# Patient Record
Sex: Female | Born: 1965 | Race: Black or African American | Hispanic: No | Marital: Single | Smoking: Current every day smoker
Health system: Southern US, Community
[De-identification: ages and names within clinical notes are randomized; demographics above are authoritative.]

## PROBLEM LIST (undated history)

## (undated) DIAGNOSIS — E785 Hyperlipidemia, unspecified: Secondary | ICD-10-CM

## (undated) DIAGNOSIS — I219 Acute myocardial infarction, unspecified: Secondary | ICD-10-CM

## (undated) DIAGNOSIS — F1011 Alcohol abuse, in remission: Secondary | ICD-10-CM

## (undated) DIAGNOSIS — Z87442 Personal history of urinary calculi: Secondary | ICD-10-CM

## (undated) DIAGNOSIS — Z720 Tobacco use: Secondary | ICD-10-CM

## (undated) DIAGNOSIS — Z978 Presence of other specified devices: Secondary | ICD-10-CM

## (undated) DIAGNOSIS — K219 Gastro-esophageal reflux disease without esophagitis: Secondary | ICD-10-CM

## (undated) DIAGNOSIS — F419 Anxiety disorder, unspecified: Secondary | ICD-10-CM

## (undated) DIAGNOSIS — D649 Anemia, unspecified: Secondary | ICD-10-CM

## (undated) DIAGNOSIS — R011 Cardiac murmur, unspecified: Secondary | ICD-10-CM

## (undated) DIAGNOSIS — T884XXA Failed or difficult intubation, initial encounter: Secondary | ICD-10-CM

## (undated) DIAGNOSIS — F329 Major depressive disorder, single episode, unspecified: Secondary | ICD-10-CM

## (undated) DIAGNOSIS — N200 Calculus of kidney: Secondary | ICD-10-CM

## (undated) DIAGNOSIS — K5900 Constipation, unspecified: Secondary | ICD-10-CM

## (undated) DIAGNOSIS — I2510 Atherosclerotic heart disease of native coronary artery without angina pectoris: Secondary | ICD-10-CM

## (undated) DIAGNOSIS — F32A Depression, unspecified: Secondary | ICD-10-CM

## (undated) DIAGNOSIS — F319 Bipolar disorder, unspecified: Secondary | ICD-10-CM

## (undated) DIAGNOSIS — I10 Essential (primary) hypertension: Secondary | ICD-10-CM

## (undated) HISTORY — PX: TUBAL LIGATION: SHX77

## (undated) HISTORY — DX: Hyperlipidemia, unspecified: E78.5

## (undated) HISTORY — PX: CARDIAC CATHETERIZATION: SHX172

## (undated) HISTORY — DX: Gastro-esophageal reflux disease without esophagitis: K21.9

## (undated) HISTORY — PX: CORONARY ANGIOPLASTY WITH STENT PLACEMENT: SHX49

## (undated) HISTORY — DX: Alcohol abuse, in remission: F10.11

## (undated) HISTORY — DX: Atherosclerotic heart disease of native coronary artery without angina pectoris: I25.10

## (undated) HISTORY — DX: Bipolar disorder, unspecified: F31.9

## (undated) HISTORY — DX: Essential (primary) hypertension: I10

## (undated) HISTORY — DX: Presence of other specified devices: Z97.8

## (undated) HISTORY — DX: Tobacco use: Z72.0

---

## 2005-10-03 ENCOUNTER — Emergency Department (HOSPITAL_COMMUNITY): Admission: EM | Admit: 2005-10-03 | Discharge: 2005-10-04 | Payer: Self-pay | Admitting: Emergency Medicine

## 2009-07-02 ENCOUNTER — Emergency Department (HOSPITAL_COMMUNITY): Admission: EM | Admit: 2009-07-02 | Discharge: 2009-07-02 | Payer: Self-pay | Admitting: Family Medicine

## 2009-09-23 ENCOUNTER — Emergency Department (HOSPITAL_COMMUNITY): Admission: EM | Admit: 2009-09-23 | Discharge: 2009-09-23 | Payer: Self-pay | Admitting: Emergency Medicine

## 2009-11-09 ENCOUNTER — Emergency Department (HOSPITAL_COMMUNITY): Admission: EM | Admit: 2009-11-09 | Discharge: 2009-11-09 | Payer: Self-pay | Admitting: Emergency Medicine

## 2010-02-08 DIAGNOSIS — I219 Acute myocardial infarction, unspecified: Secondary | ICD-10-CM

## 2010-02-08 HISTORY — DX: Acute myocardial infarction, unspecified: I21.9

## 2010-03-01 ENCOUNTER — Encounter: Payer: Self-pay | Admitting: Obstetrics

## 2010-03-01 ENCOUNTER — Emergency Department (HOSPITAL_COMMUNITY)
Admission: EM | Admit: 2010-03-01 | Discharge: 2010-03-01 | Payer: Self-pay | Source: Home / Self Care | Admitting: Emergency Medicine

## 2010-03-03 LAB — DIFFERENTIAL
Basophils Absolute: 0 10*3/uL (ref 0.0–0.1)
Basophils Relative: 1 % (ref 0–1)
Eosinophils Absolute: 0.2 10*3/uL (ref 0.0–0.7)
Eosinophils Relative: 3 % (ref 0–5)
Lymphocytes Relative: 26 % (ref 12–46)
Lymphs Abs: 1.7 10*3/uL (ref 0.7–4.0)
Monocytes Absolute: 0.5 10*3/uL (ref 0.1–1.0)
Monocytes Relative: 8 % (ref 3–12)
Neutro Abs: 4.1 10*3/uL (ref 1.7–7.7)
Neutrophils Relative %: 63 % (ref 43–77)

## 2010-03-03 LAB — CBC
HCT: 36.5 % (ref 36.0–46.0)
Hemoglobin: 11.6 g/dL — ABNORMAL LOW (ref 12.0–15.0)
MCH: 27.7 pg (ref 26.0–34.0)
MCHC: 31.8 g/dL (ref 30.0–36.0)
MCV: 87.1 fL (ref 78.0–100.0)
Platelets: 353 10*3/uL (ref 150–400)
RBC: 4.19 MIL/uL (ref 3.87–5.11)
RDW: 14.8 % (ref 11.5–15.5)
WBC: 6.6 10*3/uL (ref 4.0–10.5)

## 2010-08-27 ENCOUNTER — Emergency Department (HOSPITAL_COMMUNITY): Payer: Self-pay

## 2010-08-27 ENCOUNTER — Emergency Department (HOSPITAL_COMMUNITY)
Admission: EM | Admit: 2010-08-27 | Discharge: 2010-08-27 | Disposition: A | Discharge status: Home / Self Care | Payer: Self-pay | Attending: Emergency Medicine | Admitting: Emergency Medicine

## 2010-08-27 DIAGNOSIS — K2990 Gastroduodenitis, unspecified, without bleeding: Secondary | ICD-10-CM | POA: Insufficient documentation

## 2010-08-27 DIAGNOSIS — K2970 Gastritis, unspecified, without bleeding: Secondary | ICD-10-CM | POA: Insufficient documentation

## 2010-08-27 DIAGNOSIS — R079 Chest pain, unspecified: Secondary | ICD-10-CM | POA: Insufficient documentation

## 2010-08-27 DIAGNOSIS — R1013 Epigastric pain: Secondary | ICD-10-CM | POA: Insufficient documentation

## 2010-08-27 DIAGNOSIS — I10 Essential (primary) hypertension: Secondary | ICD-10-CM | POA: Insufficient documentation

## 2010-08-27 DIAGNOSIS — F988 Other specified behavioral and emotional disorders with onset usually occurring in childhood and adolescence: Secondary | ICD-10-CM | POA: Insufficient documentation

## 2010-08-27 DIAGNOSIS — Z79899 Other long term (current) drug therapy: Secondary | ICD-10-CM | POA: Insufficient documentation

## 2010-08-27 DIAGNOSIS — F319 Bipolar disorder, unspecified: Secondary | ICD-10-CM | POA: Insufficient documentation

## 2010-08-27 LAB — CBC
HCT: 35.3 % — ABNORMAL LOW (ref 36.0–46.0)
Hemoglobin: 11.7 g/dL — ABNORMAL LOW (ref 12.0–15.0)
MCH: 27.3 pg (ref 26.0–34.0)
MCHC: 33.1 g/dL (ref 30.0–36.0)
MCV: 82.5 fL (ref 78.0–100.0)
Platelets: 299 10*3/uL (ref 150–400)
RBC: 4.28 MIL/uL (ref 3.87–5.11)
RDW: 16 % — ABNORMAL HIGH (ref 11.5–15.5)
WBC: 6.7 10*3/uL (ref 4.0–10.5)

## 2010-08-27 LAB — DIFFERENTIAL
Basophils Absolute: 0 10*3/uL (ref 0.0–0.1)
Basophils Relative: 0 % (ref 0–1)
Eosinophils Absolute: 0.2 10*3/uL (ref 0.0–0.7)
Eosinophils Relative: 3 % (ref 0–5)
Lymphocytes Relative: 25 % (ref 12–46)
Lymphs Abs: 1.6 10*3/uL (ref 0.7–4.0)
Monocytes Absolute: 0.6 10*3/uL (ref 0.1–1.0)
Monocytes Relative: 9 % (ref 3–12)
Neutro Abs: 4.2 10*3/uL (ref 1.7–7.7)
Neutrophils Relative %: 64 % (ref 43–77)

## 2010-08-27 LAB — CK TOTAL AND CKMB (NOT AT ARMC)
CK, MB: 1.3 ng/mL (ref 0.3–4.0)
Relative Index: INVALID (ref 0.0–2.5)
Total CK: 95 U/L (ref 7–177)

## 2010-08-27 LAB — POCT I-STAT, CHEM 8
BUN: 8 mg/dL (ref 6–23)
Calcium, Ion: 1.24 mmol/L (ref 1.12–1.32)
Chloride: 106 mEq/L (ref 96–112)
Creatinine, Ser: 0.7 mg/dL (ref 0.50–1.10)
Glucose, Bld: 96 mg/dL (ref 70–99)
HCT: 38 % (ref 36.0–46.0)
Hemoglobin: 12.9 g/dL (ref 12.0–15.0)
Potassium: 3.9 mEq/L (ref 3.5–5.1)
Sodium: 138 mEq/L (ref 135–145)
TCO2: 21 mmol/L (ref 0–100)

## 2010-08-27 LAB — HEPATIC FUNCTION PANEL
ALT: 9 U/L (ref 0–35)
AST: 15 U/L (ref 0–37)
Albumin: 3.4 g/dL — ABNORMAL LOW (ref 3.5–5.2)
Alkaline Phosphatase: 60 U/L (ref 39–117)
Bilirubin, Direct: 0.1 mg/dL (ref 0.0–0.3)
Indirect Bilirubin: 0.1 mg/dL — ABNORMAL LOW (ref 0.3–0.9)
Total Bilirubin: 0.2 mg/dL — ABNORMAL LOW (ref 0.3–1.2)
Total Protein: 7.1 g/dL (ref 6.0–8.3)

## 2010-08-27 LAB — TROPONIN I: Troponin I: <0.3 ng/mL (ref <0.30)

## 2010-08-27 LAB — LIPASE, BLOOD: Lipase: 52 U/L (ref 11–59)

## 2010-08-27 LAB — POCT PREGNANCY, URINE: Preg Test, Ur: NEGATIVE

## 2010-09-01 ENCOUNTER — Emergency Department (HOSPITAL_COMMUNITY)
Admission: EM | Admit: 2010-09-01 | Discharge: 2010-09-02 | Disposition: A | Discharge status: Other Acute Inpatient Hospital | Payer: Self-pay | Attending: Emergency Medicine | Admitting: Emergency Medicine

## 2010-09-01 DIAGNOSIS — R45851 Suicidal ideations: Secondary | ICD-10-CM | POA: Insufficient documentation

## 2010-09-01 DIAGNOSIS — Z79899 Other long term (current) drug therapy: Secondary | ICD-10-CM | POA: Insufficient documentation

## 2010-09-01 DIAGNOSIS — I10 Essential (primary) hypertension: Secondary | ICD-10-CM | POA: Insufficient documentation

## 2010-09-01 DIAGNOSIS — F319 Bipolar disorder, unspecified: Secondary | ICD-10-CM | POA: Insufficient documentation

## 2010-09-01 LAB — DIFFERENTIAL
Basophils Absolute: 0 10*3/uL (ref 0.0–0.1)
Basophils Relative: 0 % (ref 0–1)
Eosinophils Absolute: 0.2 10*3/uL (ref 0.0–0.7)
Eosinophils Relative: 2 % (ref 0–5)
Lymphocytes Relative: 29 % (ref 12–46)
Lymphs Abs: 2.3 10*3/uL (ref 0.7–4.0)
Monocytes Absolute: 0.5 10*3/uL (ref 0.1–1.0)
Monocytes Relative: 7 % (ref 3–12)
Neutro Abs: 4.9 10*3/uL (ref 1.7–7.7)
Neutrophils Relative %: 61 % (ref 43–77)

## 2010-09-01 LAB — CBC
HCT: 38.8 % (ref 36.0–46.0)
Hemoglobin: 12.8 g/dL (ref 12.0–15.0)
MCH: 27.4 pg (ref 26.0–34.0)
MCHC: 33 g/dL (ref 30.0–36.0)
MCV: 83.1 fL (ref 78.0–100.0)
Platelets: 344 10*3/uL (ref 150–400)
RBC: 4.67 MIL/uL (ref 3.87–5.11)
RDW: 16.1 % — ABNORMAL HIGH (ref 11.5–15.5)
WBC: 7.9 10*3/uL (ref 4.0–10.5)

## 2010-09-01 LAB — URINALYSIS, ROUTINE W REFLEX MICROSCOPIC
Glucose, UA: NEGATIVE mg/dL
Nitrite: NEGATIVE
Protein, ur: 30 mg/dL — AB
Specific Gravity, Urine: 1.023 (ref 1.005–1.030)
Urobilinogen, UA: 0.2 mg/dL (ref 0.0–1.0)
pH: 5.5 (ref 5.0–8.0)

## 2010-09-01 LAB — URINE MICROSCOPIC-ADD ON

## 2010-09-01 LAB — RAPID URINE DRUG SCREEN, HOSP PERFORMED
Amphetamines: NOT DETECTED
Barbiturates: POSITIVE — AB
Benzodiazepines: POSITIVE — AB
Cocaine: NOT DETECTED
Opiates: NOT DETECTED
Tetrahydrocannabinol: NOT DETECTED

## 2010-09-01 LAB — BASIC METABOLIC PANEL
BUN: 7 mg/dL (ref 6–23)
CO2: 23 mEq/L (ref 19–32)
Calcium: 9.5 mg/dL (ref 8.4–10.5)
Chloride: 103 mEq/L (ref 96–112)
Creatinine, Ser: 0.72 mg/dL (ref 0.50–1.10)
GFR calc Af Amer: >60 mL/min (ref >60)
GFR calc non Af Amer: >60 mL/min (ref >60)
Glucose, Bld: 95 mg/dL (ref 70–99)
Potassium: 3.8 mEq/L (ref 3.5–5.1)
Sodium: 136 mEq/L (ref 135–145)

## 2010-09-01 LAB — ETHANOL: Alcohol, Ethyl (B): <11 mg/dL (ref 0–11)

## 2010-09-01 LAB — POCT PREGNANCY, URINE: Preg Test, Ur: NEGATIVE

## 2010-09-02 ENCOUNTER — Inpatient Hospital Stay (HOSPITAL_COMMUNITY)
Admission: AD | Admit: 2010-09-02 | Discharge: 2010-09-08 | DRG: 897 | Disposition: A | Discharge status: Home / Self Care | Payer: Self-pay | Source: Ambulatory Visit | Attending: Psychiatry | Admitting: Psychiatry

## 2010-09-02 DIAGNOSIS — I10 Essential (primary) hypertension: Secondary | ICD-10-CM

## 2010-09-02 DIAGNOSIS — F3289 Other specified depressive episodes: Secondary | ICD-10-CM

## 2010-09-02 DIAGNOSIS — R45851 Suicidal ideations: Secondary | ICD-10-CM

## 2010-09-02 DIAGNOSIS — F329 Major depressive disorder, single episode, unspecified: Secondary | ICD-10-CM

## 2010-09-02 DIAGNOSIS — F1320 Sedative, hypnotic or anxiolytic dependence, uncomplicated: Secondary | ICD-10-CM

## 2010-09-02 DIAGNOSIS — F1120 Opioid dependence, uncomplicated: Principal | ICD-10-CM

## 2010-09-02 DIAGNOSIS — N390 Urinary tract infection, site not specified: Secondary | ICD-10-CM

## 2010-09-02 DIAGNOSIS — F1994 Other psychoactive substance use, unspecified with psychoactive substance-induced mood disorder: Secondary | ICD-10-CM

## 2010-09-03 ENCOUNTER — Inpatient Hospital Stay (HOSPITAL_COMMUNITY): Payer: Self-pay

## 2010-09-03 ENCOUNTER — Ambulatory Visit (HOSPITAL_COMMUNITY)
Admission: EM | Admit: 2010-09-03 | Discharge: 2010-09-03 | Disposition: A | Discharge status: Home / Self Care | Payer: Self-pay | Source: Other Acute Inpatient Hospital | Attending: Emergency Medicine | Admitting: Emergency Medicine

## 2010-09-03 DIAGNOSIS — F1910 Other psychoactive substance abuse, uncomplicated: Secondary | ICD-10-CM

## 2010-09-03 DIAGNOSIS — M47817 Spondylosis without myelopathy or radiculopathy, lumbosacral region: Secondary | ICD-10-CM | POA: Insufficient documentation

## 2010-09-03 DIAGNOSIS — F319 Bipolar disorder, unspecified: Secondary | ICD-10-CM | POA: Insufficient documentation

## 2010-09-03 DIAGNOSIS — F1994 Other psychoactive substance use, unspecified with psychoactive substance-induced mood disorder: Secondary | ICD-10-CM

## 2010-09-03 DIAGNOSIS — I10 Essential (primary) hypertension: Secondary | ICD-10-CM | POA: Insufficient documentation

## 2010-09-03 DIAGNOSIS — M545 Low back pain, unspecified: Secondary | ICD-10-CM | POA: Insufficient documentation

## 2010-09-03 DIAGNOSIS — F1320 Sedative, hypnotic or anxiolytic dependence, uncomplicated: Secondary | ICD-10-CM

## 2010-09-03 DIAGNOSIS — F1120 Opioid dependence, uncomplicated: Secondary | ICD-10-CM

## 2010-09-03 DIAGNOSIS — Z79899 Other long term (current) drug therapy: Secondary | ICD-10-CM | POA: Insufficient documentation

## 2010-09-03 DIAGNOSIS — Z87442 Personal history of urinary calculi: Secondary | ICD-10-CM | POA: Insufficient documentation

## 2010-09-03 LAB — URINE CULTURE
Colony Count: NO GROWTH
Culture  Setup Time: 201207250405
Culture: NO GROWTH

## 2010-09-04 NOTE — Assessment & Plan Note (Signed)
[name], [name]             ACCOUNT NO.:  [id number]  MEDICAL RECORD NO.:  [id number]  LOCATION:  0301                          FACILITY:  [facility]  PHYSICIAN:  Franchot Gallo, MD     DATE OF BIRTH:  07-23-1965  DATE OF ADMISSION:  09/02/2010 DATE OF DISCHARGE:  09/03/2010                      PSYCHIATRIC ADMISSION ASSESSMENT   IDENTIFYING INFORMATION:  This is a 45 year old female that was involuntary petitioned on September 02, 2010.  Her petition papers stated the patient has been off medications for 3 weeks, feeling that she is useless and told her children that they would be better off without her, very depressed and having suicidal thoughts to cut her wrist.  The patient states that her trigger was that she had her power cut off.  She had called her brothers who told her that "they did not have a sister" which got her very upset.  She went to a bathroom and had a box cutter. Her daughter called the police.  She also reports using Vicodin and Percocet for approximately 1 year and has been using sleeping aids for several years.  She also has been starting to use Xanax.  PAST PSYCHIATRIC HISTORY:  First admission to [facility]. Reports a history of bipolar, has been on medications approximately 20 years, is currently a client of [facility].  SOCIAL HISTORY:  This is a 45 year old single female.  She resides in [place] with her two children ages 64 and 61.  She also has a 3-year- old granddaughter that lives her.  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  FAMILY HISTORY:  None.  ALCOHOL/DRUG HISTORY:  She reports being sober from alcohol for 15 years.  Denies any IV drug use, but has been using Vicodin, Percocet and Xanax.  PRIMARY CARE PROVIDER:  None.  MEDICAL PROBLEMS:  Denies any acute or chronic health issues.  She currently however, has a urinary tract infection and was  prescribed Macrodantin in the emergency room.  MEDICATION: 1. Has been on Seroquel 100 mg at bedtime for sleep. 2. Effexor which she stopped due to suicidal thinking. 3. Currently on Lexapro which she states is very beneficial. 4. Strattera that she uses while she attends classes.  DRUG ALLERGIES:  No known allergies.  PHYSICAL EXAMINATION:  This is a normally-developed female.  She is having some back aches and feeling irritable.  Her urine drug screen was positive for benzodiazepines, positive for barbiturates.  Her urine shows 21-30 WBCs.  Her alcohol level was less than 11.  Her BMET was within normal limits.  MENTAL STATUS EXAM:  The patient was resting in bed currently dressed in a hospital scrub, little eye contact and seemed very angry.  At the beginning of the interview she does report that she knows she feels angry and apologizes for behaving like she is.  She tells me that she feels now that it is probably related to her substance use and wanting help.  Her speech is clear.  She became more pleasant and cooperative as the interview progressed.  Thought processes are coherent, goal directed.  Denies any suicidal thoughts or psychotic symptoms.  Her  memory appears intact.  Judgment and insight are fair with improving insight.  AXIS I:  Opiate dependence, benzodiazepine dependence, substance induced mood disorder. AXIS II:  Deferred. AXIS III:  Urinary tract infection. AXIS IV:  Substance use, financial issues, recent problems with electricity being turned off. AXIS V:  40.  Our plan is to put patient on both the clonidine and the Librium protocol.  We will resume her Lexapro and Seroquel for sleep and mood stabilization.  Will continue to assess her motivation for rehab.  The patient is interested at this time going to AA and NA meetings.  Will contact her work.  Her tentative length of stay at this time is 3-4 days.     Landry Corporal,  N.P.   ______________________________ Franchot Gallo, MD    JO/MEDQ  D:  09/04/2010  T:  09/04/2010  Job:  [id number]  Electronically Signed by Limmie PatriciaP. on 09/04/2010 10:53:06 AM Electronically Signed by Franchot Gallo MD on 09/04/2010 04:37:47 PM

## 2010-09-07 LAB — URINALYSIS, MICROSCOPIC ONLY
Bilirubin Urine: NEGATIVE
Glucose, UA: NEGATIVE mg/dL
Hgb urine dipstick: NEGATIVE
Ketones, ur: NEGATIVE mg/dL
Nitrite: NEGATIVE
Protein, ur: NEGATIVE mg/dL
Specific Gravity, Urine: 1.018 (ref 1.005–1.030)
Urobilinogen, UA: 0.2 mg/dL (ref 0.0–1.0)
pH: 5.5 (ref 5.0–8.0)

## 2010-09-08 LAB — URINE CULTURE
Colony Count: NO GROWTH
Culture  Setup Time: 201207300940
Culture: NO GROWTH
Special Requests: POSITIVE

## 2010-09-08 LAB — GC/CHLAMYDIA PROBE AMP, URINE
Chlamydia, Swab/Urine, PCR: NEGATIVE
GC Probe Amp, Urine: NEGATIVE

## 2010-09-24 NOTE — Discharge Summary (Signed)
NAME:  [name], [name]                ACCOUNT NO.:  [id number]  MEDICAL RECORD NO.:  [id number]  LOCATION:  0301                          [facility]ACILITY[facility]  [facility]  PHYSICIAN:  Orson Aloe, MD       DATE OF BIRTH:  [birth year]  DATE OF ADMISSION:  09/02/2010 DATE OF DISCHARGE:  09/08/2010                              DISCHARGE SUMMARY   The patient is a 45 year old female that is involuntary petition on July 25.  Her petition papers stated the patient has been off medications for 3 weeks, feeling that she is useless and told her children that they would be better off without her, very depressed, having suicidal thoughts, cut her wrist.  The patient deemed her triggers were that she had her power cut off.  She called her brother who told her that they did not have a sister which got her very upset.  She went to the bathroom and used a box cutter.  The daughter called the police.  She also reports having Vicodin and Percocet for approximately 1 year, and has been using sleeping aids for several years.  She has been starting to use Xanax.  SIGNIFICANT LABORATORY:  Her urine screen was positive for benzodiazepines and barbiturates.  Her urine shows 21-30 WBCs.  Her alcohol level was less than 11.  Her BMET was within normal limits.  FINAL DIAGNOSES:  Axis I:  Opiate dependence, substance induced mood disorder. Axis II:  Deferred. Axis III:  Urinary tract infection, elevated blood pressure. Axis IV:  Financial issues, moderate. Axis V:  GAF is 60.  SIGNIFICANT FINDINGS ON ADMISSION:  She was noted to be struggling with opiate withdrawal, was placed on a clonidine taper that was noted to be effective.  In the morning groups, she had good participation.  She had been involved in AA and wants to start being involved in NA as well. Possible IOP referral was contemplated.  She was continued on the detox and noted to be doing fairly well.  At one point, she was curled up in bed and not participating  in group stating that she was getting her electricity turned back on, and if the 81-year-old grand-daughter has been taken by sitters to DSS.  She would like to get her electricity turned back on.  She reports that she was able to get to sleep easily. She was told by her roommate that she is a very loud snorer.  She was told this 5 years ago by a person who lived in her house.  She was informed of the consequences of her not getting properly diagnosed for her potential sleep apnea.  She was interested in trying to follow up on that.  She described acid reflux which has responded in the past to Prilosec, and that was restarted.  She was noted to have a white cottage cheese type vaginal discharge, and for that Diflucan was ordered, and having no bowel movements since admission, was placed on Colace with good results noted.  CONDITION ON DISCHARGE:  This is a 45 year old [place] American female from [place], [place].  She denied any suicidal or homicidal ideation, hallucinations, illusions or delusions, had clear coherent thoughts,  and good eye contact, had natural conversational speech with rate, volume and tone, had good recent and remote memory.  Mood on a scale from 1 being the least, 10 the most, is a 1, and anxiety on a scale from 1 the least to 10 the most, is a 2.  ACTIVITY:  Resume typical activities.  DIET:  Resume typical diet.  DISCHARGE INSTRUCTIONS:  Follow up at [facility] on August 2 at 8:00 a.m. and go to the agency on [address] to see if she can get in for her health care followup, health services __________ apartments on [address].  DISCHARGE MEDICATIONS: 1. Lexapro 20 mg. 2. Robaxin 500 mg at bedtime. 3. Naprosyn 500 mg twice a day. 4. Nicotine 21 mg patches for smoking cessation. 5. Nitrofurantoin 50 mg capsules. 6. Macrodantin 4 times a day with meals.          ______________________________ Orson Aloe, MD     EW/MEDQ  D:  09/22/2010  T:   09/23/2010  Job:  [id number]  Electronically Signed by Orson Aloe  on 09/24/2010 12:12:14 PM

## 2010-09-26 ENCOUNTER — Emergency Department (HOSPITAL_COMMUNITY): Payer: Medicaid Other

## 2010-09-26 ENCOUNTER — Inpatient Hospital Stay (HOSPITAL_COMMUNITY)
Admission: EM | Admit: 2010-09-26 | Discharge: 2010-09-30 | DRG: 247 | Disposition: A | Discharge status: Home / Self Care | Payer: Medicaid Other | Attending: Cardiology | Admitting: Cardiology

## 2010-09-26 DIAGNOSIS — E785 Hyperlipidemia, unspecified: Secondary | ICD-10-CM | POA: Diagnosis present

## 2010-09-26 DIAGNOSIS — F319 Bipolar disorder, unspecified: Secondary | ICD-10-CM | POA: Diagnosis present

## 2010-09-26 DIAGNOSIS — F17200 Nicotine dependence, unspecified, uncomplicated: Secondary | ICD-10-CM | POA: Diagnosis present

## 2010-09-26 DIAGNOSIS — K219 Gastro-esophageal reflux disease without esophagitis: Secondary | ICD-10-CM | POA: Diagnosis present

## 2010-09-26 DIAGNOSIS — Z7982 Long term (current) use of aspirin: Secondary | ICD-10-CM

## 2010-09-26 DIAGNOSIS — F1110 Opioid abuse, uncomplicated: Secondary | ICD-10-CM | POA: Diagnosis present

## 2010-09-26 DIAGNOSIS — I214 Non-ST elevation (NSTEMI) myocardial infarction: Principal | ICD-10-CM | POA: Diagnosis present

## 2010-09-26 DIAGNOSIS — F1010 Alcohol abuse, uncomplicated: Secondary | ICD-10-CM | POA: Diagnosis present

## 2010-09-26 DIAGNOSIS — I2510 Atherosclerotic heart disease of native coronary artery without angina pectoris: Secondary | ICD-10-CM | POA: Diagnosis present

## 2010-09-26 DIAGNOSIS — I10 Essential (primary) hypertension: Secondary | ICD-10-CM | POA: Diagnosis present

## 2010-09-26 DIAGNOSIS — R079 Chest pain, unspecified: Secondary | ICD-10-CM

## 2010-09-26 LAB — COMPREHENSIVE METABOLIC PANEL
ALT: 13 U/L (ref 0–35)
AST: 45 U/L — ABNORMAL HIGH (ref 0–37)
Albumin: 3.8 g/dL (ref 3.5–5.2)
Alkaline Phosphatase: 68 U/L (ref 39–117)
BUN: 7 mg/dL (ref 6–23)
CO2: 21 mEq/L (ref 19–32)
Calcium: 9.5 mg/dL (ref 8.4–10.5)
Chloride: 104 mEq/L (ref 96–112)
Creatinine, Ser: 0.67 mg/dL (ref 0.50–1.10)
GFR calc Af Amer: >60 mL/min (ref >60)
GFR calc non Af Amer: >60 mL/min (ref >60)
Glucose, Bld: 104 mg/dL — ABNORMAL HIGH (ref 70–99)
Potassium: 4 mEq/L (ref 3.5–5.1)
Sodium: 136 mEq/L (ref 135–145)
Total Bilirubin: 0.2 mg/dL — ABNORMAL LOW (ref 0.3–1.2)
Total Protein: 7.6 g/dL (ref 6.0–8.3)

## 2010-09-26 LAB — DIFFERENTIAL
Basophils Absolute: 0 10*3/uL (ref 0.0–0.1)
Basophils Relative: 0 % (ref 0–1)
Eosinophils Absolute: 0.1 10*3/uL (ref 0.0–0.7)
Eosinophils Relative: 2 % (ref 0–5)
Lymphocytes Relative: 15 % (ref 12–46)
Lymphs Abs: 1.3 10*3/uL (ref 0.7–4.0)
Monocytes Absolute: 0.7 10*3/uL (ref 0.1–1.0)
Monocytes Relative: 9 % (ref 3–12)
Neutro Abs: 6.1 10*3/uL (ref 1.7–7.7)
Neutrophils Relative %: 74 % (ref 43–77)

## 2010-09-26 LAB — POCT I-STAT TROPONIN I: Troponin i, poc: 2.03 ng/mL (ref 0.00–0.08)

## 2010-09-26 LAB — CBC
HCT: 40.4 % (ref 36.0–46.0)
Hemoglobin: 13.4 g/dL (ref 12.0–15.0)
MCH: 27.5 pg (ref 26.0–34.0)
MCHC: 33.2 g/dL (ref 30.0–36.0)
MCV: 83 fL (ref 78.0–100.0)
Platelets: 314 10*3/uL (ref 150–400)
RBC: 4.87 MIL/uL (ref 3.87–5.11)
RDW: 15.8 % — ABNORMAL HIGH (ref 11.5–15.5)
WBC: 8.2 10*3/uL (ref 4.0–10.5)

## 2010-09-26 LAB — PROTIME-INR
INR: 0.98 (ref 0.00–1.49)
Prothrombin Time: 13.2 seconds (ref 11.6–15.2)

## 2010-09-26 LAB — CARDIAC PANEL(CRET KIN+CKTOT+MB+TROPI)
CK, MB: 232.2 ng/mL (ref 0.3–4.0)
Relative Index: 14.1 — ABNORMAL HIGH (ref 0.0–2.5)
Total CK: 1646 U/L — ABNORMAL HIGH (ref 7–177)
Troponin I: 7.69 ng/mL (ref <0.30)

## 2010-09-26 LAB — CK TOTAL AND CKMB (NOT AT ARMC)
CK, MB: 61.8 ng/mL (ref 0.3–4.0)
Relative Index: 10.8 — ABNORMAL HIGH (ref 0.0–2.5)
Total CK: 572 U/L — ABNORMAL HIGH (ref 7–177)

## 2010-09-26 LAB — TROPONIN I: Troponin I: 3.38 ng/mL (ref <0.30)

## 2010-09-26 LAB — LIPASE, BLOOD: Lipase: 57 U/L (ref 11–59)

## 2010-09-26 LAB — HEPARIN LEVEL (UNFRACTIONATED): Heparin Unfractionated: 0.33 IU/mL (ref 0.30–0.70)

## 2010-09-26 LAB — MRSA PCR SCREENING: MRSA by PCR: NEGATIVE

## 2010-09-26 LAB — APTT: aPTT: 30 seconds (ref 24–37)

## 2010-09-27 LAB — CARDIAC PANEL(CRET KIN+CKTOT+MB+TROPI)
CK, MB: 206.8 ng/mL (ref 0.3–4.0)
CK, MB: 118.5 ng/mL (ref 0.3–4.0)
Relative Index: 11.7 — ABNORMAL HIGH (ref 0.0–2.5)
Relative Index: 8.9 — ABNORMAL HIGH (ref 0.0–2.5)
Total CK: 1761 U/L — ABNORMAL HIGH (ref 7–177)
Total CK: 1334 U/L — ABNORMAL HIGH (ref 7–177)
Troponin I: 20.32 ng/mL (ref <0.30)
Troponin I: 15.95 ng/mL (ref <0.30)

## 2010-09-27 LAB — LIPID PANEL
Cholesterol: 157 mg/dL (ref 0–200)
HDL: 44 mg/dL (ref >39)
LDL Cholesterol: 96 mg/dL (ref 0–99)
Total CHOL/HDL Ratio: 3.6 RATIO
Triglycerides: 85 mg/dL (ref <150)
VLDL: 17 mg/dL (ref 0–40)

## 2010-09-27 LAB — CBC
HCT: 36.2 % (ref 36.0–46.0)
Hemoglobin: 11.8 g/dL — ABNORMAL LOW (ref 12.0–15.0)
MCH: 27.2 pg (ref 26.0–34.0)
MCHC: 32.6 g/dL (ref 30.0–36.0)
MCV: 83.4 fL (ref 78.0–100.0)
Platelets: 303 10*3/uL (ref 150–400)
RBC: 4.34 MIL/uL (ref 3.87–5.11)
RDW: 15.5 % (ref 11.5–15.5)
WBC: 12.8 10*3/uL — ABNORMAL HIGH (ref 4.0–10.5)

## 2010-09-27 LAB — HEPARIN LEVEL (UNFRACTIONATED): Heparin Unfractionated: 0.27 IU/mL — ABNORMAL LOW (ref 0.30–0.70)

## 2010-09-27 LAB — TSH: TSH: 1.017 u[IU]/mL (ref 0.350–4.500)

## 2010-09-28 DIAGNOSIS — I2510 Atherosclerotic heart disease of native coronary artery without angina pectoris: Secondary | ICD-10-CM

## 2010-09-28 LAB — CBC
HCT: 35.7 % — ABNORMAL LOW (ref 36.0–46.0)
Hemoglobin: 11.6 g/dL — ABNORMAL LOW (ref 12.0–15.0)
MCH: 27.2 pg (ref 26.0–34.0)
MCHC: 32.5 g/dL (ref 30.0–36.0)
MCV: 83.6 fL (ref 78.0–100.0)
Platelets: 298 10*3/uL (ref 150–400)
RBC: 4.27 MIL/uL (ref 3.87–5.11)
RDW: 15.5 % (ref 11.5–15.5)
WBC: 16.1 10*3/uL — ABNORMAL HIGH (ref 4.0–10.5)

## 2010-09-28 LAB — POCT ACTIVATED CLOTTING TIME: Activated Clotting Time: 551 seconds

## 2010-09-28 LAB — HEPARIN LEVEL (UNFRACTIONATED): Heparin Unfractionated: 0.19 IU/mL — ABNORMAL LOW (ref 0.30–0.70)

## 2010-09-29 ENCOUNTER — Inpatient Hospital Stay (HOSPITAL_COMMUNITY): Payer: Medicaid Other

## 2010-09-29 DIAGNOSIS — I214 Non-ST elevation (NSTEMI) myocardial infarction: Secondary | ICD-10-CM

## 2010-09-29 DIAGNOSIS — R072 Precordial pain: Secondary | ICD-10-CM

## 2010-09-29 LAB — CBC
HCT: 33 % — ABNORMAL LOW (ref 36.0–46.0)
Hemoglobin: 10.8 g/dL — ABNORMAL LOW (ref 12.0–15.0)
MCH: 27.2 pg (ref 26.0–34.0)
MCHC: 32.7 g/dL (ref 30.0–36.0)
MCV: 83.1 fL (ref 78.0–100.0)
Platelets: 291 10*3/uL (ref 150–400)
RBC: 3.97 MIL/uL (ref 3.87–5.11)
RDW: 15.4 % (ref 11.5–15.5)
WBC: 14.2 10*3/uL — ABNORMAL HIGH (ref 4.0–10.5)

## 2010-09-29 LAB — BASIC METABOLIC PANEL
BUN: 5 mg/dL — ABNORMAL LOW (ref 6–23)
CO2: 24 mEq/L (ref 19–32)
Calcium: 8.8 mg/dL (ref 8.4–10.5)
Chloride: 106 mEq/L (ref 96–112)
Creatinine, Ser: 0.72 mg/dL (ref 0.50–1.10)
GFR calc Af Amer: >60 mL/min (ref >60)
GFR calc non Af Amer: >60 mL/min (ref >60)
Glucose, Bld: 123 mg/dL — ABNORMAL HIGH (ref 70–99)
Potassium: 3.3 mEq/L — ABNORMAL LOW (ref 3.5–5.1)
Sodium: 138 mEq/L (ref 135–145)

## 2010-09-29 NOTE — Cardiovascular Report (Signed)
NAMELashann, [name]                ACCOUNT NO.:  [id number]  MEDICAL RECORD NO.:  [id number]  LOCATION:  6533                         FACILITY:  [facility]  PHYSICIAN:  Verne Carrow, MDDATE OF BIRTH:  1965-10-07  DATE OF PROCEDURE:  09/28/2010 DATE OF DISCHARGE:                           CARDIAC CATHETERIZATION   PRIMARY CARDIOLOGIST:  Maisie Fus C. Wall, MD, [facility]  PROCEDURES PERFORMED: 1. PTCA with placement of a drug-eluting stent in the mid circumflex     artery. 2. PTCA with placement of a drug-eluting stent in the proximal right     coronary artery.  OPERATOR:  Verne Carrow, MD  INDICATIONS:  This is a 45 year old African American female with a history of hypertension, ongoing tobacco abuse, alcohol abuse, bipolar disorder, and substance abuse who was admitted to the hospital with chest pain and ruled in for myocardial infarction with serial cardiac enzymes.  The patient underwent diagnostic catheterization per Dr. Rollene Rotunda earlier today and was found to have a subtotally occluded midcircumflex artery as well as severe stenosis in the proximal right coronary artery.  Dr. Antoine Poche asked me to perform the intervention.  DETAILS OF PROCEDURE:  When I entered the case, the patient had a 5- [place] sheath present in the right radial artery.  Under sterile technique, I changed 5-French sheath for a 6-French sheath.  A 3 mg of verapamil was given through the sheath after the sheath change.  The patient was then given a bolus of Angiomax and the drip was started.  We then engaged the left main artery with an XB3.0 guiding catheter.  When the ACT was greater than 200, I passed a Cougar intracoronary wire down the length of the circumflex artery through the total occlusion and into the obtuse marginal branch.  A 2.0 x 15 mm balloon was used to predilate the lesion.  A 2.25 x 20 mm Promus element drug-eluting stent was carefully positioned and deployed in the mid  circumflex artery, extending down into the second obtuse marginal branch.  A 2.25 x 15 mm balloon was inflated twice within a stented segment.  The stenosis was taken from 100% subtotal occlusion down to 0%.  There was excellent flow into the distal vessel.  At this point, we turned our attention toward the stenosis in the right coronary artery.  A JR-4 guiding catheter was used to selectively engage the native right coronary artery.  A 2.5 x 15 mm balloon was inflated in the proximal segment down into the midportion of the vessel.  A 3.0 x 20 mm Promus Element drug-eluting stent was carefully positioned in the midvessel and was deployed without difficulty.  A 3.25 x 15 mm balloon was inflated twice within the stented segment.  The stenosis was taken from 80% down to 0%.  There was excellent result.  There was excellent flow into the distal vessel.  The patient had no immediate complications.  The sheath was removed here in the cath lab and Terumo hemostasis band was applied over the arteriotomy site.  IMPRESSION: 1. Successful percutaneous transluminal coronary angioplasty with     placement of a drug-eluting stent in the mid circumflex artery. 2. Successful percutaneous transluminal coronary angioplasty with  placement of a drug-eluting stent in the proximal right coronary     artery.  RECOMMENDATIONS:  The patient should be continued on aspirin and Effient for least 1 year.  We will also continue her beta-blocker and statin.     Verne Carrow, MD     CM/MEDQ  D:  09/28/2010  T:  09/29/2010  Job:  [id number]  Electronically Signed by Verne Carrow MD on 09/29/2010 05:09:27 PM

## 2010-09-29 NOTE — Cardiovascular Report (Signed)
  NAMEKaneisha, [name]                ACCOUNT NO.:  [id number]  MEDICAL RECORD NO.:  [id number]  LOCATION:  6533                         FACILITY:  [facility]  PHYSICIAN:  Rollene Rotunda, MD, FACCDATE OF BIRTH:  06/07/1965  DATE OF PROCEDURE:  09/28/2010 DATE OF DISCHARGE:                           CARDIAC CATHETERIZATION   PRIMARY CARE PROVIDER:  [facility].  CARDIOLOGIST:  Jesse Sans. Wall, MD, [facility]  PROCEDURE:  Left heart catheterization/coronary arteriography.  INDICATIONS:  Evaluate patient with non-Q-wave myocardial infarction.  PROCEDURE NOTE:  Left heart catheterization was performed via right radial artery.  The vessel was cannulated using anterior wall puncture. A #5-French arterial sheath was inserted via the modified Seldinger technique.  Preformed Judkins and pigtail catheter were utilized.  The patient tolerated the procedure well and left the lab in stable condition.  RESULTS:  Hemodynamics:  LV 128/10, AO 128/75. Coronaries:  Left main was normal.  The LAD had ostial 25% stenosis due to diffuse luminal irregularities.  There were diffuse 25-30% lesions in the mid and distal vessels.  There was a mid focal 40% lesion right after the takeoff of first diagonal.  First diagonal was small diffuse 30% stenosis.  The circumflex had a mid 99% stenosis with TIMI 2 flow after the first obtuse marginal.  There was some distal moderate disease before small posterolateral as well.  First obtuse marginal was large with proximal 25% stenosis.  Second obtuse marginal was moderate size without high-grade disease.  Posterolateral was small and free of high- grade disease.  The right coronary artery is dominant vessel.  There was proximal 80% stenosis.  There was a tandem 70% lesion.  There were diffuse 25% lesions.  PDA was large with ostial 25% stenosis. Left ventriculogram:  Left ventriculogram was obtained in the RAO projection.  EF was 60%.  CONCLUSION:  Severe two-vessel  coronary artery disease.  Diffuse plaque very advanced for her age.  She does have preserved ejection fraction.  PLAN:  The patient will have percutaneous revascularization, attempted of the circumflex and right coronary artery.  She needs aggressive risk reduction and lifestyle modification.  She needs adherence of medications.  She needs extensive education.     Rollene Rotunda, MD, [facility]     JH/MEDQ  D:  09/28/2010  T:  09/29/2010  Job:  [id number]  cc:   [facility]  Electronically Signed by Rollene Rotunda MD [facility] on 09/29/2010 02:23:44 PM

## 2010-09-30 LAB — URINE CULTURE
Colony Count: NO GROWTH
Culture  Setup Time: 201208220141
Culture: NO GROWTH
Special Requests: NEGATIVE

## 2010-09-30 LAB — URINE MICROSCOPIC-ADD ON

## 2010-09-30 LAB — URINALYSIS, ROUTINE W REFLEX MICROSCOPIC
Bilirubin Urine: NEGATIVE
Glucose, UA: NEGATIVE mg/dL
Ketones, ur: NEGATIVE mg/dL
Nitrite: NEGATIVE
Protein, ur: 30 mg/dL — AB
Specific Gravity, Urine: 1.011 (ref 1.005–1.030)
Urobilinogen, UA: 1 mg/dL (ref 0.0–1.0)
pH: 7 (ref 5.0–8.0)

## 2010-09-30 NOTE — H&P (Signed)
NAME:  [name], [name]                ACCOUNT NO.:  [id number]  MEDICAL RECORD NO.:  [id number]  LOCATION:  MCED                         FACILITY:  [facility]  PHYSICIAN:  Jesse Sans. Manus Weedman, MD, FACCDATE OF BIRTH:  [birth year]  DATE OF ADMISSION:  09/26/2010 DATE OF DISCHARGE:                             HISTORY & PHYSICAL   CHIEF COMPLAINT:  Chest pressure tightness starting yesterday by 3 o'clock in the afternoon.  HISTORY OF PRESENT ILLNESS:  [name] is a 45 year old single African American female with no previous cardiac history.  She does smoke heavily and has a long history of hypertension.  She also has a bipolar disorder and history of substance abuse with opiates and benzodiazepines.  There is remote alcohol abuse history.  She started hurting in her chest on Thursday, 2 days ago.  It was intermittent responding somewhat to some over-the-counter Prilosec and Zantac.  About 3 o'clock yesterday afternoon she began having discomfort again. She basically has consistently hurt since then.  This morning it got worse.  It was pressure heaviness sounding something sitting on her chest that she describes it.  It went up into her neck and down into her shoulder.  There was some queasiness, no vomiting. She became mildly diaphoretic.  She was mildly short of breath.  She is extremely anxious in the ED.  Here she received four baby aspirin and IV nitroglycerin and heparin. When I arrived, she was still having about 4/10 chest pain.  Heart rate was in the 80s, sinus rhythm and her blood pressure was still high.  I have titrated her nitroglycerin up, given her intravenous Lopressor 5 mg x2.  Her pressures came down and her discomfort now is 0-1/10.  Her EKG shows no ST-segment elevation.  Compared to previous EKG in July 2012, she has some ST-segment depression inferiorly with T-wave inversion and also in V6.  Her initial set of markers were positive.  CPK is 572, MB 61.8, index  of 10.8 and her troponin was 3.38.  PT/PTT are normal.  BMET normal. Lipase normal.  CBC normal.  PAST MEDICAL HISTORY:  Her previous medical problems include: 1. Significant hypertension for years. 2. Tobacco abuse ongoing a pack or more per day. 3. History of alcohol abuse. 4. Bipolar disorder. 5. Substance abuse with opiates and benzodiazepines. 6. History of suicidal attempt. 7. GERD. 8. History of a kidney stone.  ALLERGIES:  She is intolerant of VICODIN and CODEINE.  She has been addicted to benzodiazepine in the past but is extremely anxious and requests Ativan in the ED.  There is an ASPIRIN allergy which she disclaims today.  FAMILY HISTORY:  Her mother has hypertension and also mental illness. There is no previous history of coronary artery disease in her family.  MEDICATIONS AT HOME: 1. Lexapro 20 mg p.o. daily. 2. Seroquel unknown dose at this time.  She is not taking anything for     blood pressure.  SOCIAL HISTORY:  She lives alone in [place].  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  Her boyfriend is with her in the room.  She has 2 children.  REVIEW OF SYSTEMS:  Other than  HPI is negative.  She does have a history of reflux but no abdominal pain, no melena, no change in bowel habits.  PHYSICAL EXAMINATION:  GENERAL:  She is an extremely anxious lady in no acute distress.  She is alert and oriented x3. SKIN:  Warm and dry. VITAL SIGNS:  Her blood pressure is 163/97, her pulse was 90 and regular sinus rhythm, respirations 15, sats 100%. HEENT:  Sclerae are injected, slightly protuberant sockets.  Facial symmetry is normal.  Dentition satisfactory. NECK:  Supple.  Carotids upstrokes were equal and bilateral without bruits.  No JVD.  Thyroid is not enlarged.  Trachea is midline. LUNGS:  Clear to auscultation and percussion. HEART:  A nondisplaced PMI.  Regular rate and rhythm.  No murmur, rub or gallop. ABDOMEN:  Soft, good bowel sounds.  No  epigastric tenderness.  No obvious organomegaly. EXTREMITIES:  No cyanosis, clubbing or edema.  Pulses are present. NEURO:  She is very anxious but she is oriented and cooperative.  LABORATORY DATA:  As above.  EKG as above.  Chest x-ray:  Heart size is normal.  No active disease.  ASSESSMENT: 1. Out of hospital non-ST elevation myocardial infarction. 2. Tobacco abuse. 3. History of alcohol abuse remotely in the past. 4. History of severe hypertension and on no medications prior to     admission. 5. Bipolar disorder. 6. History of substance abuse with opiates and benzodiazepines.  PLAN: 1. Admit to step-down. 2. IV nitro and heparin. 3. IV beta blocker given as above and now p.o. metoprolol. 4. Continue aspirin. 5. Tobacco cessation. 6. Fasting lipid panel and TSH. 7. Cath on Monday.  I discussed this at length with her and her significant other and all questions answered.     Juwan Vences C. Daleen Squibb, MD, [facility]     TCW/MEDQ  D:  09/26/2010  T:  09/26/2010  Job:  [id number]  Electronically Signed by Valera Castle MD [facility] on 09/30/2010 06:30:45 PM

## 2010-10-06 LAB — CULTURE, BLOOD (ROUTINE X 2)
Culture  Setup Time: 201208220231
Culture  Setup Time: 201208220231
Culture: NO GROWTH
Culture: NO GROWTH

## 2010-10-14 ENCOUNTER — Encounter: Payer: Self-pay | Admitting: Physician Assistant

## 2010-10-15 ENCOUNTER — Ambulatory Visit (INDEPENDENT_AMBULATORY_CARE_PROVIDER_SITE_OTHER): Payer: Self-pay | Admitting: Physician Assistant

## 2010-10-15 ENCOUNTER — Encounter: Payer: Self-pay | Admitting: Physician Assistant

## 2010-10-15 VITALS — BP 122/78 | HR 62 | Ht 61.0 in | Wt 161.0 lb

## 2010-10-15 DIAGNOSIS — K219 Gastro-esophageal reflux disease without esophagitis: Secondary | ICD-10-CM | POA: Insufficient documentation

## 2010-10-15 DIAGNOSIS — I10 Essential (primary) hypertension: Secondary | ICD-10-CM

## 2010-10-15 DIAGNOSIS — F3130 Bipolar disorder, current episode depressed, mild or moderate severity, unspecified: Secondary | ICD-10-CM | POA: Insufficient documentation

## 2010-10-15 DIAGNOSIS — F17200 Nicotine dependence, unspecified, uncomplicated: Secondary | ICD-10-CM

## 2010-10-15 DIAGNOSIS — I2510 Atherosclerotic heart disease of native coronary artery without angina pectoris: Secondary | ICD-10-CM

## 2010-10-15 DIAGNOSIS — F319 Bipolar disorder, unspecified: Secondary | ICD-10-CM

## 2010-10-15 DIAGNOSIS — E785 Hyperlipidemia, unspecified: Secondary | ICD-10-CM

## 2010-10-15 DIAGNOSIS — Z720 Tobacco use: Secondary | ICD-10-CM | POA: Insufficient documentation

## 2010-10-15 LAB — BASIC METABOLIC PANEL
BUN: 8 mg/dL (ref 6–23)
CO2: 25 mEq/L (ref 19–32)
Calcium: 8.6 mg/dL (ref 8.4–10.5)
Chloride: 108 mEq/L (ref 96–112)
Creatinine, Ser: 0.7 mg/dL (ref 0.4–1.2)
GFR: 111.14 mL/min (ref >60.00)
Glucose, Bld: 96 mg/dL (ref 70–99)
Potassium: 4.1 mEq/L (ref 3.5–5.1)
Sodium: 139 mEq/L (ref 135–145)

## 2010-10-15 MED ORDER — PRASUGREL HCL 10 MG PO TABS: 10.0000 mg | ORAL_TABLET | Freq: Every day | ORAL | Status: DC

## 2010-10-15 MED ORDER — METOPROLOL TARTRATE 25 MG PO TABS: 75.0000 mg | ORAL_TABLET | Freq: Two times a day (BID) | ORAL | Status: DC

## 2010-10-15 MED ORDER — NITROGLYCERIN 0.4 MG SL SUBL: 0.4000 mg | SUBLINGUAL_TABLET | SUBLINGUAL | Status: DC | PRN

## 2010-10-15 MED ORDER — SIMVASTATIN 40 MG PO TABS: 40.0000 mg | ORAL_TABLET | Freq: Every day | ORAL | Status: DC

## 2010-10-15 NOTE — Assessment & Plan Note (Addendum)
Controlled.  Continue current therapy.   K+ low in the hospital.  Will check a BMET today.

## 2010-10-15 NOTE — Assessment & Plan Note (Signed)
She has not yet started the simvastatin.  She will get this today and she will have FLP and LFTs in 2 mos.

## 2010-10-15 NOTE — Assessment & Plan Note (Signed)
She knows she needs to quit.  

## 2010-10-15 NOTE — Assessment & Plan Note (Signed)
I have asked her to schedule a sooner follow up with her psychiatrist as I think her depression is worse since her MI.  She has agreed to do this.

## 2010-10-15 NOTE — Patient Instructions (Signed)
Your physician recommends that you schedule a follow-up appointment in: 2 MONTHS WITH DR. WALL PER SCOTT WEAVER, PA-C  Your physician recommends that you return for lab work in: 2 MONTHS FOR FASTING LIVER/LIPID PANEL 272.4  Your physician recommends that you return for lab work in: TODAY  BMET, CBC 401.9   YOU HAVE BEEN GIVEN A FORM TODAY TO FILL OUT FOR ASSISTANCE IN GETTING YOUR EFFIENT, PLEASE FILL OUT THIS FORM AND RETURN IT TO SCOTT WEAVER, PA ALONG WITH THE INFORMATION REQUIRED AS PER THE ASSISTANCE PROGRAM.

## 2010-10-15 NOTE — Assessment & Plan Note (Addendum)
She is stable.  We had a long discussion regarding her chest symptoms.  These do not sound cardiac.  Her prior angina is resolved.  I have reassured her today.  If she can get into cardiac rehab, I have strongly encouraged her to do this.  She knows to continue ASA and Effient.  We will help her with samples and filling out her assistance form.  She knows to quit smoking.  We discussed increasing her activity.  She can follow up with Dr. Daleen Squibb in 2 mos or sooner PRN.  Hgb dropped in the hospital and a CBC will be checked.

## 2010-10-15 NOTE — Progress Notes (Signed)
Addended by: Tarri Fuller on: 10/15/2010 12:57 PM   Modules accepted: Orders

## 2010-10-15 NOTE — Progress Notes (Signed)
History of Present Illness: Primary Cardiologist:  Dr. Valera Castle PCP:  Dr. Suzie Portela at [facility] [name] is a 45 y.o. female who presents for post hospital follow up.  She has a h/o HTN, GERD, tobacco and etoh abuse, bipolar disorder and prior suicide attempt.  She was admitted to [facility] 8/18-8/22 with a NSTEMI.  She presented with 2 days of chest pain.  Cardiac cath 09/29/10: oLAD 25%, m-dLAD 25-30%, mLAD 40%, D1 30%, mCFX 99%, pOM1 25%, pRCA 80% and 70%, oPDA 25%, EF 60%.  Echo 09/29/10: Ef 55-60%, mod LVH.  She underwent PCI with placement of a Promus DES to the [facility] and a Promus DES to the pRCA.  She was also placed on antibx's for a UTI.    Hospital Labs:  Hgb 10.8, K 3.3, Creat 0.72, ALT 13, Troponin peak 20.32, TC 157, TG 85, HDL 44, LDL 96, TSH 1.017;  CXR non acute  She has a lot of questions today.  I have tried to answer all of them.  We discussed the importance of ASA and Effient.  She still needs assistance for her Effient and we will give her samples today.  She denies chest pain reminiscent of her previous angina.  She denies chest heaviness or DOE.  No orthopnea, PND or edema.  No syncope.  She has occ sharp shooting chest pains that are brief.  She has lost her motivation.  She is worried all the time.  She feels down and depressed.  No suicidal ideations.    Past Medical History  Diagnosis Date  . CAD (coronary artery disease)     a. NSTEMI 8/12 tx with DES to [facility] and DES to pRCA;  Cardiac cath 09/29/10: oLAD 25%, m-dLAD 25-30%, mLAD 40%, D1 30%, mCFX 99%, pOM1 25%, pRCA 80% and 70%, oPDA 25%, EF 60%;  echo 8/12: EF 55-60%, mod LVH  . Hypertension   . Bipolar disorder   . GERD (gastroesophageal reflux disease)   . Dyslipidemia   . Tobacco abuse   . History of alcohol abuse     Current Outpatient Prescriptions  Medication Sig Dispense Refill  . aspirin EC 81 MG tablet Take 81 mg by mouth daily.        . metoprolol tartrate (LOPRESSOR) 25 MG tablet Take 75 mg by mouth 2  (two) times daily.        . naproxen (NAPROSYN) 500 MG tablet Take 500 mg by mouth 2 (two) times daily as needed.        . nicotine (NICODERM CQ - DOSED IN MG/24 HOURS) 21 mg/24hr patch Place 1 patch onto the skin daily.        . nitroGLYCERIN (NITROSTAT) 0.4 MG SL tablet Place 0.4 mg under the tongue every 5 (five) minutes as needed.        . prasugrel (EFFIENT) 10 MG TABS Take 10 mg by mouth daily.        . QUEtiapine (SEROQUEL) 100 MG tablet Take 100 mg by mouth at bedtime.        . simvastatin (ZOCOR) 40 MG tablet Take 40 mg by mouth at bedtime.          Allergies: Allergies  Allergen Reactions  . Aspirin   . Codeine Nausea And Vomiting  . Vicodin (Hydrocodone-Acetaminophen) Nausea And Vomiting   Social Hx:  She is smoking one cig a day  Vital Signs: BP 122/78  Pulse 62  Ht 5\' 1"  (1.549 m)  Wt 161 lb (73.029 kg)  BMI 30.42 kg/m2  PHYSICAL EXAM: Well nourished, well developed, in no acute distress HEENT: normal Neck: no JVD Cardiac:  normal S1, S2; RRR; no murmur Lungs:  clear to auscultation bilaterally, no wheezing, rhonchi or rales Abd: soft, nontender, no hepatomegaly Ext: no edema; right radial site without hematoma or bruit Skin: warm and dry Neuro:  CNs 2-12 intact, no focal abnormalities noted Psych: normal affect  EKG:  NSR, HR 62, leftward axis, minimal LVH, NSSTTW changes  ASSESSMENT AND PLAN:

## 2010-10-28 NOTE — Discharge Summary (Signed)
NAMETimberly, [name]                ACCOUNT NO.:  [id number]  MEDICAL RECORD NO.:  [id number]  LOCATION:  6533                         FACILITY:  [facility]  PHYSICIAN:  Bevelyn Buckles. Bensimhon, MDDATE OF BIRTH:  [birth year]  DATE OF ADMISSION:  09/26/2010 DATE OF DISCHARGE:  09/30/2010                              DISCHARGE SUMMARY   PROCEDURES: 1. Cardiac catheterization. 2. Coronary arteriogram. 3. Left ventriculogram. 4. PTCA and 2.25 x 20-mm Promus drug-eluting stent to the mid     circumflex. 5. 3.0 x 20-mm Promus drug-eluting stent to the RCA. 6. 2-D echocardiogram. 7. Portable chest x-ray.  PRIMARY FINAL DISCHARGE DIAGNOSIS:  Non-ST segment elevation myocardial infarction.  SECONDARY DIAGNOSES: 1. Ongoing tobacco use. 2. Remote history of EtOH. 3. Bipolar disorder. 4. Hypertension. 5. 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 6. Allergy or intolerance to VICODIN and CODEINE (ASPIRIN was listed     as an allergy, but the patient denies). 7. Dyslipidemia with total cholesterol 157, triglycerides 85, HDL 44,     LDL 96 this admission.  TIME OF DISCHARGE:  41 minutes.  HOSPITAL COURSE:  [name] is a 45 year old female with no previous history of coronary artery disease.  She had chest pain 2 days prior to admission.  It was intermittent at first, but was constant for greater than 12 hours prior to her presentation.  By the time she came to the hospital, her cardiac enzymes were elevated indicating a non-ST segment elevation MI.  She was taken to the cath lab.  The cardiac catheterization showed LAD 25% and 30% lesions as well as a 40% lesion, D1 30%, circumflex 99% with TIMI 2 flow which was treated with PTCA and a drug-eluting stent reducing the stenosis to zero.  OM1 25%, posterolateral, no significant disease, dominant RCA proximal 80% and 70%, which was also treated with PTCA and a drug-eluting stent reducing the stenosis to zero.  Her EF was 60%.  She  tolerated the procedure well.  She had borderline obesity with a body mass index of 30.  A Nutrition consult was called and a heart-healthy diet was reviewed with the patient.  She was started on a beta-blocker as well as Effient and statin.  She tolerated aspirin at 81 mg without difficulty.  She had some anemia with a hemoglobin of 10.8 post cath and hematocrit of 33.0. Her white count was elevated as high as 16,000 and urinalysis showed many bacteria.  A urine culture and blood cultures are pending at the time of dictation, but we will treat for UTI empirically with Cipro.  A chest x-ray just showed low lung volumes, but no acute process.  [name] was seen by Cardiac Rehab as well as smoking cessation.  A 2-D echocardiogram showed an EF of 55%-60% with hypokinesis at the base of the inferolateral wall.  There were no significant valvular abnormalities.  A case management consult was called for medication assistance.  She was given a card for 30 days of Effient and an appointment was made at [facility] for her as well.  On September 30, 2010, she was evaluated by Dr. Daleen Squibb.  She was ambulating without chest pain or shortness of  breath and considered stable for discharge, to follow up as an outpatient.  DISCHARGE INSTRUCTIONS:  Her activity level is to be increased gradually.  She is encouraged to stick to a low-sodium heart-healthy diet.  She is to call our office for problems with the cath site.  She is to follow up with Dr. Suzie Portela at [facility] and after an eligibility appointment on October 22, 2010 at 2 o'clock.  She is to follow up with Tereso Newcomer, [facility] for Dr. Daleen Squibb on September 6 at 9 a.m.  DISCHARGE MEDICATIONS: 1. Lexapro 20 mg daily. 2. Seroquel 100 mg at bedtime. 3. Nicotine patch daily as directed by smoking cessation. 4. Lopressor 25 mg 3 tablets b.i.d. 5. Methocarbamol is discontinued. 6. Zocor 40 mg daily. 7. Sublingual nitroglycerin p.r.n. 8. Naprosyn 500 mg  b.i.d. p.r.n. 9. Aspirin 81 mg a day. 10.Effient 10 mg a day. 11.Cipro 500 mg b.i.d. for 5 days.     Theodore Demark, PA-C   ______________________________ Bevelyn Buckles. Bensimhon, MD    RB/MEDQ  D:  09/30/2010  T:  09/30/2010  Job:  [id number]  cc:   Dr. Suzie Portela  Electronically Signed by Theodore Demark PA-C on 10/07/2010 06:37:11 AM Electronically Signed by Arvilla Meres MD on 10/28/2010 10:35:21 PM

## 2010-12-02 ENCOUNTER — Emergency Department (HOSPITAL_COMMUNITY)
Admission: EM | Admit: 2010-12-02 | Discharge: 2010-12-02 | Disposition: A | Discharge status: Home / Self Care | Payer: Medicaid Other | Attending: Emergency Medicine | Admitting: Emergency Medicine

## 2010-12-02 DIAGNOSIS — K219 Gastro-esophageal reflux disease without esophagitis: Secondary | ICD-10-CM | POA: Insufficient documentation

## 2010-12-02 DIAGNOSIS — I10 Essential (primary) hypertension: Secondary | ICD-10-CM | POA: Insufficient documentation

## 2010-12-02 DIAGNOSIS — Z79899 Other long term (current) drug therapy: Secondary | ICD-10-CM | POA: Insufficient documentation

## 2010-12-02 DIAGNOSIS — E7800 Pure hypercholesterolemia, unspecified: Secondary | ICD-10-CM | POA: Insufficient documentation

## 2010-12-02 DIAGNOSIS — Z7982 Long term (current) use of aspirin: Secondary | ICD-10-CM | POA: Insufficient documentation

## 2010-12-02 DIAGNOSIS — R300 Dysuria: Secondary | ICD-10-CM | POA: Insufficient documentation

## 2010-12-02 DIAGNOSIS — R112 Nausea with vomiting, unspecified: Secondary | ICD-10-CM | POA: Insufficient documentation

## 2010-12-02 DIAGNOSIS — R109 Unspecified abdominal pain: Secondary | ICD-10-CM | POA: Insufficient documentation

## 2010-12-02 DIAGNOSIS — N12 Tubulo-interstitial nephritis, not specified as acute or chronic: Secondary | ICD-10-CM | POA: Insufficient documentation

## 2010-12-02 DIAGNOSIS — F988 Other specified behavioral and emotional disorders with onset usually occurring in childhood and adolescence: Secondary | ICD-10-CM | POA: Insufficient documentation

## 2010-12-02 DIAGNOSIS — I252 Old myocardial infarction: Secondary | ICD-10-CM | POA: Insufficient documentation

## 2010-12-02 DIAGNOSIS — F319 Bipolar disorder, unspecified: Secondary | ICD-10-CM | POA: Insufficient documentation

## 2010-12-02 DIAGNOSIS — R350 Frequency of micturition: Secondary | ICD-10-CM | POA: Insufficient documentation

## 2010-12-02 DIAGNOSIS — R3915 Urgency of urination: Secondary | ICD-10-CM | POA: Insufficient documentation

## 2010-12-02 LAB — CBC
HCT: 37.9 % (ref 36.0–46.0)
Hemoglobin: 12.6 g/dL (ref 12.0–15.0)
MCH: 27.8 pg (ref 26.0–34.0)
MCHC: 33.2 g/dL (ref 30.0–36.0)
MCV: 83.7 fL (ref 78.0–100.0)
Platelets: 335 10*3/uL (ref 150–400)
RBC: 4.53 MIL/uL (ref 3.87–5.11)
RDW: 14.8 % (ref 11.5–15.5)
WBC: 9.5 10*3/uL (ref 4.0–10.5)

## 2010-12-02 LAB — POCT I-STAT TROPONIN I: Troponin i, poc: 0 ng/mL (ref 0.00–0.08)

## 2010-12-02 LAB — URINALYSIS, ROUTINE W REFLEX MICROSCOPIC
Bilirubin Urine: NEGATIVE
Glucose, UA: NEGATIVE mg/dL
Ketones, ur: NEGATIVE mg/dL
Nitrite: NEGATIVE
Protein, ur: 30 mg/dL — AB
Specific Gravity, Urine: 1.017 (ref 1.005–1.030)
Urobilinogen, UA: 0.2 mg/dL (ref 0.0–1.0)
pH: 6 (ref 5.0–8.0)

## 2010-12-02 LAB — POCT I-STAT, CHEM 8
BUN: 7 mg/dL (ref 6–23)
Calcium, Ion: 1.22 mmol/L (ref 1.12–1.32)
Chloride: 106 mEq/L (ref 96–112)
Creatinine, Ser: 0.7 mg/dL (ref 0.50–1.10)
Glucose, Bld: 122 mg/dL — ABNORMAL HIGH (ref 70–99)
HCT: 41 % (ref 36.0–46.0)
Hemoglobin: 13.9 g/dL (ref 12.0–15.0)
Potassium: 3.7 mEq/L (ref 3.5–5.1)
Sodium: 138 mEq/L (ref 135–145)
TCO2: 21 mmol/L (ref 0–100)

## 2010-12-02 LAB — POCT PREGNANCY, URINE: Preg Test, Ur: NEGATIVE

## 2010-12-02 LAB — URINE MICROSCOPIC-ADD ON

## 2010-12-02 LAB — DIFFERENTIAL
Basophils Absolute: 0 10*3/uL (ref 0.0–0.1)
Basophils Relative: 0 % (ref 0–1)
Eosinophils Absolute: 0.2 10*3/uL (ref 0.0–0.7)
Eosinophils Relative: 2 % (ref 0–5)
Lymphocytes Relative: 15 % (ref 12–46)
Lymphs Abs: 1.4 10*3/uL (ref 0.7–4.0)
Monocytes Absolute: 1 10*3/uL (ref 0.1–1.0)
Monocytes Relative: 10 % (ref 3–12)
Neutro Abs: 7 10*3/uL (ref 1.7–7.7)
Neutrophils Relative %: 73 % (ref 43–77)

## 2010-12-10 ENCOUNTER — Other Ambulatory Visit: Payer: Self-pay | Admitting: *Deleted

## 2010-12-21 ENCOUNTER — Telehealth: Payer: Self-pay | Admitting: *Deleted

## 2010-12-21 NOTE — Telephone Encounter (Signed)
Spoke with pt, she is aware effient from the company at the front desk for pick up [employer]

## 2010-12-25 ENCOUNTER — Encounter: Payer: Self-pay | Admitting: Cardiology

## 2010-12-25 ENCOUNTER — Ambulatory Visit (INDEPENDENT_AMBULATORY_CARE_PROVIDER_SITE_OTHER): Payer: Self-pay | Admitting: Cardiology

## 2010-12-25 VITALS — BP 150/82 | HR 72 | Ht 63.0 in | Wt 163.0 lb

## 2010-12-25 DIAGNOSIS — Z720 Tobacco use: Secondary | ICD-10-CM

## 2010-12-25 DIAGNOSIS — I10 Essential (primary) hypertension: Secondary | ICD-10-CM

## 2010-12-25 DIAGNOSIS — F17200 Nicotine dependence, unspecified, uncomplicated: Secondary | ICD-10-CM

## 2010-12-25 DIAGNOSIS — E785 Hyperlipidemia, unspecified: Secondary | ICD-10-CM

## 2010-12-25 DIAGNOSIS — I2510 Atherosclerotic heart disease of native coronary artery without angina pectoris: Secondary | ICD-10-CM

## 2010-12-25 MED ORDER — METOPROLOL TARTRATE 25 MG PO TABS: 75.0000 mg | ORAL_TABLET | Freq: Two times a day (BID) | ORAL | Status: DC

## 2010-12-25 MED ORDER — VARENICLINE TARTRATE 0.5 MG PO TABS: 0.5000 mg | ORAL_TABLET | Freq: Two times a day (BID) | ORAL | Status: DC

## 2010-12-25 MED ORDER — VARENICLINE TARTRATE 0.5 MG X 11 & 1 MG X 42 PO MISC: ORAL | Status: DC

## 2010-12-25 MED ORDER — SIMVASTATIN 40 MG PO TABS: 40.0000 mg | ORAL_TABLET | Freq: Every day | ORAL | Status: DC

## 2010-12-25 NOTE — Assessment & Plan Note (Signed)
Check fasting lipids and a comprehensive metabolic profile

## 2010-12-25 NOTE — Progress Notes (Signed)
HPI [name] comes in today for close followup after her recent non-STEMI and PCI of her circumflex and right coronary artery. She has diffuse nonobstructive disease otherwise.  Her risk factors were tobacco use which she continues, hypertension which she was not taking her meds for, obesity, hyperlipidemia.  She is having no symptoms of angina or ischemia at present. She continues to smoke. She never got her patches filled because she says she can't afford them. She does have Medicaid because of being unemployed. She does take her other medications. She has not had post hospital blood work on her statin. She came in with a bag from [facility] but said this is the first pass food she's had since August! She mostly each grilled chicken sandwiches from [place].   He would like me to give her something to help her stop smoking. I know that she's had addiction to opiates and benzodiazepines in the past.  Denies orthopnea, PND or edema.  He has normal left ventricular function by echo and moderate LVH.  Past Medical History  Diagnosis Date  . CAD (coronary artery disease)     a. NSTEMI 8/12 tx with DES to [facility] and DES to pRCA;  Cardiac cath 09/29/10: oLAD 25%, m-dLAD 25-30%, mLAD 40%, D1 30%, mCFX 99%, pOM1 25%, pRCA 80% and 70%, oPDA 25%, EF 60%;  echo 8/12: EF 55-60%, mod LVH  . Hypertension   . Bipolar disorder   . GERD (gastroesophageal reflux disease)   . Dyslipidemia   . Tobacco abuse   . History of alcohol abuse     Current Outpatient Prescriptions  Medication Sig Dispense Refill  . aspirin EC 81 MG tablet Take 81 mg by mouth daily.        . metoprolol tartrate (LOPRESSOR) 25 MG tablet Take 3 tablets (75 mg total) by mouth 2 (two) times daily.  180 tablet  3  . naproxen (NAPROSYN) 500 MG tablet Take 500 mg by mouth 2 (two) times daily as needed.        . nitroGLYCERIN (NITROSTAT) 0.4 MG SL tablet Place 1 tablet (0.4 mg total) under the tongue every 5 (five) minutes as  needed.  25 tablet  2  . prasugrel (EFFIENT) 10 MG TABS Take 1 tablet (10 mg total) by mouth daily.  30 tablet  11  . QUEtiapine (SEROQUEL) 200 MG tablet Take 200 mg by mouth at bedtime.        . simvastatin (ZOCOR) 40 MG tablet Take 1 tablet (40 mg total) by mouth at bedtime.  30 tablet  11  . DISCONTD: metoprolol tartrate (LOPRESSOR) 25 MG tablet Take 3 tablets (75 mg total) by mouth 2 (two) times daily.  180 tablet  3  . DISCONTD: simvastatin (ZOCOR) 40 MG tablet Take 1 tablet (40 mg total) by mouth at bedtime.  30 tablet  11  . varenicline (CHANTIX STARTING MONTH PAK) 0.5 MG X 11 & 1 MG X 42 tablet Take one 0.5mg  tablet by mouth once daily for 3 days, then increase to one 0.5mg  tablet twice daily for 3 days, then increase to one 1mg  tablet twice daily.  53 tablet  0  . varenicline (CHANTIX) 0.5 MG tablet Take 1 tablet (0.5 mg total) by mouth 2 (two) times daily.  128 tablet  2    Allergies  Allergen Reactions  . Aspirin   . Codeine Nausea And Vomiting  . Vicodin (Hydrocodone-Acetaminophen) Nausea And Vomiting    Family History  Problem Relation  Age of Onset  . Hypertension Mother   . Mental illness Mother     History   Social History  . Marital Status: Single    Spouse Name: N/A    Number of Children: 2  . Years of Education: N/A   Occupational History  . NURSE AIDE    Social History Main Topics  . Smoking status: Current Everyday Smoker    Types: Cigarettes  . Smokeless tobacco: Not on file  . Alcohol Use: No     REMOTE USE  . Drug Use: Yes    Special: Benzodiazepines, Opium  . Sexually Active: Not on file   Other Topics Concern  . Not on file   Social History Narrative  . No narrative on file    ROS ALL NEGATIVE EXCEPT THOSE NOTED IN HPI  PE  General Appearance: well developed, well nourished in no acute distress, obese HEENT: symmetrical face, PERRLA, good dentition  Neck: no JVD, thyromegaly, or adenopathy, trachea midline Chest: symmetric without  deformity Cardiac: PMI non-displaced, RRR, normal S1, S2, no gallop or murmur Lung: clear to ausculation and percussion Vascular: all pulses full without bruits  Abdominal: nondistended, nontender, good bowel sounds, no HSM, no bruits Extremities: no cyanosis, clubbing or edema, no sign of DVT, no varicosities  Skin: normal color, no rashes Neuro: alert and oriented x 3, non-focal Pysch: normal affect  EKG Not repeated today. BMET    Component Value Date/Time   NA 138 12/02/2010 0658   K 3.7 12/02/2010 0658   CL 106 12/02/2010 0658   CO2 25 10/15/2010 1008   GLUCOSE 122* 12/02/2010 0658   BUN 7 12/02/2010 0658   CREATININE 0.70 12/02/2010 0658   CALCIUM 8.6 10/15/2010 1008   GFRNONAA >60 09/29/2010 0640   GFRAA >60 09/29/2010 0640    Lipid Panel     Component Value Date/Time   CHOL 157 09/27/2010 0555   TRIG 85 09/27/2010 0555   HDL 44 09/27/2010 0555   CHOLHDL 3.6 09/27/2010 0555   VLDL 17 09/27/2010 0555   LDLCALC 96 09/27/2010 0555    CBC    Component Value Date/Time   WBC 9.5 12/02/2010 0640   RBC 4.53 12/02/2010 0640   HGB 13.9 12/02/2010 0658   HCT 41.0 12/02/2010 0658   PLT 335 12/02/2010 0640   MCV 83.7 12/02/2010 0640   MCH 27.8 12/02/2010 0640   MCHC 33.2 12/02/2010 0640   RDW 14.8 12/02/2010 0640   LYMPHSABS 1.4 12/02/2010 0640   MONOABS 1.0 12/02/2010 0640   EOSABS 0.2 12/02/2010 0640   BASOSABS 0.0 12/02/2010 0640

## 2010-12-25 NOTE — Patient Instructions (Signed)
Your physician discussed the hazards of tobacco use. Tobacco use cessation is recommended and techniques and options to help you quit were discussed. Chantix has been advised and prescribed for this.    Your physician encouraged you to lose weight for better health. Diet and exercise encouraged  Your physician recommends that you return for lab work in: 1 week, FASTING Lipid panel, BMP and Liver function  Your physician recommends that you schedule a follow-up appointment in: 3 months with Dr. Daleen Squibb

## 2010-12-25 NOTE — Assessment & Plan Note (Signed)
She is currently asymptomatic at high risk for stent thrombosis and acute coronary syndrome. I have made it very clear to her that she keeps smoking any way she is eating she will probably have another event. Her risk of stroke is also significant. I have made no changes in her medical program. I have prescribed Chantix which Medicaid will pay for. We researched this today. She never has taken the patches because she couldn't afford him though she continues to smoke and eat fast food. I will see her back in the office in 3 months. In the meantime we will arrange for fasting lipids and a comprehensive metabolic profile

## 2010-12-25 NOTE — Assessment & Plan Note (Signed)
Strongly encouraged to stop smoking. Chantix given.

## 2010-12-25 NOTE — Assessment & Plan Note (Signed)
Association of tobacco use, noncompliance with her diet, obesity related to her blood pressure issues. I made clear this increases her risk of all kinds of vascular events including stroke. Strongly encouraged her to take her meds and stop smoking.

## 2011-01-01 ENCOUNTER — Other Ambulatory Visit: Payer: Self-pay | Admitting: *Deleted

## 2011-01-05 ENCOUNTER — Telehealth: Payer: Self-pay | Admitting: Cardiology

## 2011-01-05 ENCOUNTER — Encounter: Payer: Self-pay | Admitting: *Deleted

## 2011-01-05 ENCOUNTER — Telehealth: Payer: Self-pay | Admitting: *Deleted

## 2011-01-05 NOTE — Telephone Encounter (Signed)
LMOVM letter is at front desk Mylo Red RN

## 2011-01-05 NOTE — Telephone Encounter (Signed)
Walk in pt Form " Pt Needs letterhead for School and form Filled Out" sent to Mylo Red  01/05/11/km

## 2011-01-24 ENCOUNTER — Emergency Department (HOSPITAL_COMMUNITY)
Admission: EM | Admit: 2011-01-24 | Discharge: 2011-01-24 | Disposition: A | Discharge status: Home / Self Care | Payer: Medicaid Other | Attending: Emergency Medicine | Admitting: Emergency Medicine

## 2011-01-24 ENCOUNTER — Encounter (HOSPITAL_COMMUNITY): Payer: Self-pay | Admitting: *Deleted

## 2011-01-24 ENCOUNTER — Emergency Department (HOSPITAL_COMMUNITY): Payer: Medicaid Other

## 2011-01-24 ENCOUNTER — Other Ambulatory Visit: Payer: Self-pay

## 2011-01-24 DIAGNOSIS — R9431 Abnormal electrocardiogram [ECG] [EKG]: Secondary | ICD-10-CM | POA: Insufficient documentation

## 2011-01-24 DIAGNOSIS — R42 Dizziness and giddiness: Secondary | ICD-10-CM | POA: Insufficient documentation

## 2011-01-24 DIAGNOSIS — E785 Hyperlipidemia, unspecified: Secondary | ICD-10-CM | POA: Insufficient documentation

## 2011-01-24 DIAGNOSIS — F319 Bipolar disorder, unspecified: Secondary | ICD-10-CM | POA: Insufficient documentation

## 2011-01-24 DIAGNOSIS — H9319 Tinnitus, unspecified ear: Secondary | ICD-10-CM | POA: Insufficient documentation

## 2011-01-24 DIAGNOSIS — I2510 Atherosclerotic heart disease of native coronary artery without angina pectoris: Secondary | ICD-10-CM | POA: Insufficient documentation

## 2011-01-24 DIAGNOSIS — F17200 Nicotine dependence, unspecified, uncomplicated: Secondary | ICD-10-CM | POA: Insufficient documentation

## 2011-01-24 DIAGNOSIS — I10 Essential (primary) hypertension: Secondary | ICD-10-CM | POA: Insufficient documentation

## 2011-01-24 DIAGNOSIS — K219 Gastro-esophageal reflux disease without esophagitis: Secondary | ICD-10-CM | POA: Insufficient documentation

## 2011-01-24 DIAGNOSIS — R55 Syncope and collapse: Secondary | ICD-10-CM | POA: Insufficient documentation

## 2011-01-24 DIAGNOSIS — I252 Old myocardial infarction: Secondary | ICD-10-CM | POA: Insufficient documentation

## 2011-01-24 LAB — BASIC METABOLIC PANEL
BUN: 7 mg/dL (ref 6–23)
CO2: 23 mEq/L (ref 19–32)
Calcium: 9.3 mg/dL (ref 8.4–10.5)
Chloride: 107 mEq/L (ref 96–112)
Creatinine, Ser: 0.87 mg/dL (ref 0.50–1.10)
GFR calc Af Amer: >90 mL/min (ref >90)
GFR calc non Af Amer: 80 mL/min — ABNORMAL LOW (ref >90)
Glucose, Bld: 84 mg/dL (ref 70–99)
Potassium: 4.2 mEq/L (ref 3.5–5.1)
Sodium: 138 mEq/L (ref 135–145)

## 2011-01-24 LAB — POCT I-STAT TROPONIN I: Troponin i, poc: 0.01 ng/mL (ref 0.00–0.08)

## 2011-01-24 LAB — CBC
HCT: 36.2 % (ref 36.0–46.0)
Hemoglobin: 11.6 g/dL — ABNORMAL LOW (ref 12.0–15.0)
MCH: 26.5 pg (ref 26.0–34.0)
MCHC: 32 g/dL (ref 30.0–36.0)
MCV: 82.8 fL (ref 78.0–100.0)
Platelets: 291 10*3/uL (ref 150–400)
RBC: 4.37 MIL/uL (ref 3.87–5.11)
RDW: 15.6 % — ABNORMAL HIGH (ref 11.5–15.5)
WBC: 6.6 10*3/uL (ref 4.0–10.5)

## 2011-01-24 MED ORDER — MECLIZINE HCL 25 MG PO TABS: 25.0000 mg | ORAL_TABLET | Freq: Four times a day (QID) | ORAL | Status: DC | PRN

## 2011-01-24 MED ORDER — LORAZEPAM 2 MG/ML IJ SOLN
2.0000 mg | Freq: Once | INTRAMUSCULAR | Status: AC
  Administered 2011-01-24: 2 mg via INTRAVENOUS
  Filled 2011-01-24: qty 1

## 2011-01-24 MED ORDER — MECLIZINE HCL 25 MG PO TABS
25.0000 mg | ORAL_TABLET | Freq: Once | ORAL | Status: AC
  Administered 2011-01-24: 25 mg via ORAL
  Filled 2011-01-24: qty 1
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
## 2011-01-24 NOTE — ED Notes (Signed)
Pt going to MRI of brain and will return to cdu#7 when finished with scan. Report received from Nadyne Coombes, RN.

## 2011-01-24 NOTE — ED Notes (Signed)
Patient transported to MRI with Charge Nurse.

## 2011-01-24 NOTE — ED Notes (Signed)
Pt continues to await Child psychotherapist.
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
## 2011-01-24 NOTE — ED Notes (Signed)
Environmental consultant at bedside

## 2011-01-24 NOTE — ED Notes (Signed)
Per MRI Tech pt uncooperative and having anxiety about test. Pt sts that no one explained the test to her and that is claustrophobic. Pt being returned to dept since staff not able to do MRI on pt secondary to noncompliance.
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
## 2011-01-24 NOTE — ED Provider Notes (Signed)
11:30 AM  Date: 01/24/2011  Rate: 69  Rhythm: normal sinus rhythm  QRS Axis: normal  Intervals: QT prolonged  ST/T Wave abnormalities: Inverted T waves in inferior leads.  Conduction Disutrbances:none  Narrative Interpretation: Abnormal EKG.  Old EKG Reviewed: changes noted--inverted T waves in inferior leads is new.    Carleene Cooper III, MD 01/24/11 [phone]

## 2011-01-24 NOTE — ED Notes (Signed)
MRI called and advised pt is not being cooperative.  Advised her to contact Dr Ignacia Palma.

## 2011-01-24 NOTE — ED Notes (Signed)
Patient reports she noted onset of dizziness last night and again today.  Patient reports she is exhausted,  She has been working doubles and has had increased stress as well.  Patient denies pain.  Patient does have hx of mi in August.  She complains of numbness in her fingers and toes intermittently
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
## 2011-01-27 ENCOUNTER — Encounter (HOSPITAL_COMMUNITY): Payer: Self-pay | Admitting: Emergency Medicine

## 2011-01-27 ENCOUNTER — Emergency Department (HOSPITAL_COMMUNITY)
Admission: EM | Admit: 2011-01-27 | Discharge: 2011-01-28 | Disposition: A | Discharge status: Home / Self Care | Payer: Medicaid Other | Attending: Emergency Medicine | Admitting: Emergency Medicine

## 2011-01-27 ENCOUNTER — Telehealth: Payer: Self-pay | Admitting: Cardiology

## 2011-01-27 ENCOUNTER — Other Ambulatory Visit: Payer: Self-pay

## 2011-01-27 DIAGNOSIS — E785 Hyperlipidemia, unspecified: Secondary | ICD-10-CM | POA: Insufficient documentation

## 2011-01-27 DIAGNOSIS — I252 Old myocardial infarction: Secondary | ICD-10-CM | POA: Insufficient documentation

## 2011-01-27 DIAGNOSIS — I2510 Atherosclerotic heart disease of native coronary artery without angina pectoris: Secondary | ICD-10-CM | POA: Insufficient documentation

## 2011-01-27 DIAGNOSIS — K219 Gastro-esophageal reflux disease without esophagitis: Secondary | ICD-10-CM | POA: Insufficient documentation

## 2011-01-27 DIAGNOSIS — F17200 Nicotine dependence, unspecified, uncomplicated: Secondary | ICD-10-CM | POA: Insufficient documentation

## 2011-01-27 DIAGNOSIS — R002 Palpitations: Secondary | ICD-10-CM | POA: Insufficient documentation

## 2011-01-27 DIAGNOSIS — F411 Generalized anxiety disorder: Secondary | ICD-10-CM | POA: Insufficient documentation

## 2011-01-27 DIAGNOSIS — F319 Bipolar disorder, unspecified: Secondary | ICD-10-CM | POA: Insufficient documentation

## 2011-01-27 DIAGNOSIS — I10 Essential (primary) hypertension: Secondary | ICD-10-CM | POA: Insufficient documentation

## 2011-01-27 DIAGNOSIS — R0602 Shortness of breath: Secondary | ICD-10-CM | POA: Insufficient documentation

## 2011-01-27 DIAGNOSIS — F410 Panic disorder [episodic paroxysmal anxiety] without agoraphobia: Secondary | ICD-10-CM

## 2011-01-27 NOTE — ED Notes (Signed)
Pt st's when she gets quiet she starts having a panic or anxiety attack.  St's she feels anxious

## 2011-01-27 NOTE — ED Notes (Signed)
Pt in waiting room calmly resting under blanket when RN brought her back.

## 2011-01-27 NOTE — Telephone Encounter (Signed)
New message:  Pt seen in ED on Sunday for Dizziness.  They gave her antivert but she has not gotten if filled yet now feeling dizzy again.  Please call her back as soon as possible.

## 2011-01-27 NOTE — Telephone Encounter (Signed)
Reviewed discharge ED instructions with pt. She is drinking too many sodas according to her except for today. She will have the prescription for antivert filled and take as directed.  She will come in on Monday for overdue fasting lipid and liver. She missed lab appt in Nov. It was not drawn in the ED. Mylo Red RN

## 2011-01-28 ENCOUNTER — Encounter (HOSPITAL_COMMUNITY): Payer: Self-pay | Admitting: *Deleted

## 2011-01-28 LAB — POCT I-STAT, CHEM 8
BUN: 6 mg/dL (ref 6–23)
Calcium, Ion: 1.24 mmol/L (ref 1.12–1.32)
Chloride: 108 mEq/L (ref 96–112)
Creatinine, Ser: 0.7 mg/dL (ref 0.50–1.10)
Glucose, Bld: 90 mg/dL (ref 70–99)
HCT: 40 % (ref 36.0–46.0)
Hemoglobin: 13.6 g/dL (ref 12.0–15.0)
Potassium: 3.8 mEq/L (ref 3.5–5.1)
Sodium: 141 mEq/L (ref 135–145)
TCO2: 23 mmol/L (ref 0–100)

## 2011-01-28 LAB — POCT I-STAT TROPONIN I: Troponin i, poc: 0 ng/mL (ref 0.00–0.08)

## 2011-01-28 MED ORDER — LORAZEPAM 1 MG PO TABS
1.0000 mg | ORAL_TABLET | Freq: Once | ORAL | Status: AC
  Administered 2011-01-28: 1 mg via ORAL
  Filled 2011-01-28: qty 1

## 2011-01-28 MED ORDER — LORAZEPAM 1 MG PO TABS: 1.0000 mg | ORAL_TABLET | Freq: Three times a day (TID) | ORAL | Status: AC | PRN

## 2011-01-28 NOTE — ED Provider Notes (Signed)
Medical screening examination/treatment/procedure(s) were performed by non-physician practitioner and as supervising physician I was immediately available for consultation/collaboration.    Ayiana Winslett R Yoskar Murrillo, MD 01/28/11 0728 

## 2011-01-28 NOTE — ED Notes (Signed)
Pt sleeping in bed when RN came to give medication.

## 2011-01-28 NOTE — ED Provider Notes (Signed)
History     CSN: [id number] Arrival date & time: 01/27/2011 11:04 PM   First MD Initiated Contact with Patient 01/28/11 0102      Chief Complaint  Patient presents with  . Anxiety    Patient is a 45 y.o. female presenting with anxiety. The history is provided by the patient.  Anxiety This is a new problem. The current episode started in the past 7 days. The problem occurs daily. The problem has been gradually worsening. Pertinent negatives include no chest pain. The symptoms are aggravated by stress. She has tried nothing for the symptoms. The treatment provided no relief.  Reports onset of episodes during which she becomes overwhelmingly fearful, feels her heart racing and has the feeling as if she can't catch her breath. Episodes began Sunday and have occurred at least daily since, the worst episode occurring last this past evening just prior to arrival. She was attempting to sleep, awoke w/ same sense of overwhelming fear, palpitation and SOB. Admits she is being treated for depression and is under a lot of stress as she just recently took custody of her 63 y/o grandchild, but has never had these episodes before.   Past Medical History  Diagnosis Date  . CAD (coronary artery disease)     a. NSTEMI 8/12 tx with DES to [facility] and DES to pRCA;  Cardiac cath 09/29/10: oLAD 25%, m-dLAD 25-30%, mLAD 40%, D1 30%, mCFX 99%, pOM1 25%, pRCA 80% and 70%, oPDA 25%, EF 60%;  echo 8/12: EF 55-60%, mod LVH  . Hypertension   . Bipolar disorder   . GERD (gastroesophageal reflux disease)   . Dyslipidemia   . Tobacco abuse   . History of alcohol abuse   . Myocardial infarction     Past Surgical History  Procedure Date  . Cardiac catheterization     Family History  Problem Relation Age of Onset  . Hypertension Mother   . Mental illness Mother     History  Substance Use Topics  . Smoking status: Current Everyday Smoker    Types: Cigarettes  . Smokeless tobacco: Not on file  . Alcohol Use: No      REMOTE USE    OB History    Grav Para Term Preterm Abortions TAB SAB Ect Mult Living                  Review of Systems  Constitutional: Negative.   HENT: Negative.   Eyes: Negative.   Respiratory: Negative.   Cardiovascular: Negative.  Negative for chest pain.  Gastrointestinal: Negative.   Genitourinary: Negative.   Musculoskeletal: Negative.   Skin: Negative.   Neurological: Negative.   Hematological: Negative.   Psychiatric/Behavioral: Negative.     Allergies  Aspirin; Codeine; and Vicodin  Home Medications   Current Outpatient Rx  Name Route Sig Dispense Refill  . ASPIRIN EC 81 MG PO TBEC Oral Take 81 mg by mouth daily.      Marland Kitchen MECLIZINE HCL 25 MG PO TABS Oral Take 25 mg by mouth 4 (four) times daily as needed. For dizziness.     Marland Kitchen METOPROLOL TARTRATE 25 MG PO TABS Oral Take 75 mg by mouth 2 (two) times daily.      Marland Kitchen NITROGLYCERIN 0.4 MG SL SUBL Sublingual Place 0.4 mg under the tongue every 5 (five) minutes as needed. For chest pain.     Marland Kitchen PRASUGREL HCL 10 MG PO TABS Oral Take 10 mg by mouth daily.      Marland Kitchen  QUETIAPINE FUMARATE 200 MG PO TABS Oral Take 200 mg by mouth at bedtime.      Marland Kitchen SIMVASTATIN 40 MG PO TABS Oral Take 40 mg by mouth at bedtime.        BP 164/89  Pulse 88  Temp 98.4 F (36.9 C)  Resp 12  SpO2 99%  LMP 01/27/2011  Physical Exam  Constitutional: She is oriented to person, place, and time. She appears well-developed and well-nourished.  HENT:  Head: Normocephalic and atraumatic.  Eyes: Conjunctivae are normal.  Neck: Neck supple.  Cardiovascular: Normal rate and regular rhythm.   Pulmonary/Chest: Effort normal and breath sounds normal.  Abdominal: Soft. Bowel sounds are normal.  Musculoskeletal: Normal range of motion.  Neurological: She is alert and oriented to person, place, and time.  Skin: Skin is warm and dry. No erythema.  Psychiatric: Her speech is normal and behavior is normal. Judgment and thought content normal. Her mood  appears anxious. Cognition and memory are normal.    ED Course  Procedures     Date: 01/28/2011  Rate: 59  Rhythm: sinus bradycardia  QRS Axis: normal  Intervals: normal  ST/T Wave abnormalities: normal  Conduction Disutrbances:first-degree A-V block   Narrative Interpretation:   Old EKG Reviewed: changes noted  Findings and impression discussed w/ pt. She reports symptoms much improved w/ Po Ativan. Will prescribe short course of Ativan to PRN and encourage close f/u w/ her mental health provider at [facility]. Pt has scheduled appointment there after Christmas. Pt agreeable w/ plan.     Labs Reviewed  POCT I-STAT, CHEM 8  POCT I-STAT TROPONIN I  I-STAT TROPONIN I  I-STAT, CHEM 8   No results found.   No diagnosis found.    MDM  HPI/PE and findings c/w new onset anxiety/panic attacks.        Leanne Chang, NP 01/28/11 [phone]

## 2011-02-01 ENCOUNTER — Other Ambulatory Visit: Payer: Self-pay | Admitting: *Deleted

## 2011-03-17 ENCOUNTER — Encounter (HOSPITAL_COMMUNITY): Payer: Self-pay | Admitting: *Deleted

## 2011-03-17 ENCOUNTER — Emergency Department (HOSPITAL_COMMUNITY)
Admission: EM | Admit: 2011-03-17 | Discharge: 2011-03-18 | Disposition: A | Discharge status: Other Acute Inpatient Hospital | Payer: Medicaid Other | Source: Home / Self Care | Attending: Emergency Medicine | Admitting: Emergency Medicine

## 2011-03-17 DIAGNOSIS — G4700 Insomnia, unspecified: Secondary | ICD-10-CM | POA: Insufficient documentation

## 2011-03-17 DIAGNOSIS — R45851 Suicidal ideations: Secondary | ICD-10-CM

## 2011-03-17 DIAGNOSIS — F3289 Other specified depressive episodes: Secondary | ICD-10-CM | POA: Insufficient documentation

## 2011-03-17 DIAGNOSIS — I10 Essential (primary) hypertension: Secondary | ICD-10-CM | POA: Insufficient documentation

## 2011-03-17 DIAGNOSIS — F411 Generalized anxiety disorder: Secondary | ICD-10-CM | POA: Insufficient documentation

## 2011-03-17 DIAGNOSIS — F32A Depression, unspecified: Secondary | ICD-10-CM

## 2011-03-17 DIAGNOSIS — F329 Major depressive disorder, single episode, unspecified: Secondary | ICD-10-CM | POA: Insufficient documentation

## 2011-03-17 DIAGNOSIS — I2510 Atherosclerotic heart disease of native coronary artery without angina pectoris: Secondary | ICD-10-CM | POA: Insufficient documentation

## 2011-03-17 DIAGNOSIS — I252 Old myocardial infarction: Secondary | ICD-10-CM | POA: Insufficient documentation

## 2011-03-17 LAB — COMPREHENSIVE METABOLIC PANEL
ALT: 9 U/L (ref 0–35)
AST: 17 U/L (ref 0–37)
Albumin: 4 g/dL (ref 3.5–5.2)
Alkaline Phosphatase: 62 U/L (ref 39–117)
BUN: 7 mg/dL (ref 6–23)
CO2: 22 mEq/L (ref 19–32)
Calcium: 9.7 mg/dL (ref 8.4–10.5)
Chloride: 104 mEq/L (ref 96–112)
Creatinine, Ser: 0.73 mg/dL (ref 0.50–1.10)
GFR calc Af Amer: >90 mL/min (ref >90)
GFR calc non Af Amer: >90 mL/min (ref >90)
Glucose, Bld: 97 mg/dL (ref 70–99)
Potassium: 3.3 mEq/L — ABNORMAL LOW (ref 3.5–5.1)
Sodium: 138 mEq/L (ref 135–145)
Total Bilirubin: 0.2 mg/dL — ABNORMAL LOW (ref 0.3–1.2)
Total Protein: 7.7 g/dL (ref 6.0–8.3)

## 2011-03-17 LAB — CBC
HCT: 38.5 % (ref 36.0–46.0)
Hemoglobin: 12.5 g/dL (ref 12.0–15.0)
MCH: 26.7 pg (ref 26.0–34.0)
MCHC: 32.5 g/dL (ref 30.0–36.0)
MCV: 82.1 fL (ref 78.0–100.0)
Platelets: 345 10*3/uL (ref 150–400)
RBC: 4.69 MIL/uL (ref 3.87–5.11)
RDW: 16.4 % — ABNORMAL HIGH (ref 11.5–15.5)
WBC: 6 10*3/uL (ref 4.0–10.5)

## 2011-03-17 LAB — RAPID URINE DRUG SCREEN, HOSP PERFORMED
Amphetamines: NOT DETECTED
Barbiturates: NOT DETECTED
Benzodiazepines: NOT DETECTED
Cocaine: NOT DETECTED
Opiates: NOT DETECTED
Tetrahydrocannabinol: NOT DETECTED

## 2011-03-17 LAB — ETHANOL: Alcohol, Ethyl (B): <11 mg/dL (ref 0–11)

## 2011-03-17 MED ORDER — IBUPROFEN 600 MG PO TABS: 600.0000 mg | ORAL_TABLET | Freq: Three times a day (TID) | ORAL | Status: DC | PRN

## 2011-03-17 MED ORDER — METOPROLOL TARTRATE 25 MG PO TABS
75.0000 mg | ORAL_TABLET | Freq: Two times a day (BID) | ORAL | Status: DC
  Administered 2011-03-17 – 2011-03-18 (×3): 75 mg via ORAL
  Filled 2011-03-17: qty 3
  Filled 2011-03-17: qty 2
  Filled 2011-03-17: qty 3
  Filled 2011-03-17: qty 1

## 2011-03-17 MED ORDER — ASPIRIN EC 81 MG PO TBEC
81.0000 mg | DELAYED_RELEASE_TABLET | Freq: Every day | ORAL | Status: DC
Start: 2011-03-18 — End: 2011-03-18
  Administered 2011-03-18: 81 mg via ORAL
  Filled 2011-03-17 (×2): qty 1

## 2011-03-17 MED ORDER — LORAZEPAM 1 MG PO TABS: 1.0000 mg | ORAL_TABLET | Freq: Three times a day (TID) | ORAL | Status: DC | PRN

## 2011-03-17 MED ORDER — ALUM & MAG HYDROXIDE-SIMETH 200-200-20 MG/5ML PO SUSP: 30.0000 mL | ORAL | Status: DC | PRN

## 2011-03-17 MED ORDER — PRASUGREL HCL 10 MG PO TABS
10.0000 mg | ORAL_TABLET | Freq: Every day | ORAL | Status: DC
Start: 2011-03-18 — End: 2011-03-18
  Administered 2011-03-18: 10 mg via ORAL
  Filled 2011-03-17 (×2): qty 1

## 2011-03-17 MED ORDER — QUETIAPINE FUMARATE 100 MG PO TABS
200.0000 mg | ORAL_TABLET | Freq: Every day | ORAL | Status: DC
  Administered 2011-03-17 – 2011-03-18 (×2): 200 mg via ORAL
  Filled 2011-03-17 (×2): qty 2

## 2011-03-17 MED ORDER — ONDANSETRON HCL 4 MG PO TABS: 4.0000 mg | ORAL_TABLET | Freq: Three times a day (TID) | ORAL | Status: DC | PRN

## 2011-03-17 MED ORDER — ZOLPIDEM TARTRATE 5 MG PO TABS
5.0000 mg | ORAL_TABLET | Freq: Every evening | ORAL | Status: DC | PRN
  Administered 2011-03-17: 5 mg via ORAL
  Filled 2011-03-17: qty 1

## 2011-03-17 MED ORDER — SIMVASTATIN 40 MG PO TABS
40.0000 mg | ORAL_TABLET | Freq: Every day | ORAL | Status: DC
  Administered 2011-03-17 – 2011-03-18 (×2): 40 mg via ORAL
  Filled 2011-03-17 (×3): qty 1

## 2011-03-17 MED ORDER — ACETAMINOPHEN 325 MG PO TABS: 650.0000 mg | ORAL_TABLET | ORAL | Status: DC | PRN

## 2011-03-17 NOTE — ED Notes (Signed)
Pt in c/o SI, states she has been off her medication for bipolar and depression x2 months, denies ETOH or drug use

## 2011-03-17 NOTE — ED Provider Notes (Signed)
History     CSN: [id number]  Arrival date & time 03/17/11  2054   First MD Initiated Contact with Patient 03/17/11 2125      Chief Complaint  Patient presents with  . Medical Clearance    (Consider location/radiation/quality/duration/timing/severity/associated sxs/prior treatment) Patient is a 46 y.o. female presenting with mental health disorder. The history is provided by the patient. No language interpreter was used.  Mental Health Problem The primary symptoms do not include hallucinations. The current episode started more than 2 weeks ago. This is a new problem.  The onset of the illness is precipitated by a stressful event (off medications). The degree of incapacity that she is experiencing as a consequence of her illness is moderate. Sequelae of the illness include an inability to care for self. Additional symptoms of the illness include anhedonia, insomnia, feelings of worthlessness and poor judgment. Additional symptoms of the illness do not include no appetite change, no fatigue, no flight of ideas, no inflated self-esteem, no headaches or no abdominal pain. She admits to suicidal ideas. She does have a plan to commit suicide. She does not contemplate harming herself. She has not already injured self. She does not contemplate injuring another person. She has not already  injured another person. Risk factors that are present for mental illness include a history of mental illness and substance abuse.    Past Medical History  Diagnosis Date  . CAD (coronary artery disease)     a. NSTEMI 8/12 tx with DES to [facility] and DES to pRCA;  Cardiac cath 09/29/10: oLAD 25%, m-dLAD 25-30%, mLAD 40%, D1 30%, mCFX 99%, pOM1 25%, pRCA 80% and 70%, oPDA 25%, EF 60%;  echo 8/12: EF 55-60%, mod LVH  . Hypertension   . Bipolar disorder   . GERD (gastroesophageal reflux disease)   . Dyslipidemia   . Tobacco abuse   . History of alcohol abuse   . Myocardial infarction     Past Surgical History    Procedure Date  . Cardiac catheterization     Family History  Problem Relation Age of Onset  . Hypertension Mother   . Mental illness Mother     History  Substance Use Topics  . Smoking status: Current Everyday Smoker    Types: Cigarettes  . Smokeless tobacco: Not on file  . Alcohol Use: No     REMOTE USE    OB History    Grav Para Term Preterm Abortions TAB SAB Ect Mult Living                  Review of Systems  Constitutional: Negative for fever, chills, activity change, appetite change and fatigue.  HENT: Negative for congestion, sore throat, rhinorrhea, neck pain and neck stiffness.   Respiratory: Negative for cough and shortness of breath.   Cardiovascular: Negative for chest pain and palpitations.  Gastrointestinal: Negative for nausea, vomiting and abdominal pain.  Genitourinary: Negative for dysuria, urgency, frequency and flank pain.  Neurological: Negative for dizziness, weakness, light-headedness, numbness and headaches.  Psychiatric/Behavioral: Positive for suicidal ideas. Negative for hallucinations. The patient is nervous/anxious and has insomnia.   All other systems reviewed and are negative.    Allergies  Aspirin; Codeine; and Vicodin  Home Medications   Current Outpatient Rx  Name Route Sig Dispense Refill  . ASPIRIN EC 81 MG PO TBEC Oral Take 81 mg by mouth daily.      Marland Kitchen METOPROLOL TARTRATE 25 MG PO TABS Oral Take 75 mg by mouth  2 (two) times daily.      Marland Kitchen NITROGLYCERIN 0.4 MG SL SUBL Sublingual Place 0.4 mg under the tongue every 5 (five) minutes as needed. For chest pain.     Marland Kitchen PRASUGREL HCL 10 MG PO TABS Oral Take 10 mg by mouth daily.      . QUETIAPINE FUMARATE 200 MG PO TABS Oral Take 200 mg by mouth at bedtime.      Marland Kitchen SIMVASTATIN 40 MG PO TABS Oral Take 40 mg by mouth at bedtime.        BP 159/98  Pulse 72  Temp(Src) 98.7 F (37.1 C) (Oral)  Resp 12  SpO2 100%  Physical Exam  Nursing note and vitals reviewed. Constitutional: She  is oriented to person, place, and time. She appears well-developed and well-nourished. No distress.  HENT:  Head: Normocephalic and atraumatic.  Mouth/Throat: Oropharynx is clear and moist.  Eyes: Conjunctivae and EOM are normal. Pupils are equal, round, and reactive to light.  Neck: Normal range of motion. Neck supple.  Cardiovascular: Normal rate, regular rhythm, normal heart sounds and intact distal pulses.  Exam reveals no gallop and no friction rub.   No murmur heard. Pulmonary/Chest: Effort normal and breath sounds normal.  Abdominal: Soft. Bowel sounds are normal. There is no tenderness.  Musculoskeletal: Normal range of motion. She exhibits no tenderness.  Neurological: She is alert and oriented to person, place, and time. No cranial nerve deficit.  Skin: Skin is warm and dry.  Psychiatric: Her speech is normal. Her affect is blunt. She is slowed and withdrawn. She exhibits a depressed mood. She expresses suicidal ideation. She expresses no homicidal ideation. She expresses suicidal plans. She expresses no homicidal plans.    ED Course  Procedures (including critical care time)   Labs Reviewed  URINE RAPID DRUG SCREEN (HOSP PERFORMED)  CBC  COMPREHENSIVE METABOLIC PANEL  ETHANOL   No results found.   1. Depression   2. Suicidal ideations       MDM  Depression with suicidal ideation and plan jumped from a car. Has a history of psychiatric problems. Has not been taking her medications. Medical clearance labs were obtained. She is medically clear for psychiatric evaluation. I discussed the case with the ACT team. tele psych consult was placed.  Psych orders and home meds placed.  dispo per psych recs        Dayton Bailiff, MD 03/17/11 2144

## 2011-03-18 ENCOUNTER — Inpatient Hospital Stay (HOSPITAL_COMMUNITY)
Admission: AD | Admit: 2011-03-18 | Discharge: 2011-03-24 | DRG: 885 | Disposition: A | Discharge status: Home / Self Care | Payer: Medicaid Other | Source: Ambulatory Visit | Attending: Psychiatry | Admitting: Psychiatry

## 2011-03-18 DIAGNOSIS — Z7982 Long term (current) use of aspirin: Secondary | ICD-10-CM

## 2011-03-18 DIAGNOSIS — Z818 Family history of other mental and behavioral disorders: Secondary | ICD-10-CM

## 2011-03-18 DIAGNOSIS — I2510 Atherosclerotic heart disease of native coronary artery without angina pectoris: Secondary | ICD-10-CM

## 2011-03-18 DIAGNOSIS — K219 Gastro-esophageal reflux disease without esophagitis: Secondary | ICD-10-CM

## 2011-03-18 DIAGNOSIS — R45851 Suicidal ideations: Secondary | ICD-10-CM

## 2011-03-18 DIAGNOSIS — F1110 Opioid abuse, uncomplicated: Secondary | ICD-10-CM

## 2011-03-18 DIAGNOSIS — E785 Hyperlipidemia, unspecified: Secondary | ICD-10-CM

## 2011-03-18 DIAGNOSIS — F17200 Nicotine dependence, unspecified, uncomplicated: Secondary | ICD-10-CM

## 2011-03-18 DIAGNOSIS — F3130 Bipolar disorder, current episode depressed, mild or moderate severity, unspecified: Principal | ICD-10-CM

## 2011-03-18 DIAGNOSIS — I252 Old myocardial infarction: Secondary | ICD-10-CM

## 2011-03-18 DIAGNOSIS — F1310 Sedative, hypnotic or anxiolytic abuse, uncomplicated: Secondary | ICD-10-CM

## 2011-03-18 DIAGNOSIS — I10 Essential (primary) hypertension: Secondary | ICD-10-CM

## 2011-03-18 DIAGNOSIS — Z888 Allergy status to other drugs, medicaments and biological substances status: Secondary | ICD-10-CM

## 2011-03-18 DIAGNOSIS — Z79899 Other long term (current) drug therapy: Secondary | ICD-10-CM

## 2011-03-18 DIAGNOSIS — F1011 Alcohol abuse, in remission: Secondary | ICD-10-CM

## 2011-03-18 DIAGNOSIS — Z9861 Coronary angioplasty status: Secondary | ICD-10-CM

## 2011-03-18 MED ORDER — NICOTINE 21 MG/24HR TD PT24
21.0000 mg | MEDICATED_PATCH | Freq: Once | TRANSDERMAL | Status: DC
  Administered 2011-03-18: 21 mg via TRANSDERMAL
  Filled 2011-03-18: qty 1

## 2011-03-18 MED ORDER — POTASSIUM CHLORIDE CRYS ER 20 MEQ PO TBCR
20.0000 meq | EXTENDED_RELEASE_TABLET | Freq: Once | ORAL | Status: AC
  Administered 2011-03-18: 20 meq via ORAL
  Filled 2011-03-18: qty 1

## 2011-03-18 NOTE — ED Notes (Signed)
Act in to speak with pt 

## 2011-03-18 NOTE — BH Assessment (Signed)
Assessment Note   [name] is a 46 y.o. female who presents to [facility] voluntarily endorsing SI. Pt reports having thoughts of wanting to hurt herself, feeling helpless, and feels like the world is out to get her. Pt refused to give details about Suicide plan, stating "why would I tell you my plan". Pt reports experiencing SI for past week. Pt reports history of bipolar disorder, stating she was diagnosed at age 58. Pt states she was prescribed Seroquel but has stopped taking it due to fear of becoming addicted to prescription medications. Pt states she is currently not taking any medications. Pt states she has not slept in 1 week, stating she paces the floor all night. Pt also states she has had no appetite for the past 2 weeks, stating she has lost 8 pounds in that time. Pt reports she has difficulty concentrating and has been feeling "real weird". Pt was vague in detail about recent stressors, stating she has "so much going on right now". Pt reported prior in-patient history, stating she received services at [facility] in 08/2010 after cutting her wrists. Pt reports experiencing crying spells daily. Pt also reports having panic attacks 4 times daily, with most recent attack being 2 weeks ago. In addition pt reports "my thoughts get scrambled".  Pt denies any current or past HI, SA, and AHVH. Pt is seeking inpatient treatment to assist her with "grasping reality".      Axis I: Bipolar Disorder NOS Axis II: Deferred Axis III:  Past Medical History  Diagnosis Date  . CAD (coronary artery disease)     a. NSTEMI 8/12 tx with DES to [facility] and DES to pRCA;  Cardiac cath 09/29/10: oLAD 25%, m-dLAD 25-30%, mLAD 40%, D1 30%, mCFX 99%, pOM1 25%, pRCA 80% and 70%, oPDA 25%, EF 60%;  echo 8/12: EF 55-60%, mod LVH  . Hypertension   . Bipolar disorder   . GERD (gastroesophageal reflux disease)   . Dyslipidemia   . Tobacco abuse   . History of alcohol abuse   . Myocardial infarction    Axis IV: other psychosocial or  environmental problems Axis V: 31-40 impairment in reality testing  Past Medical History:  Past Medical History  Diagnosis Date  . CAD (coronary artery disease)     a. NSTEMI 8/12 tx with DES to [facility] and DES to pRCA;  Cardiac cath 09/29/10: oLAD 25%, m-dLAD 25-30%, mLAD 40%, D1 30%, mCFX 99%, pOM1 25%, pRCA 80% and 70%, oPDA 25%, EF 60%;  echo 8/12: EF 55-60%, mod LVH  . Hypertension   . Bipolar disorder   . GERD (gastroesophageal reflux disease)   . Dyslipidemia   . Tobacco abuse   . History of alcohol abuse   . Myocardial infarction     Past Surgical History  Procedure Date  . Cardiac catheterization     Family History:  Family History  Problem Relation Age of Onset  . Hypertension Mother   . Mental illness Mother     Social History:  reports that she has been smoking Cigarettes.  She does not have any smokeless tobacco history on file. She reports that she uses illicit drugs (Benzodiazepines and Opium). She reports that she does not drink alcohol.  Additional Social History:  Alcohol / Drug Use History of alcohol / drug use?: No history of alcohol / drug abuse Allergies:  Allergies  Allergen Reactions  . Aspirin   . Codeine Nausea And Vomiting  . Vicodin (Hydrocodone-Acetaminophen) Nausea And Vomiting  Home Medications:  Medications Prior to Admission  Medication Dose Route Frequency Provider Last Rate Last Dose  . acetaminophen (TYLENOL) tablet 650 mg  650 mg Oral Q4H PRN Dayton Bailiff, MD      . alum & mag hydroxide-simeth (MAALOX/MYLANTA) 200-200-20 MG/5ML suspension 30 mL  30 mL Oral PRN Dayton Bailiff, MD      . aspirin EC tablet 81 mg  81 mg Oral Daily Dayton Bailiff, MD      . ibuprofen (ADVIL,MOTRIN) tablet 600 mg  600 mg Oral Q8H PRN Dayton Bailiff, MD      . LORazepam (ATIVAN) tablet 1 mg  1 mg Oral Q8H PRN Dayton Bailiff, MD      . metoprolol tartrate (LOPRESSOR) tablet 75 mg  75 mg Oral BID Dayton Bailiff, MD   75 mg at 03/17/11 2252  . ondansetron (ZOFRAN) tablet 4  mg  4 mg Oral Q8H PRN Dayton Bailiff, MD      . prasugrel (EFFIENT) tablet 10 mg  10 mg Oral Daily Dayton Bailiff, MD      . QUEtiapine (SEROQUEL) tablet 200 mg  200 mg Oral QHS Dayton Bailiff, MD   200 mg at 03/17/11 2251  . simvastatin (ZOCOR) tablet 40 mg  40 mg Oral QHS Dayton Bailiff, MD   40 mg at 03/17/11 2251  . zolpidem (AMBIEN) tablet 5 mg  5 mg Oral QHS PRN Dayton Bailiff, MD   5 mg at 03/17/11 2251   Medications Prior to Admission  Medication Sig Dispense Refill  . aspirin EC 81 MG tablet Take 81 mg by mouth daily.        . metoprolol tartrate (LOPRESSOR) 25 MG tablet Take 75 mg by mouth 2 (two) times daily.        . prasugrel (EFFIENT) 10 MG TABS Take 10 mg by mouth daily.        . simvastatin (ZOCOR) 40 MG tablet Take 40 mg by mouth at bedtime.          OB/GYN Status:  No LMP recorded.  General Assessment Data Living Arrangements: Alone Can pt return to current living arrangement?: Yes Admission Status: Voluntary Is patient capable of signing voluntary admission?: Yes Transfer from: Acute Hospital Referral Source: Self/Family/Friend  Education Status Is patient currently in school?: Yes Name of school: [employer]  Risk to self Suicidal Ideation: Yes-Currently Present Suicidal Intent: Yes-Currently Present Is patient at risk for suicide?: Yes Suicidal Plan?: No Access to Means: No What has been your use of drugs/alcohol within the last 12 months?: denies Previous Attempts/Gestures: Yes How many times?: 1  Other Self Harm Risks: none Triggers for Past Attempts: Unpredictable Intentional Self Injurious Behavior: None Family Suicide History: No Recent stressful life event(s): Other (Comment) (states she has a lot going on) Persecutory voices/beliefs?: No Depression: Yes Depression Symptoms: Tearfulness Substance abuse history and/or treatment for substance abuse?: No Suicide prevention information given to non-admitted patients: Not applicable  Risk to Others Homicidal Ideation:  No Thoughts of Harm to Others: No Current Homicidal Intent: No Current Homicidal Plan: No Access to Homicidal Means: No Identified Victim: none History of harm to others?: No Assessment of Violence: None Noted Violent Behavior Description: none Does patient have access to weapons?: No Criminal Charges Pending?: No Does patient have a court date: No  Psychosis Hallucinations: None noted Delusions: None noted  Mental Status Report Appear/Hygiene: Disheveled Eye Contact: Poor Motor Activity: Unremarkable Speech: Soft Level of Consciousness: Drowsy Mood: Irritable;Depressed Affect: Depressed Anxiety Level: Panic Attacks Panic  attack frequency: 4 times a day Most recent panic attack: 2 weeks ago Thought Processes: Coherent;Relevant Judgement: Impaired Orientation: Person;Place;Time;Situation Obsessive Compulsive Thoughts/Behaviors: None  Cognitive Functioning Concentration: Normal Memory: Recent Intact;Remote Intact IQ: Average Insight: Poor Impulse Control: Poor Appetite: Poor Weight Loss: 8  (last 2 weeks) Weight Gain: 0  Sleep: Decreased Total Hours of Sleep: 1  (for past 1 week) Vegetative Symptoms: None  Prior Inpatient Therapy Prior Inpatient Therapy: Yes Prior Therapy Dates: 08/2010 Prior Therapy Facilty/Provider(s): [facility] Reason for Treatment: SI  Prior Outpatient Therapy Prior Outpatient Therapy: No Prior Therapy Dates: n/a Prior Therapy Facilty/Provider(s): n/a Reason for Treatment: n/a  ADL Screening (condition at time of admission) Patient's cognitive ability adequate to safely complete daily activities?: Yes Patient able to express need for assistance with ADLs?: Yes Independently performs ADLs?: Yes Weakness of Legs: None Weakness of Arms/Hands: None  Home Assistive Devices/Equipment Home Assistive Devices/Equipment: None    Abuse/Neglect Assessment (Assessment to be complete while patient is alone) Physical Abuse: Denies Verbal Abuse:  Denies Sexual Abuse: Denies Exploitation of patient/patient's resources: Denies Self-Neglect: Denies Values / Beliefs Cultural Requests During Hospitalization: None Spiritual Requests During Hospitalization: None   Advance Directives (For Healthcare) Advance Directive: Patient does not have advance directive Nutrition Screen Diet: Regular Unintentional weight loss greater than 10lbs within the last month: No Dysphagia: No Home Tube Feeding or Total Parenteral Nutrition (TPN): No Patient appears severely malnourished: No Pregnant or Lactating: No  Additional Information 1:1 In Past 12 Months?: No CIRT Risk: No Elopement Risk: No Does patient have medical clearance?: Yes     Disposition:  Disposition Disposition of Patient: Referred to;Inpatient treatment program Type of inpatient treatment program: Adult Patient referred to:  [facility])  On Site Evaluation by:   Reviewed with Physician:     Marjean Donna 03/18/2011 2:37 AM

## 2011-03-18 NOTE — ED Provider Notes (Signed)
  Physical Exam  BP 124/75  Pulse 98  Temp(Src) 97.8 F (36.6 C) (Oral)  Resp 57  SpO2 98%  Physical Exam  ED Course  Procedures  MDM Vitals stable.  K+ is slightly low at 3.3 which is not considered to be medically significant. Will give 20 meq orally.  Pt is here voluntarily with SI< probably a plan, but will not share.  Under review at [facility] currently.  No issues overnight.       Gavin Pound. Oletta Lamas, MD 03/18/11 [id number]

## 2011-03-19 ENCOUNTER — Encounter (HOSPITAL_COMMUNITY): Payer: Self-pay

## 2011-03-19 DIAGNOSIS — F1110 Opioid abuse, uncomplicated: Secondary | ICD-10-CM | POA: Insufficient documentation

## 2011-03-19 DIAGNOSIS — F1310 Sedative, hypnotic or anxiolytic abuse, uncomplicated: Secondary | ICD-10-CM | POA: Insufficient documentation

## 2011-03-19 DIAGNOSIS — F1011 Alcohol abuse, in remission: Secondary | ICD-10-CM

## 2011-03-19 DIAGNOSIS — F17200 Nicotine dependence, unspecified, uncomplicated: Secondary | ICD-10-CM

## 2011-03-19 HISTORY — DX: Sedative, hypnotic or anxiolytic abuse, uncomplicated: F13.10

## 2011-03-19 HISTORY — DX: Opioid abuse, uncomplicated: F11.10

## 2011-03-19 MED ORDER — ASPIRIN EC 81 MG PO TBEC: 81.0000 mg | DELAYED_RELEASE_TABLET | Freq: Every day | ORAL | Status: DC

## 2011-03-19 MED ORDER — LORAZEPAM 1 MG PO TABS
1.0000 mg | ORAL_TABLET | Freq: Three times a day (TID) | ORAL | Status: DC | PRN
  Administered 2011-03-19: 1 mg via ORAL
  Filled 2011-03-19: qty 1

## 2011-03-19 MED ORDER — ESCITALOPRAM OXALATE 20 MG PO TABS
20.0000 mg | ORAL_TABLET | Freq: Every day | ORAL | Status: DC
  Administered 2011-03-19 – 2011-03-24 (×6): 20 mg via ORAL
  Filled 2011-03-19: qty 2
  Filled 2011-03-19 (×6): qty 1

## 2011-03-19 MED ORDER — PRAZOSIN HCL 1 MG PO CAPS
1.0000 mg | ORAL_CAPSULE | Freq: Every evening | ORAL | Status: DC | PRN
  Administered 2011-03-19 – 2011-03-22 (×6): 1 mg via ORAL
  Filled 2011-03-19 (×10): qty 1

## 2011-03-19 MED ORDER — SIMVASTATIN 40 MG PO TABS
40.0000 mg | ORAL_TABLET | Freq: Every day | ORAL | Status: DC
  Administered 2011-03-19 – 2011-03-23 (×5): 40 mg via ORAL
  Filled 2011-03-19 (×7): qty 1

## 2011-03-19 MED ORDER — ASPIRIN EC 81 MG PO TBEC
81.0000 mg | DELAYED_RELEASE_TABLET | Freq: Every day | ORAL | Status: DC
  Administered 2011-03-19 – 2011-03-24 (×6): 81 mg via ORAL
  Filled 2011-03-19 (×7): qty 1

## 2011-03-19 MED ORDER — PRASUGREL HCL 10 MG PO TABS
10.0000 mg | ORAL_TABLET | Freq: Every day | ORAL | Status: DC
  Administered 2011-03-19 – 2011-03-24 (×6): 10 mg via ORAL
  Filled 2011-03-19 (×8): qty 1

## 2011-03-19 MED ORDER — ACETAMINOPHEN 325 MG PO TABS: 650.0000 mg | ORAL_TABLET | Freq: Four times a day (QID) | ORAL | Status: DC | PRN

## 2011-03-19 MED ORDER — ALUM & MAG HYDROXIDE-SIMETH 200-200-20 MG/5ML PO SUSP: 30.0000 mL | ORAL | Status: DC | PRN

## 2011-03-19 MED ORDER — METOPROLOL TARTRATE 50 MG PO TABS: 75.0000 mg | ORAL_TABLET | Freq: Two times a day (BID) | ORAL | Status: DC

## 2011-03-19 MED ORDER — IBUPROFEN 600 MG PO TABS
600.0000 mg | ORAL_TABLET | Freq: Three times a day (TID) | ORAL | Status: DC | PRN
  Administered 2011-03-22 – 2011-03-23 (×2): 600 mg via ORAL
  Filled 2011-03-19 (×2): qty 1

## 2011-03-19 MED ORDER — NICOTINE 21 MG/24HR TD PT24
21.0000 mg | MEDICATED_PATCH | Freq: Once | TRANSDERMAL | Status: AC
  Administered 2011-03-19: 21 mg via TRANSDERMAL
  Filled 2011-03-19: qty 1

## 2011-03-19 MED ORDER — HYDROXYZINE PAMOATE 25 MG PO CAPS
25.0000 mg | ORAL_CAPSULE | Freq: Every evening | ORAL | Status: DC | PRN
  Administered 2011-03-19 – 2011-03-22 (×2): 25 mg via ORAL

## 2011-03-19 MED ORDER — MAGNESIUM HYDROXIDE 400 MG/5ML PO SUSP: 30.0000 mL | Freq: Every day | ORAL | Status: DC | PRN

## 2011-03-19 MED ORDER — METOPROLOL TARTRATE 50 MG PO TABS
75.0000 mg | ORAL_TABLET | Freq: Two times a day (BID) | ORAL | Status: DC
  Administered 2011-03-19 – 2011-03-24 (×10): 75 mg via ORAL
  Filled 2011-03-19 (×14): qty 1

## 2011-03-19 MED ORDER — QUETIAPINE FUMARATE 200 MG PO TABS
200.0000 mg | ORAL_TABLET | Freq: Every day | ORAL | Status: DC
  Filled 2011-03-19: qty 1

## 2011-03-19 MED ORDER — SIMVASTATIN 40 MG PO TABS: 40.0000 mg | ORAL_TABLET | Freq: Every day | ORAL | Status: DC

## 2011-03-19 MED ORDER — CHLORPROMAZINE HCL 25 MG PO TABS
25.0000 mg | ORAL_TABLET | Freq: Three times a day (TID) | ORAL | Status: DC
  Administered 2011-03-19 – 2011-03-22 (×8): 25 mg via ORAL
  Filled 2011-03-19 (×10): qty 1

## 2011-03-19 MED ORDER — DIVALPROEX SODIUM ER 500 MG PO TB24
500.0000 mg | ORAL_TABLET | Freq: Every day | ORAL | Status: DC
  Administered 2011-03-19 – 2011-03-21 (×3): 500 mg via ORAL
  Filled 2011-03-19 (×4): qty 1

## 2011-03-19 NOTE — Tx Team (Signed)
Initial Interdisciplinary Treatment Plan  PATIENT STRENGTHS: (choose at least two) Ability for insight Active sense of humor General fund of knowledge Motivation for treatment/growth Supportive family/friends Work skills  PATIENT STRESSORS: Financial difficulties   PROBLEM LIST: Problem List/Patient Goals Date to be addressed Date deferred Reason deferred Estimated date of resolution  Increased risk for SI  2/8     Depression 2/8                                                DISCHARGE CRITERIA:  Improved stabilization in mood, thinking, and/or behavior Need for constant or close observation no longer present Reduction of life-threatening or endangering symptoms to within safe limits Verbal commitment to aftercare and medication compliance  PRELIMINARY DISCHARGE PLAN: Outpatient therapy Return to previous living arrangement  PATIENT/FAMIILY INVOLVEMENT: This treatment plan has been presented to and reviewed with the patient, [name].  The patient and family have been given the opportunity to ask questions and make suggestions.  Talitha Givens, RN 03/19/2011, 7:51 AM

## 2011-03-19 NOTE — Progress Notes (Signed)
Pt presents this evening with recent SI that has decreased upon admission. Pt reports wanting to get back on track with meds. Pt also reports the acceptance of her Bipolar diagnosis with the requirement of med management for mood stabilization. 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 Pt reports being off meds for 2 months. Pt has a hx of etoh abuse. Pt contracts for safety at this time.  

## 2011-03-19 NOTE — Progress Notes (Signed)
[facility] Group Notes:  (Counselor/Nursing/MHT/Case Management/Adjunct)  03/19/2011 3:17 PM  Type of Therapy:  1:15PM Mental Health Association Group  Participation Level:  Minimal  Participation Quality:  Appropriate and Attentive  Affect:  Appropriate  Cognitive:  Alert and Appropriate  Insight:  Limited  Engagement in Group:  Limited  Engagement in Therapy:  Limited  Modes of Intervention:  Education  Summary of Progress/Problems: Patient appeared engaged in group discussion about the Mental Health Association.    Wilmon Arms 03/19/2011, 3:17 PM Cosigned by: Angus Palms, LCSW

## 2011-03-19 NOTE — H&P (Signed)
Medical/psychiatric screening examination/treatment/procedure(s) were performed by non-physician practitioner and as supervising physician I was immediately available for consultation/collaboration.   

## 2011-03-19 NOTE — Progress Notes (Signed)
Adult Psychosocial Assessment Update Interdisciplinary Team  Previous [facility] admissions/discharges:  Admissions Discharges  Date:   09/02/10 Date:   09/08/10  Date: Date:  Date: Date:  Date: Date:  Date: Date:   Changes since the last Psychosocial Assessment (including adherence to outpatient mental health and/or substance abuse treatment, situational issues contributing to decompensation and/or relapse). Patient reports she has been feeling depressed. Patient vague about current stressors,   Pt states "I just have a lot of things going on right now". Patient reports she has stopped  Taking her medications because she was trying to prove to herself that "she is not sick".  Patient also states she is feeling really low and helpless. Patient states  a lot of things  She has done over the years have escalated into her being hospitalized. Patient then  States "I have realized that my life is unmanageable".   Discharge Plan 1. Will you be returning to the same living situation after discharge?   Yes: No:  X    If no, what is your plan?    Patient reports she should have a place to go next Friday, but still vague and unaware of where she will reside after discharge. Patient reports she was  Evicted because she is behind 3 months on her rent. Patient states landlord told her she   Can return to her apartment if she pays $1,900.00 by Feb. 18th.   2. Would you like a referral for services when you are discharged? Yes:  X   If yes, for what services?  No:       Patient would like referrals in [facility]. However, patient reports she has an   Appointment with her therapist at [facility] on March 1st.     Summary and Recommendations (to be completed by the evaluator) Patient is a 46 year old African American female. Patient admitted with diagnosis of  Bipolar Disorder NOS. Patient reports she has stopped taking her medications because  She was trying to prove to herself  that "she was not sick". Patient would benefit from  Crisis stabilization, medication evaluation, psychoed and group therapy, and case  Management for discharge planning.            Cosigned by: Angus Palms, LCSW    Signature:  Wilmon Arms, 03/19/2011 9:41 AM
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
## 2011-03-19 NOTE — Progress Notes (Signed)
Recreation Therapy Notes  03/19/2011         Time: 1415      Group Topic/Focus: The focus of the group is on enhancing the patients' ability to cope with stressors by understanding what coping is, why it is important, the negative effects of stress and developing healthier coping skills.  Participation Level: Active  Participation Quality: Attentive  Affect: Appropriate  Cognitive: Oriented   Additional Comments: None.   [name] 03/19/2011 3:01 PM 

## 2011-03-19 NOTE — Progress Notes (Signed)
Patient seen during d/c planning group.  She reports admitting to the hospital due to being off medications for more than a month.  She advised that she wanted to prove to herself that she was not sick and has ended up in the hospital.  She denies SI/HI, depression rated at nine/ten; anxiety at six, and helplessness/hopelessness at nine.  She reports being followed outpatient by [facility].  She reports being uncertain where she will live as she has been evicted from her apartment.

## 2011-03-19 NOTE — Progress Notes (Signed)
Pt presents this evening with recent SI that has decreased upon admission. Pt reports wanting to get back on track with meds. Pt also reports the acceptance of her Bipolar diagnosis with the requirement of med management for mood stabilization. 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 Pt reports being off meds for 2 months. Pt has a hx of etoh abuse. Pt contracts for safety at this time.

## 2011-03-19 NOTE — Progress Notes (Signed)
[facility] Group Notes:  (Counselor/Nursing/MHT/Case Management/Adjunct)  03/19/2011 12:21 PM  Type of Therapy:  Group Therapy  Participation Level:  None  Participation Quality:  Drowsy  Affect:  Appropriate  Cognitive:  Appropriate  Insight:  None  Engagement in Group:  None  Engagement in Therapy:  Limited  Modes of Intervention:  Support  Summary of Progress/Problems:Did not participate in group, but at the end pulled therapist aside to indicate that, in thinking about her values, she realized she would like to contribute to the community more and is interested in engaging in community service. She also reported an intent to take her medications regularly and focus on remaining well.  Mendleson, Jenna 03/19/2011, 12:21 PM

## 2011-03-20 MED ORDER — NICOTINE 14 MG/24HR TD PT24
MEDICATED_PATCH | TRANSDERMAL | Status: AC
  Administered 2011-03-20: 14 mg via TRANSDERMAL
  Filled 2011-03-20: qty 1

## 2011-03-20 MED ORDER — NICOTINE 14 MG/24HR TD PT24
14.0000 mg | MEDICATED_PATCH | Freq: Every day | TRANSDERMAL | Status: DC
  Administered 2011-03-20 – 2011-03-22 (×3): 14 mg via TRANSDERMAL
  Filled 2011-03-20 (×5): qty 1
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
## 2011-03-20 NOTE — Progress Notes (Signed)
Patient ID: [name], female   DOB: [birth year], 46 y.o.   MRN: [id number]   Pt was seen today. Wants her sleeping meds to be increased. Was on bed today during day time. Cooperative, eating well,  Reports non-compliance with meds before admission.  Mental Status Examination/Evaluation:  Objective: Appearance: Disheveled   Eye Contact:: Good   Speech: Normal Rate   Volume: Normal   Mood: Anxious   Affect: Appropriate   Thought Process: Logical   Orientation: Full   Thought Content: appropriate   Suicidal Thoughts: Yes. without intent/plan   Homicidal Thoughts: No   Memory: Immediate; Good  Recent; Good  Remote; Good   Judgement: Fair   Insight: Fair   Psychomotor Activity: Normal   Concentration: Fair   Recall: Fair   Akathisia: No   Handed: Right   AIMS (if indicated): 0    Laboratory/X-Ray  Psychological Evaluation(s)      Assessment:  AXIS I: Bipolar, Depressed  AXIS II: no diagnosis  AXIS III:  Past Medical History   Diagnosis  Date   .  CAD (coronary artery disease)      a. NSTEMI 8/12 tx with DES to [facility] and DES to pRCA; Cardiac cath 09/29/10: oLAD 25%, m-dLAD 25-30%, mLAD 40%, D1 30%, mCFX 99%, pOM1 25%, pRCA 80% and 70%, oPDA 25%, EF 60%; echo 8/12: EF 55-60%, mod LVH   .  Hypertension    .  Bipolar disorder    .  GERD (gastroesophageal reflux disease)    .  Dyslipidemia    .  Tobacco abuse    .  History of alcohol abuse    .  Myocardial infarction      Sep 28, 2010    AXIS IV: Parenting stressors   Treatment Plan/Recommendations:  Treatment Plan Summary:     1. Continue current meds.   2. encouraged to stay active and no day time naps to sleep better at night

## 2011-03-20 NOTE — Progress Notes (Signed)
[facility] Adult Inpatient Family/Significant Other Suicide Prevention Education  Suicide Prevention Education:  Education Completed; Janet Limbo Pfluger-[phone](Pt.'s daughter) has been identified by the patient as the family member/significant other with whom the patient will be residing, and identified as the person(s) who will aid the patient in the event of a mental health crisis (suicidal ideations/suicide attempt).  With written consent from the patient, the family member/significant other has been provided the following suicide prevention education, prior to the and/or following the discharge of the patient.  The suicide prevention education provided includes the following:  Suicide risk factors  Suicide prevention and interventions  National Suicide Hotline telephone number  [facility] assessment telephone number  [facility] Emergency Assistance 911  [facility] and/or Residential Mobile Crisis Unit telephone number  Request made of family/significant other to:  Remove weapons (e.g., guns, rifles, knives), all items previously/currently identified as safety concern.  Pt. daughter reports there are no guns in home. Pt lives with her 52  Son and pt.'s daughter report the pt. Will return there after d/c.  Remove drugs/medications (over-the-counter, prescriptions, illicit drugs), all items previously/currently identified as a safety concern.  The family member/significant other verbalizes understanding of the suicide prevention education information provided.  The family member/significant other agrees to remove the items of safety concern listed above.  [name] 03/20/2011, 11:29 AM

## 2011-03-20 NOTE — Progress Notes (Signed)
Pt reports decreased in depression and a more stabilized mood with the start of her Depakote. Pt was relieved to be started on her Lexapro again. Pt has been observed interacting appropriately on the unit. Continued support and availability as needed has been extended to this pt. Pt safety remains with q6min checks.

## 2011-03-20 NOTE — Progress Notes (Signed)
Pt attended and participated in aftercare planning group. Pt was given suicide prevention information as well as crisis hotlines and numbers to utilize. Pt was drowsy but attentive. Pt stated that the new medication that she is taking makes her drowsy. Pt states that she has been depressed and she is at [facility] for medication management. Pt states "I know what I need to do while I'm here".

## 2011-03-21 NOTE — Progress Notes (Signed)
Patient ID: [name], female   DOB: 1965/02/26, 46 y.o.   MRN: [id number]   Pt was seen today. Slept better last night. Attending groups. Cooperative, eating well,  Reports non-compliance with meds before admission. Thinks meds are helping now.  Mental Status Examination/Evaluation:  Objective: Appearance: Disheveled   Eye Contact:: Good   Speech: Normal Rate   Volume: Normal   Mood: Anxious   Affect: Appropriate   Thought Process: Logical   Orientation: Full   Thought Content: appropriate   Suicidal Thoughts: Yes. without intent/plan   Homicidal Thoughts: No   Memory: Immediate; Good  Recent; Good  Remote; Good   Judgement: Fair   Insight: Fair   Psychomotor Activity: Normal   Concentration: Fair   Recall: Fair   Akathisia: No   Handed: Right   AIMS (if indicated): 0    Laboratory/X-Ray  Psychological Evaluation(s)      Assessment:  AXIS I: Bipolar, Depressed  AXIS II: no diagnosis  AXIS III:  Past Medical History   Diagnosis  Date   .  CAD (coronary artery disease)      a. NSTEMI 8/12 tx with DES to [facility] and DES to pRCA; Cardiac cath 09/29/10: oLAD 25%, m-dLAD 25-30%, mLAD 40%, D1 30%, mCFX 99%, pOM1 25%, pRCA 80% and 70%, oPDA 25%, EF 60%; echo 8/12: EF 55-60%, mod LVH   .  Hypertension    .  Bipolar disorder    .  GERD (gastroesophageal reflux disease)    .  Dyslipidemia    .  Tobacco abuse    .  History of alcohol abuse    .  Myocardial infarction      Sep 28, 2010    AXIS IV: Parenting stressors   Treatment Plan/Recommendations:  Treatment Plan Summary:     1. Continue current meds.   2. encouraged to stay active and no day time naps to sleep better at night

## 2011-03-21 NOTE — Progress Notes (Signed)
Pt attended and participated in aftercare planning group. Pt received suicide prevention information and crisis numbers to use. Pt stated that she is feeling good today and did not have any questions about D/C or aftercare plans.

## 2011-03-21 NOTE — Progress Notes (Signed)
[facility] Group Notes:  (Counselor/Nursing/MHT/Case Management/Adjunct)  03/21/2011 1315  Type of Therapy:  Group Therapy  Participation Level:  Active  Participation Quality:  Appropriate, Attentive and Sharing  Affect:  Appropriate  Cognitive:  Appropriate  Insight:  Good  Engagement in Group:  Good  Engagement in Therapy:  Good  Modes of Intervention:  Activity, Clarification, Problem-solving and Support  Summary of Progress/Problems: Pt attended and participated in group session on supports. Pt was asked to identify a support in their life. Pt stated that she has a difficult time identifying supports in her life. Pt states that she finds some support from her family and from her church. Pt states that often times she isolates instead of reaching out for support.   [name] 03/21/2011, 2:10 PM
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
## 2011-03-21 NOTE — Progress Notes (Signed)
Patient ID: [name], female   DOB: 1965/11/16, 46 y.o.   MRN: [id number] Pt. denies lethality and A/V/H's; Pt. does admit to feeling anxious, but is capable of normal interaction with staff and peers and is cooperative and attentive in group meetings.  Pt. Took her HS meds and went to bed. 22:42-- Pt. awake again and asked for second dose of generic Minipress to help her get to sleep, which was given to her. 23:45--Pt asked for and was given ear plugs to help her sleep. 06:00--Pt. Awake and alert without any problems this am.

## 2011-03-22 MED ORDER — NICOTINE 21 MG/24HR TD PT24: 21.0000 mg | MEDICATED_PATCH | Freq: Every day | TRANSDERMAL | Status: DC

## 2011-03-22 MED ORDER — HYDROXYZINE HCL 25 MG PO TABS: 25.0000 mg | ORAL_TABLET | Freq: Three times a day (TID) | ORAL | Status: DC | PRN

## 2011-03-22 MED ORDER — DIVALPROEX SODIUM ER 500 MG PO TB24
1000.0000 mg | ORAL_TABLET | Freq: Every day | ORAL | Status: DC
  Administered 2011-03-22 – 2011-03-23 (×2): 1000 mg via ORAL
  Filled 2011-03-22: qty 4
  Filled 2011-03-22 (×2): qty 2

## 2011-03-22 MED ORDER — CHLORHEXIDINE GLUCONATE 0.12 % MT SOLN
15.0000 mL | OROMUCOSAL | Status: DC
  Administered 2011-03-22 – 2011-03-24 (×4): 15 mL via OROMUCOSAL
  Filled 2011-03-22 (×8): qty 15

## 2011-03-22 MED ORDER — HYDROXYZINE HCL 50 MG PO TABS
50.0000 mg | ORAL_TABLET | Freq: Every evening | ORAL | Status: DC | PRN
  Administered 2011-03-22: 50 mg via ORAL

## 2011-03-22 MED ORDER — CHLORPROMAZINE HCL 50 MG PO TABS
50.0000 mg | ORAL_TABLET | Freq: Three times a day (TID) | ORAL | Status: DC
  Administered 2011-03-22 – 2011-03-24 (×7): 50 mg via ORAL
  Filled 2011-03-22: qty 1
  Filled 2011-03-22: qty 6
  Filled 2011-03-22 (×6): qty 1
  Filled 2011-03-22 (×2): qty 6
  Filled 2011-03-22 (×3): qty 1

## 2011-03-22 MED ORDER — TRAZODONE HCL 100 MG PO TABS
100.0000 mg | ORAL_TABLET | Freq: Every day | ORAL | Status: DC
  Administered 2011-03-22: 100 mg via ORAL
  Filled 2011-03-22 (×2): qty 1

## 2011-03-22 MED ORDER — HYDROXYZINE PAMOATE 25 MG PO CAPS: 50.0000 mg | ORAL_CAPSULE | Freq: Every evening | ORAL | Status: DC | PRN

## 2011-03-22 NOTE — Progress Notes (Signed)
[facility] Group Notes: (Counselor/Nursing/MHT/Case Management/Adjunct) 03/22/2011   @1 :15pm  Type of Therapy:  Group Therapy  Participation Level:  Active  Participation Quality:  Attentive, Appropriate, Sharing  Affect: Appropriate    Cognitive:  Appropriate  Insight:  Limited  Engagement in Group: Good   Engagement in Therapy:  Good  Modes of Intervention:  Support and Exploration  Summary of Progress/Problems: [name] processed her relationship with her daughter, and how it contributes negatively to her emotional wellbeing. She explored how her daughter expects her always to bail her out, and how she feels responsible to do so now that her granddaughter is involved. [name] stated that she always taught her children how to deal with emotions, but never did so herself. She indicated that she put so much into raising them that she did not take time for herself and now that they are grown she is learning how she did not cultivate her own wellness. [name] described how she has sought support from her landlord in standing up and setting a boundary with her daughter, and explored how she thinks that boundary will impact various dimensions of her wellness.   Billie Lade 03/22/2011 2:39 PM

## 2011-03-22 NOTE — Progress Notes (Addendum)
On patient's self inventory sheet, has poor sleep, poor appetite, low energy level, poor attention span.   Rated depression and hopelessness #10.  Denied withdrawals.  Denied SI.  Feels tired, sluggish this morning.  Zero pain goal.  Worst back pain #6.  Not sure how to take better care of herself after discharge.  No questions for staff.   Does have discharge plans.  No problems taking meds after discharge.  Stated she may go to [place] after discharge. Strongly encouraged patient to get out of bed this morning and take her meds at window.  Pt received nicotine patch this morning, does not need another nicotine patch.

## 2011-03-22 NOTE — Progress Notes (Signed)
[facility] Group Notes:  (Counselor/Nursing/MHT/Case Management/Adjunct)  03/22/2011 12:09 PM  Type of Therapy:  Group Therapy  Participation Level:  Active  Participation Quality:  Drowsy  Affect:  Appropriate  Cognitive:  Appropriate  Insight:  Limited  Engagement in Group:  Limited  Engagement in Therapy:  Limited  Modes of Intervention:  Support  Summary of Progress/Problems:[name] participated in group once, to apologize for a comment she feared may have offended another group member. She also elaborated where she was coming from when she made the comment, and listened attentively while others participated.  Mendleson, Kerriann Kamphuis 03/22/2011, 12:09 PM

## 2011-03-22 NOTE — Tx Team (Addendum)
Interdisciplinary Treatment Plan Update (Adult)  Date:  03/22/2011  Time Reviewed:  9:31 AM   Progress in Treatment: Attending groups:   Yes   Participating in groups:  Yes, open in groups Taking medication as prescribed:  Yes Tolerating medication:  Yes Family/Significant othe contact made: Yes, contact made with daughter, Janine Limbo  Patient understands diagnosis:  Yes Discussing patient identified problems/goals with staff: Yes Medical problems stabilized or resolved: Yes Denies suicidal/homicidal ideation:Yes Issues/concerns per patient self-inventory: None  Other: Suicide prevention education done  New problem(s) identified:  Reason for Continuation of Hospitalization: Anxiety Depression Medication stabilization  Interventions implemented related to continuation of hospitalization:  Medication Management; safety checks q 15 mins  Additional comments:  Estimated length of stay: 2-3 days  Discharge Plan: Discharge home, folllow up with [facility]  New goal(s):  Review of initial/current patient goals per problem list:   1.  Goal(s): Decrease depressive symptoms  Met:  No  Target date: by discharge  As evidenced by: Maxine Glenn will rate depression at 4 or less; rates at 6 today  2.  Goal (s): Reduce potential for self-harm   Met:  Yes  Target date: by discharge  As evidenced by: Maxine Glenn will report no suicidal thoughts, reports no SI today  3.  Goal(s): Decrease anxiety symptoms  Met:  Yes  Target date: by discharge  As evidenced by: Maxine Glenn will rate anxiety at lower than 5; rates at 4 today    Attendees: Patient:  [name] 03/22/2011  10:56 AM  Family:     Physician:  Orson Aloe, MD 03/22/2011 9:31 AM   Nursing:   Quintella Reichert, RN 03/22/2011 9:31 AM   CaseManager:  Juline Patch, LCSW 03/22/2011 9:31 AM   Counselor:  Angus Palms, LCSW 03/22/2011 9:31 AM   Other:  Consuello Bossier, NP 03/22/2011 9:31 AM   Other:  Reyes Ivan, LCSWA 03/22/2011  9:31 AM    Other:  Nanine Means, RN 03/22/2011  10:31 AM  Other:  Charlyne Mom, doctoral psych intern 03/22/2011  10:32 AM   Scribe for Treatment Team:   Wynn Banker, LCSW,  03/22/2011 9:31 AM

## 2011-03-22 NOTE — Progress Notes (Addendum)
Patient seen during discharge planning group.  She continues to endorse high anxiety rating it at eight and depression at six.  She denies SI/HI.  She advised that MD started a new medication on Friday that work well for a day but does not seem to be effective at this time.  Suicide prevention reviewed during group.  MD to make adjustments to medications for further stabilization

## 2011-03-22 NOTE — Progress Notes (Signed)
[facility] MD Progress Note  03/22/2011 10:24 AM  "I could not sleep last night. The vistaril did not help me sleep sleep. I feel restless this morning because I could not sleep. I was on Depakote 1500 mg about a year ago. But I am currently taking only 500 mg.  Thorazine may help if the dose is adjusted some"  Diagnosis:   Axis I: Bipolar, Depressed Axis II: Deferred Axis III:  Past Medical History  Diagnosis Date  . CAD (coronary artery disease)     a. NSTEMI 8/12 tx with DES to [facility] and DES to pRCA;  Cardiac cath 09/29/10: oLAD 25%, m-dLAD 25-30%, mLAD 40%, D1 30%, mCFX 99%, pOM1 25%, pRCA 80% and 70%, oPDA 25%, EF 60%;  echo 8/12: EF 55-60%, mod LVH  . Hypertension   . Bipolar disorder   . GERD (gastroesophageal reflux disease)   . Dyslipidemia   . Tobacco abuse   . History of alcohol abuse   . Myocardial infarction     Sep 28, 2010   Axis IV: No changes Axis V: 51-60 moderate symptoms  ADL's:  Intact  Sleep: Poor  Appetite:  Fair  Suicidal Ideation:  Plan:  No Intent:  No Means:  No Homicidal Ideation:  Plan:  No Intent:  No Means:  No  AEB (as evidenced by):  Mental Status Examination/Evaluation: Objective:  Appearance: Casual  Eye Contact::  Good  Speech:  Clear and Coherent  Volume:  Normal  Mood:  Anxious, restless  Affect:  Flat  Thought Process:  Coherent  Orientation:  Full  Thought Content:  Rumination  Suicidal Thoughts:  No  Homicidal Thoughts:  No  Memory:  Immediate;   Good Recent;   Good Remote;   Good  Judgement:  Fair  Insight:  Good  Psychomotor Activity:  Restlessness due to poor sleep  Concentration:  Fair  Recall:  Good  Akathisia:  No  Handed:  Right  AIMS (if indicated):     Assets:  Desire for Improvement  Sleep:  Number of Hours: 5.5    Vital Signs:Blood pressure 114/74, pulse 77, temperature 97 F (36.1 C), temperature source Oral, resp. rate 16, height 5' 0.5" (1.537 m), weight 69.4 kg (153 lb), last menstrual period  02/20/2011. Current Medications: Current Facility-Administered Medications  Medication Dose Route Frequency Provider Last Rate Last Dose  . acetaminophen (TYLENOL) tablet 650 mg  650 mg Oral Q6H PRN Viviann Spare, NP      . alum & mag hydroxide-simeth (MAALOX/MYLANTA) 200-200-20 MG/5ML suspension 30 mL  30 mL Oral Q4H PRN Viviann Spare, NP      . aspirin EC tablet 81 mg  81 mg Oral Daily Viviann Spare, NP   81 mg at 03/22/11 0811  . chlorproMAZINE (THORAZINE) tablet 25 mg  25 mg Oral TID Orson Aloe, MD   25 mg at 03/22/11 0454  . divalproex (DEPAKOTE ER) 24 hr tablet 1,000 mg  1,000 mg Oral Daily Sanjuana Kava, NP      . escitalopram (LEXAPRO) tablet 20 mg  20 mg Oral Daily Orson Aloe, MD   20 mg at 03/22/11 0981  . hydrOXYzine (VISTARIL) capsule 50 mg  50 mg Oral QHS PRN Sanjuana Kava, NP      . ibuprofen (ADVIL,MOTRIN) tablet 600 mg  600 mg Oral Q8H PRN Viviann Spare, NP   600 mg at 03/22/11 0814  . magnesium hydroxide (MILK OF MAGNESIA) suspension 30 mL  30 mL Oral Daily  PRN Viviann Spare, NP      . metoprolol tartrate (LOPRESSOR) tablet 75 mg  75 mg Oral BID Viviann Spare, NP   75 mg at 03/22/11 0810  . nicotine (NICODERM CQ - dosed in mg/24 hours) patch 14 mg  14 mg Transdermal Daily Orson Aloe, MD   14 mg at 03/22/11 0809  . prasugrel (EFFIENT) tablet 10 mg  10 mg Oral Daily Viviann Spare, NP   10 mg at 03/22/11 0810  . prazosin (MINIPRESS) capsule 1 mg  1 mg Oral QHS,MR X 1 Orson Aloe, MD   1 mg at 03/21/11 2238  . simvastatin (ZOCOR) tablet 40 mg  40 mg Oral QHS Viviann Spare, NP   40 mg at 03/21/11 2135  . traZODone (DESYREL) tablet 100 mg  100 mg Oral QHS Sanjuana Kava, NP      . DISCONTD: divalproex (DEPAKOTE ER) 24 hr tablet 500 mg  500 mg Oral Daily Orson Aloe, MD   500 mg at 03/21/11 2136  . DISCONTD: hydrOXYzine (VISTARIL) capsule 25 mg  25 mg Oral QHS PRN Viviann Spare, NP   25 mg at 03/22/11 0016    Lab Results: No results found for this or  any previous visit (from the past 48 hour(s)).  Physical Findings: AIMS:  , ,  ,  ,    CIWA:    COWS:     Treatment Plan Summary: Daily contact with patient to assess and evaluate symptoms and progress in treatment Medication management  Plan: Increase Vistaril to 50 mg for anxiety and restlessness,           Increase Depakote to 1000 mg  Bedtime.           Add Trazodone 100 mg at bedtime for insomnia.          Armandina Stammer I 03/22/2011, 10:24 AM

## 2011-03-23 DIAGNOSIS — F3130 Bipolar disorder, current episode depressed, mild or moderate severity, unspecified: Principal | ICD-10-CM

## 2011-03-23 MED ORDER — NICOTINE 14 MG/24HR TD PT24
14.0000 mg | MEDICATED_PATCH | Freq: Every day | TRANSDERMAL | Status: DC
  Administered 2011-03-23: 14 mg via TRANSDERMAL
  Filled 2011-03-23 (×3): qty 1

## 2011-03-23 MED ORDER — SALINE SPRAY 0.65 % NA SOLN
1.0000 | NASAL | Status: DC | PRN
  Administered 2011-03-23 (×2): 1 via NASAL
  Filled 2011-03-23: qty 44

## 2011-03-23 MED ORDER — HYDROXYZINE HCL 50 MG PO TABS
100.0000 mg | ORAL_TABLET | Freq: Every evening | ORAL | Status: DC | PRN
  Administered 2011-03-23: 100 mg via ORAL
  Filled 2011-03-23 (×3): qty 2
  Filled 2011-03-23: qty 8
  Filled 2011-03-23 (×2): qty 2
  Filled 2011-03-23: qty 8

## 2011-03-23 NOTE — Progress Notes (Signed)
[facility] Group Notes: (Counselor/Nursing/MHT/Case Management/Adjunct) 03/23/2011   @  11:00am   Type of Therapy:  Group Therapy  Participation Level:  Active  Participation Quality:  Attentive, Appropriate, Sharing, Supportive  Affect:  Blunted  Cognitive:  Appropriate  Insight:  Good  Engagement in Group: Good  Engagement in Therapy:  Good  Modes of Intervention:  Support and Exploration  Summary of Progress/Problems: Mariama was engaged in group. She reported that she felt relieved when she received the diagnosis of bipolar disorder, because it explained the feelings and behaviors she had been experiencing. Maiko explored how the symptoms had impacted her life before she was diagnosed and pointed out that although she was on medication for bipolar symptoms, she had never realized the role that anxiety played until this hospitalization. She processed how she wants to be a good role model for her children and grandchild, make more responsible decisions, and finish school as a part of her recovery.  Billie Lade 03/23/2011 12:21 PM

## 2011-03-23 NOTE — Progress Notes (Signed)
Grief and Loss Group 03/23/11  Facilitated a grief/loss group 10-11am. Provided psychoeducation on types of loss and normal grief reactions, invited group members to discuss grief/loss and to process ways to engage grieving process and overcome it. Topics pf grief/loss in current group focused on loss of interpersonal relationships and death/dying. The group was active and engaged w/ strong sense of universality.  Pt did not speak in the group, slept in the beginning and was silent throughout.  Matricia Begnaud B MS, LPCA, NCC

## 2011-03-23 NOTE — Progress Notes (Signed)
[facility] MD Progress Note  03/23/2011 8:45 AM  ADL's:  Intact  Sleep: Poor  Appetite:  Poor  Suicidal Ideation:  Denies adamantly any suicidal thoughts. Homicidal Ideation:  Denies adamantly any homicidal thoughts.  Mental Status Examination/Evaluation: Objective:  Appearance: Casual  Eye Contact::  Fair  Speech:  Clear and Coherent  Volume:  Normal  Mood:  2/10 on a scale of 1 is the best and 10 is the worst Anxious: 1/10 on the same scale  Affect:  Congruent  Thought Process:  Coherent  Orientation:  Full  Thought Content:  WDL  Suicidal Thoughts:  No  Homicidal Thoughts:  No  Memory:  Immediate;   Good  Judgement:  Fair  Insight:  Fair  Psychomotor Activity:  Normal  Concentration:  Good  Recall:  Good  Akathisia:  No  Handed:  Right  AIMS (if indicated):     Assets:  Communication Skills Desire for Improvement Housing  Sleep:  Number of Hours: 6    Vital Signs:Blood pressure 86/61, pulse 69, temperature 97.9 F (36.6 C), temperature source Oral, resp. rate 20, height 5' 0.5" (1.537 m), weight 69.4 kg (153 lb), last menstrual period 02/20/2011. Current Medications: Current Facility-Administered Medications  Medication Dose Route Frequency Provider Last Rate Last Dose  . acetaminophen (TYLENOL) tablet 650 mg  650 mg Oral Q6H PRN Viviann Spare, NP      . alum & mag hydroxide-simeth (MAALOX/MYLANTA) 200-200-20 MG/5ML suspension 30 mL  30 mL Oral Q4H PRN Viviann Spare, NP      . aspirin EC tablet 81 mg  81 mg Oral Daily Viviann Spare, NP   81 mg at 03/23/11 0825  . chlorhexidine (PERIDEX) 0.12 % solution 15 mL  15 mL Mouth/Throat BH-qamhs Orson Aloe, MD   15 mL at 03/23/11 0825  . chlorproMAZINE (THORAZINE) tablet 50 mg  50 mg Oral TID Orson Aloe, MD   50 mg at 03/23/11 0824  . divalproex (DEPAKOTE ER) 24 hr tablet 1,000 mg  1,000 mg Oral Daily Sanjuana Kava, NP   1,000 mg at 03/22/11 2145  . escitalopram (LEXAPRO) tablet 20 mg  20 mg Oral Daily Orson Aloe, MD   20 mg at 03/23/11 4098  . hydrOXYzine (ATARAX/VISTARIL) tablet 100 mg  100 mg Oral QHS,MR X 1 Orson Aloe, MD      . ibuprofen (ADVIL,MOTRIN) tablet 600 mg  600 mg Oral Q8H PRN Viviann Spare, NP   600 mg at 03/22/11 0814  . magnesium hydroxide (MILK OF MAGNESIA) suspension 30 mL  30 mL Oral Daily PRN Viviann Spare, NP      . metoprolol tartrate (LOPRESSOR) tablet 75 mg  75 mg Oral BID Viviann Spare, NP   75 mg at 03/22/11 1729  . nicotine (NICODERM CQ - dosed in mg/24 hours) patch 14 mg  14 mg Transdermal Daily Orson Aloe, MD   14 mg at 03/23/11 1191  . prasugrel (EFFIENT) tablet 10 mg  10 mg Oral Daily Viviann Spare, NP   10 mg at 03/23/11 0824  . simvastatin (ZOCOR) tablet 40 mg  40 mg Oral QHS Viviann Spare, NP   40 mg at 03/22/11 2145  . DISCONTD: chlorproMAZINE (THORAZINE) tablet 25 mg  25 mg Oral TID Orson Aloe, MD   25 mg at 03/22/11 4782  . DISCONTD: divalproex (DEPAKOTE ER) 24 hr tablet 500 mg  500 mg Oral Daily Orson Aloe, MD   500 mg at 03/21/11 2136  .  DISCONTD: hydrOXYzine (ATARAX/VISTARIL) tablet 25 mg  25 mg Oral TID PRN Sanjuana Kava, NP      . DISCONTD: hydrOXYzine (ATARAX/VISTARIL) tablet 50 mg  50 mg Oral QHS PRN Sanjuana Kava, NP   50 mg at 03/22/11 2334  . DISCONTD: hydrOXYzine (VISTARIL) capsule 25 mg  25 mg Oral QHS PRN Viviann Spare, NP   25 mg at 03/22/11 0016  . DISCONTD: hydrOXYzine (VISTARIL) capsule 50 mg  50 mg Oral QHS PRN Sanjuana Kava, NP      . DISCONTD: nicotine (NICODERM CQ - dosed in mg/24 hours) patch 14 mg  14 mg Transdermal Daily Orson Aloe, MD   14 mg at 03/22/11 0809  . DISCONTD: nicotine (NICODERM CQ - dosed in mg/24 hours) patch 21 mg  21 mg Transdermal Q0600 Orson Aloe, MD      . DISCONTD: prazosin (MINIPRESS) capsule 1 mg  1 mg Oral QHS,MR X 1 Orson Aloe, MD   1 mg at 03/22/11 2147  . DISCONTD: traZODone (DESYREL) tablet 100 mg  100 mg Oral QHS Sanjuana Kava, NP   100 mg at 03/22/11 2145    Lab Results: No  results found for this or any previous visit (from the past 48 hour(s)).  Physical Findings: AIMS:  , ,  ,  ,    CIWA:    COWS:     Treatment Plan Summary: Daily contact with patient to assess and evaluate symptoms and progress in treatment Medication management  Plan: Stop Minipress and Trazodone which are not effective for sedation.  Vistaril is effective, so will push that so she can get to sleep easily tonight and then plan for D/C tomorrow.  [name] 03/23/2011, 8:45 AM

## 2011-03-23 NOTE — Progress Notes (Signed)
Patient in bed during thisassessment. She reported shoulder pain of 5 on a scale of 1-10 with 10 the worst. Writer offered Tylenol or Ibuprofen and heat pad. Pt received the Ibuprofen and heat pad. She denied SI/HI and denied hallucinations. Q 15 minutes check continues to maintain safety.

## 2011-03-23 NOTE — Progress Notes (Signed)
Patient ID: [name], female   DOB: [birth year], 47 y.o.   MRN: [id number]  Pt was evasive and only told the writer that she had a "fine" day. Pt had no questions or concerns. Pt openly discussed her classes with the writer, but didn't engage when discussing her care. Support and encouragement was offered.

## 2011-03-23 NOTE — Progress Notes (Signed)
Patient up and attending groups today.  Metoprolol held this morning for low blood pressure.  Has been pleasant and cooperative.  Stated she had a small amount of blood on her tissue this morning and wondered if it was from her blood pressure being low.  Advised that was not likely, but that she was having some dryness because of the dry air in here.  Order obtained for nasal saline spray.

## 2011-03-24 MED ORDER — ASPIRIN EC 81 MG PO TBEC: 81.0000 mg | DELAYED_RELEASE_TABLET | Freq: Every day | ORAL | Status: DC

## 2011-03-24 MED ORDER — CHLORPROMAZINE HCL 50 MG PO TABS: 50.0000 mg | ORAL_TABLET | Freq: Three times a day (TID) | ORAL | Status: DC

## 2011-03-24 MED ORDER — POTASSIUM CHLORIDE CRYS ER 20 MEQ PO TBCR
20.0000 meq | EXTENDED_RELEASE_TABLET | Freq: Two times a day (BID) | ORAL | Status: DC
  Administered 2011-03-24: 20 meq via ORAL
  Filled 2011-03-24 (×4): qty 1
  Filled 2011-03-24: qty 3

## 2011-03-24 MED ORDER — NICOTINE 7 MG/24HR TD PT24
7.0000 mg | MEDICATED_PATCH | Freq: Every day | TRANSDERMAL | Status: DC
  Administered 2011-03-24: 7 mg via TRANSDERMAL
  Filled 2011-03-24: qty 1

## 2011-03-24 MED ORDER — HYDROXYZINE HCL 50 MG PO TABS: 100.0000 mg | ORAL_TABLET | Freq: Every evening | ORAL | Status: AC | PRN

## 2011-03-24 MED ORDER — PRASUGREL HCL 10 MG PO TABS: 10.0000 mg | ORAL_TABLET | Freq: Every day | ORAL | Status: DC

## 2011-03-24 MED ORDER — POTASSIUM CHLORIDE CRYS ER 20 MEQ PO TBCR: 20.0000 meq | EXTENDED_RELEASE_TABLET | Freq: Two times a day (BID) | ORAL | Status: DC

## 2011-03-24 MED ORDER — METOPROLOL TARTRATE 25 MG PO TABS: 75.0000 mg | ORAL_TABLET | Freq: Two times a day (BID) | ORAL | Status: DC

## 2011-03-24 MED ORDER — SIMVASTATIN 40 MG PO TABS: 40.0000 mg | ORAL_TABLET | Freq: Every day | ORAL | Status: DC

## 2011-03-24 MED ORDER — DIVALPROEX SODIUM ER 500 MG PO TB24: 1000.0000 mg | ORAL_TABLET | Freq: Every day | ORAL | Status: DC

## 2011-03-24 MED ORDER — ESCITALOPRAM OXALATE 20 MG PO TABS: 20.0000 mg | ORAL_TABLET | Freq: Every day | ORAL | Status: DC

## 2011-03-24 NOTE — Progress Notes (Signed)
DISCHARGE LATE NOTE: Clinical research associate reviewed discharge instructions with patient. Belongings returned, she denied SI?HI and denied hallucinations. She requested for a bus pass because her daughter didn't pick up her calls. Pt given bus pass. Pt discharged home as ordered by physician. Her daughter later came to the [facility] to pick her up.

## 2011-03-24 NOTE — Tx Team (Signed)
Interdisciplinary Treatment Plan Update (Adult)  Date:  03/24/2011  Time Reviewed:  10:35 AM   Progress in Treatment: Attending groups:   Yes   Participating in groups:  Yes Taking medication as prescribed:  Yes Tolerating medication:  Yes Family/Significant othe contact made: Contact made with daughter Patient understands diagnosis:  Yes Discussing patient identified problems/goals with staff: Yes Medical problems stabilized or resolved: Yes Denies suicidal/homicidal ideation:Yes Issues/concerns per patient self-inventory:  None identified Other:  New problem(s) identified:  Reason for Continuation of Hospitalization:  Interventions implemented related to continuation of hospitalization:  Additional comments:  Estimated length of stay:  Discharge home today  Discharge Plan:  Home with family  New goal(s):  Review of initial/current patient goals per problem list:   1.  Goal(s):  Decrease depression  Met:  Yes  Target date: d/c  As evidenced by:  Patient rated depression at ten on admission rated at zero today  2.  Goal (s):  Decrease Anxiety  Met:  Yes  Target date: d/c  As evidenced by:  Maxine Glenn rated depression at ten on admission; rated at zero today  3.  Goal(s):  Stabilize on medication  Met:  Yes  Target date:  d/c  As evidenced by:  Maxine Glenn reports medications are working - symptoms decreased  4.  Goal(s):  Schedule outpatient follow up  Met:  Yes  Target date:  d/c  As evidenced by:  Follow up scheduled with Vesta Mixer  Attendees: Patient:     Family:     Physician:  Orson Aloe, MD 03/24/2011 10:35 AM   Nursing:    03/24/2011 10:35 AM   CaseManager:  Juline Patch, LCSW 03/24/2011 10:35 AM   Counselor:  Angus Palms, LCSW 03/24/2011 10:35 AM   Other:  Consuello Bossier, NP 03/24/2011 10:35 AM   Other:  Reyes Ivan, LCSWA 03/24/2011  10:35 AM   Other:     Other:      Scribe for Treatment Team:   Wynn Banker, LCSW,  03/24/2011  10:35 AM

## 2011-03-24 NOTE — [facility] Suicide Risk Assessment (Signed)
Suicide Risk Assessment  Discharge Assessment     Demographic factors:  Assessment Details Time of Assessment: Admission Information Obtained From: Patient Current Mental Status:  Current Mental Status: Self-harm thoughts Risk Reduction Factors:  Risk Reduction Factors: Responsible for children under 45 years of age;Sense of responsibility to family;Living with another person, especially a relative;Positive social support  CLINICAL FACTORS:   Severe Anxiety and/or Agitation Bipolar Disorder:   Bipolar II Previous Psychiatric Diagnoses and Treatments Medical Diagnoses and Treatments/Surgeries  COGNITIVE FEATURES THAT CONTRIBUTE TO RISK:  Thought constriction (tunnel vision)    SUICIDE RISK:   Minimal: No identifiable suicidal ideation.  Patients presenting with no risk factors but with morbid ruminations; may be classified as minimal risk based on the severity of the depressive symptoms  ADL's:  Intact  Sleep: Good  Appetite:  Good  Suicidal Ideation:  Denies adamantly any suicidal thoughts. Homicidal Ideation:  Denies adamantly any homicidal thoughts.  Mental Status Examination/Evaluation: Objective:  Appearance: Casual  Eye Contact::  Good  Speech:  Clear and Coherent  Volume:  Normal  Mood:  Euthymic  Affect:  Congruent  Thought Process:  Coherent  Orientation:  Full  Thought Content:  WDL  Suicidal Thoughts:  No  Homicidal Thoughts:  No  Memory:  Immediate;   Good  Judgement:  Good  Insight:  Good  Psychomotor Activity:  Normal  Concentration:  Good  Recall:  Good  Akathisia:  No  AIMS (if indicated):     Assets:  Communication Skills Desire for Improvement Financial Resources/Insurance Housing Leisure Time Resilience Transportation  Sleep:       Vital Signs: Blood pressure 115/75, pulse 80, temperature 97.9 F (36.6 C), temperature source Oral, resp. rate 18, height 5' 0.5" (1.537 m), weight 69.4 kg (153 lb), last menstrual period  02/20/2011.  Labs No results found for this or any previous visit (from the past 48 hour(s)).  What pt has learned from hospital stay is that she counts and that she is important.  She had put everyone before herself and now she needs to take charge of her life.   Risk of self harm is elevated by her diagnosis of bipolar disorder, but she has decided to live for herself, her grand daughter and her future.  Risk of harm to others is minimal in that she has not been involved in fights or had any legal charges filed on her.  PLAN: Discharge home Continue Medication List  As of 03/24/2011  1:37 PM   TAKE these medications         aspirin EC 81 MG tablet   Take 1 tablet (81 mg total) by mouth daily. For prevention of heart attacks and strokes.      chlorproMAZINE 50 MG tablet   Commonly known as: THORAZINE   Take 1 tablet (50 mg total) by mouth 3 (three) times daily. For anxiety      divalproex 500 MG 24 hr tablet   Commonly known as: DEPAKOTE ER   Take 2 tablets (1,000 mg total) by mouth daily. For mood control      escitalopram 20 MG tablet   Commonly known as: LEXAPRO   Take 1 tablet (20 mg total) by mouth daily. For depresion.      hydrOXYzine 50 MG tablet   Commonly known as: ATARAX/VISTARIL   Take 2 tablets (100 mg total) by mouth at bedtime and may repeat dose one time if needed. For insomnia.      metoprolol tartrate 25 MG  tablet   Commonly known as: LOPRESSOR   Take 3 tablets (75 mg total) by mouth 2 (two) times daily. For control of high blood pressure      potassium chloride SA 20 MEQ tablet   Commonly known as: K-DUR,KLOR-CON   Take 1 tablet (20 mEq total) by mouth 2 (two) times daily. For potassium replacement.      prasugrel 10 MG Tabs   Commonly known as: EFFIENT   Take 1 tablet (10 mg total) by mouth daily. For acute coronary syndrome      simvastatin 40 MG tablet   Commonly known as: ZOCOR   Take 1 tablet (40 mg total) by mouth at bedtime. To help lower  cholesterol.           Kaleb Sek 03/24/2011, 12:13 PM

## 2011-03-24 NOTE — Discharge Summary (Signed)
Physician Discharge Summary Note  Patient:  [name] is an 46 y.o., female MRN:  [id number] DOB:  [birth year] Patient phone:  [phone] (home)  Patient address:   [address] [place]  [address],   Date of Admission:  03/18/2011 Date of Discharge: 03/24/11  Reason for Admission: Suicidal ideations.  Discharge Diagnoses: Principal Problem:  *Bipolar disorder, now depressed   Axis Diagnosis:   AXIS I:  Bipolar, Depressed AXIS II:  Deferred AXIS III:   Past Medical History  Diagnosis Date  . CAD (coronary artery disease)     a. NSTEMI 8/12 tx with DES to [facility] and DES to pRCA;  Cardiac cath 09/29/10: oLAD 25%, m-dLAD 25-30%, mLAD 40%, D1 30%, mCFX 99%, pOM1 25%, pRCA 80% and 70%, oPDA 25%, EF 60%;  echo 8/12: EF 55-60%, mod LVH  . Hypertension   . Bipolar disorder   . GERD (gastroesophageal reflux disease)   . Dyslipidemia   . Tobacco abuse   . History of alcohol abuse   . 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 Feels she's been in a significantly depressed mood for the past 2-3 months. For the past 2-3 days she's had thoughts of walking in front of a car, possibly cutting her wrists, and reviewing other options for suicide. She complains of decreased sleep for 2 hours per night, increased impulsivity, increased irritability of mood, and decreased concentration. While a patient in this hospital, Ms. Meeker received medication management as well as group counseling. She also received medication management and monitoring for other medical conditions. Patient on daily basis reports improved mood and decreased suicidal ideations. She agreed with the treatment team members this morning that she is stable for home discharge. She  states as to what she learned from being in this hospital: Put her self first before others for a change. She now believes that she does matter.  Take her medications as prescribed to continue to feel better. And finally has learn to like her given name '[name]". She will continue psychiatric care on an outpatient basis. She will follow-up care at [facility] with Dr. Duncan Dull on 03/30/11. The address, date and time of her appointment provided for patient. Patient left [facility] facility with all personal belongings vis family transport. She is in no apparent distress.  Consults:  None  Significant Diagnostic Studies:  None  Discharge Vitals:   Blood pressure 115/75, pulse 80, temperature 97.9 F (36.6 C), temperature source Oral, resp. rate 18, height 5' 0.5" (1.537 m), weight 69.4 kg (153 lb), last menstrual period 02/20/2011.  Mental Status Exam: See Mental Status Examination and Suicide Risk Assessment completed by Attending Physician prior to discharge.  Discharge destination:  Home  Is patient on multiple antipsychotic therapies at discharge:  No   Has Patient had three or more failed trials of antipsychotic monotherapy by history:  No  Recommended Plan for Multiple Antipsychotic Therapies: NA  Discharge Orders    Future Appointments: Provider: Department: Dept Phone: Center:   04/07/2011 11:45 AM Valera Castle, MD [facility] [phone] [id number]     Medication List  As of 03/24/2011  1:55 PM   TAKE these medications      Indication    aspirin EC 81 MG tablet  Take 1 tablet (81 mg total) by mouth daily. For prevention of heart attacks and strokes.       chlorproMAZINE 50 MG tablet   Commonly known as: THORAZINE   Take 1 tablet (50 mg total) by mouth 3 (three) times daily. For anxiety       divalproex 500 MG 24 hr tablet   Commonly known as: DEPAKOTE ER   Take 2 tablets (1,000 mg total) by mouth daily. For mood control       escitalopram 20 MG tablet   Commonly known as:  LEXAPRO   Take 1 tablet (20 mg total) by mouth daily. For depresion.       hydrOXYzine 50 MG tablet   Commonly known as: ATARAX/VISTARIL   Take 2 tablets (100 mg total) by mouth at bedtime and may repeat dose one time if needed. For insomnia.       metoprolol tartrate 25 MG tablet   Commonly known as: LOPRESSOR   Take 3 tablets (75 mg total) by mouth 2 (two) times daily. For control of high blood pressure       potassium chloride SA 20 MEQ tablet   Commonly known as: K-DUR,KLOR-CON   Take 1 tablet (20 mEq total) by mouth 2 (two) times daily. For potassium replacement.       prasugrel 10 MG Tabs   Commonly known as: EFFIENT   Take 1 tablet (10 mg total) by mouth daily. For acute coronary syndrome       simvastatin 40 MG tablet   Commonly known as: ZOCOR   Take 1 tablet (40 mg total) by mouth at bedtime. To help lower cholesterol.            Follow-up Information    Follow up with Dr. Garald Balding on 03/30/2011. (You are scheduled to see Dr. Aundra Millet on Tuesday, March 30, 2011)    Contact information:   [address]  [place], [place]  [address]  [phone]         Follow-up recommendations:  Other:  Keep all scheduled follow-up appointments as recommended.  Comments:  Take all medications as prescribed.  SignedArmandina Stammer I 03/24/2011, 1:55 PM

## 2011-03-24 NOTE — Progress Notes (Addendum)
[facility] Case Management Discharge Plan:  Will you be returning to the same living situation after discharge: Yes,  [name] is return to the home she shares with family At discharge, do you have transportation home?:Yes,  Has family who will transport her home Do you have the ability to pay for your medications:Yes,  [name] has Medicaid  Interagency Information:     Release of information consent forms completed and in the chart;  Patient's signature needed at discharge.  Patient to Follow up at:  Follow-up Information    Follow up with Dr. Garald Balding on 03/30/2011. (You are scheduled to see Dr. Aundra Millet on Tuesday, March 30, 2011)    Contact information:   [address]  [place], [place]  [address]  [phone]         Patient denies SI/HI:   Yes,  [name] has consistently denied SI    Safety Planning and Suicide Prevention discussed:  Yes,  Reviewed during discharge planning gorup  Barrier to discharge identified:  No barriers identified.  Summary and Recommendations:  Continue follow up with [facility] 03/24/2011, 10:43 AM

## 2011-03-24 NOTE — Discharge Summary (Signed)
I agree with this D/C Summary.  

## 2011-03-24 NOTE — Progress Notes (Signed)
[facility] Group Notes:  (Counselor/Nursing/MHT/Case Management/Adjunct)  03/24/2011 3:25 PM  Type of Therapy:  1:15PM Group Therapy  Participation Level:  None  Participation Quality:  Attentive  Affect:  Appropriate  Cognitive:  Alert and Appropriate  Insight:  None  Engagement in Group:  None  Engagement in Therapy:  None  Modes of Intervention:  Activity, Education and Exploration  Summary of Progress/Problems: Patient appeared attentive and fully coherent, but did not verbally participate in group discussion.    Wilmon Arms 03/24/2011, 3:25 PM  Cosigned by: Angus Palms, LCSW

## 2011-03-24 NOTE — Progress Notes (Signed)
Patient ID: [name], female   DOB: August 21, 1965, 46 y.o.   MRN: [id number]  Patient was pleasant and cooperative during the assessment. 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 Stated that until now she didn't connect abuse with depression. This time pt states she's taking treatment more serious. Support and encouragement was offered.

## 2011-03-24 NOTE — Progress Notes (Signed)
[facility] Group Notes:  (Counselor/Nursing/MHT/Case Management/Adjunct)  03/24/2011 11:59 AM  Type of Therapy:  Group Therapy  Participation Level:  None  Participation Quality:  Drowsy  Affect:  Appropriate  Cognitive:  Appropriate  Insight:  None  Engagement in Group:  None  Engagement in Therapy:  None  Modes of Intervention:  Support  Summary of Progress/Problems: Client slept for much of groups, but at times opened her eyes and appeared attentive.   [place]on, Audrionna Lampton 03/24/2011, 11:59 AM

## 2011-03-26 NOTE — Progress Notes (Signed)
Patient Discharge Instructions:  Admission Note Faxed,  03/26/2011 After Visit Summary Faxed,  03/26/2011 Faxed to the [facility] Care provider:  03/26/2011 D/C Summary Note faxed 03/26/2011 Facesheet faxed 03/26/2011  Faxed to [facility] - Dr. Duncan Dull @ [phone]  Wandra Scot, 03/26/2011, 7:01 PM

## 2011-04-06 ENCOUNTER — Emergency Department (HOSPITAL_COMMUNITY): Payer: Medicaid Other

## 2011-04-06 ENCOUNTER — Emergency Department (HOSPITAL_COMMUNITY)
Admission: EM | Admit: 2011-04-06 | Discharge: 2011-04-07 | Discharge status: Against Medical Advice | Payer: Medicaid Other | Attending: Emergency Medicine | Admitting: Emergency Medicine

## 2011-04-06 ENCOUNTER — Emergency Department (INDEPENDENT_AMBULATORY_CARE_PROVIDER_SITE_OTHER)
Admission: EM | Admit: 2011-04-06 | Discharge: 2011-04-06 | Disposition: A | Discharge status: Nursing Home (Includes ICF) | Payer: Medicaid Other | Source: Home / Self Care | Attending: Family Medicine | Admitting: Family Medicine

## 2011-04-06 ENCOUNTER — Encounter (HOSPITAL_COMMUNITY): Payer: Self-pay | Admitting: Emergency Medicine

## 2011-04-06 ENCOUNTER — Other Ambulatory Visit: Payer: Self-pay

## 2011-04-06 ENCOUNTER — Encounter (HOSPITAL_COMMUNITY): Payer: Self-pay | Admitting: *Deleted

## 2011-04-06 DIAGNOSIS — R4182 Altered mental status, unspecified: Secondary | ICD-10-CM | POA: Insufficient documentation

## 2011-04-06 DIAGNOSIS — R404 Transient alteration of awareness: Secondary | ICD-10-CM

## 2011-04-06 DIAGNOSIS — F17200 Nicotine dependence, unspecified, uncomplicated: Secondary | ICD-10-CM | POA: Insufficient documentation

## 2011-04-06 DIAGNOSIS — R42 Dizziness and giddiness: Secondary | ICD-10-CM | POA: Insufficient documentation

## 2011-04-06 DIAGNOSIS — F319 Bipolar disorder, unspecified: Secondary | ICD-10-CM | POA: Insufficient documentation

## 2011-04-06 DIAGNOSIS — E7220 Disorder of urea cycle metabolism, unspecified: Secondary | ICD-10-CM | POA: Insufficient documentation

## 2011-04-06 DIAGNOSIS — I2510 Atherosclerotic heart disease of native coronary artery without angina pectoris: Secondary | ICD-10-CM | POA: Insufficient documentation

## 2011-04-06 DIAGNOSIS — R410 Disorientation, unspecified: Secondary | ICD-10-CM

## 2011-04-06 DIAGNOSIS — E785 Hyperlipidemia, unspecified: Secondary | ICD-10-CM | POA: Insufficient documentation

## 2011-04-06 DIAGNOSIS — Z7982 Long term (current) use of aspirin: Secondary | ICD-10-CM | POA: Insufficient documentation

## 2011-04-06 DIAGNOSIS — I252 Old myocardial infarction: Secondary | ICD-10-CM | POA: Insufficient documentation

## 2011-04-06 DIAGNOSIS — F1310 Sedative, hypnotic or anxiolytic abuse, uncomplicated: Secondary | ICD-10-CM

## 2011-04-06 DIAGNOSIS — F1021 Alcohol dependence, in remission: Secondary | ICD-10-CM | POA: Insufficient documentation

## 2011-04-06 DIAGNOSIS — I10 Essential (primary) hypertension: Secondary | ICD-10-CM | POA: Insufficient documentation

## 2011-04-06 LAB — RAPID URINE DRUG SCREEN, HOSP PERFORMED
Amphetamines: NOT DETECTED
Barbiturates: NOT DETECTED
Benzodiazepines: NOT DETECTED
Cocaine: NOT DETECTED
Opiates: NOT DETECTED
Tetrahydrocannabinol: NOT DETECTED

## 2011-04-06 LAB — TYPE AND SCREEN
ABO/RH(D): A POS
Antibody Screen: NEGATIVE

## 2011-04-06 LAB — COMPREHENSIVE METABOLIC PANEL
ALT: 37 U/L — ABNORMAL HIGH (ref 0–35)
AST: 28 U/L (ref 0–37)
Albumin: 3.5 g/dL (ref 3.5–5.2)
Alkaline Phosphatase: 126 U/L — ABNORMAL HIGH (ref 39–117)
BUN: 4 mg/dL — ABNORMAL LOW (ref 6–23)
CO2: 22 mEq/L (ref 19–32)
Calcium: 9.4 mg/dL (ref 8.4–10.5)
Chloride: 104 mEq/L (ref 96–112)
Creatinine, Ser: 0.75 mg/dL (ref 0.50–1.10)
GFR calc Af Amer: >90 mL/min (ref >90)
GFR calc non Af Amer: >90 mL/min (ref >90)
Glucose, Bld: 78 mg/dL (ref 70–99)
Potassium: 4.1 mEq/L (ref 3.5–5.1)
Sodium: 137 mEq/L (ref 135–145)
Total Bilirubin: 0.2 mg/dL — ABNORMAL LOW (ref 0.3–1.2)
Total Protein: 7.1 g/dL (ref 6.0–8.3)

## 2011-04-06 LAB — DIFFERENTIAL
Basophils Absolute: 0 10*3/uL (ref 0.0–0.1)
Basophils Relative: 0 % (ref 0–1)
Eosinophils Absolute: 0.2 10*3/uL (ref 0.0–0.7)
Eosinophils Relative: 5 % (ref 0–5)
Lymphocytes Relative: 36 % (ref 12–46)
Lymphs Abs: 1.7 10*3/uL (ref 0.7–4.0)
Monocytes Absolute: 0.4 10*3/uL (ref 0.1–1.0)
Monocytes Relative: 8 % (ref 3–12)
Neutro Abs: 2.4 10*3/uL (ref 1.7–7.7)
Neutrophils Relative %: 51 % (ref 43–77)

## 2011-04-06 LAB — URINE MICROSCOPIC-ADD ON

## 2011-04-06 LAB — URINALYSIS, ROUTINE W REFLEX MICROSCOPIC
Bilirubin Urine: NEGATIVE
Glucose, UA: NEGATIVE mg/dL
Ketones, ur: NEGATIVE mg/dL
Leukocytes, UA: NEGATIVE
Nitrite: NEGATIVE
Protein, ur: NEGATIVE mg/dL
Specific Gravity, Urine: 1.011 (ref 1.005–1.030)
Urobilinogen, UA: 0.2 mg/dL (ref 0.0–1.0)
pH: 6.5 (ref 5.0–8.0)

## 2011-04-06 LAB — CBC
HCT: 43.1 % (ref 36.0–46.0)
Hemoglobin: 13.8 g/dL (ref 12.0–15.0)
MCH: 26.9 pg (ref 26.0–34.0)
MCHC: 32 g/dL (ref 30.0–36.0)
MCV: 84 fL (ref 78.0–100.0)
Platelets: 252 10*3/uL (ref 150–400)
RBC: 5.13 MIL/uL — ABNORMAL HIGH (ref 3.87–5.11)
RDW: 16.9 % — ABNORMAL HIGH (ref 11.5–15.5)
WBC: 4.7 10*3/uL (ref 4.0–10.5)

## 2011-04-06 LAB — GLUCOSE, CAPILLARY: Glucose-Capillary: 104 mg/dL — ABNORMAL HIGH (ref 70–99)

## 2011-04-06 LAB — AMMONIA: Ammonia: 94 umol/L — ABNORMAL HIGH (ref 11–60)

## 2011-04-06 LAB — ETHANOL: Alcohol, Ethyl (B): <11 mg/dL (ref 0–11)

## 2011-04-06 LAB — ABO/RH: ABO/RH(D): A POS

## 2011-04-06 LAB — PROTIME-INR
INR: 1.08 (ref 0.00–1.49)
Prothrombin Time: 14.2 seconds (ref 11.6–15.2)

## 2011-04-06 LAB — APTT: aPTT: 33 seconds (ref 24–37)

## 2011-04-06 MED ORDER — SODIUM CHLORIDE 0.9 % IV SOLN
1000.0000 mL | INTRAVENOUS | Status: DC
  Administered 2011-04-06: 1000 mL via INTRAVENOUS

## 2011-04-06 NOTE — ED Provider Notes (Signed)
History     CSN: [id number]  Arrival date & time 04/06/11  1717   First MD Initiated Contact with Patient 04/06/11 1821      Chief Complaint  Patient presents with  . Weakness    (Consider location/radiation/quality/duration/timing/severity/associated sxs/prior treatment) HPI Comments: Raziah is brought in by her family members namely her son for evaluation of acute delirium. Her son reports a change in her mental status over the last 2 days. He reports that she is more somnolent. She is slurring her words. She seems confused. Upon further interview, she asked her. Her son is removed in the room to further discuss with this provider. She reports that she is taking an unknown amount of Ativan of the last 2 days. She reports anywhere from 4-8 pills per day. She is unsure. She also reports that she is very sleepy and has trouble staying awake during the interview. She denies any chest pain or shortness of breath. She does report her vision. She reports double vision. She also reports that she "took too many Xanax." Last month. She denies any suicidal ideations. When asked why she takes her many pills. She replies, "because they feel good".  Patient is a 46 y.o. female presenting with Overdose. The history is provided by the patient and a relative.  Drug Overdose This is a new problem. The current episode started 2 days ago. The problem has not changed since onset.The symptoms are aggravated by nothing. The symptoms are relieved by nothing.    Past Medical History  Diagnosis Date  . CAD (coronary artery disease)     a. NSTEMI 8/12 tx with DES to [facility] and DES to pRCA;  Cardiac cath 09/29/10: oLAD 25%, m-dLAD 25-30%, mLAD 40%, D1 30%, mCFX 99%, pOM1 25%, pRCA 80% and 70%, oPDA 25%, EF 60%;  echo 8/12: EF 55-60%, mod LVH  . Hypertension   . Bipolar disorder   . GERD (gastroesophageal reflux disease)   . Dyslipidemia   . Tobacco abuse   . History of alcohol abuse   . Myocardial infarction    Sep 28, 2010    Past Surgical History  Procedure Date  . Cardiac catheterization     Family History  Problem Relation Age of Onset  . Hypertension Mother   . Mental illness Mother     History  Substance Use Topics  . Smoking status: Current Everyday Smoker -- 1.0 packs/day    Types: Cigarettes  . Smokeless tobacco: Not on file  . Alcohol Use: No     REMOTE USE    OB History    Grav Para Term Preterm Abortions TAB SAB Ect Mult Living                  Review of Systems  Constitutional: Negative.   HENT: Negative.   Eyes: Negative.   Respiratory: Negative.   Cardiovascular: Negative.   Gastrointestinal: Negative.   Genitourinary: Negative.   Musculoskeletal: Negative.   Skin: Negative.   Neurological: Negative.   Psychiatric/Behavioral: Positive for behavioral problems, confusion and decreased concentration. Negative for suicidal ideas.       Slurred speech, altered mental status    Allergies  Aspirin; Codeine; and Vicodin  Home Medications   Current Outpatient Rx  Name Route Sig Dispense Refill  . ASPIRIN EC 81 MG PO TBEC Oral Take 1 tablet (81 mg total) by mouth daily. For prevention of heart attacks and strokes. 30 tablet 0  . CHLORPROMAZINE HCL 50 MG PO TABS Oral  Take 1 tablet (50 mg total) by mouth 3 (three) times daily. For anxiety 90 tablet 0  . DIVALPROEX SODIUM ER 500 MG PO TB24 Oral Take 2 tablets (1,000 mg total) by mouth daily. For mood control 60 tablet 0  . ESCITALOPRAM OXALATE 20 MG PO TABS Oral Take 1 tablet (20 mg total) by mouth daily. For depresion. 30 tablet 0  . METOPROLOL TARTRATE 25 MG PO TABS Oral Take 3 tablets (75 mg total) by mouth 2 (two) times daily. For control of high blood pressure 180 tablet 0  . POTASSIUM CHLORIDE CRYS ER 20 MEQ PO TBCR Oral Take 1 tablet (20 mEq total) by mouth 2 (two) times daily. For potassium replacement. 4 tablet 0  . PRASUGREL HCL 10 MG PO TABS Oral Take 1 tablet (10 mg total) by mouth daily. For acute  coronary syndrome 30 tablet 0  . SIMVASTATIN 40 MG PO TABS Oral Take 1 tablet (40 mg total) by mouth at bedtime. To help lower cholesterol. 30 tablet 0    BP 168/99  Pulse 73  Temp 98.6 F (37 C)  Resp 16  SpO2 100%  LMP 02/20/2011  Physical Exam  HENT:  Head: Normocephalic and atraumatic.  Eyes: Conjunctivae, EOM and lids are normal. Pupils are equal, round, and reactive to light.  Cardiovascular: Normal rate, regular rhythm and normal heart sounds.   No murmur heard.      ECG: NSR, rate 71, no prolonged QT, inverted T wave in III  Pulmonary/Chest: Effort normal and breath sounds normal. She has no decreased breath sounds. She has no wheezes. She has no rhonchi.    ED Course  Procedures (including critical care time)  Labs Reviewed  GLUCOSE, CAPILLARY - Abnormal; Notable for the following:    Glucose-Capillary 104 (*)    All other components within normal limits   No results found.   1. Acute delirium   2. Benzodiazepine abuse       MDM  Transferred to Emergency Department.         Richardo Priest, MD 04/06/11 2012

## 2011-04-06 NOTE — ED Notes (Signed)
12 lead shows NSR. Pt has 20g in rt hand with NS at kvo.

## 2011-04-06 NOTE — ED Notes (Signed)
Pt  Placed  On nasal o2  At  2 l /  Min  cardiavc  Monitor  And  Ns  tko  Via  20  Angio   Into r  Hand  1  Att

## 2011-04-06 NOTE — ED Notes (Signed)
Pton  furthur     Review  Pt  Has  Been taking  Undetermined  amts  Of  Possibly  Xanax  Or  Ativan  She  denys  Any  Suicidal ideations

## 2011-04-06 NOTE — ED Provider Notes (Signed)
History     CSN: [id number]  Arrival date & time 04/06/11  2056   First MD Initiated Contact with Patient 04/06/11 2110      Chief Complaint  Patient presents with  . Altered Mental Status    (Consider location/radiation/quality/duration/timing/severity/associated sxs/prior treatment) HPI The patient presents from the urgent care for evaluation of slurred speech and confusion. Patient was brought in by her son to the urgent care for a change in her mental status over the last 2 days. Patient had been more somnolent and slurring her words.  The family voiced concerns to the urgent care doctor that she's been taking excess quantities of Ativan possibly. She may been taking anywhere from 4-8 pills per day.  The patient had been very sleepy. Patient states she's been having double vision, trouble with her balance in slurred speech. When questioned by the urgent care she said that she took her anxiety pills because they made her feel better. Patient denies any suicidal or homicidal ideation. She denies any head injury or fevers. Past Medical History  Diagnosis Date  . CAD (coronary artery disease)     a. NSTEMI 8/12 tx with DES to [facility] and DES to pRCA;  Cardiac cath 09/29/10: oLAD 25%, m-dLAD 25-30%, mLAD 40%, D1 30%, mCFX 99%, pOM1 25%, pRCA 80% and 70%, oPDA 25%, EF 60%;  echo 8/12: EF 55-60%, mod LVH  . Hypertension   . Bipolar disorder   . GERD (gastroesophageal reflux disease)   . Dyslipidemia   . Tobacco abuse   . History of alcohol abuse   . Myocardial infarction     Sep 28, 2010    Past Surgical History  Procedure Date  . Cardiac catheterization     Family History  Problem Relation Age of Onset  . Hypertension Mother   . Mental illness Mother     History  Substance Use Topics  . Smoking status: Current Everyday Smoker -- 1.0 packs/day    Types: Cigarettes  . Smokeless tobacco: Not on file  . Alcohol Use: No     REMOTE USE    OB History    Grav Para Term Preterm  Abortions TAB SAB Ect Mult Living                  Review of Systems  All other systems reviewed and are negative.    Allergies  Codeine and Vicodin  Home Medications   Current Outpatient Rx  Name Route Sig Dispense Refill  . ASPIRIN EC 81 MG PO TBEC Oral Take 1 tablet (81 mg total) by mouth daily. For prevention of heart attacks and strokes. 30 tablet 0  . CHLORPROMAZINE HCL 50 MG PO TABS Oral Take 1 tablet (50 mg total) by mouth 3 (three) times daily. For anxiety 90 tablet 0  . DIVALPROEX SODIUM ER 500 MG PO TB24 Oral Take 2 tablets (1,000 mg total) by mouth daily. For mood control 60 tablet 0  . ESCITALOPRAM OXALATE 20 MG PO TABS Oral Take 20 mg by mouth daily.    Marland Kitchen LORAZEPAM 1 MG PO TABS Oral Take 1 mg by mouth 3 (three) times daily.    Marland Kitchen METOPROLOL TARTRATE 25 MG PO TABS Oral Take 3 tablets (75 mg total) by mouth 2 (two) times daily. For control of high blood pressure 180 tablet 0  . POTASSIUM CHLORIDE CRYS ER 20 MEQ PO TBCR Oral Take 1 tablet (20 mEq total) by mouth 2 (two) times daily. For potassium replacement. 4 tablet  0  . PRASUGREL HCL 10 MG PO TABS Oral Take 1 tablet (10 mg total) by mouth daily. For acute coronary syndrome 30 tablet 0  . SIMVASTATIN 40 MG PO TABS Oral Take 1 tablet (40 mg total) by mouth at bedtime. To help lower cholesterol. 30 tablet 0    BP 183/93  Pulse 66  Temp(Src) 97.6 F (36.4 C) (Oral)  Resp 14  SpO2 100%  LMP 02/20/2011  Physical Exam  Nursing note and vitals reviewed. Constitutional: She is oriented to person, place, and time. She appears well-developed and well-nourished. No distress.  HENT:  Head: Normocephalic and atraumatic.  Right Ear: External ear normal.  Left Ear: External ear normal.  Mouth/Throat: Oropharynx is clear and moist.  Eyes: Conjunctivae are normal. Right eye exhibits no discharge. Left eye exhibits no discharge. No scleral icterus.  Neck: Neck supple. No tracheal deviation present.  Cardiovascular: Normal  rate, regular rhythm and intact distal pulses.   Pulmonary/Chest: Effort normal and breath sounds normal. No stridor. No respiratory distress. She has no wheezes. She has no rales.  Abdominal: Soft. Bowel sounds are normal. She exhibits no distension. There is no tenderness. There is no rebound and no guarding.  Musculoskeletal: She exhibits no edema and no tenderness.  Neurological: She is alert and oriented to person, place, and time. She has normal strength. No cranial nerve deficit ( no gross defecits noted) or sensory deficit. She exhibits normal muscle tone. She displays no seizure activity. Coordination abnormal.       No pronator drift bilateral upper extrem, able to hold both legs off bed for 5 seconds, sensation intact in all extremities, no visual field cuts, no left or right sided neglect, speech is slurred  Skin: Skin is warm and dry. No rash noted.  Psychiatric: She has a normal mood and affect.    ED Course  Procedures (including critical care time)  Labs Reviewed  CBC - Abnormal; Notable for the following:    RBC 5.13 (*)    RDW 16.9 (*)    All other components within normal limits  COMPREHENSIVE METABOLIC PANEL - Abnormal; Notable for the following:    BUN 4 (*)    ALT 37 (*)    Alkaline Phosphatase 126 (*)    Total Bilirubin 0.2 (*)    All other components within normal limits  URINALYSIS, ROUTINE W REFLEX MICROSCOPIC - Abnormal; Notable for the following:    Hgb urine dipstick LARGE (*)    All other components within normal limits  AMMONIA - Abnormal; Notable for the following:    Ammonia 94 (*)    All other components within normal limits  DIFFERENTIAL  PROTIME-INR  APTT  TYPE AND SCREEN  ETHANOL  URINE RAPID DRUG SCREEN (HOSP PERFORMED)  URINE MICROSCOPIC-ADD ON  ABO/RH   Ct Head Wo Contrast  04/07/2011  *RADIOLOGY REPORT*  Clinical Data: Altered mental status; lethargy and dizziness.  Flat affect and sedated demeanor.  CT HEAD WITHOUT CONTRAST  Technique:   Contiguous axial images were obtained from the base of the skull through the vertex without contrast.  Comparison: MRI of the brain performed 01/24/2011  Findings: There is no evidence of acute infarction, mass lesion, or intra- or extra-axial hemorrhage on CT.  The posterior fossa, including the cerebellum, brainstem and fourth ventricle, is within normal limits.  The third and lateral ventricles, and basal ganglia are unremarkable in appearance.  The cerebral hemispheres are symmetric in appearance, with normal gray- white differentiation.  No  mass effect or midline shift is seen.  There is no evidence of fracture; visualized osseous structures are unremarkable in appearance.  The visualized portions of the orbits are within normal limits.  The paranasal sinuses and mastoid air cells are well-aerated.  No significant soft tissue abnormalities are seen.  IMPRESSION: Unremarkable noncontrast CT of the head.  Original Report Authenticated By: Tonia Ghent, M.D.     1. Altered mental status   2. Hyperammonemia       MDM  I discussed the findings with the patient and recommended that she be admitted to the hospital for her lethargy and elevated ammonia levels. I suspect that there may be a significant component of benzodiazepine abuse. Patient however denies this at this time. I have reviewed the urgent care notes indicating the family's concerns. At this time patient however is alert and oriented. She does not want to stay in the hospital because she has requested benzodiazepine medications right now. I explained to patient with her confusion that is not appropriate to continue those medications  so that we can determine what exactly is going on. Patient understands this and has decided that she will followup with her doctor as an outpatient.  Pt left AMA.  Her elevated ammonia level is somewhat surprising in that her LFTs are only mildly elevated.  She does understand to see her doctor to have this  rechecked.     Celene Kras, MD 04/07/11 [phone]

## 2011-04-06 NOTE — ED Notes (Signed)
Pt  According  To  Family  Members  Has  Been lethargic    Dizzy  With  Flat  Affect   And    Sedated   Demeanor  - pt is  Responsive  Verbally  With  Slow     Responsive  Yet [place]  Aware  Of  Her surroudings      denys  Any pain      Affect is  Flat       Family  Members  At    Bedside     Skin is  Warm  Dry  Cap refill is  Brisk

## 2011-04-06 NOTE — ED Notes (Signed)
Pt via carelink from urgent care. Family is concerned that patient is "delirious". Family states patient is taking numerous ativan a day and are concerned for overdose. Pt alert and oriented, but lethargic. Pt ambulated to stretcher for transport.

## 2011-04-06 NOTE — ED Notes (Signed)
Pt via carelink from urgent care. Pt here d/t family concern of patient taking unknown amount of ativan and/or xanax over the past two days. Pt with flat affect. Pt is alert and oriented, but lethargic. Pt ambulated to stretcher for transport. Pt denies suicidal ideations.

## 2011-04-06 NOTE — Discharge Instructions (Signed)
Transferred to the Emergency Department for further evaluation of suspected benzodiazepine overdose.

## 2011-04-07 ENCOUNTER — Ambulatory Visit: Payer: Self-pay | Admitting: Cardiology

## 2011-04-07 NOTE — Discharge Instructions (Signed)
Altered Mental Status Altered mental status most often refers to an abnormal change in your responsiveness and awareness. It can affect your speech, thought, mobility, memory, attention span, or alertness. It can range from slight confusion to complete unresponsiveness (coma). Altered mental status can be a sign of a serious underlying medical condition. Rapid evaluation and medical treatment is necessary for patients having an altered mental status. CAUSES   Low blood sugar (hypoglycemia) or diabetes.   Severe loss of body fluids (dehydration) or a body salt (electrolyte) imbalance.   A stroke or other neurologic problem, such as dementia or delirium.   A head injury or tumor.   A drug or alcohol overdose.   Exposure to toxins or poisons.   Depression, anxiety, and stress.   A low oxygen level (hypoxia).   An infection.   Blood loss.   Twitching or shaking (seizure).   Heart problems, such as heart attack or heart rhythm problems (arrhythmias).   A body temperature that is too low or too high (hypothermia or hyperthermia).  DIAGNOSIS  A diagnosis is based on your history, symptoms, physical and neurologic examinations, and diagnostic tests. Diagnostic tests may include:  Measurement of your blood pressure, pulse, breathing, and oxygen levels (vital signs).   Blood tests.   Urine tests.   X-ray exams.   A computerized magnetic scan (magnetic resonance imaging, MRI).   A computerized X-ray scan (computed tomography, CT scan).  TREATMENT  Treatment will depend on the cause. Treatment may include:  Management of an underlying medical or mental health condition.   Critical care or support in the hospital.  HOME CARE INSTRUCTIONS   Only take over-the-counter or prescription medicines for pain, discomfort, or fever as directed by your caregiver.   Manage underlying conditions as directed by your caregiver.   Eat a healthy, well-balanced diet to maintain strength.    Join a support group or prevention program to cope with the condition or trauma that caused the altered mental status. Ask your caregiver to help choose a program that works for you.   Follow up with your caregiver for further examination, therapy, or testing as directed.  SEEK MEDICAL CARE IF:   You feel unwell or have chills.   You or your family notice a change in your behavior or your alertness.   You have trouble following your caregiver's treatment plan.   You have questions or concerns.  SEEK IMMEDIATE MEDICAL CARE IF:   You have a rapid heartbeat or have chest pain.   You have difficulty breathing.   You have a fever.   You have a headache with a stiff neck.   You cough up blood.   You have blood in your urine or stool.   You have severe agitation or confusion.  MAKE SURE YOU:   Understand these instructions.   Will watch your condition.   Will get help right away if you are not doing well or get worse.  Document Released: 07/15/2009 Document Revised: 10/07/2010 Document Reviewed: 07/15/2009 [facility] Patient Information 2012 [place], [place].Confusion Confusion is the inability to think with your usual speed or clarity. Confusion may come on quickly or slowly over time. How quickly the confusion comes on depends on the cause. Confusion can be due to any number of causes. CAUSES   Concussion, head injury, or head trauma.   Seizures.   Stroke.   Fever.   Senility.   Heightened emotional states like rage or terror.   Mental illness in which  the person loses the ability to determine what is real and what is not (hallucinations).   Infections.   Toxic effects from alcohol, drugs, or prescription medicines.   Dehydration and an imbalance of salts in the body (electrolytes).   Lack of sleep.   Low blood sugar (diabetes).   Low levels of oxygen (for example from chronic lung disorders).   Drug interactions or other medication side effects.    Nutritional deficiencies, especially niacin, thiamine, vitamin C, or vitamin B.   Sudden drop in body temperature (hypothermia).   Illness in the elderly. Constipation can result in confusion. An elderly person who is hospitalized may become confused due to change in daily routine.  SYMPTOMS  People often describe their thinking as cloudy or unclear when they are confused. Confusion can also include feeling disoriented. That means you are unaware of where or who you are. You may also not know what the date or time is. If confused, you may also have difficulty paying attention, remembering and making decisions. Some people also act aggressively when they are confused.  DIAGNOSIS  The medical evaluation of confusion may include:  Blood and urine tests.   X-rays.   Brain and nervous system tests.   Analyzing your brain waves (electroencphalogram or EEG).   A special X-ray (MRI) of your head or other special studies.  Your physician will ask questions such as:  Do you get days and nights mixed up?   Are you awake during regular sleep times?   Do you have trouble recognizing people?   Do you know where you are?   Do you know the date and time?   Does the confusion come and go?   Is the confusion quickly getting worse?   Has there been a recent illness?   Has there been a recent head injury?   Are you diabetic?   Do you have a lung disorder?   What medication are you taking?   Have you taken drugs or alcohol?  TREATMENT  An admission to the hospital may not be needed, but a confused person should not be left alone. Stay with a family member or friend until the confusion clears. Avoid alcohol, pain relievers or sedative drugs until you have fully recovered. Do not drive until your caregiver says it is okay. HOME CARE INSTRUCTIONS What family and friends can do:  To find out if someone is confused ask him or her their name, age, and the date. If the person is unsure or  answers incorrectly, he or she is confused.   Always introduce yourself, no matter how well the person knows you.   Often remind the person of his or her location.   Place a calendar and clock near the confused person.   Talk about current events and plans for the day.   Try to keep the environment calm, quiet and peaceful.   Make sure the patient keeps follow up appointments with their physician.  PREVENTION  Ways to prevent confusion:  Avoid alcohol.   Eat a balanced diet.   Get enough sleep.   Do not become isolated. Spend time with other people and make plans for your days.   Keep careful watch on your blood sugar levels if you are diabetic.  SEEK IMMEDIATE MEDICAL CARE IF:   You develop severe headaches, repeated vomiting, seizures, blackouts or slurred speech.   There is increasing confusion, weakness, numbness, restlessness or personality changes.   You develop a loss of balance,  have marked dizziness, feel uncoordinated or fall.   You have delusions, hallucinations or develop severe anxiety.   Your family members think you need to be rechecked.  Document Released: 03/04/2004 Document Revised: 10/07/2010 Document Reviewed: 10/31/2007 [facility] Patient Information 2012 [place], [place].

## 2011-04-10 ENCOUNTER — Encounter (HOSPITAL_COMMUNITY): Payer: Self-pay | Admitting: *Deleted

## 2011-04-10 ENCOUNTER — Emergency Department (HOSPITAL_COMMUNITY)
Admission: EM | Admit: 2011-04-10 | Discharge: 2011-04-10 | Disposition: A | Discharge status: Home Health | Payer: Medicaid Other | Attending: Emergency Medicine | Admitting: Emergency Medicine

## 2011-04-10 ENCOUNTER — Other Ambulatory Visit: Payer: Self-pay

## 2011-04-10 DIAGNOSIS — Z79899 Other long term (current) drug therapy: Secondary | ICD-10-CM | POA: Insufficient documentation

## 2011-04-10 DIAGNOSIS — R42 Dizziness and giddiness: Secondary | ICD-10-CM | POA: Insufficient documentation

## 2011-04-10 DIAGNOSIS — H532 Diplopia: Secondary | ICD-10-CM | POA: Insufficient documentation

## 2011-04-10 DIAGNOSIS — T43505A Adverse effect of unspecified antipsychotics and neuroleptics, initial encounter: Secondary | ICD-10-CM | POA: Insufficient documentation

## 2011-04-10 DIAGNOSIS — I10 Essential (primary) hypertension: Secondary | ICD-10-CM | POA: Insufficient documentation

## 2011-04-10 DIAGNOSIS — I252 Old myocardial infarction: Secondary | ICD-10-CM | POA: Insufficient documentation

## 2011-04-10 DIAGNOSIS — K219 Gastro-esophageal reflux disease without esophagitis: Secondary | ICD-10-CM | POA: Insufficient documentation

## 2011-04-10 DIAGNOSIS — T50905A Adverse effect of unspecified drugs, medicaments and biological substances, initial encounter: Secondary | ICD-10-CM

## 2011-04-10 DIAGNOSIS — R296 Repeated falls: Secondary | ICD-10-CM | POA: Insufficient documentation

## 2011-04-10 DIAGNOSIS — H538 Other visual disturbances: Secondary | ICD-10-CM | POA: Insufficient documentation

## 2011-04-10 DIAGNOSIS — I2510 Atherosclerotic heart disease of native coronary artery without angina pectoris: Secondary | ICD-10-CM | POA: Insufficient documentation

## 2011-04-10 DIAGNOSIS — F319 Bipolar disorder, unspecified: Secondary | ICD-10-CM | POA: Insufficient documentation

## 2011-04-10 DIAGNOSIS — Z7982 Long term (current) use of aspirin: Secondary | ICD-10-CM | POA: Insufficient documentation

## 2011-04-10 DIAGNOSIS — E785 Hyperlipidemia, unspecified: Secondary | ICD-10-CM | POA: Insufficient documentation

## 2011-04-10 LAB — BASIC METABOLIC PANEL
BUN: 9 mg/dL (ref 6–23)
CO2: 24 mEq/L (ref 19–32)
Calcium: 9.1 mg/dL (ref 8.4–10.5)
Chloride: 106 mEq/L (ref 96–112)
Creatinine, Ser: 0.84 mg/dL (ref 0.50–1.10)
GFR calc Af Amer: >90 mL/min (ref >90)
GFR calc non Af Amer: 83 mL/min — ABNORMAL LOW (ref >90)
Glucose, Bld: 113 mg/dL — ABNORMAL HIGH (ref 70–99)
Potassium: 3.4 mEq/L — ABNORMAL LOW (ref 3.5–5.1)
Sodium: 140 mEq/L (ref 135–145)

## 2011-04-10 LAB — HEPATIC FUNCTION PANEL
ALT: 17 U/L (ref 0–35)
AST: 16 U/L (ref 0–37)
Albumin: 3.2 g/dL — ABNORMAL LOW (ref 3.5–5.2)
Alkaline Phosphatase: 81 U/L (ref 39–117)
Bilirubin, Direct: <0.1 mg/dL (ref 0.0–0.3)
Total Bilirubin: 0.2 mg/dL — ABNORMAL LOW (ref 0.3–1.2)
Total Protein: 6.4 g/dL (ref 6.0–8.3)

## 2011-04-10 LAB — CBC
HCT: 35.2 % — ABNORMAL LOW (ref 36.0–46.0)
Hemoglobin: 11.6 g/dL — ABNORMAL LOW (ref 12.0–15.0)
MCH: 27 pg (ref 26.0–34.0)
MCHC: 33 g/dL (ref 30.0–36.0)
MCV: 82.1 fL (ref 78.0–100.0)
Platelets: 211 10*3/uL (ref 150–400)
RBC: 4.29 MIL/uL (ref 3.87–5.11)
RDW: 16.6 % — ABNORMAL HIGH (ref 11.5–15.5)
WBC: 4.3 10*3/uL (ref 4.0–10.5)

## 2011-04-10 LAB — GLUCOSE, CAPILLARY: Glucose-Capillary: 135 mg/dL — ABNORMAL HIGH (ref 70–99)

## 2011-04-10 LAB — URINALYSIS, ROUTINE W REFLEX MICROSCOPIC
Glucose, UA: NEGATIVE mg/dL
Hgb urine dipstick: NEGATIVE
Ketones, ur: 15 mg/dL — AB
Leukocytes, UA: NEGATIVE
Nitrite: NEGATIVE
Protein, ur: NEGATIVE mg/dL
Specific Gravity, Urine: 1.034 — ABNORMAL HIGH (ref 1.005–1.030)
Urobilinogen, UA: 1 mg/dL (ref 0.0–1.0)
pH: 6.5 (ref 5.0–8.0)

## 2011-04-10 LAB — DIFFERENTIAL
Basophils Absolute: 0 10*3/uL (ref 0.0–0.1)
Basophils Relative: 1 % (ref 0–1)
Eosinophils Absolute: 0.1 10*3/uL (ref 0.0–0.7)
Eosinophils Relative: 2 % (ref 0–5)
Lymphocytes Relative: 29 % (ref 12–46)
Lymphs Abs: 1.2 10*3/uL (ref 0.7–4.0)
Monocytes Absolute: 0.4 10*3/uL (ref 0.1–1.0)
Monocytes Relative: 10 % (ref 3–12)
Neutro Abs: 2.5 10*3/uL (ref 1.7–7.7)
Neutrophils Relative %: 59 % (ref 43–77)

## 2011-04-10 LAB — RAPID URINE DRUG SCREEN, HOSP PERFORMED
Amphetamines: NOT DETECTED
Barbiturates: NOT DETECTED
Benzodiazepines: NOT DETECTED
Cocaine: NOT DETECTED
Opiates: NOT DETECTED
Tetrahydrocannabinol: NOT DETECTED

## 2011-04-10 LAB — VALPROIC ACID LEVEL: Valproic Acid Lvl: 87 ug/mL (ref 50.0–100.0)

## 2011-04-10 LAB — AMMONIA: Ammonia: 88 umol/L — ABNORMAL HIGH (ref 11–60)

## 2011-04-10 LAB — ETHANOL: Alcohol, Ethyl (B): <11 mg/dL (ref 0–11)

## 2011-04-10 MED ORDER — POTASSIUM CHLORIDE 20 MEQ/15ML (10%) PO LIQD
40.0000 meq | Freq: Once | ORAL | Status: DC
  Filled 2011-04-10: qty 30

## 2011-04-10 MED ORDER — POTASSIUM CHLORIDE CRYS ER 20 MEQ PO TBCR
EXTENDED_RELEASE_TABLET | ORAL | Status: AC
  Administered 2011-04-10: 40 meq
  Filled 2011-04-10: qty 2

## 2011-04-10 MED ORDER — SODIUM CHLORIDE 0.9 % IV BOLUS (SEPSIS)
500.0000 mL | Freq: Once | INTRAVENOUS | Status: AC
  Administered 2011-04-10: 500 mL via INTRAVENOUS

## 2011-04-10 NOTE — ED Notes (Signed)
Reports intermittent blurred vision/double vision x 1 month, having frequent falls x 1 week. No acute distress noted at triage.

## 2011-04-10 NOTE — ED Notes (Signed)
Attempted iv access, unable, iv team paged

## 2011-04-10 NOTE — ED Provider Notes (Signed)
5:12 PM The patient is awake, alert, and oriented in no apparent distress and is ambulating steadily through the halls. She has no complaint of symptoms at this time. Her laboratory studies have returned without provocative results. Her pneumonia level is elevated but it is decreased from its prior value in the patient has no history of liver disease or signs of asterixis, ataxia, incoordination, hallucinations or other symptoms to suggest hepatic encephalopathy. The patient will be discharged home to followup with primary care and neurology as needed. The patient states her understanding of and agreement with the plan of care.  Felisa Bonier, MD 04/10/11 [id number]

## 2011-04-10 NOTE — ED Provider Notes (Signed)
Medical screening examination/treatment/procedure(s) were conducted as a shared visit with non-physician practitioner(s) and myself.  I personally evaluated the patient during the encounter  Pt with dizziness and blurred vision x 1-2 months--has neg mri 2 months ago, suspect medication related ( takes vistiril) or medication reaction ( depakote)--neuro exam non focal, no concern for cva or tia--pt to be d/c after labs results  Toy Baker, MD 04/10/11 1545

## 2011-04-10 NOTE — ED Notes (Signed)
Dr.Allen to eval ecg.

## 2011-04-10 NOTE — ED Provider Notes (Signed)
History     CSN: [id number]  Arrival date & time 04/10/11  1329   First MD Initiated Contact with Patient 04/10/11 1353      Chief Complaint  Patient presents with  . Blurred Vision  . Fall    (Consider location/radiation/quality/duration/timing/severity/associated sxs/prior treatment) HPI  Patient presents to emergency department complaining of a multiple week to multiple month history of intermittent dizziness, double vision, frequent falls due to to loss of coordination, slow thinking and memory problems. Patient states she was seen recently in the ER and per ER record was seen on February the 26th for similar complaints and left AMA after Iantha Fallen M.D. advised admission to hospital for further evaluation and management of an elevated ammonia that he thought was concerning in light of her multiple symptoms and complaints. States she did not followup with her primary care physician as she had planned to at that time of leaving AMA.  Patient was also seen in December with complaints of dizziness, confusion, and difficulty in ambulating per history taken by herself and review of EPIC. At that time she had a negative MRI. Patient states that for the last few months she has intermittent symptoms but that over the last week symptoms have progressively worsened. She denies aggravating or alleviating factors. She's taken nothing for symptoms prior to arrival. Patient states she has a history of benzodiazepine abuse but denies any benzodiazepine use for greater than one week. Patient also states she has a history of coronary artery disease but denies any chest pain or shortness of breath. She denies fevers, chills, headache, neck stiffness, chest pain, shortness of breath, abdominal pain, nausea, vomiting, diarrhea, dysuria, hematuria or blood in her stool. She smokes tobacco but denies illicit drug use or alcohol use.  Patient also mentions recently starting vistaril over the last couple of weeks and  is wondering if that could be causing the increasing severity of symptoms.   Past Medical History  Diagnosis Date  . CAD (coronary artery disease)     a. NSTEMI 8/12 tx with DES to [facility] and DES to pRCA;  Cardiac cath 09/29/10: oLAD 25%, m-dLAD 25-30%, mLAD 40%, D1 30%, mCFX 99%, pOM1 25%, pRCA 80% and 70%, oPDA 25%, EF 60%;  echo 8/12: EF 55-60%, mod LVH  . Hypertension   . Bipolar disorder   . GERD (gastroesophageal reflux disease)   . Dyslipidemia   . Tobacco abuse   . History of alcohol abuse   . Myocardial infarction     Sep 28, 2010    Past Surgical History  Procedure Date  . Cardiac catheterization     Family History  Problem Relation Age of Onset  . Hypertension Mother   . Mental illness Mother     History  Substance Use Topics  . Smoking status: Current Everyday Smoker -- 1.0 packs/day    Types: Cigarettes  . Smokeless tobacco: Not on file  . Alcohol Use: No     REMOTE USE    OB History    Grav Para Term Preterm Abortions TAB SAB Ect Mult Living                  Review of Systems  All other systems reviewed and are negative.    Allergies  Codeine and Vicodin  Home Medications   Current Outpatient Rx  Name Route Sig Dispense Refill  . ASPIRIN EC 81 MG PO TBEC Oral Take 81 mg by mouth daily. For prevention of heart attack and  stroke    . DIVALPROEX SODIUM ER 500 MG PO TB24 Oral Take 500 mg by mouth daily. For mood control    . ESCITALOPRAM OXALATE 20 MG PO TABS Oral Take 20 mg by mouth daily.    Marland Kitchen METOPROLOL TARTRATE 25 MG PO TABS Oral Take 75 mg by mouth 2 (two) times daily.    Marland Kitchen PRASUGREL HCL 10 MG PO TABS Oral Take 10 mg by mouth. For acute coronary syndrome    . SIMVASTATIN 40 MG PO TABS Oral Take 40 mg by mouth every evening. To lower cholesterol      BP 136/76  Pulse 86  Resp 16  SpO2 97%  LMP 02/20/2011  Physical Exam  Nursing note and vitals reviewed. Constitutional: She is oriented to person, place, and time. She appears  well-developed and well-nourished. No distress.  HENT:  Head: Normocephalic and atraumatic.  Eyes: Conjunctivae and EOM are normal. Pupils are equal, round, and reactive to light. Right eye exhibits no nystagmus. Left eye exhibits no nystagmus.  Neck: Normal range of motion. Neck supple.  Cardiovascular: Normal rate, regular rhythm, normal heart sounds and intact distal pulses.  Exam reveals no gallop and no friction rub.   No murmur heard. Pulmonary/Chest: Effort normal and breath sounds normal. No respiratory distress. She has no wheezes. She has no rales. She exhibits no tenderness.  Abdominal: Soft. Bowel sounds are normal. She exhibits no distension and no mass. There is no tenderness. There is no rebound and no guarding.  Musculoskeletal: Normal range of motion. She exhibits no edema and no tenderness.  Neurological: She is alert and oriented to person, place, and time. She has normal reflexes. No cranial nerve deficit. Coordination normal.       Normal gait without ataxia. Normal finger to nose testing.  Skin: Skin is warm and dry. No rash noted. She is not diaphoretic. No erythema.  Psychiatric: She has a normal mood and affect.    ED Course  Procedures (including critical care time)  IV fluids  Patient is ambulating without difficulty.    Date: 04/10/2011  Rate: 69  Rhythm: normal sinus rhythm  QRS Axis: normal  Intervals: normal  ST/T Wave abnormalities: normal  Conduction Disutrbances: none, borderline intraventricular conduction delay  Narrative Interpretation:   Old EKG Reviewed: non provocative EKG compared to Apr 06, 2011. No significant changes noted     Labs Reviewed  CBC - Abnormal; Notable for the following:    Hemoglobin 11.6 (*)    HCT 35.2 (*)    RDW 16.6 (*)    All other components within normal limits  BASIC METABOLIC PANEL - Abnormal; Notable for the following:    Potassium 3.4 (*)    Glucose, Bld 113 (*)    GFR calc non Af Amer 83 (*)    All  other components within normal limits  URINALYSIS, ROUTINE W REFLEX MICROSCOPIC - Abnormal; Notable for the following:    Color, Urine AMBER (*) BIOCHEMICALS MAY BE AFFECTED BY COLOR   Specific Gravity, Urine 1.034 (*)    Bilirubin Urine SMALL (*)    Ketones, ur 15 (*)    All other components within normal limits  HEPATIC FUNCTION PANEL - Abnormal; Notable for the following:    Albumin 3.2 (*)    Total Bilirubin 0.2 (*)    All other components within normal limits  GLUCOSE, CAPILLARY - Abnormal; Notable for the following:    Glucose-Capillary 135 (*)    All other components within normal  limits  DIFFERENTIAL  ETHANOL  URINE RAPID DRUG SCREEN (HOSP PERFORMED)  AMMONIA  VALPROIC ACID LEVEL   No results found.   No diagnosis found.  Patient seen by myself and Dr. Freida Busman  MDM  Patient signed out to Dr. Ardeen Fillers. Patient has been having numerous symptoms x multiple months without any objective findings. No neurofocal findings, ataxia, nystagmus. Pending labs. If labs are normal then patient is appropriate for OP follow up with PCP and neurology. At this time, I do not see a reason to obtain MRI in ER seeing that she had negative MR in December with similar complaints without objective neurological findings. If labs show increasing ammonia or other abnormalities then re-eval and consideration for admission by Dr. Doylene Canard.         [place], [place] 04/10/11 1547

## 2011-04-12 ENCOUNTER — Emergency Department (HOSPITAL_COMMUNITY)
Admission: EM | Admit: 2011-04-12 | Discharge: 2011-04-12 | Disposition: A | Discharge status: Home / Self Care | Payer: Medicaid Other | Attending: Emergency Medicine | Admitting: Emergency Medicine

## 2011-04-12 ENCOUNTER — Telehealth: Payer: Self-pay | Admitting: *Deleted

## 2011-04-12 ENCOUNTER — Encounter (HOSPITAL_COMMUNITY): Payer: Self-pay | Admitting: Emergency Medicine

## 2011-04-12 DIAGNOSIS — F17200 Nicotine dependence, unspecified, uncomplicated: Secondary | ICD-10-CM | POA: Insufficient documentation

## 2011-04-12 DIAGNOSIS — I998 Other disorder of circulatory system: Secondary | ICD-10-CM | POA: Insufficient documentation

## 2011-04-12 DIAGNOSIS — K130 Diseases of lips: Secondary | ICD-10-CM | POA: Insufficient documentation

## 2011-04-12 DIAGNOSIS — Z79899 Other long term (current) drug therapy: Secondary | ICD-10-CM | POA: Insufficient documentation

## 2011-04-12 DIAGNOSIS — R220 Localized swelling, mass and lump, head: Secondary | ICD-10-CM | POA: Insufficient documentation

## 2011-04-12 DIAGNOSIS — I10 Essential (primary) hypertension: Secondary | ICD-10-CM | POA: Insufficient documentation

## 2011-04-12 DIAGNOSIS — Z7982 Long term (current) use of aspirin: Secondary | ICD-10-CM | POA: Insufficient documentation

## 2011-04-12 DIAGNOSIS — K219 Gastro-esophageal reflux disease without esophagitis: Secondary | ICD-10-CM | POA: Insufficient documentation

## 2011-04-12 DIAGNOSIS — F319 Bipolar disorder, unspecified: Secondary | ICD-10-CM | POA: Insufficient documentation

## 2011-04-12 DIAGNOSIS — E785 Hyperlipidemia, unspecified: Secondary | ICD-10-CM | POA: Insufficient documentation

## 2011-04-12 DIAGNOSIS — M25519 Pain in unspecified shoulder: Secondary | ICD-10-CM | POA: Insufficient documentation

## 2011-04-12 MED ORDER — LIDOCAINE VISCOUS 2 % MT SOLN
20.0000 mL | Freq: Once | OROMUCOSAL | Status: AC
  Administered 2011-04-12: 20 mL via OROMUCOSAL
  Filled 2011-04-12: qty 20

## 2011-04-12 NOTE — Telephone Encounter (Signed)
PT WALKED IN WITH C/O OF BRUISING WAS SEEN IN ER ON 04-10-11 AND  TODAY WITH SAME COMPLAINT. CBC  FROM 04-10-11  SHOWS  HGB 11.6 PT   BRUISIING NOTED TO FOREARM. PT NO SHOWED ON 04-07-11  . ALSO PT WANTS TO KNOW IF  MAY JOIN CARDIAC REHAB .PT TAKEN TO CHECK OUT TO HAVE APPT RESCHEDULED AND WILL FORWARD TO DR WALL FOR REVIEW RE  BRUISING AND CARDIAC REHAB ISSUES./CY

## 2011-04-12 NOTE — Discharge Instructions (Signed)
Please use the numbing medication on the inside of your lips as needed for burning.  Avoid sodas and drinks that make the burning worse.  If you notice any change in your skin or you develop sore throat or fevers, please return to the ER or see your primary care provider or the urgent care for a recheck.  Please see your cardiologist today as planned for your regular blood work.  You may return to the ER at any time for worsening condition or any new symptoms that concern you.  If you have no primary doctor, here are some resources that may be helpful:  Medicaid-accepting [facility] Providers:   - [facility]- [address]      [phone]      Mon-Fri 9am-7pm, Sat 9am-1pm   - [facility]- [address] [place]      [phone]   - [facility]- [address] [place]      [phone]   [facility]- [address]      [phone]   - Renaye Rakers- [address] [place], [address]      [phone]      Only accepts [place] patients       after they have her name applied to their card   Self Pay (no insurance) in [place]:   - Sickle Cell Patients: Dr Willey Blade, [facility]      [address] [place]      [phone]   - [phone]   - Physician Referral Service- [phone]   - [facility]- [address] [place]      [phone]   [facility] [place]- [address]   - [facility]- see information above      (Speak to [employer] if you do not have insurance)   - [facility]- [address] [place]      [phone]   - [facility] [place]- [address]      [phone]   - [facility]- [address]      [phone]   - Dr Julio Sicks-  [address], [place]      [phone]   - [facility]- [address]      [phone]   - [facility]- [address]      [phone]      Also [address]      [phone]   - [facility]- [address]      [phone]      1st and 3rd Saturday every month, 10am-1pm Other agencies that provide inexpensive medical care:    [facility]  [phone]    [facility]  [phone]    [facility]  [phone]    [facility]  [phone]    [facility]  [phone]  General Information: Finding a doctor when you do not have health insurance can be tricky. Although you are not limited by an insurance plan, you are of course limited by her finances and how much but he can pay out of pocket.  What are your options if you don't have health insurance?   1) Find a Librarian, academic and Pay Out of Pocket Although  you won't have to find out who is covered by your insurance plan, it is a good idea to ask around and get recommendations. You will then need to call the office and see if the doctor you have chosen will accept you as a new patient and what types of options they offer for patients who are self-pay. Some doctors offer discounts or will set up payment plans for their patients who do not have insurance, but you will need to ask so you aren't surprised when you get to your appointment.  2) Contact Your Local Health Department Not all health departments have doctors that can see patients for sick visits, but many do, so it is worth a call to see if yours does. If you don't know where your local health department is, you can check in your phone book. The CDC also has a tool to help you locate your state's health department, and many state websites also have listings of all of their local health departments.  3) Find a Walk-in Clinic If your illness is not likely to be very severe or complicated, you may want to try a walk in clinic. These are popping up all over the country in pharmacies, drugstores, and shopping centers. They're usually staffed by  nurse practitioners or physician assistants that have been trained to treat common illnesses and complaints. They're usually fairly quick and inexpensive. However, if you have serious medical issues or chronic medical problems, these are probably not your best option

## 2011-04-12 NOTE — ED Provider Notes (Signed)
History     CSN: [id number]  Arrival date & time 04/12/11  1230   First MD Initiated Contact with Patient 04/12/11 1255      Chief Complaint  Patient presents with  . Oral Swelling    Pt reports swelling on burning on upper lip x3 days. No swelling noted  . Bleeding/Bruising    Multiple bruises on arms. Pt missed last appt. for INR. Schedule for labs today    (Consider location/radiation/quality/duration/timing/severity/associated sxs/prior treatment) HPI Comments: Patient reports three days of burning on the inside of her lips.  States that she first noticed it when she was drinking a potassium drink given to her by the Hospital.  States that she has burning whenever she drinks sodas, but does not have any burning when she eats or when she drinks water.  States that her boyfriend cheated on her and she was concerned that she might have gotten of "venereal disease" or mono on her lips from him.  Denies fevers, sore throat, difficulty swallowing or breathing, chest pain, shortness of breath, abnormal vaginal discharge or bleeding, or vaginal itching or pain.  Patient also notes she woke up this morning with left shoulder pain.  Patient states she feels like she has a "crick" in her shoulder that she needs to work out.  Reports that the pain is exacerbated by moving her arm around.  Denies chest pain, shortness of breath.  The pain is not exacerbated by exertion.  It is not pleuritic. Patient does have a history of MI, but states that this is nothing like it.  Patient also notes that she is having bruising and is on a blood thinner.  Patient states that the majority of the bruising is from when she was recently in the hospital and had several blood draws.  But states that she also is having a few bruises on her extremities and her buttocks that are not related to blood draws.  Patient states she has an appointment with the cardiology office this afternoon to have her INR checked.  The history is  provided by the patient.    Past Medical History  Diagnosis Date  . CAD (coronary artery disease)     a. NSTEMI 8/12 tx with DES to [facility] and DES to pRCA;  Cardiac cath 09/29/10: oLAD 25%, m-dLAD 25-30%, mLAD 40%, D1 30%, mCFX 99%, pOM1 25%, pRCA 80% and 70%, oPDA 25%, EF 60%;  echo 8/12: EF 55-60%, mod LVH  . Hypertension   . Bipolar disorder   . GERD (gastroesophageal reflux disease)   . Dyslipidemia   . Tobacco abuse   . History of alcohol abuse   . Myocardial infarction     Sep 28, 2010    Past Surgical History  Procedure Date  . Cardiac catheterization     Family History  Problem Relation Age of Onset  . Hypertension Mother   . Mental illness Mother     History  Substance Use Topics  . Smoking status: Current Everyday Smoker -- 1.0 packs/day    Types: Cigarettes  . Smokeless tobacco: Not on file  . Alcohol Use: No     REMOTE USE    OB History    Grav Para Term Preterm Abortions TAB SAB Ect Mult Living                  Review of Systems  HENT: Negative for sore throat and trouble swallowing.   Respiratory: Negative for cough and shortness of breath.  Cardiovascular: Negative for chest pain.  Genitourinary: Negative for dysuria, urgency, frequency, vaginal bleeding, vaginal discharge and vaginal pain.  Neurological: Negative for dizziness, syncope, weakness, light-headedness and numbness.  All other systems reviewed and are negative.    Allergies  Codeine and Vicodin  Home Medications   Current Outpatient Rx  Name Route Sig Dispense Refill  . ASPIRIN EC 81 MG PO TBEC Oral Take 81 mg by mouth daily. For prevention of heart attack and stroke    . DIVALPROEX SODIUM ER 500 MG PO TB24 Oral Take 500 mg by mouth daily. For mood control    . ESCITALOPRAM OXALATE 20 MG PO TABS Oral Take 20 mg by mouth daily.    Marland Kitchen METOPROLOL TARTRATE 25 MG PO TABS Oral Take 75 mg by mouth 2 (two) times daily.    Marland Kitchen PRASUGREL HCL 10 MG PO TABS Oral Take 10 mg by mouth. For acute  coronary syndrome    . SIMVASTATIN 40 MG PO TABS Oral Take 40 mg by mouth every evening. To lower cholesterol      BP 145/96  Pulse 85  Temp(Src) 98.2 F (36.8 C) (Oral)  Resp 18  SpO2 100%  LMP 04/12/2011  Physical Exam  Nursing note and vitals reviewed. Constitutional: She is oriented to person, place, and time. She appears well-developed and well-nourished. No distress.  HENT:  Head: Normocephalic and atraumatic.  Mouth/Throat: Uvula is midline, oropharynx is clear and moist and mucous membranes are normal. Mucous membranes are not dry. No oral lesions. No oropharyngeal exudate, posterior oropharyngeal edema, posterior oropharyngeal erythema or tonsillar abscesses.       Lips are dry, nontender, no pain or burning externally.  Mucous membrane of lips normal in appearance, no skin changes, no exudate, no lesions.  No edema. Burning is exacerbated by palpation.    Neck: Neck supple.  Cardiovascular: Normal rate, regular rhythm and normal heart sounds.   Pulmonary/Chest: Breath sounds normal. No respiratory distress. She has no wheezes. She has no rales. She exhibits no tenderness.  Abdominal: Soft. Bowel sounds are normal. She exhibits no distension and no mass. There is no tenderness. There is no rebound and no guarding.  Neurological: She is alert and oriented to person, place, and time.  Skin: She is not diaphoretic.    ED Course  Procedures (including critical care time)  Labs Reviewed - No data to display No results found.  12:59 PM I went to see the patient and she asked me to come back later as she is speaking on the cell phone.  1:43 PM Patient not in room.   1. Lip pain       MDM  Afebrile patient with "burning" pain on the mucosal surface of lips - upper and lower, on both sides.  There are no changes to the mucous membrane, no concern for infection or injury.  Burning only occurs with sodas and potassium drink.  I have advised her to avoid drinks that exacerbate  the symptoms, drink plenty of water, and return to the ED or see her doctor if she notices any skin changes, has worsening symptoms, or develops symptoms in her throat, fever.  Patient verbalizes understanding and agrees with plan.    Pt with other complaints including bruising and missed INR check, but patient had an appointment with cardiology this afternoon to address these issues, declined our addressing this here.    Pt also with left shoulder pain that appears musculoskeletal in nature and mild - doubt ACS.  Pt without CP, SOB. Pain is not exertional.          Rise Patience, [place] 04/12/11 1607

## 2011-04-12 NOTE — ED Provider Notes (Signed)
Medical screening examination/treatment/procedure(s) were performed by non-physician practitioner and as supervising physician I was immediately available for consultation/collaboration.   Brasen Bundren A Annelisa Ryback, MD 04/12/11 1636 

## 2011-04-13 NOTE — ED Provider Notes (Signed)
Medical screening examination/treatment/procedure(s) were conducted as a shared visit with non-physician practitioner(s) and myself.  I personally evaluated the patient during the encounter  Toy Baker, MD 04/13/11 [phone]

## 2011-04-15 NOTE — Telephone Encounter (Signed)
Number listed does not have a voicemail set up. Mylo Red RN

## 2011-05-31 ENCOUNTER — Ambulatory Visit (INDEPENDENT_AMBULATORY_CARE_PROVIDER_SITE_OTHER): Payer: Medicaid Other | Admitting: Cardiology

## 2011-05-31 ENCOUNTER — Encounter: Payer: Self-pay | Admitting: Cardiology

## 2011-05-31 VITALS — BP 150/90 | HR 88 | Ht 60.5 in | Wt 163.0 lb

## 2011-05-31 DIAGNOSIS — E785 Hyperlipidemia, unspecified: Secondary | ICD-10-CM

## 2011-05-31 DIAGNOSIS — Z720 Tobacco use: Secondary | ICD-10-CM

## 2011-05-31 DIAGNOSIS — F17200 Nicotine dependence, unspecified, uncomplicated: Secondary | ICD-10-CM

## 2011-05-31 DIAGNOSIS — I2510 Atherosclerotic heart disease of native coronary artery without angina pectoris: Secondary | ICD-10-CM

## 2011-05-31 DIAGNOSIS — I10 Essential (primary) hypertension: Secondary | ICD-10-CM

## 2011-05-31 DIAGNOSIS — E782 Mixed hyperlipidemia: Secondary | ICD-10-CM | POA: Insufficient documentation

## 2011-05-31 MED ORDER — METOPROLOL TARTRATE 25 MG PO TABS: 75.0000 mg | ORAL_TABLET | Freq: Two times a day (BID) | ORAL | Status: DC

## 2011-05-31 MED ORDER — PRASUGREL HCL 10 MG PO TABS: 10.0000 mg | ORAL_TABLET | Freq: Every day | ORAL | Status: DC

## 2011-05-31 MED ORDER — SIMVASTATIN 40 MG PO TABS: 40.0000 mg | ORAL_TABLET | Freq: Every evening | ORAL | Status: DC

## 2011-05-31 MED ORDER — HYDROCHLOROTHIAZIDE 12.5 MG PO CAPS: 12.5000 mg | ORAL_CAPSULE | Freq: Every day | ORAL | Status: DC

## 2011-05-31 MED ORDER — POTASSIUM CHLORIDE ER 10 MEQ PO TBCR: 20.0000 meq | EXTENDED_RELEASE_TABLET | Freq: Two times a day (BID) | ORAL | Status: DC

## 2011-05-31 NOTE — Progress Notes (Signed)
HPI  This progress comes in today for evaluation and management of her history of myocardial infarction in 2012 with drug-eluting stents to the circumflex and right coronary artery.  She has a history of serious noncompliance and polysubstance abuse. She has missed her last 2 appointments.   She has normal left ventricular function. She has not followed up with blood work.  She denies any chest pain or angina. She has shoulder pain which sounds chronic and does not sound cardiac.  She needs her meds refilled today. She says she is taking all of.had disability from having stents in her heart.  Her blood pressures been running high at home. Is about what is today in the office.  Past Medical History  Diagnosis Date  . CAD (coronary artery disease)     a. NSTEMI 8/12 tx with DES to [facility] and DES to pRCA;  Cardiac cath 09/29/10: oLAD 25%, m-dLAD 25-30%, mLAD 40%, D1 30%, mCFX 99%, pOM1 25%, pRCA 80% and 70%, oPDA 25%, EF 60%;  echo 8/12: EF 55-60%, mod LVH  . Hypertension   . Bipolar disorder   . GERD (gastroesophageal reflux disease)   . Dyslipidemia   . Tobacco abuse   . History of alcohol abuse   . Myocardial infarction     Sep 28, 2010    Current Outpatient Prescriptions  Medication Sig Dispense Refill  . aspirin EC 81 MG tablet Take 81 mg by mouth daily. For prevention of heart attack and stroke      . divalproex (DEPAKOTE ER) 500 MG 24 hr tablet Take 1,000 mg by mouth daily. For mood control      . escitalopram (LEXAPRO) 20 MG tablet Take 20 mg by mouth daily.      . Ibuprofen-Diphenhydramine Cit (ADVIL PM PO) Take 1 tablet by mouth at bedtime.      . metoprolol tartrate (LOPRESSOR) 25 MG tablet Take 75 mg by mouth 2 (two) times daily.      . prasugrel (EFFIENT) 10 MG TABS Take 10 mg by mouth daily. For acute coronary syndrome      . simvastatin (ZOCOR) 40 MG tablet Take 40 mg by mouth every evening. To lower cholesterol        Allergies  Allergen Reactions  . Codeine Nausea  And Vomiting  . Vicodin (Hydrocodone-Acetaminophen) Nausea And Vomiting    Family History  Problem Relation Age of Onset  . Hypertension Mother   . Mental illness Mother     History   Social History  . Marital Status: Single    Spouse Name: N/A    Number of Children: 2  . Years of Education: N/A   Occupational History  . NURSE AIDE    Social History Main Topics  . Smoking status: Current Some Day Smoker -- 1.0 packs/day    Types: Cigarettes  . Smokeless tobacco: Not on file   Comment: 2 cigarettes daily  . Alcohol Use: No     REMOTE USE  . Drug Use: Yes    Special: Benzodiazepines, Opium  . Sexually Active: Yes    Birth Control/ Protection: Surgical   Other Topics Concern  . Not on file   Social History Narrative  . No narrative on file    ROS ALL NEGATIVE EXCEPT THOSE NOTED IN HPI  PE  General Appearance: well developed, well nourished in no acute distress, obese  HEENT: symmetrical face, PERRLA, good dentition  Neck: no JVD, thyromegaly, or adenopathy, trachea midline Chest: symmetric without deformity Cardiac:  PMI non-displaced, RRR, normal S1, S2, no gallop or murmur Lung: clear to ausculation and percussion Vascular: all pulses full without bruits  Abdominal: nondistended, nontender, good bowel sounds, no HSM, no bruits Extremities: no cyanosis, clubbing or edema, no sign of DVT, no varicosities  Skin: normal color, no rashes Neuro: alert and oriented x 3, non-focal Pysch: normal affect  EKG  BMET    Component Value Date/Time   NA 140 04/10/2011 1438   K 3.4* 04/10/2011 1438   CL 106 04/10/2011 1438   CO2 24 04/10/2011 1438   GLUCOSE 113* 04/10/2011 1438   BUN 9 04/10/2011 1438   CREATININE 0.84 04/10/2011 1438   CALCIUM 9.1 04/10/2011 1438   GFRNONAA 83* 04/10/2011 1438   GFRAA >90 04/10/2011 1438    Lipid Panel     Component Value Date/Time   CHOL 157 09/27/2010 0555   TRIG 85 09/27/2010 0555   HDL 44 09/27/2010 0555   CHOLHDL 3.6 09/27/2010 0555   VLDL  17 09/27/2010 0555   LDLCALC 96 09/27/2010 0555    CBC    Component Value Date/Time   WBC 4.3 04/10/2011 1438   RBC 4.29 04/10/2011 1438   HGB 11.6* 04/10/2011 1438   HCT 35.2* 04/10/2011 1438   PLT 211 04/10/2011 1438   MCV 82.1 04/10/2011 1438   MCH 27.0 04/10/2011 1438   MCHC 33.0 04/10/2011 1438   RDW 16.6* 04/10/2011 1438   LYMPHSABS 1.2 04/10/2011 1438   MONOABS 0.4 04/10/2011 1438   EOSABS 0.1 04/10/2011 1438   BASOSABS 0.0 04/10/2011 1438

## 2011-05-31 NOTE — Patient Instructions (Addendum)
Your physician recommends that you have lab work today: CMP, lipid  Your physician has recommended you make the following change in your medication:  Start HCTZ 12.5 mg daily Start Potassium daily  Your physician has requested that you regularly monitor and record your blood pressure readings at home. Please use the same machine at the same time of day to check your readings and record them to bring to your follow-up visit.Goal blood pressure is less than 140/90   Your physician wants you to follow-up in: 6 months with Dr. Daleen Squibb. You will receive a reminder letter in the mail two months in advance. If you don't receive a letter, please call our office to schedule the follow-up appointment.

## 2011-05-31 NOTE — Assessment & Plan Note (Signed)
She has  not eaten since midnight. We'll draw labs today. He promises me she is taking her cholesterol medicine.

## 2011-05-31 NOTE — Assessment & Plan Note (Signed)
Stable. We refilled her medications. Compliance emphasized. Smoke as little as possible or not at all.

## 2011-05-31 NOTE — Assessment & Plan Note (Signed)
Suboptimal control. We'll add HCTZ 12 mg a day and potassium supplementation. Her last potassium was 3.4.

## 2011-06-01 LAB — HEPATIC FUNCTION PANEL
ALT: 12 U/L (ref 0–35)
AST: 18 U/L (ref 0–37)
Albumin: 3.8 g/dL (ref 3.5–5.2)
Alkaline Phosphatase: 46 U/L (ref 39–117)
Bilirubin, Direct: 0 mg/dL (ref 0.0–0.3)
Total Bilirubin: 0.1 mg/dL — ABNORMAL LOW (ref 0.3–1.2)
Total Protein: 7.1 g/dL (ref 6.0–8.3)

## 2011-06-01 LAB — BASIC METABOLIC PANEL
BUN: 7 mg/dL (ref 6–23)
CO2: 26 mEq/L (ref 19–32)
Calcium: 8.8 mg/dL (ref 8.4–10.5)
Chloride: 106 mEq/L (ref 96–112)
Creatinine, Ser: 0.8 mg/dL (ref 0.4–1.2)
GFR: 102.67 mL/min (ref >60.00)
Glucose, Bld: 93 mg/dL (ref 70–99)
Potassium: 4.8 mEq/L (ref 3.5–5.1)
Sodium: 136 mEq/L (ref 135–145)

## 2011-06-01 LAB — LIPID PANEL
Cholesterol: 134 mg/dL (ref 0–200)
HDL: 45.1 mg/dL (ref >39.00)
LDL Cholesterol: 71 mg/dL (ref 0–99)
Total CHOL/HDL Ratio: 3
Triglycerides: 92 mg/dL (ref 0.0–149.0)
VLDL: 18.4 mg/dL (ref 0.0–40.0)

## 2011-07-15 DIAGNOSIS — R079 Chest pain, unspecified: Secondary | ICD-10-CM

## 2011-11-24 ENCOUNTER — Encounter: Payer: Self-pay | Admitting: Physician Assistant

## 2011-11-24 ENCOUNTER — Ambulatory Visit (INDEPENDENT_AMBULATORY_CARE_PROVIDER_SITE_OTHER): Payer: Self-pay | Admitting: Physician Assistant

## 2011-11-24 VITALS — BP 160/90 | HR 80 | Ht 61.0 in | Wt 175.0 lb

## 2011-11-24 DIAGNOSIS — R600 Localized edema: Secondary | ICD-10-CM

## 2011-11-24 DIAGNOSIS — E785 Hyperlipidemia, unspecified: Secondary | ICD-10-CM

## 2011-11-24 DIAGNOSIS — F17200 Nicotine dependence, unspecified, uncomplicated: Secondary | ICD-10-CM

## 2011-11-24 DIAGNOSIS — I2510 Atherosclerotic heart disease of native coronary artery without angina pectoris: Secondary | ICD-10-CM

## 2011-11-24 DIAGNOSIS — I10 Essential (primary) hypertension: Secondary | ICD-10-CM

## 2011-11-24 DIAGNOSIS — R609 Edema, unspecified: Secondary | ICD-10-CM

## 2011-11-24 DIAGNOSIS — Z720 Tobacco use: Secondary | ICD-10-CM

## 2011-11-24 HISTORY — DX: Localized edema: R60.0

## 2011-11-24 MED ORDER — HYDROCHLOROTHIAZIDE 12.5 MG PO CAPS: 12.5000 mg | ORAL_CAPSULE | Freq: Every day | ORAL | Status: DC

## 2011-11-24 NOTE — Assessment & Plan Note (Signed)
Patient continues to smoke cigarettes. I discussed the importance of smoking cessation.

## 2011-11-24 NOTE — Assessment & Plan Note (Signed)
Patient's blood pressure is elevated due to noncompliance with her medications and excessive sodium intake. I had a long discussion with her concerning diet, exercise and importance of taking her medications as prescribed.

## 2011-11-24 NOTE — Progress Notes (Signed)
HPI:  This is a 46 year old female patient who suffered a non-ST elevation MI on 09/2010 treated with drug-eluting stent to the circumflex and RCA. Ejection fraction 60% 2-D echo ejection fraction 55-60% with moderate LVH.She has a history of noncompliance and polysubstance abuse. She was last seen by Dr. Juanito Doom 05/2011.  She comes in today complaining of swelling of her ankles and numbness and tingling in her hands and feet. She ran out of her hydrochlorothiazide several months ago and never called for refill. 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 She continues to smoke cigarettes or refuses to exercise. She also lost several sample  bottles of her Effient so was off of them for 2 weeks before she found the medication restarted on. We did refill her hydrochlorothiazide back in April so her not sure why she stopped it 2 months ago.  Patient denies chest pain, palpitations, dyspnea, dyspnea on exertion, dizziness, or presyncope.   Allergies: -- Codeine -- Nausea And Vomiting  -- Vicodin (Hydrocodone-Acetaminophen) -- Nausea And Vomiting  Current Outpatient Prescriptions on File Prior to Visit: aspirin EC 81 MG tablet, Take 81 mg by mouth daily. For prevention of heart attack and stroke, Disp: , Rfl:  divalproex (DEPAKOTE ER) 500 MG 24 hr tablet, Take 1,000 mg by mouth daily. For mood control, Disp: , Rfl:  escitalopram (LEXAPRO) 20 MG tablet, Take 20 mg by mouth daily., Disp: , Rfl:  hydrochlorothiazide (MICROZIDE) 12.5 MG capsule, Take 1 capsule (12.5 mg total) by mouth daily., Disp: 30 capsule, Rfl: 11 Ibuprofen-Diphenhydramine Cit (ADVIL PM PO), Take 1 tablet by mouth at bedtime., Disp: , Rfl:  metoprolol tartrate (LOPRESSOR) 25 MG tablet, Take 3 tablets (75 mg total) by mouth 2 (two) times daily., Disp: 180 tablet, Rfl: 11 potassium chloride (K-DUR) 10 MEQ tablet, Take 2 tablets (20 mEq total) by mouth 2 (two) times daily., Disp: 60 tablet, Rfl: 6 prasugrel  (EFFIENT) 10 MG TABS, Take 1 tablet (10 mg total) by mouth daily. For acute coronary syndrome, Disp: 30 tablet, Rfl: 11 simvastatin (ZOCOR) 40 MG tablet, Take 1 tablet (40 mg total) by mouth every evening. To lower cholesterol, Disp: 30 tablet, Rfl: 11    Past Medical History:   CAD (coronary artery disease)                                  Comment:a. NSTEMI 8/12 tx with DES to [facility] and DES to               pRCA;  Cardiac cath 09/29/10: oLAD 25%, m-dLAD               25-30%, mLAD 40%, D1 30%, mCFX 99%, pOM1 25%,               pRCA 80% and 70%, oPDA 25%, EF 60%;  echo 8/12:              EF 55-60%, mod LVH   Hypertension                                                 Bipolar disorder  GERD (gastroesophageal reflux disease)                       Dyslipidemia                                                 Tobacco abuse                                                History of alcohol abuse                                     Myocardial infarction                                          Comment:Sep 28, 2010  Past Surgical History:   CARDIAC CATHETERIZATION                                     Review of patient's family history indicates:   Hypertension                   Mother                   Mental illness                 Mother                   Social History   Marital Status: Single              Spouse Name:                      Years of Education:                 Number of children: 2           Occupational History Occupation          Pension scheme manager AIDE                                Social History Main Topics   Smoking Status: Current Some Day Smoker         Packs/Day: 1     Years:           Types: Cigarettes   Smokeless Status: Not on file                      Comment: 2 cigarettes daily   Alcohol Use: No                Comment: REMOTE USE   Drug Use: Yes               Special:  Benzodiazepines,  Opium   Sexual Activity: Yes                    Birth Control/Protection: Surgical  Other Topics            Concern   None on file  Social History Narrative   None on file    ROS:see history of present illness   PHYSICAL EXAM: Well-nournished, in no acute distress. Neck: No JVD, HJR, Bruit, or thyroid enlargement  Lungs: No tachypnea, clear without wheezing, rales, or rhonchi  Cardiovascular: RRR, PMI not displaced,positive S4 and 2/6 systolic murmur at the left sternal border, no bruit, thrill, or heave.  Abdomen: BS normal. Soft without organomegaly, masses, lesions or tenderness.  Extremities: trace of edema bilaterally,without cyanosis, clubbing. Good distal pulses bilateral  SKin: Warm, no lesions or rashes   Musculoskeletal: No deformities  Neuro: no focal signs  BP 160/90  Pulse 80  Ht 5\' 1"  (1.549 m)  Wt 175 lb (79.379 kg)  BMI 33.07 kg/m2 .    09/29/10: Coronaries:  Left main was normal.  The LAD had ostial 25% stenosis due to diffuse luminal irregularities.  There were diffuse 25-30% lesions in the mid and distal vessels.  There was a mid focal 40% lesion right after the takeoff of first diagonal.  First diagonal was small diffuse 30% stenosis.  The circumflex had a mid 99% stenosis with TIMI 2 flow after the first obtuse marginal.  There was some distal moderate disease before small posterolateral as well.  First obtuse marginal was large with proximal 25% stenosis.  Second obtuse marginal was moderate size without high-grade disease.  Posterolateral was small and free of high- grade disease.  The right coronary artery is dominant vessel.  There was proximal 80% stenosis.  There was a tandem 70% lesion.  There were diffuse 25% lesions.  PDA was large with ostial 25% stenosis. Left ventriculogram:  Left ventriculogram was obtained in the RAO projection.  EF was 60%.   CONCLUSION:  Severe two-vessel coronary artery disease.  Diffuse  plaque very advanced for her age.  She does have preserved ejection fraction.   IMPRESSION: 1. Successful percutaneous transluminal coronary angioplasty with     placement of a drug-eluting stent in the mid circumflex artery. 2. Successful percutaneous transluminal coronary angioplasty with     placement of a drug-eluting stent in the proximal right coronary     artery.  2Decho 09/29/10: Left ventricle: There is some hypokinsesis at the base of the inferolateral wall. Other walls move well. The EF is 55-60%. Wall thickness was increased in a pattern of moderate LVH.

## 2011-11-24 NOTE — Assessment & Plan Note (Signed)
Follow up in April 2014   

## 2011-11-24 NOTE — Assessment & Plan Note (Signed)
Currently stable but needs major risk reduction including hypertensive control, diet, and exercise. She is also smoking and has been advised to quit.

## 2011-11-24 NOTE — Assessment & Plan Note (Signed)
Patient has swelling in both legs probably due to her hypertension, excessive sodium intake and stopping her diuretic 2 months ago. We will resume her hydrochlorothiazide. I will check a BM ET. We discussed discussed the importance of a 2 g sodium diet.

## 2011-11-24 NOTE — Patient Instructions (Signed)
Your physician recommends that you schedule a follow-up appointment in: 2 months with Dr Daleen Squibb    2 Gram Low Sodium Diet A 2 gram sodium diet restricts the amount of sodium in the diet to no more than 2 g or 2000 mg daily. Limiting the amount of sodium is often used to help lower blood pressure. It is important if you have heart, liver, or kidney problems. Many foods contain sodium for flavor and sometimes as a preservative. When the amount of sodium in a diet needs to be low, it is important to know what to look for when choosing foods and drinks. The following includes some information and guidelines to help make it easier for you to adapt to a low sodium diet. QUICK TIPS  Do not add salt to food.  Avoid convenience items and fast food.  Choose unsalted snack foods.  Buy lower sodium products, often labeled as "lower sodium" or "no salt added."  Check food labels to learn how much sodium is in 1 serving.  When eating at a restaurant, ask that your food be prepared with less salt or none, if possible. READING FOOD LABELS FOR SODIUM INFORMATION The nutrition facts label is a good place to find how much sodium is in foods. Look for products with no more than 500 to 600 mg of sodium per meal and no more than 150 mg per serving. Remember that 2 g = 2000 mg. The food label may also list foods as:  Sodium-free: Less than 5 mg in a serving.  Very low sodium: 35 mg or less in a serving.  Low-sodium: 140 mg or less in a serving.  Light in sodium: 50% less sodium in a serving. For example, if a food that usually has 300 mg of sodium is changed to become light in sodium, it will have 150 mg of sodium.  Reduced sodium: 25% less sodium in a serving. For example, if a food that usually has 400 mg of sodium is changed to reduced sodium, it will have 300 mg of sodium. CHOOSING FOODS Grains  Avoid: Salted crackers and snack items. Some cereals, including instant hot cereals. Bread stuffing and  biscuit mixes. Seasoned rice or pasta mixes.  Choose: Unsalted snack items. Low-sodium cereals, oats, puffed wheat and rice, shredded wheat. English muffins and bread. Pasta. Meats  Avoid: Salted, canned, smoked, spiced, pickled meats, including fish and poultry. Bacon, ham, sausage, cold cuts, hot dogs, anchovies.  Choose: Low-sodium canned tuna and salmon. Fresh or frozen meat, poultry, and fish. Dairy  Avoid: Processed cheese and spreads. Cottage cheese. Buttermilk and condensed milk. Regular cheese.  Choose: Milk. Low-sodium cottage cheese. Yogurt. Sour cream. Low-sodium cheese. Fruits and Vegetables  Avoid: Regular canned vegetables. Regular canned tomato sauce and paste. Frozen vegetables in sauces. Olives. Rosita Fire. Relishes. Sauerkraut.  Choose: Low-sodium canned vegetables. Low-sodium tomato sauce and paste. Frozen or fresh vegetables. Fresh and frozen fruit. Condiments  Avoid: Canned and packaged gravies. Worcestershire sauce. Tartar sauce. Barbecue sauce. Soy sauce. Steak sauce. Ketchup. Onion, garlic, and table salt. Meat flavorings and tenderizers.  Choose: Fresh and dried herbs and spices. Low-sodium varieties of mustard and ketchup. Lemon juice. Tabasco sauce. Horseradish. SAMPLE 2 GRAM SODIUM MEAL PLAN Breakfast / Sodium (mg)  1 cup low-fat milk / 143 mg  2 slices whole-wheat toast / 270 mg  1 tbs heart-healthy margarine / 153 mg  1 hard-boiled egg / 139 mg  1 small orange / 0 mg Lunch / Sodium (mg)  1  cup raw carrots / 76 mg   cup hummus / 298 mg  1 cup low-fat milk / 143 mg   cup red grapes / 2 mg  1 whole-wheat pita bread / 356 mg Dinner / Sodium (mg)  1 cup whole-wheat pasta / 2 mg  1 cup low-sodium tomato sauce / 73 mg  3 oz lean ground beef / 57 mg  1 small side salad (1 cup raw spinach leaves,  cup cucumber,  cup yellow bell pepper) with 1 tsp olive oil and 1 tsp red wine vinegar / 25 mg Snack / Sodium (mg)  1 container low-fat  vanilla yogurt / 107 mg  3 graham cracker squares / 127 mg Nutrient Analysis  Calories: 2033  Protein: 77 g  Carbohydrate: 282 g  Fat: 72 g  Sodium: 1971 mg Document Released: 01/25/2005 Document Revised: 04/19/2011 Document Reviewed: 04/28/2009 [facility] Patient Information 2013 [place], [place].

## 2012-02-16 ENCOUNTER — Ambulatory Visit: Payer: Self-pay | Admitting: Cardiology

## 2012-02-23 ENCOUNTER — Encounter: Payer: Self-pay | Admitting: Cardiology

## 2012-03-17 ENCOUNTER — Emergency Department (HOSPITAL_COMMUNITY): Payer: Self-pay

## 2012-03-17 ENCOUNTER — Encounter (HOSPITAL_COMMUNITY): Payer: Self-pay | Admitting: *Deleted

## 2012-03-17 ENCOUNTER — Emergency Department (HOSPITAL_COMMUNITY)
Admission: EM | Admit: 2012-03-17 | Discharge: 2012-03-17 | Disposition: A | Discharge status: Home / Self Care | Payer: Self-pay | Attending: Emergency Medicine | Admitting: Emergency Medicine

## 2012-03-17 DIAGNOSIS — I2510 Atherosclerotic heart disease of native coronary artery without angina pectoris: Secondary | ICD-10-CM | POA: Insufficient documentation

## 2012-03-17 DIAGNOSIS — R079 Chest pain, unspecified: Secondary | ICD-10-CM | POA: Insufficient documentation

## 2012-03-17 DIAGNOSIS — R609 Edema, unspecified: Secondary | ICD-10-CM | POA: Insufficient documentation

## 2012-03-17 DIAGNOSIS — Z91199 Patient's noncompliance with other medical treatment and regimen due to unspecified reason: Secondary | ICD-10-CM | POA: Insufficient documentation

## 2012-03-17 DIAGNOSIS — R0602 Shortness of breath: Secondary | ICD-10-CM | POA: Insufficient documentation

## 2012-03-17 DIAGNOSIS — F17200 Nicotine dependence, unspecified, uncomplicated: Secondary | ICD-10-CM | POA: Insufficient documentation

## 2012-03-17 DIAGNOSIS — Z79899 Other long term (current) drug therapy: Secondary | ICD-10-CM | POA: Insufficient documentation

## 2012-03-17 DIAGNOSIS — F319 Bipolar disorder, unspecified: Secondary | ICD-10-CM | POA: Insufficient documentation

## 2012-03-17 DIAGNOSIS — I252 Old myocardial infarction: Secondary | ICD-10-CM | POA: Insufficient documentation

## 2012-03-17 DIAGNOSIS — I10 Essential (primary) hypertension: Secondary | ICD-10-CM | POA: Insufficient documentation

## 2012-03-17 DIAGNOSIS — F1010 Alcohol abuse, uncomplicated: Secondary | ICD-10-CM | POA: Insufficient documentation

## 2012-03-17 DIAGNOSIS — Z7982 Long term (current) use of aspirin: Secondary | ICD-10-CM | POA: Insufficient documentation

## 2012-03-17 DIAGNOSIS — E785 Hyperlipidemia, unspecified: Secondary | ICD-10-CM | POA: Insufficient documentation

## 2012-03-17 DIAGNOSIS — Z8719 Personal history of other diseases of the digestive system: Secondary | ICD-10-CM | POA: Insufficient documentation

## 2012-03-17 DIAGNOSIS — Z9119 Patient's noncompliance with other medical treatment and regimen: Secondary | ICD-10-CM | POA: Insufficient documentation

## 2012-03-17 LAB — COMPREHENSIVE METABOLIC PANEL
ALT: 8 U/L (ref 0–35)
AST: 15 U/L (ref 0–37)
Albumin: 3.2 g/dL — ABNORMAL LOW (ref 3.5–5.2)
Alkaline Phosphatase: 42 U/L (ref 39–117)
BUN: 8 mg/dL (ref 6–23)
CO2: 21 mEq/L (ref 19–32)
Calcium: 9.1 mg/dL (ref 8.4–10.5)
Chloride: 104 mEq/L (ref 96–112)
Creatinine, Ser: 0.7 mg/dL (ref 0.50–1.10)
GFR calc Af Amer: >90 mL/min (ref >90)
GFR calc non Af Amer: >90 mL/min (ref >90)
Glucose, Bld: 100 mg/dL — ABNORMAL HIGH (ref 70–99)
Potassium: 4 mEq/L (ref 3.5–5.1)
Sodium: 136 mEq/L (ref 135–145)
Total Bilirubin: 0.2 mg/dL — ABNORMAL LOW (ref 0.3–1.2)
Total Protein: 6.8 g/dL (ref 6.0–8.3)

## 2012-03-17 LAB — CBC
HCT: 37.2 % (ref 36.0–46.0)
Hemoglobin: 12.1 g/dL (ref 12.0–15.0)
MCH: 27.3 pg (ref 26.0–34.0)
MCHC: 32.5 g/dL (ref 30.0–36.0)
MCV: 83.8 fL (ref 78.0–100.0)
Platelets: 295 10*3/uL (ref 150–400)
RBC: 4.44 MIL/uL (ref 3.87–5.11)
RDW: 17.2 % — ABNORMAL HIGH (ref 11.5–15.5)
WBC: 4.8 10*3/uL (ref 4.0–10.5)

## 2012-03-17 LAB — D-DIMER, QUANTITATIVE: D-Dimer, Quant: 0.39 ug/mL-FEU (ref 0.00–0.48)

## 2012-03-17 LAB — POCT I-STAT TROPONIN I: Troponin i, poc: 0 ng/mL (ref 0.00–0.08)

## 2012-03-17 LAB — PRO B NATRIURETIC PEPTIDE: Pro B Natriuretic peptide (BNP): 195.9 pg/mL — ABNORMAL HIGH (ref 0–125)

## 2012-03-17 MED ORDER — PRASUGREL HCL 10 MG PO TABS: 10.0000 mg | ORAL_TABLET | Freq: Every day | ORAL | Status: DC

## 2012-03-17 MED ORDER — KETOROLAC TROMETHAMINE 30 MG/ML IJ SOLN
30.0000 mg | Freq: Once | INTRAMUSCULAR | Status: AC
  Administered 2012-03-17: 30 mg via INTRAVENOUS
  Filled 2012-03-17: qty 1

## 2012-03-17 NOTE — ED Notes (Signed)
Pt. C/o dyspnea with exertion, fatigue, swelling in bilateral lower extremities, numbness and tingling in bilateral hands that occurs upon getting off of bed. Pt also c/o intermittent headache x1 week

## 2012-03-17 NOTE — ED Notes (Signed)
Dr.Lockwood at bedside  

## 2012-03-17 NOTE — ED Provider Notes (Signed)
History     CSN: [id number]  Arrival date & time 03/17/12  1347   First MD Initiated Contact with Patient 03/17/12 1616      Chief Complaint  Patient presents with  . Chest Pain    (Consider location/radiation/quality/duration/timing/severity/associated sxs/prior treatment) HPI  The patient presents with concern of fatigue, dyspnea on exertion, new edema.  She states that approximately one week ago she stopped taking her prasugrel.  In the days following, she gradually developed symmetric bilateral, nontender lower extremity edema, increasing fatigue, increasing his by mouth on exertion, increasing fatigability. She denies chest pain, syncope, near-syncope, visual changes, confusion, disorientation, dysuria. She denies any history of lower extremity edema, states that she has no history of heart failure. She states that the symptoms are not the same as those she experienced prior to a heart attack 2 years ago  Past Medical History  Diagnosis Date  . CAD (coronary artery disease)     a. NSTEMI 8/12 tx with DES to [facility] and DES to pRCA;  Cardiac cath 09/29/10: oLAD 25%, m-dLAD 25-30%, mLAD 40%, D1 30%, mCFX 99%, pOM1 25%, pRCA 80% and 70%, oPDA 25%, EF 60%;  echo 8/12: EF 55-60%, mod LVH  . Hypertension   . Bipolar disorder   . GERD (gastroesophageal reflux disease)   . Dyslipidemia   . Tobacco abuse   . History of alcohol abuse   . Myocardial infarction     Sep 28, 2010    Past Surgical History  Procedure Date  . Cardiac catheterization   . Coronary angioplasty with stent placement     Family History  Problem Relation Age of Onset  . Hypertension Mother   . Mental illness Mother     History  Substance Use Topics  . Smoking status: Current Some Day Smoker -- 1.0 packs/day    Types: Cigarettes  . Smokeless tobacco: Not on file     Comment: 2 cigarettes daily  . Alcohol Use: No     Comment: REMOTE USE    OB History    Grav Para Term Preterm Abortions TAB SAB Ect  Mult Living                  Review of Systems  Constitutional:       Per HPI, otherwise negative  HENT:       Per HPI, otherwise negative  Eyes: Negative.   Respiratory:       Per HPI, otherwise negative  Cardiovascular:       Per HPI, otherwise negative  Gastrointestinal: Negative for vomiting.  Genitourinary: Negative.   Musculoskeletal:       Per HPI, otherwise negative  Skin: Negative.   Neurological: Negative for syncope.    Allergies  Codeine and Vicodin  Home Medications   Current Outpatient Rx  Name  Route  Sig  Dispense  Refill  . ASPIRIN EC 81 MG PO TBEC   Oral   Take 81 mg by mouth daily. For prevention of heart attack and stroke         . DIVALPROEX SODIUM ER 500 MG PO TB24   Oral   Take 1,000 mg by mouth daily. For mood control         . ESCITALOPRAM OXALATE 20 MG PO TABS   Oral   Take 20 mg by mouth daily.         Marland Kitchen HYDROCHLOROTHIAZIDE 12.5 MG PO CAPS   Oral   Take 1 capsule (12.5 mg total) by  mouth daily.   30 capsule   11   . ADVIL PM PO   Oral   Take 1 tablet by mouth at bedtime.         Marland Kitchen METOPROLOL TARTRATE 25 MG PO TABS   Oral   Take 3 tablets (75 mg total) by mouth 2 (two) times daily.   180 tablet   11   . POTASSIUM CHLORIDE ER 10 MEQ PO TBCR   Oral   Take 2 tablets (20 mEq total) by mouth 2 (two) times daily.   60 tablet   6   . PRASUGREL HCL 10 MG PO TABS   Oral   Take 1 tablet (10 mg total) by mouth daily. For acute coronary syndrome   30 tablet   11   . SIMVASTATIN 40 MG PO TABS   Oral   Take 1 tablet (40 mg total) by mouth every evening. To lower cholesterol   30 tablet   11     BP 159/78  Pulse 56  Temp 97.3 F (36.3 C) (Oral)  Resp 15  SpO2 100%  LMP 03/17/2012  Physical Exam  Nursing note and vitals reviewed. Constitutional: She is oriented to person, place, and time. She appears well-developed and well-nourished. No distress.  HENT:  Head: Normocephalic and atraumatic.  Eyes:  Conjunctivae normal and EOM are normal.  Cardiovascular: Normal rate and regular rhythm.   Pulmonary/Chest: Effort normal and breath sounds normal. No stridor. No respiratory distress.  Abdominal: She exhibits no distension.  Musculoskeletal: She exhibits no edema.       Symmetric bilateral lower extremity edema, nonpitting. Range of motion is appropriate, strength is appropriate 5/5 throughout the knees and ankles  Neurological: She is alert and oriented to person, place, and time. No cranial nerve deficit.  Skin: Skin is warm and dry.  Psychiatric: She has a normal mood and affect.    ED Course  Procedures (including critical care time)  Labs Reviewed  PRO B NATRIURETIC PEPTIDE - Abnormal; Notable for the following:    Pro B Natriuretic peptide (BNP) 195.9 (*)     All other components within normal limits  CBC - Abnormal; Notable for the following:    RDW 17.2 (*)     All other components within normal limits  COMPREHENSIVE METABOLIC PANEL - Abnormal; Notable for the following:    Glucose, Bld 100 (*)     Albumin 3.2 (*)     Total Bilirubin 0.2 (*)     All other components within normal limits  POCT I-STAT TROPONIN I  D-DIMER, QUANTITATIVE   Dg Chest 2 View  03/17/2012  *RADIOLOGY REPORT*  Clinical Data: Chest pain.  Myocardial infarction.  CHEST - 2 VIEW  Comparison: 07/15/2011 and 01/24/2011  Findings: The heart size and pulmonary vascularity are normal and the lungs are clear.  Coronary artery stent in place.  No effusion. No osseous abnormality.  IMPRESSION: No acute disease.   Original Report Authenticated By: Francene Boyers, M.D.      No diagnosis found.   Date: 03/17/2012  Rate: 59  Rhythm: sinus bradycardia  QRS Axis: normal  Intervals: normal  ST/T Wave abnormalities: nonspecific T wave changes  Conduction Disutrbances:none  Narrative Interpretation:   Old EKG Reviewed: none available  ABNORMAL   cardiac: 65 sr, normal  O2- 99%ra, normal  Update: I spoke  with the patient's cardiologist, reviewed all labs.  The patient's cardiologist sig the patient should continue taking all medication as directed, and can  follow up in the office.  On repeat exam the patient appears calm states that she is ready to go home. MDM  This female with history of CAD, medication noncompliance presents with ongoing chest pain, headache.  The patient's evaluation is largely unremarkable, with nonischemic EKG, negative troponin, which given the passage of time since onset her symptoms would likely reflect the absence of ongoing coronary ischemia.  The patient also in no distress, with stable vital signs.  After discussion with the patient's cardiologist, she was discharged with several days of her medication, to bridge her until she can obtain samples from her physician       Gerhard Munch, MD 03/17/12 1843

## 2012-03-17 NOTE — ED Notes (Addendum)
Pt to ED c/o swelling to hands and feet on Thursday.  Pt began experiencing dizziness and tingling to hands, L shoulder pain and sternal chest last night.  Denies sob, but c/o nausea. States does not feel like the last time she had an MI.  Ran out of asa and has not taken for a few days.

## 2012-03-29 ENCOUNTER — Telehealth: Payer: Self-pay | Admitting: *Deleted

## 2012-03-29 NOTE — Telephone Encounter (Signed)
A walk in-patient requesting Effient 10 mg samples. Patient states has been without this medication for 2 weeks, because she can't afford this medication . A 10 mg/35 sample pills given, and a one time free sample card. Patient states will apply for medicaid so she is able to  get her  Medications.

## 2012-06-30 ENCOUNTER — Ambulatory Visit (INDEPENDENT_AMBULATORY_CARE_PROVIDER_SITE_OTHER): Payer: Self-pay | Admitting: Cardiology

## 2012-06-30 ENCOUNTER — Encounter: Payer: Self-pay | Admitting: Cardiology

## 2012-06-30 VITALS — BP 152/82 | HR 84 | Ht 61.0 in | Wt 178.8 lb

## 2012-06-30 DIAGNOSIS — Z9119 Patient's noncompliance with other medical treatment and regimen: Secondary | ICD-10-CM

## 2012-06-30 DIAGNOSIS — F3130 Bipolar disorder, current episode depressed, mild or moderate severity, unspecified: Secondary | ICD-10-CM

## 2012-06-30 DIAGNOSIS — Z9114 Patient's other noncompliance with medication regimen: Secondary | ICD-10-CM | POA: Insufficient documentation

## 2012-06-30 DIAGNOSIS — I2510 Atherosclerotic heart disease of native coronary artery without angina pectoris: Secondary | ICD-10-CM

## 2012-06-30 DIAGNOSIS — Z720 Tobacco use: Secondary | ICD-10-CM

## 2012-06-30 DIAGNOSIS — F17200 Nicotine dependence, unspecified, uncomplicated: Secondary | ICD-10-CM

## 2012-06-30 DIAGNOSIS — I10 Essential (primary) hypertension: Secondary | ICD-10-CM

## 2012-06-30 DIAGNOSIS — Z91148 Patient's other noncompliance with medication regimen for other reason: Secondary | ICD-10-CM

## 2012-06-30 MED ORDER — METOPROLOL TARTRATE 25 MG PO TABS: 50.0000 mg | ORAL_TABLET | Freq: Two times a day (BID) | ORAL | Status: DC

## 2012-06-30 MED ORDER — HYDROCHLOROTHIAZIDE 12.5 MG PO CAPS: 12.5000 mg | ORAL_CAPSULE | Freq: Every day | ORAL | Status: DC

## 2012-06-30 MED ORDER — SIMVASTATIN 40 MG PO TABS: 40.0000 mg | ORAL_TABLET | Freq: Every evening | ORAL | Status: DC

## 2012-06-30 NOTE — Patient Instructions (Addendum)
Your physician has recommended you make the following change in your medication: Decrease Metoprolol to 50mg  twice a day  Stop taking potassium  Instead follow a potassium rich diet   Your physician wants you to follow-up in: 1 year. You will receive a reminder letter in the mail two months in advance. If you don't receive a letter, please call our office to schedule the follow-up appointment.

## 2012-06-30 NOTE — Assessment & Plan Note (Signed)
Advised to stop completely.

## 2012-06-30 NOTE — Progress Notes (Signed)
HPI  [name] returns today for evaluation and management coronary artery disease and multiple risk factors. She's had major problems with noncompliance and out of her medications 2 weeks ago. She did not refill them  because she lost her Medicaid.  She denies any angina or chest discomfort. She has intermittent tingling in the hands of both of her hands and fingers. No other neurological symptoms. She  denies any drug abuse. Is not clear if she still smoking or not. Past Medical History  Diagnosis Date  . CAD (coronary artery disease)     a. NSTEMI 8/12 tx with DES to [facility] and DES to pRCA;  Cardiac cath 09/29/10: oLAD 25%, m-dLAD 25-30%, mLAD 40%, D1 30%, mCFX 99%, pOM1 25%, pRCA 80% and 70%, oPDA 25%, EF 60%;  echo 8/12: EF 55-60%, mod LVH  . Hypertension   . Bipolar disorder   . GERD (gastroesophageal reflux disease)   . Dyslipidemia   . Tobacco abuse   . History of alcohol abuse   . Myocardial infarction     Sep 28, 2010    Current Outpatient Prescriptions  Medication Sig Dispense Refill  . aspirin EC 81 MG tablet Take 81 mg by mouth daily. For prevention of heart attack and stroke      . divalproex (DEPAKOTE ER) 500 MG 24 hr tablet Take 1,000 mg by mouth daily. For mood control      . escitalopram (LEXAPRO) 20 MG tablet Take 20 mg by mouth daily.      . hydrochlorothiazide (MICROZIDE) 12.5 MG capsule Take 1 capsule (12.5 mg total) by mouth daily.  30 capsule  11  . metoprolol tartrate (LOPRESSOR) 25 MG tablet Take 3 tablets (75 mg total) by mouth 2 (two) times daily.  180 tablet  11  . potassium chloride (K-DUR) 10 MEQ tablet Take 2 tablets (20 mEq total) by mouth 2 (two) times daily.  60 tablet  6  . prasugrel (EFFIENT) 10 MG TABS Take 1 tablet (10 mg total) by mouth daily. For acute coronary syndrome  3 tablet  0  . simvastatin (ZOCOR) 40 MG tablet Take 1 tablet (40 mg total) by mouth every evening. To lower cholesterol  30 tablet  11   No current facility-administered  medications for this visit.    Allergies  Allergen Reactions  . Codeine Nausea And Vomiting  . Vicodin (Hydrocodone-Acetaminophen) Nausea And Vomiting    Family History  Problem Relation Age of Onset  . Hypertension Mother   . Mental illness Mother     History   Social History  . Marital Status: Single    Spouse Name: N/A    Number of Children: 2  . Years of Education: N/A   Occupational History  . NURSE AIDE    Social History Main Topics  . Smoking status: Current Some Day Smoker -- 1.00 packs/day    Types: Cigarettes  . Smokeless tobacco: Not on file     Comment: 2 cigarettes daily  . Alcohol Use: No     Comment: REMOTE USE  . Drug Use: Yes    Special: Benzodiazepines, Opium  . Sexually Active: Yes    Birth Control/ Protection: Surgical   Other Topics Concern  . Not on file   Social History Narrative  . No narrative on file    ROS ALL NEGATIVE EXCEPT THOSE NOTED IN HPI  PE  General Appearance: well developed, well nourished in no acute distress, obese HEENT: symmetrical face, PERRLA, good dentition  Neck:  no JVD, thyromegaly, or adenopathy, trachea midline Chest: symmetric without deformity Cardiac: PMI non-displaced, RRR, normal S1, S2, no gallop or murmur Lung: clear to ausculation and percussion Vascular: all pulses full without bruits  Abdominal: nondistended, nontender, good bowel sounds, no HSM, no bruits Extremities: no cyanosis, clubbing or edema, no sign of DVT, no varicosities  Skin: normal color, no rashes Neuro: alert and oriented x 3, non-focal Pysch: normal affect  EKG Not repeated BMET    Component Value Date/Time   NA 136 03/17/2012 1408   K 4.0 03/17/2012 1408   CL 104 03/17/2012 1408   CO2 21 03/17/2012 1408   GLUCOSE 100* 03/17/2012 1408   BUN 8 03/17/2012 1408   CREATININE 0.70 03/17/2012 1408   CALCIUM 9.1 03/17/2012 1408   GFRNONAA >90 03/17/2012 1408   GFRAA >90 03/17/2012 1408    Lipid Panel     Component Value Date/Time   CHOL  134 05/31/2011 1140   TRIG 92.0 05/31/2011 1140   HDL 45.10 05/31/2011 1140   CHOLHDL 3 05/31/2011 1140   VLDL 18.4 05/31/2011 1140   LDLCALC 71 05/31/2011 1140    CBC    Component Value Date/Time   WBC 4.8 03/17/2012 1408   RBC 4.44 03/17/2012 1408   HGB 12.1 03/17/2012 1408   HCT 37.2 03/17/2012 1408   PLT 295 03/17/2012 1408   MCV 83.8 03/17/2012 1408   MCH 27.3 03/17/2012 1408   MCHC 32.5 03/17/2012 1408   RDW 17.2* 03/17/2012 1408   LYMPHSABS 1.2 04/10/2011 1438   MONOABS 0.4 04/10/2011 1438   EOSABS 0.1 04/10/2011 1438   BASOSABS 0.0 04/10/2011 1438

## 2012-06-30 NOTE — Assessment & Plan Note (Signed)
Stable. We'll renew her aspirin, metoprolol, and simvastatin. I have stopped her Effient.

## 2012-06-30 NOTE — Assessment & Plan Note (Signed)
Poor control secondary to noncompliance with meds. Medications renewed.

## 2012-11-29 ENCOUNTER — Encounter (HOSPITAL_COMMUNITY): Payer: Self-pay | Admitting: Emergency Medicine

## 2012-11-29 ENCOUNTER — Emergency Department (HOSPITAL_COMMUNITY): Payer: Self-pay

## 2012-11-29 ENCOUNTER — Emergency Department (HOSPITAL_COMMUNITY)
Admission: EM | Admit: 2012-11-29 | Discharge: 2012-11-29 | Disposition: A | Discharge status: Home / Self Care | Payer: Self-pay | Attending: Emergency Medicine | Admitting: Emergency Medicine

## 2012-11-29 DIAGNOSIS — R079 Chest pain, unspecified: Secondary | ICD-10-CM | POA: Insufficient documentation

## 2012-11-29 DIAGNOSIS — Z8639 Personal history of other endocrine, nutritional and metabolic disease: Secondary | ICD-10-CM | POA: Insufficient documentation

## 2012-11-29 DIAGNOSIS — M25552 Pain in left hip: Secondary | ICD-10-CM

## 2012-11-29 DIAGNOSIS — I252 Old myocardial infarction: Secondary | ICD-10-CM | POA: Insufficient documentation

## 2012-11-29 DIAGNOSIS — I2510 Atherosclerotic heart disease of native coronary artery without angina pectoris: Secondary | ICD-10-CM | POA: Insufficient documentation

## 2012-11-29 DIAGNOSIS — Z8719 Personal history of other diseases of the digestive system: Secondary | ICD-10-CM | POA: Insufficient documentation

## 2012-11-29 DIAGNOSIS — Z79899 Other long term (current) drug therapy: Secondary | ICD-10-CM | POA: Insufficient documentation

## 2012-11-29 DIAGNOSIS — M25559 Pain in unspecified hip: Secondary | ICD-10-CM | POA: Insufficient documentation

## 2012-11-29 DIAGNOSIS — F17200 Nicotine dependence, unspecified, uncomplicated: Secondary | ICD-10-CM | POA: Insufficient documentation

## 2012-11-29 DIAGNOSIS — Z7982 Long term (current) use of aspirin: Secondary | ICD-10-CM | POA: Insufficient documentation

## 2012-11-29 DIAGNOSIS — Z9861 Coronary angioplasty status: Secondary | ICD-10-CM | POA: Insufficient documentation

## 2012-11-29 DIAGNOSIS — F319 Bipolar disorder, unspecified: Secondary | ICD-10-CM | POA: Insufficient documentation

## 2012-11-29 DIAGNOSIS — I10 Essential (primary) hypertension: Secondary | ICD-10-CM | POA: Insufficient documentation

## 2012-11-29 DIAGNOSIS — Z862 Personal history of diseases of the blood and blood-forming organs and certain disorders involving the immune mechanism: Secondary | ICD-10-CM | POA: Insufficient documentation

## 2012-11-29 LAB — CBC
HCT: 38.7 % (ref 36.0–46.0)
Hemoglobin: 12.4 g/dL (ref 12.0–15.0)
MCH: 26.7 pg (ref 26.0–34.0)
MCHC: 32 g/dL (ref 30.0–36.0)
MCV: 83.2 fL (ref 78.0–100.0)
Platelets: 341 10*3/uL (ref 150–400)
RBC: 4.65 MIL/uL (ref 3.87–5.11)
RDW: 15.5 % (ref 11.5–15.5)
WBC: 7.4 10*3/uL (ref 4.0–10.5)

## 2012-11-29 LAB — POCT I-STAT TROPONIN I: Troponin i, poc: 0 ng/mL (ref 0.00–0.08)

## 2012-11-29 LAB — BASIC METABOLIC PANEL
BUN: 6 mg/dL (ref 6–23)
CO2: 22 mEq/L (ref 19–32)
Calcium: 9.3 mg/dL (ref 8.4–10.5)
Chloride: 100 mEq/L (ref 96–112)
Creatinine, Ser: 0.66 mg/dL (ref 0.50–1.10)
GFR calc Af Amer: >90 mL/min (ref >90)
GFR calc non Af Amer: >90 mL/min (ref >90)
Glucose, Bld: 128 mg/dL — ABNORMAL HIGH (ref 70–99)
Potassium: 3.7 mEq/L (ref 3.5–5.1)
Sodium: 132 mEq/L — ABNORMAL LOW (ref 135–145)

## 2012-11-29 LAB — D-DIMER, QUANTITATIVE: D-Dimer, Quant: 0.38 ug/mL-FEU (ref 0.00–0.48)

## 2012-11-29 LAB — PRO B NATRIURETIC PEPTIDE: Pro B Natriuretic peptide (BNP): 95.9 pg/mL (ref 0–125)

## 2012-11-29 LAB — TROPONIN I: Troponin I: <0.3 ng/mL (ref <0.30)

## 2012-11-29 MED ORDER — TRAMADOL HCL 50 MG PO TABS
50.0000 mg | ORAL_TABLET | Freq: Once | ORAL | Status: AC
  Administered 2012-11-29: 50 mg via ORAL
  Filled 2012-11-29: qty 1

## 2012-11-29 MED ORDER — FENTANYL CITRATE 0.05 MG/ML IJ SOLN: 50.0000 ug | Freq: Once | INTRAMUSCULAR | Status: DC

## 2012-11-29 MED ORDER — KETOROLAC TROMETHAMINE 30 MG/ML IJ SOLN
30.0000 mg | Freq: Once | INTRAMUSCULAR | Status: AC
  Administered 2012-11-29: 30 mg via INTRAVENOUS
  Filled 2012-11-29: qty 1

## 2012-11-29 MED ORDER — CYCLOBENZAPRINE HCL 10 MG PO TABS: 10.0000 mg | ORAL_TABLET | Freq: Two times a day (BID) | ORAL | Status: DC | PRN

## 2012-11-29 MED ORDER — FENTANYL CITRATE 0.05 MG/ML IJ SOLN
75.0000 ug | Freq: Once | INTRAMUSCULAR | Status: AC
  Administered 2012-11-29: 75 ug via INTRAVENOUS
  Filled 2012-11-29: qty 2

## 2012-11-29 MED ORDER — TRAMADOL HCL 50 MG PO TABS: 50.0000 mg | ORAL_TABLET | Freq: Four times a day (QID) | ORAL | Status: DC | PRN

## 2012-11-29 MED ORDER — ASPIRIN 81 MG PO CHEW
324.0000 mg | CHEWABLE_TABLET | Freq: Once | ORAL | Status: AC
  Administered 2012-11-29: 324 mg via ORAL
  Filled 2012-11-29: qty 4

## 2012-11-29 MED ORDER — CYCLOBENZAPRINE HCL 10 MG PO TABS
10.0000 mg | ORAL_TABLET | Freq: Once | ORAL | Status: AC
  Administered 2012-11-29: 10 mg via ORAL
  Filled 2012-11-29: qty 1

## 2012-11-29 MED ORDER — NAPROXEN 500 MG PO TABS: 500.0000 mg | ORAL_TABLET | Freq: Two times a day (BID) | ORAL | Status: DC

## 2012-11-29 NOTE — ED Notes (Signed)
Patient states she started to have chest this morning in class. Patient states she notice more swelling in legs. This morning has some tingling in right hand. Patient also c/o pain in left hip which started yesterday and  has been getting worse. Some SOB, denies nausea or vomiting.

## 2012-11-29 NOTE — ED Notes (Signed)
Pt was trying to leave bc wait was so long and fell.  Made Charge Rn Candise Bowens aware.

## 2012-11-29 NOTE — ED Notes (Signed)
Went in room patient very upset asked for a pillow.  Ask Charge Nurse to speak with the patient.  Charge Nurse wnt in to speak with patient and pillow was giving as well.

## 2012-11-29 NOTE — ED Provider Notes (Signed)
CSN: [id number]     Arrival date & time 11/29/12  1151 History   First MD Initiated Contact with Patient 11/29/12 1427     Chief Complaint  Patient presents with  . Chest Pain   (Consider location/radiation/quality/duration/timing/severity/associated sxs/prior Treatment) HPI Comments: 47 yo female with CAD, HTN, smoking, lipids, GERD presents with left lateral thigh pain and chest pain.  I did review nursing notes and clarified with patient.  Left lateral hip pain since this am, no injury, no blood clot hx.  Tender to palpation and movement. Her chest episodes were yesterday, brief lasting a few seconds, non radiation, non exertional, no diaphoresis.  Pt has these randomly.  No recent stress.  Stents placed 2012, on asa. No recent surgery or injury.  Patient is a 47 y.o. female presenting with chest pain. The history is provided by the patient.  Chest Pain Pain location:  L chest Associated symptoms: no abdominal pain, no back pain, no cough, no fever, no headache, no shortness of breath and not vomiting     Past Medical History  Diagnosis Date  . CAD (coronary artery disease)     a. NSTEMI 8/12 tx with DES to [facility] and DES to pRCA;  Cardiac cath 09/29/10: oLAD 25%, m-dLAD 25-30%, mLAD 40%, D1 30%, mCFX 99%, pOM1 25%, pRCA 80% and 70%, oPDA 25%, EF 60%;  echo 8/12: EF 55-60%, mod LVH  . Hypertension   . Bipolar disorder   . GERD (gastroesophageal reflux disease)   . Dyslipidemia   . Tobacco abuse   . History of alcohol abuse   . Myocardial infarction     Sep 28, 2010   Past Surgical History  Procedure Laterality Date  . Cardiac catheterization    . Coronary angioplasty with stent placement     Family History  Problem Relation Age of Onset  . Hypertension Mother   . Mental illness Mother    History  Substance Use Topics  . Smoking status: Current Some Day Smoker -- 1.00 packs/day    Types: Cigarettes  . Smokeless tobacco: Not on file     Comment: 2 cigarettes daily  .  Alcohol Use: No     Comment: REMOTE USE   OB History   Grav Para Term Preterm Abortions TAB SAB Ect Mult Living                 Review of Systems  Constitutional: Negative for fever and chills.  Eyes: Negative for visual disturbance.  Respiratory: Negative for cough and shortness of breath.   Cardiovascular: Positive for chest pain.  Gastrointestinal: Negative for vomiting and abdominal pain.  Genitourinary: Negative for dysuria and flank pain.  Musculoskeletal: Positive for arthralgias. Negative for back pain, neck pain and neck stiffness.  Skin: Negative for rash.  Neurological: Negative for light-headedness and headaches.    Allergies  Codeine and Vicodin  Home Medications   Current Outpatient Rx  Name  Route  Sig  Dispense  Refill  . aspirin EC 81 MG tablet   Oral   Take 81 mg by mouth daily. For prevention of heart attack and stroke         . divalproex (DEPAKOTE ER) 500 MG 24 hr tablet   Oral   Take 1,000 mg by mouth daily. For mood control         . escitalopram (LEXAPRO) 20 MG tablet   Oral   Take 20 mg by mouth daily.         Marland Kitchen  hydrochlorothiazide (MICROZIDE) 12.5 MG capsule   Oral   Take 12.5 mg by mouth daily.         Marland Kitchen ibuprofen (ADVIL,MOTRIN) 200 MG tablet   Oral   Take 600 mg by mouth every 6 (six) hours as needed for pain.         . metoprolol tartrate (LOPRESSOR) 25 MG tablet   Oral   Take 50 mg by mouth 2 (two) times daily.          LMP 10/22/2012 Physical Exam  Nursing note and vitals reviewed. Constitutional: She is oriented to person, place, and time. She appears well-developed and well-nourished.  HENT:  Head: Normocephalic and atraumatic.  Eyes: Conjunctivae are normal. Right eye exhibits no discharge. Left eye exhibits no discharge.  Neck: Normal range of motion. Neck supple. No tracheal deviation present.  Cardiovascular: Normal rate and regular rhythm.   No murmur heard. Pulmonary/Chest: Effort normal and breath sounds  normal.  Abdominal: Soft. She exhibits no distension. There is no tenderness. There is no guarding.  Musculoskeletal: She exhibits tenderness (left lateral hip, nv intact LE, 2+ distal pulses LE, no medial or posterior tenderness, no signs of infection). She exhibits no edema.  Neurological: She is alert and oriented to person, place, and time.  Skin: Skin is warm. No rash noted.  Psychiatric: She has a normal mood and affect.    ED Course  Procedures (including critical care time) Labs Review Labs Reviewed  BASIC METABOLIC PANEL - Abnormal; Notable for the following:    Sodium 132 (*)    Glucose, Bld 128 (*)    All other components within normal limits  CBC  PRO B NATRIURETIC PEPTIDE  D-DIMER, QUANTITATIVE  TROPONIN I  POCT I-STAT TROPONIN I   Imaging Review No results found.  EKG Interpretation     Ventricular Rate:  81 PR Interval:  122 QRS Duration: 94 QT Interval:  363 QTC Calculation: 422 R Axis:   0 Text Interpretation:  Sinus rhythm Borderline repolarization abnormality Baseline wander in lead(s) V2 V5 V6 Similar to previous ST depression inferior, poor baseline, mild elevation aVL similar previous            MDM  No diagnosis found. Pt does have known CAD however CP atypical. Pt low risk blood clot, D dimer neg.  Hip pain, MSK, pain reproducable.  Pt does sit cross legged, xray to rule out fx. Pain meds given . Pt improved in ED.  EKG no acute findings.  Delta troponin done.  Pt understands that she likely needs a repeat stress test in the next week, to call her pcp/ cardiologist to arrange. No CP today.    Results and differential diagnosis were discussed with the patient. Close follow up outpatient was discussed, patient comfortable with the plan.  Paged cardiology to discuss close outpt fup/ dispo. Discussed with Dr Dorien Chihuahua, agreed with close outpt fup and possible stress test for episodes of cp yesterday.  Signed out to fup xray and dispo  home with pain meds and cardiology fup.    Diagnosis: Hip pain left, Chest pain      Enid Skeens, MD 11/29/12 1630

## 2012-11-29 NOTE — Progress Notes (Signed)
[facility] provided pt with a [facility] [employer] Card application.  °

## 2012-12-06 ENCOUNTER — Encounter (HOSPITAL_COMMUNITY): Payer: Self-pay | Admitting: Emergency Medicine

## 2012-12-06 ENCOUNTER — Emergency Department (HOSPITAL_COMMUNITY): Payer: Self-pay

## 2012-12-06 ENCOUNTER — Emergency Department (HOSPITAL_COMMUNITY)
Admission: EM | Admit: 2012-12-06 | Discharge: 2012-12-06 | Disposition: A | Discharge status: Home / Self Care | Payer: Self-pay | Attending: Emergency Medicine | Admitting: Emergency Medicine

## 2012-12-06 DIAGNOSIS — Z79899 Other long term (current) drug therapy: Secondary | ICD-10-CM | POA: Insufficient documentation

## 2012-12-06 DIAGNOSIS — Z8639 Personal history of other endocrine, nutritional and metabolic disease: Secondary | ICD-10-CM | POA: Insufficient documentation

## 2012-12-06 DIAGNOSIS — J039 Acute tonsillitis, unspecified: Secondary | ICD-10-CM | POA: Insufficient documentation

## 2012-12-06 DIAGNOSIS — I252 Old myocardial infarction: Secondary | ICD-10-CM | POA: Insufficient documentation

## 2012-12-06 DIAGNOSIS — R011 Cardiac murmur, unspecified: Secondary | ICD-10-CM | POA: Insufficient documentation

## 2012-12-06 DIAGNOSIS — R070 Pain in throat: Secondary | ICD-10-CM | POA: Insufficient documentation

## 2012-12-06 DIAGNOSIS — Z8739 Personal history of other diseases of the musculoskeletal system and connective tissue: Secondary | ICD-10-CM | POA: Insufficient documentation

## 2012-12-06 DIAGNOSIS — Z9861 Coronary angioplasty status: Secondary | ICD-10-CM | POA: Insufficient documentation

## 2012-12-06 DIAGNOSIS — F319 Bipolar disorder, unspecified: Secondary | ICD-10-CM | POA: Insufficient documentation

## 2012-12-06 DIAGNOSIS — R509 Fever, unspecified: Secondary | ICD-10-CM | POA: Insufficient documentation

## 2012-12-06 DIAGNOSIS — I2510 Atherosclerotic heart disease of native coronary artery without angina pectoris: Secondary | ICD-10-CM | POA: Insufficient documentation

## 2012-12-06 DIAGNOSIS — Z8719 Personal history of other diseases of the digestive system: Secondary | ICD-10-CM | POA: Insufficient documentation

## 2012-12-06 DIAGNOSIS — Z792 Long term (current) use of antibiotics: Secondary | ICD-10-CM | POA: Insufficient documentation

## 2012-12-06 DIAGNOSIS — Z791 Long term (current) use of non-steroidal anti-inflammatories (NSAID): Secondary | ICD-10-CM | POA: Insufficient documentation

## 2012-12-06 DIAGNOSIS — Z862 Personal history of diseases of the blood and blood-forming organs and certain disorders involving the immune mechanism: Secondary | ICD-10-CM | POA: Insufficient documentation

## 2012-12-06 DIAGNOSIS — R000 Tachycardia, unspecified: Secondary | ICD-10-CM | POA: Insufficient documentation

## 2012-12-06 DIAGNOSIS — F17200 Nicotine dependence, unspecified, uncomplicated: Secondary | ICD-10-CM | POA: Insufficient documentation

## 2012-12-06 DIAGNOSIS — I10 Essential (primary) hypertension: Secondary | ICD-10-CM | POA: Insufficient documentation

## 2012-12-06 LAB — POCT I-STAT, CHEM 8
BUN: 4 mg/dL — ABNORMAL LOW (ref 6–23)
Calcium, Ion: 1.1 mmol/L — ABNORMAL LOW (ref 1.12–1.23)
Chloride: 107 mEq/L (ref 96–112)
Creatinine, Ser: 0.9 mg/dL (ref 0.50–1.10)
Glucose, Bld: 101 mg/dL — ABNORMAL HIGH (ref 70–99)
HCT: 43 % (ref 36.0–46.0)
Hemoglobin: 14.6 g/dL (ref 12.0–15.0)
Potassium: 3.4 mEq/L — ABNORMAL LOW (ref 3.5–5.1)
Sodium: 138 mEq/L (ref 135–145)
TCO2: 21 mmol/L (ref 0–100)

## 2012-12-06 LAB — CBC
HCT: 37.6 % (ref 36.0–46.0)
Hemoglobin: 11.8 g/dL — ABNORMAL LOW (ref 12.0–15.0)
MCH: 26.2 pg (ref 26.0–34.0)
MCHC: 31.4 g/dL (ref 30.0–36.0)
MCV: 83.4 fL (ref 78.0–100.0)
Platelets: 321 10*3/uL (ref 150–400)
RBC: 4.51 MIL/uL (ref 3.87–5.11)
RDW: 16.1 % — ABNORMAL HIGH (ref 11.5–15.5)
WBC: 15.9 10*3/uL — ABNORMAL HIGH (ref 4.0–10.5)

## 2012-12-06 MED ORDER — MORPHINE SULFATE 2 MG/ML IJ SOLN
2.0000 mg | Freq: Once | INTRAMUSCULAR | Status: DC
  Filled 2012-12-06: qty 1

## 2012-12-06 MED ORDER — CLINDAMYCIN HCL 150 MG PO CAPS: 300.0000 mg | ORAL_CAPSULE | Freq: Three times a day (TID) | ORAL | Status: DC

## 2012-12-06 MED ORDER — SODIUM CHLORIDE 0.9 % IV SOLN
Freq: Once | INTRAVENOUS | Status: AC
  Administered 2012-12-06: 500 mL via INTRAVENOUS

## 2012-12-06 MED ORDER — ONDANSETRON HCL 4 MG/2ML IJ SOLN
4.0000 mg | Freq: Once | INTRAMUSCULAR | Status: AC
  Administered 2012-12-06: 4 mg via INTRAVENOUS
  Filled 2012-12-06: qty 2

## 2012-12-06 MED ORDER — MORPHINE SULFATE 2 MG/ML IJ SOLN
2.0000 mg | Freq: Once | INTRAMUSCULAR | Status: AC
  Administered 2012-12-06: 2 mg via INTRAVENOUS
  Filled 2012-12-06: qty 1

## 2012-12-06 MED ORDER — FENTANYL CITRATE 0.05 MG/ML IJ SOLN
50.0000 ug | Freq: Once | INTRAMUSCULAR | Status: AC
  Administered 2012-12-06: 50 ug via INTRAVENOUS
  Filled 2012-12-06: qty 2

## 2012-12-06 MED ORDER — HYDROCODONE-ACETAMINOPHEN 7.5-325 MG/15ML PO SOLN
15.0000 mL | Freq: Four times a day (QID) | ORAL | Status: DC | PRN
Start: 2012-12-06 — End: 2013-11-06

## 2012-12-06 MED ORDER — CLINDAMYCIN PHOSPHATE 600 MG/50ML IV SOLN
600.0000 mg | Freq: Once | INTRAVENOUS | Status: AC
  Administered 2012-12-06: 600 mg via INTRAVENOUS
  Filled 2012-12-06: qty 50

## 2012-12-06 MED ORDER — IOHEXOL 300 MG/ML  SOLN
80.0000 mL | Freq: Once | INTRAMUSCULAR | Status: AC | PRN
  Administered 2012-12-06: 80 mL via INTRAVENOUS

## 2012-12-06 MED ORDER — HYDROCODONE-ACETAMINOPHEN 7.5-325 MG/15ML PO SOLN
10.0000 mL | Freq: Once | ORAL | Status: AC
  Administered 2012-12-06: 10 mL via ORAL
  Filled 2012-12-06: qty 15

## 2012-12-06 NOTE — ED Notes (Signed)
Pt out of the department at this time to CT

## 2012-12-06 NOTE — ED Notes (Signed)
Pt has returned from being out of the department 

## 2012-12-06 NOTE — ED Provider Notes (Signed)
47 y/o old female with approximately 2 days of gradual onset, persistent, gradually worsening and now severe pain in her right neck, posterior pharynx associated with fevers. On exam the patient has trismus, she has difficulty opening her mouth, she is able to move her tongue in and out but the posterior pharynx cannot be visualized. On tongue depressor exam the patient gags and pushes the hand away. She has lymphadenopathy of her neck but is able to move her neck from left to right and flex and extend but with some discomfort. There is no torticollis. She has clear heart and lung sounds though she is tachycardic. She has a foul odor to breath.  I suspect that the patient has a posterior abscess or infection, this is poorly visualized on physical examination on the CT scan imaging to further elucidate the source of this infection, consider peritonsillar abscess, retropharyngeal abscess, less likely be Ludwig angina.  Medical screening examination/treatment/procedure(s) were conducted as a shared visit with non-physician practitioner(s) and myself.  I personally evaluated the patient during the encounter.  Clinical Impression: pharyngitis      Vida Roller, MD 12/07/12 1525

## 2012-12-06 NOTE — ED Notes (Signed)
Pt here for swelling in throat and flu like symptoms,

## 2012-12-06 NOTE — ED Provider Notes (Signed)
CSN: [id number]     Arrival date & time 12/06/12  0815 History   First MD Initiated Contact with Patient 12/06/12 [phone]     Chief Complaint  Patient presents with  . Sore Throat   (Consider location/radiation/quality/duration/timing/severity/associated sxs/prior Treatment) HPI Comments: The patient is a 47 year old with a past medical history of CAD, GERD, and HTN presenting to the ED with a 3 day history of worsening throat pain.  The patient reports a fever of 103 last night.  She reports she is unable to turn her head.  She reports sharp pain from ear to ear increases with swallowing.  She reports taking OTC pain medications without relief. Reports one episode of vomiting yesterday.   The history is provided by the patient.    Past Medical History  Diagnosis Date  . CAD (coronary artery disease)     a. NSTEMI 8/12 tx with DES to [facility] and DES to pRCA;  Cardiac cath 09/29/10: oLAD 25%, m-dLAD 25-30%, mLAD 40%, D1 30%, mCFX 99%, pOM1 25%, pRCA 80% and 70%, oPDA 25%, EF 60%;  echo 8/12: EF 55-60%, mod LVH  . Hypertension   . Bipolar disorder   . GERD (gastroesophageal reflux disease)   . Dyslipidemia   . Tobacco abuse   . History of alcohol abuse   . Myocardial infarction     Sep 28, 2010   Past Surgical History  Procedure Laterality Date  . Cardiac catheterization    . Coronary angioplasty with stent placement     Family History  Problem Relation Age of Onset  . Hypertension Mother   . Mental illness Mother    History  Substance Use Topics  . Smoking status: Current Some Day Smoker -- 1.00 packs/day    Types: Cigarettes  . Smokeless tobacco: Not on file     Comment: 2 cigarettes daily  . Alcohol Use: No     Comment: REMOTE USE   OB History   Grav Para Term Preterm Abortions TAB SAB Ect Mult Living                 Review of Systems  All other systems reviewed and are negative.    Allergies  Codeine and Vicodin  Home Medications   Current Outpatient Rx  Name   Route  Sig  Dispense  Refill  . cyclobenzaprine (FLEXERIL) 10 MG tablet   Oral   Take 1 tablet (10 mg total) by mouth 2 (two) times daily as needed for muscle spasms.   10 tablet   0   . divalproex (DEPAKOTE ER) 500 MG 24 hr tablet   Oral   Take 1,000 mg by mouth daily. For mood control         . escitalopram (LEXAPRO) 20 MG tablet   Oral   Take 20 mg by mouth daily.         . hydrochlorothiazide (MICROZIDE) 12.5 MG capsule   Oral   Take 12.5 mg by mouth daily.         . metoprolol tartrate (LOPRESSOR) 25 MG tablet   Oral   Take 50 mg by mouth 2 (two) times daily.         . naproxen (NAPROSYN) 500 MG tablet   Oral   Take 1 tablet (500 mg total) by mouth 2 (two) times daily.   10 tablet   0   . traMADol (ULTRAM) 50 MG tablet   Oral   Take 1 tablet (50 mg total) by  mouth every 6 (six) hours as needed for pain.   6 tablet   0   . clindamycin (CLEOCIN) 150 MG capsule   Oral   Take 2 capsules (300 mg total) by mouth 3 (three) times daily.   21 capsule   0   . HYDROcodone-acetaminophen (HYCET) 7.5-325 mg/15 ml solution   Oral   Take 15 mLs by mouth 4 (four) times daily as needed for pain.   120 mL   0    BP 180/99  Pulse 129  Temp(Src) 100.7 F (38.2 C) (Oral)  Resp 18  Ht 5\' 1"  (1.549 m)  Wt 180 lb (81.647 kg)  BMI 34.03 kg/m2  SpO2 99%  LMP 10/22/2012 Physical Exam  Nursing note and vitals reviewed. Constitutional: She is oriented to person, place, and time. She appears well-developed. She appears distressed.  HENT:  Head: Normocephalic and atraumatic.  Right Ear: Tympanic membrane normal. Tympanic membrane is not bulging.  Left Ear: Tympanic membrane normal. Tympanic membrane is not bulging.  Mouth/Throat: There is trismus in the jaw.  Unable to visualize posterior oropharynx due to the patient being unable to open her mouth.   Eyes: EOM are normal.  Neck: Rigidity present. Decreased range of motion present.  Mild decrease ROM with flexion and  extension. Moderate decrease ROM with lateral flexion, and rotation of the neck.    Cardiovascular: Regular rhythm.  Tachycardia present.   Murmur heard.  Systolic murmur is present  Pulmonary/Chest: Effort normal and breath sounds normal. She has no wheezes. She has no rales.  Abdominal: Soft. Bowel sounds are normal. There is no tenderness. There is no rebound and no guarding.  Musculoskeletal: She exhibits no edema and no tenderness.  Lymphadenopathy:       Head (right side): Submandibular and tonsillar adenopathy present. No submental and no occipital adenopathy present.       Head (left side): Submandibular and tonsillar adenopathy present. No submental and no occipital adenopathy present.  Neurological: She is alert and oriented to person, place, and time.  Skin: Skin is warm. No rash noted. She is not diaphoretic.  Psychiatric: She has a normal mood and affect.    ED Course  Procedures (including critical care time) Labs Review Labs Reviewed  CBC - Abnormal; Notable for the following:    WBC 15.9 (*)    Hemoglobin 11.8 (*)    RDW 16.1 (*)    All other components within normal limits  POCT I-STAT, CHEM 8 - Abnormal; Notable for the following:    Potassium 3.4 (*)    BUN 4 (*)    Glucose, Bld 101 (*)    Calcium, Ion 1.10 (*)    All other components within normal limits   Imaging Review Ct Soft Tissue Neck W Contrast  12/06/2012   CLINICAL DATA:  Productive cough. Sore throat. Difficulty swallowing.  EXAM: CT NECK WITH CONTRAST  TECHNIQUE: Multidetector CT imaging of the neck was performed using the standard protocol following the bolus administration of intravenous contrast.  CONTRAST:  80mL OMNIPAQUE IOHEXOL 300 MG/ML  SOLN  COMPARISON:  None.  FINDINGS: Marked inflammation of the palatine tonsils with enlargement of lymphoid tissue of Waldeyer's ring in a diffuse fashion narrowing air column at this level. Minimal haziness of fat planes along the periphery of the pharynx and  loss of fat plane between the posterior pharynx and the prevertebral soft tissue may represent reactive changes without deep drainable abscess identified.  Adenopathy largest in the level 2 region  most consistent with reactive changes.  Tumor felt to be a much less likely given the clinical presentation.  Prominence of the thyroid gland with mild substernal extension on the left. No dominant mass identified.  No evidence of septic thrombophlebitis of the internal jugular vein.  Advanced atherosclerotic type changes aortic arch and great vessels with calcifications noted.  Visualized lungs clear.  No bony destructive lesion.  Caries.  Minimal mucosal thickening maxillary sinus more notable on the left.  Partially empty sella.  IMPRESSION: Marked inflammation of the palatine tonsils with enlargement of lymphoid tissue of Waldeyer's ring in a diffuse fashion narrowing air column at this level. Minimal haziness of fat planes along the periphery of the pharynx and loss of fat plane between the posterior pharynx and the prevertebral soft tissue may represent reactive changes without deep drainable abscess identified.  Adenopathy largest in the level 2 region most consistent with reactive changes.  Please see above.  These results were called by telephone at the time of interpretation on 12/06/2012 at 10:42 AM to Dr. Mellody Drown , who verbally acknowledged these results.   Electronically Signed   By: Bridgett Larsson M.D.   On: 12/06/2012 10:45    EKG Interpretation   None       MDM   1. Acute infective tonsillitis    Patient presents with worsening throat pain, fever an.  On exam she appears uncomfortable and anxious, she exhibits trismus and posterior oropharynx is not well visualized, tachycardic, and lymphadenopathy present bilateral tonsillitis.  Given the patient history and exam will rule out peritonsillar abscess.  No cellulitis to lower jaw, less likely Ludwig angina.   1040 Discussed CT results with  the Radiologist, reports non-drainable abscess at this time. Narrowing of airway but trachea patent at this time.    Discussed tests results with patient and will discharge her home with antibiotics and pain medication.   Explained need to return to ED if symptoms worsen, or if there is a SOB.   Patient temperature prior to discharge 100.7  Meds given in ED:  Medications  clindamycin (CLEOCIN) IVPB 600 mg (0 mg Intravenous Stopped 12/06/12 1038)  morphine 2 MG/ML injection 2 mg (2 mg Intravenous Given 12/06/12 0850)  0.9 %  sodium chloride infusion ( Intravenous Stopped 12/06/12 1130)  ondansetron (ZOFRAN) injection 4 mg (4 mg Intravenous Given 12/06/12 0932)  fentaNYL (SUBLIMAZE) injection 50 mcg (50 mcg Intravenous Given 12/06/12 0932)  iohexol (OMNIPAQUE) 300 MG/ML solution 80 mL (80 mLs Intravenous Contrast Given 12/06/12 0957)  HYDROcodone-acetaminophen (HYCET) 7.5-325 mg/15 ml solution 10 mL (10 mLs Oral Given 12/06/12 1128)    Discharge Medication List as of 12/06/2012 11:05 AM    START taking these medications   Details  clindamycin (CLEOCIN) 150 MG capsule Take 2 capsules (300 mg total) by mouth 3 (three) times daily., Starting 12/06/2012, Until Discontinued, Print    HYDROcodone-acetaminophen (HYCET) 7.5-325 mg/15 ml solution Take 15 mLs by mouth 4 (four) times daily as needed for pain., Starting 12/06/2012, Until Thu 12/06/13, Print          Clabe Seal, PA-C 12/06/12 1535

## 2012-12-07 NOTE — ED Provider Notes (Signed)
Medical screening examination/treatment/procedure(s) were conducted as a shared visit with non-physician practitioner(s) and myself.  I personally evaluated the patient during the encounter  Please see my separate respective documentation pertaining to this patient encounter   Vida Roller, MD 12/07/12 1525

## 2013-10-30 ENCOUNTER — Encounter (HOSPITAL_COMMUNITY): Payer: Self-pay | Admitting: Emergency Medicine

## 2013-10-30 ENCOUNTER — Emergency Department (HOSPITAL_COMMUNITY)
Admission: EM | Admit: 2013-10-30 | Discharge: 2013-10-30 | Disposition: A | Discharge status: Home / Self Care | Payer: Self-pay | Attending: Emergency Medicine | Admitting: Emergency Medicine

## 2013-10-30 DIAGNOSIS — K0889 Other specified disorders of teeth and supporting structures: Secondary | ICD-10-CM

## 2013-10-30 DIAGNOSIS — Z8719 Personal history of other diseases of the digestive system: Secondary | ICD-10-CM | POA: Insufficient documentation

## 2013-10-30 DIAGNOSIS — F17200 Nicotine dependence, unspecified, uncomplicated: Secondary | ICD-10-CM | POA: Insufficient documentation

## 2013-10-30 DIAGNOSIS — Z791 Long term (current) use of non-steroidal anti-inflammatories (NSAID): Secondary | ICD-10-CM | POA: Insufficient documentation

## 2013-10-30 DIAGNOSIS — F319 Bipolar disorder, unspecified: Secondary | ICD-10-CM | POA: Insufficient documentation

## 2013-10-30 DIAGNOSIS — Z951 Presence of aortocoronary bypass graft: Secondary | ICD-10-CM | POA: Insufficient documentation

## 2013-10-30 DIAGNOSIS — I252 Old myocardial infarction: Secondary | ICD-10-CM | POA: Insufficient documentation

## 2013-10-30 DIAGNOSIS — Z862 Personal history of diseases of the blood and blood-forming organs and certain disorders involving the immune mechanism: Secondary | ICD-10-CM | POA: Insufficient documentation

## 2013-10-30 DIAGNOSIS — Z792 Long term (current) use of antibiotics: Secondary | ICD-10-CM | POA: Insufficient documentation

## 2013-10-30 DIAGNOSIS — I2510 Atherosclerotic heart disease of native coronary artery without angina pectoris: Secondary | ICD-10-CM | POA: Insufficient documentation

## 2013-10-30 DIAGNOSIS — K089 Disorder of teeth and supporting structures, unspecified: Secondary | ICD-10-CM | POA: Insufficient documentation

## 2013-10-30 DIAGNOSIS — Z9861 Coronary angioplasty status: Secondary | ICD-10-CM | POA: Insufficient documentation

## 2013-10-30 DIAGNOSIS — I10 Essential (primary) hypertension: Secondary | ICD-10-CM | POA: Insufficient documentation

## 2013-10-30 DIAGNOSIS — Z79899 Other long term (current) drug therapy: Secondary | ICD-10-CM | POA: Insufficient documentation

## 2013-10-30 DIAGNOSIS — K029 Dental caries, unspecified: Secondary | ICD-10-CM | POA: Insufficient documentation

## 2013-10-30 DIAGNOSIS — Z8639 Personal history of other endocrine, nutritional and metabolic disease: Secondary | ICD-10-CM | POA: Insufficient documentation

## 2013-10-30 MED ORDER — PENICILLIN V POTASSIUM 500 MG PO TABS: 500.0000 mg | ORAL_TABLET | Freq: Four times a day (QID) | ORAL | Status: DC

## 2013-10-30 MED ORDER — OXYCODONE-ACETAMINOPHEN 5-325 MG PO TABS
2.0000 | ORAL_TABLET | Freq: Once | ORAL | Status: AC
  Administered 2013-10-30: 2 via ORAL
  Filled 2013-10-30: qty 2

## 2013-10-30 MED ORDER — OXYCODONE-ACETAMINOPHEN 5-325 MG PO TABS: 1.0000 | ORAL_TABLET | ORAL | Status: DC | PRN

## 2013-10-30 MED ORDER — FLUCONAZOLE 200 MG PO TABS: 200.0000 mg | ORAL_TABLET | Freq: Every day | ORAL | Status: DC

## 2013-10-30 NOTE — ED Provider Notes (Signed)
CSN: [id number]     Arrival date & time 10/30/13  1518 History   First MD Initiated Contact with Patient 10/30/13 1535     Chief Complaint  Patient presents with  . Dental Pain     (Consider location/radiation/quality/duration/timing/severity/associated sxs/prior Treatment) HPI Comments: The patient is a 48 year old otherwise healthy female who presents with dental pain that started gradually 3 days ago. The dental pain is severe, constant and progressively worsening. The pain is aching and located in right lower jaw. The pain does not radiate. Eating makes the pain worse. Nothing makes the pain better. The patient has not tried anything for pain. No associated symptoms. Patient denies headache, neck pain/stiffness, fever, NVD, edema, sore throat, throat swelling, wheezing, SOB, chest pain, abdominal pain.      Past Medical History  Diagnosis Date  . CAD (coronary artery disease)     a. NSTEMI 8/12 tx with DES to [facility] and DES to pRCA;  Cardiac cath 09/29/10: oLAD 25%, m-dLAD 25-30%, mLAD 40%, D1 30%, mCFX 99%, pOM1 25%, pRCA 80% and 70%, oPDA 25%, EF 60%;  echo 8/12: EF 55-60%, mod LVH  . Hypertension   . Bipolar disorder   . GERD (gastroesophageal reflux disease)   . Dyslipidemia   . Tobacco abuse   . History of alcohol abuse   . Myocardial infarction     Sep 28, 2010   Past Surgical History  Procedure Laterality Date  . Cardiac catheterization    . Coronary angioplasty with stent placement     Family History  Problem Relation Age of Onset  . Hypertension Mother   . Mental illness Mother    History  Substance Use Topics  . Smoking status: Current Some Day Smoker -- 1.00 packs/day    Types: Cigarettes  . Smokeless tobacco: Not on file     Comment: 2 cigarettes daily  . Alcohol Use: No     Comment: REMOTE USE   OB History   Grav Para Term Preterm Abortions TAB SAB Ect Mult Living                 Review of Systems  Constitutional: Negative for fever, chills and  fatigue.  HENT: Positive for dental problem. Negative for trouble swallowing.   Eyes: Negative for visual disturbance.  Respiratory: Negative for shortness of breath.   Cardiovascular: Negative for chest pain and palpitations.  Gastrointestinal: Negative for nausea, vomiting, abdominal pain and diarrhea.  Genitourinary: Negative for dysuria and difficulty urinating.  Musculoskeletal: Negative for arthralgias and neck pain.  Skin: Negative for color change.  Neurological: Negative for dizziness and weakness.  Psychiatric/Behavioral: Negative for dysphoric mood.      Allergies  Codeine and Vicodin  Home Medications   Prior to Admission medications   Medication Sig Start Date End Date Taking? Authorizing Provider  clindamycin (CLEOCIN) 150 MG capsule Take 2 capsules (300 mg total) by mouth 3 (three) times daily. 12/06/12   Mellody Drown, PA-C  cyclobenzaprine (FLEXERIL) 10 MG tablet Take 1 tablet (10 mg total) by mouth 2 (two) times daily as needed for muscle spasms. 11/29/12   Enid Skeens, MD  divalproex (DEPAKOTE ER) 500 MG 24 hr tablet Take 1,000 mg by mouth daily. For mood control    Historical Provider, MD  escitalopram (LEXAPRO) 20 MG tablet Take 20 mg by mouth daily.    Historical Provider, MD  hydrochlorothiazide (MICROZIDE) 12.5 MG capsule Take 12.5 mg by mouth daily.    Historical Provider, MD  HYDROcodone-acetaminophen (HYCET) 7.5-325 mg/15 ml solution Take 15 mLs by mouth 4 (four) times daily as needed for pain. 12/06/12 12/06/13  Mellody Drown, PA-C  metoprolol tartrate (LOPRESSOR) 25 MG tablet Take 50 mg by mouth 2 (two) times daily.    Historical Provider, MD  naproxen (NAPROSYN) 500 MG tablet Take 1 tablet (500 mg total) by mouth 2 (two) times daily. 11/29/12   Enid Skeens, MD  traMADol (ULTRAM) 50 MG tablet Take 1 tablet (50 mg total) by mouth every 6 (six) hours as needed for pain. 11/29/12   Enid Skeens, MD   BP 223/112  Pulse 84  Temp(Src) 98.1 F (36.7  C) (Oral)  Resp 18  Ht  (1.549 m)  Wt 178 lb (80.74 kg)  BMI 33.65 kg/m2  SpO2 100%  LMP 10/18/2013 Physical Exam  Nursing note and vitals reviewed. Constitutional: She is oriented to person, place, and time. She appears well-developed and well-nourished. No distress.  HENT:  Head: Normocephalic and atraumatic.  Poor dentition. Right lower molars decayed and tender to percussion. No abscess noted.   Eyes: Conjunctivae and EOM are normal.  Neck: Normal range of motion.  Cardiovascular: Normal rate and regular rhythm.  Exam reveals no gallop and no friction rub.   No murmur heard. Pulmonary/Chest: Effort normal and breath sounds normal. She has no wheezes. She has no rales. She exhibits no tenderness.  Musculoskeletal: Normal range of motion.  Lymphadenopathy:    She has no cervical adenopathy.  Neurological: She is alert and oriented to person, place, and time. Coordination normal.  Speech is goal-oriented. Moves limbs without ataxia.   Skin: Skin is warm and dry.  Psychiatric: She has a normal mood and affect. Her behavior is normal.    ED Course  Procedures (including critical care time) Labs Review Labs Reviewed - No data to display  Imaging Review No results found.   EKG Interpretation None      MDM   Final diagnoses:  Pain, dental   4:08 PM Patient will have veetid and percocet for dental pain. No signs of ludwigs angina. Vitals stable and patient afebrile.     Emilia Beck, PA-C 11/01/13 1355

## 2013-10-30 NOTE — ED Notes (Signed)
Pt reports dental pain to right side of her mouth for several days; has appointment to see dentist. Pain is worse. Is a x 4. IN NAD

## 2013-10-30 NOTE — ED Notes (Signed)
Hasn't been taking BP meds, should get refill this week after insurance is active. PA in [place] made aware.

## 2013-10-30 NOTE — Discharge Instructions (Signed)
Take percocet as needed for pain. Take veetid as directed until gone. Refer to attached documents for more information. Follow up with your dentist.

## 2013-11-01 NOTE — ED Provider Notes (Signed)
Medical screening examination/treatment/procedure(s) were performed by non-physician practitioner and as supervising physician I was immediately available for consultation/collaboration.     Chalese Peach, MD 11/01/13 1548 

## 2013-11-06 ENCOUNTER — Encounter (HOSPITAL_COMMUNITY): Payer: Self-pay | Admitting: Emergency Medicine

## 2013-11-06 ENCOUNTER — Observation Stay (HOSPITAL_COMMUNITY)
Admission: EM | Admit: 2013-11-06 | Discharge: 2013-11-08 | Disposition: A | Discharge status: Home / Self Care | Payer: Self-pay | Attending: Interventional Cardiology | Admitting: Interventional Cardiology

## 2013-11-06 ENCOUNTER — Emergency Department (HOSPITAL_COMMUNITY): Payer: Self-pay

## 2013-11-06 DIAGNOSIS — E785 Hyperlipidemia, unspecified: Secondary | ICD-10-CM | POA: Diagnosis present

## 2013-11-06 DIAGNOSIS — F319 Bipolar disorder, unspecified: Secondary | ICD-10-CM | POA: Insufficient documentation

## 2013-11-06 DIAGNOSIS — Z955 Presence of coronary angioplasty implant and graft: Secondary | ICD-10-CM | POA: Insufficient documentation

## 2013-11-06 DIAGNOSIS — F17210 Nicotine dependence, cigarettes, uncomplicated: Secondary | ICD-10-CM | POA: Insufficient documentation

## 2013-11-06 DIAGNOSIS — Z6834 Body mass index (BMI) 34.0-34.9, adult: Secondary | ICD-10-CM | POA: Insufficient documentation

## 2013-11-06 DIAGNOSIS — Z720 Tobacco use: Secondary | ICD-10-CM | POA: Diagnosis present

## 2013-11-06 DIAGNOSIS — I10 Essential (primary) hypertension: Secondary | ICD-10-CM | POA: Diagnosis present

## 2013-11-06 DIAGNOSIS — E782 Mixed hyperlipidemia: Secondary | ICD-10-CM

## 2013-11-06 DIAGNOSIS — R0789 Other chest pain: Principal | ICD-10-CM

## 2013-11-06 DIAGNOSIS — I252 Old myocardial infarction: Secondary | ICD-10-CM | POA: Insufficient documentation

## 2013-11-06 DIAGNOSIS — R079 Chest pain, unspecified: Secondary | ICD-10-CM | POA: Diagnosis present

## 2013-11-06 DIAGNOSIS — G4730 Sleep apnea, unspecified: Secondary | ICD-10-CM

## 2013-11-06 DIAGNOSIS — I2583 Coronary atherosclerosis due to lipid rich plaque: Secondary | ICD-10-CM

## 2013-11-06 DIAGNOSIS — K219 Gastro-esophageal reflux disease without esophagitis: Secondary | ICD-10-CM | POA: Insufficient documentation

## 2013-11-06 DIAGNOSIS — I2510 Atherosclerotic heart disease of native coronary artery without angina pectoris: Secondary | ICD-10-CM | POA: Diagnosis present

## 2013-11-06 DIAGNOSIS — E669 Obesity, unspecified: Secondary | ICD-10-CM | POA: Insufficient documentation

## 2013-11-06 DIAGNOSIS — H538 Other visual disturbances: Secondary | ICD-10-CM

## 2013-11-06 DIAGNOSIS — Z79899 Other long term (current) drug therapy: Secondary | ICD-10-CM | POA: Insufficient documentation

## 2013-11-06 LAB — BASIC METABOLIC PANEL
Anion gap: 12 (ref 5–15)
BUN: 6 mg/dL (ref 6–23)
CO2: 23 mEq/L (ref 19–32)
Calcium: 9.6 mg/dL (ref 8.4–10.5)
Chloride: 102 mEq/L (ref 96–112)
Creatinine, Ser: 0.63 mg/dL (ref 0.50–1.10)
GFR calc Af Amer: >90 mL/min (ref >90)
GFR calc non Af Amer: >90 mL/min (ref >90)
Glucose, Bld: 94 mg/dL (ref 70–99)
Potassium: 4.4 mEq/L (ref 3.7–5.3)
Sodium: 137 mEq/L (ref 137–147)

## 2013-11-06 LAB — TROPONIN I
Troponin I: <0.3 ng/mL (ref <0.30)
Troponin I: <0.3 ng/mL (ref <0.30)
Troponin I: <0.3 ng/mL (ref <0.30)

## 2013-11-06 LAB — CBC
HCT: 37.4 % (ref 36.0–46.0)
Hemoglobin: 12 g/dL (ref 12.0–15.0)
MCH: 27.3 pg (ref 26.0–34.0)
MCHC: 32.1 g/dL (ref 30.0–36.0)
MCV: 85.2 fL (ref 78.0–100.0)
Platelets: 301 10*3/uL (ref 150–400)
RBC: 4.39 MIL/uL (ref 3.87–5.11)
RDW: 16.2 % — ABNORMAL HIGH (ref 11.5–15.5)
WBC: 5.9 10*3/uL (ref 4.0–10.5)

## 2013-11-06 LAB — TSH: TSH: 1.22 u[IU]/mL (ref 0.350–4.500)

## 2013-11-06 MED ORDER — ASPIRIN 81 MG PO CHEW
324.0000 mg | CHEWABLE_TABLET | Freq: Once | ORAL | Status: AC
  Administered 2013-11-06: 324 mg via ORAL
  Filled 2013-11-06: qty 4

## 2013-11-06 MED ORDER — ONDANSETRON HCL 4 MG/2ML IJ SOLN: 4.0000 mg | Freq: Four times a day (QID) | INTRAMUSCULAR | Status: DC | PRN

## 2013-11-06 MED ORDER — LORAZEPAM 0.5 MG PO TABS
0.2500 mg | ORAL_TABLET | Freq: Once | ORAL | Status: AC
  Administered 2013-11-06: 0.25 mg via ORAL
  Filled 2013-11-06: qty 1

## 2013-11-06 MED ORDER — DIVALPROEX SODIUM ER 500 MG PO TB24
1000.0000 mg | ORAL_TABLET | Freq: Every day | ORAL | Status: DC
  Administered 2013-11-06 – 2013-11-08 (×3): 1000 mg via ORAL
  Filled 2013-11-06 (×3): qty 2

## 2013-11-06 MED ORDER — METOPROLOL TARTRATE 50 MG PO TABS
50.0000 mg | ORAL_TABLET | Freq: Two times a day (BID) | ORAL | Status: DC
  Administered 2013-11-06 – 2013-11-08 (×4): 50 mg via ORAL
  Filled 2013-11-06 (×5): qty 1

## 2013-11-06 MED ORDER — SIMVASTATIN 20 MG PO TABS
20.0000 mg | ORAL_TABLET | Freq: Every day | ORAL | Status: DC
  Administered 2013-11-07: 20 mg via ORAL
  Filled 2013-11-06 (×3): qty 1

## 2013-11-06 MED ORDER — ASPIRIN EC 81 MG PO TBEC
81.0000 mg | DELAYED_RELEASE_TABLET | Freq: Every day | ORAL | Status: DC
  Administered 2013-11-07: 81 mg via ORAL
  Filled 2013-11-06: qty 1

## 2013-11-06 MED ORDER — ACETAMINOPHEN 325 MG PO TABS
650.0000 mg | ORAL_TABLET | ORAL | Status: DC | PRN
Start: 2013-11-06 — End: 2013-11-08

## 2013-11-06 MED ORDER — ESCITALOPRAM OXALATE 20 MG PO TABS
20.0000 mg | ORAL_TABLET | Freq: Every day | ORAL | Status: DC
  Administered 2013-11-06 – 2013-11-08 (×3): 20 mg via ORAL
  Filled 2013-11-06 (×3): qty 1

## 2013-11-06 MED ORDER — NITROGLYCERIN 0.4 MG SL SUBL: 0.4000 mg | SUBLINGUAL_TABLET | SUBLINGUAL | Status: DC | PRN

## 2013-11-06 MED ORDER — HYDROCHLOROTHIAZIDE 12.5 MG PO CAPS
12.5000 mg | ORAL_CAPSULE | Freq: Every day | ORAL | Status: DC
  Administered 2013-11-06 – 2013-11-08 (×3): 12.5 mg via ORAL
  Filled 2013-11-06 (×3): qty 1

## 2013-11-06 NOTE — ED Notes (Signed)
Pt taken to CT.

## 2013-11-06 NOTE — ED Notes (Signed)
Pt c/o right eye blurred vision and left sided CP and numbness in left arm starting yesterday; pt sts fatigue x 1 week

## 2013-11-06 NOTE — H&P (Signed)
The history is one of fatigue, chest tightness, and dyspnea. This has been gradually worsening for some time (>6 months). No also complains of excessive daytime sleepiness, snoring, complaints from family about snoring , and observed apnea. Also states she does not use BP meds all the time.  Suspect her major problem may be untreated sleep apnea(obstructive). We need to encourage med compliance and exclude mod to high risk coronary disease before discharge. If markers, ECG, or nuclear suggest ischemia, will need repeat cath.  Perhaps the most important outcome from this admission will be lining up Sleep Study and treating OSA.

## 2013-11-06 NOTE — Discharge Planning (Signed)
[facility] Community Liaison  Spoke to the patient regarding primary care resources and establishing care with a provider. Resource guide and my contact information provided. Pt on the phone,no other community liaison needs identified at this time.

## 2013-11-06 NOTE — ED Notes (Signed)
Pt states that the blurriness in her rt eye "comes and goes". Pt denies headache. Pt states that it is "wavey". Pt states that her left arm feels numb and has decreased sensation. Pt states that this has been since yesterday. Pt states that she also has SOB with exertion. Pt reports chest pain that radiates to left arm.

## 2013-11-06 NOTE — ED Provider Notes (Signed)
CSN: [id number]     Arrival date & time 11/06/13  [id number] History   First MD Initiated Contact with Patient 11/06/13 [phone]     Chief Complaint  Patient presents with  . Chest Pain  . Blurred Vision     (Consider location/radiation/quality/duration/timing/severity/associated sxs/prior Treatment) HPI Pt presenting with c/o chest pain with radiation down to left arm and into left jaw.  She states chest pain started yesterday and has been intermittent.  She also c/o generalized weakness and fatigue.  Also c/o sensation of blurry vision in her right eye which she has noted the past couple of days.  No changes in speech.  No focal weakness of extremities, no vertigo.  There are no other associated systemic symptoms, there are no other alleviating or modifying factors. She states that when she had prior MI her pain was more pressure like and today the pain is more sharp.  There are no other associated systemic symptoms, there are no other alleviating or modifying factors.   Past Medical History  Diagnosis Date  . CAD (coronary artery disease)     a. [facility];  Cardiac cath 09/29/10: oLAD 25%, m-dLAD 25-30%, mLAD 40%, D1 30%, mCFX 99%, pOM1 25%, pRCA 80% and 70%, oPDA 25%, EF 60%;  echo 8/12: EF 55-60%, mod LVH  . Hypertension   . Bipolar disorder   . GERD (gastroesophageal reflux disease)   . Dyslipidemia   . Tobacco abuse   . History of alcohol abuse   . Myocardial infarction     Sep 28, 2010   Past Surgical History  Procedure Laterality Date  . Cardiac catheterization    . Coronary angioplasty with stent placement     Family History  Problem Relation Age of Onset  . Hypertension Mother   . Mental illness Mother    History  Substance Use Topics  . Smoking status: Current Some Day Smoker -- 1.00 packs/day    Types: Cigarettes  . Smokeless tobacco: Not on file     Comment: 2 cigarettes daily  . Alcohol Use: No     Comment: REMOTE USE   OB  History   Grav Para Term Preterm Abortions TAB SAB Ect Mult Living                 Review of Systems ROS reviewed and all otherwise negative except for mentioned in HPI    Allergies  Codeine and Vicodin  Home Medications   Prior to Admission medications   Medication Sig Start Date End Date Taking? Authorizing Provider  divalproex (DEPAKOTE ER) 500 MG 24 hr tablet Take 1,000 mg by mouth daily. For mood control   Yes Historical Provider, MD  escitalopram (LEXAPRO) 20 MG tablet Take 20 mg by mouth daily.   Yes Historical Provider, MD  hydrochlorothiazide (MICROZIDE) 12.5 MG capsule Take 12.5 mg by mouth daily.   Yes Historical Provider, MD  metoprolol tartrate (LOPRESSOR) 25 MG tablet Take 50 mg by mouth 2 (two) times daily.   Yes Historical Provider, MD  penicillin v potassium (VEETID) 500 MG tablet Take 1 tablet (500 mg total) by mouth 4 (four) times daily. 10/30/13  Yes Kaitlyn Szekalski, PA-C   BP 168/97  Pulse 83  Temp(Src) 98.1 F (36.7 C) (Oral)  Resp 16  SpO2 100%  LMP 10/18/2013 Vitals reviewed Physical Exam Physical Examination: General appearance - alert, well appearing, and in no distress Mental status - alert, oriented to person,  place, and time Eyes - no conjunctival injection, no scleral icterus Mouth - mucous membranes moist, pharynx normal without lesions Chest - clear to auscultation, no wheezes, rales or rhonchi, symmetric air entry Heart - normal rate, regular rhythm, normal S1, S2, no murmurs, rubs, clicks or gallops Abdomen - soft, nontender, nondistended, no masses or organomegaly Neurological - alert, oriented x 3, cranial nerves 2-12 tested and intact, strength 5/5 and symmetric, sensation intact Extremities - peripheral pulses normal, no pedal edema, no clubbing or cyanosis Skin - normal coloration and turgor, no rashes  ED Course  Procedures (including critical care time)  2:05 PM pt awaiting cardiology consult at this time.  She is asking to leave  the ED, I have advised her to wait for cardiology consult.   3:18 PM long discussion with patient about risks of leaving AMA and reasons for wanting cardiology to see her.  As we were talking cardiology consult arrived.     Date: 11/06/2013  Rate: 79  Rhythm: normal sinus rhythm  QRS Axis: normal  Intervals: normal  ST/T Wave abnormalities: normal  Conduction Disutrbances: none  Narrative Interpretation: unremarkable EKG not available in epic for interpretation in muse   Labs Review Labs Reviewed  CBC - Abnormal; Notable for the following:    RDW 16.2 (*)    All other components within normal limits  TROPONIN I  BASIC METABOLIC PANEL    Imaging Review Dg Chest 2 View  11/06/2013   CLINICAL DATA:  Chest pain with history of coronary artery disease and stent placement  EXAM: CHEST  2 VIEW  COMPARISON:  PA and lateral chest x-ray of March 17, 2012  FINDINGS: The lungs are adequately inflated. There is no focal infiltrate. The heart is top-normal in size but stable. The pulmonary vascularity is not engorged. The mediastinum is normal in width. There is no pleural effusion or pneumothorax. The bony thorax is unremarkable.  IMPRESSION: There is no acute cardiopulmonary abnormality.   Electronically Signed   By: David  [place]   On: 11/06/2013 12:21   Ct Head Wo Contrast  11/06/2013   CLINICAL DATA:  Blurred vision, left arm paresthesias  EXAM: CT HEAD WITHOUT CONTRAST  TECHNIQUE: Contiguous axial images were obtained from the base of the skull through the vertex without contrast.  COMPARISON:  04/06/2011  FINDINGS: Normal appearance of the intracranial structures. No evidence for acute hemorrhage, mass lesion, midline shift, hydrocephalus or large infarct. No acute bony abnormality. The visualized sinuses are clear.  IMPRESSION: No acute intracranial abnormality.   Electronically Signed   By: Ruel Favors M.D.   On: 11/06/2013 12:14     EKG Interpretation None      MDM   Final  diagnoses:  None    Pt presenting with c/o chest pain with radiation down left arm and left jaw, also feels that her vision is blurry.  Visual acuity 20/40 in OD, 20/40, bilateral eyes.  Pt will need to have vision checked, she has an otherwise normal neuro exam, low suspicion for ACS.  Pt to be seen by cardiology as she has hx of MI in the past, initial workup normal. Pt is wanting to leave AMA due to childcare issues, she may not stay even if cards recommends admission, but awaiting their input at this time.      Ethelda Chick, MD 11/06/13 [phone]

## 2013-11-06 NOTE — ED Notes (Signed)
MD at bedside. 

## 2013-11-06 NOTE — H&P (Addendum)
Cardiologist:  Dr. Darcey Nora [name] is an 48 y.o. female.   Chief Complaint: CP  HPI:  The patient is a 48 yo female, who works two jobs, is going to school and raising her grandchild, with a history of CAD, HTN, bipolar DO, GERD, dyslipidemia, tobacco abuse, ETOH abuse, obesity.  She had NSTEMI 8/12 tx with DES to [facility] and DES to pRCA;  oLAD 25%, m-dLAD 25-30%, mLAD 40%, D1 30%, mCFX 99%, pOM1 25%, pRCA 80% and 70%, oPDA 25%, EF 60%;  echo 8/12: EF 55-60%, mod LVH.  Up until May 2014 she was taking Effient, lopressor 75 bid, zocor,  HCTZ  according to Dr. Winnifred Friar last note.  Compliance has been an issue.   She reports having CP which comes and goes for 1-2 months. Today the pain was 8/10 early this morning(~0700hrs), with blurry vision in her right eye and radiation to neck and jaw which prompted her ER visit.  Some SOB and Nausea.  No diaphoresis.  It appears to be worse after eating.  + mild abd pain.  She has noticed worsening SOB with exertion, like going up stairs but does not necessarily have CP going up the stairs.   She is pain free now.  She says she takes ASA, lexapro and depakote. Nothing else.   Her granddaughter tells her she stops breathing when sleeping at night, snores terribly and is sleepy during the day a lot.   Medications: Prior to Admission medications   Medication Sig Start Date End Date Taking? Authorizing Provider  divalproex (DEPAKOTE ER) 500 MG 24 hr tablet Take 1,000 mg by mouth daily. For mood control   Yes Historical Provider, MD  escitalopram (LEXAPRO) 20 MG tablet Take 20 mg by mouth daily.   Yes Historical Provider, MD  hydrochlorothiazide (MICROZIDE) 12.5 MG capsule Take 12.5 mg by mouth daily.   Yes Historical Provider, MD  metoprolol tartrate (LOPRESSOR) 25 MG tablet Take 50 mg by mouth 2 (two) times daily.   Yes Historical Provider, MD  penicillin v potassium (VEETID) 500 MG tablet Take 1 tablet (500 mg total) by mouth 4 (four) times daily. 10/30/13  Yes  Alvina Chou, PA-C      Past Medical History  Diagnosis Date  . CAD (coronary artery disease)     a. NSTEMI 8/12 tx with DES to [facility] and DES to [place];  Cardiac cath 09/29/10: oLAD 25%, m-dLAD 25-30%, mLAD 40%, D1 30%, mCFX 99%, pOM1 25%, pRCA 80% and 70%, oPDA 25%, EF 60%;  echo 8/12: EF 55-60%, mod LVH  . Hypertension   . Bipolar disorder   . GERD (gastroesophageal reflux disease)   . Dyslipidemia   . Tobacco abuse   . History of alcohol abuse   . Myocardial infarction     Sep 28, 2010    Past Surgical History  Procedure Laterality Date  . Cardiac catheterization    . Coronary angioplasty with stent placement      Family History  Problem Relation Age of Onset  . Hypertension Mother   . Mental illness Mother    Social History:  reports that she has been smoking Cigarettes.  She has been smoking about 1.00 pack per day. She does not have any smokeless tobacco history on file. She reports that she uses illicit drugs (Benzodiazepines and Opium). She reports that she does not drink alcohol.  Allergies:  Allergies  Allergen Reactions  . Codeine Nausea And Vomiting  . Vicodin [Hydrocodone-Acetaminophen] Nausea And Vomiting     (  Not in a hospital admission)  Results for orders placed during the hospital encounter of 11/06/13 (from the past 48 hour(s))  CBC     Status: Abnormal   Collection Time    11/06/13 10:45 AM      Result Value Ref Range   WBC 5.9  4.0 - 10.5 K/uL   RBC 4.39  3.87 - 5.11 MIL/uL   Hemoglobin 12.0  12.0 - 15.0 g/dL   HCT 37.4  36.0 - 46.0 %   MCV 85.2  78.0 - 100.0 fL   MCH 27.3  26.0 - 34.0 pg   MCHC 32.1  30.0 - 36.0 g/dL   RDW 16.2 (*) 11.5 - 15.5 %   Platelets 301  150 - 400 K/uL  TROPONIN I     Status: None   Collection Time    11/06/13 10:45 AM      Result Value Ref Range   Troponin I <0.30  <0.30 ng/mL   Comment:            Due to the release kinetics of cTnI,     a negative result within the first hours     of the onset of  symptoms does not rule out     myocardial infarction with certainty.     If myocardial infarction is still suspected,     repeat the test at appropriate intervals.  BASIC METABOLIC PANEL     Status: None   Collection Time    11/06/13 10:45 AM      Result Value Ref Range   Sodium 137  137 - 147 mEq/L   Potassium 4.4  3.7 - 5.3 mEq/L   Chloride 102  96 - 112 mEq/L   CO2 23  19 - 32 mEq/L   Glucose, Bld 94  70 - 99 mg/dL   BUN 6  6 - 23 mg/dL   Creatinine, Ser 0.63  0.50 - 1.10 mg/dL   Calcium 9.6  8.4 - 10.5 mg/dL   GFR calc non Af Amer >90  >90 mL/min   GFR calc Af Amer >90  >90 mL/min   Comment: (NOTE)     The eGFR has been calculated using the CKD EPI equation.     This calculation has not been validated in all clinical situations.     eGFR's persistently <90 mL/min signify possible Chronic Kidney     Disease.   Anion gap 12  5 - 15   Dg Chest 2 View  11/06/2013   CLINICAL DATA:  Chest pain with history of coronary artery disease and stent placement  EXAM: CHEST  2 VIEW  COMPARISON:  PA and lateral chest x-ray of March 17, 2012  FINDINGS: The lungs are adequately inflated. There is no focal infiltrate. The heart is top-normal in size but stable. The pulmonary vascularity is not engorged. The mediastinum is normal in width. There is no pleural effusion or pneumothorax. The bony thorax is unremarkable.  IMPRESSION: There is no acute cardiopulmonary abnormality.   Electronically Signed   By: David  [place]   On: 11/06/2013 12:21   Ct Head Wo Contrast  11/06/2013   CLINICAL DATA:  Blurred vision, left arm paresthesias  EXAM: CT HEAD WITHOUT CONTRAST  TECHNIQUE: Contiguous axial images were obtained from the base of the skull through the vertex without contrast.  COMPARISON:  04/06/2011  FINDINGS: Normal appearance of the intracranial structures. No evidence for acute hemorrhage, mass lesion, midline shift, hydrocephalus or large infarct. No acute bony  abnormality. The visualized sinuses  are clear.  IMPRESSION: No acute intracranial abnormality.   Electronically Signed   By: Daryll Brod M.D.   On: 11/06/2013 12:14    Review of Systems  Constitutional: Negative for fever.  HENT: Negative for congestion and sore throat.   Eyes: Positive for blurred vision (Right eye-Resolved).  Respiratory: Positive for shortness of breath. Negative for cough and wheezing.   Cardiovascular: Positive for chest pain. Negative for orthopnea, leg swelling and PND.  Gastrointestinal: Positive for nausea and abdominal pain (Mild). Negative for vomiting, diarrhea, blood in stool and melena.  Genitourinary: Negative for hematuria.  Musculoskeletal: Positive for neck pain.  Neurological: Negative for dizziness.  All other systems reviewed and are negative.   Blood pressure 168/97, pulse 81, temperature 98.1 F (36.7 C), temperature source Oral, resp. rate 22, last menstrual period 10/18/2013, SpO2 100.00%. Physical Exam  Nursing note and vitals reviewed. Constitutional: She is oriented to person, place, and time. She appears well-developed.  Obese  HENT:  Head: Normocephalic and atraumatic.  Mouth/Throat: No oropharyngeal exudate.  Eyes: EOM are normal. Pupils are equal, round, and reactive to light. No scleral icterus.  Neck: Normal range of motion. Neck supple. No JVD present.  Cardiovascular: Normal rate, regular rhythm, S1 normal and S2 normal.   No murmur heard. Pulses:      Radial pulses are 2+ on the right side, and 2+ on the left side.       Dorsalis pedis pulses are 2+ on the right side, and 2+ on the left side.  Respiratory: Effort normal and breath sounds normal. She has no wheezes. She has no rales.  GI: Soft. Bowel sounds are normal. She exhibits no distension. There is tenderness (Right upper quadrant).  Musculoskeletal: She exhibits no edema.  Lymphadenopathy:    She has no cervical adenopathy.  Neurological: She is alert and oriented to person, place, and time. She  exhibits normal muscle tone.  Skin: Skin is warm and dry.  Psychiatric: She has a normal mood and affect.     Assessment/Plan Principal Problem:   Chest pain Active Problems:   CAD (coronary artery disease)   Hypertension   Dyslipidemia   Tobacco abuse   Blurry vision  Plan:  Her CP certainly could be angina given her known CAD and continued risk factors.  Initial troponin was negative at 1045hrs today.  Will recheck one now.  No acute changes on the EKG.  She is CP free.  She does have some right upper quad pain and said the CP sometimes coincided with eating.  Gall bladder US may be appropriate at some point.  If she must leave, I would only do so if second troponin is negative.  She will need a nuclear stress test.   Need to make sure she is back on BP meds and statin.  ASA, lopressor, zocor.  Compliance is a problem.      Negative CT Head.   Her grandchild just arrived to the ER so she is willing to stay.  Admit, ruleout, treadmill [employer]. Will need OP sleep study  HAGER, [place], [employer] 11/06/2013, 3:02 PM

## 2013-11-06 NOTE — ED Notes (Signed)
Cardiology MD at bedside.

## 2013-11-07 ENCOUNTER — Observation Stay (HOSPITAL_COMMUNITY): Payer: MEDICAID

## 2013-11-07 ENCOUNTER — Encounter (HOSPITAL_COMMUNITY): Payer: Self-pay | Admitting: *Deleted

## 2013-11-07 ENCOUNTER — Observation Stay (HOSPITAL_COMMUNITY): Payer: Self-pay

## 2013-11-07 DIAGNOSIS — R079 Chest pain, unspecified: Secondary | ICD-10-CM

## 2013-11-07 LAB — TROPONIN I
Troponin I: <0.3 ng/mL (ref <0.30)
Troponin I: <0.3 ng/mL (ref <0.30)

## 2013-11-07 LAB — HEMOGLOBIN A1C
Hgb A1c MFr Bld: 6 % — ABNORMAL HIGH (ref <5.7)
Mean Plasma Glucose: 126 mg/dL — ABNORMAL HIGH (ref <117)

## 2013-11-07 LAB — BASIC METABOLIC PANEL
Anion gap: 10 (ref 5–15)
BUN: 9 mg/dL (ref 6–23)
CO2: 24 mEq/L (ref 19–32)
Calcium: 9.1 mg/dL (ref 8.4–10.5)
Chloride: 104 mEq/L (ref 96–112)
Creatinine, Ser: 0.7 mg/dL (ref 0.50–1.10)
GFR calc Af Amer: >90 mL/min (ref >90)
GFR calc non Af Amer: >90 mL/min (ref >90)
Glucose, Bld: 93 mg/dL (ref 70–99)
Potassium: 4.2 mEq/L (ref 3.7–5.3)
Sodium: 138 mEq/L (ref 137–147)

## 2013-11-07 MED ORDER — SODIUM CHLORIDE 0.9 % IV SOLN
1.0000 mL/kg/h | INTRAVENOUS | Status: DC
  Administered 2013-11-07: 1 mL/kg/h via INTRAVENOUS

## 2013-11-07 MED ORDER — ASPIRIN 81 MG PO CHEW
81.0000 mg | CHEWABLE_TABLET | ORAL | Status: AC
  Administered 2013-11-08: 81 mg via ORAL
  Filled 2013-11-07: qty 1

## 2013-11-07 MED ORDER — SODIUM CHLORIDE 0.9 % IJ SOLN: 3.0000 mL | INTRAMUSCULAR | Status: DC | PRN

## 2013-11-07 MED ORDER — SODIUM CHLORIDE 0.9 % IJ SOLN: 3.0000 mL | Freq: Two times a day (BID) | INTRAMUSCULAR | Status: DC

## 2013-11-07 MED ORDER — SODIUM CHLORIDE 0.9 % IV SOLN: 250.0000 mL | INTRAVENOUS | Status: DC | PRN

## 2013-11-07 MED ORDER — REGADENOSON 0.4 MG/5ML IV SOLN
INTRAVENOUS | Status: AC
  Filled 2013-11-07: qty 5

## 2013-11-07 MED ORDER — LORAZEPAM 0.5 MG PO TABS
0.5000 mg | ORAL_TABLET | Freq: Four times a day (QID) | ORAL | Status: DC | PRN
  Administered 2013-11-07 – 2013-11-08 (×4): 0.5 mg via ORAL
  Filled 2013-11-07 (×4): qty 1

## 2013-11-07 MED ORDER — TECHNETIUM TC 99M SESTAMIBI GENERIC - CARDIOLITE
10.0000 | Freq: Once | INTRAVENOUS | Status: AC | PRN
  Administered 2013-11-07: 10 via INTRAVENOUS

## 2013-11-07 MED ORDER — TECHNETIUM TC 99M SESTAMIBI GENERIC - CARDIOLITE
30.0000 | Freq: Once | INTRAVENOUS | Status: AC | PRN
  Administered 2013-11-07: 30 via INTRAVENOUS

## 2013-11-07 MED ORDER — REGADENOSON 0.4 MG/5ML IV SOLN
0.4000 mg | Freq: Once | INTRAVENOUS | Status: AC
  Administered 2013-11-07: 0.4 mg via INTRAVENOUS

## 2013-11-07 NOTE — Progress Notes (Signed)
Subjective: No further chest pain.  Objective: Vital signs in last 24 hours: Temp:  [98.1 F (36.7 C)-98.8 F (37.1 C)] 98.4 F (36.9 C) (09/30 0600) Pulse Rate:  [66-99] 66 (09/30 0600) Resp:  [13-22] 18 (09/30 0600) BP: (156-192)/(74-130) 157/102 mmHg (09/30 0600) SpO2:  [98 %-100 %] 100 % (09/30 0600) Weight:  [177 lb (80.287 kg)] 177 lb (80.287 kg) (09/30 0600) Weight change:  Last BM Date: 11/04/13 Intake/Output from previous day: 09/29 0701 - 09/30 0700 In: 240 [P.O.:240] Out: -  Intake/Output this shift:    PE: General:Pleasant affect, NAD Skin:Warm and dry, brisk capillary refill HEENT:normocephalic, sclera clear, mucus membranes moist Heart:S1S2 RRR without murmur, gallup, rub or click Lungs:clear without rales, rhonchi, or wheezes ZOX:WRUE, non tender, + BS, do not palpate liver spleen or masses Ext:no lower ext edema, 2+ pedal pulses, 2+ radial pulses Neuro:alert and oriented, MAE, follows commands, + facial symmetry   Lab Results:  Recent Labs  11/06/13 1045  WBC 5.9  HGB 12.0  HCT 37.4  PLT 301   BMET  Recent Labs  11/06/13 1045 11/07/13 0553  NA 137 138  K 4.4 4.2  CL 102 104  CO2 23 24  GLUCOSE 94 93  BUN 6 9  CREATININE 0.63 0.70  CALCIUM 9.6 9.1    Recent Labs  11/07/13 0034 11/07/13 0553  TROPONINI <0.30 <0.30    Lab Results  Component Value Date   CHOL 134 05/31/2011   HDL 45.10 05/31/2011   LDLCALC 71 05/31/2011   TRIG 92.0 05/31/2011   CHOLHDL 3 05/31/2011   Lab Results  Component Value Date   HGBA1C 6.0* 11/06/2013     Lab Results  Component Value Date   TSH 1.220 11/06/2013         Studies/Results: Dg Chest 2 View  11/06/2013   CLINICAL DATA:  Chest pain with history of coronary artery disease and stent placement  EXAM: CHEST  2 VIEW  COMPARISON:  PA and lateral chest x-ray of March 17, 2012  FINDINGS: The lungs are adequately inflated. There is no focal infiltrate. The heart is top-normal in  size but stable. The pulmonary vascularity is not engorged. The mediastinum is normal in width. There is no pleural effusion or pneumothorax. The bony thorax is unremarkable.  IMPRESSION: There is no acute cardiopulmonary abnormality.   Electronically Signed   By: David  [place]   On: 11/06/2013 12:21   Ct Head Wo Contrast  11/06/2013   CLINICAL DATA:  Blurred vision, left arm paresthesias  EXAM: CT HEAD WITHOUT CONTRAST  TECHNIQUE: Contiguous axial images were obtained from the base of the skull through the vertex without contrast.  COMPARISON:  04/06/2011  FINDINGS: Normal appearance of the intracranial structures. No evidence for acute hemorrhage, mass lesion, midline shift, hydrocephalus or large infarct. No acute bony abnormality. The visualized sinuses are clear.  IMPRESSION: No acute intracranial abnormality.   Electronically Signed   By: Ruel Favors M.D.   On: 11/06/2013 12:14    Medications: I have reviewed the patient's current medications. Scheduled Meds: . aspirin EC  81 mg Oral Daily  . divalproex  1,000 mg Oral Daily  . escitalopram  20 mg Oral Daily  . hydrochlorothiazide  12.5 mg Oral Daily  . metoprolol tartrate  50 mg Oral BID  . simvastatin  20 mg Oral q1800   Continuous Infusions:  PRN Meds:.acetaminophen, nitroGLYCERIN, ondansetron (ZOFRAN) IV  Assessment/Plan: 48 yo female, who works  two jobs, is going to school and raising her grandchild, with a history of CAD, HTN, bipolar DO, GERD, dyslipidemia, tobacco abuse, ETOH abuse, obesity. She [facility] to [facility]CA; oLAD 25%, m-dLAD 25-30%, mLAD 40%, D1 30%, mCFX 99%, pOM1 25%, pRCA 80% and 70%, oPDA 25%, EF 60%; echo 8/12: EF 55-60%, mod LVH. Up until May 2014 she was taking Effient, lopressor 75 bid, zocor, HCTZ according to Dr. Vern ClaudeWall's last note. Compliance has been an issue. Admitted with chest pain. Also OSA.  Principal Problem:   Chest pain- negative MI for stress test today Active  Problems:   CAD (coronary artery disease) with hx stents   Hypertension- elevated   Dyslipidemia   Tobacco abuse    LOS: 1 day   Time spent with pt. :15 minutes. [facility] D[facility] Pres[facility] Medical CenterNGOLD,LAURA R  Nurse Practitioner Certified Pager [phone] or after 5pm and on weekends call [phone] 11/07/2013, 9:16 AM

## 2013-11-07 NOTE — Progress Notes (Addendum)
I reviewed the nuclear results and there appears to be a small to moderate sized lateral wall region of ischemia. This is in territory where previous stents were placed. I believe she should stay for angiography tomorrow to define anatomy and treat restenosis or new disease of present. Scheduled for 3PM with C. [employer]Alh[employer]

## 2013-11-07 NOTE — Progress Notes (Signed)
UR completed 

## 2013-11-07 NOTE — Progress Notes (Signed)
Quiet night. Having nuclear study today and if low risk we will discharge with plans for OP sleep study(a very high priority). If mod to high risk study, plan will be cath tomorrow.

## 2013-11-07 NOTE — Progress Notes (Signed)
Attempted exercise myoview but 30sec or so into stage 3 with HR only 127 and goal of 147 pt could not continue.  Test stopped and pt rested then we proceeded with lexiscan myoview.  This was completed without complications.  + chest pressure and SOB- resolved in recovery .  nuc med results to follow.  She did state that this is how she feels at home with activity, just walking up the steps and she has to stop.

## 2013-11-07 NOTE — Progress Notes (Signed)
Noted  

## 2013-11-08 ENCOUNTER — Encounter (HOSPITAL_COMMUNITY)
Admission: EM | Disposition: A | Discharge status: Home / Self Care | Payer: Self-pay | Source: Home / Self Care | Attending: Emergency Medicine

## 2013-11-08 ENCOUNTER — Telehealth: Payer: Self-pay

## 2013-11-08 DIAGNOSIS — I2510 Atherosclerotic heart disease of native coronary artery without angina pectoris: Secondary | ICD-10-CM

## 2013-11-08 DIAGNOSIS — R0789 Other chest pain: Principal | ICD-10-CM

## 2013-11-08 HISTORY — PX: LEFT HEART CATHETERIZATION WITH CORONARY ANGIOGRAM: SHX5451

## 2013-11-08 SURGERY — LEFT HEART CATHETERIZATION WITH CORONARY ANGIOGRAM
Anesthesia: LOCAL

## 2013-11-08 MED ORDER — NITROGLYCERIN 1 MG/10 ML FOR IR/CATH LAB
INTRA_ARTERIAL | Status: AC
  Filled 2013-11-08: qty 10

## 2013-11-08 MED ORDER — FAMOTIDINE IN NACL 20-0.9 MG/50ML-% IV SOLN
INTRAVENOUS | Status: AC
  Filled 2013-11-08: qty 50

## 2013-11-08 MED ORDER — SIMVASTATIN 20 MG PO TABS
20.0000 mg | ORAL_TABLET | Freq: Every day | ORAL | Status: DC
Start: 2013-11-08 — End: 2013-11-22

## 2013-11-08 MED ORDER — MIDAZOLAM HCL 2 MG/2ML IJ SOLN
INTRAMUSCULAR | Status: AC
  Filled 2013-11-08: qty 2

## 2013-11-08 MED ORDER — HEPARIN (PORCINE) IN NACL 2-0.9 UNIT/ML-% IJ SOLN
INTRAMUSCULAR | Status: AC
  Filled 2013-11-08: qty 1000

## 2013-11-08 MED ORDER — METHYLPREDNISOLONE SODIUM SUCC 125 MG IJ SOLR
INTRAMUSCULAR | Status: AC
  Filled 2013-11-08: qty 2

## 2013-11-08 MED ORDER — DIPHENHYDRAMINE HCL 50 MG/ML IJ SOLN
INTRAMUSCULAR | Status: AC
  Filled 2013-11-08: qty 1

## 2013-11-08 MED ORDER — FENTANYL CITRATE 0.05 MG/ML IJ SOLN
INTRAMUSCULAR | Status: AC
  Filled 2013-11-08: qty 2

## 2013-11-08 MED ORDER — LIDOCAINE HCL (PF) 1 % IJ SOLN
INTRAMUSCULAR | Status: AC
  Filled 2013-11-08: qty 30

## 2013-11-08 NOTE — Discharge Summary (Signed)
The patient has widely patent stents. She has a false positive myocardial perfusion study. The immediate significant medical issue is determining whether or not she has sleep apnea. This test will be performed as soon as possible. The node is accurate as dictated.

## 2013-11-08 NOTE — Progress Notes (Signed)
I spoke to the patient this morning concerning the findings of her nuclear perfusion study. The study is moderate to high risk with a region of severe ischemia in the lateral wall. Given her age and prior PCI/stent in this region, coronary angiography is indicated to define the presence or absence of restenosis versus de novo disease. If she receives further intervention she will spend the night and be discharged tomorrow. If no intervention is required, we'll be able to discharge her today and remember that an outpatient sleep study is imperative.

## 2013-11-08 NOTE — Discharge Summary (Signed)
CARDIOLOGY DISCHARGE SUMMARY   Patient ID: [name] MRN: [id number] DOB/AGE: [birth year] 48 y.o.  Admit date: 11/06/2013 Discharge date: 11/08/2013  PCP: Janet Headings, MD Primary Cardiologist: Dr. Katrinka Blazing  Primary Discharge Diagnosis:   Chest pain  Secondary Discharge Diagnosis:    CAD (coronary artery disease)   Hypertension   Dyslipidemia   Tobacco abuse  Procedures: Cardiac catheterization, coronary arteriogram, left ventriculogram, Lexi scan Myoview, CT of the head without contrast  Hospital Course: [name] is a 48 y.o. female with a history of CAD. On the day of admission, she had prolonged chest pain that was worse than usual and radiated to her neck and jaw. It was associated with blurred vision. She came to the emergency room and was admitted for further evaluation and treatment.  Her cardiac enzymes were negative for MI. Because of the blurred vision, she had a head CT but it showed no acute abnormality. Her volume status was good and her labs did not show any acute abnormality or infection. She has reported problems with heavy snoring, reports of her stopping breathing and daytime somnolence. There is concern for sleep apnea and she has an appointment for evaluation of this in 2 weeks.  Her cardiac enzymes were negative for MI. A stress test was felt appropriate for further evaluation. She had a Lexi scan nuclear stress test on 11/07/2013. Results are below. There was a reversible area consistent with ischemia and the study was felt to be high risk. She was held over for heart catheterization.  On 11/08/2013, she had a cardiac catheterization. Results are below. Her previously placed stents were patent and there was no other critical disease. Because of her history of dyslipidemia and CAD, she was started on a statin. Part of her attenuation, she had a hemoglobin A1c which was borderline abnormal at 6.0. This is to be followed as an outpatient.  Post-cath, she  had hemostasis at her right radial cath site and was ambulating without chest pain or shortness of breath. No further inpatient work up was indicated and she is considered stable for discharge, to follow up as an outpatient.  Labs:  Lab Results  Component Value Date   WBC 5.9 11/06/2013   HGB 12.0 11/06/2013   HCT 37.4 11/06/2013   MCV 85.2 11/06/2013   PLT 301 11/06/2013     Recent Labs Lab 11/07/13 0553  NA 138  K 4.2  CL 104  CO2 24  BUN 9  CREATININE 0.70  CALCIUM 9.1  GLUCOSE 93    Recent Labs  11/06/13 1925 11/07/13 0034 11/07/13 0553  TROPONINI <0.30 <0.30 <0.30    Radiology: Dg Chest 2 View 11/06/2013   CLINICAL DATA:  Chest pain with history of coronary artery disease and stent placement  EXAM: CHEST  2 VIEW  COMPARISON:  PA and lateral chest x-ray of March 17, 2012  FINDINGS: The lungs are adequately inflated. There is no focal infiltrate. The heart is top-normal in size but stable. The pulmonary vascularity is not engorged. The mediastinum is normal in width. There is no pleural effusion or pneumothorax. The bony thorax is unremarkable.  IMPRESSION: There is no acute cardiopulmonary abnormality.   Electronically Signed   By: David  [place]   On: 11/06/2013 12:21   Ct Head Wo Contrast 11/06/2013   CLINICAL DATA:  Blurred vision, left arm paresthesias  EXAM: CT HEAD WITHOUT CONTRAST  TECHNIQUE: Contiguous axial images were obtained from the base of the skull through the vertex  without contrast.  COMPARISON:  04/06/2011  FINDINGS: Normal appearance of the intracranial structures. No evidence for acute hemorrhage, mass lesion, midline shift, hydrocephalus or large infarct. No acute bony abnormality. The visualized sinuses are clear.  IMPRESSION: No acute intracranial abnormality.   Electronically Signed   By: Ruel Favorsrevor  Shick M.D.   On: 11/06/2013 12:14   Nm Myocar Multi W/spect W/wall Motion / Ef 11/07/2013   CLINICAL DATA:  Ms.  [name] is a 48 yo with chest pain.  EXAM:  MYOCARDIAL IMAGING WITH SPECT (REST AND PHARMACOLOGIC-STRESS)  GATED LEFT VENTRICULAR WALL MOTION STUDY  LEFT VENTRICULAR EJECTION FRACTION  TECHNIQUE: Standard myocardial SPECT imaging was performed after resting intravenous injection of 10 mCi Tc-5092m sestamibi. Subsequently, intravenous infusion of Lexiscan was performed under the supervision of the Cardiology staff. At peak effect of the drug, 30 mCi Tc-8592m sestamibi was injected intravenously and standard myocardial SPECT imaging was performed. Quantitative gated imaging was also performed to evaluate left ventricular wall motion, and estimate left ventricular ejection fraction.  COMPARISON:  None.  FINDINGS: There is lateral T wave inversion at baseline. There are no significant changes to suggest ischemia.  Raw date:  No significant motion artifact.  Stress images: There is a medium sized defect in the mid -basal lateral wall with normal uptake in other areas of the myocardium. SSS = 10  Rest images: Smooth and normal uptake in all areas of the LV. SRS = 3  This is consistent with a medium sized reversible defect in the mid-basal lateral wall. SDS = 7.  QGS reveals no segmental wall motion abnormalities  Left Ventricular Ejection Fraction: 50 %  End diastolic volume 87 ml  End systolic volume 43 ml  IMPRESSION: 1. There is a medium size area of reversible ischemia in the mid-basal lateral wall.  2.  The LV function is at the lower limits of normal  3. Left ventricular ejection fraction 50%  4.  High risk Lexiscan Myoview.  *2012 Appropriate Use Criteria for Coronary Revascularization Focused Update: J Am Coll Cardiol. 2012;59(9):857-881. http://content.dementiazones.comonlinejacc.org/article.aspx?articleid=1201161   Electronically Signed   By: Kristeen MissPhilip  Nahser   On: 11/07/2013 15:47   Cardiac Cath: 11/08/2013 HEMODYNAMICS:  AO SYSTOLIC/AO DIASTOLIC: 153/81  LV SYSTOLIC/LV DIASTOLIC: 174/14  ANGIOGRAPHIC RESULTS:  1. Left main; normal  2. LAD; the first diagonal branch  was moderate in length and small in caliber. It had a 60% segmental mid stenosis. There was a 30-40% mid LAD stenosis after the diabetic takeoff, 50-60% mid to distal LAD stenosis not significantly different from her prior cath  3. Left circumflex; the mid AV groove circumflex that was widely patent. The continuation of the circumflex posterior lateral branch had minor irregularities.  4. Right coronary artery; dominant with a widely patent proximal stent  5. Left ventriculography; RAO left ventriculogram was performed using  25 mL of Visipaque dye at 12 mL/second. The overall LVEF estimated  60 % Without wall motion abnormalities  IMPRESSION:Ms. Pratt has widely patent stents in her mid AV groove circumflex and proximal dominant RCA with otherwise noncritical CAD and normal LV function. I think her Myoview stress test was false positive. Medical therapy will be recommended.  The sheath was removed and a CRP and was placed on the right wrist to achieve patent hemostasis. The patient left the lab in stable condition. She'll be gently hydrated and discharged home later today.   EKG: 11/08/2013 Sinus rhythm, no acute ischemic changes   FOLLOW UP PLANS AND APPOINTMENTS Allergies  Allergen  Reactions  . Codeine Nausea And Vomiting  . Vicodin [Hydrocodone-Acetaminophen] Nausea And Vomiting     Medication List         divalproex 500 MG 24 hr tablet  Commonly known as:  DEPAKOTE ER  Take 1,000 mg by mouth daily. For mood control     escitalopram 20 MG tablet  Commonly known as:  LEXAPRO  Take 20 mg by mouth daily.     hydrochlorothiazide 12.5 MG capsule  Commonly known as:  MICROZIDE  Take 12.5 mg by mouth daily.     metoprolol tartrate 25 MG tablet  Commonly known as:  LOPRESSOR  Take 50 mg by mouth 2 (two) times daily.     penicillin v potassium 500 MG tablet  Commonly known as:  VEETID  Take 1 tablet (500 mg total) by mouth 4 (four) times daily.     simvastatin 20 MG tablet    Commonly known as:  ZOCOR  Take 1 tablet (20 mg total) by mouth daily at 6 PM.        Discharge Instructions   Diet - low sodium heart healthy    Complete by:  As directed      Increase activity slowly    Complete by:  As directed           Follow-up Information   Follow up with MCCUEN,CHRISTINE L, MD. Schedule an appointment as soon as possible for a visit in 2 days.   Specialty:  Ophthalmology   Contact information:   [address] [place] [address] [phone]       Follow up with [facility]. Schedule an appointment as soon as possible for a visit in 2 days.   Contact information:   [facility] [address] [place] [address] [phone]       Follow up with Janet Headings, MD.   Specialty:  Internal Medicine   Contact information:   [address], [address] [place] [address] [phone]       Follow up with Barbaraann Share, MD On 11/22/2013. (Evaluation for sleep study at 9:45 am, please arrive about 15 minutes early for paperwork)    Specialty:  Pulmonary Disease   Contact information:   [address] [place] [address] [phone]       Follow up with [facility], NP On 12/07/2013. (See for Dr. Katrinka Blazing at 8:00 am)    Specialty:  Cardiology   Contact information:   [address] [place] [address] [phone]       BRING ALL MEDICATIONS WITH YOU TO FOLLOW UP APPOINTMENTS  Time spent with patient to include physician time: 42 min Signed: Theodore Demark, PA-C 11/08/2013, 4:41 PM Co-Sign MD

## 2013-11-08 NOTE — CV Procedure (Signed)
[name] is a 48 y.o. female    [id number] LOCATION:  FACILITY: [facility]  PHYSICIAN: Janet BattyJonathan Jaryiah [name], M.D. 07-Aug-1965   DATE OF PROCEDURE:  11/08/2013  DATE OF DISCHARGE:     CARDIAC CATHETERIZATION     History obtained from chart review.[name] is a 48 year-old [place] female with a history of CAD status post stenting of her proximal RCA and mid AV groove circumflex in 2012. She has positive cardiac risk factors and was admitted with unstable angina. She ruled out for myocardial infarction. A Myoview stress test suggested lateral ischemia. She presents now for diagnostic coronary angiography to define her anatomy.   PROCEDURE DESCRIPTION:   The patient was brought to the second floor [facility] cath lab in the postabsorptive state. She was premedicated with Valium 5 mg by mouth, IV Versed, fentanyl, Pepcid, Benadryl and Solu-Medrol.. Her right wristwas prepped and shaved in usual sterile fashion. Xylocaine 1% was used for local anesthesia. A 6 French sheath was inserted into the right radial  artery using standard Seldinger technique. The patient received 5000 units  of heparin  intravenously.  If 5 [place] TIG catheter and pigtail catheter were used for selective coronary angiography and left ventriculography respectively. Omnipaque dye was used for the entirety of the case. Retrograde aortic, left ventricular and pullback pressures were recorded. Radial cocktail was administered the SideArm sheath    HEMODYNAMICS:    AO SYSTOLIC/AO DIASTOLIC: 153/81   LV SYSTOLIC/LV DIASTOLIC: 174/14  ANGIOGRAPHIC RESULTS:   1. Left main; normal  2. LAD; the first diagonal branch was moderate in length and small in caliber. It had a 60% segmental mid stenosis. There was a 30-40% mid LAD stenosis after the diabetic takeoff, 50-60% mid to distal LAD stenosis not significantly different from her prior cath 3. Left circumflex; the mid AV groove circumflex that was widely patent.  The continuation of the circumflex posterior lateral branch had minor irregularities.  4. Right coronary artery; dominant with a widely patent proximal stent 5. Left ventriculography; RAO left ventriculogram was performed using  25 mL of Visipaque dye at 12 mL/second. The overall LVEF estimated  60 % Without wall motion abnormalities  IMPRESSION:[name] has widely patent stents in her mid AV groove circumflex and proximal dominant RCA with otherwise noncritical CAD and normal LV function. I think her Myoview stress test was false positive. Medical therapy will be recommended. The sheath was removed and a CRP and was placed on the right wrist to achieve patent hemostasis. The patient left the lab in stable condition. She'll be gently hydrated and discharged home later today.   [name],Janet Sampley J. MD, [facility] 11/08/2013 12:06 PM

## 2013-11-08 NOTE — Telephone Encounter (Signed)
Spoke with rhonda with cardiology an given appt for sleep consult with Dr Shelle Ironlance

## 2013-11-13 ENCOUNTER — Other Ambulatory Visit: Payer: Self-pay

## 2013-11-13 MED ORDER — METOPROLOL TARTRATE 25 MG PO TABS
50.0000 mg | ORAL_TABLET | Freq: Two times a day (BID) | ORAL | Status: DC
Start: 2013-11-13 — End: 2013-12-07

## 2013-11-22 ENCOUNTER — Encounter: Payer: Self-pay | Admitting: Pulmonary Disease

## 2013-11-22 ENCOUNTER — Ambulatory Visit (INDEPENDENT_AMBULATORY_CARE_PROVIDER_SITE_OTHER): Payer: Self-pay | Admitting: Pulmonary Disease

## 2013-11-22 VITALS — BP 132/86 | HR 73 | Temp 97.0°F | Ht 61.0 in | Wt 188.2 lb

## 2013-11-22 DIAGNOSIS — G4733 Obstructive sleep apnea (adult) (pediatric): Secondary | ICD-10-CM | POA: Insufficient documentation

## 2013-11-22 HISTORY — DX: Obstructive sleep apnea (adult) (pediatric): G47.33

## 2013-11-22 NOTE — Progress Notes (Signed)
Subjective:    Patient ID: Janet [name], female    DOB: 1965/02/20, 48 y.o.   MRN: [id number]  HPI The patient is a 48 year old female who I've been asked to see for possible obstructive sleep apnea.  She has been noted to have loud snoring, as well as an abnormal breathing pattern during sleep. The patient is unsure if she has frequent awakenings during the night, but does not feel rested upon arising. She does work third shift, and has class a few days a week during the mornings as well. The patient is also on multiple medications that can cause sedation. She admits to having some inappropriate daytime sleepiness with inactivity, but admits to getting very sleepy while trying to watch television or read. She also can get sleepy driving at times. However, her Epworth score is 0? The patient states that her weight is up 20 pounds over the last 2 years.   Sleep Questionnaire What time do you typically go to bed?( Between what hours) varies varies at 1002 on 11/22/13 by Tommie SamsMindy S Silva, CMA How long does it take you to fall asleep? 30 min 30 min at 1002 on 11/22/13 by Tommie SamsMindy S Silva, CMA How many times during the night do you wake up? What time do you get out of bed to start your day? 0705 0705 at 1002 on 11/22/13 by Tommie SamsMindy S Silva, CMA Do you drive or operate heavy machinery in your occupation? No No at 1002 on 11/22/13 by Tommie SamsMindy S Silva, CMA How much has your weight changed (up or down) over the past two years? (In pounds) 20 lb (9.072 kg) 20 lb (9.072 kg) at 1002 on 11/22/13 by Tommie SamsMindy S Silva, CMA Have you ever had a sleep study before? No No at 1002 on 11/22/13 by Tommie SamsMindy S Silva, CMA Do you currently use CPAP? No No at 1002 on 11/22/13 by Tommie SamsMindy S Silva, CMA Do you wear oxygen at any time? No    Review of Systems  Constitutional: Negative for fever and unexpected weight change.  HENT: Negative for congestion, dental problem, ear pain, nosebleeds, postnasal drip, rhinorrhea, sinus  pressure, sneezing, sore throat and trouble swallowing.   Eyes: Negative for redness and itching.  Respiratory: Positive for shortness of breath. Negative for cough, chest tightness and wheezing.   Cardiovascular: Positive for chest pain. Negative for palpitations and leg swelling.  Gastrointestinal: Negative for nausea and vomiting.  Genitourinary: Negative for dysuria.  Musculoskeletal: Negative for joint swelling.  Skin: Negative for rash.  Neurological: Negative for headaches.  Hematological: Does not bruise/bleed easily.  Psychiatric/Behavioral: Negative for dysphoric mood. The patient is not nervous/anxious.        Objective:   Physical Exam Constitutional:  Overweight female, no acute distress  HENT:  Nares patent without discharge, but large turbinates bilat  Oropharynx without exudate, palate and uvula are elongated.  Eyes:  Perrla, eomi, no scleral icterus  Neck:  No JVD, no TMG  Cardiovascular:  Normal rate, ? Slight iregular rhythm, no rubs or gallops.  No murmurs        Intact distal pulses  Pulmonary :  Normal breath sounds, no stridor or respiratory distress   No rales, rhonchi, or wheezing  Abdominal:  Soft, nondistended, bowel sounds present.  No tenderness noted.   Musculoskeletal:  No lower extremity edema noted.  Lymph Nodes:  No cervical lymphadenopathy noted  Skin:  No cyanosis noted  Neurologic:  Alert, appropriate, moves all 4 extremities without obvious deficit.  Assessment & Plan:

## 2013-11-22 NOTE — Assessment & Plan Note (Signed)
The patient has a history of loud snoring with an abnormal breathing pattern during sleep, as well as nonrestorative sleep and sleepiness during her waking hours. Her history is very suspicious for obstructive sleep apnea, and I think she does need a sleep study for evaluation. However, a lot of her sleepiness issues could be related to her third shift work combined with going to school during the day, and as well as her doses of sedating medications. I have had a long discussion with her about sleep apnea, including its impact to her quality of life and cardiovascular health. I will arrange for followup once her sleep study is complete.

## 2013-11-22 NOTE — Patient Instructions (Signed)
Will schedule you for a sleep study during the day.  Will arrange for followup once the results are available. Work on weight loss

## 2013-12-07 ENCOUNTER — Ambulatory Visit (INDEPENDENT_AMBULATORY_CARE_PROVIDER_SITE_OTHER): Payer: Self-pay | Admitting: Cardiology

## 2013-12-07 ENCOUNTER — Encounter: Payer: Self-pay | Admitting: Cardiology

## 2013-12-07 VITALS — BP 162/84 | HR 66 | Ht 61.0 in | Wt 183.8 lb

## 2013-12-07 DIAGNOSIS — E782 Mixed hyperlipidemia: Secondary | ICD-10-CM

## 2013-12-07 DIAGNOSIS — G4733 Obstructive sleep apnea (adult) (pediatric): Secondary | ICD-10-CM

## 2013-12-07 DIAGNOSIS — I2510 Atherosclerotic heart disease of native coronary artery without angina pectoris: Secondary | ICD-10-CM

## 2013-12-07 DIAGNOSIS — F3131 Bipolar disorder, current episode depressed, mild: Secondary | ICD-10-CM

## 2013-12-07 DIAGNOSIS — Z79899 Other long term (current) drug therapy: Secondary | ICD-10-CM

## 2013-12-07 DIAGNOSIS — I10 Essential (primary) hypertension: Secondary | ICD-10-CM

## 2013-12-07 DIAGNOSIS — E8881 Metabolic syndrome: Secondary | ICD-10-CM | POA: Insufficient documentation

## 2013-12-07 MED ORDER — HYDROCHLOROTHIAZIDE 12.5 MG PO CAPS: 12.5000 mg | ORAL_CAPSULE | Freq: Every day | ORAL | Status: DC

## 2013-12-07 MED ORDER — METOPROLOL TARTRATE 50 MG PO TABS: 50.0000 mg | ORAL_TABLET | Freq: Two times a day (BID) | ORAL | Status: DC

## 2013-12-07 NOTE — Assessment & Plan Note (Signed)
Stable no further chest pain.  Follow up with Dr. Katrinka BlazingSmith in 6 months, she was previous pt of Dr. Daleen SquibbWall.

## 2013-12-07 NOTE — Patient Instructions (Signed)
Your physician recommends that you schedule a follow-up appointment in: 6 Months with Dr Katrinka Blazingsmith  Your physician has recommended you make the following change in your medication: Start HCTZ 12.5 mg daily  Your physician recommends that you return for lab work in: 2 Weeks BMP  Follow-up with PCP and Ophthalmologist soon

## 2013-12-07 NOTE — Assessment & Plan Note (Signed)
To follow with PCP, discussed borderline diabetes.

## 2013-12-07 NOTE — Assessment & Plan Note (Signed)
Has seen Dr. Shelle Ironlance and to have sleep study in Nov.

## 2013-12-07 NOTE — Progress Notes (Addendum)
12/07/2013   PCP: [facility] HeadingsMACKENZIE,BRIAN, MD   Chief Complaint  Patient presents with  . Follow-up    post hospital and cath    Primary Cardiologist: Dr. Katrinka BlazingSmith- new was pt of Dr. Daleen SquibbWall.  HPI:  48 year old here today for follow up post hospital with chest pain, abnormal nuc but stable cath with patent stents in October this year.  She was found to be borderline diabetic.  She had blurred vision with neg. CT of head and is to see opthamoligist in near future.   Today no chest pain.  She complains of stabbing pain in rt finger tips and lt toes.  We discussed diabetes and neuropathy.  Instructed to follow up with PCP.   BP elevated, she has not had meds this AM, and she is not taking HCTZ.  She also has intermittent tooth ache and has just completed a round of PEN VK, she is trying to find a dentist to pull the tooth.   She has hx of MI in 2012 [facility] to p[facility]CA; oLAD 25%, m-dLAD 25-30%, mLAD 40%, D1 30%, mCFX 99%, pOM1 25%, pRCA 80% and 70%, oPDA 25%, EF 60%; echo 8/12: EF 55-60%, mod LVH- no longer on effient.  She does take ASA.    Cath `11/08/2013:  1. Left main; normal  2. LAD; the first diagonal branch was moderate in length and small in caliber. It had a 60% segmental mid stenosis. There was a 30-40% mid LAD stenosis after the diabetic takeoff, 50-60% mid to distal LAD stenosis not significantly different from her prior cath  3. Left circumflex; the mid AV groove circumflex that was widely patent. The continuation of the circumflex posterior lateral branch had minor irregularities.  4. Right coronary artery; dominant with a widely patent proximal stent  5. Left ventriculography; RAO left ventriculogram was performed using  25 mL of Visipaque dye at 12 mL/second. The overall LVEF estimated  60 % Without wall motion abnormalities   Reassured that her stents were stable.    Allergies  Allergen Reactions  . Codeine Nausea And Vomiting  . Vicodin  [Hydrocodone-Acetaminophen] Nausea And Vomiting    Current Outpatient Prescriptions  Medication Sig Dispense Refill  . aspirin 81 MG tablet Take 81 mg by mouth daily.      . divalproex (DEPAKOTE ER) 500 MG 24 hr tablet Take 1,000 mg by mouth daily. For mood control      . escitalopram (LEXAPRO) 20 MG tablet Take 20 mg by mouth daily.      . hydrochlorothiazide (MICROZIDE) 12.5 MG capsule Take 1 capsule (12.5 mg total) by mouth daily.  30 capsule  6  . metoprolol (LOPRESSOR) 50 MG tablet Take 1 tablet (50 mg total) by mouth 2 (two) times daily.  60 tablet  6   No current facility-administered medications for this visit.    Past Medical History  Diagnosis Date  . CAD (coronary artery disease)     a. [facility]I [facility];  Cardiac cath 09/29/10: oLAD 25%, m-dLAD 25-30%, mLAD 40%, D1 30%, mCFX 99%, pOM1 25%, pRCA 80% and 70%, oPDA 25%, EF 60%;  echo 8/12: EF 55-60%, mod LVH  . Hypertension   . Bipolar disorder   . GERD (gastroesophageal reflux disease)   . Dyslipidemia   . Tobacco abuse   . History of alcohol abuse   . Myocardial infarction  Sep 28, 2010    Past Surgical History  Procedure Laterality Date  . Cardiac catheterization    . Coronary angioplasty with stent placement      NFA:OZHYQMV:HQROS:General:no colds or fevers, no weight changes Skin:no rashes or ulcers HEENT:no blurred vision, no congestion CV:see HPI PUL:see HPI GI:no diarrhea constipation or melena, no indigestion GU:no hematuria, no dysuria MS:no joint pain, no claudication Neuro:no syncope, no lightheadedness Endo:borderline diabetes, no thyroid disease PSYCH: bipolar with depression, though bright affect today.  Wt Readings from Last 3 Encounters:  12/07/13 183 lb 12.8 oz (83.371 kg)  11/22/13 188 lb 3.2 oz (85.367 kg)  11/08/13 180 lb (81.647 kg)    PHYSICAL EXAM BP 162/84  Pulse 66  Ht 5\' 1"  (1.549 m)  Wt 183 lb 12.8 oz (83.371 kg)  BMI 34.75 kg/m2 General:Pleasant affect,  NAD Skin:Warm and dry, brisk capillary refill HEENT:normocephalic, sclera clear, mucus membranes moist Neck:supple, no JVD, no bruits  Heart:S1S2 RRR without murmur, gallup, rub or click Lungs:clear without rales, rhonchi, or wheezes ION:GEXBAbd:soft, non tender, + BS, do not palpate liver spleen or masses Ext:no lower ext edema, 2+ pedal pulses, 2+ radial pulses- rt radial cath site stable. Neuro:alert and oriented, MAE, follows commands, + facial symmetry  MWU:XLKGEKG:none  ASSESSMENT AND PLAN CAD (coronary artery disease) Stable no further chest pain.  Follow up with Dr. Katrinka BlazingSmith in 6 months, she was previous pt of Dr. Daleen SquibbWall.  Hypertension She has not taken meds today, will resume her HCTZ and change her lopressor to 50 mg tabs BID.    OSA (obstructive sleep apnea) Has seen Dr. Shelle Ironlance and to have sleep study in Nov.  Mixed hyperlipidemia Not taking statin  Bipolar disorder, now depressed Today no depression.  But this may play role in her taking meds.  Metabolic syndrome To follow with PCP, discussed borderline diabetes.

## 2013-12-07 NOTE — Assessment & Plan Note (Signed)
She has not taken meds today, will resume her HCTZ and change her lopressor to 50 mg tabs BID.

## 2013-12-07 NOTE — Assessment & Plan Note (Signed)
Not taking statin.  

## 2013-12-07 NOTE — Assessment & Plan Note (Signed)
Today no depression.  But this may play role in her taking meds.

## 2013-12-12 ENCOUNTER — Ambulatory Visit (HOSPITAL_BASED_OUTPATIENT_CLINIC_OR_DEPARTMENT_OTHER): Payer: Self-pay | Attending: Pulmonary Disease | Admitting: Radiology

## 2013-12-12 VITALS — Ht 61.0 in | Wt 188.0 lb

## 2013-12-12 DIAGNOSIS — G4733 Obstructive sleep apnea (adult) (pediatric): Secondary | ICD-10-CM

## 2013-12-12 DIAGNOSIS — G4730 Sleep apnea, unspecified: Secondary | ICD-10-CM | POA: Insufficient documentation

## 2013-12-12 DIAGNOSIS — G4700 Insomnia, unspecified: Secondary | ICD-10-CM

## 2013-12-12 DIAGNOSIS — I493 Ventricular premature depolarization: Secondary | ICD-10-CM | POA: Insufficient documentation

## 2013-12-21 ENCOUNTER — Other Ambulatory Visit: Payer: Self-pay

## 2013-12-21 DIAGNOSIS — G4733 Obstructive sleep apnea (adult) (pediatric): Secondary | ICD-10-CM

## 2013-12-21 NOTE — Progress Notes (Signed)
[facility] Nothing further needed

## 2013-12-21 NOTE — Sleep Study (Signed)
   NAME: [name] DATE OF BIRTH:  1965-07-08 MEDICAL RECORD NUMBER [id number]  LOCATION: [facility]  PHYSICIAN: Barbaraann ShareCLANCE,KEITH M  DATE OF STUDY: 12/12/2013  SLEEP STUDY TYPE: Nocturnal Polysomnogram               REFERRING PHYSICIAN: Clance, Maree KrabbeKeith M, MD  INDICATION FOR STUDY: Hypersomnia with sleep apnea  EPWORTH SLEEPINESS SCORE:  15 HEIGHT: 5\' 1"  (154.9 cm)  WEIGHT: 188 lb (85.276 kg)    Body mass index is 35.54 kg/(m^2).  NECK SIZE: 14 in.  MEDICATIONS: reviewed in the sleep record  SLEEP ARCHITECTURE: the patient had a total sleep time of 360 minutes with no slow-wave sleep and adequate REM. Sleep onset latency was normal, and REM onset was slightly prolonged. Sleep efficiency was excellent at 98%.  RESPIRATORY DATA: the patient was found to have 14 apneas and no obstructive hypopneas, giving her an AHI of only 2.3 events per hour. The events occurred primarily during REM, and there was loud snoring noted throughout.  OXYGEN DATA: there was transient oxygen desaturation only as low as 93%.  CARDIAC DATA: occasional PVC noted  MOVEMENT/PARASOMNIA: no significant periodic limb movements or abnormal behaviors were seen.  IMPRESSION/ RECOMMENDATION:    1) small numbers of obstructive events which do not meet the AHI criteria for the obstructive sleep apnea syndrome.  2) occasional PVC noted, but no clinically significant arrhythmias were seen.    Barbaraann ShareLANCE,KEITH M Diplomate, American Board of Sleep Medicine  ELECTRONICALLY SIGNED ON:  12/21/2013, 8:51 AM [facility] PH: [phone]   FX: [phone] ACCREDITED BY THE AMERICAN ACADEMY OF SLEEP MEDICINE

## 2013-12-21 NOTE — Progress Notes (Signed)
The pt's NPSG shows AHI less than 5/hr, but does have elevated REM AHI.  Unfortunately, insurance will not cover cpap or dental appliance based on REM AHI.  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  Let pt know that her sleep study shows that she does not have sleep apnea by the current diagnostic criteria, but does have some apnea during the night (just not enough to be called sleep apnea).  I think a lot of her sleepiness is due to her apnea, her working 3rd shift, and trying to go to class during the day.  I want her to try to work on weight loss, avoid back sleeping, and try to get as much sleep as possible during the day.   If she continues to have a lot of sleepiness, let us know.

## 2013-12-26 ENCOUNTER — Ambulatory Visit: Payer: Self-pay | Admitting: Pulmonary Disease

## 2014-01-17 ENCOUNTER — Encounter (HOSPITAL_COMMUNITY): Payer: Self-pay | Admitting: Cardiovascular Disease

## 2014-04-13 ENCOUNTER — Emergency Department (HOSPITAL_COMMUNITY)
Admission: EM | Admit: 2014-04-13 | Discharge: 2014-04-13 | Disposition: A | Discharge status: Home / Self Care | Payer: Self-pay | Attending: Emergency Medicine | Admitting: Emergency Medicine

## 2014-04-13 ENCOUNTER — Encounter (HOSPITAL_COMMUNITY): Payer: Self-pay | Admitting: *Deleted

## 2014-04-13 DIAGNOSIS — Z79899 Other long term (current) drug therapy: Secondary | ICD-10-CM | POA: Insufficient documentation

## 2014-04-13 DIAGNOSIS — Z7982 Long term (current) use of aspirin: Secondary | ICD-10-CM | POA: Insufficient documentation

## 2014-04-13 DIAGNOSIS — R51 Headache: Secondary | ICD-10-CM | POA: Insufficient documentation

## 2014-04-13 DIAGNOSIS — I2510 Atherosclerotic heart disease of native coronary artery without angina pectoris: Secondary | ICD-10-CM | POA: Insufficient documentation

## 2014-04-13 DIAGNOSIS — Z87891 Personal history of nicotine dependence: Secondary | ICD-10-CM | POA: Insufficient documentation

## 2014-04-13 DIAGNOSIS — Z9861 Coronary angioplasty status: Secondary | ICD-10-CM | POA: Insufficient documentation

## 2014-04-13 DIAGNOSIS — I159 Secondary hypertension, unspecified: Secondary | ICD-10-CM | POA: Insufficient documentation

## 2014-04-13 DIAGNOSIS — F319 Bipolar disorder, unspecified: Secondary | ICD-10-CM | POA: Insufficient documentation

## 2014-04-13 DIAGNOSIS — Z8639 Personal history of other endocrine, nutritional and metabolic disease: Secondary | ICD-10-CM | POA: Insufficient documentation

## 2014-04-13 DIAGNOSIS — K029 Dental caries, unspecified: Secondary | ICD-10-CM | POA: Insufficient documentation

## 2014-04-13 DIAGNOSIS — I252 Old myocardial infarction: Secondary | ICD-10-CM | POA: Insufficient documentation

## 2014-04-13 DIAGNOSIS — Z9889 Other specified postprocedural states: Secondary | ICD-10-CM | POA: Insufficient documentation

## 2014-04-13 MED ORDER — OXYCODONE-ACETAMINOPHEN 5-325 MG PO TABS
2.0000 | ORAL_TABLET | Freq: Once | ORAL | Status: AC
  Administered 2014-04-13: 2 via ORAL
  Filled 2014-04-13: qty 2

## 2014-04-13 MED ORDER — PENICILLIN V POTASSIUM 500 MG PO TABS: 500.0000 mg | ORAL_TABLET | Freq: Four times a day (QID) | ORAL | Status: AC

## 2014-04-13 MED ORDER — OXYCODONE-ACETAMINOPHEN 5-325 MG PO TABS: 1.0000 | ORAL_TABLET | Freq: Four times a day (QID) | ORAL | Status: DC | PRN

## 2014-04-13 MED ORDER — BUPIVACAINE HCL (PF) 0.5 % IJ SOLN
10.0000 mL | Freq: Once | INTRAMUSCULAR | Status: AC
  Administered 2014-04-13: 10 mL
  Filled 2014-04-13: qty 10

## 2014-04-13 MED ORDER — BUPIVACAINE-EPINEPHRINE (PF) 0.5% -1:200000 IJ SOLN
1.8000 mL | Freq: Once | INTRAMUSCULAR | Status: DC
  Filled 2014-04-13: qty 1.8

## 2014-04-13 MED ORDER — METOPROLOL TARTRATE 25 MG PO TABS
50.0000 mg | ORAL_TABLET | Freq: Once | ORAL | Status: AC
  Administered 2014-04-13: 50 mg via ORAL
  Filled 2014-04-13: qty 2

## 2014-04-13 NOTE — ED Notes (Signed)
Pt given a heat pack 

## 2014-04-13 NOTE — ED Notes (Signed)
Pt given pillow, warm blankets.

## 2014-04-13 NOTE — ED Notes (Signed)
Pt given warm compress per her request

## 2014-04-13 NOTE — ED Notes (Signed)
Declined W/C at D/C and was escorted to lobby by RN. 

## 2014-04-13 NOTE — ED Provider Notes (Signed)
CSN: [id number]     Arrival date & time 04/13/14  1532 History   This chart is scribed for non-physician practitioner, Junius Finner, PA-C, working with Gilda Crease, MD by Abel Presto, ED Scribe.  This patient was seen in room TR06C/TR06C and the patient's care was started 4:24 PM.     Chief Complaint  Patient presents with  . Dental Pain      Patient is a 49 y.o. female presenting with tooth pain. The history is provided by the patient. No language interpreter was used.  Dental Pain Associated symptoms: headaches   Associated symptoms: no fever    HPI Comments: [name] is a 49 y.o. female with PMHx of HTN who presents to the Emergency Department complaining of left sided dental pain with onset 2 days ago.  Pt notes pain in jaw and under tongue. Pt notes associated right side throbbing headache. Dental pain is throbbing, sharp, shooting, 10/10.  Pt denies recent Abx use. Pt has taken ibuprofen for relief. Pt takes meoprolol for BP but states she has not taken it today.  Pt has a dentist appointment for 04/26/14. Pt denies fever, nausea and vomiting.  Pt found to have elevated BP in triage, denies chest pain or SOB.  Past Medical History  Diagnosis Date  . CAD (coronary artery disease)     a. NSTEMI 8/12 tx with DES to [facility] and DES to pRCA;  Cardiac cath 09/29/10: oLAD 25%, m-dLAD 25-30%, mLAD 40%, D1 30%, mCFX 99%, pOM1 25%, pRCA 80% and 70%, oPDA 25%, EF 60%;  echo 8/12: EF 55-60%, mod LVH  . Hypertension   . Bipolar disorder   . GERD (gastroesophageal reflux disease)   . Dyslipidemia   . Tobacco abuse   . History of alcohol abuse   . Myocardial infarction     Sep 28, 2010   Past Surgical History  Procedure Laterality Date  . Cardiac catheterization    . Coronary angioplasty with stent placement    . Left heart catheterization with coronary angiogram N/A 11/08/2013    Procedure: LEFT HEART CATHETERIZATION WITH CORONARY ANGIOGRAM;  Surgeon: Runell Gess, MD;   Location: [facility] CATH LAB;  Service: Cardiovascular;  Laterality: N/A;   Family History  Problem Relation Age of Onset  . Hypertension Mother   . Mental illness Mother   . Lung cancer Father   . Breast cancer Maternal Grandmother   . Breast cancer Paternal Grandmother    History  Substance Use Topics  . Smoking status: Former Smoker -- 1.00 packs/day for 26 years    Types: Cigarettes    Quit date: 11/08/2013  . Smokeless tobacco: Not on file  . Alcohol Use: No     Comment: REMOTE USE   OB History    No data available     Review of Systems  Constitutional: Negative for fever.  HENT: Positive for dental problem.   Gastrointestinal: Negative for nausea and vomiting.  Neurological: Positive for headaches.  All other systems reviewed and are negative.     Allergies  Codeine and Vicodin  Home Medications   Prior to Admission medications   Medication Sig Start Date End Date Taking? Authorizing Provider  aspirin 81 MG tablet Take 81 mg by mouth daily.    Historical Provider, MD  divalproex (DEPAKOTE ER) 500 MG 24 hr tablet Take 1,000 mg by mouth daily. For mood control    Historical Provider, MD  escitalopram (LEXAPRO) 20 MG tablet Take 20 mg by mouth  daily.    Historical Provider, MD  hydrochlorothiazide (MICROZIDE) 12.5 MG capsule Take 1 capsule (12.5 mg total) by mouth daily. 12/07/13   Leone BrandLaura R Ingold, NP  metoprolol (LOPRESSOR) 50 MG tablet Take 1 tablet (50 mg total) by mouth 2 (two) times daily. 12/07/13   Leone BrandLaura R Ingold, NP  oxyCODONE-acetaminophen (PERCOCET/ROXICET) 5-325 MG per tablet Take 1-2 tablets by mouth every 6 (six) hours as needed for severe pain. 04/13/14   Junius FinnerErin O'Malley, PA-C  penicillin v potassium (VEETID) 500 MG tablet Take 1 tablet (500 mg total) by mouth 4 (four) times daily. 04/13/14 04/20/14  Junius FinnerErin O'Malley, PA-C   BP 194/95 mmHg  Pulse 77  Temp(Src) 97.6 F (36.4 C) (Oral)  Resp 18  SpO2 98%  LMP 04/11/2014 Physical Exam  Constitutional: She is  oriented to person, place, and time. She appears well-developed and well-nourished.  HENT:  Head: Normocephalic and atraumatic.  Dental decay on the gumline and tooth #31 and #30 without gingival abscess; tender to touch; no discharge or bleeding; no airway involvement.   Eyes: EOM are normal.  Neck: Normal range of motion.  Cardiovascular: Normal rate.   Pulmonary/Chest: Effort normal.  Musculoskeletal: Normal range of motion.  Neurological: She is alert and oriented to person, place, and time.  Skin: Skin is warm and dry.  Psychiatric: She has a normal mood and affect. Her behavior is normal.  Nursing note and vitals reviewed.   ED Course  Procedures   NERVE BLOCK Performed by: Junius Finner'MALLEY, Krystalynn Ridgeway A. Consent: Verbal consent obtained. Required items: required blood products, implants, devices, and special equipment available Time out: Immediately prior to procedure a "time out" was called to verify the correct patient, procedure, equipment, support staff and site/side marked as required.  Indication: dental pain Nerve block body site: right lower jawline  Preparation: Patient was prepped and draped in the usual sterile fashion. Needle gauge: 27 G Location technique: anatomical landmarks  Local anesthetic: bupivacaine 0.5%  Anesthetic total: 1 ml  Outcome: pain improved Patient tolerance: Patient tolerated the procedure well with no immediate complications.   DIAGNOSTIC STUDIES: Oxygen Saturation is 98% on room air, normal by my interpretation.    COORDINATION OF CARE: 4:29 PM Discussed treatment plan with patient at beside, the patient agrees with the plan and has no further questions at this time.   Labs Review Labs Reviewed - No data to display  Imaging Review No results found.   EKG Interpretation None      MDM   Final diagnoses:  Pain due to dental caries  Dental decay  Secondary hypertension, unspecified   Pt with hx of HTN presenting to ED with c/o  dental pain. Upon arrival BP was 211/110, pt c/o right sided headache but denied CP or SOB. Pt given her home dose of BP medication as well as percocet for pain. Pain improved to 6/10, BP improved to 194/95.  Pt requested dental block.   Dental block performed w/o immediate complication. Pain improved significantly. Pt stated she was comfortable being discharged home.  Pt plans to f/u with dentist next week for tooth removal. Return precautions provided. Pt verbalized understanding and agreement with tx plan.   I personally performed the services described in this documentation, which was scribed in my presence. The recorded information has been reviewed and is accurate.     Junius Finnerrin O'Malley, PA-C 04/13/14 1759  Gilda Creasehristopher J. Pollina, MD 04/13/14 [phone]

## 2014-04-13 NOTE — ED Notes (Signed)
Pt reports dental pain started 2 days ago. Pt hAS A DENTAL APPT ON 3-18

## 2014-06-03 ENCOUNTER — Emergency Department (HOSPITAL_COMMUNITY)
Admission: EM | Admit: 2014-06-03 | Discharge: 2014-06-03 | Disposition: A | Discharge status: Home / Self Care | Payer: Self-pay | Attending: Emergency Medicine | Admitting: Emergency Medicine

## 2014-06-03 ENCOUNTER — Encounter (HOSPITAL_COMMUNITY): Payer: Self-pay | Admitting: Family Medicine

## 2014-06-03 DIAGNOSIS — F319 Bipolar disorder, unspecified: Secondary | ICD-10-CM | POA: Insufficient documentation

## 2014-06-03 DIAGNOSIS — Z79899 Other long term (current) drug therapy: Secondary | ICD-10-CM | POA: Insufficient documentation

## 2014-06-03 DIAGNOSIS — I2510 Atherosclerotic heart disease of native coronary artery without angina pectoris: Secondary | ICD-10-CM | POA: Insufficient documentation

## 2014-06-03 DIAGNOSIS — IMO0001 Reserved for inherently not codable concepts without codable children: Secondary | ICD-10-CM

## 2014-06-03 DIAGNOSIS — Z8719 Personal history of other diseases of the digestive system: Secondary | ICD-10-CM | POA: Insufficient documentation

## 2014-06-03 DIAGNOSIS — Z9889 Other specified postprocedural states: Secondary | ICD-10-CM | POA: Insufficient documentation

## 2014-06-03 DIAGNOSIS — Z8639 Personal history of other endocrine, nutritional and metabolic disease: Secondary | ICD-10-CM | POA: Insufficient documentation

## 2014-06-03 DIAGNOSIS — Z9861 Coronary angioplasty status: Secondary | ICD-10-CM | POA: Insufficient documentation

## 2014-06-03 DIAGNOSIS — I10 Essential (primary) hypertension: Secondary | ICD-10-CM | POA: Insufficient documentation

## 2014-06-03 DIAGNOSIS — Z87891 Personal history of nicotine dependence: Secondary | ICD-10-CM | POA: Insufficient documentation

## 2014-06-03 DIAGNOSIS — R030 Elevated blood-pressure reading, without diagnosis of hypertension: Secondary | ICD-10-CM

## 2014-06-03 DIAGNOSIS — I252 Old myocardial infarction: Secondary | ICD-10-CM | POA: Insufficient documentation

## 2014-06-03 DIAGNOSIS — M722 Plantar fascial fibromatosis: Secondary | ICD-10-CM

## 2014-06-03 DIAGNOSIS — Z7982 Long term (current) use of aspirin: Secondary | ICD-10-CM | POA: Insufficient documentation

## 2014-06-03 LAB — CBC WITH DIFFERENTIAL/PLATELET
Basophils Absolute: 0 10*3/uL (ref 0.0–0.1)
Basophils Relative: 1 % (ref 0–1)
Eosinophils Absolute: 0.1 10*3/uL (ref 0.0–0.7)
Eosinophils Relative: 4 % (ref 0–5)
HCT: 39.4 % (ref 36.0–46.0)
Hemoglobin: 12.6 g/dL (ref 12.0–15.0)
Lymphocytes Relative: 37 % (ref 12–46)
Lymphs Abs: 1.5 10*3/uL (ref 0.7–4.0)
MCH: 26.8 pg (ref 26.0–34.0)
MCHC: 32 g/dL (ref 30.0–36.0)
MCV: 83.8 fL (ref 78.0–100.0)
Monocytes Absolute: 0.4 10*3/uL (ref 0.1–1.0)
Monocytes Relative: 10 % (ref 3–12)
Neutro Abs: 2 10*3/uL (ref 1.7–7.7)
Neutrophils Relative %: 48 % (ref 43–77)
Platelets: 240 10*3/uL (ref 150–400)
RBC: 4.7 MIL/uL (ref 3.87–5.11)
RDW: 15 % (ref 11.5–15.5)
WBC: 4 10*3/uL (ref 4.0–10.5)

## 2014-06-03 LAB — BASIC METABOLIC PANEL
Anion gap: 8 (ref 5–15)
BUN: 9 mg/dL (ref 6–23)
CO2: 23 mmol/L (ref 19–32)
Calcium: 9.1 mg/dL (ref 8.4–10.5)
Chloride: 106 mmol/L (ref 96–112)
Creatinine, Ser: 0.71 mg/dL (ref 0.50–1.10)
GFR calc Af Amer: >90 mL/min (ref >90)
GFR calc non Af Amer: >90 mL/min (ref >90)
Glucose, Bld: 93 mg/dL (ref 70–99)
Potassium: 3.9 mmol/L (ref 3.5–5.1)
Sodium: 137 mmol/L (ref 135–145)

## 2014-06-03 LAB — CBG MONITORING, ED: Glucose-Capillary: 96 mg/dL (ref 70–99)

## 2014-06-03 MED ORDER — NAPROXEN 500 MG PO TABS: 500.0000 mg | ORAL_TABLET | Freq: Two times a day (BID) | ORAL | Status: DC

## 2014-06-03 MED ORDER — METOPROLOL TARTRATE 25 MG PO TABS
50.0000 mg | ORAL_TABLET | Freq: Once | ORAL | Status: AC
  Administered 2014-06-03: 50 mg via ORAL
  Filled 2014-06-03: qty 2

## 2014-06-03 NOTE — Discharge Instructions (Signed)
It is very important for you to take your blood pressure medications as prescribed.

## 2014-06-03 NOTE — ED Provider Notes (Signed)
CSN: [id number]     Arrival date & time 06/03/14  [id number] History   First MD Initiated Contact with Patient 06/03/14 [phone]     Chief Complaint  Patient presents with  . Numbness  . Hypertension     (Consider location/radiation/quality/duration/timing/severity/associated sxs/prior Treatment) HPI Comments: Patient with a history of HTN presents today with bilateral foot pain.  She reports that she has a feeling of pins and needles on the bottom of her feet for the past month.  She reports that the pain becomes worse with ambulating and is often worse in the morning.  She denies any acute injury or trauma.  She has not been taking anything for her pain.   No erythema or swelling of the feet.  She also has a history of HTN.  She states that she has not taken her antihypertensive medications in a couple of days.  She has the medication at home she states that she just hasn't taken it.  She denies any headache, vomiting, chest pain, SOB, vision changes, weakness, abdominal pain, or back pain.  No history of DM.    The history is provided by the patient.    Past Medical History  Diagnosis Date  . CAD (coronary artery disease)     [facility];  Cardiac cath 09/29/10: oLAD 25%, m-dLAD 25-30%, mLAD 40%, D1 30%, mCFX 99%, pOM1 25%, pRCA 80% and 70%, oPDA 25%, EF 60%;  echo 8/12: EF 55-60%, mod LVH  . Hypertension   . Bipolar disorder   . GERD (gastroesophageal reflux disease)   . Dyslipidemia   . Tobacco abuse   . History of alcohol abuse   . Myocardial infarction     Sep 28, 2010   Past Surgical History  Procedure Laterality Date  . Cardiac catheterization    . Coronary angioplasty with stent placement    . Left heart catheterization with coronary angiogram N/A 11/08/2013    Procedure: LEFT HEART CATHETERIZATION WITH CORONARY ANGIOGRAM;  Surgeon: Runell GessJonathan J Berry, MD;  Location: [facility] CATH LAB;  Service: Cardiovascular;  Laterality: N/A;   Family History   Problem Relation Age of Onset  . Hypertension Mother   . Mental illness Mother   . Lung cancer Father   . Breast cancer Maternal Grandmother   . Breast cancer Paternal Grandmother    History  Substance Use Topics  . Smoking status: Former Smoker -- 1.00 packs/day for 26 years    Types: Cigarettes    Quit date: 11/08/2013  . Smokeless tobacco: Not on file  . Alcohol Use: No     Comment: REMOTE USE   OB History    No data available     Review of Systems  All other systems reviewed and are negative.     Allergies  Codeine and Vicodin  Home Medications   Prior to Admission medications   Medication Sig Start Date End Date Taking? Authorizing Provider  aspirin 81 MG tablet Take 81 mg by mouth daily.   Yes Historical Provider, MD  divalproex (DEPAKOTE ER) 500 MG 24 hr tablet Take 1,000 mg by mouth daily. For mood control   Yes Historical Provider, MD  escitalopram (LEXAPRO) 20 MG tablet Take 20 mg by mouth daily.   Yes Historical Provider, MD  metoprolol (LOPRESSOR) 50 MG tablet Take 1 tablet (50 mg total) by mouth 2 (two) times daily. 12/07/13  Yes Leone BrandLaura R Ingold, NP  hydrochlorothiazide (MICROZIDE) 12.5 MG capsule  Take 1 capsule (12.5 mg total) by mouth daily. Patient not taking: Reported on 06/03/2014 12/07/13   Leone Brand, NP  oxyCODONE-acetaminophen (PERCOCET/ROXICET) 5-325 MG per tablet Take 1-2 tablets by mouth every 6 (six) hours as needed for severe pain. Patient not taking: Reported on 06/03/2014 04/13/14   Junius Finner, PA-C   BP 203/96 mmHg  Pulse 78  Temp(Src) 97.8 F (36.6 C) (Oral)  Resp 11  SpO2 100% Physical Exam  Constitutional: She appears well-developed and well-nourished.  HENT:  Head: Normocephalic and atraumatic.  Mouth/Throat: Oropharynx is clear and moist.  Eyes: EOM are normal. Pupils are equal, round, and reactive to light.  Neck: Normal range of motion. Neck supple.  Cardiovascular: Normal rate, regular rhythm and normal heart sounds.    Pulses:      Dorsalis pedis pulses are 2+ on the right side, and 2+ on the left side.  Pulmonary/Chest: Effort normal and breath sounds normal.  Musculoskeletal: Normal range of motion.  Tenderness to palpation of the plantar fascia bilaterally.  No erythema, warmth, or swelling of the feet bilaterally  Neurological: She is alert. She has normal strength. No cranial nerve deficit or sensory deficit. Coordination and gait normal.  Distal sensation of both feet intact  Skin: Skin is warm and dry.  Psychiatric: She has a normal mood and affect.  Nursing note and vitals reviewed.   ED Course  Procedures (including critical care time) Labs Review Labs Reviewed  CBC WITH DIFFERENTIAL/PLATELET  BASIC METABOLIC PANEL  CBG MONITORING, ED    Imaging Review No results found.   EKG Interpretation None     Today's Vitals   06/03/14 1015 06/03/14 1130 06/03/14 1144 06/03/14 1229  BP: 192/98 176/76 176/76 176/78  Pulse: 62 66 70 66  Temp:      TempSrc:      Resp: SpO2: 99% 100%  100%  PainSc:    6    MDM   Final diagnoses:  None   Patient presents today with a complaint of bilateral foot pain that has been present for the past month.  Pain located over the plantar fascia.  No acute injury or trauma.  No signs of infection.  Neurovascularly intact.  Suspect Plantar Fasciitis. Patient also found to be hypertensive in Triage.  She has a history of HTN and has not taken her antihypertensive medication in the past couple of days.  Labs unremarkable.  Normal neurological exam.  She denies CP, HA, or vision changes.  No evidence of Hypertensive Emergency.  Blood pressure improved in the ED.      Santiago Glad, PA-C 06/04/14 2308  Glynn Octave, MD 06/05/14 [phone]

## 2014-06-03 NOTE — ED Notes (Signed)
Pt having bilateral feet numbness and heel pain that started 3 weeks ago. Pt hypertensive at triage and didn't take medication this am.

## 2014-06-07 ENCOUNTER — Telehealth: Payer: Self-pay | Admitting: *Deleted

## 2014-06-07 NOTE — Telephone Encounter (Signed)
Patient called in stating that she has never been told what her sleep study results were from November, 2015. Informed her that I would reach out to both the Sleep Study Lab RRT and the Pulmonary MD and ask them to follow up.  Message and request routed to Dr. Marcelyn BruinsKeith Clance and [place] SinkVernon Barksdale, RRT.

## 2014-06-07 NOTE — Telephone Encounter (Signed)
Pt WAS called with her results on 12/21/13, and an actual apptm made to review with her.  She never showed for apptm.   Mindy, please see note 12/21/13, and read to her again.  Thanks.

## 2014-06-07 NOTE — Telephone Encounter (Signed)
[facility] and also pt NS [facility] Barbaraann ShareKeith M Clance, MD at 12/21/2013 8:58 AM     Status: Signed       Expand All Collapse All   The pt's NPSG shows AHI less than 5/hr, but does have elevated REM AHI. Unfortunately, insurance will not cover cpap or dental appliance based on REM AHI. 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  Let pt know that her sleep study shows that she does not have sleep apnea by the current diagnostic criteria, but does have some apnea during the night (just not enough to be called sleep apnea). I think a lot of her sleepiness is due to her apnea, her working 3rd shift, and trying to go to class during the day. I want her to try to work on weight loss, avoid back sleeping, and try to get as much sleep as possible during the day. If she continues to have a lot of sleepiness, let us know.             Tommie SamsMindy S Silva, CMA at 12/21/2013 11:15 AM     Status: Signed       Expand All Collapse All   Called pt. [facility] at 4:30. Nothing further needed      ---  Called spoke with pt. Made her aware of results. She did not want to schedule appt at this time.

## 2014-07-26 ENCOUNTER — Ambulatory Visit: Payer: Self-pay | Admitting: Interventional Cardiology

## 2014-07-29 ENCOUNTER — Telehealth: Payer: Self-pay | Admitting: *Deleted

## 2014-07-29 NOTE — Telephone Encounter (Signed)
need fm status, unable to reach pt... 

## 2014-07-31 ENCOUNTER — Ambulatory Visit (INDEPENDENT_AMBULATORY_CARE_PROVIDER_SITE_OTHER): Payer: Self-pay | Admitting: Interventional Cardiology

## 2014-07-31 DIAGNOSIS — I2510 Atherosclerotic heart disease of native coronary artery without angina pectoris: Secondary | ICD-10-CM

## 2014-07-31 DIAGNOSIS — E785 Hyperlipidemia, unspecified: Secondary | ICD-10-CM

## 2014-07-31 DIAGNOSIS — Z720 Tobacco use: Secondary | ICD-10-CM

## 2014-07-31 DIAGNOSIS — I10 Essential (primary) hypertension: Secondary | ICD-10-CM

## 2014-07-31 DIAGNOSIS — G4733 Obstructive sleep apnea (adult) (pediatric): Secondary | ICD-10-CM

## 2014-07-31 NOTE — Progress Notes (Signed)
Failed to show for appointment.

## 2014-08-01 ENCOUNTER — Encounter: Payer: Self-pay | Admitting: Interventional Cardiology

## 2014-08-06 ENCOUNTER — Encounter: Payer: Self-pay | Admitting: Interventional Cardiology

## 2014-08-24 ENCOUNTER — Inpatient Hospital Stay (HOSPITAL_COMMUNITY): Payer: Medicaid Other

## 2014-08-24 ENCOUNTER — Inpatient Hospital Stay (HOSPITAL_COMMUNITY)
Admission: EM | Admit: 2014-08-24 | Discharge: 2014-09-16 | DRG: 956 | Disposition: A | Discharge status: Skilled Nursing Facility | Payer: Medicaid Other | Attending: General Surgery | Admitting: General Surgery

## 2014-08-24 ENCOUNTER — Emergency Department (HOSPITAL_COMMUNITY): Payer: Medicaid Other

## 2014-08-24 ENCOUNTER — Inpatient Hospital Stay (HOSPITAL_COMMUNITY): Payer: Medicaid Other | Admitting: Anesthesiology

## 2014-08-24 ENCOUNTER — Encounter (HOSPITAL_COMMUNITY): Payer: Self-pay | Admitting: Anesthesiology

## 2014-08-24 ENCOUNTER — Emergency Department (HOSPITAL_COMMUNITY): Payer: Medicaid Other | Admitting: Certified Registered Nurse Anesthetist

## 2014-08-24 ENCOUNTER — Other Ambulatory Visit: Payer: Self-pay

## 2014-08-24 ENCOUNTER — Encounter (HOSPITAL_COMMUNITY): Admission: EM | Disposition: A | Discharge status: Skilled Nursing Facility | Payer: Self-pay | Source: Home / Self Care

## 2014-08-24 DIAGNOSIS — F319 Bipolar disorder, unspecified: Secondary | ICD-10-CM | POA: Diagnosis present

## 2014-08-24 DIAGNOSIS — Y92410 Unspecified street and highway as the place of occurrence of the external cause: Secondary | ICD-10-CM | POA: Diagnosis not present

## 2014-08-24 DIAGNOSIS — T07XXXA Unspecified multiple injuries, initial encounter: Secondary | ICD-10-CM

## 2014-08-24 DIAGNOSIS — S82891C Other fracture of right lower leg, initial encounter for open fracture type IIIA, IIIB, or IIIC: Secondary | ICD-10-CM

## 2014-08-24 DIAGNOSIS — J9690 Respiratory failure, unspecified, unspecified whether with hypoxia or hypercapnia: Secondary | ICD-10-CM | POA: Diagnosis present

## 2014-08-24 DIAGNOSIS — Z978 Presence of other specified devices: Secondary | ICD-10-CM | POA: Diagnosis present

## 2014-08-24 DIAGNOSIS — S82201B Unspecified fracture of shaft of right tibia, initial encounter for open fracture type I or II: Secondary | ICD-10-CM | POA: Diagnosis not present

## 2014-08-24 DIAGNOSIS — S82831A Other fracture of upper and lower end of right fibula, initial encounter for closed fracture: Secondary | ICD-10-CM | POA: Diagnosis present

## 2014-08-24 DIAGNOSIS — J9601 Acute respiratory failure with hypoxia: Secondary | ICD-10-CM | POA: Diagnosis not present

## 2014-08-24 DIAGNOSIS — S72352A Displaced comminuted fracture of shaft of left femur, initial encounter for closed fracture: Secondary | ICD-10-CM | POA: Diagnosis present

## 2014-08-24 DIAGNOSIS — L0889 Other specified local infections of the skin and subcutaneous tissue: Secondary | ICD-10-CM | POA: Diagnosis present

## 2014-08-24 DIAGNOSIS — S72491C Other fracture of lower end of right femur, initial encounter for open fracture type IIIA, IIIB, or IIIC: Principal | ICD-10-CM | POA: Diagnosis present

## 2014-08-24 DIAGNOSIS — M542 Cervicalgia: Secondary | ICD-10-CM

## 2014-08-24 DIAGNOSIS — Z955 Presence of coronary angioplasty implant and graft: Secondary | ICD-10-CM | POA: Diagnosis not present

## 2014-08-24 DIAGNOSIS — S06899A Other specified intracranial injury with loss of consciousness of unspecified duration, initial encounter: Secondary | ICD-10-CM | POA: Diagnosis not present

## 2014-08-24 DIAGNOSIS — I2510 Atherosclerotic heart disease of native coronary artery without angina pectoris: Secondary | ICD-10-CM | POA: Diagnosis present

## 2014-08-24 DIAGNOSIS — IMO0002 Reserved for concepts with insufficient information to code with codable children: Secondary | ICD-10-CM | POA: Diagnosis present

## 2014-08-24 DIAGNOSIS — S92011B Displaced fracture of body of right calcaneus, initial encounter for open fracture: Secondary | ICD-10-CM | POA: Diagnosis present

## 2014-08-24 DIAGNOSIS — S9304XA Dislocation of right ankle joint, initial encounter: Secondary | ICD-10-CM | POA: Diagnosis present

## 2014-08-24 DIAGNOSIS — R609 Edema, unspecified: Secondary | ICD-10-CM

## 2014-08-24 DIAGNOSIS — S72401B Unspecified fracture of lower end of right femur, initial encounter for open fracture type I or II: Secondary | ICD-10-CM | POA: Diagnosis present

## 2014-08-24 DIAGNOSIS — Z8674 Personal history of sudden cardiac arrest: Secondary | ICD-10-CM

## 2014-08-24 DIAGNOSIS — Z4659 Encounter for fitting and adjustment of other gastrointestinal appliance and device: Secondary | ICD-10-CM

## 2014-08-24 DIAGNOSIS — J939 Pneumothorax, unspecified: Secondary | ICD-10-CM

## 2014-08-24 DIAGNOSIS — E876 Hypokalemia: Secondary | ICD-10-CM | POA: Diagnosis not present

## 2014-08-24 DIAGNOSIS — J9811 Atelectasis: Secondary | ICD-10-CM | POA: Diagnosis not present

## 2014-08-24 DIAGNOSIS — J189 Pneumonia, unspecified organism: Secondary | ICD-10-CM

## 2014-08-24 DIAGNOSIS — R739 Hyperglycemia, unspecified: Secondary | ICD-10-CM | POA: Diagnosis not present

## 2014-08-24 DIAGNOSIS — L03119 Cellulitis of unspecified part of limb: Secondary | ICD-10-CM | POA: Diagnosis present

## 2014-08-24 DIAGNOSIS — S92251B Displaced fracture of navicular [scaphoid] of right foot, initial encounter for open fracture: Secondary | ICD-10-CM | POA: Diagnosis present

## 2014-08-24 DIAGNOSIS — S92009B Unspecified fracture of unspecified calcaneus, initial encounter for open fracture: Secondary | ICD-10-CM

## 2014-08-24 DIAGNOSIS — J982 Interstitial emphysema: Secondary | ICD-10-CM | POA: Diagnosis present

## 2014-08-24 DIAGNOSIS — F17210 Nicotine dependence, cigarettes, uncomplicated: Secondary | ICD-10-CM | POA: Diagnosis present

## 2014-08-24 DIAGNOSIS — E8770 Fluid overload, unspecified: Secondary | ICD-10-CM | POA: Diagnosis not present

## 2014-08-24 DIAGNOSIS — D62 Acute posthemorrhagic anemia: Secondary | ICD-10-CM | POA: Diagnosis not present

## 2014-08-24 DIAGNOSIS — G931 Anoxic brain damage, not elsewhere classified: Secondary | ICD-10-CM | POA: Diagnosis present

## 2014-08-24 DIAGNOSIS — S82145A Nondisplaced bicondylar fracture of left tibia, initial encounter for closed fracture: Secondary | ICD-10-CM | POA: Diagnosis present

## 2014-08-24 DIAGNOSIS — S0990XA Unspecified injury of head, initial encounter: Secondary | ICD-10-CM

## 2014-08-24 DIAGNOSIS — Z9889 Other specified postprocedural states: Secondary | ICD-10-CM

## 2014-08-24 DIAGNOSIS — I469 Cardiac arrest, cause unspecified: Secondary | ICD-10-CM | POA: Diagnosis present

## 2014-08-24 DIAGNOSIS — Z9289 Personal history of other medical treatment: Secondary | ICD-10-CM

## 2014-08-24 DIAGNOSIS — Z0189 Encounter for other specified special examinations: Secondary | ICD-10-CM

## 2014-08-24 DIAGNOSIS — I10 Essential (primary) hypertension: Secondary | ICD-10-CM | POA: Diagnosis present

## 2014-08-24 DIAGNOSIS — S299XXA Unspecified injury of thorax, initial encounter: Secondary | ICD-10-CM

## 2014-08-24 DIAGNOSIS — B9689 Other specified bacterial agents as the cause of diseases classified elsewhere: Secondary | ICD-10-CM | POA: Diagnosis present

## 2014-08-24 DIAGNOSIS — K5900 Constipation, unspecified: Secondary | ICD-10-CM | POA: Diagnosis present

## 2014-08-24 DIAGNOSIS — S7291XA Unspecified fracture of right femur, initial encounter for closed fracture: Secondary | ICD-10-CM | POA: Diagnosis present

## 2014-08-24 DIAGNOSIS — I252 Old myocardial infarction: Secondary | ICD-10-CM | POA: Diagnosis not present

## 2014-08-24 DIAGNOSIS — S82201A Unspecified fracture of shaft of right tibia, initial encounter for closed fracture: Secondary | ICD-10-CM | POA: Diagnosis present

## 2014-08-24 DIAGNOSIS — Z419 Encounter for procedure for purposes other than remedying health state, unspecified: Secondary | ICD-10-CM

## 2014-08-24 DIAGNOSIS — S92901B Unspecified fracture of right foot, initial encounter for open fracture: Secondary | ICD-10-CM | POA: Diagnosis present

## 2014-08-24 DIAGNOSIS — R40243 Glasgow coma scale score 3-8: Secondary | ICD-10-CM | POA: Diagnosis present

## 2014-08-24 DIAGNOSIS — D692 Other nonthrombocytopenic purpura: Secondary | ICD-10-CM

## 2014-08-24 DIAGNOSIS — T1490XA Injury, unspecified, initial encounter: Secondary | ICD-10-CM

## 2014-08-24 DIAGNOSIS — S7292XA Unspecified fracture of left femur, initial encounter for closed fracture: Secondary | ICD-10-CM

## 2014-08-24 DIAGNOSIS — S92191B Other fracture of right talus, initial encounter for open fracture: Secondary | ICD-10-CM | POA: Diagnosis present

## 2014-08-24 DIAGNOSIS — G9340 Encephalopathy, unspecified: Secondary | ICD-10-CM

## 2014-08-24 DIAGNOSIS — R52 Pain, unspecified: Secondary | ICD-10-CM

## 2014-08-24 DIAGNOSIS — S82143A Displaced bicondylar fracture of unspecified tibia, initial encounter for closed fracture: Secondary | ICD-10-CM

## 2014-08-24 DIAGNOSIS — R000 Tachycardia, unspecified: Secondary | ICD-10-CM | POA: Diagnosis present

## 2014-08-24 DIAGNOSIS — R0602 Shortness of breath: Secondary | ICD-10-CM

## 2014-08-24 DIAGNOSIS — S82142A Displaced bicondylar fracture of left tibia, initial encounter for closed fracture: Secondary | ICD-10-CM | POA: Diagnosis present

## 2014-08-24 DIAGNOSIS — S82401A Unspecified fracture of shaft of right fibula, initial encounter for closed fracture: Secondary | ICD-10-CM | POA: Diagnosis present

## 2014-08-24 DIAGNOSIS — S76121A Laceration of right quadriceps muscle, fascia and tendon, initial encounter: Secondary | ICD-10-CM | POA: Diagnosis present

## 2014-08-24 HISTORY — DX: Reserved for concepts with insufficient information to code with codable children: IMO0002

## 2014-08-24 HISTORY — DX: Acute myocardial infarction, unspecified: I21.9

## 2014-08-24 HISTORY — PX: EXTERNAL FIXATION LEG: SHX1549

## 2014-08-24 HISTORY — DX: Cardiac murmur, unspecified: R01.1

## 2014-08-24 LAB — BLOOD GAS, ARTERIAL
Acid-base deficit: 4.9 mmol/L — ABNORMAL HIGH (ref 0.0–2.0)
Bicarbonate: 20.7 mEq/L (ref 20.0–24.0)
Drawn by: 28340
FIO2: 1 %
MECHVT: 500 mL
O2 Saturation: 99.1 %
PEEP: 5 cmH2O
Patient temperature: 97.5
RATE: 12 resp/min
TCO2: 22.2 mmol/L (ref 0–100)
pCO2 arterial: 44.6 mmHg (ref 35.0–45.0)
pH, Arterial: 7.286 — ABNORMAL LOW (ref 7.350–7.450)
pO2, Arterial: 244 mmHg — ABNORMAL HIGH (ref 80.0–100.0)

## 2014-08-24 LAB — CDS SEROLOGY

## 2014-08-24 LAB — I-STAT CHEM 8, ED
BUN: 15 mg/dL (ref 6–20)
Calcium, Ion: 1.12 mmol/L (ref 1.12–1.23)
Chloride: 109 mmol/L (ref 101–111)
Creatinine, Ser: 1.2 mg/dL — ABNORMAL HIGH (ref 0.44–1.00)
Glucose, Bld: 197 mg/dL — ABNORMAL HIGH (ref 65–99)
HCT: 42 % (ref 36.0–46.0)
Hemoglobin: 14.3 g/dL (ref 12.0–15.0)
Potassium: 4.5 mmol/L (ref 3.5–5.1)
Sodium: 140 mmol/L (ref 135–145)
TCO2: 17 mmol/L (ref 0–100)

## 2014-08-24 LAB — COMPREHENSIVE METABOLIC PANEL
ALT: 223 U/L — ABNORMAL HIGH (ref 14–54)
AST: 329 U/L — ABNORMAL HIGH (ref 15–41)
Albumin: 3.2 g/dL — ABNORMAL LOW (ref 3.5–5.0)
Alkaline Phosphatase: 69 U/L (ref 38–126)
Anion gap: 14 (ref 5–15)
BUN: 11 mg/dL (ref 6–20)
CO2: 15 mmol/L — ABNORMAL LOW (ref 22–32)
Calcium: 8.6 mg/dL — ABNORMAL LOW (ref 8.9–10.3)
Chloride: 110 mmol/L (ref 101–111)
Creatinine, Ser: 1.19 mg/dL — ABNORMAL HIGH (ref 0.44–1.00)
GFR calc Af Amer: >60 mL/min (ref >60)
GFR calc non Af Amer: 53 mL/min — ABNORMAL LOW (ref >60)
Glucose, Bld: 203 mg/dL — ABNORMAL HIGH (ref 65–99)
Potassium: 4.6 mmol/L (ref 3.5–5.1)
Sodium: 139 mmol/L (ref 135–145)
Total Bilirubin: 0.9 mg/dL (ref 0.3–1.2)
Total Protein: 5.9 g/dL — ABNORMAL LOW (ref 6.5–8.1)

## 2014-08-24 LAB — CBC
HCT: 39.2 % (ref 36.0–46.0)
HCT: 40.4 % (ref 36.0–46.0)
Hemoglobin: 12.4 g/dL (ref 12.0–15.0)
Hemoglobin: 13.8 g/dL (ref 12.0–15.0)
MCH: 27.6 pg (ref 26.0–34.0)
MCH: 29.7 pg (ref 26.0–34.0)
MCHC: 31.6 g/dL (ref 30.0–36.0)
MCHC: 34.2 g/dL (ref 30.0–36.0)
MCV: 87.3 fL (ref 78.0–100.0)
MCV: 87.1 fL (ref 78.0–100.0)
Platelets: 231 10*3/uL (ref 150–400)
Platelets: 97 10*3/uL — ABNORMAL LOW (ref 150–400)
RBC: 4.49 MIL/uL (ref 3.87–5.11)
RBC: 4.64 MIL/uL (ref 3.87–5.11)
RDW: 14.6 % (ref 11.5–15.5)
RDW: 14.3 % (ref 11.5–15.5)
WBC: 15.7 10*3/uL — ABNORMAL HIGH (ref 4.0–10.5)
WBC: 9.5 10*3/uL (ref 4.0–10.5)

## 2014-08-24 LAB — I-STAT ARTERIAL BLOOD GAS, ED
Acid-base deficit: 6 mmol/L — ABNORMAL HIGH (ref 0.0–2.0)
Bicarbonate: 22.7 mEq/L (ref 20.0–24.0)
O2 Saturation: 100 %
Patient temperature: 98.7
TCO2: 24 mmol/L (ref 0–100)
pCO2 arterial: 59.1 mmHg (ref 35.0–45.0)
pH, Arterial: 7.192 — CL (ref 7.350–7.450)
pO2, Arterial: 227 mmHg — ABNORMAL HIGH (ref 80.0–100.0)

## 2014-08-24 LAB — POCT I-STAT 7, (LYTES, BLD GAS, ICA,H+H)
Acid-base deficit: 9 mmol/L — ABNORMAL HIGH (ref 0.0–2.0)
Bicarbonate: 19 mEq/L — ABNORMAL LOW (ref 20.0–24.0)
Calcium, Ion: 1.07 mmol/L — ABNORMAL LOW (ref 1.12–1.23)
HCT: 22 % — ABNORMAL LOW (ref 36.0–46.0)
Hemoglobin: 7.5 g/dL — ABNORMAL LOW (ref 12.0–15.0)
O2 Saturation: 100 %
Patient temperature: 35.5
Potassium: 3.1 mmol/L — ABNORMAL LOW (ref 3.5–5.1)
Sodium: 143 mmol/L (ref 135–145)
TCO2: 21 mmol/L (ref 0–100)
pCO2 arterial: 50 mmHg — ABNORMAL HIGH (ref 35.0–45.0)
pH, Arterial: 7.179 — CL (ref 7.350–7.450)
pO2, Arterial: 433 mmHg — ABNORMAL HIGH (ref 80.0–100.0)

## 2014-08-24 LAB — PROTIME-INR
INR: 1.28 (ref 0.00–1.49)
Prothrombin Time: 16.1 seconds — ABNORMAL HIGH (ref 11.6–15.2)

## 2014-08-24 LAB — TRIGLYCERIDES: Triglycerides: 109 mg/dL (ref <150)

## 2014-08-24 LAB — I-STAT CG4 LACTIC ACID, ED: Lactic Acid, Venous: 6.48 mmol/L (ref 0.5–2.0)

## 2014-08-24 LAB — ETHANOL: Alcohol, Ethyl (B): <5 mg/dL (ref <5)

## 2014-08-24 LAB — ABO/RH: ABO/RH(D): A POS

## 2014-08-24 SURGERY — EXTERNAL FIXATION, LOWER EXTREMITY
Anesthesia: General | Laterality: Bilateral

## 2014-08-24 MED ORDER — POTASSIUM CHLORIDE IN NACL 20-0.9 MEQ/L-% IV SOLN
INTRAVENOUS | Status: DC
  Administered 2014-08-24 – 2014-08-29 (×10): via INTRAVENOUS
  Filled 2014-08-24 (×16): qty 1000

## 2014-08-24 MED ORDER — ROCURONIUM BROMIDE 50 MG/5ML IV SOLN
INTRAVENOUS | Status: AC
  Filled 2014-08-24: qty 2

## 2014-08-24 MED ORDER — ETOMIDATE 2 MG/ML IV SOLN
INTRAVENOUS | Status: AC
  Filled 2014-08-24: qty 20

## 2014-08-24 MED ORDER — LIDOCAINE HCL (CARDIAC) 20 MG/ML IV SOLN
INTRAVENOUS | Status: AC
  Filled 2014-08-24: qty 10

## 2014-08-24 MED ORDER — ONDANSETRON HCL 4 MG PO TABS
4.0000 mg | ORAL_TABLET | Freq: Four times a day (QID) | ORAL | Status: DC | PRN
Start: 2014-08-24 — End: 2014-09-16

## 2014-08-24 MED ORDER — LACTATED RINGERS IV SOLN
INTRAVENOUS | Status: DC | PRN
  Administered 2014-08-24 (×2): via INTRAVENOUS

## 2014-08-24 MED ORDER — SODIUM CHLORIDE 0.9 % IV BOLUS (SEPSIS)
1000.0000 mL | Freq: Once | INTRAVENOUS | Status: AC
  Administered 2014-08-24: 1000 mL via INTRAVENOUS

## 2014-08-24 MED ORDER — ETOMIDATE 2 MG/ML IV SOLN
INTRAVENOUS | Status: AC
Start: 2014-08-24 — End: 2014-08-24
  Filled 2014-08-24: qty 10

## 2014-08-24 MED ORDER — LACTATED RINGERS IV SOLN
INTRAVENOUS | Status: DC | PRN
  Administered 2014-08-24: 17:00:00 via INTRAVENOUS

## 2014-08-24 MED ORDER — SODIUM CHLORIDE 0.9 % IJ SOLN
INTRAMUSCULAR | Status: AC
  Filled 2014-08-24: qty 10

## 2014-08-24 MED ORDER — PANTOPRAZOLE SODIUM 40 MG IV SOLR
40.0000 mg | Freq: Every day | INTRAVENOUS | Status: DC
  Administered 2014-08-24 – 2014-09-02 (×9): 40 mg via INTRAVENOUS
  Filled 2014-08-24 (×10): qty 40

## 2014-08-24 MED ORDER — PROPOFOL 10 MG/ML IV BOLUS
INTRAVENOUS | Status: AC
  Filled 2014-08-24: qty 20

## 2014-08-24 MED ORDER — ONDANSETRON HCL 4 MG/2ML IJ SOLN: 4.0000 mg | Freq: Four times a day (QID) | INTRAMUSCULAR | Status: DC | PRN

## 2014-08-24 MED ORDER — ONDANSETRON HCL 4 MG/2ML IJ SOLN
INTRAMUSCULAR | Status: AC
Start: 2014-08-24 — End: 2014-08-24
  Filled 2014-08-24: qty 2

## 2014-08-24 MED ORDER — FENTANYL CITRATE (PF) 100 MCG/2ML IJ SOLN
25.0000 ug | Freq: Once | INTRAMUSCULAR | Status: AC
  Administered 2014-08-24: 25 ug via INTRAVENOUS

## 2014-08-24 MED ORDER — SODIUM CHLORIDE 0.9 % IV SOLN
10.0000 mL/h | Freq: Once | INTRAVENOUS | Status: AC
Start: 2014-08-24 — End: 2014-08-27
  Administered 2014-08-27: 14:00:00 via INTRAVENOUS

## 2014-08-24 MED ORDER — FENTANYL CITRATE (PF) 250 MCG/5ML IJ SOLN
INTRAMUSCULAR | Status: AC
  Filled 2014-08-24: qty 5

## 2014-08-24 MED ORDER — ROCURONIUM BROMIDE 100 MG/10ML IV SOLN
INTRAVENOUS | Status: DC | PRN
  Administered 2014-08-24: 20 mg via INTRAVENOUS
  Administered 2014-08-24: 30 mg via INTRAVENOUS

## 2014-08-24 MED ORDER — ETOMIDATE 2 MG/ML IV SOLN
INTRAVENOUS | Status: AC | PRN
  Administered 2014-08-24: 20 mg via INTRAVENOUS

## 2014-08-24 MED ORDER — STERILE WATER FOR INJECTION IJ SOLN
INTRAMUSCULAR | Status: AC
  Filled 2014-08-24: qty 10

## 2014-08-24 MED ORDER — SUCCINYLCHOLINE CHLORIDE 20 MG/ML IJ SOLN
INTRAMUSCULAR | Status: AC
Start: 2014-08-24 — End: 2014-08-25
  Filled 2014-08-24: qty 1

## 2014-08-24 MED ORDER — CETYLPYRIDINIUM CHLORIDE 0.05 % MT LIQD
7.0000 mL | Freq: Four times a day (QID) | OROMUCOSAL | Status: DC
  Administered 2014-08-25 – 2014-09-16 (×86): 7 mL via OROMUCOSAL

## 2014-08-24 MED ORDER — MIDAZOLAM HCL 5 MG/5ML IJ SOLN
INTRAMUSCULAR | Status: DC | PRN
  Administered 2014-08-24: 2 mg via INTRAVENOUS

## 2014-08-24 MED ORDER — ALBUMIN HUMAN 5 % IV SOLN
25.0000 g | Freq: Once | INTRAVENOUS | Status: AC
  Administered 2014-08-24: 25 g via INTRAVENOUS
  Filled 2014-08-24: qty 500

## 2014-08-24 MED ORDER — FENTANYL CITRATE (PF) 100 MCG/2ML IJ SOLN
50.0000 ug | Freq: Once | INTRAMUSCULAR | Status: AC
  Administered 2014-08-24: 50 ug via INTRAVENOUS

## 2014-08-24 MED ORDER — ARTIFICIAL TEARS OP OINT
TOPICAL_OINTMENT | OPHTHALMIC | Status: AC
  Filled 2014-08-24: qty 3.5

## 2014-08-24 MED ORDER — ALBUMIN HUMAN 25 % IV SOLN
25.0000 g | Freq: Once | INTRAVENOUS | Status: DC
  Filled 2014-08-24: qty 100

## 2014-08-24 MED ORDER — SUCCINYLCHOLINE CHLORIDE 20 MG/ML IJ SOLN
INTRAMUSCULAR | Status: AC
  Filled 2014-08-24: qty 1

## 2014-08-24 MED ORDER — FENTANYL CITRATE (PF) 100 MCG/2ML IJ SOLN
100.0000 ug | INTRAMUSCULAR | Status: AC | PRN
  Administered 2014-08-24 – 2014-09-03 (×3): 100 ug via INTRAVENOUS
  Filled 2014-08-24 (×3): qty 2

## 2014-08-24 MED ORDER — 0.9 % SODIUM CHLORIDE (POUR BTL) OPTIME
TOPICAL | Status: DC | PRN
  Administered 2014-08-24: 1000 mL

## 2014-08-24 MED ORDER — CEFAZOLIN SODIUM-DEXTROSE 2-3 GM-% IV SOLR: 2.0000 g | Freq: Once | INTRAVENOUS | Status: DC

## 2014-08-24 MED ORDER — FENTANYL CITRATE (PF) 250 MCG/5ML IJ SOLN
INTRAMUSCULAR | Status: DC | PRN
  Administered 2014-08-24 (×2): 50 ug via INTRAVENOUS
  Administered 2014-08-24 (×2): 100 ug via INTRAVENOUS
  Administered 2014-08-24 (×4): 50 ug via INTRAVENOUS

## 2014-08-24 MED ORDER — PROPOFOL 1000 MG/100ML IV EMUL
INTRAVENOUS | Status: AC
  Filled 2014-08-24: qty 100

## 2014-08-24 MED ORDER — PROPOFOL 1000 MG/100ML IV EMUL
0.0000 ug/kg/min | INTRAVENOUS | Status: DC
  Administered 2014-08-24: 20 ug/kg/min via INTRAVENOUS
  Administered 2014-08-25: 40 ug/kg/min via INTRAVENOUS
  Administered 2014-08-25: 20 ug/kg/min via INTRAVENOUS
  Administered 2014-08-25 – 2014-08-26 (×7): 40 ug/kg/min via INTRAVENOUS
  Administered 2014-08-27: 50 ug/kg/min via INTRAVENOUS
  Administered 2014-08-27: 40 ug/kg/min via INTRAVENOUS
  Administered 2014-08-28: 20 ug/kg/min via INTRAVENOUS
  Administered 2014-08-28 (×2): 50 ug/kg/min via INTRAVENOUS
  Filled 2014-08-24 (×16): qty 100

## 2014-08-24 MED ORDER — LIDOCAINE HCL (CARDIAC) 20 MG/ML IV SOLN
INTRAVENOUS | Status: AC
Start: 2014-08-24 — End: 2014-08-25
  Filled 2014-08-24: qty 5

## 2014-08-24 MED ORDER — FENTANYL CITRATE (PF) 100 MCG/2ML IJ SOLN
INTRAMUSCULAR | Status: AC
  Filled 2014-08-24: qty 2

## 2014-08-24 MED ORDER — FENTANYL CITRATE (PF) 100 MCG/2ML IJ SOLN
100.0000 ug | INTRAMUSCULAR | Status: DC | PRN
  Administered 2014-08-25 – 2014-09-07 (×29): 100 ug via INTRAVENOUS
  Filled 2014-08-24 (×29): qty 2

## 2014-08-24 MED ORDER — ALBUMIN HUMAN 5 % IV SOLN
INTRAVENOUS | Status: AC
  Administered 2014-08-24: 12.5 g
  Filled 2014-08-24: qty 500

## 2014-08-24 MED ORDER — EPHEDRINE SULFATE 50 MG/ML IJ SOLN
INTRAMUSCULAR | Status: AC
  Filled 2014-08-24: qty 3

## 2014-08-24 MED ORDER — PANTOPRAZOLE SODIUM 40 MG PO TBEC: 40.0000 mg | DELAYED_RELEASE_TABLET | Freq: Every day | ORAL | Status: DC

## 2014-08-24 MED ORDER — PROPOFOL 10 MG/ML IV BOLUS
INTRAVENOUS | Status: DC | PRN
  Administered 2014-08-24 (×3): 20 mg via INTRAVENOUS

## 2014-08-24 MED ORDER — CEFAZOLIN SODIUM-DEXTROSE 2-3 GM-% IV SOLR
INTRAVENOUS | Status: DC | PRN
  Administered 2014-08-24: 2 g via INTRAVENOUS

## 2014-08-24 MED ORDER — MIDAZOLAM HCL 2 MG/2ML IJ SOLN
INTRAMUSCULAR | Status: AC
  Filled 2014-08-24: qty 2

## 2014-08-24 MED ORDER — ROCURONIUM BROMIDE 50 MG/5ML IV SOLN
INTRAVENOUS | Status: AC | PRN
  Administered 2014-08-24: 100 mg via INTRAVENOUS

## 2014-08-24 MED ORDER — ROCURONIUM BROMIDE 50 MG/5ML IV SOLN
INTRAVENOUS | Status: AC
Start: 2014-08-24 — End: 2014-08-24
  Filled 2014-08-24: qty 2

## 2014-08-24 MED ORDER — SODIUM CHLORIDE 0.9 % IR SOLN
Status: DC | PRN
  Administered 2014-08-24 (×2): 3000 mL

## 2014-08-24 MED ORDER — BISACODYL 10 MG RE SUPP
10.0000 mg | Freq: Every day | RECTAL | Status: DC | PRN
  Administered 2014-09-02 – 2014-09-05 (×2): 10 mg via RECTAL
  Filled 2014-08-24 (×2): qty 1

## 2014-08-24 MED ORDER — CHLORHEXIDINE GLUCONATE 0.12 % MT SOLN
15.0000 mL | Freq: Two times a day (BID) | OROMUCOSAL | Status: DC
  Administered 2014-08-24 – 2014-09-16 (×45): 15 mL via OROMUCOSAL
  Filled 2014-08-24 (×45): qty 15

## 2014-08-24 MED ORDER — SODIUM BICARBONATE 8.4 % IV SOLN
INTRAVENOUS | Status: DC | PRN
  Administered 2014-08-24 (×2): 50 meq via INTRAVENOUS

## 2014-08-24 SURGICAL SUPPLY — 55 items
BANDAGE ELASTIC 6 VELCRO ST LF (GAUZE/BANDAGES/DRESSINGS) ×8 IMPLANT
CLAMP LARGE COMBINATION ×14 IMPLANT
CLAMP LARGE MULTI PIN ×8 IMPLANT
CLAMP LG COMBINATION (Clamp) ×7 IMPLANT
CLAMP LG MULTI PIN (Clamp) ×4 IMPLANT
CLAMP ROD ATTACHMENT ×4 IMPLANT
CLAMP ROD ATTACHMENT (Clamp) ×8 IMPLANT
COVER SURGICAL LIGHT HANDLE (MISCELLANEOUS) ×3 IMPLANT
DRAPE C-ARM 42X72 X-RAY (DRAPES) ×3 IMPLANT
DRAPE ORTHO SPLIT 77X108 STRL (DRAPES) ×12
DRAPE SURG ORHT 6 SPLT 77X108 (DRAPES) ×2 IMPLANT
DRAPE U-SHAPE 47X51 STRL (DRAPES) ×5 IMPLANT
DRSG PAD ABDOMINAL 8X10 ST (GAUZE/BANDAGES/DRESSINGS) ×2 IMPLANT
DURAPREP 26ML APPLICATOR (WOUND CARE) ×1 IMPLANT
ELECT REM PT RETURN 9FT ADLT (ELECTROSURGICAL)
ELECTRODE REM PT RTRN 9FT ADLT (ELECTROSURGICAL) IMPLANT
GAUZE SPONGE 4X4 12PLY STRL (GAUZE/BANDAGES/DRESSINGS) ×3 IMPLANT
GAUZE XEROFORM 5X9 LF (GAUZE/BANDAGES/DRESSINGS) ×3 IMPLANT
GLOVE BIO SURGEON STRL SZ8 (GLOVE) ×1 IMPLANT
GLOVE ORTHO TXT STRL SZ7.5 (GLOVE) ×1 IMPLANT
GOWN STRL REUS W/ TWL LRG LVL3 (GOWN DISPOSABLE) ×1 IMPLANT
GOWN STRL REUS W/ TWL XL LVL3 (GOWN DISPOSABLE) ×4 IMPLANT
GOWN STRL REUS W/TWL LRG LVL3 (GOWN DISPOSABLE)
GOWN STRL REUS W/TWL XL LVL3 (GOWN DISPOSABLE) ×12
KIT BASIN OR (CUSTOM PROCEDURE TRAY) ×3 IMPLANT
KIT ROOM TURNOVER OR (KITS) ×3 IMPLANT
MANIFOLD NEPTUNE II (INSTRUMENTS) ×3 IMPLANT
NS IRRIG 1000ML POUR BTL (IV SOLUTION) ×3 IMPLANT
PACK ORTHO EXTREMITY (CUSTOM PROCEDURE TRAY) ×3 IMPLANT
PAD ARMBOARD 7.5X6 YLW CONV (MISCELLANEOUS) ×6 IMPLANT
PADDING CAST ABS 6INX4YD NS (CAST SUPPLIES) ×2
PADDING CAST ABS COTTON 6X4 NS (CAST SUPPLIES) IMPLANT
PADDING CAST COTTON 6X4 STRL (CAST SUPPLIES) ×1 IMPLANT
ROD CARBON FIBER ×7 IMPLANT
ROD CARBON FIBER 500MM ×2 IMPLANT
ROD CARBON FIBER 500MM (Rod) ×4 IMPLANT
ROD CRBN FBR LRG EX-FX 11X250 (Rod) ×2 IMPLANT
ROD CRBN FBR LRG EX-FX 11X300 (Rod) ×2 IMPLANT
ROD CRBN FBR LRG EX-FX 11X350 (Rod) ×2 IMPLANT
ROD CRBN FBR LRG EX-FX 11X400 (Rod) ×2 IMPLANT
SCREW SHANZ 5.0 X175MM ×7 IMPLANT
SCREW SHANZ 5.0X175MM (Screw) ×14 IMPLANT
SPONGE LAP 18X18 X RAY DECT (DISPOSABLE) ×5 IMPLANT
SUT ETHILON 2 0 FS 18 (SUTURE) ×12 IMPLANT
SUT VIC AB 0 CT1 27 (SUTURE) ×6
SUT VIC AB 0 CT1 27XBRD ANBCTR (SUTURE) IMPLANT
SUT VIC AB 1 CT1 27 (SUTURE) ×6
SUT VIC AB 1 CT1 27XBRD ANBCTR (SUTURE) IMPLANT
SUT VIC AB 2-0 CT1 27 (SUTURE) ×6
SUT VIC AB 2-0 CT1 TAPERPNT 27 (SUTURE) IMPLANT
TOWEL OR 17X24 6PK STRL BLUE (TOWEL DISPOSABLE) ×3 IMPLANT
TOWEL OR 17X26 10 PK STRL BLUE (TOWEL DISPOSABLE) ×3 IMPLANT
TRAY FOLEY W/METER SILVER 16FR (SET/KITS/TRAYS/PACK) ×2 IMPLANT
UNDERPAD 30X30 INCONTINENT (UNDERPADS AND DIAPERS) ×5 IMPLANT
WATER STERILE IRR 1000ML POUR (IV SOLUTION) ×3 IMPLANT

## 2014-08-24 NOTE — Anesthesia Preprocedure Evaluation (Addendum)
Anesthesia Evaluation   Patient unresponsive    Reviewed: Unable to perform ROS - Chart review onlyPreop documentation limited or incomplete due to emergent nature of procedure.  Airway Mallampati: Intubated       Dental   Pulmonary    Pulmonary exam normal   + intubated    Cardiovascular Normal cardiovascular examRate:Tachycardia     Neuro/Psych    GI/Hepatic   Endo/Other    Renal/GU      Musculoskeletal   Abdominal   Peds  Hematology   Anesthesia Other Findings   Reproductive/Obstetrics                            Anesthesia Physical Anesthesia Plan  ASA: IV and emergent  Anesthesia Plan: General   Post-op Pain Management:    Induction: Intravenous  Airway Management Planned:   Additional Equipment:   Intra-op Plan:   Post-operative Plan: Post-operative intubation/ventilation  Informed Consent:   Only emergency history available and History available from chart only  Plan Discussed with: CRNA, Anesthesiologist and Surgeon  Anesthesia Plan Comments:        Anesthesia Quick Evaluation
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
## 2014-08-24 NOTE — Brief Op Note (Signed)
08/24/2014  6:56 PM  PATIENT:  Dineen KidMonica Shorb  49 y.o. female  PRE-OPERATIVE DIAGNOSIS:  multiple fractures bilateral legs  POST-OPERATIVE DIAGNOSIS:  multiple fractures bilateral legs  PROCEDURE:  Procedure(s): EXTERNAL FIXATION LEG (Bilateral) Irrigation/dedridement multiple wounds right knee/leg/ankle/foot   SURGEON:  Surgeon(s) and Role:    * Kathryne Hitchhristopher Y Gianny Sabino, MD - Primary    * Scott Rise PaganiniGregory Dean, MD  ANESTHESIA:   spinal  EBL:  Total I/O In: 5655 [I.V.:3500; Blood:2155] Out: 700 [Urine:700]  COUNTS:  YES  TOURNIQUET:  * No tourniquets in log *  DICTATION: .Other Dictation: Dictation Number [id number]  PLAN OF CARE: Admit to inpatient   PATIENT DISPOSITION:  ICU - intubated and critically ill.   Delay start of Pharmacological VTE agent (>24hrs) due to surgical blood loss or risk of bleeding: no
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
## 2014-08-24 NOTE — ED Notes (Signed)
Pt received RBC O neg unit # [id number]
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
## 2014-08-24 NOTE — ED Notes (Signed)
Magnus IvanBlackman, MD at bedside
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
## 2014-08-24 NOTE — Transfer of Care (Signed)
Immediate Anesthesia Transfer of Care Note  Patient: [name]  Procedure(s) Performed: Procedure(s): EXTERNAL FIXATION LEG (Bilateral)  Patient Location: NICU  Anesthesia Type:General  Level of Consciousness: Patient remains intubated per anesthesia plan  Airway & Oxygen Therapy: Patient remains intubated per anesthesia plan and Patient placed on Ventilator (see vital sign flow sheet for setting)  Post-op Assessment: Report given to RN and Post -op Vital signs reviewed and stable  Post vital signs: Reviewed and stable  Last Vitals:  Filed Vitals:   08/24/14 1640  BP: 211/129  Pulse:   Temp:   Resp: 26    Complications: No apparent anesthesia complications

## 2014-08-24 NOTE — ED Notes (Signed)
Difficult intubation, anesthesia at bedside & completed intubation

## 2014-08-24 NOTE — ED Notes (Signed)
O neg blood infusing at this time via Level 1 infuser
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
## 2014-08-24 NOTE — OR Nursing (Addendum)
Emergency case. Unable to obtain consent from patient. Consent obtained from son after patient was brought to OR.
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
## 2014-08-24 NOTE — Progress Notes (Signed)
Spoke with Dr Janee Mornhompson about pt HR in 130's, soft BP, and right upper arm bruising and swelling. 2 of albumin ordered, and xray of right arm. Will continue to monitor.

## 2014-08-24 NOTE — Progress Notes (Signed)
Orthopedic Tech Progress Note Patient Details:  [name] 19-Sep-1965 [id number] Applied fiberglass short leg splint and fiberglass stirrup splint and Velcro knee immobilizer to RLE.  Pulses, sensation, motion intact before and after splinting.  Capillary refill less than 2 seconds before and after splinting.  Ortho Devices Type of Ortho Device: Knee splint, Post (short leg) splint Ortho Device/Splint Location: RLE Ortho Device/Splint Interventions: Application   Lesle ChrisGilliland, Imara Standiford L 08/24/2014, 3:05 PM

## 2014-08-24 NOTE — ED Notes (Signed)
Janee Mornhompson, MD  Notified re: decreased pressure, pt to receive 2 units Emergency release blood & 2 units of  FFP

## 2014-08-24 NOTE — ED Notes (Addendum)
Pt rcvd 2 units FFP prior to transport to OR, 1st unit of FFP A-pos administered @ 16:41-16:47 unit # [id number], pt rcvd 2nd FFP bag  directly before transport to OR, [id number] 16 [id number]

## 2014-08-24 NOTE — ED Notes (Signed)
Pt rcvd O neg RBC [id number], Level 1 infuser

## 2014-08-24 NOTE — ED Notes (Signed)
Per Chaplain the pts daughter has been  Notified via phone [place] the Lunette StandsChaplain is currently attempting to reach her son via phone

## 2014-08-24 NOTE — ED Notes (Signed)
Janee Mornhompson, MD & this RN accompanied pt to CT

## 2014-08-24 NOTE — Anesthesia Postprocedure Evaluation (Signed)
  Anesthesia Post-op Note  Patient: [name]  Procedure(s) Performed: Procedure(s): EXTERNAL FIXATION LEG (Bilateral)  Patient Location: SICU  Anesthesia Type:General  Level of Consciousness: sedated and Patient remains intubated per anesthesia plan  Airway and Oxygen Therapy: Patient remains intubated per anesthesia plan and Patient placed on Ventilator (see vital sign flow sheet for setting)  Post-op Pain: none  Post-op Assessment: Post-op Vital signs reviewed, Patient's Cardiovascular Status Stable, Respiratory Function Stable, Patent Airway, No signs of Nausea or vomiting and Pain level controlled              Post-op Vital Signs: stable  Last Vitals:  Filed Vitals:   08/24/14 1640  BP: 211/129  Pulse:   Temp:   Resp: 26    Complications: No apparent anesthesia complications

## 2014-08-24 NOTE — ED Notes (Signed)
Janee Mornhompson, MD notified Re: pts increase BP post emergency release blood admin, pt to receive 25 mcg Fentanyl IV & continue with FFP

## 2014-08-24 NOTE — Anesthesia Procedure Notes (Signed)
Procedure Name: Intubation Date/Time: 08/24/2014 2:31 PM Performed by: Bishop LimboARTER, Aleeya Veitch S Pre-anesthesia Checklist: Patient identified, Emergency Drugs available, Suction available, Patient being monitored and Timeout performed Patient Re-evaluated:Patient Re-evaluated prior to inductionOxygen Delivery Method: Ambu bag Preoxygenation: Pre-oxygenation with 100% oxygen Intubation Type: IV induction Ventilation: Mask ventilation without difficulty Laryngoscope Size: Glidescope and 4 Grade View: Grade I Tube type: Oral Tube size: 7.0 mm Number of attempts: 1 Airway Equipment and Method: Video-laryngoscopy and Rigid stylet Placement Confirmation: breath sounds checked- equal and bilateral,  CO2 detector and ETT inserted through vocal cords under direct vision Secured at: 21 cm Tube secured with: Tape Dental Injury: Teeth and Oropharynx as per pre-operative assessment

## 2014-08-25 LAB — BLOOD GAS, ARTERIAL
Acid-base deficit: 2 mmol/L (ref 0.0–2.0)
Bicarbonate: 22.2 mEq/L (ref 20.0–24.0)
Drawn by: 283401
FIO2: 0.6 %
MECHVT: 550 mL
O2 Saturation: 99.3 %
PEEP: 5 cmH2O
Patient temperature: 98.6
RATE: 12 resp/min
TCO2: 23.4 mmol/L (ref 0–100)
pCO2 arterial: 37.6 mmHg (ref 35.0–45.0)
pH, Arterial: 7.39 (ref 7.350–7.450)
pO2, Arterial: 236 mmHg — ABNORMAL HIGH (ref 80.0–100.0)

## 2014-08-25 LAB — BASIC METABOLIC PANEL
Anion gap: 7 (ref 5–15)
BUN: 10 mg/dL (ref 6–20)
CO2: 23 mmol/L (ref 22–32)
Calcium: 6.8 mg/dL — ABNORMAL LOW (ref 8.9–10.3)
Chloride: 113 mmol/L — ABNORMAL HIGH (ref 101–111)
Creatinine, Ser: 0.95 mg/dL (ref 0.44–1.00)
GFR calc Af Amer: >60 mL/min (ref >60)
GFR calc non Af Amer: >60 mL/min (ref >60)
Glucose, Bld: 108 mg/dL — ABNORMAL HIGH (ref 65–99)
Potassium: 3.6 mmol/L (ref 3.5–5.1)
Sodium: 143 mmol/L (ref 135–145)

## 2014-08-25 LAB — CBC
HCT: 31.3 % — ABNORMAL LOW (ref 36.0–46.0)
Hemoglobin: 11 g/dL — ABNORMAL LOW (ref 12.0–15.0)
MCH: 29.8 pg (ref 26.0–34.0)
MCHC: 35.1 g/dL (ref 30.0–36.0)
MCV: 84.8 fL (ref 78.0–100.0)
Platelets: 111 10*3/uL — ABNORMAL LOW (ref 150–400)
RBC: 3.69 MIL/uL — ABNORMAL LOW (ref 3.87–5.11)
RDW: 14.7 % (ref 11.5–15.5)
WBC: 8.2 10*3/uL (ref 4.0–10.5)

## 2014-08-25 LAB — MRSA PCR SCREENING: MRSA by PCR: NEGATIVE

## 2014-08-25 MED ORDER — SODIUM CHLORIDE 0.9 % IV SOLN
25.0000 ug/h | INTRAVENOUS | Status: DC
  Administered 2014-08-25: 50 ug/h via INTRAVENOUS
  Administered 2014-08-26 – 2014-08-27 (×2): 100 ug/h via INTRAVENOUS
  Administered 2014-08-28: 250 ug/h via INTRAVENOUS
  Administered 2014-08-28: 400 ug/h via INTRAVENOUS
  Administered 2014-08-29: 350 ug/h via INTRAVENOUS
  Administered 2014-08-29: 150 ug/h via INTRAVENOUS
  Administered 2014-08-30 – 2014-08-31 (×5): 200 ug/h via INTRAVENOUS
  Administered 2014-09-01: 225 ug/h via INTRAVENOUS
  Administered 2014-09-01: 250 ug/h via INTRAVENOUS
  Administered 2014-09-01: 225 ug/h via INTRAVENOUS
  Administered 2014-09-02: 250 ug/h via INTRAVENOUS
  Administered 2014-09-02: 175 ug/h via INTRAVENOUS
  Administered 2014-09-03: 150 ug/h via INTRAVENOUS
  Filled 2014-08-25 (×19): qty 50

## 2014-08-25 MED ORDER — FENTANYL CITRATE (PF) 100 MCG/2ML IJ SOLN
50.0000 ug | Freq: Once | INTRAMUSCULAR | Status: AC
  Administered 2014-08-25: 50 ug via INTRAVENOUS

## 2014-08-25 MED ORDER — FENTANYL BOLUS VIA INFUSION
50.0000 ug | INTRAVENOUS | Status: DC | PRN
  Administered 2014-08-27 (×2): 100 ug via INTRAVENOUS
  Administered 2014-08-27: 50 ug via INTRAVENOUS
  Administered 2014-08-27: 150 ug via INTRAVENOUS
  Administered 2014-08-27: 100 ug via INTRAVENOUS
  Filled 2014-08-25 (×6): qty 50

## 2014-08-25 NOTE — Progress Notes (Signed)
Utilization Review Completed.[name] T7/17/2016  

## 2014-08-25 NOTE — Progress Notes (Signed)
Patient ID: Dineen [name], female   DOB: 1965/04/04, 49 y.o.   MRN: [id number] In the ICU intubated and actually requiring some sedation.  Has been trying to move all extremities.  Bilateral lower extremities both with external fixation.  From an Ortho standpoint, as she continues to hopefully recover, further surgery will be needed.   1)  Right distal femur will need plate/screw construct 2)  Right tibia will need an IM rod/nail 3)  Left femur will need an IM rod/nail 4)  Left tibial plateau will need plate/screws  She does need CT scans of the right distal femur, left knee, and right ankle/foot for surgical planning purpose once she is able to leave the until for theses studies.  Will likely consult Dr. Myrene GalasMichael Handy, Ortho Traumatologist, for his input.

## 2014-08-25 NOTE — Progress Notes (Signed)
Follow up - Trauma and Critical Care  Patient Details:    [name] is an 49 y.o. female.  Lines/tubes : Airway (Active)  Secured at (cm) 21 cm 08/25/2014  8:17 AM  Measured From Lips 08/25/2014  8:17 AM  Secured Location Left 08/25/2014  8:17 AM  Secured By [employer] 08/25/2014  8:17 AM  Tube Holder Repositioned Yes 08/25/2014  8:17 AM  Cuff Pressure (cm H2O) 22 cm H2O 08/25/2014  8:17 AM  Site Condition Dry 08/25/2014  8:17 AM     Arterial Line 08/24/14 Left Brachial (Active)  Site Assessment Clean;Dry;Intact 08/24/2014  8:00 PM  Line Status Pulsatile blood flow 08/24/2014  8:00 PM  Art Line Waveform Appropriate 08/24/2014  8:00 PM  Art Line Interventions Zeroed and calibrated;Flushed per protocol;Connections checked and tightened;Leveled 08/24/2014  8:00 PM  Color/Movement/Sensation Capillary refill less than 3 sec 08/24/2014  8:00 PM  Dressing Type Transparent 08/24/2014  8:00 PM  Dressing Status Clean;Dry;Intact 08/24/2014  8:00 PM     NG/OG Tube Orogastric 16 Fr. Right mouth (Active)  Placement Verification Auscultation;Xray 08/24/2014  8:00 PM  Site Assessment Clean;Dry;Intact 08/24/2014  8:00 PM  Status Suction-low intermittent 08/24/2014  8:00 PM  Drainage Appearance Manson Passey 08/24/2014  8:00 PM     Urethral Catheter A. Collins RN Latex;Straight-tip 16 Fr. (Active)  Indication for Insertion or Continuance of Catheter Unstable critical patients (first 24-48 hours) 08/24/2014  8:00 PM  Site Assessment Clean;Intact 08/24/2014  8:00 PM  Catheter Maintenance Bag below level of bladder 08/24/2014  8:00 PM  Collection Container Standard drainage bag 08/24/2014  8:00 PM  Securement Method Leg strap 08/24/2014  8:00 PM  Urinary Catheter Interventions Unclamped 08/24/2014  8:00 PM  Output (mL) 130 mL 08/25/2014  8:34 AM    Microbiology/Sepsis markers: Results for orders placed or performed during the hospital encounter of 08/24/14  MRSA PCR Screening     Status: None   Collection  Time: 08/24/14  7:15 PM  Result Value Ref Range Status   MRSA by PCR NEGATIVE NEGATIVE Final    Comment:        The GeneXpert MRSA Assay (FDA approved for NASAL specimens only), is one component of a comprehensive MRSA colonization surveillance program. It is not intended to diagnose MRSA infection nor to guide or monitor treatment for MRSA infections.     Anti-infectives:  Anti-infectives    Start     Dose/Rate Route Frequency Ordered Stop   08/24/14 1615  ceFAZolin (ANCEF) IVPB 2 g/50 mL premix     2 g 100 mL/hr over 30 Minutes Intravenous  Once 08/24/14 1602        Best Practice/Protocols:  VTE Prophylaxis: Lovenox (prophylaxtic dose) and Mechanical Continous Sedation  Consults: Treatment Team:  Kathryne Hitch, MD    Events:  Subjective:    Overnight Issues: Moving arms and legs opening eyes to voice on vent  Objective:  Vital signs for last 24 hours: Temp:  [93.1 F (33.9 C)-99.8 F (37.7 C)] 99.8 F (37.7 C) (07/17 0757) Pulse Rate:  [106-150] 132 (07/17 0800) Resp:  [12-26] 18 (07/17 0800) BP: (70-229)/(53-129) 158/71 mmHg (07/17 0816) SpO2:  [94 %-100 %] 97 % (07/17 0800) Arterial Line BP: (105-189)/(57-122) 163/76 mmHg (07/17 0800) FiO2 (%):  [40 %-100 %] 40 % (07/17 0900) Weight:  [77.111 kg (170 lb)-89.4 kg (197 lb 1.5 oz)] 89.4 kg (197 lb 1.5 oz) (07/16 2000)  Hemodynamic parameters for last 24 hours:    Intake/Output from previous  day: 07/16 0701 - 07/17 0700 In: 7201.5 [I.V.:5046.5; Blood:2155] Out: 3050 [Urine:2950; Blood:100]  Intake/Output this shift: Total I/O In: 269.4 [I.V.:269.4] Out: 130 [Urine:130]  Vent settings for last 24 hours: Vent Mode:  [-] PRVC FiO2 (%):  [40 %-100 %] 40 % Set Rate:  [12 bmp-20 bmp] 12 bmp Vt Set:  [500 mL-550 mL] 550 mL PEEP:  [5 cmH20] 5 cmH20 Plateau Pressure:  [19 cmH20-22 cmH20] 20 cmH20  Physical Exam:  On vent opens eyes and follows commands  Pulm  Lungs CTA  CV RRR Ext:  Ex  fixes in place   Results for orders placed or performed during the hospital encounter of 08/24/14 (from the past 24 hour(s))  Prepare fresh frozen plasma     Status: None (Preliminary result)   Collection Time: 08/24/14  2:05 PM  Result Value Ref Range   Unit Number [id number]    Blood Component Type THAWED PLASMA    Unit division 00    Status of Unit ISSUED,FINAL    Unit tag comment VERBAL ORDERS PER DR STEINL    Transfusion Status OK TO TRANSFUSE    Unit Number [id number]    Blood Component Type THW PLS APHR    Unit division 00    Status of Unit ISSUED,FINAL    Unit tag comment VERBAL ORDERS PER DR STEINL    Transfusion Status OK TO TRANSFUSE    Unit Number [id number]    Blood Component Type LIQ PLASMA    Unit division 00    Status of Unit REL FROM [facility]    Unit tag comment VERBAL ORDERS PER DR STEINL    Transfusion Status OK TO TRANSFUSE    Unit Number [id number]    Blood Component Type LIQ PLASMA    Unit division 00    Status of Unit REL FROM [facility]    Unit tag comment VERBAL ORDERS PER DR STEINL    Transfusion Status OK TO TRANSFUSE    Unit Number [id number]    Blood Component Type THAWED PLASMA    Unit division 00    Status of Unit REL FROM [facility]    Unit tag comment VERBAL ORDERS PER DR THOMPSON    Transfusion Status OK TO TRANSFUSE    Unit Number [id number]    Blood Component Type THWPLS APHR2    Unit division 00    Status of Unit ALLOCATED    Transfusion Status OK TO TRANSFUSE    Unit Number [id number]    Blood Component Type THWPLS APHR2    Unit division 00    Status of Unit ALLOCATED    Transfusion Status OK TO TRANSFUSE    Unit Number [id number]    Blood Component Type THAWED PLASMA    Unit division 00    Status of Unit ALLOCATED    Transfusion Status OK TO TRANSFUSE    Unit Number [id number]    Blood Component Type THAWED PLASMA    Unit division 00    Status of Unit ALLOCATED    Transfusion Status OK TO TRANSFUSE   Type and  screen     Status: None (Preliminary result)   Collection Time: 08/24/14  2:10 PM  Result Value Ref Range   ABO/RH(D) A POS    Antibody Screen NEG    Sample Expiration 08/27/2014    Unit Number [id number]    Blood Component Type RED CELLS,LR    Unit division 00    Status of Unit ISSUED,FINAL    Unit tag comment  VERBAL ORDERS PER DR STEINL    Transfusion Status OK TO TRANSFUSE    Crossmatch Result COMPATIBLE    Unit Number [id number]    Blood Component Type RED CELLS,LR    Unit division 00    Status of Unit ISSUED,FINAL    Unit tag comment VERBAL ORDERS PER DR STEINL    Transfusion Status OK TO TRANSFUSE    Crossmatch Result COMPATIBLE    Unit Number [id number]    Blood Component Type RED CELLS,LR    Unit division 00    Status of Unit ISSUED,FINAL    Unit tag comment VERBAL ORDERS PER DR STEINL    Transfusion Status OK TO TRANSFUSE    Crossmatch Result Compatible    Unit Number [id number]    Blood Component Type RED CELLS,LR    Unit division 00    Status of Unit ISSUED,FINAL    Unit tag comment VERBAL ORDERS PER DR STEINL    Transfusion Status OK TO TRANSFUSE    Crossmatch Result Compatible    Unit Number [id number]    Blood Component Type RED CELLS,LR    Unit division 00    Status of Unit ALLOCATED    Transfusion Status OK TO TRANSFUSE    Crossmatch Result Compatible    Unit Number [id number]    Blood Component Type RED CELLS,LR    Unit division 00    Status of Unit ALLOCATED    Transfusion Status OK TO TRANSFUSE    Crossmatch Result Compatible    Unit Number [id number]    Blood Component Type RED CELLS,LR    Unit division 00    Status of Unit ISSUED,FINAL    Transfusion Status OK TO TRANSFUSE    Crossmatch Result Compatible    Unit Number [id number]    Blood Component Type RED CELLS,LR    Unit division 00    Status of Unit ALLOCATED    Transfusion Status OK TO TRANSFUSE    Crossmatch Result Compatible    Unit Number [id number]     Blood Component Type RED CELLS,LR    Unit division 00    Status of Unit ALLOCATED    Transfusion Status OK TO TRANSFUSE    Crossmatch Result Compatible    Unit Number [id number]    Blood Component Type RED CELLS,LR    Unit division 00    Status of Unit ALLOCATED    Transfusion Status OK TO TRANSFUSE    Crossmatch Result Compatible   Comprehensive metabolic panel     Status: Abnormal   Collection Time: 08/24/14  2:10 PM  Result Value Ref Range   Sodium 139 135 - 145 mmol/L   Potassium 4.6 3.5 - 5.1 mmol/L   Chloride 110 101 - 111 mmol/L   CO2 15 (L) 22 - 32 mmol/L   Glucose, Bld 203 (H) 65 - 99 mg/dL   BUN 11 6 - 20 mg/dL   Creatinine, Ser 7.62 (H) 0.44 - 1.00 mg/dL   Calcium 8.6 (L) 8.9 - 10.3 mg/dL   Total Protein 5.9 (L) 6.5 - 8.1 g/dL   Albumin 3.2 (L) 3.5 - 5.0 g/dL   AST 831 (H) 15 - 41 U/L   ALT 223 (H) 14 - 54 U/L   Alkaline Phosphatase 69 38 - 126 U/L   Total Bilirubin 0.9 0.3 - 1.2 mg/dL   GFR calc non Af Amer 53 (L) >60 mL/min   GFR calc Af Amer >60 >60 mL/min   Anion gap 14 5 - 15  CBC     Status: Abnormal   Collection Time: 08/24/14  2:10 PM  Result Value Ref Range   WBC 15.7 (H) 4.0 - 10.5 K/uL   RBC 4.49 3.87 - 5.11 MIL/uL   Hemoglobin 12.4 12.0 - 15.0 g/dL   HCT 16.139.2 09.636.0 - 04.546.0 %   MCV 87.3 78.0 - 100.0 fL   MCH 27.6 26.0 - 34.0 pg   MCHC 31.6 30.0 - 36.0 g/dL   RDW 40.914.6 81.111.5 - 91.415.5 %   Platelets 231 150 - 400 K/uL  Protime-INR     Status: Abnormal   Collection Time: 08/24/14  2:10 PM  Result Value Ref Range   Prothrombin Time 16.1 (H) 11.6 - 15.2 seconds   INR 1.28 0.00 - 1.49  ABO/Rh     Status: None   Collection Time: 08/24/14  2:10 PM  Result Value Ref Range   ABO/RH(D) A POS   I-stat chem 8, ed     Status: Abnormal   Collection Time: 08/24/14  2:27 PM  Result Value Ref Range   Sodium 140 135 - 145 mmol/L   Potassium 4.5 3.5 - 5.1 mmol/L   Chloride 109 101 - 111 mmol/L   BUN 15 6 - 20 mg/dL   Creatinine, Ser 7.821.20 (H) 0.44 - 1.00 mg/dL    Glucose, Bld 956197 (H) 65 - 99 mg/dL   Calcium, Ion 2.131.12 0.861.12 - 1.23 mmol/L   TCO2 17 0 - 100 mmol/L   Hemoglobin 14.3 12.0 - 15.0 g/dL   HCT 57.842.0 46.936.0 - 62.946.0 %  I-Stat CG4 Lactic Acid, ED     Status: Abnormal   Collection Time: 08/24/14  2:27 PM  Result Value Ref Range   Lactic Acid, Venous 6.48 (HH) 0.5 - 2.0 mmol/L   Comment NOTIFIED PHYSICIAN   I-Stat arterial blood gas, ED     Status: Abnormal   Collection Time: 08/24/14  3:28 PM  Result Value Ref Range   pH, Arterial 7.192 (LL) 7.350 - 7.450   pCO2 arterial 59.1 (HH) 35.0 - 45.0 mmHg   pO2, Arterial 227.0 (H) 80.0 - 100.0 mmHg   Bicarbonate 22.7 20.0 - 24.0 mEq/L   TCO2 24 0 - 100 mmol/L   O2 Saturation 100.0 %   Acid-base deficit 6.0 (H) 0.0 - 2.0 mmol/L   Patient temperature 98.7 F    Collection site RADIAL, ALLEN'S TEST ACCEPTABLE    Sample type ARTERIAL    Comment NOTIFIED PHYSICIAN   I-STAT 7, (LYTES, BLD GAS, ICA, H+H)     Status: Abnormal   Collection Time: 08/24/14  5:28 PM  Result Value Ref Range   pH, Arterial 7.179 (LL) 7.350 - 7.450   pCO2 arterial 50.0 (H) 35.0 - 45.0 mmHg   pO2, Arterial 433.0 (H) 80.0 - 100.0 mmHg   Bicarbonate 19.0 (L) 20.0 - 24.0 mEq/L   TCO2 21 0 - 100 mmol/L   O2 Saturation 100.0 %   Acid-base deficit 9.0 (H) 0.0 - 2.0 mmol/L   Sodium 143 135 - 145 mmol/L   Potassium 3.1 (L) 3.5 - 5.1 mmol/L   Calcium, Ion 1.07 (L) 1.12 - 1.23 mmol/L   HCT 22.0 (L) 36.0 - 46.0 %   Hemoglobin 7.5 (L) 12.0 - 15.0 g/dL   Patient temperature 52.835.5 C    Sample type ARTERIAL    Comment VALUES EXPECTED, NO REPEAT   MRSA PCR Screening     Status: None   Collection Time: 08/24/14  7:15 PM  Result Value  Ref Range   MRSA by PCR NEGATIVE NEGATIVE  CDS serology     Status: None   Collection Time: 08/24/14  8:05 PM  Result Value Ref Range   CDS serology specimen      SPECIMEN WILL BE HELD FOR 14 DAYS IF TESTING IS REQUIRED  Ethanol     Status: None   Collection Time: 08/24/14  8:05 PM  Result Value Ref  Range   Alcohol, Ethyl (B) <5 <5 mg/dL  CBC     Status: Abnormal   Collection Time: 08/24/14  8:05 PM  Result Value Ref Range   WBC 9.5 4.0 - 10.5 K/uL   RBC 4.64 3.87 - 5.11 MIL/uL   Hemoglobin 13.8 12.0 - 15.0 g/dL   HCT 16.1 09.6 - 04.5 %   MCV 87.1 78.0 - 100.0 fL   MCH 29.7 26.0 - 34.0 pg   MCHC 34.2 30.0 - 36.0 g/dL   RDW 40.9 81.1 - 91.4 %   Platelets 97 (L) 150 - 400 K/uL  Triglycerides     Status: None   Collection Time: 08/24/14  8:05 PM  Result Value Ref Range   Triglycerides 109 <150 mg/dL  Blood gas, arterial     Status: Abnormal   Collection Time: 08/24/14 10:50 PM  Result Value Ref Range   FIO2 1.00 %   Delivery systems VENTILATOR    Mode PRESSURE REGULATED VOLUME CONTROL    VT 500 mL   Rate 12 resp/min   Peep/cpap 5.0 cm H20   pH, Arterial 7.286 (L) 7.350 - 7.450   pCO2 arterial 44.6 35.0 - 45.0 mmHg   pO2, Arterial 244 (H) 80.0 - 100.0 mmHg   Bicarbonate 20.7 20.0 - 24.0 mEq/L   TCO2 22.2 0 - 100 mmol/L   Acid-base deficit 4.9 (H) 0.0 - 2.0 mmol/L   O2 Saturation 99.1 %   Patient temperature 97.5    Collection site A-LINE    Drawn by [phone]    Sample type ARTERIAL DRAW   Blood gas, arterial     Status: Abnormal   Collection Time: 08/25/14  3:22 AM  Result Value Ref Range   FIO2 0.60 %   Delivery systems VENTILATOR    Mode PRESSURE REGULATED VOLUME CONTROL    VT 550 mL   Rate 12 resp/min   Peep/cpap 5.0 cm H20   pH, Arterial 7.390 7.350 - 7.450   pCO2 arterial 37.6 35.0 - 45.0 mmHg   pO2, Arterial 236 (H) 80.0 - 100.0 mmHg   Bicarbonate 22.2 20.0 - 24.0 mEq/L   TCO2 23.4 0 - 100 mmol/L   Acid-base deficit 2.0 0.0 - 2.0 mmol/L   O2 Saturation 99.3 %   Patient temperature 98.6    Collection site A-LINE    Drawn by [id number]    Sample type ARTERIAL DRAW   CBC     Status: Abnormal   Collection Time: 08/25/14  5:45 AM  Result Value Ref Range   WBC 8.2 4.0 - 10.5 K/uL   RBC 3.69 (L) 3.87 - 5.11 MIL/uL   Hemoglobin 11.0 (L) 12.0 - 15.0 g/dL   HCT  65.7 (L) 84.6 - 46.0 %   MCV 84.8 78.0 - 100.0 fL   MCH 29.8 26.0 - 34.0 pg   MCHC 35.1 30.0 - 36.0 g/dL   RDW 96.2 95.2 - 84.1 %   Platelets 111 (L) 150 - 400 K/uL  Basic metabolic panel     Status: Abnormal   Collection Time: 08/25/14  5:45 AM  Result Value Ref Range   Sodium 143 135 - 145 mmol/L   Potassium 3.6 3.5 - 5.1 mmol/L   Chloride 113 (H) 101 - 111 mmol/L   CO2 23 22 - 32 mmol/L   Glucose, Bld 108 (H) 65 - 99 mg/dL   BUN 10 6 - 20 mg/dL   Creatinine, Ser 1.61 0.44 - 1.00 mg/dL   Calcium 6.8 (L) 8.9 - 10.3 mg/dL   GFR calc non Af Amer >60 >60 mL/min   GFR calc Af Amer >60 >60 mL/min   Anion gap 7 5 - 15     Assessment/Plan:    LOS: 1 day   Patient Active Problem List   Diagnosis Date Noted  . Open fracture of bone of knee joint 08/24/2014  Anoxic brain injury moving purposefully and opening eyes now  VRDF  continue ventilatory support DVT prophylaxsis    Critical Care Total Time*: 30 Minutes  Shiree Altemus A. 08/25/2014  *Care during the described time interval was provided by me and/or other providers on the critical care team.  I have reviewed this patient's available data, including medical history, events of note, physical examination and test results as part of my evaluation.

## 2014-08-26 ENCOUNTER — Inpatient Hospital Stay (HOSPITAL_COMMUNITY): Payer: Medicaid Other

## 2014-08-26 ENCOUNTER — Encounter (HOSPITAL_COMMUNITY): Payer: Self-pay | Admitting: Orthopedic Surgery

## 2014-08-26 LAB — CBC
HCT: 23.2 % — ABNORMAL LOW (ref 36.0–46.0)
HCT: 28.9 % — ABNORMAL LOW (ref 36.0–46.0)
Hemoglobin: 8.1 g/dL — ABNORMAL LOW (ref 12.0–15.0)
Hemoglobin: 9.9 g/dL — ABNORMAL LOW (ref 12.0–15.0)
MCH: 29.7 pg (ref 26.0–34.0)
MCH: 29.7 pg (ref 26.0–34.0)
MCHC: 34.9 g/dL (ref 30.0–36.0)
MCHC: 34.3 g/dL (ref 30.0–36.0)
MCV: 85 fL (ref 78.0–100.0)
MCV: 86.8 fL (ref 78.0–100.0)
Platelets: 95 10*3/uL — ABNORMAL LOW (ref 150–400)
Platelets: 97 10*3/uL — ABNORMAL LOW (ref 150–400)
RBC: 2.73 MIL/uL — ABNORMAL LOW (ref 3.87–5.11)
RBC: 3.33 MIL/uL — ABNORMAL LOW (ref 3.87–5.11)
RDW: 15.7 % — ABNORMAL HIGH (ref 11.5–15.5)
RDW: 15.7 % — ABNORMAL HIGH (ref 11.5–15.5)
WBC: 11.4 10*3/uL — ABNORMAL HIGH (ref 4.0–10.5)
WBC: 13.6 10*3/uL — ABNORMAL HIGH (ref 4.0–10.5)

## 2014-08-26 LAB — PREPARE FRESH FROZEN PLASMA
Unit division: 0
Unit division: 0
Unit division: 0
Unit division: 0
Unit division: 0
Unit division: 0
Unit division: 0
Unit division: 0
Unit division: 0

## 2014-08-26 LAB — COMPREHENSIVE METABOLIC PANEL
ALT: 71 U/L — ABNORMAL HIGH (ref 14–54)
AST: 123 U/L — ABNORMAL HIGH (ref 15–41)
Albumin: 2.4 g/dL — ABNORMAL LOW (ref 3.5–5.0)
Alkaline Phosphatase: 31 U/L — ABNORMAL LOW (ref 38–126)
Anion gap: 5 (ref 5–15)
BUN: 8 mg/dL (ref 6–20)
CO2: 23 mmol/L (ref 22–32)
Calcium: 6.9 mg/dL — ABNORMAL LOW (ref 8.9–10.3)
Chloride: 115 mmol/L — ABNORMAL HIGH (ref 101–111)
Creatinine, Ser: 0.83 mg/dL (ref 0.44–1.00)
GFR calc Af Amer: >60 mL/min (ref >60)
GFR calc non Af Amer: >60 mL/min (ref >60)
Glucose, Bld: 94 mg/dL (ref 65–99)
Potassium: 3.5 mmol/L (ref 3.5–5.1)
Sodium: 143 mmol/L (ref 135–145)
Total Bilirubin: 0.6 mg/dL (ref 0.3–1.2)
Total Protein: 4.3 g/dL — ABNORMAL LOW (ref 6.5–8.1)

## 2014-08-26 LAB — LACTIC ACID, PLASMA: Lactic Acid, Venous: 0.7 mmol/L (ref 0.5–2.0)

## 2014-08-26 MED ORDER — SODIUM CHLORIDE 0.9 % IV SOLN: Freq: Once | INTRAVENOUS | Status: DC

## 2014-08-26 MED ORDER — IOHEXOL 300 MG/ML  SOLN: 100.0000 mL | Freq: Once | INTRAMUSCULAR | Status: AC | PRN

## 2014-08-26 MED ORDER — CEFAZOLIN SODIUM-DEXTROSE 2-3 GM-% IV SOLR
2.0000 g | Freq: Three times a day (TID) | INTRAVENOUS | Status: DC
  Administered 2014-08-26 – 2014-08-29 (×11): 2 g via INTRAVENOUS
  Filled 2014-08-26 (×12): qty 50

## 2014-08-26 MED ORDER — ACETAMINOPHEN 160 MG/5ML PO SOLN
650.0000 mg | ORAL | Status: DC | PRN
  Administered 2014-08-26 – 2014-09-03 (×8): 650 mg
  Filled 2014-08-26 (×8): qty 20.3
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
## 2014-08-26 NOTE — Progress Notes (Signed)
Follow up - Trauma and Critical Care  Patient Details:    [name] is an 49 y.o. female.  Lines/tubes : Airway (Active)  Secured at (cm) 23 cm 08/26/2014  7:56 AM  Measured From Lips 08/26/2014  7:56 AM  Secured Location Center 08/26/2014  7:56 AM  Secured By [employer] 08/26/2014  7:56 AM  Tube Holder Repositioned Yes 08/26/2014  7:56 AM  Cuff Pressure (cm H2O) 22 cm H2O 08/25/2014  8:17 AM  Site Condition Dry 08/26/2014  7:56 AM     Arterial Line 08/24/14 Left Brachial (Active)  Site Assessment Clean;Dry;Intact 08/26/2014  7:50 AM  Line Status Pulsatile blood flow 08/26/2014  7:50 AM  Art Line Waveform Appropriate 08/26/2014  7:50 AM  Art Line Interventions Zeroed and calibrated;Leveled;Connections checked and tightened 08/26/2014  7:50 AM  Color/Movement/Sensation Capillary refill less than 3 sec 08/26/2014  7:50 AM  Dressing Type Transparent 08/26/2014  7:50 AM  Dressing Status Clean;Dry;Intact 08/26/2014  7:50 AM     NG/OG Tube Orogastric 16 Fr. Right mouth (Active)  Placement Verification Auscultation 08/26/2014  8:00 AM  Site Assessment Clean;Dry;Intact 08/26/2014  8:00 AM  Status Suction-low intermittent 08/26/2014  8:00 AM  Drainage Appearance Brown 08/26/2014  8:00 AM  Output (mL) 100 mL 08/25/2014  6:00 PM     Urethral Catheter A. Collins RN Latex;Straight-tip 16 Fr. (Active)  Indication for Insertion or Continuance of Catheter Unstable critical patients (first 24-48 hours) 08/26/2014  8:00 AM  Site Assessment Clean;Intact 08/26/2014  8:00 AM  Catheter Maintenance Bag below level of bladder 08/26/2014  8:00 AM  Collection Container Standard drainage bag 08/26/2014  8:00 AM  Securement Method Securing device (Describe) 08/26/2014  8:00 AM  Urinary Catheter Interventions Unclamped 08/26/2014  8:00 AM  Output (mL) 110 mL 08/26/2014  8:00 AM    Microbiology/Sepsis markers: Results for orders placed or performed during the hospital encounter of 08/24/14  MRSA PCR Screening      Status: None   Collection Time: 08/24/14  7:15 PM  Result Value Ref Range Status   MRSA by PCR NEGATIVE NEGATIVE Final    Comment:        The GeneXpert MRSA Assay (FDA approved for NASAL specimens only), is one component of a comprehensive MRSA colonization surveillance program. It is not intended to diagnose MRSA infection nor to guide or monitor treatment for MRSA infections.     Anti-infectives:  Anti-infectives    Start     Dose/Rate Route Frequency Ordered Stop   08/24/14 1615  ceFAZolin (ANCEF) IVPB 2 g/50 mL premix     2 g 100 mL/hr over 30 Minutes Intravenous  Once 08/24/14 1602        Best Practice/Protocols:  VTE Prophylaxis: Mechanical GI Prophylaxis: Proton Pump Inhibitor Continous Sedation  Consults: Treatment Team:  Kathryne Hitch, MD    Events:  Subjective:    Overnight Issues: Patient will awaken, but will not follow commands for the nurses.  I did not attempt to get the patient o awaken.  Objective:  Vital signs for last 24 hours: Temp:  [98.6 F (37 C)-101 F (38.3 C)] 101 F (38.3 C) (07/18 0738) Pulse Rate:  [115-132] 124 (07/18 0800) Resp:  [12-20] 14 (07/18 0800) BP: (88-168)/(45-89) 139/64 mmHg (07/18 0756) SpO2:  [98 %-100 %] 100 % (07/18 0800) Arterial Line BP: (92-181)/(56-122) 150/67 mmHg (07/18 0800) FiO2 (%):  [30 %-40 %] 30 % (07/18 0800)  Hemodynamic parameters for last 24 hours:    Intake/Output  from previous day: 07/17 0701 - 07/18 0700 In: 2880.4 [I.V.:2880.4] Out: 1080 [Urine:980; Emesis/NG output:100]  Intake/Output this shift: Total I/O In: 253 [I.V.:253] Out: 110 [Urine:110]  Vent settings for last 24 hours: Vent Mode:  [-] PRVC FiO2 (%):  [30 %-40 %] 30 % Set Rate:  [12 bmp] 12 bmp Vt Set:  [550 mL] 550 mL PEEP:  [5 cmH20] 5 cmH20 Plateau Pressure:  [17 cmH20-22 cmH20] 17 cmH20  Physical Exam:  General: no respiratory distress and intermittently agitated, but not fighting the  ventilator. Neuro: RASS 0 Resp: clear to auscultation bilaterally CVS: regular rate and rhythm, S1, S2 normal, no murmur, click, rub or gallop and tachycardic in the 120's GI: soft, nontender, BS WNL, no r/g and could probably start tube feedings. Extremities: Has a spanning external fixator on the right.  External fixator on the left thigh.  No results found for this or any previous visit (from the past 24 hour(s)).   Assessment/Plan:   NEURO  Altered Mental Status:  agitation, change in mental status and sedation   Plan: On sedation, but will get agitated easily when awakened.  PULM  No apparent problems   Plan: CPM  CARDIO  Sinus Tachycardia   Plan: No specific management.  RENAL  Urine output and renal function are okay.   Plan: CPM  GI  No specific abdominal trauma issues.   Plan: CPM  ID  No known infectious problems   Plan: CPM  HEME  Anemia acute blood loss anemia)   Plan: Not critical and does not require transfusion  ENDO No specific issues   Plan: CPM  Global Issues  Patient will need followup surgery on the extremities within the next 24-48 hours.  Scans of these extremities will be done today.  Will not attempt to wean to extubate today, and not before surgery later this week.   LOS: 2 days   Additional comments:I reviewed the patient's other test results. from yesterday and including CXR.  Critical Care Total Time*: 30 Minutes  Dyshon Philbin 08/26/2014  *Care during the described time interval was provided by me and/or other providers on the critical care team.  I have reviewed this patient's available data, including medical history, events of note, physical examination and test results as part of my evaluation.

## 2014-08-27 ENCOUNTER — Inpatient Hospital Stay (HOSPITAL_COMMUNITY): Payer: Medicaid Other

## 2014-08-27 ENCOUNTER — Inpatient Hospital Stay (HOSPITAL_COMMUNITY): Payer: Medicaid Other | Admitting: Anesthesiology

## 2014-08-27 ENCOUNTER — Encounter (HOSPITAL_COMMUNITY): Payer: Self-pay | Admitting: Anesthesiology

## 2014-08-27 ENCOUNTER — Encounter (HOSPITAL_COMMUNITY): Admission: EM | Disposition: A | Discharge status: Skilled Nursing Facility | Payer: Self-pay | Source: Home / Self Care

## 2014-08-27 HISTORY — PX: FEMUR IM NAIL: SHX1597

## 2014-08-27 HISTORY — PX: ORIF TIBIA PLATEAU: SHX2132

## 2014-08-27 HISTORY — PX: EXTERNAL FIXATION LEG: SHX1549

## 2014-08-27 LAB — COMPREHENSIVE METABOLIC PANEL
ALT: 59 U/L — ABNORMAL HIGH (ref 14–54)
AST: 112 U/L — ABNORMAL HIGH (ref 15–41)
Albumin: 2.3 g/dL — ABNORMAL LOW (ref 3.5–5.0)
Alkaline Phosphatase: 37 U/L — ABNORMAL LOW (ref 38–126)
Anion gap: 6 (ref 5–15)
BUN: 6 mg/dL (ref 6–20)
CO2: 22 mmol/L (ref 22–32)
Calcium: 7.4 mg/dL — ABNORMAL LOW (ref 8.9–10.3)
Chloride: 113 mmol/L — ABNORMAL HIGH (ref 101–111)
Creatinine, Ser: 0.73 mg/dL (ref 0.44–1.00)
GFR calc Af Amer: >60 mL/min (ref >60)
GFR calc non Af Amer: >60 mL/min (ref >60)
Glucose, Bld: 85 mg/dL (ref 65–99)
Potassium: 3.4 mmol/L — ABNORMAL LOW (ref 3.5–5.1)
Sodium: 141 mmol/L (ref 135–145)
Total Bilirubin: 1.1 mg/dL (ref 0.3–1.2)
Total Protein: 4.6 g/dL — ABNORMAL LOW (ref 6.5–8.1)

## 2014-08-27 LAB — BLOOD PRODUCT ORDER (VERBAL) VERIFICATION

## 2014-08-27 LAB — PROTIME-INR
INR: 1.36 (ref 0.00–1.49)
Prothrombin Time: 16.9 seconds — ABNORMAL HIGH (ref 11.6–15.2)

## 2014-08-27 LAB — CBC
HCT: 28.8 % — ABNORMAL LOW (ref 36.0–46.0)
Hemoglobin: 9.7 g/dL — ABNORMAL LOW (ref 12.0–15.0)
MCH: 29.3 pg (ref 26.0–34.0)
MCHC: 33.7 g/dL (ref 30.0–36.0)
MCV: 87 fL (ref 78.0–100.0)
Platelets: 100 10*3/uL — ABNORMAL LOW (ref 150–400)
RBC: 3.31 MIL/uL — ABNORMAL LOW (ref 3.87–5.11)
RDW: 16.2 % — ABNORMAL HIGH (ref 11.5–15.5)
WBC: 13.7 10*3/uL — ABNORMAL HIGH (ref 4.0–10.5)

## 2014-08-27 LAB — LACTIC ACID, PLASMA: Lactic Acid, Venous: 0.7 mmol/L (ref 0.5–2.0)

## 2014-08-27 LAB — APTT: aPTT: 29 seconds (ref 24–37)

## 2014-08-27 SURGERY — INSERTION, INTRAMEDULLARY ROD, FEMUR
Anesthesia: General | Site: Leg Upper | Laterality: Right

## 2014-08-27 MED ORDER — MIDAZOLAM HCL 2 MG/2ML IJ SOLN
INTRAMUSCULAR | Status: AC
  Filled 2014-08-27: qty 2

## 2014-08-27 MED ORDER — LABETALOL HCL 5 MG/ML IV SOLN
INTRAVENOUS | Status: AC
  Filled 2014-08-27: qty 4

## 2014-08-27 MED ORDER — PROPOFOL 10 MG/ML IV BOLUS
INTRAVENOUS | Status: DC | PRN
Start: 2014-08-27 — End: 2014-08-27
  Administered 2014-08-27: 50 mg via INTRAVENOUS

## 2014-08-27 MED ORDER — PROPOFOL INFUSION 10 MG/ML OPTIME
INTRAVENOUS | Status: DC | PRN
  Administered 2014-08-27: 50 ug/kg/min via INTRAVENOUS

## 2014-08-27 MED ORDER — FENTANYL CITRATE (PF) 250 MCG/5ML IJ SOLN
INTRAMUSCULAR | Status: AC
  Filled 2014-08-27: qty 5

## 2014-08-27 MED ORDER — METOPROLOL TARTRATE 1 MG/ML IV SOLN
INTRAVENOUS | Status: DC | PRN
  Administered 2014-08-27 (×2): 2.5 mg via INTRAVENOUS

## 2014-08-27 MED ORDER — ROCURONIUM BROMIDE 50 MG/5ML IV SOLN
INTRAVENOUS | Status: AC
  Filled 2014-08-27: qty 4

## 2014-08-27 MED ORDER — FENTANYL CITRATE (PF) 100 MCG/2ML IJ SOLN: 25.0000 ug | INTRAMUSCULAR | Status: DC | PRN

## 2014-08-27 MED ORDER — CEFAZOLIN SODIUM-DEXTROSE 2-3 GM-% IV SOLR
INTRAVENOUS | Status: AC
  Filled 2014-08-27: qty 50

## 2014-08-27 MED ORDER — FENTANYL CITRATE (PF) 100 MCG/2ML IJ SOLN
INTRAMUSCULAR | Status: DC | PRN
  Administered 2014-08-27 (×3): 100 ug via INTRAVENOUS
  Administered 2014-08-27: 50 ug via INTRAVENOUS
  Administered 2014-08-27: 100 ug via INTRAVENOUS
  Administered 2014-08-27: 50 ug via INTRAVENOUS

## 2014-08-27 MED ORDER — PROPOFOL 10 MG/ML IV BOLUS
INTRAVENOUS | Status: AC
  Filled 2014-08-27: qty 20

## 2014-08-27 MED ORDER — SODIUM CHLORIDE 0.9 % IR SOLN
Status: DC | PRN
Start: 2014-08-27 — End: 2014-08-27
  Administered 2014-08-27: 1000 mL

## 2014-08-27 MED ORDER — SODIUM CHLORIDE 0.9 % IR SOLN
Status: DC | PRN
  Administered 2014-08-27: 1000 mL
  Administered 2014-08-27 (×3): 3000 mL

## 2014-08-27 MED ORDER — ROCURONIUM BROMIDE 50 MG/5ML IV SOLN
INTRAVENOUS | Status: AC
  Filled 2014-08-27: qty 1

## 2014-08-27 MED ORDER — ROCURONIUM BROMIDE 100 MG/10ML IV SOLN
INTRAVENOUS | Status: DC | PRN
  Administered 2014-08-27: 30 mg via INTRAVENOUS
  Administered 2014-08-27: 10 mg via INTRAVENOUS
  Administered 2014-08-27 (×2): 20 mg via INTRAVENOUS
  Administered 2014-08-27: 50 mg via INTRAVENOUS
  Administered 2014-08-27: 20 mg via INTRAVENOUS

## 2014-08-27 MED ORDER — LACTATED RINGERS IV SOLN
INTRAVENOUS | Status: DC | PRN
  Administered 2014-08-27 (×2): via INTRAVENOUS

## 2014-08-27 MED ORDER — ONDANSETRON HCL 4 MG/2ML IJ SOLN: 4.0000 mg | Freq: Once | INTRAMUSCULAR | Status: AC | PRN

## 2014-08-27 SURGICAL SUPPLY — 117 items
BANDAGE ELASTIC 4 VELCRO ST LF (GAUZE/BANDAGES/DRESSINGS) ×11 IMPLANT
BANDAGE ELASTIC 6 VELCRO ST LF (GAUZE/BANDAGES/DRESSINGS) ×3 IMPLANT
BANDAGE ESMARK 6X9 LF (GAUZE/BANDAGES/DRESSINGS) ×3 IMPLANT
BIT DRILL 4.8X200 CANN (BIT) ×2 IMPLANT
BLADE SURG 10 STRL SS (BLADE) ×5 IMPLANT
BLADE SURG 15 STRL LF DISP TIS (BLADE) ×3 IMPLANT
BLADE SURG 15 STRL SS (BLADE)
BLADE SURG ROTATE 9660 (MISCELLANEOUS) IMPLANT
BNDG CMPR 9X6 STRL LF SNTH (GAUZE/BANDAGES/DRESSINGS)
BNDG CMPR MED 15X6 ELC VLCR LF (GAUZE/BANDAGES/DRESSINGS) ×3
BNDG COHESIVE 4X5 TAN STRL (GAUZE/BANDAGES/DRESSINGS) ×5 IMPLANT
BNDG COHESIVE 6X5 TAN STRL LF (GAUZE/BANDAGES/DRESSINGS) ×5 IMPLANT
BNDG ELASTIC 6X15 VLCR STRL LF (GAUZE/BANDAGES/DRESSINGS) ×2 IMPLANT
BNDG ESMARK 6X9 LF (GAUZE/BANDAGES/DRESSINGS)
BNDG GAUZE ELAST 4 BULKY (GAUZE/BANDAGES/DRESSINGS) ×14 IMPLANT
BRUSH SCRUB DISP (MISCELLANEOUS) ×18 IMPLANT
CANISTER SUCT 3000ML PPV (MISCELLANEOUS) ×5 IMPLANT
CANISTER WOUND CARE 500ML ATS (WOUND CARE) ×2 IMPLANT
CLEANER TIP ELECTROSURG 2X2 (MISCELLANEOUS) ×3 IMPLANT
CLOSURE WOUND 1/2 X4 (GAUZE/BANDAGES/DRESSINGS)
COVER BACK TABLE 80X110 HD (DRAPES) ×2 IMPLANT
COVER MAYO STAND STRL (DRAPES) ×5 IMPLANT
COVER PERINEAL POST (MISCELLANEOUS) ×3 IMPLANT
COVER SURGICAL LIGHT HANDLE (MISCELLANEOUS) ×8 IMPLANT
CUFF TOURNIQUET SINGLE 18IN (TOURNIQUET CUFF) IMPLANT
CUFF TOURNIQUET SINGLE 24IN (TOURNIQUET CUFF) IMPLANT
CUFF TOURNIQUET SINGLE 34IN LL (TOURNIQUET CUFF) IMPLANT
DRAPE C-ARM 42X72 X-RAY (DRAPES) ×7 IMPLANT
DRAPE C-ARMOR (DRAPES) ×7 IMPLANT
DRAPE INCISE IOBAN 66X45 STRL (DRAPES) ×3 IMPLANT
DRAPE ORTHO SPLIT 77X108 STRL (DRAPES) ×20
DRAPE PROXIMA HALF (DRAPES) ×6 IMPLANT
DRAPE STERI IOBAN 125X83 (DRAPES) ×5 IMPLANT
DRAPE SURG ORHT 6 SPLT 77X108 (DRAPES) IMPLANT
DRAPE U-SHAPE 47X51 STRL (DRAPES) ×3 IMPLANT
DRSG ADAPTIC 3X8 NADH LF (GAUZE/BANDAGES/DRESSINGS) ×3 IMPLANT
DRSG EMULSION OIL 3X3 NADH (GAUZE/BANDAGES/DRESSINGS) ×3 IMPLANT
DRSG MEPILEX BORDER 4X4 (GAUZE/BANDAGES/DRESSINGS) ×3 IMPLANT
DRSG MEPILEX BORDER 4X8 (GAUZE/BANDAGES/DRESSINGS) ×3 IMPLANT
DRSG MEPITEL 4X7.2 (GAUZE/BANDAGES/DRESSINGS) ×2 IMPLANT
DRSG PAD ABDOMINAL 8X10 ST (GAUZE/BANDAGES/DRESSINGS) ×14 IMPLANT
DRSG VAC ATS SM SENSATRAC (GAUZE/BANDAGES/DRESSINGS) ×2 IMPLANT
ELECT REM PT RETURN 9FT ADLT (ELECTROSURGICAL) ×5
ELECTRODE REM PT RTRN 9FT ADLT (ELECTROSURGICAL) ×3 IMPLANT
EVACUATOR 1/8 PVC DRAIN (DRAIN) ×2 IMPLANT
EVACUATOR 3/16  PVC DRAIN (DRAIN)
EVACUATOR 3/16 PVC DRAIN (DRAIN) IMPLANT
GAUZE SPONGE 4X4 12PLY STRL (GAUZE/BANDAGES/DRESSINGS) ×11 IMPLANT
GLOVE BIO SURGEON STRL SZ7.5 (GLOVE) ×9 IMPLANT
GLOVE BIO SURGEON STRL SZ8 (GLOVE) ×9 IMPLANT
GLOVE BIOGEL PI IND STRL 6.5 (GLOVE) IMPLANT
GLOVE BIOGEL PI IND STRL 7.5 (GLOVE) ×3 IMPLANT
GLOVE BIOGEL PI IND STRL 8 (GLOVE) ×3 IMPLANT
GLOVE BIOGEL PI INDICATOR 6.5 (GLOVE) ×2
GLOVE BIOGEL PI INDICATOR 7.5 (GLOVE) ×4
GLOVE BIOGEL PI INDICATOR 8 (GLOVE) ×4
GOWN STRL REUS W/ TWL LRG LVL3 (GOWN DISPOSABLE) ×6 IMPLANT
GOWN STRL REUS W/ TWL XL LVL3 (GOWN DISPOSABLE) ×3 IMPLANT
GOWN STRL REUS W/TWL LRG LVL3 (GOWN DISPOSABLE) ×15
GOWN STRL REUS W/TWL XL LVL3 (GOWN DISPOSABLE) ×10
GUIDEWIRE BEAD TIP (WIRE) ×2 IMPLANT
HANDPIECE INTERPULSE COAX TIP (DISPOSABLE) ×5
IMMOBILIZER KNEE 22 UNIV (SOFTGOODS) ×5 IMPLANT
KIT BASIN OR (CUSTOM PROCEDURE TRAY) ×5 IMPLANT
KIT ROOM TURNOVER OR (KITS) ×5 IMPLANT
LINER BOOT UNIVERSAL DISP (MISCELLANEOUS) ×3 IMPLANT
MANIFOLD NEPTUNE II (INSTRUMENTS) ×5 IMPLANT
MANIFOLD NEPTUNE WASTE (CANNULA) ×2 IMPLANT
NAIL FEM RETRO 9X340 (Nail) ×2 IMPLANT
NDL SUT 6 .5 CRC .975X.05 MAYO (NEEDLE) IMPLANT
NEEDLE 22X1 1/2 (OR ONLY) (NEEDLE) IMPLANT
NEEDLE MAYO TAPER (NEEDLE)
NS IRRIG 1000ML POUR BTL (IV SOLUTION) ×5 IMPLANT
PACK GENERAL/GYN (CUSTOM PROCEDURE TRAY) ×3 IMPLANT
PACK ORTHO EXTREMITY (CUSTOM PROCEDURE TRAY) ×5 IMPLANT
PAD ARMBOARD 7.5X6 YLW CONV (MISCELLANEOUS) ×10 IMPLANT
PAD CAST 4YDX4 CTTN HI CHSV (CAST SUPPLIES) ×3 IMPLANT
PADDING CAST COTTON 4X4 STRL (CAST SUPPLIES) ×15
PADDING CAST COTTON 6X4 STRL (CAST SUPPLIES) ×9 IMPLANT
PENCIL BUTTON HOLSTER BLD 10FT (ELECTRODE) ×2 IMPLANT
PIN GUIDE 3.2 903003004 (MISCELLANEOUS) ×2 IMPLANT
PIN GUIDE DRILL TIP 2.8X300 (DRILL) ×6 IMPLANT
SCREW CANNULATED 6.5X60 KNEE (Screw) ×6 IMPLANT
SCREW CORT TI DBL LEAD 5X26 (Screw) ×2 IMPLANT
SCREW CORT TI DBL LEAD 5X30 (Screw) ×2 IMPLANT
SCREW CORT TI DBL LEAD 5X44 (Screw) ×2 IMPLANT
SCREW CORT TI DBL LEAD 5X65 (Screw) ×2 IMPLANT
SCREW CORT TI DBLE LEAD 5X52 (Screw) ×2 IMPLANT
SET HNDPC FAN SPRY TIP SCT (DISPOSABLE) IMPLANT
SPONGE LAP 18X18 X RAY DECT (DISPOSABLE) ×11 IMPLANT
SPONGE SCRUB IODOPHOR (GAUZE/BANDAGES/DRESSINGS) ×5 IMPLANT
STAPLER VISISTAT 35W (STAPLE) ×5 IMPLANT
STOCKINETTE IMPERVIOUS LG (DRAPES) ×5 IMPLANT
STRIP CLOSURE SKIN 1/2X4 (GAUZE/BANDAGES/DRESSINGS) IMPLANT
SUCTION FRAZIER TIP 10 FR DISP (SUCTIONS) ×5 IMPLANT
SUT ETHILON 2 0 FS 18 (SUTURE) ×4 IMPLANT
SUT ETHILON 3 0 PS 1 (SUTURE) ×9 IMPLANT
SUT ETHILON O TP 1 (SUTURE) IMPLANT
SUT PDS AB 0 CT 36 (SUTURE) ×2 IMPLANT
SUT PDS AB 2-0 CT1 27 (SUTURE) ×2 IMPLANT
SUT PROLENE 0 CT 2 (SUTURE) ×6 IMPLANT
SUT PROLENE 2 0 CT2 30 (SUTURE) ×2 IMPLANT
SUT VIC AB 0 CT1 27 (SUTURE) ×5
SUT VIC AB 0 CT1 27XBRD ANBCTR (SUTURE) ×6 IMPLANT
SUT VIC AB 1 CT1 27 (SUTURE) ×10
SUT VIC AB 1 CT1 27XBRD ANBCTR (SUTURE) ×3 IMPLANT
SUT VIC AB 2-0 CT1 27 (SUTURE) ×10
SUT VIC AB 2-0 CT1 TAPERPNT 27 (SUTURE) ×6 IMPLANT
SYR 20ML ECCENTRIC (SYRINGE) IMPLANT
SYR CONTROL 10ML LL (SYRINGE) ×2 IMPLANT
TOWEL OR 17X24 6PK STRL BLUE (TOWEL DISPOSABLE) ×10 IMPLANT
TOWEL OR 17X26 10 PK STRL BLUE (TOWEL DISPOSABLE) ×22 IMPLANT
TRAY FOLEY CATH 16FRSI W/METER (SET/KITS/TRAYS/PACK) IMPLANT
TUBE CONNECTING 12'X1/4 (SUCTIONS) ×2
TUBE CONNECTING 12X1/4 (SUCTIONS) ×5 IMPLANT
UNDERPAD 30X30 INCONTINENT (UNDERPADS AND DIAPERS) ×7 IMPLANT
YANKAUER SUCT BULB TIP NO VENT (SUCTIONS) ×7 IMPLANT
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
## 2014-08-27 NOTE — Transfer of Care (Signed)
Immediate Anesthesia Transfer of Care Note  Patient: Dineen KidMonica Savin  Procedure(s) Performed: Procedure(s) with comments: INTRAMEDULLARY (IM) NAIL FEMORAL (Left) OPEN REDUCTION INTERNAL FIXATION (ORIF) TIBIAL PLATEAU (Left) EXTERNAL FIXATION LEG/ WITH I&D (Right) - and upper leg  Patient Location: NICU  Anesthesia Type:General  Level of Consciousness: sedated, unresponsive and Patient remains intubated per anesthesia plan  Airway & Oxygen Therapy: Patient remains intubated per anesthesia plan and Patient placed on Ventilator (see vital sign flow sheet for setting)  Post-op Assessment: Report given to RN and Post -op Vital signs reviewed and stable  Post vital signs: Reviewed and stable  Last Vitals:  Filed Vitals:   08/27/14 1300  BP: 131/64  Pulse: 105  Temp:   Resp: 13    Complications: No apparent anesthesia complications

## 2014-08-27 NOTE — Anesthesia Preprocedure Evaluation (Addendum)
Anesthesia Evaluation  Patient identified by MRN, date of birth, ID band Patient awake    Reviewed: Allergy & Precautions, NPO status , Patient's Chart, lab work & pertinent test results  Airway Mallampati: Intubated       Dental  (+) Teeth Intact   Pulmonary Current Smoker,  breath sounds clear to auscultation        Cardiovascular hypertension, Pt. on medications Rhythm:Regular Rate:Tachycardia     Neuro/Psych    GI/Hepatic   Endo/Other    Renal/GU      Musculoskeletal   Abdominal   Peds  Hematology   Anesthesia Other Findings   Reproductive/Obstetrics                           Anesthesia Physical Anesthesia Plan  ASA: III  Anesthesia Plan: General   Post-op Pain Management:    Induction: Intravenous  Airway Management Planned: Oral ETT  Additional Equipment:   Intra-op Plan:   Post-operative Plan: Post-operative intubation/ventilation  Informed Consent: I have reviewed the patients History and Physical, chart, labs and discussed the procedure including the risks, benefits and alternatives for the proposed anesthesia with the patient or authorized representative who has indicated his/her understanding and acceptance.     Plan Discussed with: CRNA and Anesthesiologist  Anesthesia Plan Comments:         Anesthesia Quick Evaluation

## 2014-08-27 NOTE — Anesthesia Postprocedure Evaluation (Signed)
  Anesthesia Post-op Note  Patient: [name]  Procedure(s) Performed: Procedure(s) with comments: INTRAMEDULLARY (IM) NAIL FEMORAL (Left) OPEN REDUCTION INTERNAL FIXATION (ORIF) TIBIAL PLATEAU (Left) EXTERNAL FIXATION LEG/ WITH I&D (Right) - and upper leg  Patient Location: PACU and SICU  Anesthesia Type:General  Level of Consciousness: obtunded, comatose and Patient remains intubated per anesthesia plan  Airway and Oxygen Therapy: Patient remains intubated per anesthesia plan and Patient placed on Ventilator (see vital sign flow sheet for setting)  Post-op Pain: none  Post-op Assessment: Post-op Vital signs reviewed, Patient's Cardiovascular Status Stable, Respiratory Function Stable, Patent Airway, No signs of Nausea or vomiting and Pain level controlled LLE Motor Response: Other (Comment), Responds to commands (localize to pain)   RLE Motor Response: Other (Comment), Responds to commands (localize to pain)        Post-op Vital Signs: stable  Last Vitals:  Filed Vitals:   08/27/14 1300  BP: 131/64  Pulse: 105  Temp:   Resp: 13    Complications: No apparent anesthesia complications

## 2014-08-27 NOTE — Progress Notes (Signed)
RT called to bedside to assess PT's ETT with Everlean AlstromMaurice, RT. Pt went to OR today with #7.0 ETT in place. When she returned the airway had been ended by a CRNA in OR. There were no notes to support tube exchange. Pt still has a #7.0 ETT in place. I advised Everlean AlstromMaurice to leave the original airway as removed and enter a new airway with a note regarding this.

## 2014-08-27 NOTE — Brief Op Note (Signed)
08/24/2014 - 08/27/2014  6:39 PM  PATIENT:  [name]  49 y.o. female  PRE-OPERATIVE DIAGNOSIS:   1. Left femoral shaft fracture 2. Left medial tibial plateau fracture 3. Left leg spanning external fixator 4. Right grade 3A open left femur 5. Right grade 2 open left tibia 6. Right open grade 3A left calcaneus 7. Right open grade 3A subtalar and talonavicular dislocation 8. Right leg spanning external fixator  POST-OPERATIVE DIAGNOSIS:   1. Left femoral shaft fracture 2. Left medial tibial plateau fracture 3. Left leg spanning external fixator 4. Right grade 3A open left femur 5. Right grade 2 open left tibia 6. Right open grade 3A left calcaneus 7. Right open grade 3A subtalar and talonavicular dislocation 8. Right leg spanning external fixator 9. Right quadriceps tendon tear  PROCEDUREs:  1. RETROGRADE INTRAMEDULLARY (IM) NAIL FEMORAL (Left), 9x38440mm statically locked [place] 2. OPEN REDUCTION INTERNAL FIXATION (ORIF) TIBIAL PLATEAU (Left) medial plateau, 6.365mm Biomet cannulated screws 3. EXTERNAL FIXATION removal left leg 4. RIGHT OPEN FEMUR I&D INCLUDING BONE 5. RIGHT OPEN TIBIA I&D INCLUDING BONE 6. RIGHT OPEN CALCANEUS I&D INCLUDING BONE 7. APPLICATION OF WOUND VAC RIGHT HEEL, SMALL   SURGEON:  Surgeon(s) and Role:    * Myrene GalasMichael Ivory Bail, MD - Primary  PHYSICIAN ASSISTANT: Montez MoritaKeith Paul, PA-C  ANESTHESIA:   general  I/O:  Total I/O In: 2013 [I.V.:2013] Out: 900 [Urine:790; Blood:110]  SPECIMEN:  No Specimen  TOURNIQUET:  * No tourniquets in log *  DICTATION: .Other Dictation: Dictation Number [id number]

## 2014-08-27 NOTE — Progress Notes (Signed)
Follow up - Trauma and Critical Care  Patient Details:    [name] is an 49 y.o. female.  Lines/tubes : Airway (Active)  Secured at (cm) 23 cm 08/27/2014  7:36 AM  Measured From Lips 08/27/2014  7:36 AM  Secured Location Left 08/27/2014  7:36 AM  Secured By [employer] Tube Holder 08/27/2014  7:36 AM  Tube Holder Repositioned Yes 08/27/2014  7:36 AM  Cuff Pressure (cm H2O) 28 cm H2O 08/27/2014  3:18 AM  Site Condition Dry 08/27/2014  7:36 AM     Arterial Line 08/24/14 Left Brachial (Active)  Site Assessment Clean;Dry;Intact 08/26/2014  8:00 PM  Line Status Pulsatile blood flow 08/26/2014  8:00 PM  Art Line Waveform Appropriate 08/26/2014  8:00 PM  Art Line Interventions Zeroed and calibrated;Flushed per protocol;Connections checked and tightened 08/26/2014  8:00 PM  Color/Movement/Sensation Capillary refill less than 3 sec 08/26/2014  8:00 PM  Dressing Type Transparent 08/26/2014  8:00 PM  Dressing Status Clean;Dry;Intact 08/26/2014  8:00 PM     NG/OG Tube Orogastric 16 Fr. Right mouth (Active)  Placement Verification Auscultation 08/26/2014  8:00 PM  Site Assessment Clean;Dry;Intact 08/26/2014  8:00 PM  Status Clamped 08/26/2014  8:00 PM  Drainage Appearance Brown 08/26/2014  8:00 AM  Output (mL) 100 mL 08/25/2014  6:00 PM     Urethral Catheter A. Collins RN Latex;Straight-tip 16 Fr. (Active)  Indication for Insertion or Continuance of Catheter Unstable critical patients (first 24-48 hours) 08/26/2014  8:00 PM  Site Assessment Clean;Intact 08/26/2014  8:00 PM  Catheter Maintenance Bag below level of bladder 08/26/2014  8:00 PM  Collection Container Standard drainage bag 08/26/2014  8:00 PM  Securement Method Securing device (Describe) 08/26/2014  8:00 PM  Urinary Catheter Interventions Unclamped 08/26/2014  8:00 PM  Output (mL) 50 mL 08/27/2014  6:00 AM    Microbiology/Sepsis markers: Results for orders placed or performed during the hospital encounter of 08/24/14  MRSA PCR Screening      Status: None   Collection Time: 08/24/14  7:15 PM  Result Value Ref Range Status   MRSA by PCR NEGATIVE NEGATIVE Final    Comment:        The GeneXpert MRSA Assay (FDA approved for NASAL specimens only), is one component of a comprehensive MRSA colonization surveillance program. It is not intended to diagnose MRSA infection nor to guide or monitor treatment for MRSA infections.     Anti-infectives:  Anti-infectives    Start     Dose/Rate Route Frequency Ordered Stop   08/26/14 1400  ceFAZolin (ANCEF) IVPB 2 g/50 mL premix    Comments:  Will adjust duration after wounds assessed in OR tomorrow   2 g 100 mL/hr over 30 Minutes Intravenous 3 times per day 08/26/14 1147     08/24/14 1615  ceFAZolin (ANCEF) IVPB 2 g/50 mL premix  Status:  Discontinued     2 g 100 mL/hr over 30 Minutes Intravenous  Once 08/24/14 1602 08/26/14 1206      Best Practice/Protocols:  VTE Prophylaxis: Mechanical GI Prophylaxis: Proton Pump Inhibitor Continous Sedation  Consults: Treatment Team:  Kathryne Hitchhristopher Y Blackman, MD Myrene GalasMichael Handy, MD    Events:  Subjective:    Overnight Issues: Patient awakens and opens her eyes.  Will not follow commands.  Should go for surgery today per orthopedic surgery.  Objective:  Vital signs for last 24 hours: Temp:  [99.7 F (37.6 C)-102.2 F (39 C)] 99.7 F (37.6 C) (07/19 0743) Pulse Rate:  [109-130] 109 (07/19 0736)  Resp:  [9-21] 12 (07/19 0736) BP: (132-150)/(66-73) 141/69 mmHg (07/19 0736) SpO2:  [99 %-100 %] 100 % (07/19 0736) Arterial Line BP: (115-166)/(58-78) 151/68 mmHg (07/19 0700) FiO2 (%):  [30 %] 30 % (07/19 0736)  Hemodynamic parameters for last 24 hours:    Intake/Output from previous day: 07/18 0701 - 07/19 0700 In: 2903 [I.V.:2388; Blood:365; IV Piggyback:150] Out: 1310 [Urine:1310]  Intake/Output this shift:    Vent settings for last 24 hours: Vent Mode:  [-] PRVC FiO2 (%):  [30 %] 30 % Set Rate:  [12 bmp] 12 bmp Vt Set:   [550 mL] 550 mL PEEP:  [5 cmH20] 5 cmH20 Plateau Pressure:  [16 cmH20-22 cmH20] 22 cmH20  Physical Exam:  General: alert, no respiratory distress and not following commands for me, but did follow commands for the nurse. Neuro: alert, nonfocal exam and RASS 0 Resp: clear to auscultation bilaterally CVS: mildly tachycardic Extremities: edema 2+ and having extremities fixed by ORIF today.  Results for orders placed or performed during the hospital encounter of 08/24/14 (from the past 24 hour(s))  CBC     Status: Abnormal   Collection Time: 08/26/14  9:05 AM  Result Value Ref Range   WBC 11.4 (H) 4.0 - 10.5 K/uL   RBC 2.73 (L) 3.87 - 5.11 MIL/uL   Hemoglobin 8.1 (L) 12.0 - 15.0 g/dL   HCT 16.1 (L) 09.6 - 04.5 %   MCV 85.0 78.0 - 100.0 fL   MCH 29.7 26.0 - 34.0 pg   MCHC 34.9 30.0 - 36.0 g/dL   RDW 40.9 (H) 81.1 - 91.4 %   Platelets 95 (L) 150 - 400 K/uL  Comprehensive metabolic panel     Status: Abnormal   Collection Time: 08/26/14  9:05 AM  Result Value Ref Range   Sodium 143 135 - 145 mmol/L   Potassium 3.5 3.5 - 5.1 mmol/L   Chloride 115 (H) 101 - 111 mmol/L   CO2 23 22 - 32 mmol/L   Glucose, Bld 94 65 - 99 mg/dL   BUN 8 6 - 20 mg/dL   Creatinine, Ser 7.82 0.44 - 1.00 mg/dL   Calcium 6.9 (L) 8.9 - 10.3 mg/dL   Total Protein 4.3 (L) 6.5 - 8.1 g/dL   Albumin 2.4 (L) 3.5 - 5.0 g/dL   AST 956 (H) 15 - 41 U/L   ALT 71 (H) 14 - 54 U/L   Alkaline Phosphatase 31 (L) 38 - 126 U/L   Total Bilirubin 0.6 0.3 - 1.2 mg/dL   GFR calc non Af Amer >60 >60 mL/min   GFR calc Af Amer >60 >60 mL/min   Anion gap 5 5 - 15  Lactic acid, plasma     Status: None   Collection Time: 08/26/14 12:40 PM  Result Value Ref Range   Lactic Acid, Venous 0.7 0.5 - 2.0 mmol/L  CBC     Status: Abnormal   Collection Time: 08/26/14  9:50 PM  Result Value Ref Range   WBC 13.6 (H) 4.0 - 10.5 K/uL   RBC 3.33 (L) 3.87 - 5.11 MIL/uL   Hemoglobin 9.9 (L) 12.0 - 15.0 g/dL   HCT 21.3 (L) 08.6 - 57.8 %   MCV  86.8 78.0 - 100.0 fL   MCH 29.7 26.0 - 34.0 pg   MCHC 34.3 30.0 - 36.0 g/dL   RDW 46.9 (H) 62.9 - 52.8 %   Platelets 97 (L) 150 - 400 K/uL  APTT     Status: None  Collection Time: 08/27/14  5:15 AM  Result Value Ref Range   aPTT 29 24 - 37 seconds  Protime-INR     Status: Abnormal   Collection Time: 08/27/14  5:15 AM  Result Value Ref Range   Prothrombin Time 16.9 (H) 11.6 - 15.2 seconds   INR 1.36 0.00 - 1.49  Comprehensive metabolic panel     Status: Abnormal   Collection Time: 08/27/14  5:15 AM  Result Value Ref Range   Sodium 141 135 - 145 mmol/L   Potassium 3.4 (L) 3.5 - 5.1 mmol/L   Chloride 113 (H) 101 - 111 mmol/L   CO2 22 22 - 32 mmol/L   Glucose, Bld 85 65 - 99 mg/dL   BUN 6 6 - 20 mg/dL   Creatinine, Ser 1.19 0.44 - 1.00 mg/dL   Calcium 7.4 (L) 8.9 - 10.3 mg/dL   Total Protein 4.6 (L) 6.5 - 8.1 g/dL   Albumin 2.3 (L) 3.5 - 5.0 g/dL   AST 147 (H) 15 - 41 U/L   ALT 59 (H) 14 - 54 U/L   Alkaline Phosphatase 37 (L) 38 - 126 U/L   Total Bilirubin 1.1 0.3 - 1.2 mg/dL   GFR calc non Af Amer >60 >60 mL/min   GFR calc Af Amer >60 >60 mL/min   Anion gap 6 5 - 15  Lactic acid, plasma     Status: None   Collection Time: 08/27/14  5:15 AM  Result Value Ref Range   Lactic Acid, Venous 0.7 0.5 - 2.0 mmol/L     Assessment/Plan:   NEURO  Altered Mental Status:  agitation and sedation   Plan: Keep sedated for surgery   PULM  Atelectasis/collapse (focal and left side)   Plan: Repeat CXR tomorrow.  CARDIO  Sinus Tachycardia   Plan: Physiologic, no specific treatment  RENAL  Good urine output and renal function.   Plan: CPM  GI  Will consider tube feedings postoperatively if not able to wean    Plan: consider tube feedings.  ID  No known infectious problems.CPM   Plan: No changes  HEME  Anemia acute blood loss anemia)   Plan: No blood for now.  ENDO No specific issues   Plan: CPM  Global Issues  Will work on weaning after the patient comes back from surgery  over the next couple of days.    LOS: 3 days   Additional comments:I reviewed the patient's new clinical lab test results. cbc/bmet and I reviewed the patients new imaging test results. cxr from yesterday.  Critical Care Total Time*: 30 Minutes  Anajulia Leyendecker 08/27/2014  *Care during the described time interval was provided by me and/or other providers on the critical care team.  I have reviewed this patient's available data, including medical history, events of note, physical examination and test results as part of my evaluation.

## 2014-08-27 NOTE — Progress Notes (Signed)
RT transported pt with RN and CRNA to the OR. RT manually ventilated pt to the front desk area of the OR until the CRNA took over. Pt sat and vitals were stable throughout the trip. No apparent complications. RT will continue to monitor once the pt is back in Neuro 3M07.

## 2014-08-27 NOTE — Progress Notes (Deleted)
Wound Vac leaking. [facility]Dr [facility]). MD says leak cannot be fixed tonight.Will continue to monitor.

## 2014-08-28 ENCOUNTER — Inpatient Hospital Stay (HOSPITAL_COMMUNITY): Payer: Medicaid Other

## 2014-08-28 LAB — CBC
HCT: 25.3 % — ABNORMAL LOW (ref 36.0–46.0)
Hemoglobin: 8.5 g/dL — ABNORMAL LOW (ref 12.0–15.0)
MCH: 29.3 pg (ref 26.0–34.0)
MCHC: 33.6 g/dL (ref 30.0–36.0)
MCV: 87.2 fL (ref 78.0–100.0)
Platelets: 124 10*3/uL — ABNORMAL LOW (ref 150–400)
RBC: 2.9 MIL/uL — ABNORMAL LOW (ref 3.87–5.11)
RDW: 16.1 % — ABNORMAL HIGH (ref 11.5–15.5)
WBC: 12.2 10*3/uL — ABNORMAL HIGH (ref 4.0–10.5)

## 2014-08-28 LAB — GLUCOSE, CAPILLARY
Glucose-Capillary: 80 mg/dL (ref 65–99)
Glucose-Capillary: 103 mg/dL — ABNORMAL HIGH (ref 65–99)
Glucose-Capillary: 87 mg/dL (ref 65–99)

## 2014-08-28 LAB — TYPE AND SCREEN
ABO/RH(D): A POS
Antibody Screen: NEGATIVE
Unit division: 0
Unit division: 0
Unit division: 0
Unit division: 0
Unit division: 0
Unit division: 0
Unit division: 0
Unit division: 0
Unit division: 0
Unit division: 0

## 2014-08-28 LAB — POCT I-STAT 7, (LYTES, BLD GAS, ICA,H+H)
Acid-base deficit: 4 mmol/L — ABNORMAL HIGH (ref 0.0–2.0)
Bicarbonate: 20.4 mEq/L (ref 20.0–24.0)
Calcium, Ion: 1.14 mmol/L (ref 1.12–1.23)
HCT: 25 % — ABNORMAL LOW (ref 36.0–46.0)
Hemoglobin: 8.5 g/dL — ABNORMAL LOW (ref 12.0–15.0)
O2 Saturation: 100 %
Patient temperature: 36.3
Potassium: 3.2 mmol/L — ABNORMAL LOW (ref 3.5–5.1)
Sodium: 141 mmol/L (ref 135–145)
TCO2: 21 mmol/L (ref 0–100)
pCO2 arterial: 33.5 mmHg — ABNORMAL LOW (ref 35.0–45.0)
pH, Arterial: 7.39 (ref 7.350–7.450)
pO2, Arterial: 191 mmHg — ABNORMAL HIGH (ref 80.0–100.0)

## 2014-08-28 LAB — PREPARE RBC (CROSSMATCH)

## 2014-08-28 LAB — TRIGLYCERIDES: Triglycerides: 747 mg/dL — ABNORMAL HIGH (ref <150)

## 2014-08-28 LAB — POCT I-STAT 4, (NA,K, GLUC, HGB,HCT)
Glucose, Bld: 73 mg/dL (ref 65–99)
HCT: 25 % — ABNORMAL LOW (ref 36.0–46.0)
Hemoglobin: 8.5 g/dL — ABNORMAL LOW (ref 12.0–15.0)
Potassium: 3.3 mmol/L — ABNORMAL LOW (ref 3.5–5.1)
Sodium: 140 mmol/L (ref 135–145)

## 2014-08-28 MED ORDER — PIVOT 1.5 CAL PO LIQD
1000.0000 mL | ORAL | Status: DC
  Administered 2014-08-28 – 2014-09-02 (×6): 1000 mL
  Filled 2014-08-28 (×11): qty 1000

## 2014-08-28 MED ORDER — MIDAZOLAM HCL 5 MG/ML IJ SOLN
0.0000 mg/h | INTRAMUSCULAR | Status: DC
  Administered 2014-08-28: 4 mg/h via INTRAVENOUS
  Administered 2014-08-28: 2 mg/h via INTRAVENOUS
  Administered 2014-08-30: 4 mg/h via INTRAVENOUS
  Administered 2014-08-30 – 2014-08-31 (×3): 2 mg/h via INTRAVENOUS
  Administered 2014-09-01 (×2): 3 mg/h via INTRAVENOUS
  Administered 2014-09-02: 3.5 mg/h via INTRAVENOUS
  Administered 2014-09-02: 2 mg/h via INTRAVENOUS
  Filled 2014-08-28 (×11): qty 10

## 2014-08-28 MED ORDER — ADULT MULTIVITAMIN LIQUID CH
5.0000 mL | Freq: Every day | ORAL | Status: DC
  Administered 2014-08-28 – 2014-09-13 (×15): 5 mL
  Filled 2014-08-28 (×18): qty 5

## 2014-08-28 MED ORDER — MIDAZOLAM BOLUS VIA INFUSION
1.0000 mg | INTRAVENOUS | Status: DC | PRN
  Filled 2014-08-28: qty 2

## 2014-08-28 MED ORDER — PIVOT 1.5 CAL PO LIQD
1000.0000 mL | ORAL | Status: DC
  Filled 2014-08-28 (×2): qty 1000

## 2014-08-28 MED ORDER — PRO-STAT SUGAR FREE PO LIQD
30.0000 mL | Freq: Three times a day (TID) | ORAL | Status: DC
  Administered 2014-08-28 – 2014-09-03 (×16): 30 mL
  Filled 2014-08-28 (×19): qty 30

## 2014-08-28 MED ORDER — SODIUM CHLORIDE 0.9 % IV SOLN
Freq: Once | INTRAVENOUS | Status: AC
  Administered 2014-09-06: 10:00:00 via INTRAVENOUS

## 2014-08-28 NOTE — Care Management Note (Signed)
Case Management Note  Patient Details  Name: [name] MRN: [id number] Date of Birth: May 11, 1965  Subjective/Objective:    Pt remains sedated and on ventilator;  S/p bilateral leg surgeries yesterday with plans to return to OR on 08/29/14 for further surgery.                 Action/Plan: Plan vent wean post-op.  Will follow progress.    Expected Discharge Date: unknown           Expected Discharge Plan:  IP Rehab Facility  In-House Referral:  Clinical Social Work  Discharge planning Services  CM Consult  Post Acute Care Choice:    Choice offered to:     DME Arranged:    DME Agency:     HH Arranged:    HH Agency:     Status of Service:  In process, will continue to follow  Medicare Important Message Given:    Date Medicare IM Given:    Medicare IM give by:    Date Additional Medicare IM Given:    Additional Medicare Important Message give by:     If discussed at Long Length of Stay Meetings, dates discussed:    Additional Comments:  Quintella BatonJulie W. Bo Teicher, RN, BSN  Trauma/Neuro ICU Case Manager [phone]
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
## 2014-08-28 NOTE — Progress Notes (Signed)
Spoke with Montez MoritaKeith Paul, PA about pt's temperature being 100.7 axillary.  Asked if blood still should be administered.  Mellody DanceKeith stated yes he would prefer for it to be administered and just to monitor her closely.  Will continue to monitor pt.
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
## 2014-08-29 ENCOUNTER — Encounter (HOSPITAL_COMMUNITY): Admission: EM | Disposition: A | Discharge status: Skilled Nursing Facility | Payer: Self-pay | Source: Home / Self Care

## 2014-08-29 ENCOUNTER — Inpatient Hospital Stay (HOSPITAL_COMMUNITY): Payer: Medicaid Other | Admitting: Anesthesiology

## 2014-08-29 ENCOUNTER — Encounter (HOSPITAL_COMMUNITY): Payer: Self-pay | Admitting: Anesthesiology

## 2014-08-29 ENCOUNTER — Inpatient Hospital Stay (HOSPITAL_COMMUNITY): Payer: Medicaid Other

## 2014-08-29 HISTORY — PX: I & D EXTREMITY: SHX5045

## 2014-08-29 HISTORY — PX: EXTERNAL FIXATION REMOVAL: SHX5040

## 2014-08-29 HISTORY — PX: QUADRICEPS TENDON REPAIR: SHX756

## 2014-08-29 HISTORY — PX: TIBIA IM NAIL INSERTION: SHX2516

## 2014-08-29 HISTORY — PX: ORIF FEMUR FRACTURE: SHX2119

## 2014-08-29 LAB — BASIC METABOLIC PANEL
Anion gap: 5 (ref 5–15)
Anion gap: 6 (ref 5–15)
BUN: 6 mg/dL (ref 6–20)
BUN: <5 mg/dL — ABNORMAL LOW (ref 6–20)
CO2: 26 mmol/L (ref 22–32)
CO2: 27 mmol/L (ref 22–32)
Calcium: 7.5 mg/dL — ABNORMAL LOW (ref 8.9–10.3)
Calcium: 7.7 mg/dL — ABNORMAL LOW (ref 8.9–10.3)
Chloride: 113 mmol/L — ABNORMAL HIGH (ref 101–111)
Chloride: 108 mmol/L (ref 101–111)
Creatinine, Ser: 0.61 mg/dL (ref 0.44–1.00)
Creatinine, Ser: 0.55 mg/dL (ref 0.44–1.00)
GFR calc Af Amer: >60 mL/min (ref >60)
GFR calc Af Amer: >60 mL/min (ref >60)
GFR calc non Af Amer: >60 mL/min (ref >60)
GFR calc non Af Amer: >60 mL/min (ref >60)
Glucose, Bld: 162 mg/dL — ABNORMAL HIGH (ref 65–99)
Glucose, Bld: 112 mg/dL — ABNORMAL HIGH (ref 65–99)
Potassium: 3.2 mmol/L — ABNORMAL LOW (ref 3.5–5.1)
Potassium: 3.7 mmol/L (ref 3.5–5.1)
Sodium: 144 mmol/L (ref 135–145)
Sodium: 141 mmol/L (ref 135–145)

## 2014-08-29 LAB — TYPE AND SCREEN
ABO/RH(D): A POS
Antibody Screen: NEGATIVE
Unit division: 0
Unit division: 0

## 2014-08-29 LAB — GLUCOSE, CAPILLARY
Glucose-Capillary: 114 mg/dL — ABNORMAL HIGH (ref 65–99)
Glucose-Capillary: 115 mg/dL — ABNORMAL HIGH (ref 65–99)
Glucose-Capillary: 118 mg/dL — ABNORMAL HIGH (ref 65–99)
Glucose-Capillary: 146 mg/dL — ABNORMAL HIGH (ref 65–99)

## 2014-08-29 LAB — GRAM STAIN

## 2014-08-29 LAB — CBC
HCT: 31.8 % — ABNORMAL LOW (ref 36.0–46.0)
Hemoglobin: 10.8 g/dL — ABNORMAL LOW (ref 12.0–15.0)
MCH: 29.8 pg (ref 26.0–34.0)
MCHC: 34 g/dL (ref 30.0–36.0)
MCV: 87.6 fL (ref 78.0–100.0)
Platelets: 132 10*3/uL — ABNORMAL LOW (ref 150–400)
RBC: 3.63 MIL/uL — ABNORMAL LOW (ref 3.87–5.11)
RDW: 15.6 % — ABNORMAL HIGH (ref 11.5–15.5)
WBC: 15.3 10*3/uL — ABNORMAL HIGH (ref 4.0–10.5)

## 2014-08-29 SURGERY — REMOVAL, EXTERNAL FIXATION DEVICE, LOWER EXTREMITY
Anesthesia: General | Site: Leg Upper | Laterality: Right

## 2014-08-29 MED ORDER — VANCOMYCIN HCL 1000 MG IV SOLR
INTRAVENOUS | Status: DC | PRN
Start: 2014-08-29 — End: 2014-08-29
  Administered 2014-08-29: 2000 mg

## 2014-08-29 MED ORDER — FENTANYL CITRATE (PF) 100 MCG/2ML IJ SOLN
INTRAMUSCULAR | Status: DC | PRN
  Administered 2014-08-29: 50 ug via INTRAVENOUS
  Administered 2014-08-29: 100 ug via INTRAVENOUS
  Administered 2014-08-29: 250 ug via INTRAVENOUS
  Administered 2014-08-29: 100 ug via INTRAVENOUS

## 2014-08-29 MED ORDER — PROPOFOL 10 MG/ML IV BOLUS
INTRAVENOUS | Status: AC
  Filled 2014-08-29: qty 20

## 2014-08-29 MED ORDER — LACTATED RINGERS IV SOLN
INTRAVENOUS | Status: DC | PRN
  Administered 2014-08-29 (×3): via INTRAVENOUS

## 2014-08-29 MED ORDER — CEFAZOLIN SODIUM-DEXTROSE 2-3 GM-% IV SOLR
2.0000 g | Freq: Three times a day (TID) | INTRAVENOUS | Status: DC
  Administered 2014-08-29 – 2014-09-01 (×8): 2 g via INTRAVENOUS
  Filled 2014-08-29 (×10): qty 50

## 2014-08-29 MED ORDER — ROCURONIUM BROMIDE 50 MG/5ML IV SOLN
INTRAVENOUS | Status: AC
  Filled 2014-08-29: qty 3

## 2014-08-29 MED ORDER — ARTIFICIAL TEARS OP OINT
TOPICAL_OINTMENT | OPHTHALMIC | Status: AC
  Filled 2014-08-29: qty 3.5

## 2014-08-29 MED ORDER — SODIUM CHLORIDE 0.9 % IR SOLN
Status: DC | PRN
  Administered 2014-08-29: 9000 mL

## 2014-08-29 MED ORDER — STERILE WATER FOR INJECTION IJ SOLN
INTRAMUSCULAR | Status: AC
  Filled 2014-08-29: qty 10

## 2014-08-29 MED ORDER — PROPOFOL 10 MG/ML IV BOLUS
INTRAVENOUS | Status: DC | PRN
  Administered 2014-08-29 (×2): 50 mg via INTRAVENOUS

## 2014-08-29 MED ORDER — TOBRAMYCIN SULFATE 1.2 G IJ SOLR
INTRAMUSCULAR | Status: DC | PRN
  Administered 2014-08-29: 2.4 g

## 2014-08-29 MED ORDER — MIDAZOLAM HCL 5 MG/5ML IJ SOLN
INTRAMUSCULAR | Status: DC | PRN
  Administered 2014-08-29: 2 mg via INTRAVENOUS

## 2014-08-29 MED ORDER — POTASSIUM CHLORIDE 10 MEQ/50ML IV SOLN
10.0000 meq | INTRAVENOUS | Status: DC
  Filled 2014-08-29: qty 50

## 2014-08-29 MED ORDER — MIDAZOLAM HCL 2 MG/2ML IJ SOLN
INTRAMUSCULAR | Status: AC
  Filled 2014-08-29: qty 2

## 2014-08-29 MED ORDER — ROCURONIUM BROMIDE 100 MG/10ML IV SOLN
INTRAVENOUS | Status: DC | PRN
  Administered 2014-08-29: 30 mg via INTRAVENOUS
  Administered 2014-08-29 (×5): 10 mg via INTRAVENOUS
  Administered 2014-08-29: 50 mg via INTRAVENOUS
  Administered 2014-08-29 (×2): 10 mg via INTRAVENOUS

## 2014-08-29 MED ORDER — KETAMINE HCL 100 MG/ML IJ SOLN
INTRAMUSCULAR | Status: DC | PRN
  Administered 2014-08-29 (×2): 25 mg via INTRAVENOUS
  Administered 2014-08-29 (×5): 50 mg via INTRAVENOUS
  Administered 2014-08-29: 25 mg via INTRAVENOUS
  Administered 2014-08-29 (×2): 50 mg via INTRAVENOUS
  Administered 2014-08-29: 25 mg via INTRAVENOUS
  Administered 2014-08-29: 50 mg via INTRAVENOUS

## 2014-08-29 MED ORDER — FENTANYL CITRATE (PF) 250 MCG/5ML IJ SOLN
INTRAMUSCULAR | Status: AC
  Filled 2014-08-29: qty 5

## 2014-08-29 MED ORDER — GENTAMICIN SULFATE 40 MG/ML IJ SOLN
500.0000 mg | INTRAMUSCULAR | Status: AC
  Administered 2014-08-29 – 2014-08-31 (×3): 500 mg via INTRAVENOUS
  Filled 2014-08-29 (×3): qty 12.5

## 2014-08-29 MED ORDER — PHENYLEPHRINE HCL 10 MG/ML IJ SOLN
INTRAMUSCULAR | Status: DC | PRN
  Administered 2014-08-29: 40 ug via INTRAVENOUS

## 2014-08-29 MED ORDER — ONDANSETRON HCL 4 MG/2ML IJ SOLN
INTRAMUSCULAR | Status: AC
  Filled 2014-08-29: qty 2

## 2014-08-29 MED ORDER — POTASSIUM CHLORIDE 10 MEQ/50ML IV SOLN
INTRAVENOUS | Status: AC
  Administered 2014-08-29: 10 meq
  Filled 2014-08-29: qty 200

## 2014-08-29 MED ORDER — SODIUM CHLORIDE 0.9 % IJ SOLN
INTRAMUSCULAR | Status: AC
  Filled 2014-08-29: qty 10

## 2014-08-29 MED ORDER — PROPOFOL INFUSION 10 MG/ML OPTIME
INTRAVENOUS | Status: DC | PRN
  Administered 2014-08-29: 50 ug/kg/min via INTRAVENOUS

## 2014-08-29 MED ORDER — VECURONIUM BROMIDE 10 MG IV SOLR
INTRAVENOUS | Status: AC
  Filled 2014-08-29: qty 10

## 2014-08-29 MED ORDER — ALBUMIN HUMAN 5 % IV SOLN
INTRAVENOUS | Status: DC | PRN
  Administered 2014-08-29: 13:00:00 via INTRAVENOUS

## 2014-08-29 MED ORDER — FENTANYL CITRATE (PF) 250 MCG/5ML IJ SOLN
INTRAMUSCULAR | Status: AC
Start: 2014-08-29 — End: 2014-08-29
  Filled 2014-08-29: qty 5

## 2014-08-29 MED ORDER — VECURONIUM BROMIDE 10 MG IV SOLR
INTRAVENOUS | Status: DC | PRN
  Administered 2014-08-29: 2 mg via INTRAVENOUS

## 2014-08-29 MED ORDER — GENTAMICIN IN SALINE 1.6-0.9 MG/ML-% IV SOLN
80.0000 mg | INTRAVENOUS | Status: AC
  Administered 2014-08-29: 80 mg via INTRAVENOUS
  Filled 2014-08-29: qty 50

## 2014-08-29 MED ORDER — VECURONIUM BROMIDE 10 MG IV SOLR
INTRAVENOUS | Status: AC
Start: 2014-08-29 — End: 2014-08-29
  Filled 2014-08-29: qty 10

## 2014-08-29 MED ORDER — CEFAZOLIN SODIUM-DEXTROSE 2-3 GM-% IV SOLR
INTRAVENOUS | Status: AC
  Filled 2014-08-29: qty 100

## 2014-08-29 MED ORDER — PHENYLEPHRINE 40 MCG/ML (10ML) SYRINGE FOR IV PUSH (FOR BLOOD PRESSURE SUPPORT)
PREFILLED_SYRINGE | INTRAVENOUS | Status: AC
  Filled 2014-08-29: qty 10

## 2014-08-29 MED ORDER — POTASSIUM CHLORIDE 10 MEQ/100ML IV SOLN
INTRAVENOUS | Status: DC | PRN
  Administered 2014-08-29 (×4): 10 meq via INTRAVENOUS

## 2014-08-29 SURGICAL SUPPLY — 134 items
BANDAGE ELASTIC 4 VELCRO ST LF (GAUZE/BANDAGES/DRESSINGS) ×5 IMPLANT
BANDAGE ELASTIC 6 VELCRO ST LF (GAUZE/BANDAGES/DRESSINGS) ×5 IMPLANT
BANDAGE ESMARK 6X9 LF (GAUZE/BANDAGES/DRESSINGS) ×3 IMPLANT
BIT DRILL 3.8X6 NS (BIT) ×2 IMPLANT
BIT DRILL 4.4 NS (BIT) ×2 IMPLANT
BIT DRILL CALIBRATED 3.2MM (DRILL) IMPLANT
BIT DRILL GUIDEWIRE 2.5X200 (WIRE) ×10 IMPLANT
BIT DRILL PERCUTANEOUS 4.3 (BIT) ×3
BIT DRILL PERCUTANEOUS 4.3MM (BIT) IMPLANT
BLADE SURG 10 STRL SS (BLADE) ×10 IMPLANT
BLADE SURG ROTATE 9660 (MISCELLANEOUS) IMPLANT
BNDG CMPR 9X6 STRL LF SNTH (GAUZE/BANDAGES/DRESSINGS) ×3
BNDG COHESIVE 4X5 TAN STRL (GAUZE/BANDAGES/DRESSINGS) ×5 IMPLANT
BNDG COHESIVE 6X5 TAN STRL LF (GAUZE/BANDAGES/DRESSINGS) ×5 IMPLANT
BNDG ESMARK 6X9 LF (GAUZE/BANDAGES/DRESSINGS) ×5
BNDG GAUZE ELAST 4 BULKY (GAUZE/BANDAGES/DRESSINGS) ×8 IMPLANT
BNDG GAUZE STRTCH 6 (GAUZE/BANDAGES/DRESSINGS) ×15 IMPLANT
BONE CEMENT PALACOSE (Orthopedic Implant) ×5 IMPLANT
BRUSH SCRUB DISP (MISCELLANEOUS) ×10 IMPLANT
BUR EGG ELITE 4.0 (BURR) ×1 IMPLANT
BUR EGG ELITE 4.0MM (BURR) ×1
CANISTER SUCT 3000ML PPV (MISCELLANEOUS) ×5 IMPLANT
CANISTER WOUND CARE 500ML ATS (WOUND CARE) ×2 IMPLANT
CEMENT BONE PALACOSE (Orthopedic Implant) IMPLANT
CLEANER TIP ELECTROSURG 2X2 (MISCELLANEOUS) ×5 IMPLANT
CLOSURE WOUND 1/2 X4 (GAUZE/BANDAGES/DRESSINGS) ×1
COVER SURGICAL LIGHT HANDLE (MISCELLANEOUS) ×10 IMPLANT
CUFF TOURNIQUET SINGLE 18IN (TOURNIQUET CUFF) IMPLANT
CUFF TOURNIQUET SINGLE 24IN (TOURNIQUET CUFF) IMPLANT
CUFF TOURNIQUET SINGLE 34IN LL (TOURNIQUET CUFF) IMPLANT
DRAPE C-ARM 42X72 X-RAY (DRAPES) ×5 IMPLANT
DRAPE C-ARMOR (DRAPES) ×5 IMPLANT
DRAPE EXTREMITY T 121X128X90 (DRAPE) ×10 IMPLANT
DRAPE IMP U-DRAPE 54X76 (DRAPES) ×5 IMPLANT
DRAPE INCISE IOBAN 66X45 STRL (DRAPES) IMPLANT
DRAPE OEC MINIVIEW 54X84 (DRAPES) ×5 IMPLANT
DRAPE ORTHO SPLIT 77X108 STRL (DRAPES) ×20
DRAPE SURG ORHT 6 SPLT 77X108 (DRAPES) ×9 IMPLANT
DRAPE U-SHAPE 47X51 STRL (DRAPES) ×5 IMPLANT
DRILL BIT PERCUTANEOUS 4.3MM (BIT) ×5
DRILL CALIBRATED 3.2MM (DRILL) ×5
DRSG ADAPTIC 3X8 NADH LF (GAUZE/BANDAGES/DRESSINGS) ×5 IMPLANT
DRSG MEPITEL 4X7.2 (GAUZE/BANDAGES/DRESSINGS) ×2 IMPLANT
DRSG PAD ABDOMINAL 8X10 ST (GAUZE/BANDAGES/DRESSINGS) ×14 IMPLANT
DRSG VAC ATS SM SENSATRAC (GAUZE/BANDAGES/DRESSINGS) ×2 IMPLANT
ELECT CAUTERY BLADE 6.4 (BLADE) IMPLANT
ELECT REM PT RETURN 9FT ADLT (ELECTROSURGICAL) ×5
ELECTRODE REM PT RTRN 9FT ADLT (ELECTROSURGICAL) ×3 IMPLANT
EVACUATOR 1/8 PVC DRAIN (DRAIN) IMPLANT
EVACUATOR 3/16  PVC DRAIN (DRAIN)
EVACUATOR 3/16 PVC DRAIN (DRAIN) IMPLANT
GAUZE SPONGE 4X4 12PLY STRL (GAUZE/BANDAGES/DRESSINGS) ×5 IMPLANT
GLOVE BIO SURGEON STRL SZ 6.5 (GLOVE) ×2 IMPLANT
GLOVE BIO SURGEON STRL SZ7.5 (GLOVE) ×5 IMPLANT
GLOVE BIO SURGEON STRL SZ8 (GLOVE) ×5 IMPLANT
GLOVE BIO SURGEON STRL SZ8.5 (GLOVE) ×5 IMPLANT
GLOVE BIO SURGEONS STRL SZ 6.5 (GLOVE) ×2
GLOVE BIOGEL PI IND STRL 7.0 (GLOVE) IMPLANT
GLOVE BIOGEL PI IND STRL 7.5 (GLOVE) ×3 IMPLANT
GLOVE BIOGEL PI IND STRL 8 (GLOVE) ×3 IMPLANT
GLOVE BIOGEL PI INDICATOR 7.0 (GLOVE) ×6
GLOVE BIOGEL PI INDICATOR 7.5 (GLOVE) ×2
GLOVE BIOGEL PI INDICATOR 8 (GLOVE) ×2
GLOVE BIOGEL PI ORTHO PRO SZ8 (GLOVE) ×6
GLOVE PI ORTHO PRO STRL SZ8 (GLOVE) IMPLANT
GOWN STRL REUS W/ TWL LRG LVL3 (GOWN DISPOSABLE) ×6 IMPLANT
GOWN STRL REUS W/ TWL XL LVL3 (GOWN DISPOSABLE) ×3 IMPLANT
GOWN STRL REUS W/TWL LRG LVL3 (GOWN DISPOSABLE) ×10
GOWN STRL REUS W/TWL XL LVL3 (GOWN DISPOSABLE) ×5
GUIDEWIRE BALL NOSE 80CM (WIRE) ×2 IMPLANT
HANDPIECE INTERPULSE COAX TIP (DISPOSABLE)
K-WIRE DBL TROCAR .045X4 ×5 IMPLANT
K-WIRE DBL TROCAR .062X4 ×25 IMPLANT
KIT BASIN OR (CUSTOM PROCEDURE TRAY) ×5 IMPLANT
KIT INFUSE MEDIUM (Orthopedic Implant) ×2 IMPLANT
KIT ROOM TURNOVER OR (KITS) ×5 IMPLANT
KIT STIMULAN RAPID CURE  10CC (Orthopedic Implant) ×2 IMPLANT
KIT STIMULAN RAPID CURE 10CC (Orthopedic Implant) IMPLANT
KWIRE DBL TROCAR .045X4 IMPLANT
KWIRE DBL TROCAR .062X4 IMPLANT
MANIFOLD NEPTUNE II (INSTRUMENTS) ×5 IMPLANT
NAIL TIBIAL 8MMX31.5CM (Nail) ×2 IMPLANT
NEEDLE 22X1 1/2 (OR ONLY) (NEEDLE) IMPLANT
NS IRRIG 1000ML POUR BTL (IV SOLUTION) ×5 IMPLANT
PACK GENERAL/GYN (CUSTOM PROCEDURE TRAY) ×5 IMPLANT
PACK ORTHO EXTREMITY (CUSTOM PROCEDURE TRAY) ×5 IMPLANT
PACK TOTAL JOINT (CUSTOM PROCEDURE TRAY) ×5 IMPLANT
PACK UNIVERSAL I (CUSTOM PROCEDURE TRAY) ×5 IMPLANT
PAD ARMBOARD 7.5X6 YLW CONV (MISCELLANEOUS) ×10 IMPLANT
PAD CAST 4YDX4 CTTN HI CHSV (CAST SUPPLIES) ×3 IMPLANT
PADDING CAST COTTON 4X4 STRL (CAST SUPPLIES) ×5
PADDING CAST COTTON 6X4 STRL (CAST SUPPLIES) ×15 IMPLANT
PLATE CVD RT 16H  COND (Plate) ×2 IMPLANT
PLATE CVD RT 16H COND (Plate) IMPLANT
SCREW ACECAP 28MM (Screw) ×2 IMPLANT
SCREW ACECAP 34MM (Screw) ×2 IMPLANT
SCREW CANN LOCKING VA 5.0X75MM (Screw) ×6 IMPLANT
SCREW CANN LOCKING VA 5.0X80MM (Screw) ×2 IMPLANT
SCREW CORTEX ST 4.5X30 (Screw) ×2 IMPLANT
SCREW CORTEX ST 4.5X36 (Screw) ×2 IMPLANT
SCREW LOCK VA 5.0X30 (Screw) ×4 IMPLANT
SCREW PROXIMAL DEPUY (Screw) ×10 IMPLANT
SCREW PRXML FT 45X5.5XLCK NS (Screw) IMPLANT
SCREW PRXML FT 60X5.5XNS LF (Screw) IMPLANT
SCREW VA CANN LOCKING 5.0X70MM (Screw) ×4 IMPLANT
SET HNDPC FAN SPRY TIP SCT (DISPOSABLE) IMPLANT
SPONGE LAP 18X18 X RAY DECT (DISPOSABLE) ×5 IMPLANT
SPONGE SCRUB IODOPHOR (GAUZE/BANDAGES/DRESSINGS) ×5 IMPLANT
STAPLER VISISTAT 35W (STAPLE) ×5 IMPLANT
STOCKINETTE IMPERVIOUS 9X36 MD (GAUZE/BANDAGES/DRESSINGS) ×5 IMPLANT
STOCKINETTE IMPERVIOUS LG (DRAPES) ×5 IMPLANT
STRIP CLOSURE SKIN 1/2X4 (GAUZE/BANDAGES/DRESSINGS) ×4 IMPLANT
SUCTION FRAZIER TIP 10 FR DISP (SUCTIONS) ×5 IMPLANT
SUT ETHILON 3 0 PS 1 (SUTURE) IMPLANT
SUT PDS AB 2-0 CT1 27 (SUTURE) IMPLANT
SUT PROLENE 0 CT 2 (SUTURE) IMPLANT
SUT VIC AB 0 CT1 27 (SUTURE) ×10
SUT VIC AB 0 CT1 27XBRD ANBCTR (SUTURE) ×6 IMPLANT
SUT VIC AB 1 CT1 27 (SUTURE) ×10
SUT VIC AB 1 CT1 27XBRD ANBCTR (SUTURE) ×6 IMPLANT
SUT VIC AB 2-0 CT1 27 (SUTURE) ×10
SUT VIC AB 2-0 CT1 TAPERPNT 27 (SUTURE) ×6 IMPLANT
SUT VIC AB 2-0 CT3 27 (SUTURE) IMPLANT
SYR 20ML ECCENTRIC (SYRINGE) IMPLANT
SYR CONTROL 10ML LL (SYRINGE) IMPLANT
TOWEL OR 17X24 6PK STRL BLUE (TOWEL DISPOSABLE) ×10 IMPLANT
TOWEL OR 17X26 10 PK STRL BLUE (TOWEL DISPOSABLE) ×10 IMPLANT
TRAY FOLEY CATH 16FRSI W/METER (SET/KITS/TRAYS/PACK) ×5 IMPLANT
TUBE ANAEROBIC SPECIMEN COL (MISCELLANEOUS) IMPLANT
TUBE CONNECTING 12'X1/4 (SUCTIONS) ×1
TUBE CONNECTING 12X1/4 (SUCTIONS) ×4 IMPLANT
UNDERPAD 30X30 INCONTINENT (UNDERPADS AND DIAPERS) ×5 IMPLANT
WATER STERILE IRR 1000ML POUR (IV SOLUTION) ×10 IMPLANT
YANKAUER SUCT BULB TIP NO VENT (SUCTIONS) ×5 IMPLANT

## 2014-08-29 NOTE — Progress Notes (Signed)
Et advanced per Dr Lindie Spruce to 21cm. BBS equal

## 2014-08-29 NOTE — Transfer of Care (Signed)
Immediate Anesthesia Transfer of Care Note  Patient: [name]  Procedure(s) Performed: Procedure(s): REMOVAL EXTERNAL FIXATION LEG (Right) OPEN REDUCTION INTERNAL FIXATION (ORIF) DISTAL FEMUR FRACTURE (Right) INTRAMEDULLARY (IM) NAIL TIBIAL (Right) IRRIGATION AND DEBRIDEMENT RIGHT FOOT (Right) REPAIR QUADRICEP TENDON (Right)  Patient Location: ICU  Anesthesia Type:General  Level of Consciousness: sedated, unresponsive and Patient remains intubated per anesthesia plan  Airway & Oxygen Therapy: Patient remains intubated per anesthesia plan and Patient placed on Ventilator (see vital sign flow sheet for setting)  Post-op Assessment: Report given to RN and Post -op Vital signs reviewed and stable  Post vital signs: Reviewed and stable  Last Vitals:  Filed Vitals:   08/29/14 0810  BP: 130/66  Pulse: 115  Temp:   Resp: 14    Complications: No apparent anesthesia complications

## 2014-08-29 NOTE — Brief Op Note (Signed)
08/24/2014 - 08/29/2014  4:02 PM  PATIENT:  [name]  49 y.o. female  PRE-OPERATIVE DIAGNOSIS:  1. Right grade 3A open left femur 2. Right grade 2 open left tibia 3. Right open grade 3A left calcaneus 4. Right open grade 3A subtalar and talonavicular dislocation 5. Right leg spanning external fixator 6. Right quadriceps tendon tear  POST-OPERATIVE DIAGNOSIS:  1. Right grade 3A open left femur 2. Right grade 2 open left tibia 3. Right open grade 3A left calcaneus 4. Right open grade 3A subtalar and talonavicular dislocation 5. Right leg spanning external fixator 6. Right quadriceps tendon tear  PROCEDUREs:  1. EXTERNAL FIXATION removal right leg 2. RIGHT OPEN FEMUR I&D INCLUDING BONE 3. RIGHT OPEN TIBIA I&D INCLUDING BONE 4. RIGHT OPEN CALCANEUS I&D INCLUDING BONE 5. APPLICATION OF WOUND VAC RIGHT HEEL, SMALL 6. ORIF RIGHT INTERCONDYLAR DISTAL FEMUR FRACTURE 7. IMN OF RIGHT TIBIA, BIOMET X 315 MM 8. ORIF OF RIGHT CALCANEUS 9. PLACEMENT OF ANTIBIOTIC SPACER RIGHT CALCANEUS 10. REPAIR OF QUADRICEP TENDON RIGHT KNEE 11. OPEN TREATMENT OF SUBTALAR DISLOCATION WITH FIXATION 12. STRESS FLOURO RIGHT KNEE AND ANKLE 13. CLOSED TREATMENT OF RIGHT FIBULAR HEAD AND SHAFT FRACTURES  SURGEON:  Surgeon(s) and Role:    * Myrene Galas, MD - Primary  PHYSICIAN ASSISTANT: Montez Morita, PA-C  ANESTHESIA:   general  I/O:  Total I/O In: 2250 [I.V.:2000; IV Piggyback:250] Out: 2130 [QMVHQ:4696; Blood:200]  SPECIMEN:  Source of Specimen:  Deep tissue right open distal femur fracture site  DISPOSITION OF SPECIMEN:  To micro  TOURNIQUET:  * No tourniquets in log *  DICTATION: .Other Dictation: Dictation Number [id number]

## 2014-08-29 NOTE — Progress Notes (Signed)
Pressure reg going off. Sx with quite a bit of thick return, adjusted alarm

## 2014-08-29 NOTE — Anesthesia Preprocedure Evaluation (Addendum)
Anesthesia Evaluation  Patient identified by MRN, date of birth, ID band Patient unresponsive  General Assessment Comment:Pt sedated and orally intubated on mech vent.  BP very reactive to stimulation.  ID verified with RN in ICU.  Reviewed: Allergy & Precautions, NPO status , Patient's Chart, lab work & pertinent test results  Airway Mallampati: Intubated       Dental  (+) Teeth Intact   Pulmonary Current Smoker,  breath sounds clear to auscultation        Cardiovascular hypertension, Pt. on medications + Past MI Rhythm:Regular Rate:Tachycardia     Neuro/Psych cervical collar in place.    GI/Hepatic   Endo/Other    Renal/GU      Musculoskeletal   Abdominal   Peds  Hematology   Anesthesia Other Findings Dr. Lindie Spruce found pt to have significant ETT cuff leak just before 8 a.m. And ordered CXR which showed cuff above cords.  ETT advanced 3 cm/  Reproductive/Obstetrics                            Anesthesia Physical  Anesthesia Plan  ASA: IV  Anesthesia Plan: General   Post-op Pain Management:    Induction: Intravenous  Airway Management Planned: Oral ETT  Additional Equipment:   Intra-op Plan:   Post-operative Plan: Post-operative intubation/ventilation  Informed Consent: I have reviewed the patients History and Physical, chart, labs and discussed the procedure including the risks, benefits and alternatives for the proposed anesthesia with the patient or authorized representative who has indicated his/her understanding and acceptance.     Plan Discussed with: CRNA and Anesthesiologist  Anesthesia Plan Comments:         Anesthesia Quick Evaluation

## 2014-08-29 NOTE — Anesthesia Postprocedure Evaluation (Signed)
  Anesthesia Post-op Note  Patient: [name]  Procedure(s) Performed: Procedure(s): REMOVAL EXTERNAL FIXATION LEG (Right) OPEN REDUCTION INTERNAL FIXATION (ORIF) DISTAL FEMUR FRACTURE (Right) INTRAMEDULLARY (IM) NAIL TIBIAL (Right) IRRIGATION AND DEBRIDEMENT RIGHT FOOT (Right) REPAIR QUADRICEP TENDON (Right)  Patient Location: ICU  Anesthesia Type:General  Level of Consciousness: sedated and unresponsive  Airway and Oxygen Therapy: Patient remains intubated per anesthesia plan and Patient placed on Ventilator (see vital sign flow sheet for setting)  Post-op Pain: BP does go up with stimulation, but hopefully no pain.  Unable to fully assess  Post-op Assessment: Post-op Vital signs reviewed, Patient's Cardiovascular Status Stable, Respiratory Function Stable, Patent Airway and No signs of Nausea or vomiting LLE Motor Response: Other (Comment) (withdraws to pain)   RLE Motor Response: Other (Comment) (withdraws to pain)        Post-op Vital Signs: Reviewed and stable  Last Vitals:  Filed Vitals:   08/29/14 1612  BP: 154/105  Pulse: 115  Temp:   Resp: 19    Complications: No apparent anesthesia complications

## 2014-08-29 NOTE — Progress Notes (Signed)
Intubated pt transported to OR but not on vent. PT tolerated well.
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
## 2014-08-29 NOTE — Anesthesia Postprocedure Evaluation (Signed)
  Anesthesia Post-op Note  Patient: [name]  Procedure(s) Performed: Procedure(s): REMOVAL EXTERNAL FIXATION LEG (Right) OPEN REDUCTION INTERNAL FIXATION (ORIF) DISTAL FEMUR FRACTURE (Right) INTRAMEDULLARY (IM) NAIL TIBIAL (Right) IRRIGATION AND DEBRIDEMENT RIGHT FOOT (Right) REPAIR QUADRICEP TENDON (Right)  Patient Location: SICU  Anesthesia Type:General  Level of Consciousness: sedated and Patient remains intubated per anesthesia plan  Airway and Oxygen Therapy: Patient remains intubated per anesthesia plan and Patient placed on Ventilator (see vital sign flow sheet for setting)  Post-op Pain: none  Post-op Assessment: Post-op Vital signs reviewed, Patient's Cardiovascular Status Stable, Respiratory Function Stable, Patent Airway, No signs of Nausea or vomiting and Pain level controlled LLE Motor Response: Other (Comment) (withdraws to pain)   RLE Motor Response: Other (Comment) (withdraws to pain)        Post-op Vital Signs: stable  Last Vitals:  Filed Vitals:   08/29/14 0810  BP: 130/66  Pulse: 115  Temp:   Resp: 14    Complications: No apparent anesthesia complications

## 2014-08-29 NOTE — Progress Notes (Signed)
Follow up - Trauma and Critical Care  Patient Details:    [name] is an 49 y.o. female.  Lines/tubes : Airway 7 mm (Active)  Secured at (cm) 21 cm 08/29/2014  8:10 AM  Measured From Lips 08/29/2014  8:10 AM  Secured Location Right 08/29/2014  8:10 AM  Secured By [employer] 08/29/2014  8:10 AM  Tube Holder Repositioned Yes 08/29/2014  8:10 AM  Cuff Pressure (cm H2O) 24 cm H2O 08/29/2014  8:10 AM  Site Condition Dry 08/28/2014  3:30 PM     CVC Double Lumen 08/27/14 Left Subclavian 17 cm (Active)  Indication for Insertion or Continuance of Line Prolonged intravenous therapies 08/29/2014  7:40 AM  Site Assessment Clean;Dry;Intact 08/29/2014  7:40 AM  Proximal Lumen Status Infusing 08/29/2014  7:40 AM  Distal Lumen Status Infusing 08/29/2014  7:40 AM  Dressing Type Transparent 08/29/2014  7:40 AM  Dressing Status Dry;Clean;Intact 08/29/2014  7:40 AM  Line Care Connections checked and tightened 08/29/2014  7:40 AM  Dressing Change Due 09/03/14 08/29/2014  7:40 AM     Arterial Line 08/24/14 Left Brachial (Active)  Site Assessment Clean;Dry;Intact 08/29/2014  7:40 AM  Line Status Pulsatile blood flow 08/29/2014  7:40 AM  Art Line Waveform Appropriate 08/29/2014  7:40 AM  Art Line Interventions Zeroed and calibrated;Connections checked and tightened 08/29/2014  7:40 AM  Color/Movement/Sensation Capillary refill less than 3 sec 08/29/2014  7:40 AM  Dressing Type Transparent 08/29/2014  7:40 AM  Dressing Status Clean;Dry;Intact 08/29/2014  7:40 AM     Closed System Drain 1 Right Knee 10 Fr. (Active)  Site Description Unable to view 08/29/2014  7:40 AM  Dressing Status Clean;Dry;Intact 08/29/2014  7:40 AM  Drainage Appearance Bloody 08/29/2014  7:40 AM  Status To suction (Charged) 08/29/2014  7:40 AM  Output (mL) 30 mL 08/29/2014  6:00 AM     Negative Pressure Wound Therapy Foot Right (Active)  Site / Wound Assessment Dressing in place / Unable to assess 08/29/2014  7:40 AM  Peri-wound  Assessment Intact 08/29/2014  7:40 AM  Cycle Continuous 08/29/2014  7:40 AM  Target Pressure (mmHg) 125 08/29/2014  7:40 AM  Canister Changed No 08/29/2014  7:40 AM  Dressing Status Intact 08/29/2014  7:40 AM  Drainage Amount Minimal 08/29/2014  7:40 AM  Drainage Description Sanguineous 08/29/2014  7:40 AM  Output (mL) 50 mL 08/29/2014  6:00 AM     NG/OG Tube Orogastric 16 Fr. Right mouth (Active)  Placement Verification Auscultation 08/29/2014  7:40 AM  Site Assessment Clean;Dry;Intact 08/29/2014  7:40 AM  Status Clamped 08/29/2014  7:40 AM  Drainage Appearance None 08/29/2014  7:40 AM  Gastric Residual 0 mL 08/29/2014  7:40 AM  Output (mL) 150 mL 08/28/2014  6:00 AM     Urethral Catheter A. Collins RN Latex;Straight-tip 16 Fr. (Active)  Indication for Insertion or Continuance of Catheter Unstable critical patients (first 24-48 hours);Unstable spinal/crush injuries;Peri-operative use for selective surgical procedure 08/29/2014  7:40 AM  Site Assessment Clean;Intact 08/29/2014  7:40 AM  Catheter Maintenance Bag below level of bladder;Catheter secured;Drainage bag/tubing not touching floor;No dependent loops;Seal intact 08/29/2014  7:40 AM  Collection Container Standard drainage bag 08/29/2014  7:40 AM  Securement Method Securing device (Describe) 08/29/2014  7:40 AM  Urinary Catheter Interventions Unclamped 08/29/2014  7:40 AM  Output (mL) 125 mL 08/29/2014  7:42 AM    Microbiology/Sepsis markers: Results for orders placed or performed during the hospital encounter of 08/24/14  MRSA PCR Screening  Status: None   Collection Time: 08/24/14  7:15 PM  Result Value Ref Range Status   MRSA by PCR NEGATIVE NEGATIVE Final    Comment:        The GeneXpert MRSA Assay (FDA approved for NASAL specimens only), is one component of a comprehensive MRSA colonization surveillance program. It is not intended to diagnose MRSA infection nor to guide or monitor treatment for MRSA infections.      Anti-infectives:  Anti-infectives    Start     Dose/Rate Route Frequency Ordered Stop   08/26/14 1400  [MAR Hold]  ceFAZolin (ANCEF) IVPB 2 g/50 mL premix     (MAR Hold since 08/29/14 0818)  Comments:  Will adjust duration after wounds assessed in OR tomorrow   2 g 100 mL/hr over 30 Minutes Intravenous 3 times per day 08/26/14 1147     08/24/14 1615  ceFAZolin (ANCEF) IVPB 2 g/50 mL premix  Status:  Discontinued     2 g 100 mL/hr over 30 Minutes Intravenous  Once 08/24/14 1602 08/26/14 1206      Best Practice/Protocols:  VTE Prophylaxis: Mechanical GI Prophylaxis: Proton Pump Inhibitor Continous Sedation  Consults: Treatment Team:  Myrene Galas, MD    Events:  Subjective:    Overnight Issues: Patient taken back to the OR today for ORIF of leg.  I was able to see the patient just prior to being taken away.  She would awaken on the ventilator, but would not follow commands.  Tube feedings held.    Objective:  Vital signs for last 24 hours: Temp:  [99 F (37.2 C)-101.1 F (38.4 C)] 99 F (37.2 C) (07/21 0400) Pulse Rate:  [103-138] 115 (07/21 0810) Resp:  [10-27] 14 (07/21 0810) BP: (109-178)/(44-112) 130/66 mmHg (07/21 0810) SpO2:  [98 %-100 %] 99 % (07/21 0810) Arterial Line BP: (103-258)/(56-126) 131/66 mmHg (07/21 0700) FiO2 (%):  [30 %] 30 % (07/21 0810) Weight:  [95.7 kg (210 lb 15.7 oz)] 95.7 kg (210 lb 15.7 oz) (07/21 0430)  Hemodynamic parameters for last 24 hours:    Intake/Output from previous day: 07/20 0701 - 07/21 0700 In: 4105.5 [I.V.:2890.5; Blood:665; NG/GT:400; IV Piggyback:150] Out: 1511 [Urine:1391; Drains:120]  Intake/Output this shift: Total I/O In: -  Out: 125 [Urine:125]  Vent settings for last 24 hours: Vent Mode:  [-] PRVC FiO2 (%):  [30 %] 30 % Set Rate:  [12 bmp] 12 bmp Vt Set:  [550 mL] 550 mL PEEP:  [5 cmH20] 5 cmH20 Pressure Support:  [5 cmH20] 5 cmH20 Plateau Pressure:  [20 cmH20-23 cmH20] 22 cmH20  Physical Exam:   General: no respiratory distress Neuro: nonfocal exam and RASS 0 Resp: clear to auscultation bilaterally CVS: Sinus tachycardia GI: soft, nontender, BS WNL, no r/g and Had been tolerating tube feedings, but not up to goal Extremities: Orthopedic injuries as noted.  Results for orders placed or performed during the hospital encounter of 08/24/14 (from the past 24 hour(s))  Prepare RBC     Status: None   Collection Time: 08/28/14 11:26 AM  Result Value Ref Range   Order Confirmation ORDER PROCESSED BY BLOOD BANK   Type and screen     Status: None (Preliminary result)   Collection Time: 08/28/14 12:30 PM  Result Value Ref Range   ABO/RH(D) A POS    Antibody Screen NEG    Sample Expiration 08/31/2014    Unit Number [id number]    Blood Component Type RED CELLS,LR    Unit division 00  Status of Unit ISSUED    Transfusion Status OK TO TRANSFUSE    Crossmatch Result Compatible    Unit Number [id number]    Blood Component Type RED CELLS,LR    Unit division 00    Status of Unit ISSUED    Transfusion Status OK TO TRANSFUSE    Crossmatch Result Compatible   Glucose, capillary     Status: None   Collection Time: 08/28/14  1:18 PM  Result Value Ref Range   Glucose-Capillary 80 65 - 99 mg/dL   Comment 1 Notify RN    Comment 2 Document in Chart   Glucose, capillary     Status: Abnormal   Collection Time: 08/28/14  4:11 PM  Result Value Ref Range   Glucose-Capillary 103 (H) 65 - 99 mg/dL   Comment 1 Notify RN    Comment 2 Document in Chart   Glucose, capillary     Status: None   Collection Time: 08/28/14  8:05 PM  Result Value Ref Range   Glucose-Capillary 87 65 - 99 mg/dL  Glucose, capillary     Status: Abnormal   Collection Time: 08/28/14 11:48 PM  Result Value Ref Range   Glucose-Capillary 115 (H) 65 - 99 mg/dL  CBC     Status: Abnormal   Collection Time: 08/29/14 12:05 AM  Result Value Ref Range   WBC 15.3 (H) 4.0 - 10.5 K/uL   RBC 3.63 (L) 3.87 - 5.11 MIL/uL    Hemoglobin 10.8 (L) 12.0 - 15.0 g/dL   HCT 72.5 (L) 36.6 - 44.0 %   MCV 87.6 78.0 - 100.0 fL   MCH 29.8 26.0 - 34.0 pg   MCHC 34.0 30.0 - 36.0 g/dL   RDW 34.7 (H) 42.5 - 95.6 %   Platelets 132 (L) 150 - 400 K/uL  Glucose, capillary     Status: Abnormal   Collection Time: 08/29/14  3:51 AM  Result Value Ref Range   Glucose-Capillary 146 (H) 65 - 99 mg/dL  Basic metabolic panel     Status: Abnormal   Collection Time: 08/29/14  3:59 AM  Result Value Ref Range   Sodium 144 135 - 145 mmol/L   Potassium 3.2 (L) 3.5 - 5.1 mmol/L   Chloride 113 (H) 101 - 111 mmol/L   CO2 26 22 - 32 mmol/L   Glucose, Bld 162 (H) 65 - 99 mg/dL   BUN 6 6 - 20 mg/dL   Creatinine, Ser 3.87 0.44 - 1.00 mg/dL   Calcium 7.5 (L) 8.9 - 10.3 mg/dL   GFR calc non Af Amer >60 >60 mL/min   GFR calc Af Amer >60 >60 mL/min   Anion gap 5 5 - 15     Assessment/Plan:   NEURO  Altered Mental Status:  agitation, delirium and sedation   Plan: Will work on establishing what will be the patient's baseline after surgery when we can consider weaning towards extubation.  PULM  Atelectasis/collapse (focal)   Plan: Still nothing that should make extubation difficult at the appropriate time.  CARDIO  Sinus Tachycardia   Plan: No other problems.  RENAL  Urine output is good.   Plan: CPM  GI  No significant problems.     Plan: CPM  ID  No known infectious problems   Plan: CPM  HEME  Anemia acute blood loss anemia)   Plan: Mild and does not currently require transfusion  ENDO No known issues   Plan: CPM  Global Issues  Patient gone to the OR  for ORIF.  Will work on extubation after patient no longer requires surgery.    LOS: 5 days   Additional comments:I reviewed the patient's new clinical lab test results. cbc/bmet and I reviewed the patients new imaging test results. cxr  Critical Care Total Time*: 30 Minutes  Jamicia Haaland 08/29/2014  *Care during the described time interval was provided by me and/or other  providers on the critical care team.  I have reviewed this patient's available data, including medical history, events of note, physical examination and test results as part of my evaluation.

## 2014-08-29 NOTE — Progress Notes (Signed)
Pt not weaned due to heading to OR

## 2014-08-30 ENCOUNTER — Inpatient Hospital Stay (HOSPITAL_COMMUNITY): Payer: Medicaid Other

## 2014-08-30 ENCOUNTER — Encounter (HOSPITAL_COMMUNITY): Payer: Self-pay | Admitting: Orthopedic Surgery

## 2014-08-30 DIAGNOSIS — I10 Essential (primary) hypertension: Secondary | ICD-10-CM

## 2014-08-30 DIAGNOSIS — R509 Fever, unspecified: Secondary | ICD-10-CM

## 2014-08-30 DIAGNOSIS — B9689 Other specified bacterial agents as the cause of diseases classified elsewhere: Secondary | ICD-10-CM

## 2014-08-30 DIAGNOSIS — R918 Other nonspecific abnormal finding of lung field: Secondary | ICD-10-CM

## 2014-08-30 DIAGNOSIS — S81801D Unspecified open wound, right lower leg, subsequent encounter: Secondary | ICD-10-CM

## 2014-08-30 LAB — POCT I-STAT 4, (NA,K, GLUC, HGB,HCT)
Glucose, Bld: 110 mg/dL — ABNORMAL HIGH (ref 65–99)
Glucose, Bld: 113 mg/dL — ABNORMAL HIGH (ref 65–99)
HCT: 26 % — ABNORMAL LOW (ref 36.0–46.0)
HCT: 26 % — ABNORMAL LOW (ref 36.0–46.0)
Hemoglobin: 8.8 g/dL — ABNORMAL LOW (ref 12.0–15.0)
Hemoglobin: 8.8 g/dL — ABNORMAL LOW (ref 12.0–15.0)
Potassium: 3.6 mmol/L (ref 3.5–5.1)
Potassium: 3.4 mmol/L — ABNORMAL LOW (ref 3.5–5.1)
Sodium: 143 mmol/L (ref 135–145)
Sodium: 143 mmol/L (ref 135–145)

## 2014-08-30 LAB — CBC WITH DIFFERENTIAL/PLATELET
Basophils Absolute: 0 10*3/uL (ref 0.0–0.1)
Basophils Relative: 0 % (ref 0–1)
Eosinophils Absolute: 0.1 10*3/uL (ref 0.0–0.7)
Eosinophils Relative: 1 % (ref 0–5)
HCT: 28.3 % — ABNORMAL LOW (ref 36.0–46.0)
Hemoglobin: 9.5 g/dL — ABNORMAL LOW (ref 12.0–15.0)
Lymphocytes Relative: 6 % — ABNORMAL LOW (ref 12–46)
Lymphs Abs: 0.7 10*3/uL (ref 0.7–4.0)
MCH: 29.2 pg (ref 26.0–34.0)
MCHC: 33.6 g/dL (ref 30.0–36.0)
MCV: 87.1 fL (ref 78.0–100.0)
Monocytes Absolute: 1.9 10*3/uL — ABNORMAL HIGH (ref 0.1–1.0)
Monocytes Relative: 15 % — ABNORMAL HIGH (ref 3–12)
Neutro Abs: 10 10*3/uL — ABNORMAL HIGH (ref 1.7–7.7)
Neutrophils Relative %: 78 % — ABNORMAL HIGH (ref 43–77)
Platelets: 145 10*3/uL — ABNORMAL LOW (ref 150–400)
RBC: 3.25 MIL/uL — ABNORMAL LOW (ref 3.87–5.11)
RDW: 16.2 % — ABNORMAL HIGH (ref 11.5–15.5)
WBC: 12.8 10*3/uL — ABNORMAL HIGH (ref 4.0–10.5)

## 2014-08-30 LAB — GENTAMICIN LEVEL, TROUGH: Gentamicin Trough: 3.5 ug/mL (ref 0.5–2.0)

## 2014-08-30 LAB — BASIC METABOLIC PANEL
Anion gap: 6 (ref 5–15)
BUN: 6 mg/dL (ref 6–20)
CO2: 28 mmol/L (ref 22–32)
Calcium: 7.7 mg/dL — ABNORMAL LOW (ref 8.9–10.3)
Chloride: 109 mmol/L (ref 101–111)
Creatinine, Ser: 0.69 mg/dL (ref 0.44–1.00)
GFR calc Af Amer: >60 mL/min (ref >60)
GFR calc non Af Amer: >60 mL/min (ref >60)
Glucose, Bld: 144 mg/dL — ABNORMAL HIGH (ref 65–99)
Potassium: 3.6 mmol/L (ref 3.5–5.1)
Sodium: 143 mmol/L (ref 135–145)

## 2014-08-30 LAB — CBC
HCT: 28.9 % — ABNORMAL LOW (ref 36.0–46.0)
Hemoglobin: 9.8 g/dL — ABNORMAL LOW (ref 12.0–15.0)
MCH: 29.3 pg (ref 26.0–34.0)
MCHC: 33.9 g/dL (ref 30.0–36.0)
MCV: 86.5 fL (ref 78.0–100.0)
Platelets: 149 10*3/uL — ABNORMAL LOW (ref 150–400)
RBC: 3.34 MIL/uL — ABNORMAL LOW (ref 3.87–5.11)
RDW: 16.2 % — ABNORMAL HIGH (ref 11.5–15.5)
WBC: 12.1 10*3/uL — ABNORMAL HIGH (ref 4.0–10.5)

## 2014-08-30 LAB — GLUCOSE, CAPILLARY
Glucose-Capillary: 137 mg/dL — ABNORMAL HIGH (ref 65–99)
Glucose-Capillary: 137 mg/dL — ABNORMAL HIGH (ref 65–99)
Glucose-Capillary: 153 mg/dL — ABNORMAL HIGH (ref 65–99)
Glucose-Capillary: 139 mg/dL — ABNORMAL HIGH (ref 65–99)
Glucose-Capillary: 163 mg/dL — ABNORMAL HIGH (ref 65–99)
Glucose-Capillary: 146 mg/dL — ABNORMAL HIGH (ref 65–99)

## 2014-08-30 LAB — TRIGLYCERIDES: Triglycerides: 292 mg/dL — ABNORMAL HIGH (ref <150)

## 2014-08-30 MED ORDER — KCL IN DEXTROSE-NACL 20-5-0.45 MEQ/L-%-% IV SOLN
INTRAVENOUS | Status: DC
  Administered 2014-08-30 – 2014-08-31 (×3): via INTRAVENOUS
  Administered 2014-09-01: 10 mL/h via INTRAVENOUS
  Administered 2014-09-05 – 2014-09-09 (×8): via INTRAVENOUS
  Administered 2014-09-11: 75 mL/h via INTRAVENOUS
  Administered 2014-09-12 – 2014-09-16 (×3): via INTRAVENOUS
  Filled 2014-08-30 (×36): qty 1000
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
## 2014-08-30 NOTE — Progress Notes (Signed)
Follow up - Trauma and Critical Care  Patient Details:    [name] is an 49 y.o. female.  Lines/tubes : Airway 7 mm (Active)  Secured at (cm) 21 cm 08/30/2014  3:05 AM  Measured From Lips 08/30/2014  3:05 AM  Secured Location Left 08/30/2014  3:05 AM  Secured By [employer] 08/30/2014  3:05 AM  Tube Holder Repositioned Yes 08/30/2014  3:05 AM  Cuff Pressure (cm H2O) 25 cm H2O 08/29/2014  7:18 PM  Site Condition Dry 08/28/2014  3:30 PM     CVC Double Lumen 08/27/14 Left Subclavian 17 cm (Active)  Indication for Insertion or Continuance of Line Prolonged intravenous therapies 08/29/2014  8:00 PM  Site Assessment Clean;Dry;Intact 08/29/2014  8:00 PM  Proximal Lumen Status Infusing 08/29/2014  8:00 PM  Distal Lumen Status Infusing 08/29/2014  8:00 PM  Dressing Type Transparent 08/29/2014  8:00 PM  Dressing Status Dry;Clean;Intact 08/29/2014  8:00 PM  Line Care Connections checked and tightened 08/29/2014  8:00 PM  Dressing Change Due 09/03/14 08/29/2014  8:00 PM     Arterial Line 08/24/14 Left Brachial (Active)  Site Assessment Clean;Dry;Intact 08/29/2014  8:00 PM  Line Status Pulsatile blood flow 08/29/2014  8:00 PM  Art Line Waveform Appropriate 08/29/2014  8:00 PM  Art Line Interventions Zeroed and calibrated;Flushed per protocol;Connections checked and tightened 08/29/2014  8:00 PM  Color/Movement/Sensation Capillary refill less than 3 sec 08/29/2014  8:00 PM  Dressing Type Transparent 08/29/2014  8:00 PM  Dressing Status Clean;Dry;Intact 08/29/2014  8:00 PM     Closed System Drain 1 Right Knee 10 Fr. (Active)  Site Description Unable to view 08/29/2014  7:40 AM  Dressing Status Clean;Dry;Intact 08/29/2014  7:40 AM  Drainage Appearance Bloody 08/29/2014  7:40 AM  Status To suction (Charged) 08/29/2014  7:40 AM  Output (mL) 30 mL 08/29/2014  6:00 AM     Negative Pressure Wound Therapy Foot Right;Medial (Active)  Dressing Status Intact 08/29/2014  8:00 PM  Drainage Amount None 08/29/2014   8:00 PM     NG/OG Tube Orogastric 16 Fr. Right mouth (Active)  Placement Verification Auscultation 08/29/2014  8:00 PM  Site Assessment Clean;Dry;Intact 08/29/2014  8:00 PM  Status Infusing tube feed 08/29/2014  8:00 PM  Drainage Appearance None 08/29/2014  7:40 AM  Gastric Residual 0 mL 08/29/2014  8:00 PM  Output (mL) 150 mL 08/28/2014  6:00 AM     Urethral Catheter A. Collins RN Latex;Straight-tip 16 Fr. (Active)  Indication for Insertion or Continuance of Catheter Unstable critical patients (first 24-48 hours);Unstable spinal/crush injuries;Peri-operative use for selective surgical procedure 08/29/2014  8:00 PM  Site Assessment Clean;Intact 08/29/2014  8:00 PM  Catheter Maintenance Bag below level of bladder;Catheter secured;Drainage bag/tubing not touching floor;No dependent loops;Seal intact 08/29/2014  8:00 PM  Collection Container Standard drainage bag 08/29/2014  8:00 PM  Securement Method Securing device (Describe) 08/29/2014  8:00 PM  Urinary Catheter Interventions Unclamped 08/29/2014  8:00 PM  Output (mL) 100 mL 08/30/2014  5:00 AM    Microbiology/Sepsis markers: Results for orders placed or performed during the hospital encounter of 08/24/14  MRSA PCR Screening     Status: None   Collection Time: 08/24/14  7:15 PM  Result Value Ref Range Status   MRSA by PCR NEGATIVE NEGATIVE Final    Comment:        The GeneXpert MRSA Assay (FDA approved for NASAL specimens only), is one component of a comprehensive MRSA colonization surveillance program. It is not intended to  diagnose MRSA infection nor to guide or monitor treatment for MRSA infections.   Gram stain     Status: None   Collection Time: 08/29/14  9:22 AM  Result Value Ref Range Status   Specimen Description ABSCESS LEG RIGHT  Final   Special Requests NONE  Final   Gram Stain   Final    MODERATE WBC PRESENT,BOTH PMN AND MONONUCLEAR RARE GRAM VARIABLE ROD GRAM STAIN REVIEWED-AGREE WITH RESULT S YARBROUGH Gram Stain  Report Called to,Read Back By and Verified With: J DIXON,RN AT 1037 08/29/14 BY K BARR    Report Status 08/29/2014 FINAL  Final    Anti-infectives:  Anti-infectives    Start     Dose/Rate Route Frequency Ordered Stop   08/29/14 2200  ceFAZolin (ANCEF) IVPB 2 g/50 mL premix    Comments:  Will adjust duration after wounds assessed in OR tomorrow   2 g 100 mL/hr over 30 Minutes Intravenous 3 times per day 08/29/14 1616 09/01/14 2159   08/29/14 1800  gentamicin (GARAMYCIN) 500 mg in dextrose 5 % 100 mL IVPB     500 mg 112.5 mL/hr over 60 Minutes Intravenous Every 24 hours 08/29/14 1700 09/01/14 1759   08/29/14 1430  gentamicin (GARAMYCIN) IVPB 80 mg     80 mg 100 mL/hr over 30 Minutes Intravenous To Surgery 08/29/14 1420 08/29/14 1500   08/29/14 1344  tobramycin (NEBCIN) powder  Status:  Discontinued       As needed 08/29/14 1344 08/29/14 1556   08/29/14 1343  vancomycin (VANCOCIN) powder  Status:  Discontinued       As needed 08/29/14 1343 08/29/14 1556   08/26/14 1400  ceFAZolin (ANCEF) IVPB 2 g/50 mL premix  Status:  Discontinued    Comments:  Will adjust duration after wounds assessed in OR tomorrow   2 g 100 mL/hr over 30 Minutes Intravenous 3 times per day 08/26/14 1147 08/29/14 1616   08/24/14 1615  ceFAZolin (ANCEF) IVPB 2 g/50 mL premix  Status:  Discontinued     2 g 100 mL/hr over 30 Minutes Intravenous  Once 08/24/14 1602 08/26/14 1206      Best Practice/Protocols:  VTE Prophylaxis: Mechanical GI Prophylaxis: Proton Pump Inhibitor Continous Sedation  Consults: Treatment Team:  Myrene Galas, MD    Events:  Subjective:    Overnight Issues: Patient is on Versed and fentanyl drips.  Opens eyes but does not follow commands.  Last orthopedic procedure was yesterday.  Objective:  Vital signs for last 24 hours: Temp:  [98.6 F (37 C)-103 F (39.4 C)] 101 F (38.3 C) (07/22 0400) Pulse Rate:  [108-143] 121 (07/22 0500) Resp:  [12-25] 13 (07/22 0500) BP:  (109-178)/(42-105) 119/46 mmHg (07/22 0500) SpO2:  [98 %-100 %] 100 % (07/22 0500) Arterial Line BP: (103-208)/(45-92) 122/54 mmHg (07/22 0500) FiO2 (%):  [30 %] 30 % (07/22 0500) Weight:  [96.7 kg (213 lb 3 oz)] 96.7 kg (213 lb 3 oz) (07/22 0500)  Hemodynamic parameters for last 24 hours:    Intake/Output from previous day: 07/21 0701 - 07/22 0700 In: 5300.5 [I.V.:4516.6; NG/GT:371.3; IV Piggyback:412.5] Out: 3040 [Urine:2840; Blood:200]  Intake/Output this shift: Total I/O In: 1370 [I.V.:1000; NG/GT:320; IV Piggyback:50] Out: 850 [Urine:850]  Vent settings for last 24 hours: Vent Mode:  [-] PRVC FiO2 (%):  [30 %] 30 % Set Rate:  [12 bmp] 12 bmp Vt Set:  [550 mL] 550 mL Pressure Support:  [5 cmH20] 5 cmH20 Plateau Pressure:  [20 cmH20-28 cmH20] 20 cmH20  Physical Exam:  General: no respiratory distress Neuro: nonfocal exam and RASS -1 Resp: clear to auscultation bilaterally GI: soft, nontender, BS WNL, no r/g and have not started tube feedings yet. Extremities: Both lower extremities are wrapped to the ankles Plexi-Pulse boot on left foot only]  Results for orders placed or performed during the hospital encounter of 08/24/14 (from the past 24 hour(s))  Gram stain     Status: None   Collection Time: 08/29/14  9:22 AM  Result Value Ref Range   Specimen Description ABSCESS LEG RIGHT    Special Requests NONE    Gram Stain      MODERATE WBC PRESENT,BOTH PMN AND MONONUCLEAR RARE GRAM VARIABLE ROD GRAM STAIN REVIEWED-AGREE WITH RESULT S YARBROUGH Gram Stain Report Called to,Read Back By and Verified With: J DIXON,RN AT 1037 08/29/14 BY K BARR    Report Status 08/29/2014 FINAL   Basic metabolic panel     Status: Abnormal   Collection Time: 08/29/14  5:00 PM  Result Value Ref Range   Sodium 141 135 - 145 mmol/L   Potassium 3.7 3.5 - 5.1 mmol/L   Chloride 108 101 - 111 mmol/L   CO2 27 22 - 32 mmol/L   Glucose, Bld 112 (H) 65 - 99 mg/dL   BUN <5 (L) 6 - 20 mg/dL    Creatinine, Ser 6.96 0.44 - 1.00 mg/dL   Calcium 7.7 (L) 8.9 - 10.3 mg/dL   GFR calc non Af Amer >60 >60 mL/min   GFR calc Af Amer >60 >60 mL/min   Anion gap 6 5 - 15  Glucose, capillary     Status: Abnormal   Collection Time: 08/29/14  7:43 PM  Result Value Ref Range   Glucose-Capillary 118 (H) 65 - 99 mg/dL  Glucose, capillary     Status: Abnormal   Collection Time: 08/29/14 11:20 PM  Result Value Ref Range   Glucose-Capillary 114 (H) 65 - 99 mg/dL  CBC     Status: Abnormal   Collection Time: 08/29/14 11:30 PM  Result Value Ref Range   WBC 12.1 (H) 4.0 - 10.5 K/uL   RBC 3.34 (L) 3.87 - 5.11 MIL/uL   Hemoglobin 9.8 (L) 12.0 - 15.0 g/dL   HCT 29.5 (L) 28.4 - 13.2 %   MCV 86.5 78.0 - 100.0 fL   MCH 29.3 26.0 - 34.0 pg   MCHC 33.9 30.0 - 36.0 g/dL   RDW 44.0 (H) 10.2 - 72.5 %   Platelets 149 (L) 150 - 400 K/uL  Basic metabolic panel     Status: Abnormal   Collection Time: 08/30/14  3:50 AM  Result Value Ref Range   Sodium 143 135 - 145 mmol/L   Potassium 3.6 3.5 - 5.1 mmol/L   Chloride 109 101 - 111 mmol/L   CO2 28 22 - 32 mmol/L   Glucose, Bld 144 (H) 65 - 99 mg/dL   BUN 6 6 - 20 mg/dL   Creatinine, Ser 3.66 0.44 - 1.00 mg/dL   Calcium 7.7 (L) 8.9 - 10.3 mg/dL   GFR calc non Af Amer >60 >60 mL/min   GFR calc Af Amer >60 >60 mL/min   Anion gap 6 5 - 15  CBC with Differential/Platelet     Status: Abnormal   Collection Time: 08/30/14  3:50 AM  Result Value Ref Range   WBC 12.8 (H) 4.0 - 10.5 K/uL   RBC 3.25 (L) 3.87 - 5.11 MIL/uL   Hemoglobin 9.5 (L) 12.0 - 15.0 g/dL  HCT 28.3 (L) 36.0 - 46.0 %   MCV 87.1 78.0 - 100.0 fL   MCH 29.2 26.0 - 34.0 pg   MCHC 33.6 30.0 - 36.0 g/dL   RDW 29.5 (H) 62.1 - 30.8 %   Platelets 145 (L) 150 - 400 K/uL   Neutrophils Relative % 78 (H) 43 - 77 %   Neutro Abs 10.0 (H) 1.7 - 7.7 K/uL   Lymphocytes Relative 6 (L) 12 - 46 %   Lymphs Abs 0.7 0.7 - 4.0 K/uL   Monocytes Relative 15 (H) 3 - 12 %   Monocytes Absolute 1.9 (H) 0.1 - 1.0 K/uL    Eosinophils Relative 1 0 - 5 %   Eosinophils Absolute 0.1 0.0 - 0.7 K/uL   Basophils Relative 0 0 - 1 %   Basophils Absolute 0.0 0.0 - 0.1 K/uL  Glucose, capillary     Status: Abnormal   Collection Time: 08/30/14  3:59 AM  Result Value Ref Range   Glucose-Capillary 137 (H) 65 - 99 mg/dL     Assessment/Plan:   NEURO  Altered Mental Status:  sedation   Plan: Will wean sedation as we try to move towards extubation.  PULM  No known pulmonary issues   Plan: Will try to move toward extubation today.  CARDIO  Sinus Tachycardia   Plan: CPM  RENAL  Urine output is good and renal function is good.   Plan: CPM  GI  No specific issues   Plan: CPM.  Will start tube feedings   ID  No known infectious problems   Plan: CPM  HEME  Anemia acute blood loss anemia and anemia of critical illness)   Plan: No blood needed for now.  ENDO No specific issues   Plan: CPM  Global Issues  Patient doing well postoperatively. Will try to move towards extubation this weekend.  Anemia controlled.      LOS: 6 days   Additional comments:I reviewed the patient's new clinical lab test results. cbc/bmet and I reviewed the patients new imaging test results. cxr  Critical Care Total Time*: 30 Minutes  Tye Vigo 08/30/2014  *Care during the described time interval was provided by me and/or other providers on the critical care team.  I have reviewed this patient's available data, including medical history, events of note, physical examination and test results as part of my evaluation.
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
## 2014-08-30 NOTE — Progress Notes (Signed)
CRITICAL VALUE ALERT  Critical value received:  Gent 3.5  Date of notification:  08/30/14  Time of notification:  0530  Critical value read back:y  Nurse who received alert:  Cicero  MD notified (1st page):  pharmacy  Time of first page: [phone]  MD notified (2nd page):  Time of second page:  Responding MD:  pharmacist  Time MD responded: [phone]

## 2014-08-30 NOTE — Progress Notes (Signed)
ANTIBIOTIC CONSULT NOTE - FOLLOW UP  Pharmacy Consult for Gentamicin Indication: Open fracture treatment  Allergies  Allergen Reactions  . Codeine Nausea And Vomiting  . Vicodin [Hydrocodone-Acetaminophen] Nausea And Vomiting    Patient Measurements: Height:  (157.5 cm) Weight: 213 lb 3 oz (96.7 kg) IBW/kg (Calculated) : 50.1  Vital Signs: Temp: 101 F (38.3 C) (07/22 0400) Temp Source: Axillary (07/22 0400) BP: 119/46 mmHg (07/22 0500) Pulse Rate: 121 (07/22 0500) Intake/Output from previous day: 07/21 0701 - 07/22 0700 In: 5300.5 [I.V.:4516.6; NG/GT:371.3; IV Piggyback:412.5] Out: 3040 [Urine:2840; Blood:200] Intake/Output from this shift: Total I/O In: 1370 [I.V.:1000; NG/GT:320; IV Piggyback:50] Out: 850 [Urine:850]  Labs:  Recent Labs  08/29/14 0005 08/29/14 0359 08/29/14 1700 08/29/14 2330 08/30/14 0350  WBC 15.3*  --   --  12.1* 12.8*  HGB 10.8*  --   --  9.8* 9.5*  PLT 132*  --   --  149* 145*  CREATININE  --  0.61 0.55  --  0.69   Estimated Creatinine Clearance: 93.3 mL/min (by C-G formula based on Cr of 0.69).  Recent Labs  08/30/14 0350  GENTTROUGH 3.5*     Microbiology: Recent Results (from the past 720 hour(s))  MRSA PCR Screening     Status: None   Collection Time: 08/24/14  7:15 PM  Result Value Ref Range Status   MRSA by PCR NEGATIVE NEGATIVE Final    Comment:        The GeneXpert MRSA Assay (FDA approved for NASAL specimens only), is one component of a comprehensive MRSA colonization surveillance program. It is not intended to diagnose MRSA infection nor to guide or monitor treatment for MRSA infections.   Gram stain     Status: None   Collection Time: 08/29/14  9:22 AM  Result Value Ref Range Status   Specimen Description ABSCESS LEG RIGHT  Final   Special Requests NONE  Final   Gram Stain   Final    MODERATE WBC PRESENT,BOTH PMN AND MONONUCLEAR RARE GRAM VARIABLE ROD GRAM STAIN REVIEWED-AGREE WITH RESULT S  YARBROUGH Gram Stain Report Called to,Read Back By and Verified With: J DIXON,RN AT 1037 08/29/14 BY K BARR    Report Status 08/29/2014 FINAL  Final    Anti-infectives    Start     Dose/Rate Route Frequency Ordered Stop   08/29/14 2200  ceFAZolin (ANCEF) IVPB 2 g/50 mL premix    Comments:  Will adjust duration after wounds assessed in OR tomorrow   2 g 100 mL/hr over 30 Minutes Intravenous 3 times per day 08/29/14 1616 09/01/14 2159   08/29/14 1800  gentamicin (GARAMYCIN) 500 mg in dextrose 5 % 100 mL IVPB     500 mg 112.5 mL/hr over 60 Minutes Intravenous Every 24 hours 08/29/14 1700 09/01/14 1759   08/29/14 1430  gentamicin (GARAMYCIN) IVPB 80 mg     80 mg 100 mL/hr over 30 Minutes Intravenous To Surgery 08/29/14 1420 08/29/14 1500   08/29/14 1344  tobramycin (NEBCIN) powder  Status:  Discontinued       As needed 08/29/14 1344 08/29/14 1556   08/29/14 1343  vancomycin (VANCOCIN) powder  Status:  Discontinued       As needed 08/29/14 1343 08/29/14 1556   08/26/14 1400  ceFAZolin (ANCEF) IVPB 2 g/50 mL premix  Status:  Discontinued    Comments:  Will adjust duration after wounds assessed in OR tomorrow   2 g 100 mL/hr over 30 Minutes Intravenous 3 times per day  08/26/14 1147 08/29/14 1616   08/24/14 1615  ceFAZolin (ANCEF) IVPB 2 g/50 mL premix  Status:  Discontinued     2 g 100 mL/hr over 30 Minutes Intravenous  Once 08/24/14 1602 08/26/14 1206      Assessment: Therapeutic gentamicin random level per [place] Nomogram  Goal of Therapy:  Acceptable level per [place] Nomogram  Plan:  -Continue gentamicin as ordered  Abran Duke 08/30/2014,5:56 AM

## 2014-08-31 ENCOUNTER — Inpatient Hospital Stay (HOSPITAL_COMMUNITY): Payer: Medicaid Other

## 2014-08-31 DIAGNOSIS — Z789 Other specified health status: Secondary | ICD-10-CM

## 2014-08-31 DIAGNOSIS — Z9689 Presence of other specified functional implants: Secondary | ICD-10-CM

## 2014-08-31 DIAGNOSIS — J9601 Acute respiratory failure with hypoxia: Secondary | ICD-10-CM

## 2014-08-31 DIAGNOSIS — Z9911 Dependence on respirator [ventilator] status: Secondary | ICD-10-CM

## 2014-08-31 DIAGNOSIS — S82202B Unspecified fracture of shaft of left tibia, initial encounter for open fracture type I or II: Secondary | ICD-10-CM

## 2014-08-31 DIAGNOSIS — J9690 Respiratory failure, unspecified, unspecified whether with hypoxia or hypercapnia: Secondary | ICD-10-CM | POA: Diagnosis present

## 2014-08-31 DIAGNOSIS — S7292XC Unspecified fracture of left femur, initial encounter for open fracture type IIIA, IIIB, or IIIC: Secondary | ICD-10-CM

## 2014-08-31 DIAGNOSIS — Y939 Activity, unspecified: Secondary | ICD-10-CM

## 2014-08-31 DIAGNOSIS — Y92410 Unspecified street and highway as the place of occurrence of the external cause: Secondary | ICD-10-CM

## 2014-08-31 DIAGNOSIS — S76111A Strain of right quadriceps muscle, fascia and tendon, initial encounter: Secondary | ICD-10-CM

## 2014-08-31 DIAGNOSIS — J9600 Acute respiratory failure, unspecified whether with hypoxia or hypercapnia: Secondary | ICD-10-CM

## 2014-08-31 DIAGNOSIS — S9304XA Dislocation of right ankle joint, initial encounter: Secondary | ICD-10-CM

## 2014-08-31 DIAGNOSIS — Z978 Presence of other specified devices: Secondary | ICD-10-CM | POA: Diagnosis present

## 2014-08-31 DIAGNOSIS — I469 Cardiac arrest, cause unspecified: Secondary | ICD-10-CM

## 2014-08-31 DIAGNOSIS — S92002B Unspecified fracture of left calcaneus, initial encounter for open fracture: Secondary | ICD-10-CM

## 2014-08-31 LAB — GLUCOSE, CAPILLARY
Glucose-Capillary: 166 mg/dL — ABNORMAL HIGH (ref 65–99)
Glucose-Capillary: 147 mg/dL — ABNORMAL HIGH (ref 65–99)
Glucose-Capillary: 148 mg/dL — ABNORMAL HIGH (ref 65–99)
Glucose-Capillary: 142 mg/dL — ABNORMAL HIGH (ref 65–99)
Glucose-Capillary: 158 mg/dL — ABNORMAL HIGH (ref 65–99)

## 2014-08-31 LAB — CBC WITH DIFFERENTIAL/PLATELET
Basophils Absolute: 0 10*3/uL (ref 0.0–0.1)
Basophils Relative: 0 % (ref 0–1)
Eosinophils Absolute: 0.3 10*3/uL (ref 0.0–0.7)
Eosinophils Relative: 2 % (ref 0–5)
HCT: 25.2 % — ABNORMAL LOW (ref 36.0–46.0)
Hemoglobin: 8.3 g/dL — ABNORMAL LOW (ref 12.0–15.0)
Lymphocytes Relative: 10 % — ABNORMAL LOW (ref 12–46)
Lymphs Abs: 1.2 10*3/uL (ref 0.7–4.0)
MCH: 29 pg (ref 26.0–34.0)
MCHC: 32.9 g/dL (ref 30.0–36.0)
MCV: 88.1 fL (ref 78.0–100.0)
Monocytes Absolute: 1.6 10*3/uL — ABNORMAL HIGH (ref 0.1–1.0)
Monocytes Relative: 13 % — ABNORMAL HIGH (ref 3–12)
Neutro Abs: 9.8 10*3/uL — ABNORMAL HIGH (ref 1.7–7.7)
Neutrophils Relative %: 75 % (ref 43–77)
Platelets: 169 10*3/uL (ref 150–400)
RBC: 2.86 MIL/uL — ABNORMAL LOW (ref 3.87–5.11)
RDW: 16.7 % — ABNORMAL HIGH (ref 11.5–15.5)
WBC: 13 10*3/uL — ABNORMAL HIGH (ref 4.0–10.5)

## 2014-08-31 LAB — BASIC METABOLIC PANEL
Anion gap: 5 (ref 5–15)
BUN: 9 mg/dL (ref 6–20)
CO2: 30 mmol/L (ref 22–32)
Calcium: 8 mg/dL — ABNORMAL LOW (ref 8.9–10.3)
Chloride: 104 mmol/L (ref 101–111)
Creatinine, Ser: 0.59 mg/dL (ref 0.44–1.00)
GFR calc Af Amer: >60 mL/min (ref >60)
GFR calc non Af Amer: >60 mL/min (ref >60)
Glucose, Bld: 168 mg/dL — ABNORMAL HIGH (ref 65–99)
Potassium: 3.4 mmol/L — ABNORMAL LOW (ref 3.5–5.1)
Sodium: 139 mmol/L (ref 135–145)

## 2014-08-31 LAB — MAGNESIUM: Magnesium: 1.8 mg/dL (ref 1.7–2.4)

## 2014-08-31 LAB — PHOSPHORUS: Phosphorus: 1.5 mg/dL — ABNORMAL LOW (ref 2.5–4.6)

## 2014-08-31 MED ORDER — DOCUSATE SODIUM 50 MG/5ML PO LIQD: 50.0000 mg | Freq: Every day | ORAL | Status: DC

## 2014-08-31 MED ORDER — POLYETHYLENE GLYCOL 3350 17 G PO PACK
17.0000 g | PACK | Freq: Every day | ORAL | Status: DC | PRN
  Administered 2014-08-31 – 2014-09-05 (×3): 17 g
  Filled 2014-08-31 (×4): qty 1

## 2014-08-31 MED ORDER — POTASSIUM PHOSPHATES 15 MMOLE/5ML IV SOLN
30.0000 mmol | Freq: Once | INTRAVENOUS | Status: AC
  Administered 2014-08-31: 30 mmol via INTRAVENOUS
  Filled 2014-08-31: qty 10

## 2014-08-31 MED ORDER — FUROSEMIDE 10 MG/ML IJ SOLN
40.0000 mg | Freq: Four times a day (QID) | INTRAMUSCULAR | Status: AC
  Administered 2014-08-31 (×2): 40 mg via INTRAVENOUS
  Filled 2014-08-31 (×2): qty 4

## 2014-08-31 NOTE — Progress Notes (Signed)
Spoke with Dr Dwain Sarna about concerns with ETT moving frequently. It appears that every 3-4 hours the tube has to be advanced back to 21 from 18-19 at the lip. Pt has copious oral secretions and coughing frequently which is causing the ett to slide. Air is being added to cuff frequently. Will monitor leak of cuff and ETT. Due to c-collar re-intubation would be difficult per Dr Dwain Sarna and wants to try to avoid if possible. RT will continue to monitor closely. ETT to be secured at 21 at the top lip.

## 2014-08-31 NOTE — Progress Notes (Signed)
eLink Physician-Brief Progress Note Patient Name: [name] DOB: Dec 31, 1965 MRN: [id number]   Date of Service  08/31/2014  HPI/Events of Note  Hypokalemia and hypophosphatemia  eICU Interventions  Potassium and phos replaced     Intervention Category Intermediate Interventions: Electrolyte abnormality - evaluation and management  Akaash Vandewater 08/31/2014, 6:18 AM
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
## 2014-08-31 NOTE — Progress Notes (Signed)
RN called to assess pt's ETT. Prior documentation had been 21, and pt's ETT was at 16cm upon arrival. Advanced 5cm to prior position. Pt has BBS, Sa02 100%, and comparable PIP. Waiting CXR results.

## 2014-08-31 NOTE — Progress Notes (Signed)
2 Days Post-Op  Subjective: Weaned for 2.5 hours this am now back on support, not following commands  Objective: Vital signs in last 24 hours: Temp:  [98.3 F (36.8 C)-102.3 F (39.1 C)] 98.3 F (36.8 C) (07/23 0800) Pulse Rate:  [98-141] 121 (07/23 0735) Resp:  [12-19] 19 (07/23 0735) BP: (96-218)/(43-93) 116/52 mmHg (07/23 0735) SpO2:  [99 %-100 %] 100 % (07/23 0735) Arterial Line BP: (81-213)/(45-85) 81/69 mmHg (07/23 0600) FiO2 (%):  [30 %] 30 % (07/23 0802) Weight:  [97.4 kg (214 lb 11.7 oz)] 97.4 kg (214 lb 11.7 oz) (07/23 0351)    Intake/Output from previous day: 07/22 0701 - 07/23 0700 In: 4676.4 [I.V.:3063.9; NG/GT:840; IV Piggyback:772.5] Out: 1695 [Urine:1695] Intake/Output this shift: Total I/O In: 464 [I.V.:344; NG/GT:120] Out: 250 [Urine:250]  General appearance: not responsive, intubated Resp: diminished breath sounds bibasilar Cardio: tachycardic rr GI: soft nt  Lab Results:   Recent Labs  08/30/14 0350 08/31/14 0455  WBC 12.8* 13.0*  HGB 9.5* 8.3*  HCT 28.3* 25.2*  PLT 145* 169   BMET  Recent Labs  08/30/14 0350 08/31/14 0455  NA 143 139  K 3.6 3.4*  CL 109 104  CO2 28 30  GLUCOSE 144* 168*  BUN 6 9  CREATININE 0.69 0.59  CALCIUM 7.7* 8.0*   PT/INR No results for input(s): LABPROT, INR in the last 72 hours. ABG No results for input(s): PHART, HCO3 in the last 72 hours.  Invalid input(s): PCO2, PO2  Studies/Results: Dg Tibia/fibula Right  08/29/2014   CLINICAL DATA:  49 year old female undergoing extensive right lower extremity fixation.  EXAM: RIGHT TIBIA AND FIBULA - 2 VIEW; RIGHT ANKLE - COMPLETE 3+ VIEW; RIGHT FEMUR 2 VIEWS  COMPARISON:  Prior radiographs of the right femur, lower leg and ankle 08/24/2014  FINDINGS: Fluoroscopic images demonstrate interval removal of external fixation pins and ORIF of tibial shaft fracture with an intra medullary nail including 2 proximal and 2 distal interlocking screws. There is a mildly  displaced fracture through the mid fibula. No hardware associated with this abnormality.  Additional imaging of the thigh demonstrates fixation of the comminuted distal femoral fracture with a lateral buttress plate and screw construct. Alignment of the fracture fragments has slightly improved compared to prior imaging. No evidence of immediate hardware complication.  Finally, imaging of the ankle demonstrate K-wire fusion of the subtalar joint. The pins enter through the inferior aspect of the calcaneus and traverse into the talus.  IMPRESSION: Open reduction and internal fixation of the complex distal femoral fracture, mid tibial shaft fracture and external fixation of the comminuted calcaneal fracture across the subtalar joint.   Electronically Signed   By: Malachy Moan M.D.   On: 08/29/2014 15:54   Dg Ankle Complete Right  08/29/2014   CLINICAL DATA:  49 year old female undergoing extensive right lower extremity fixation.  EXAM: RIGHT TIBIA AND FIBULA - 2 VIEW; RIGHT ANKLE - COMPLETE 3+ VIEW; RIGHT FEMUR 2 VIEWS  COMPARISON:  Prior radiographs of the right femur, lower leg and ankle 08/24/2014  FINDINGS: Fluoroscopic images demonstrate interval removal of external fixation pins and ORIF of tibial shaft fracture with an intra medullary nail including 2 proximal and 2 distal interlocking screws. There is a mildly displaced fracture through the mid fibula. No hardware associated with this abnormality.  Additional imaging of the thigh demonstrates fixation of the comminuted distal femoral fracture with a lateral buttress plate and screw construct. Alignment of the fracture fragments has slightly improved compared to prior  imaging. No evidence of immediate hardware complication.  Finally, imaging of the ankle demonstrate K-wire fusion of the subtalar joint. The pins enter through the inferior aspect of the calcaneus and traverse into the talus.  IMPRESSION: Open reduction and internal fixation of the complex  distal femoral fracture, mid tibial shaft fracture and external fixation of the comminuted calcaneal fracture across the subtalar joint.   Electronically Signed   By: Malachy Moan M.D.   On: 08/29/2014 15:54   Dg Chest Port 1 View  08/31/2014   CLINICAL DATA:  Check endotracheal tube placement  EXAM: PORTABLE CHEST - 1 VIEW  COMPARISON:  08/31/14  FINDINGS: Cardiac shadow is stable. Endotracheal tube is again identified at the thoracic inlet relatively stable from the prior exam. A left central venous line is again noted and stable. Nasogastric catheter is seen within the stomach. Left basilar infiltrate is noted stable from the previous exam.  IMPRESSION: No significant interval change. The endotracheal tube again lies at the level of the thoracic inlet.   Electronically Signed   By: Alcide Clever M.D.   On: 08/31/2014 07:42   Dg Chest Port 1 View  08/31/2014   CLINICAL DATA:  Respiratory failure, acute onset. Initial encounter.  EXAM: PORTABLE CHEST - 1 VIEW  COMPARISON:  Chest radiograph performed 08/30/2014  FINDINGS: The patient's endotracheal tube is seen ending 8 cm above the carina. This could be advanced approximately 5 cm. The enteric tube is noted extending below the diaphragm. A left subclavian line is noted ending about the proximal SVC.  The lungs are hypoexpanded. Vascular crowding and mild vascular congestion are seen. Minimal bilateral atelectasis noted. No definite pleural effusion or pneumothorax is seen.  The cardiomediastinal silhouette is borderline enlarged. No acute osseous abnormalities are identified.  IMPRESSION: 1. Endotracheal tube seen ending 8 cm above the carina. This could be advanced approximately 5 cm. 2. Lungs hypoexpanded. Mild vascular congestion and borderline cardiomegaly noted. Minimal bilateral atelectasis seen.   Electronically Signed   By: Roanna Raider M.D.   On: 08/31/2014 06:00   Dg Chest Port 1 View  08/30/2014   CLINICAL DATA:  Chest trauma.  EXAM:  PORTABLE CHEST - 1 VIEW  COMPARISON:  August 29, 2014.  FINDINGS: Stable cardiomediastinal silhouette. No pneumothorax is noted. Endotracheal and nasogastric tubes are in grossly good position. Left subclavian catheter line is unchanged with distal tip overlying expected position of the SVC. Mild central pulmonary vascular congestion is noted. Left basilar opacity is noted concerning for subsegmental atelectasis or infiltrate with associated pleural effusion.  IMPRESSION: Support apparatus in grossly good position. Stable left basilar opacity is noted concerning for subsegmental atelectasis or infiltrate with associated pleural effusion.   Electronically Signed   By: Lupita Raider, M.D.   On: 08/30/2014 07:52   Dg Tibia/fibula Right Port  08/30/2014   CLINICAL DATA:  Postop level 1 trauma.  EXAM: PORTABLE RIGHT TIBIA AND FIBULA - 2 VIEW  COMPARISON:  Intraoperative fluoroscopy from yesterday  FINDINGS: The comminuted calcaneus fracture has undergone reduction with antibiotic cement placement and K-wire fixation across the subtalar joint. Fracture alignment appear stable since intraoperative fluoroscopy.  Distal tibia diaphysis fracture with anatomic alignment and an intact traversing intra medullary nail. Segmental fracture of the fibular diaphysis has mild displacement superiorly.  Tibiotalar alignment is maintained.  IMPRESSION: 1. Calcaneus fracture status post ORIF and subtalar fixation. Alignment is stable from fluoroscopy yesterday. 2. Tibia and fibula shaft fractures with tibial nail.   Electronically  Signed   By: Marnee Spring M.D.   On: 08/30/2014 10:15   Dg Ankle Right Port  08/30/2014   CLINICAL DATA:  Postop level 1 trauma  EXAM: PORTABLE RIGHT ANKLE - 2 VIEW  COMPARISON:  None.  FINDINGS: IM nail with distal interlocking screws transfixing a tibial shaft fracture.  Segmental mid/distal fibular shaft fracture.  K-wires transfixing the subtalar joint with cement augmenting a comminuted calcaneal  fracture.  IMPRESSION: Postsurgical changes, as above.   Electronically Signed   By: Charline Bills M.D.   On: 08/30/2014 10:16   Dg Hand Complete Right  08/29/2014   CLINICAL DATA:  49 year old female with trauma and right hand swelling.  EXAM: Loops  COMPARISON:  Radiograph dated 08/1914  FINDINGS: No acute fracture or dislocation. There is diffuse soft swelling of the hand. No radiopaque foreign object or soft tissue gas identified.  IMPRESSION: Diffuse soft tissue swelling.  No acute osseous pathology.   Electronically Signed   By: Elgie Collard M.D.   On: 08/29/2014 18:27   Dg Femur, Min 2 Views Right  08/29/2014   CLINICAL DATA:  49 year old female undergoing extensive right lower extremity fixation.  EXAM: RIGHT TIBIA AND FIBULA - 2 VIEW; RIGHT ANKLE - COMPLETE 3+ VIEW; RIGHT FEMUR 2 VIEWS  COMPARISON:  Prior radiographs of the right femur, lower leg and ankle 08/24/2014  FINDINGS: Fluoroscopic images demonstrate interval removal of external fixation pins and ORIF of tibial shaft fracture with an intra medullary nail including 2 proximal and 2 distal interlocking screws. There is a mildly displaced fracture through the mid fibula. No hardware associated with this abnormality.  Additional imaging of the thigh demonstrates fixation of the comminuted distal femoral fracture with a lateral buttress plate and screw construct. Alignment of the fracture fragments has slightly improved compared to prior imaging. No evidence of immediate hardware complication.  Finally, imaging of the ankle demonstrate K-wire fusion of the subtalar joint. The pins enter through the inferior aspect of the calcaneus and traverse into the talus.  IMPRESSION: Open reduction and internal fixation of the complex distal femoral fracture, mid tibial shaft fracture and external fixation of the comminuted calcaneal fracture across the subtalar joint.   Electronically Signed   By: Malachy Moan M.D.   On: 08/29/2014 15:54   Dg  Femur Port, Min 2 Views Right  08/30/2014   CLINICAL DATA:  Status post ORIF of right femoral fracture  EXAM: RIGHT FEMUR PORTABLE 1 VIEW  COMPARISON:  08/29/2014  FINDINGS: Fixation sideplate is noted along the lateral aspect of the right femur. Multiple proximal and distal fixation screws are noted. Medullary rod is noted within the proximal tibia. The fracture fragments are in near anatomic alignment. Antibiotic beads are seen.  IMPRESSION: Postoperative changes stable from the prior exam.   Electronically Signed   By: Alcide Clever M.D.   On: 08/30/2014 10:08    Anti-infectives: Anti-infectives    Start     Dose/Rate Route Frequency Ordered Stop   08/29/14 2200  ceFAZolin (ANCEF) IVPB 2 g/50 mL premix    Comments:  Will adjust duration after wounds assessed in OR tomorrow   2 g 100 mL/hr over 30 Minutes Intravenous 3 times per day 08/29/14 1616 09/01/14 2159   08/29/14 1800  gentamicin (GARAMYCIN) 500 mg in dextrose 5 % 100 mL IVPB     500 mg 112.5 mL/hr over 60 Minutes Intravenous Every 24 hours 08/29/14 1700 09/01/14 1759   08/29/14 1430  gentamicin (GARAMYCIN) IVPB  80 mg     80 mg 100 mL/hr over 30 Minutes Intravenous To Surgery 08/29/14 1420 08/29/14 1500   08/29/14 1344  tobramycin (NEBCIN) powder  Status:  Discontinued       As needed 08/29/14 1344 08/29/14 1556   08/29/14 1343  vancomycin (VANCOCIN) powder  Status:  Discontinued       As needed 08/29/14 1343 08/29/14 1556   08/26/14 1400  ceFAZolin (ANCEF) IVPB 2 g/50 mL premix  Status:  Discontinued    Comments:  Will adjust duration after wounds assessed in OR tomorrow   2 g 100 mL/hr over 30 Minutes Intravenous 3 times per day 08/26/14 1147 08/29/14 1616   08/24/14 1615  ceFAZolin (ANCEF) IVPB 2 g/50 mL premix  Status:  Discontinued     2 g 100 mL/hr over 30 Minutes Intravenous  Once 08/24/14 1602 08/26/14 1206      Assessment/Plan:   1. Neuro- not really responding, anoxic injury 2. pulm weaning, I think she may need  trach, discussed with rt and nursing tube has small leak and appears when coughing and due to secretions it is coming out a little, I think changing over bougie right now is more risky, will sedate when not weaning 3. Gi cont tube feeds at goal 4. Id and ccm following    [facility] 08/31/2014

## 2014-09-01 ENCOUNTER — Inpatient Hospital Stay (HOSPITAL_COMMUNITY): Payer: Medicaid Other

## 2014-09-01 DIAGNOSIS — A499 Bacterial infection, unspecified: Secondary | ICD-10-CM

## 2014-09-01 LAB — CBC
HCT: 25.7 % — ABNORMAL LOW (ref 36.0–46.0)
Hemoglobin: 8.5 g/dL — ABNORMAL LOW (ref 12.0–15.0)
MCH: 29.9 pg (ref 26.0–34.0)
MCHC: 33.1 g/dL (ref 30.0–36.0)
MCV: 90.5 fL (ref 78.0–100.0)
Platelets: 195 10*3/uL (ref 150–400)
RBC: 2.84 MIL/uL — ABNORMAL LOW (ref 3.87–5.11)
RDW: 16.8 % — ABNORMAL HIGH (ref 11.5–15.5)
WBC: 16.3 10*3/uL — ABNORMAL HIGH (ref 4.0–10.5)

## 2014-09-01 LAB — BASIC METABOLIC PANEL
Anion gap: 8 (ref 5–15)
BUN: 15 mg/dL (ref 6–20)
CO2: 34 mmol/L — ABNORMAL HIGH (ref 22–32)
Calcium: 8.4 mg/dL — ABNORMAL LOW (ref 8.9–10.3)
Chloride: 98 mmol/L — ABNORMAL LOW (ref 101–111)
Creatinine, Ser: 0.63 mg/dL (ref 0.44–1.00)
GFR calc Af Amer: >60 mL/min (ref >60)
GFR calc non Af Amer: >60 mL/min (ref >60)
Glucose, Bld: 155 mg/dL — ABNORMAL HIGH (ref 65–99)
Potassium: 3.5 mmol/L (ref 3.5–5.1)
Sodium: 140 mmol/L (ref 135–145)

## 2014-09-01 LAB — GLUCOSE, CAPILLARY
Glucose-Capillary: 127 mg/dL — ABNORMAL HIGH (ref 65–99)
Glucose-Capillary: 134 mg/dL — ABNORMAL HIGH (ref 65–99)
Glucose-Capillary: 141 mg/dL — ABNORMAL HIGH (ref 65–99)
Glucose-Capillary: 150 mg/dL — ABNORMAL HIGH (ref 65–99)
Glucose-Capillary: 152 mg/dL — ABNORMAL HIGH (ref 65–99)
Glucose-Capillary: 160 mg/dL — ABNORMAL HIGH (ref 65–99)

## 2014-09-01 LAB — BRAIN NATRIURETIC PEPTIDE: B Natriuretic Peptide: 206.8 pg/mL — ABNORMAL HIGH (ref 0.0–100.0)

## 2014-09-01 LAB — CULTURE, ROUTINE-ABSCESS

## 2014-09-01 LAB — MAGNESIUM: Magnesium: 1.9 mg/dL (ref 1.7–2.4)

## 2014-09-01 LAB — HIV ANTIBODY (ROUTINE TESTING W REFLEX): HIV Screen 4th Generation wRfx: NONREACTIVE

## 2014-09-01 LAB — PHOSPHORUS: Phosphorus: 3 mg/dL (ref 2.5–4.6)

## 2014-09-01 MED ORDER — VANCOMYCIN HCL 10 G IV SOLR
1250.0000 mg | Freq: Two times a day (BID) | INTRAVENOUS | Status: DC
  Administered 2014-09-01 – 2014-09-03 (×5): 1250 mg via INTRAVENOUS
  Filled 2014-09-01 (×6): qty 1250

## 2014-09-01 MED ORDER — FUROSEMIDE 10 MG/ML IJ SOLN
40.0000 mg | Freq: Two times a day (BID) | INTRAMUSCULAR | Status: AC
  Administered 2014-09-01 (×2): 40 mg via INTRAVENOUS
  Filled 2014-09-01 (×2): qty 4
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
## 2014-09-01 NOTE — Progress Notes (Signed)
Pt transported to and from 3M07 to CT3. Pt stable throughout with no complications. RT will continue to monitor.

## 2014-09-02 ENCOUNTER — Inpatient Hospital Stay (HOSPITAL_COMMUNITY): Payer: Medicaid Other

## 2014-09-02 DIAGNOSIS — L02415 Cutaneous abscess of right lower limb: Secondary | ICD-10-CM

## 2014-09-02 LAB — BASIC METABOLIC PANEL
Anion gap: 7 (ref 5–15)
BUN: 22 mg/dL — ABNORMAL HIGH (ref 6–20)
CO2: 35 mmol/L — ABNORMAL HIGH (ref 22–32)
Calcium: 8.4 mg/dL — ABNORMAL LOW (ref 8.9–10.3)
Chloride: 96 mmol/L — ABNORMAL LOW (ref 101–111)
Creatinine, Ser: 0.69 mg/dL (ref 0.44–1.00)
GFR calc Af Amer: >60 mL/min (ref >60)
GFR calc non Af Amer: >60 mL/min (ref >60)
Glucose, Bld: 157 mg/dL — ABNORMAL HIGH (ref 65–99)
Potassium: 3.5 mmol/L (ref 3.5–5.1)
Sodium: 138 mmol/L (ref 135–145)

## 2014-09-02 LAB — GLUCOSE, CAPILLARY
Glucose-Capillary: 127 mg/dL — ABNORMAL HIGH (ref 65–99)
Glucose-Capillary: 137 mg/dL — ABNORMAL HIGH (ref 65–99)
Glucose-Capillary: 148 mg/dL — ABNORMAL HIGH (ref 65–99)
Glucose-Capillary: 149 mg/dL — ABNORMAL HIGH (ref 65–99)
Glucose-Capillary: 173 mg/dL — ABNORMAL HIGH (ref 65–99)
Glucose-Capillary: 173 mg/dL — ABNORMAL HIGH (ref 65–99)

## 2014-09-02 LAB — CULTURE, RESPIRATORY
Culture: NORMAL
Special Requests: NORMAL

## 2014-09-02 LAB — CBC
HCT: 26.2 % — ABNORMAL LOW (ref 36.0–46.0)
Hemoglobin: 8.4 g/dL — ABNORMAL LOW (ref 12.0–15.0)
MCH: 29.4 pg (ref 26.0–34.0)
MCHC: 32.1 g/dL (ref 30.0–36.0)
MCV: 91.6 fL (ref 78.0–100.0)
Platelets: 247 10*3/uL (ref 150–400)
RBC: 2.86 MIL/uL — ABNORMAL LOW (ref 3.87–5.11)
RDW: 16.7 % — ABNORMAL HIGH (ref 11.5–15.5)
WBC: 15.2 10*3/uL — ABNORMAL HIGH (ref 4.0–10.5)

## 2014-09-02 MED ORDER — METOPROLOL TARTRATE 25 MG/10 ML ORAL SUSPENSION
25.0000 mg | Freq: Two times a day (BID) | ORAL | Status: DC
  Administered 2014-09-02 – 2014-09-03 (×3): 25 mg
  Filled 2014-09-02 (×4): qty 10

## 2014-09-02 MED ORDER — ENOXAPARIN SODIUM 30 MG/0.3ML ~~LOC~~ SOLN
30.0000 mg | Freq: Two times a day (BID) | SUBCUTANEOUS | Status: DC
  Administered 2014-09-02 – 2014-09-16 (×29): 30 mg via SUBCUTANEOUS
  Filled 2014-09-02 (×37): qty 0.3
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
## 2014-09-02 NOTE — Progress Notes (Signed)
Key Points: Use following P&T approved IV to PO antibiotic change policy.  Description contains the criteria that are approved Note: Policy Excludes:  Esophagectomy patientsPHARMACIST - PHYSICIAN COMMUNICATION DR:   Trauma CONCERNING: IV to Oral Route Change Policy  RECOMMENDATION: This patient is receiving Protonix by the intravenous route.  Based on criteria approved by the Pharmacy and Therapeutics Committee, the intravenous medication(s) is/are being converted to the equivalent oral dose form(s).   DESCRIPTION: These criteria include:  The patient is eating (either orally or via tube) and/or has been taking other orally administered medications for a least 24 hours  The patient has no evidence of active gastrointestinal bleeding or impaired GI absorption (gastrectomy, short bowel, patient on TNA or NPO).  If you have questions about this conversion, please contact the Pharmacy Department    [phone] )  [facility]   [phone] )  [facility]   [phone] )  [facility]   [phone] )  [facility]   [phone] )  [facility]   Misty Stanley [place], [facility] 09/02/2014 11:47 AM  Lateria Alderman S. Merilynn Finland, PharmD, BCPS Clinical Staff Pharmacist Pager [phone]
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
## 2014-09-02 NOTE — Progress Notes (Signed)
Pt has been sitting up in bed, raising arms, throwing arms around disconnecting leads, ventilator and has now removed OG tube. Pt has been given additional pain medicine to try to return pt to comfortable state.  Pt has not had a BM since admission and BS are now hypoactive. Made a call to Trauma MD on call to confirm replacement of OG tube and plan of care for patient safety.
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
## 2014-09-03 ENCOUNTER — Ambulatory Visit (HOSPITAL_COMMUNITY): Payer: Medicaid Other

## 2014-09-03 DIAGNOSIS — J9600 Acute respiratory failure, unspecified whether with hypoxia or hypercapnia: Secondary | ICD-10-CM

## 2014-09-03 LAB — ANAEROBIC CULTURE

## 2014-09-03 LAB — BASIC METABOLIC PANEL
Anion gap: 9 (ref 5–15)
BUN: 20 mg/dL (ref 6–20)
CO2: 31 mmol/L (ref 22–32)
Calcium: 8.5 mg/dL — ABNORMAL LOW (ref 8.9–10.3)
Chloride: 100 mmol/L — ABNORMAL LOW (ref 101–111)
Creatinine, Ser: 0.59 mg/dL (ref 0.44–1.00)
GFR calc Af Amer: >60 mL/min (ref >60)
GFR calc non Af Amer: >60 mL/min (ref >60)
Glucose, Bld: 133 mg/dL — ABNORMAL HIGH (ref 65–99)
Potassium: 4 mmol/L (ref 3.5–5.1)
Sodium: 140 mmol/L (ref 135–145)

## 2014-09-03 LAB — CBC
HCT: 26.2 % — ABNORMAL LOW (ref 36.0–46.0)
Hemoglobin: 8.4 g/dL — ABNORMAL LOW (ref 12.0–15.0)
MCH: 29.5 pg (ref 26.0–34.0)
MCHC: 32.1 g/dL (ref 30.0–36.0)
MCV: 91.9 fL (ref 78.0–100.0)
Platelets: 298 10*3/uL (ref 150–400)
RBC: 2.85 MIL/uL — ABNORMAL LOW (ref 3.87–5.11)
RDW: 16.6 % — ABNORMAL HIGH (ref 11.5–15.5)
WBC: 17.4 10*3/uL — ABNORMAL HIGH (ref 4.0–10.5)

## 2014-09-03 LAB — GLUCOSE, CAPILLARY
Glucose-Capillary: 114 mg/dL — ABNORMAL HIGH (ref 65–99)
Glucose-Capillary: 115 mg/dL — ABNORMAL HIGH (ref 65–99)
Glucose-Capillary: 122 mg/dL — ABNORMAL HIGH (ref 65–99)
Glucose-Capillary: 126 mg/dL — ABNORMAL HIGH (ref 65–99)
Glucose-Capillary: 147 mg/dL — ABNORMAL HIGH (ref 65–99)
Glucose-Capillary: 149 mg/dL — ABNORMAL HIGH (ref 65–99)

## 2014-09-03 LAB — VANCOMYCIN, TROUGH: Vancomycin Tr: 13 ug/mL (ref 10.0–20.0)

## 2014-09-03 MED ORDER — IPRATROPIUM-ALBUTEROL 0.5-2.5 (3) MG/3ML IN SOLN
3.0000 mL | Freq: Four times a day (QID) | RESPIRATORY_TRACT | Status: DC
  Administered 2014-09-03 – 2014-09-07 (×15): 3 mL via RESPIRATORY_TRACT
  Filled 2014-09-03 (×16): qty 3

## 2014-09-03 MED ORDER — IPRATROPIUM-ALBUTEROL 0.5-2.5 (3) MG/3ML IN SOLN
RESPIRATORY_TRACT | Status: AC
  Administered 2014-09-03: 3 mL
  Filled 2014-09-03: qty 3

## 2014-09-03 MED ORDER — DEXMEDETOMIDINE HCL IN NACL 400 MCG/100ML IV SOLN
0.4000 ug/kg/h | INTRAVENOUS | Status: DC
  Administered 2014-09-03: 1.2 ug/kg/h via INTRAVENOUS
  Administered 2014-09-03: 1 ug/kg/h via INTRAVENOUS
  Administered 2014-09-04 – 2014-09-05 (×7): 1.2 ug/kg/h via INTRAVENOUS
  Administered 2014-09-05: 1 ug/kg/h via INTRAVENOUS
  Administered 2014-09-05 (×3): 1.2 ug/kg/h via INTRAVENOUS
  Administered 2014-09-06: 0.8 ug/kg/h via INTRAVENOUS
  Administered 2014-09-06: 1 ug/kg/h via INTRAVENOUS
  Administered 2014-09-06: 0.8 ug/kg/h via INTRAVENOUS
  Administered 2014-09-06 – 2014-09-07 (×3): 1.2 ug/kg/h via INTRAVENOUS
  Administered 2014-09-07: 0.8 ug/kg/h via INTRAVENOUS
  Administered 2014-09-07 (×2): 1.2 ug/kg/h via INTRAVENOUS
  Administered 2014-09-07: 0.8 ug/kg/h via INTRAVENOUS
  Administered 2014-09-08: 1 ug/kg/h via INTRAVENOUS
  Administered 2014-09-08: 1.2 ug/kg/h via INTRAVENOUS
  Administered 2014-09-08: 0.6 ug/kg/h via INTRAVENOUS
  Administered 2014-09-08: 0.4 ug/kg/h via INTRAVENOUS
  Filled 2014-09-03: qty 100
  Filled 2014-09-03: qty 300
  Filled 2014-09-03 (×17): qty 100
  Filled 2014-09-03: qty 200
  Filled 2014-09-03 (×11): qty 100

## 2014-09-03 MED ORDER — DEXMEDETOMIDINE HCL IN NACL 200 MCG/50ML IV SOLN
0.4000 ug/kg/h | INTRAVENOUS | Status: DC
  Administered 2014-09-03: 1.2 ug/kg/h via INTRAVENOUS
  Administered 2014-09-03: 0.4 ug/kg/h via INTRAVENOUS
  Filled 2014-09-03 (×2): qty 50

## 2014-09-03 MED ORDER — LORAZEPAM 2 MG/ML IJ SOLN
INTRAMUSCULAR | Status: AC
  Filled 2014-09-03: qty 1

## 2014-09-03 MED ORDER — OXYCODONE HCL 5 MG/5ML PO SOLN
10.0000 mg | ORAL | Status: DC | PRN
  Administered 2014-09-03 – 2014-09-11 (×6): 10 mg
  Filled 2014-09-03 (×6): qty 10

## 2014-09-03 MED ORDER — VANCOMYCIN HCL IN DEXTROSE 1-5 GM/200ML-% IV SOLN
1000.0000 mg | Freq: Three times a day (TID) | INTRAVENOUS | Status: DC
  Administered 2014-09-04 – 2014-09-05 (×5): 1000 mg via INTRAVENOUS
  Filled 2014-09-03 (×6): qty 200

## 2014-09-03 MED ORDER — LORAZEPAM 2 MG/ML IJ SOLN
1.0000 mg | INTRAMUSCULAR | Status: DC | PRN
  Administered 2014-09-03 – 2014-09-04 (×4): 1 mg via INTRAVENOUS
  Administered 2014-09-04: 2 mg via INTRAVENOUS
  Administered 2014-09-04 (×2): 1 mg via INTRAVENOUS
  Administered 2014-09-05 – 2014-09-08 (×12): 2 mg via INTRAVENOUS
  Filled 2014-09-03 (×18): qty 1

## 2014-09-03 MED ORDER — PIPERACILLIN-TAZOBACTAM 3.375 G IVPB
3.3750 g | Freq: Three times a day (TID) | INTRAVENOUS | Status: DC
  Administered 2014-09-03 – 2014-09-07 (×13): 3.375 g via INTRAVENOUS
  Filled 2014-09-03 (×15): qty 50

## 2014-09-03 MED ORDER — HALOPERIDOL LACTATE 5 MG/ML IJ SOLN
5.0000 mg | Freq: Four times a day (QID) | INTRAMUSCULAR | Status: DC | PRN
Start: 2014-09-03 — End: 2014-09-16
  Administered 2014-09-03 – 2014-09-10 (×7): 5 mg via INTRAVENOUS
  Filled 2014-09-03 (×7): qty 1

## 2014-09-03 NOTE — Progress Notes (Signed)
Echocardiogram 2D Echocardiogram has been performed.  Nolon Rod 09/03/2014, 12:34 PM
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
## 2014-09-03 NOTE — Progress Notes (Signed)
Pt is now on cool mist aerosol 35% through mask to help with stridor. RN aware, RT will continue to monitor pt status.

## 2014-09-03 NOTE — Procedures (Signed)
Extubation Procedure Note  Patient Details:   Name: [name] DOB: [birth year] MRN: [id number]   Airway Documentation:     Evaluation  O2 sats: stable throughout Complications: No apparent complications Patient did tolerate procedure well. Bilateral Breath Sounds: Clear Suctioning: Oral, Airway Yes, Pt. able to hold head off bed on command, FVC-1250cc, NIF-(-20)cmH20, has (+) cuff leak, placed to 4 lpm n/c., s/p Extubation, with no Stridor noted, plan to start Incentive Spirometer, asap,RT to monitor.  Joylene John 09/03/2014, 10:14 AM

## 2014-09-03 NOTE — Progress Notes (Signed)
RT note: Tracheal Aspirate obtained/labelled/sent to lab prior to Extubation per order, Herbert Seta, RN made aware.
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
## 2014-09-03 NOTE — Progress Notes (Signed)
Pt is having some upper airway stridor, RN aware. Cool mist aerosol will be set up to help. RT will continue to monitor
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
## 2014-09-04 ENCOUNTER — Inpatient Hospital Stay (HOSPITAL_COMMUNITY): Payer: Medicaid Other

## 2014-09-04 LAB — CULTURE, BLOOD (ROUTINE X 2)
Culture: NO GROWTH
Culture: NO GROWTH

## 2014-09-04 LAB — CBC
HCT: 25.1 % — ABNORMAL LOW (ref 36.0–46.0)
Hemoglobin: 8.1 g/dL — ABNORMAL LOW (ref 12.0–15.0)
MCH: 29.7 pg (ref 26.0–34.0)
MCHC: 32.3 g/dL (ref 30.0–36.0)
MCV: 91.9 fL (ref 78.0–100.0)
Platelets: 329 10*3/uL (ref 150–400)
RBC: 2.73 MIL/uL — ABNORMAL LOW (ref 3.87–5.11)
RDW: 16.8 % — ABNORMAL HIGH (ref 11.5–15.5)
WBC: 20.8 10*3/uL — ABNORMAL HIGH (ref 4.0–10.5)

## 2014-09-04 LAB — GLUCOSE, CAPILLARY
Glucose-Capillary: 103 mg/dL — ABNORMAL HIGH (ref 65–99)
Glucose-Capillary: 108 mg/dL — ABNORMAL HIGH (ref 65–99)
Glucose-Capillary: 112 mg/dL — ABNORMAL HIGH (ref 65–99)
Glucose-Capillary: 117 mg/dL — ABNORMAL HIGH (ref 65–99)
Glucose-Capillary: 127 mg/dL — ABNORMAL HIGH (ref 65–99)
Glucose-Capillary: 129 mg/dL — ABNORMAL HIGH (ref 65–99)

## 2014-09-04 LAB — URINE CULTURE
Culture: NO GROWTH
Special Requests: NORMAL

## 2014-09-04 MED ORDER — METOPROLOL TARTRATE 1 MG/ML IV SOLN
5.0000 mg | Freq: Three times a day (TID) | INTRAVENOUS | Status: DC
  Filled 2014-09-04 (×3): qty 5

## 2014-09-04 MED ORDER — FENTANYL 50 MCG/HR TD PT72
50.0000 ug | MEDICATED_PATCH | TRANSDERMAL | Status: DC
  Administered 2014-09-04: 50 ug via TRANSDERMAL
  Filled 2014-09-04: qty 1

## 2014-09-04 MED ORDER — VALPROATE SODIUM 500 MG/5ML IV SOLN
500.0000 mg | Freq: Two times a day (BID) | INTRAVENOUS | Status: DC
  Administered 2014-09-04 – 2014-09-06 (×5): 500 mg via INTRAVENOUS
  Filled 2014-09-04 (×6): qty 5

## 2014-09-04 MED ORDER — WHITE PETROLATUM GEL
Status: AC
  Administered 2014-09-04: 0.2
  Filled 2014-09-04: qty 1

## 2014-09-04 MED ORDER — METOPROLOL TARTRATE 1 MG/ML IV SOLN
10.0000 mg | Freq: Four times a day (QID) | INTRAVENOUS | Status: DC
  Administered 2014-09-04 – 2014-09-05 (×5): 10 mg via INTRAVENOUS
  Filled 2014-09-04 (×10): qty 10

## 2014-09-04 NOTE — Progress Notes (Signed)
Chaplain initiated visit with pt family member at bedside. Chaplain introduced herself and her services. Chaplain offered prayer at pt request. Chaplain will continue to follow.   09/04/14 1100  Clinical Encounter Type  Visited With Family  Visit Type Initial;Spiritual support  Spiritual Encounters  Spiritual Needs Emotional;Prayer  Stress Factors  Family Stress Factors Health changes  Khi Mcmillen, Mayer Masker, Chaplain 09/04/2014 11:26 AM
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
## 2014-09-04 NOTE — Progress Notes (Signed)
[facility]  Date of Admission:  08/24/2014  Antibiotics: Vancomycin Zosyn day 2  Subjective: agitation  Objective: Temp:  [99.3 F (37.4 C)-100.1 F (37.8 C)] 99.5 F (37.5 C) (07/27 0800) Pulse Rate:  [78-143] 96 (07/27 0900) Resp:  [20-38] 28 (07/27 0900) BP: (82-178)/(45-95) 131/60 mmHg (07/27 0900) SpO2:  [96 %-100 %] 100 % (07/27 0900) FiO2 (%):  [35 %-55 %] 55 % (07/27 0900) Weight:  [190 lb 11.2 oz (86.5 kg)] 190 lb 11.2 oz (86.5 kg) (07/27 0500)  General: awake, eyes open, extubated Skin: no rashes Lungs: CTA B, anterior exam Cor: RRR Abdomen: soft, nt, nd Ext: right wrapped, no edema; bloody oozing out of right leg at superior portion of sutures, also over left leg wound, no pus, no surrounding erythema Neuro: does not follow commands  Lab Results Lab Results  Component Value Date   WBC 20.8* 09/04/2014   HGB 8.1* 09/04/2014   HCT 25.1* 09/04/2014   MCV 91.9 09/04/2014   PLT 329 09/04/2014    Lab Results  Component Value Date   CREATININE 0.59 09/03/2014   BUN 20 09/03/2014   NA 140 09/03/2014   K 4.0 09/03/2014   CL 100* 09/03/2014   CO2 31 09/03/2014    Lab Results  Component Value Date   ALT 59* 08/27/2014   AST 112* 08/27/2014   ALKPHOS 37* 08/27/2014   BILITOT 1.1 08/27/2014      Microbiology: Recent Results (from the past 240 hour(s))  Gram stain     Status: None   Collection Time: 08/29/14  9:22 AM  Result Value Ref Range Status   Specimen Description ABSCESS LEG RIGHT  Final   Special Requests NONE  Final   Gram Stain   Final    MODERATE WBC PRESENT,BOTH PMN AND MONONUCLEAR RARE GRAM VARIABLE ROD GRAM STAIN REVIEWED-AGREE WITH RESULT S YARBROUGH Gram Stain Report Called to,Read Back By and Verified With: J DIXON,RN AT 1037 08/29/14 BY K BARR    Report Status 08/29/2014 FINAL  Final  AFB culture with smear     Status: None (Preliminary result)   Collection Time: 08/29/14  9:22 AM  Result Value Ref  Range Status   Specimen Description ABSCESS LEG RIGHT  Final   Special Requests NONE  Final   Acid Fast Smear   Final    NO ACID FAST BACILLI SEEN Performed at [facility]    Culture   Final    CULTURE WILL BE EXAMINED FOR 6 WEEKS BEFORE ISSUING A FINAL REPORT Performed at [facility]    Report Status PENDING  Incomplete  Anaerobic culture     Status: None   Collection Time: 08/29/14  9:22 AM  Result Value Ref Range Status   Specimen Description ABSCESS LEG RIGHT  Final   Special Requests NONE  Final   Gram Stain   Final    MODERATE WBC PRESENT,BOTH PMN AND MONONUCLEAR NO SQUAMOUS EPITHELIAL CELLS SEEN RARE GRAM VARIABLE ROD Performed at [facility]    Culture   Final    NO ANAEROBES ISOLATED Performed at [facility]    Report Status 09/03/2014 FINAL  Final  Culture, routine-abscess     Status: None   Collection Time: 08/29/14  9:22 AM  Result Value Ref Range Status   Specimen Description ABSCESS LEG RIGHT  Final   Special Requests NONE  Final   Gram Stain   Final    MODERATE WBC PRESENT,BOTH  PMN AND MONONUCLEAR NO SQUAMOUS EPITHELIAL CELLS SEEN RARE GRAM VARIABLE ROD Gram Stain Report Called to,Read Back By and Verified With: Gram Stain Report Called to,Read Back By and Verified With: Lilian Coma RN 10:37 08/29/14 BY Lucrezia Europe Performed at [facility] Performed at [facility]    Culture   Final    FEW BACILLUS SPECIES Note: Standardized susceptibility testing for this organism is not available. Performed at [facility]    Report Status 09/01/2014 FINAL  Final  Culture, respiratory (NON-Expectorated)     Status: None   Collection Time: 08/30/14 12:37 PM  Result Value Ref Range Status   Specimen Description TRACHEAL ASPIRATE  Final   Special Requests Normal  Final   Gram Stain   Final    FEW WBC PRESENT,BOTH PMN AND MONONUCLEAR RARE SQUAMOUS EPITHELIAL CELLS PRESENT FEW GRAM POSITIVE COCCI IN PAIRS FEW GRAM  NEGATIVE RODS Performed at [facility]    Culture   Final    NORMAL OROPHARYNGEAL FLORA Performed at [facility]    Report Status 09/02/2014 FINAL  Final  Culture, blood (routine x 2)     Status: None (Preliminary result)   Collection Time: 08/30/14  2:20 PM  Result Value Ref Range Status   Specimen Description BLOOD RIGHT HAND  Final   Special Requests BOTTLES DRAWN AEROBIC AND ANAEROBIC 8CC CC  Final   Culture NO GROWTH 4 DAYS  Final   Report Status PENDING  Incomplete  Culture, blood (routine x 2)     Status: None (Preliminary result)   Collection Time: 08/30/14  2:37 PM  Result Value Ref Range Status   Specimen Description BLOOD RIGHT HAND  Final   Special Requests BOTTLES DRAWN AEROBIC AND ANAEROBIC 8CC 8CC  Final   Culture NO GROWTH 4 DAYS  Final   Report Status PENDING  Incomplete  Culture, respiratory (NON-Expectorated)     Status: None (Preliminary result)   Collection Time: 09/03/14 10:00 AM  Result Value Ref Range Status   Specimen Description TRACHEAL ASPIRATE  Final   Special Requests Normal  Final   Gram Stain   Final    RARE WBC PRESENT,BOTH PMN AND MONONUCLEAR NO SQUAMOUS EPITHELIAL CELLS SEEN NO ORGANISMS SEEN Performed at [facility]    Culture   Final    NO GROWTH 1 DAY Performed at [facility]    Report Status PENDING  Incomplete    Studies/Results: Dg Chest Port 1 View  09/04/2014   CLINICAL DATA:  Respiratory failure recent orthopedic surgical procedures  EXAM: PORTABLE CHEST - 1 VIEW  COMPARISON:  Portable chest x-ray of September 02, 2014  FINDINGS: There has been interval extubation of the trachea and of the esophagus. The right hemidiaphragm remains higher than the left. There is improving atelectasis or infiltrate in the left lower lobe. There is no significant pleural effusion. The cardiac silhouette remains enlarged. The pulmonary vascularity is less engorged. The left subclavian venous catheter tip lies at the  junction of the right and left brachiocephalic veins.  IMPRESSION: Mild interval improvement in the left lower lobe atelectasis. A trace left pleural effusion persists.   Electronically Signed   By: David  Swaziland M.D.   On: 09/04/2014 08:04   Dg Abd Portable 1v  09/02/2014   CLINICAL DATA:  Orogastric tube placement  EXAM: PORTABLE ABDOMEN - 1 VIEW  COMPARISON:  None.  FINDINGS: The orogastric tube is coiled in the stomach. The tip is in the fundus.  No disproportionate dilatation of bowel. No obvious free intraperitoneal gas.  IMPRESSION: Orogastric tube tip is in the fundus of the stomach.   Electronically Signed   By: Jolaine Click M.D.   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  Vancomycin typically sensitive.  Would continue for about 7 days    2) fever - work up underway, empiric zosyn with vancomyin.  Continue for now, will monitor cultures. Afebrile 24 hours.   Staci Righter, MD [facility] [email or web address] [phone] pager   [phone] cell 09/04/2014, 10:03 AM

## 2014-09-04 NOTE — Progress Notes (Signed)
SLP Cancellation Note  Patient Details Name: Kameshia Madruga MRN: [id number] DOB: [birth year]   Cancelled treatment:       Reason Eval/Treat Not Completed: Medical issues which prohibited therapy. Pt sedated for agitation, not able to participate in TBI team evaluation at this time. Will continue to follow as able for cognitive and swallowing evaluations.   Maxcine Ham, M.A. CCC-SLP [phone]  Maxcine Ham 09/04/2014, 1:44 PM

## 2014-09-04 NOTE — Progress Notes (Signed)
PT Cancellation Note  Patient Details Name: [name] MRN: [id number] DOB: Sep 12, 1965   Cancelled Treatment:   Reason Eval/Treat Not Completed: Medical issues which prohibited therapy. Pt sedated for agitation, not able to participate in TBI team evaluation at this time.  Fabio Asa 09/04/2014, 2:14 PM Charlotte Crumb, PT DPT  [phone]

## 2014-09-04 NOTE — Progress Notes (Signed)
OT Cancellation Note  Patient Details Name: [name] MRN: [id number] DOB: Jun 02, 1965   Cancelled Treatment:    Reason Eval/Treat Not Completed: Other (comment);Medical issues which prohibited therapy Pt sedated due to agitation. Will complete TBI evaluation tomorrow. [facility] [place]ring, OTR/L  [phone] 09/04/2014 09/04/2014, 2:18 PM

## 2014-09-05 ENCOUNTER — Inpatient Hospital Stay (HOSPITAL_COMMUNITY): Payer: Medicaid Other

## 2014-09-05 LAB — GLUCOSE, CAPILLARY
Glucose-Capillary: 110 mg/dL — ABNORMAL HIGH (ref 65–99)
Glucose-Capillary: 123 mg/dL — ABNORMAL HIGH (ref 65–99)
Glucose-Capillary: 124 mg/dL — ABNORMAL HIGH (ref 65–99)
Glucose-Capillary: 127 mg/dL — ABNORMAL HIGH (ref 65–99)
Glucose-Capillary: 131 mg/dL — ABNORMAL HIGH (ref 65–99)
Glucose-Capillary: 134 mg/dL — ABNORMAL HIGH (ref 65–99)

## 2014-09-05 LAB — BASIC METABOLIC PANEL
Anion gap: 9 (ref 5–15)
BUN: 12 mg/dL (ref 6–20)
CO2: 24 mmol/L (ref 22–32)
Calcium: 8.5 mg/dL — ABNORMAL LOW (ref 8.9–10.3)
Chloride: 107 mmol/L (ref 101–111)
Creatinine, Ser: 0.71 mg/dL (ref 0.44–1.00)
GFR calc Af Amer: >60 mL/min (ref >60)
GFR calc non Af Amer: >60 mL/min (ref >60)
Glucose, Bld: 137 mg/dL — ABNORMAL HIGH (ref 65–99)
Potassium: 3.8 mmol/L (ref 3.5–5.1)
Sodium: 140 mmol/L (ref 135–145)

## 2014-09-05 LAB — CBC
HCT: 27.6 % — ABNORMAL LOW (ref 36.0–46.0)
Hemoglobin: 8.9 g/dL — ABNORMAL LOW (ref 12.0–15.0)
MCH: 29.7 pg (ref 26.0–34.0)
MCHC: 32.2 g/dL (ref 30.0–36.0)
MCV: 92 fL (ref 78.0–100.0)
Platelets: 359 10*3/uL (ref 150–400)
RBC: 3 MIL/uL — ABNORMAL LOW (ref 3.87–5.11)
RDW: 16.5 % — ABNORMAL HIGH (ref 11.5–15.5)
WBC: 14.1 10*3/uL — ABNORMAL HIGH (ref 4.0–10.5)

## 2014-09-05 LAB — CULTURE, RESPIRATORY
Culture: NO GROWTH
Special Requests: NORMAL

## 2014-09-05 LAB — VANCOMYCIN, TROUGH: Vancomycin Tr: 29 ug/mL (ref 10.0–20.0)

## 2014-09-05 LAB — CULTURE, RESPIRATORY W GRAM STAIN

## 2014-09-05 MED ORDER — VANCOMYCIN HCL 10 G IV SOLR
1250.0000 mg | Freq: Two times a day (BID) | INTRAVENOUS | Status: AC
  Administered 2014-09-05 – 2014-09-08 (×6): 1250 mg via INTRAVENOUS
  Filled 2014-09-05 (×6): qty 1250

## 2014-09-05 MED ORDER — ESCITALOPRAM OXALATE 20 MG PO TABS
20.0000 mg | ORAL_TABLET | Freq: Every day | ORAL | Status: DC
  Administered 2014-09-05 – 2014-09-16 (×12): 20 mg
  Filled 2014-09-05 (×12): qty 1

## 2014-09-05 MED ORDER — CLONAZEPAM 0.1 MG/ML ORAL SUSPENSION: 0.2500 mg | Freq: Two times a day (BID) | ORAL | Status: DC

## 2014-09-05 MED ORDER — PIVOT 1.5 CAL PO LIQD
1000.0000 mL | ORAL | Status: DC
  Filled 2014-09-05 (×2): qty 1000

## 2014-09-05 MED ORDER — QUETIAPINE FUMARATE 50 MG PO TABS
50.0000 mg | ORAL_TABLET | Freq: Two times a day (BID) | ORAL | Status: DC
  Administered 2014-09-05 (×2): 50 mg
  Filled 2014-09-05 (×4): qty 1

## 2014-09-05 MED ORDER — PIVOT 1.5 CAL PO LIQD
1000.0000 mL | ORAL | Status: DC
  Administered 2014-09-05 – 2014-09-12 (×9): 1000 mL
  Filled 2014-09-05 (×18): qty 1000

## 2014-09-05 MED ORDER — PRO-STAT SUGAR FREE PO LIQD
30.0000 mL | Freq: Every day | ORAL | Status: DC
  Administered 2014-09-05 – 2014-09-16 (×11): 30 mL
  Filled 2014-09-05 (×12): qty 30

## 2014-09-05 MED ORDER — CLONAZEPAM 0.5 MG PO TABS
0.2500 mg | ORAL_TABLET | Freq: Two times a day (BID) | ORAL | Status: DC
  Administered 2014-09-05 (×2): 0.25 mg
  Filled 2014-09-05 (×2): qty 1
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
## 2014-09-05 NOTE — Progress Notes (Signed)
Rehab Admissions Coordinator Note:  Patient was screened by Trish Mage for appropriateness for an Inpatient Acute Rehab Consult.  At this time, we are recommending Inpatient Rehab consult.  Trish Mage 09/05/2014, 1:53 PM  I can be reached at [phone].

## 2014-09-05 NOTE — Progress Notes (Signed)
Pt vanc trough was drawn by lab around 0730. 0810 started the scheduled vancomycin. At 0830 received a critical result of 29 for the vanc trough. Called pharmacy and spoke with Marcelino Duster who told me to stop the current vanc drip. At [phone] stopped the secondary drip per pharmacy.
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
## 2014-09-05 NOTE — Clinical Social Work Note (Addendum)
Clinical Social Work Assessment  Patient Details  Name: [name] MRN: [id number] Date of Birth: 01-12-1966  Date of referral:  09/05/14               Reason for consult:  Facility Placement                Permission sought to share information with:  Family Supports Permission granted to share information::  No (pt is not oriented at this time)  Name::     Janine Limbo and Bennye Alm  Agency::  local SNF  Relationship::  daughter  Contact Information:     Housing/Transportation Living arrangements for the past 2 months:  Single Family Home Source of Information:  Adult Children Patient Interpreter Needed:  None Criminal Activity/Legal Involvement Pertinent to Current Situation/Hospitalization:  No - Comment as needed Significant Relationships:  Adult Children, Parents, Siblings Lives with:  Adult Children Do you feel safe going back to the place where you live?  Yes Need for family participation in patient care:  Yes (Comment)  Care giving concerns:  Pt is extensive assist right now- pt dtr reports that pt sister and mother might be able to help but could not lift pt   Office manager / plan:  CSW spoke with pt daughter about PT recommendation for rehab when medically stable.  Employment status:    Insurance information:  Self Pay (Medicaid Pending) PT Recommendations:  Inpatient Rehab Consult Information / Referral to community resources:  Skilled Nursing Facility  Patient/Family's Response to care:   Pt daughter expresses understanding for rehab needs and is agreeable to rehab placement- prefers CIR but is agreeable to SNF.  CSW explained barriers to finding local placement due to pt lack of insurance- per pt daughter Medicaid application is pending at this time.    Patient/Family's Understanding of and Emotional Response to Diagnosis, Current Treatment, and Prognosis:  Pt daughter did not express any questions or concerns about pt care at this time  Emotional  Assessment Appearance:    Attitude/Demeanor/Rapport:  Unable to Assess Affect (typically observed):  Unable to Assess Orientation:  Oriented to Self Alcohol / Substance use:  Not Applicable Psych involvement (Current and /or in the community):  No (Comment)  Discharge Needs  Concerns to be addressed:  Care Coordination Readmission within the last 30 days:  No Current discharge risk:  Physical Impairment Barriers to Discharge:  Continued Medical Work up   Izora Ribas, LCSW 09/05/2014, 3:08 PM
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
## 2014-09-05 NOTE — Evaluation (Signed)
Speech Language Pathology Evaluation Patient Details Name: [name] MRN: [id number] DOB: 11/13/1965 Today's Date: 09/05/2014 Time: 1040-1110 SLP Time Calculation (min) (ACUTE ONLY): 30 min  Problem List:  Patient Active Problem List   Diagnosis Date Noted  . Endotracheally intubated   . Respiratory failure   . Open fracture of bone of knee joint 08/24/2014   Past Medical History:  Past Medical History  Diagnosis Date  . Heart murmur   . Hypertension   . MI (myocardial infarction)     3 years ago    Past Surgical History:  Past Surgical History  Procedure Laterality Date  . Cardiac catheterization      stent placed  . External fixation leg Bilateral 08/24/2014    Procedure: EXTERNAL FIXATION LEG;  Surgeon: Kathryne Hitch, MD;  Location: [facility] OR;  Service: Orthopedics;  Laterality: Bilateral;  . Femur im nail Left 08/27/2014    Procedure: INTRAMEDULLARY (IM) NAIL FEMORAL;  Surgeon: Myrene Galas, MD;  Location: [facility] OR;  Service: Orthopedics;  Laterality: Left;  . Orif tibia plateau Left 08/27/2014    Procedure: OPEN REDUCTION INTERNAL FIXATION (ORIF) TIBIAL PLATEAU;  Surgeon: Myrene Galas, MD;  Location: [facility] OR;  Service: Orthopedics;  Laterality: Left;  . External fixation leg Right 08/27/2014    Procedure: EXTERNAL FIXATION LEG/ WITH I&D;  Surgeon: Myrene Galas, MD;  Location: [facility] OR;  Service: Orthopedics;  Laterality: Right;  and upper leg  . External fixation removal Right 08/29/2014    Procedure: REMOVAL EXTERNAL FIXATION LEG;  Surgeon: Myrene Galas, MD;  Location: [facility] OR;  Service: Orthopedics;  Laterality: Right;  . Orif femur fracture Right 08/29/2014    Procedure: OPEN REDUCTION INTERNAL FIXATION (ORIF) DISTAL FEMUR FRACTURE;  Surgeon: Myrene Galas, MD;  Location: [facility] OR;  Service: Orthopedics;  Laterality: Right;  . Tibia im nail insertion Right 08/29/2014    Procedure: INTRAMEDULLARY (IM) NAIL TIBIAL;  Surgeon: Myrene Galas, MD;  Location: [facility] OR;  Service:  Orthopedics;  Laterality: Right;  . I&d extremity Right 08/29/2014    Procedure: IRRIGATION AND DEBRIDEMENT RIGHT FOOT;  Surgeon: Myrene Galas, MD;  Location: [facility] OR;  Service: Orthopedics;  Laterality: Right;  . Quadriceps tendon repair Right 08/29/2014    Procedure: REPAIR QUADRICEP TENDON;  Surgeon: Myrene Galas, MD;  Location: [facility] OR;  Service: Orthopedics;  Laterality: Right;   HPI:  see TBI Team evaluation   Assessment / Plan / Recommendation Clinical Impression  Pt evaluated by TBI Team. Pt presents as a Rancho level IV (confuised/agitated), although performance at this time is also influenced by sedating medications. She was able to state her first name and inconsistently follows one-step commands when alert enough to focus her attention. Pt has attempts at communication, although speech is mostly unintelligible at this time, making it difficult to fully assess communication abilties. Will need continued differential daignosis. Pt currently is not appropriate for swallow evaluation given current mentation, level of alertness, and respiratory status (high RR at rest). SLP to follow for cognitive recovery and PO readiness. Recommend CIR.     SLP Assessment  Patient needs continued Speech Lanaguage Pathology Services    Follow Up Recommendations  Inpatient Rehab    Frequency and Duration min 3x week  2 weeks   Pertinent Vitals/Pain Pain Assessment: Faces Faces Pain Scale: Hurts even more Pain Location: legs/R ankle Pain Descriptors / Indicators: Grimacing;Guarding Pain Intervention(s): Limited activity within patient's tolerance;Monitored during session;Repositioned   SLP Goals  Patient/Family Stated Goal: unable to state Potential to Achieve  Goals (ACUTE ONLY): Good Potential Considerations (ACUTE ONLY): Severity of impairments;Ability to learn/carryover information  SLP Evaluation Prior Functioning  Cognitive/Linguistic Baseline: Information not available Type of Home:  House Available Help at Discharge: Family;Available 24 hours/day   Cognition  Overall Cognitive Status: Impaired/Different from baseline Arousal/Alertness: Suspect due to medications Orientation Level: Other (comment) (able to state her first name) Attention: Focused Focused Attention: Impaired Focused Attention Impairment: Verbal basic Problem Solving: Impaired Problem Solving Impairment: Verbal basic;Functional basic Behaviors: Restless;Physical agitation;Verbal agitation;Impulsive Safety/Judgment: Impaired Rancho Mirant Scales of Cognitive Functioning: Confused/agitated    Comprehension  Auditory Comprehension Overall Auditory Comprehension: Impaired Commands: Impaired One Step Basic Commands: 50-74% accurate Interfering Components: Attention;Pain    Expression     Oral / Motor      Maxcine Ham, M.A. CCC-SLP [phone]  Maxcine Ham 09/05/2014, 12:26 PM
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
## 2014-09-06 DIAGNOSIS — S82899B Other fracture of unspecified lower leg, initial encounter for open fracture type I or II: Secondary | ICD-10-CM

## 2014-09-06 DIAGNOSIS — S82142A Displaced bicondylar fracture of left tibia, initial encounter for closed fracture: Secondary | ICD-10-CM

## 2014-09-06 DIAGNOSIS — S069X4A Unspecified intracranial injury with loss of consciousness of 6 hours to 24 hours, initial encounter: Secondary | ICD-10-CM

## 2014-09-06 DIAGNOSIS — S7291XA Unspecified fracture of right femur, initial encounter for closed fracture: Secondary | ICD-10-CM

## 2014-09-06 LAB — CBC
HCT: 24.5 % — ABNORMAL LOW (ref 36.0–46.0)
Hemoglobin: 7.9 g/dL — ABNORMAL LOW (ref 12.0–15.0)
MCH: 29.8 pg (ref 26.0–34.0)
MCHC: 32.2 g/dL (ref 30.0–36.0)
MCV: 92.5 fL (ref 78.0–100.0)
Platelets: 395 10*3/uL (ref 150–400)
RBC: 2.65 MIL/uL — ABNORMAL LOW (ref 3.87–5.11)
RDW: 16.8 % — ABNORMAL HIGH (ref 11.5–15.5)
WBC: 11.1 10*3/uL — ABNORMAL HIGH (ref 4.0–10.5)

## 2014-09-06 LAB — GLUCOSE, CAPILLARY
Glucose-Capillary: 128 mg/dL — ABNORMAL HIGH (ref 65–99)
Glucose-Capillary: 130 mg/dL — ABNORMAL HIGH (ref 65–99)
Glucose-Capillary: 131 mg/dL — ABNORMAL HIGH (ref 65–99)
Glucose-Capillary: 132 mg/dL — ABNORMAL HIGH (ref 65–99)
Glucose-Capillary: 139 mg/dL — ABNORMAL HIGH (ref 65–99)
Glucose-Capillary: 142 mg/dL — ABNORMAL HIGH (ref 65–99)
Glucose-Capillary: 148 mg/dL — ABNORMAL HIGH (ref 65–99)

## 2014-09-06 LAB — BASIC METABOLIC PANEL
Anion gap: 6 (ref 5–15)
BUN: 12 mg/dL (ref 6–20)
CO2: 24 mmol/L (ref 22–32)
Calcium: 8.1 mg/dL — ABNORMAL LOW (ref 8.9–10.3)
Chloride: 108 mmol/L (ref 101–111)
Creatinine, Ser: 0.82 mg/dL (ref 0.44–1.00)
GFR calc Af Amer: >60 mL/min (ref >60)
GFR calc non Af Amer: >60 mL/min (ref >60)
Glucose, Bld: 153 mg/dL — ABNORMAL HIGH (ref 65–99)
Potassium: 3.9 mmol/L (ref 3.5–5.1)
Sodium: 138 mmol/L (ref 135–145)

## 2014-09-06 MED ORDER — QUETIAPINE FUMARATE 100 MG PO TABS
100.0000 mg | ORAL_TABLET | Freq: Three times a day (TID) | ORAL | Status: DC
  Administered 2014-09-06 – 2014-09-07 (×5): 100 mg
  Filled 2014-09-06 (×6): qty 1

## 2014-09-06 MED ORDER — INSULIN ASPART 100 UNIT/ML ~~LOC~~ SOLN
0.0000 [IU] | SUBCUTANEOUS | Status: DC
  Administered 2014-09-06 – 2014-09-08 (×14): 2 [IU] via SUBCUTANEOUS
  Administered 2014-09-09: 3 [IU] via SUBCUTANEOUS
  Administered 2014-09-09 – 2014-09-10 (×5): 2 [IU] via SUBCUTANEOUS
  Administered 2014-09-10: 3 [IU] via SUBCUTANEOUS
  Administered 2014-09-10 – 2014-09-11 (×4): 2 [IU] via SUBCUTANEOUS
  Administered 2014-09-11 (×2): 3 [IU] via SUBCUTANEOUS
  Administered 2014-09-11 – 2014-09-12 (×5): 2 [IU] via SUBCUTANEOUS
  Administered 2014-09-12: 3 [IU] via SUBCUTANEOUS
  Administered 2014-09-12: 2 [IU] via SUBCUTANEOUS
  Administered 2014-09-13: 3 [IU] via SUBCUTANEOUS
  Administered 2014-09-13 (×2): 2 [IU] via SUBCUTANEOUS
  Administered 2014-09-13: 3 [IU] via SUBCUTANEOUS
  Administered 2014-09-14 – 2014-09-16 (×2): 2 [IU] via SUBCUTANEOUS

## 2014-09-06 MED ORDER — FENTANYL 50 MCG/HR TD PT72
75.0000 ug | MEDICATED_PATCH | TRANSDERMAL | Status: DC
  Administered 2014-09-07 – 2014-09-13 (×3): 75 ug via TRANSDERMAL
  Filled 2014-09-06: qty 1
  Filled 2014-09-06 (×2): qty 3
  Filled 2014-09-06: qty 1
  Filled 2014-09-06: qty 3

## 2014-09-06 MED ORDER — METOPROLOL TARTRATE 25 MG/10 ML ORAL SUSPENSION
25.0000 mg | Freq: Two times a day (BID) | ORAL | Status: DC
  Administered 2014-09-06 – 2014-09-07 (×3): 25 mg
  Filled 2014-09-06 (×4): qty 10

## 2014-09-06 MED ORDER — VALPROIC ACID 250 MG/5ML PO SYRP
500.0000 mg | ORAL_SOLUTION | Freq: Two times a day (BID) | ORAL | Status: DC
  Administered 2014-09-06 – 2014-09-13 (×15): 500 mg via ORAL
  Filled 2014-09-06 (×17): qty 10

## 2014-09-06 MED ORDER — CLONAZEPAM 0.5 MG PO TABS
0.5000 mg | ORAL_TABLET | Freq: Three times a day (TID) | ORAL | Status: DC
  Administered 2014-09-06 – 2014-09-07 (×5): 0.5 mg
  Filled 2014-09-06 (×5): qty 1
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
## 2014-09-06 NOTE — Progress Notes (Signed)
Rehab admissions - I will be following pt's case and will check on pt's status on Monday. Rehab MD stated that pt will need to demonstrate an improved cognitive and activity tolerance before considering possible CIR.  Thanks.  Juliann Mule, PT Rehabilitation Admissions Coordinator [phone]

## 2014-09-06 NOTE — Progress Notes (Signed)
[facility]  Date of Admission:  08/24/2014  Antibiotics: Vancomycin day 6 Zosyn day 4  Subjective: Agitation, still on precedex  Objective: Temp:  [98.7 F (37.1 C)-99.2 F (37.3 C)] 99 F (37.2 C) (07/29 1217) Pulse Rate:  [70-146] 94 (07/29 1200) Resp:  [22-42] 37 (07/29 1200) BP: (94-136)/(39-70) 118/63 mmHg (07/29 1200) SpO2:  [98 %-100 %] 100 % (07/29 1200) Weight:  [195 lb 5.2 oz (88.6 kg)] 195 lb 5.2 oz (88.6 kg) (07/29 0500)  General: awake, eyes open, extubated Skin: no rashes Lungs: CTA B, anterior exam Cor: RRR Abdomen: soft, nt, nd Ext: no erythema or oozing noted around incisions Neuro: does not follow commands  Lab Results Lab Results  Component Value Date   WBC 11.1* 09/06/2014   HGB 7.9* 09/06/2014   HCT 24.5* 09/06/2014   MCV 92.5 09/06/2014   PLT 395 09/06/2014    Lab Results  Component Value Date   CREATININE 0.82 09/06/2014   BUN 12 09/06/2014   NA 138 09/06/2014   K 3.9 09/06/2014   CL 108 09/06/2014   CO2 24 09/06/2014    Lab Results  Component Value Date   ALT 59* 08/27/2014   AST 112* 08/27/2014   ALKPHOS 37* 08/27/2014   BILITOT 1.1 08/27/2014      Microbiology: Recent Results (from the past 240 hour(s))  Gram stain     Status: None   Collection Time: 08/29/14  9:22 AM  Result Value Ref Range Status   Specimen Description ABSCESS LEG RIGHT  Final   Special Requests NONE  Final   Gram Stain   Final    MODERATE WBC PRESENT,BOTH PMN AND MONONUCLEAR RARE GRAM VARIABLE ROD GRAM STAIN REVIEWED-AGREE WITH RESULT S YARBROUGH Gram Stain Report Called to,Read Back By and Verified With: J DIXON,RN AT 1037 08/29/14 BY K BARR    Report Status 08/29/2014 FINAL  Final  AFB culture with smear     Status: None (Preliminary result)   Collection Time: 08/29/14  9:22 AM  Result Value Ref Range Status   Specimen Description ABSCESS LEG RIGHT  Final   Special Requests NONE  Final   Acid Fast Smear   Final   NO ACID FAST BACILLI SEEN Performed at [facility]    Culture   Final    CULTURE WILL BE EXAMINED FOR 6 WEEKS BEFORE ISSUING A FINAL REPORT Performed at [facility]    Report Status PENDING  Incomplete  Anaerobic culture     Status: None   Collection Time: 08/29/14  9:22 AM  Result Value Ref Range Status   Specimen Description ABSCESS LEG RIGHT  Final   Special Requests NONE  Final   Gram Stain   Final    MODERATE WBC PRESENT,BOTH PMN AND MONONUCLEAR NO SQUAMOUS EPITHELIAL CELLS SEEN RARE GRAM VARIABLE ROD Performed at [facility]    Culture   Final    NO ANAEROBES ISOLATED Performed at [facility]    Report Status 09/03/2014 FINAL  Final  Culture, routine-abscess     Status: None   Collection Time: 08/29/14  9:22 AM  Result Value Ref Range Status   Specimen Description ABSCESS LEG RIGHT  Final   Special Requests NONE  Final   Gram Stain   Final    MODERATE WBC PRESENT,BOTH PMN AND MONONUCLEAR NO SQUAMOUS EPITHELIAL CELLS SEEN RARE GRAM VARIABLE ROD Gram Stain Report Called to,Read Back By and Verified With: Gram Stain Report  Called to,Read Back By and Verified With: Lilian Coma RN 10:37 08/29/14 BY Lucrezia Europe Performed at [facility] Performed at [facility]    Culture   Final    FEW BACILLUS SPECIES Note: Standardized susceptibility testing for this organism is not available. Performed at [facility]    Report Status 09/01/2014 FINAL  Final  Culture, respiratory (NON-Expectorated)     Status: None   Collection Time: 08/30/14 12:37 PM  Result Value Ref Range Status   Specimen Description TRACHEAL ASPIRATE  Final   Special Requests Normal  Final   Gram Stain   Final    FEW WBC PRESENT,BOTH PMN AND MONONUCLEAR RARE SQUAMOUS EPITHELIAL CELLS PRESENT FEW GRAM POSITIVE COCCI IN PAIRS FEW GRAM NEGATIVE RODS Performed at [facility]    Culture   Final    NORMAL OROPHARYNGEAL FLORA Performed at [facility]    Report Status 09/02/2014 FINAL  Final  Culture, blood (routine x 2)     Status: None   Collection Time: 08/30/14  2:20 PM  Result Value Ref Range Status   Specimen Description BLOOD RIGHT HAND  Final   Special Requests BOTTLES DRAWN AEROBIC AND ANAEROBIC 8CC  Final   Culture NO GROWTH 5 DAYS  Final   Report Status 09/04/2014 FINAL  Final  Culture, blood (routine x 2)     Status: None   Collection Time: 08/30/14  2:37 PM  Result Value Ref Range Status   Specimen Description BLOOD RIGHT HAND  Final   Special Requests BOTTLES DRAWN AEROBIC AND ANAEROBIC 8CC 8CC  Final   Culture NO GROWTH 5 DAYS  Final   Report Status 09/04/2014 FINAL  Final  Culture, blood (routine x 2)     Status: None (Preliminary result)   Collection Time: 09/03/14  8:50 AM  Result Value Ref Range Status   Specimen Description BLOOD RIGHT HAND  Final   Special Requests IN PEDIATRIC BOTTLE 3CC  Final   Culture NO GROWTH 2 DAYS  Final   Report Status PENDING  Incomplete  Culture, blood (routine x 2)     Status: None (Preliminary result)   Collection Time: 09/03/14  9:00 AM  Result Value Ref Range Status   Specimen Description BLOOD LEFT HAND  Final   Special Requests BOTTLES DRAWN AEROBIC ONLY 6CC  Final   Culture NO GROWTH 2 DAYS  Final   Report Status PENDING  Incomplete  Culture, Urine     Status: None   Collection Time: 09/03/14  9:25 AM  Result Value Ref Range Status   Specimen Description URINE, CATHETERIZED  Final   Special Requests Normal  Final   Culture NO GROWTH 1 DAY  Final   Report Status 09/04/2014 FINAL  Final  Culture, respiratory (NON-Expectorated)     Status: None   Collection Time: 09/03/14 10:00 AM  Result Value Ref Range Status   Specimen Description TRACHEAL ASPIRATE  Final   Special Requests Normal  Final   Gram Stain   Final    RARE WBC PRESENT,BOTH PMN AND MONONUCLEAR NO SQUAMOUS EPITHELIAL CELLS SEEN NO ORGANISMS SEEN Performed at [facility]    Culture    Final    NO GROWTH 2 DAYS Performed at [facility]    Report Status 09/05/2014 FINAL  Final    Studies/Results: Dg Abd Portable 1v  09/05/2014   CLINICAL DATA:  Status post feeding tube placement  EXAM: PORTABLE ABDOMEN - 1 VIEW  COMPARISON:  09/02/2014  FINDINGS: Scattered large and small bowel gas is noted. A feeding catheter is noted within the stomach. The previously seen nasogastric catheter has been removed.  IMPRESSION: Feeding catheter within the stomach.   Electronically Signed   By: Alcide Clever M.D.   On: 09/05/2014 10:29    Assessment/Plan:  1) bacillus wound infection - on vancomycin and sensitivities pending.  Vancomycin typically sensitive.  Would continue for about 7 days    2) fever - has remained afebrile and WBC improving on empiric zosyn with vancomyin.  Continue for now since it is improving, will monitor cultures.   Staci Righter, MD [email or web address] [phone] pager   [phone] cell 09/06/2014, 1:07 PM

## 2014-09-06 NOTE — Progress Notes (Signed)
Speech Language Pathology Treatment: Cognitive-Linquistic  Patient Details Name: Jakalyn Kratky MRN: [id number] DOB: March 31, 1965 Today's Date: 09/06/2014 Time: 4098-1191 SLP Time Calculation (min) (ACUTE ONLY): 25 min  Assessment / Plan / Recommendation Clinical Impression  Pt seen for cognitive treatment with PT. Precedex lifted for therapy, although pt with increased HR (160) and RR (45), requiring fentanyl for pain. HR subsequently dropped, however RR remained elevated with use of accessory muscles. SpO2 remained at 99-100%. Today she is inconsistently following commands when alert. No POs provided today due to level of alertness and respiratory status.    HPI Other Pertinent Information: see TBI Team evaluation   Pertinent Vitals Pain Assessment: Faces Faces Pain Scale: Hurts even more Pain Location: legs Pain Descriptors / Indicators: Grimacing Pain Intervention(s): Monitored during session;Repositioned  SLP Plan  Continue with current plan of care    Recommendations      Follow up Recommendations: Inpatient Rehab Plan: Continue with current plan of care    Maxcine Ham, M.A. CCC-SLP [phone]  Maxcine Ham 09/06/2014, 11:03 AM
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
## 2014-09-07 LAB — CBC
HCT: 25.9 % — ABNORMAL LOW (ref 36.0–46.0)
Hemoglobin: 8.2 g/dL — ABNORMAL LOW (ref 12.0–15.0)
MCH: 29.1 pg (ref 26.0–34.0)
MCHC: 31.7 g/dL (ref 30.0–36.0)
MCV: 91.8 fL (ref 78.0–100.0)
Platelets: 455 10*3/uL — ABNORMAL HIGH (ref 150–400)
RBC: 2.82 MIL/uL — ABNORMAL LOW (ref 3.87–5.11)
RDW: 17.5 % — ABNORMAL HIGH (ref 11.5–15.5)
WBC: 11.9 10*3/uL — ABNORMAL HIGH (ref 4.0–10.5)

## 2014-09-07 LAB — GLUCOSE, CAPILLARY
Glucose-Capillary: 133 mg/dL — ABNORMAL HIGH (ref 65–99)
Glucose-Capillary: 102 mg/dL — ABNORMAL HIGH (ref 65–99)
Glucose-Capillary: 139 mg/dL — ABNORMAL HIGH (ref 65–99)
Glucose-Capillary: 118 mg/dL — ABNORMAL HIGH (ref 65–99)
Glucose-Capillary: 143 mg/dL — ABNORMAL HIGH (ref 65–99)

## 2014-09-07 LAB — BASIC METABOLIC PANEL
Anion gap: 4 — ABNORMAL LOW (ref 5–15)
BUN: 13 mg/dL (ref 6–20)
CO2: 24 mmol/L (ref 22–32)
Calcium: 8.3 mg/dL — ABNORMAL LOW (ref 8.9–10.3)
Chloride: 111 mmol/L (ref 101–111)
Creatinine, Ser: 0.68 mg/dL (ref 0.44–1.00)
GFR calc Af Amer: >60 mL/min (ref >60)
GFR calc non Af Amer: >60 mL/min (ref >60)
Glucose, Bld: 154 mg/dL — ABNORMAL HIGH (ref 65–99)
Potassium: 4.2 mmol/L (ref 3.5–5.1)
Sodium: 139 mmol/L (ref 135–145)

## 2014-09-07 LAB — CLOSTRIDIUM DIFFICILE BY PCR: Toxigenic C. Difficile by PCR: NEGATIVE

## 2014-09-07 MED ORDER — POTASSIUM CHLORIDE 10 MEQ/50ML IV SOLN: 10.0000 meq | INTRAVENOUS | Status: DC

## 2014-09-07 MED ORDER — MIDAZOLAM HCL 2 MG/2ML IJ SOLN
4.0000 mg | Freq: Once | INTRAMUSCULAR | Status: AC
  Administered 2014-09-07: 4 mg via INTRAVENOUS
  Filled 2014-09-07: qty 4

## 2014-09-07 MED ORDER — METOPROLOL TARTRATE 50 MG PO TABS: 50.0000 mg | ORAL_TABLET | Freq: Two times a day (BID) | ORAL | Status: DC

## 2014-09-07 MED ORDER — ESCITALOPRAM OXALATE 20 MG PO TABS: 20.0000 mg | ORAL_TABLET | Freq: Every day | ORAL | Status: DC

## 2014-09-07 MED ORDER — QUETIAPINE FUMARATE 200 MG PO TABS
200.0000 mg | ORAL_TABLET | Freq: Three times a day (TID) | ORAL | Status: DC
  Administered 2014-09-07 – 2014-09-09 (×7): 200 mg
  Filled 2014-09-07 (×10): qty 1

## 2014-09-07 MED ORDER — IPRATROPIUM-ALBUTEROL 0.5-2.5 (3) MG/3ML IN SOLN: 3.0000 mL | RESPIRATORY_TRACT | Status: DC | PRN

## 2014-09-07 MED ORDER — CLONAZEPAM 1 MG PO TABS
1.0000 mg | ORAL_TABLET | Freq: Three times a day (TID) | ORAL | Status: DC
  Administered 2014-09-07 – 2014-09-09 (×7): 1 mg
  Filled 2014-09-07 (×7): qty 1

## 2014-09-07 MED ORDER — METOPROLOL TARTRATE 25 MG/10 ML ORAL SUSPENSION
50.0000 mg | Freq: Two times a day (BID) | ORAL | Status: DC
  Administered 2014-09-07 – 2014-09-13 (×13): 50 mg
  Filled 2014-09-07 (×15): qty 20

## 2014-09-07 MED ORDER — FENTANYL CITRATE (PF) 100 MCG/2ML IJ SOLN
50.0000 ug | INTRAMUSCULAR | Status: DC | PRN
  Administered 2014-09-07 – 2014-09-09 (×4): 100 ug via INTRAVENOUS
  Filled 2014-09-07 (×5): qty 2

## 2014-09-07 NOTE — Progress Notes (Signed)
[facility]  Date of Admission:  08/24/2014  Antibiotics: Vancomycin day 7 Zosyn day 5  Subjective: No acute events  Objective: Temp:  [97.6 F (36.4 C)-100.1 F (37.8 C)] 97.6 F (36.4 C) (07/30 0800) Pulse Rate:  [72-136] 80 (07/30 1100) Resp:  [28-40] 31 (07/30 1100) BP: (99-166)/(45-74) 113/52 mmHg (07/30 1100) SpO2:  [98 %-100 %] 100 % (07/30 1100) Weight:  [196 lb 3.4 oz (89 kg)] 196 lb 3.4 oz (89 kg) (07/30 0454)  General: awake, eyes open, extubated Skin: no rashes Lungs: CTA B, anterior exam Cor: RRR Abdomen: soft, nt, nd Ext: no erythema or oozing noted around incisions Neuro: does not follow commands  Lab Results Lab Results  Component Value Date   WBC 11.9* 09/07/2014   HGB 8.2* 09/07/2014   HCT 25.9* 09/07/2014   MCV 91.8 09/07/2014   PLT 455* 09/07/2014    Lab Results  Component Value Date   CREATININE 0.68 09/07/2014   BUN 13 09/07/2014   NA 139 09/07/2014   K 4.2 09/07/2014   CL 111 09/07/2014   CO2 24 09/07/2014    Lab Results  Component Value Date   ALT 59* 08/27/2014   AST 112* 08/27/2014   ALKPHOS 37* 08/27/2014   BILITOT 1.1 08/27/2014      Microbiology: Recent Results (from the past 240 hour(s))  Gram stain     Status: None   Collection Time: 08/29/14  9:22 AM  Result Value Ref Range Status   Specimen Description ABSCESS LEG RIGHT  Final   Special Requests NONE  Final   Gram Stain   Final    MODERATE WBC PRESENT,BOTH PMN AND MONONUCLEAR RARE GRAM VARIABLE ROD GRAM STAIN REVIEWED-AGREE WITH RESULT S YARBROUGH Gram Stain Report Called to,Read Back By and Verified With: J DIXON,RN AT 1037 08/29/14 BY K BARR    Report Status 08/29/2014 FINAL  Final  AFB culture with smear     Status: None (Preliminary result)   Collection Time: 08/29/14  9:22 AM  Result Value Ref Range Status   Specimen Description ABSCESS LEG RIGHT  Final   Special Requests NONE  Final   Acid Fast Smear   Final    NO ACID FAST  BACILLI SEEN Performed at [facility]    Culture   Final    CULTURE WILL BE EXAMINED FOR 6 WEEKS BEFORE ISSUING A FINAL REPORT Performed at [facility]    Report Status PENDING  Incomplete  Anaerobic culture     Status: None   Collection Time: 08/29/14  9:22 AM  Result Value Ref Range Status   Specimen Description ABSCESS LEG RIGHT  Final   Special Requests NONE  Final   Gram Stain   Final    MODERATE WBC PRESENT,BOTH PMN AND MONONUCLEAR NO SQUAMOUS EPITHELIAL CELLS SEEN RARE GRAM VARIABLE ROD Performed at [facility]    Culture   Final    NO ANAEROBES ISOLATED Performed at [facility]    Report Status 09/03/2014 FINAL  Final  Culture, routine-abscess     Status: None   Collection Time: 08/29/14  9:22 AM  Result Value Ref Range Status   Specimen Description ABSCESS LEG RIGHT  Final   Special Requests NONE  Final   Gram Stain   Final    MODERATE WBC PRESENT,BOTH PMN AND MONONUCLEAR NO SQUAMOUS EPITHELIAL CELLS SEEN RARE GRAM VARIABLE ROD Gram Stain Report Called to,Read Back By and Verified With: Gram Stain  Report Called to,Read Back By and Verified With: Lilian Coma RN 10:37 08/29/14 BY Lucrezia Europe Performed at [facility] Performed at [facility]    Culture   Final    FEW BACILLUS SPECIES Note: Standardized susceptibility testing for this organism is not available. Performed at [facility]    Report Status 09/01/2014 FINAL  Final  Culture, respiratory (NON-Expectorated)     Status: None   Collection Time: 08/30/14 12:37 PM  Result Value Ref Range Status   Specimen Description TRACHEAL ASPIRATE  Final   Special Requests Normal  Final   Gram Stain   Final    FEW WBC PRESENT,BOTH PMN AND MONONUCLEAR RARE SQUAMOUS EPITHELIAL CELLS PRESENT FEW GRAM POSITIVE COCCI IN PAIRS FEW GRAM NEGATIVE RODS Performed at [facility]    Culture   Final    NORMAL OROPHARYNGEAL FLORA Performed at [facility]     Report Status 09/02/2014 FINAL  Final  Culture, blood (routine x 2)     Status: None   Collection Time: 08/30/14  2:20 PM  Result Value Ref Range Status   Specimen Description BLOOD RIGHT HAND  Final   Special Requests BOTTLES DRAWN AEROBIC AND ANAEROBIC 8CC  Final   Culture NO GROWTH 5 DAYS  Final   Report Status 09/04/2014 FINAL  Final  Culture, blood (routine x 2)     Status: None   Collection Time: 08/30/14  2:37 PM  Result Value Ref Range Status   Specimen Description BLOOD RIGHT HAND  Final   Special Requests BOTTLES DRAWN AEROBIC AND ANAEROBIC 8CC 8CC  Final   Culture NO GROWTH 5 DAYS  Final   Report Status 09/04/2014 FINAL  Final  Culture, blood (routine x 2)     Status: None (Preliminary result)   Collection Time: 09/03/14  8:50 AM  Result Value Ref Range Status   Specimen Description BLOOD RIGHT HAND  Final   Special Requests IN PEDIATRIC BOTTLE 3CC  Final   Culture NO GROWTH 3 DAYS  Final   Report Status PENDING  Incomplete  Culture, blood (routine x 2)     Status: None (Preliminary result)   Collection Time: 09/03/14  9:00 AM  Result Value Ref Range Status   Specimen Description BLOOD LEFT HAND  Final   Special Requests BOTTLES DRAWN AEROBIC ONLY 6CC  Final   Culture NO GROWTH 3 DAYS  Final   Report Status PENDING  Incomplete  Culture, Urine     Status: None   Collection Time: 09/03/14  9:25 AM  Result Value Ref Range Status   Specimen Description URINE, CATHETERIZED  Final   Special Requests Normal  Final   Culture NO GROWTH 1 DAY  Final   Report Status 09/04/2014 FINAL  Final  Culture, respiratory (NON-Expectorated)     Status: None   Collection Time: 09/03/14 10:00 AM  Result Value Ref Range Status   Specimen Description TRACHEAL ASPIRATE  Final   Special Requests Normal  Final   Gram Stain   Final    RARE WBC PRESENT,BOTH PMN AND MONONUCLEAR NO SQUAMOUS EPITHELIAL CELLS SEEN NO ORGANISMS SEEN Performed at [facility]    Culture   Final    NO  GROWTH 2 DAYS Performed at [facility]    Report Status 09/05/2014 FINAL  Final    Studies/Results: No results found.  Assessment/Plan:  1) bacillus wound infection - on vancomycin and sensitivities pending.  Vancomycin typically sensitive.  Vancomycin now day  7/7, will stop tomorrow   2) fever - has remained afebrile and WBC improved with no source identified.  Cultures reviewed and negative.  Will stop pip/tazo and observe off it.  Not hypoxic, though breathing around 30.    Staci Righter, MD [facility] [email or web address] [phone] pager   [phone] cell 09/07/2014, 11:22 AM

## 2014-09-07 NOTE — Progress Notes (Signed)
Trauma Service Note  Subjective: Patient is not responding.  Breathing about 34.  Sats are okay.  Objective: Vital signs in last 24 hours: Temp:  [97.6 F (36.4 C)-100.1 F (37.8 C)] 97.6 F (36.4 C) (07/30 0800) Pulse Rate:  [72-136] 74 (07/30 0900) Resp:  [28-40] 29 (07/30 0900) BP: (99-166)/(45-74) 101/61 mmHg (07/30 0900) SpO2:  [98 %-100 %] 100 % (07/30 0900) Weight:  [89 kg (196 lb 3.4 oz)] 89 kg (196 lb 3.4 oz) (07/30 0454) Last BM Date: 09/06/14  Intake/Output from previous day: 07/29 0701 - 07/30 0700 In: 3345 [I.V.:2015; NG/GT:730; IV Piggyback:600] Out: 1605 [Urine:1605] Intake/Output this shift: Total I/O In: 250 [IV Piggyback:250] Out: 420 [Urine:350; Emesis/NG output:70]  General: No acute distress.  Gets agitated easily.  Lungs: Clear.  Abd: Benign and tolerating tube feedings through [facility] well  Extremities: Right LE is larger than Left LE.  Neuro: Irritable, agitated. Occasionally conversant.  Lab Results: CBC   Recent Labs  09/06/14 0530 09/07/14 0455  WBC 11.1* 11.9*  HGB 7.9* 8.2*  HCT 24.5* 25.9*  PLT 395 455*   BMET  Recent Labs  09/06/14 0530 09/07/14 0455  NA 138 139  K 3.9 4.2  CL 108 111  CO2 24 24  GLUCOSE 153* 154*  BUN 12 13  CREATININE 0.82 0.68  CALCIUM 8.1* 8.3*   PT/INR No results for input(s): LABPROT, INR in the last 72 hours. ABG No results for input(s): PHART, HCO3 in the last 72 hours.  Invalid input(s): PCO2, PO2  Studies/Results: Dg Abd Portable 1v  09/05/2014   CLINICAL DATA:  Status post feeding tube placement  EXAM: PORTABLE ABDOMEN - 1 VIEW  COMPARISON:  09/02/2014  FINDINGS: Scattered large and small bowel gas is noted. A feeding catheter is noted within the stomach. The previously seen nasogastric catheter has been removed.  IMPRESSION: Feeding catheter within the stomach.   Electronically Signed   By: Alcide Clever M.D.   On: 09/05/2014 10:29    Anti-infectives: Anti-infectives    Start      Dose/Rate Route Frequency Ordered Stop   09/05/14 2200  vancomycin (VANCOCIN) 1,250 mg in sodium chloride 0.9 % 250 mL IVPB     1,250 mg 166.7 mL/hr over 90 Minutes Intravenous Every 12 hours 09/05/14 1137     09/04/14 0000  vancomycin (VANCOCIN) IVPB 1000 mg/200 mL premix  Status:  Discontinued     1,000 mg 200 mL/hr over 60 Minutes Intravenous Every 8 hours 09/03/14 1513 09/05/14 1137   09/03/14 0900  piperacillin-tazobactam (ZOSYN) IVPB 3.375 g     3.375 g 12.5 mL/hr over 240 Minutes Intravenous 3 times per day 09/03/14 0822     09/01/14 1300  vancomycin (VANCOCIN) 1,250 mg in sodium chloride 0.9 % 250 mL IVPB  Status:  Discontinued     1,250 mg 166.7 mL/hr over 90 Minutes Intravenous Every 12 hours 09/01/14 1224 09/03/14 1513   08/29/14 2200  ceFAZolin (ANCEF) IVPB 2 g/50 mL premix  Status:  Discontinued    Comments:  Will adjust duration after wounds assessed in OR tomorrow   2 g 100 mL/hr over 30 Minutes Intravenous 3 times per day 08/29/14 1616 09/01/14 1149   08/29/14 1800  gentamicin (GARAMYCIN) 500 mg in dextrose 5 % 100 mL IVPB     500 mg 112.5 mL/hr over 60 Minutes Intravenous Every 24 hours 08/29/14 1700 08/31/14 1906   08/29/14 1430  gentamicin (GARAMYCIN) IVPB 80 mg     80 mg 100 mL/hr  over 30 Minutes Intravenous To Surgery 08/29/14 1420 08/29/14 1500   08/29/14 1344  tobramycin (NEBCIN) powder  Status:  Discontinued       As needed 08/29/14 1344 08/29/14 1556   08/29/14 1343  vancomycin (VANCOCIN) powder  Status:  Discontinued       As needed 08/29/14 1343 08/29/14 1556   08/26/14 1400  ceFAZolin (ANCEF) IVPB 2 g/50 mL premix  Status:  Discontinued    Comments:  Will adjust duration after wounds assessed in OR tomorrow   2 g 100 mL/hr over 30 Minutes Intravenous 3 times per day 08/26/14 1147 08/29/14 1616   08/24/14 1615  ceFAZolin (ANCEF) IVPB 2 g/50 mL premix  Status:  Discontinued     2 g 100 mL/hr over 30 Minutes Intravenous  Once 08/24/14 1602 08/26/14 1206       Assessment/Plan: s/p Procedure(s): REMOVAL EXTERNAL FIXATION LEG OPEN REDUCTION INTERNAL FIXATION (ORIF) DISTAL FEMUR FRACTURE INTRAMEDULLARY (IM) NAIL TIBIAL IRRIGATION AND DEBRIDEMENT RIGHT FOOT REPAIR QUADRICEP TENDON Transfer to SDU  LOS: 14 days   Marta Lamas. Gae Bon, MD, FACS [phone] Trauma Surgeon 09/07/2014

## 2014-09-08 LAB — CULTURE, BLOOD (ROUTINE X 2)
Culture: NO GROWTH
Culture: NO GROWTH

## 2014-09-08 LAB — GLUCOSE, CAPILLARY
Glucose-Capillary: 114 mg/dL — ABNORMAL HIGH (ref 65–99)
Glucose-Capillary: 130 mg/dL — ABNORMAL HIGH (ref 65–99)
Glucose-Capillary: 130 mg/dL — ABNORMAL HIGH (ref 65–99)
Glucose-Capillary: 132 mg/dL — ABNORMAL HIGH (ref 65–99)
Glucose-Capillary: 133 mg/dL — ABNORMAL HIGH (ref 65–99)
Glucose-Capillary: 139 mg/dL — ABNORMAL HIGH (ref 65–99)
Glucose-Capillary: 143 mg/dL — ABNORMAL HIGH (ref 65–99)

## 2014-09-08 MED ORDER — LOPERAMIDE HCL 1 MG/5ML PO LIQD
2.0000 mg | ORAL | Status: DC | PRN
  Administered 2014-09-08 (×2): 2 mg
  Filled 2014-09-08 (×3): qty 10

## 2014-09-08 NOTE — Progress Notes (Signed)
[facility]  Date of Admission:  08/24/2014  Antibiotics: Vancomycin day 8 Zosyn stopped  Subjective: No acute events  Objective: Temp:  [98.9 F (37.2 C)-100 F (37.8 C)] 100 F (37.8 C) (07/31 1200) Pulse Rate:  [69-145] 79 (07/31 1200) Resp:  [25-39] 37 (07/31 1200) BP: (96-173)/(47-115) 115/63 mmHg (07/31 1200) SpO2:  [98 %-100 %] 100 % (07/31 1200) Weight:  [195 lb 8.8 oz (88.7 kg)] 195 lb 8.8 oz (88.7 kg) (07/31 0453)  General: awake, eyes open, extubated Skin: no rashes   Lab Results Lab Results  Component Value Date   WBC 11.9* 09/07/2014   HGB 8.2* 09/07/2014   HCT 25.9* 09/07/2014   MCV 91.8 09/07/2014   PLT 455* 09/07/2014    Lab Results  Component Value Date   CREATININE 0.68 09/07/2014   BUN 13 09/07/2014   NA 139 09/07/2014   K 4.2 09/07/2014   CL 111 09/07/2014   CO2 24 09/07/2014    Lab Results  Component Value Date   ALT 59* 08/27/2014   AST 112* 08/27/2014   ALKPHOS 37* 08/27/2014   BILITOT 1.1 08/27/2014      Microbiology: Recent Results (from the past 240 hour(s))  Culture, respiratory (NON-Expectorated)     Status: None   Collection Time: 08/30/14 12:37 PM  Result Value Ref Range Status   Specimen Description TRACHEAL ASPIRATE  Final   Special Requests Normal  Final   Gram Stain   Final    FEW WBC PRESENT,BOTH PMN AND MONONUCLEAR RARE SQUAMOUS EPITHELIAL CELLS PRESENT FEW GRAM POSITIVE COCCI IN PAIRS FEW GRAM NEGATIVE RODS Performed at [facility]    Culture   Final    NORMAL OROPHARYNGEAL FLORA Performed at [facility]    Report Status 09/02/2014 FINAL  Final  Culture, blood (routine x 2)     Status: None   Collection Time: 08/30/14  2:20 PM  Result Value Ref Range Status   Specimen Description BLOOD RIGHT HAND  Final   Special Requests BOTTLES DRAWN AEROBIC AND ANAEROBIC 8CC  Final   Culture NO GROWTH 5 DAYS  Final   Report Status 09/04/2014 FINAL  Final  Culture, blood  (routine x 2)     Status: None   Collection Time: 08/30/14  2:37 PM  Result Value Ref Range Status   Specimen Description BLOOD RIGHT HAND  Final   Special Requests BOTTLES DRAWN AEROBIC AND ANAEROBIC 8CC 8CC  Final   Culture NO GROWTH 5 DAYS  Final   Report Status 09/04/2014 FINAL  Final  Culture, blood (routine x 2)     Status: None   Collection Time: 09/03/14  8:50 AM  Result Value Ref Range Status   Specimen Description BLOOD RIGHT HAND  Final   Special Requests IN PEDIATRIC BOTTLE 3CC  Final   Culture NO GROWTH 5 DAYS  Final   Report Status 09/08/2014 FINAL  Final  Culture, blood (routine x 2)     Status: None   Collection Time: 09/03/14  9:00 AM  Result Value Ref Range Status   Specimen Description BLOOD LEFT HAND  Final   Special Requests BOTTLES DRAWN AEROBIC ONLY 6CC  Final   Culture NO GROWTH 5 DAYS  Final   Report Status 09/08/2014 FINAL  Final  Culture, Urine     Status: None   Collection Time: 09/03/14  9:25 AM  Result Value Ref Range Status   Specimen Description URINE, CATHETERIZED  Final  Special Requests Normal  Final   Culture NO GROWTH 1 DAY  Final   Report Status 09/04/2014 FINAL  Final  Culture, respiratory (NON-Expectorated)     Status: None   Collection Time: 09/03/14 10:00 AM  Result Value Ref Range Status   Specimen Description TRACHEAL ASPIRATE  Final   Special Requests Normal  Final   Gram Stain   Final    RARE WBC PRESENT,BOTH PMN AND MONONUCLEAR NO SQUAMOUS EPITHELIAL CELLS SEEN NO ORGANISMS SEEN Performed at [facility]    Culture   Final    NO GROWTH 2 DAYS Performed at [facility]    Report Status 09/05/2014 FINAL  Final  Clostridium Difficile by PCR (not at [facility])     Status: None   Collection Time: 09/07/14  5:27 PM  Result Value Ref Range Status   C difficile by pcr NEGATIVE NEGATIVE Final    Studies/Results: No results found.  Assessment/Plan:  1) bacillus wound infection - on vancomycin. Vancomycin typically  sensitive.  Vancomycin now day 7/7 and have stopped.   2) fever - has remained afebrile and WBC improved (none today) with no source identified.  Cultures reviewed and negative.  C diff negative (on tube feeds).  Off antibiotics.    Will sign off, please call with any questions or concerns. thanks  Staci Righter, MD [facility] [email or web address] [phone] pager   [phone] cell 09/08/2014, 1:50 PM
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
## 2014-09-09 LAB — GLUCOSE, CAPILLARY
Glucose-Capillary: 152 mg/dL — ABNORMAL HIGH (ref 65–99)
Glucose-Capillary: 147 mg/dL — ABNORMAL HIGH (ref 65–99)
Glucose-Capillary: 122 mg/dL — ABNORMAL HIGH (ref 65–99)
Glucose-Capillary: 107 mg/dL — ABNORMAL HIGH (ref 65–99)
Glucose-Capillary: 115 mg/dL — ABNORMAL HIGH (ref 65–99)
Glucose-Capillary: 132 mg/dL — ABNORMAL HIGH (ref 65–99)

## 2014-09-09 NOTE — Progress Notes (Signed)
Pt transferred from 48M  With 2 RNs on monitor. Pt moved to bed, HR elevatted into 120's but now sustaining in the 110's. Other VSS. CHG bath done, will continue to monitor patient.
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
## 2014-09-09 NOTE — Evaluation (Signed)
Clinical/Bedside Swallow Evaluation Patient Details  Name: [name] MRN: [id number] Date of Birth: 1965-09-10  Today's Date: 09/09/2014 Time: SLP Start Time (ACUTE ONLY): 1542 SLP Stop Time (ACUTE ONLY): 1623 SLP Time Calculation (min) (ACUTE ONLY): 41 min  Past Medical History:  Past Medical History  Diagnosis Date  . Heart murmur   . Hypertension   . MI (myocardial infarction)     3 years ago    Past Surgical History:  Past Surgical History  Procedure Laterality Date  . Cardiac catheterization      stent placed  . External fixation leg Bilateral 08/24/2014    Procedure: EXTERNAL FIXATION LEG;  Surgeon: Kathryne Hitch, MD;  Location: [facility] OR;  Service: Orthopedics;  Laterality: Bilateral;  . Femur im nail Left 08/27/2014    Procedure: INTRAMEDULLARY (IM) NAIL FEMORAL;  Surgeon: Myrene Galas, MD;  Location: [facility] OR;  Service: Orthopedics;  Laterality: Left;  . Orif tibia plateau Left 08/27/2014    Procedure: OPEN REDUCTION INTERNAL FIXATION (ORIF) TIBIAL PLATEAU;  Surgeon: Myrene Galas, MD;  Location: [facility] OR;  Service: Orthopedics;  Laterality: Left;  . External fixation leg Right 08/27/2014    Procedure: EXTERNAL FIXATION LEG/ WITH I&D;  Surgeon: Myrene Galas, MD;  Location: [facility] OR;  Service: Orthopedics;  Laterality: Right;  and upper leg  . External fixation removal Right 08/29/2014    Procedure: REMOVAL EXTERNAL FIXATION LEG;  Surgeon: Myrene Galas, MD;  Location: [facility] OR;  Service: Orthopedics;  Laterality: Right;  . Orif femur fracture Right 08/29/2014    Procedure: OPEN REDUCTION INTERNAL FIXATION (ORIF) DISTAL FEMUR FRACTURE;  Surgeon: Myrene Galas, MD;  Location: [facility] OR;  Service: Orthopedics;  Laterality: Right;  . Tibia im nail insertion Right 08/29/2014    Procedure: INTRAMEDULLARY (IM) NAIL TIBIAL;  Surgeon: Myrene Galas, MD;  Location: [facility] OR;  Service: Orthopedics;  Laterality: Right;  . I&d extremity Right 08/29/2014    Procedure: IRRIGATION AND DEBRIDEMENT RIGHT  FOOT;  Surgeon: Myrene Galas, MD;  Location[facility] OR;  Service: Orthopedics;  Laterality: Right;  . Quadriceps tendon repair Right 08/29/2014    Procedure: REPAIR QUADRICEP TENDON;  Surgeon: Myrene Galas, MD;  Location: [facility] OR;  Service: Orthopedics;  Laterality: Right;   HPI:  see TBI Team evaluation   Assessment / Plan / Recommendation Clinical Impression  SLP administered ice chip trials with Mod cues for roal acceptance. Once in her oral cavity, pt initiates bolus prep with increased automaticity. Current mentation and respiratory status remain limiting factors to safe diet initiation. Will continue to follow for additional PO trials.    Aspiration Risk  Severe    Diet Recommendation NPO;Alternative means - temporary   Medication Administration: Via alternative means    Other  Recommendations Oral Care Recommendations: Oral care QID   Follow Up Recommendations  Inpatient Rehab    Frequency and Duration min 3x week  1 week   Pertinent Vitals/Pain Facial grimacing with BLE movement, monitored and repositioned    SLP Swallow Goals     Swallow Study Prior Functional Status  Type of Home: House Available Help at Discharge: Family;Available 24 hours/day    General Other Pertinent Information: see TBI Team evaluation Type of Study: Bedside swallow evaluation Previous Swallow Assessment: none in chart Diet Prior to this Study: NPO;Panda Temperature Spikes Noted: Yes (100) Respiratory Status: Room air History of Recent Intubation: Yes Length of Intubations (days): 10 days Date extubated: 09/03/14 Behavior/Cognition: Alert;Requires cueing;Distractible Oral Cavity - Dentition: Adequate natural dentition/normal for age Self-Feeding  Abilities: Needs assist Patient Positioning: Upright in chair/Tumbleform Baseline Vocal Quality: Low vocal intensity    Oral/Motor/Sensory Function     Ice Chips Ice chips: Impaired Presentation: Spoon Oral Phase Impairments: Poor awareness of  bolus   Thin Liquid Thin Liquid: Not tested    Nectar Thick Nectar Thick Liquid: Not tested   Honey Thick Honey Thick Liquid: Not tested   Puree Puree: Not tested   Solid    Solid: Not tested      Maxcine Ham, M.A. CCC-SLP [phone]  Maxcine Ham 09/09/2014,4:58 PM
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
## 2014-09-09 NOTE — Progress Notes (Signed)
Rehab admissions - I am following pt's case and observed pt this am. Pt was not able to engage in any discussion and had a blank stare. No family present. I then attempted to call pt's mother and left a voicemail.   I will continue to follow pt's status. Per rehab MD, pt will need to demonstrate improved cognition and activity tolerance/participation.  Thanks.  Juliann Mule, PT Rehabilitation Admissions Coordinator [phone]

## 2014-09-09 NOTE — Clinical Social Work Note (Signed)
Clinical Social Worker continuing to follow patient and family for support and discharge planning needs.  No family currently present at bedside.  Patient is not currently following commands but did speak her name for RN.  CSW has initiated SNF search in 50 mile radius of [place] due to possible insurance complications.  CSW to follow up with facilities and patient family to determine most appropriate venue in the event that patient is not able to go to inpatient rehab.  CSW remains available for support and to facilitate patient discharge needs once medically appropriate.  Macario Golds, [place] [phone]

## 2014-09-09 NOTE — Progress Notes (Signed)
Speech Language Pathology Treatment: Cognitive-Linquistic  Patient Details Name: [name] MRN: [id number] DOB: 1965/12/28 Today's Date: 09/09/2014 Time: 7829-5621 SLP Time Calculation (min) (ACUTE ONLY): 41 min  Assessment / Plan / Recommendation Clinical Impression  Pt seen for co-tx with OT. Pt became more alert once transferred to chair, attempting to communicate and inconsistently following one-step commands. Her voice is low in intensity which limits intelligibility, but she did clearly state her first and last name and birthday. She followed commands to smile, close her eyes, gives thumbs up, look to left visual field, and stick out her tongue. She showed brief sustained attention to mastication and oral prep of ice chips. Continue to recommend CIR.   HPI Other Pertinent Information: see TBI Team evaluation   Pertinent Vitals Pain Assessment: Faces Faces Pain Scale: Hurts even more Pain Location: with BLE movement Pain Descriptors / Indicators: Grimacing Pain Intervention(s): Limited activity within patient's tolerance;Monitored during session;Repositioned  SLP Plan  Continue with current plan of care    Recommendations      Oral Care Recommendations: Oral care QID Follow up Recommendations: Inpatient Rehab Plan: Continue with current plan of care     Maxcine Ham, M.A. CCC-SLP [phone]  Maxcine Ham 09/09/2014, 4:49 PM

## 2014-09-10 ENCOUNTER — Inpatient Hospital Stay (HOSPITAL_COMMUNITY): Payer: Medicaid Other

## 2014-09-10 LAB — GLUCOSE, CAPILLARY
Glucose-Capillary: 129 mg/dL — ABNORMAL HIGH (ref 65–99)
Glucose-Capillary: 138 mg/dL — ABNORMAL HIGH (ref 65–99)
Glucose-Capillary: 142 mg/dL — ABNORMAL HIGH (ref 65–99)
Glucose-Capillary: 146 mg/dL — ABNORMAL HIGH (ref 65–99)
Glucose-Capillary: 150 mg/dL — ABNORMAL HIGH (ref 65–99)
Glucose-Capillary: 156 mg/dL — ABNORMAL HIGH (ref 65–99)

## 2014-09-10 MED ORDER — CLONAZEPAM 1 MG PO TABS
2.0000 mg | ORAL_TABLET | Freq: Two times a day (BID) | ORAL | Status: DC
  Administered 2014-09-10 – 2014-09-11 (×3): 2 mg
  Filled 2014-09-10 (×3): qty 2

## 2014-09-10 MED ORDER — QUETIAPINE FUMARATE 400 MG PO TABS
400.0000 mg | ORAL_TABLET | Freq: Two times a day (BID) | ORAL | Status: DC
  Administered 2014-09-10 – 2014-09-12 (×6): 400 mg
  Filled 2014-09-10 (×8): qty 1
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
## 2014-09-10 NOTE — Progress Notes (Signed)
Rehab admissions - I am following pt's case and noted that today's therapy notes are recommending SNF. I checked on pt, who was asleep and did not waken her. Sitter present as pt was found on the floor earlier this am. Rn reports that family was here earlier in the day and that pt received Haldol for agitation earlier.  Per rehab MD, pt will need to demonstrate an improved cognitive state and be able to participate with therapy and up in chair 3 hours per day. Rehab MD also stated, "This may be an admission to reduced burden of care, much hinges on her cognitive recovery."  I called and left another voicemail with pt's mother. We need to clarify what level of assist that family can provide. At this time, we will continue to follow pt's status although SNF may need to be considered as a back up as well if pt can't participate in the intensity of CIR.  My partner, Genie, rehab admission coordinator, will follow up with pt tomorrow. Genie can be reached at [phone].   Thanks.  Juliann Mule, PT Rehabilitation Admissions Coordinator [phone]
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
## 2014-09-11 LAB — GLUCOSE, CAPILLARY
Glucose-Capillary: 148 mg/dL — ABNORMAL HIGH (ref 65–99)
Glucose-Capillary: 144 mg/dL — ABNORMAL HIGH (ref 65–99)
Glucose-Capillary: 159 mg/dL — ABNORMAL HIGH (ref 65–99)
Glucose-Capillary: 147 mg/dL — ABNORMAL HIGH (ref 65–99)
Glucose-Capillary: 141 mg/dL — ABNORMAL HIGH (ref 65–99)

## 2014-09-11 LAB — CBC
HCT: 29.9 % — ABNORMAL LOW (ref 36.0–46.0)
Hemoglobin: 9.2 g/dL — ABNORMAL LOW (ref 12.0–15.0)
MCH: 29.5 pg (ref 26.0–34.0)
MCHC: 30.8 g/dL (ref 30.0–36.0)
MCV: 95.8 fL (ref 78.0–100.0)
Platelets: 435 10*3/uL — ABNORMAL HIGH (ref 150–400)
RBC: 3.12 MIL/uL — ABNORMAL LOW (ref 3.87–5.11)
RDW: 18 % — ABNORMAL HIGH (ref 11.5–15.5)
WBC: 12.7 10*3/uL — ABNORMAL HIGH (ref 4.0–10.5)

## 2014-09-11 MED ORDER — CLONAZEPAM 1 MG PO TABS
1.0000 mg | ORAL_TABLET | Freq: Three times a day (TID) | ORAL | Status: DC
  Administered 2014-09-11: 1 mg
  Filled 2014-09-11: qty 1

## 2014-09-11 NOTE — Clinical Social Work Note (Signed)
Clinical Social Worker continuing to follow patient and family for support and discharge planning needs.  Patient was found on the floor yesterday and somnolent today.  Inpatient rehab admissions coordinator still working with family in attempts to make arrangements, however patient will need to participate with therapies and improve cognitively prior to admission.  Per chart, patient to potentially have a swallow evaluation tomorrow.  No family currently at bedside.  CSW remains available for support and will provide with bed offer when available.  Macario Golds, [place] [phone]

## 2014-09-11 NOTE — Progress Notes (Signed)
14 fr foley placed per MD order d/t acute urinary retention. Pt tolerated procedure well Osvaldo Human RN, present as second person during insertion. Peri care care with soap and water provided prior to insertion and sterile procedure maintained throughout. Will continue to monitor.
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
## 2014-09-12 ENCOUNTER — Inpatient Hospital Stay (HOSPITAL_COMMUNITY): Payer: Medicaid Other

## 2014-09-12 DIAGNOSIS — I469 Cardiac arrest, cause unspecified: Secondary | ICD-10-CM

## 2014-09-12 DIAGNOSIS — S82142A Displaced bicondylar fracture of left tibia, initial encounter for closed fracture: Secondary | ICD-10-CM | POA: Diagnosis present

## 2014-09-12 DIAGNOSIS — S82201A Unspecified fracture of shaft of right tibia, initial encounter for closed fracture: Secondary | ICD-10-CM | POA: Diagnosis present

## 2014-09-12 DIAGNOSIS — S82401A Unspecified fracture of shaft of right fibula, initial encounter for closed fracture: Secondary | ICD-10-CM

## 2014-09-12 DIAGNOSIS — S7291XA Unspecified fracture of right femur, initial encounter for closed fracture: Secondary | ICD-10-CM

## 2014-09-12 DIAGNOSIS — G931 Anoxic brain damage, not elsewhere classified: Secondary | ICD-10-CM

## 2014-09-12 DIAGNOSIS — F319 Bipolar disorder, unspecified: Secondary | ICD-10-CM | POA: Diagnosis present

## 2014-09-12 DIAGNOSIS — S7292XA Unspecified fracture of left femur, initial encounter for closed fracture: Secondary | ICD-10-CM | POA: Diagnosis present

## 2014-09-12 DIAGNOSIS — S92901B Unspecified fracture of right foot, initial encounter for open fracture: Secondary | ICD-10-CM | POA: Diagnosis present

## 2014-09-12 DIAGNOSIS — D62 Acute posthemorrhagic anemia: Secondary | ICD-10-CM | POA: Diagnosis not present

## 2014-09-12 HISTORY — DX: Unspecified fracture of right femur, initial encounter for closed fracture: S72.91XA

## 2014-09-12 HISTORY — DX: Cardiac arrest, cause unspecified: I46.9

## 2014-09-12 HISTORY — DX: Unspecified fracture of left femur, initial encounter for closed fracture: S72.92XA

## 2014-09-12 HISTORY — DX: Anoxic brain damage, not elsewhere classified: G93.1

## 2014-09-12 LAB — GLUCOSE, CAPILLARY
Glucose-Capillary: 109 mg/dL — ABNORMAL HIGH (ref 65–99)
Glucose-Capillary: 129 mg/dL — ABNORMAL HIGH (ref 65–99)
Glucose-Capillary: 136 mg/dL — ABNORMAL HIGH (ref 65–99)
Glucose-Capillary: 137 mg/dL — ABNORMAL HIGH (ref 65–99)
Glucose-Capillary: 154 mg/dL — ABNORMAL HIGH (ref 65–99)
Glucose-Capillary: 155 mg/dL — ABNORMAL HIGH (ref 65–99)

## 2014-09-12 MED ORDER — OXYCODONE HCL 5 MG/5ML PO SOLN
5.0000 mg | ORAL | Status: DC | PRN
  Administered 2014-09-13: 5 mg
  Administered 2014-09-15 – 2014-09-16 (×2): 15 mg
  Filled 2014-09-12: qty 15
  Filled 2014-09-12: qty 5
  Filled 2014-09-12: qty 15

## 2014-09-12 MED ORDER — WARFARIN - PHARMACIST DOSING INPATIENT
Freq: Every day | Status: DC
  Administered 2014-09-12 – 2014-09-13 (×2)

## 2014-09-12 MED ORDER — CLONAZEPAM 1 MG PO TABS
1.0000 mg | ORAL_TABLET | Freq: Two times a day (BID) | ORAL | Status: DC
  Administered 2014-09-12 (×2): 1 mg
  Filled 2014-09-12 (×2): qty 1

## 2014-09-12 MED ORDER — WARFARIN SODIUM 7.5 MG PO TABS
7.5000 mg | ORAL_TABLET | ORAL | Status: AC
  Administered 2014-09-12: 7.5 mg via ORAL
  Filled 2014-09-12: qty 1
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
## 2014-09-12 NOTE — Care Management Note (Signed)
Case Management Note  Patient Details  Name: Chivon Lepage MRN: [id number] Date of Birth: [birth year]  Subjective/Objective:                    Action/Plan: UR updated   Expected Discharge Date:            Expected Discharge Plan:  Skilled Nursing Facility  In-House Referral:  Clinical Social Work  Discharge planning Services  CM Consult  Post Acute Care Choice:    Choice offered to:     DME Arranged:    DME Agency:     HH Arranged:    HH Agency:     Status of Service:  In process, will continue to follow  Medicare Important Message Given:    Date Medicare IM Given:    Medicare IM give by:    Date Additional Medicare IM Given:    Additional Medicare Important Message give by:     If discussed at Long Length of Stay Meetings, dates discussed:  09-12-14   Additional Comments:  Kingsley Plan, RN 09/12/2014, 11:30 AM
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
## 2014-09-12 NOTE — Progress Notes (Signed)
Rehab admissions - I checked on pt and she was very lethargic, unable to engage in a conversation and her eyes were partially open. Sitter present. I spoke with therapists about pt's progress and therapy stated that pt received medication earlier this am and was not able to participate in therapies.  I called and left voicemails with pt's mother and son. I need to clarify and confirm what available family support there is.  At this point, pt is not ready for consideration of inpatient rehab. I will continue to follow her case and will hope to be in communication with family soon.  Thanks.  Juliann Mule, PT Rehabilitation Admissions Coordinator [phone]
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
## 2014-09-12 NOTE — Progress Notes (Signed)
Speech Language Pathology Treatment: Cognitive-Linquistic;Dysphagia  Patient Details Name: [name] MRN: [id number] DOB: [birth year] Today's Date: 09/12/2014 Time: 1100-1140 SLP Time Calculation (min) (ACUTE ONLY): 40 min  Assessment / Plan / Recommendation Clinical Impression  Pt function significantly impacted by sedating medications. Pt seen with PT/OT, sitting edge of bed. Max verbal and tactile cues needed to sustain arousal, in contrast to reports earlier today. Pt followed commands in less than 20% of opportunities, could not initiate or sustain attention to functional tasks. Verbalizations elicited with max cues were unintelligible. Minimal response to touch of puree to lips, suctioned from anterior oral cavity. Function consistent with a Rancho III (localized response), though likely pt is functioning at a Rancho IV or V when alert, based on report from MD and RN. Pt unlikely to progress to PO intake unless she can sustain arousal. Hopeful that MD will look at adjusting medications.    HPI  See TBI evaluation   Pertinent Vitals Pain Assessment: Faces Faces Pain Scale: Hurts little more  SLP Plan  Continue with current plan of care    Recommendations Medication Administration: Via alternative means              Oral Care Recommendations: Oral care QID Plan: Continue with current plan of care    GO    [facility], MA CCC-SLP [phone]  [name] 09/12/2014, 12:32 PM

## 2014-09-13 ENCOUNTER — Inpatient Hospital Stay (HOSPITAL_COMMUNITY): Payer: Medicaid Other

## 2014-09-13 LAB — GLUCOSE, CAPILLARY
Glucose-Capillary: 104 mg/dL — ABNORMAL HIGH (ref 65–99)
Glucose-Capillary: 125 mg/dL — ABNORMAL HIGH (ref 65–99)
Glucose-Capillary: 136 mg/dL — ABNORMAL HIGH (ref 65–99)
Glucose-Capillary: 141 mg/dL — ABNORMAL HIGH (ref 65–99)
Glucose-Capillary: 163 mg/dL — ABNORMAL HIGH (ref 65–99)
Glucose-Capillary: 184 mg/dL — ABNORMAL HIGH (ref 65–99)

## 2014-09-13 LAB — CBC
HCT: 29.7 % — ABNORMAL LOW (ref 36.0–46.0)
Hemoglobin: 9.1 g/dL — ABNORMAL LOW (ref 12.0–15.0)
MCH: 28.9 pg (ref 26.0–34.0)
MCHC: 30.6 g/dL (ref 30.0–36.0)
MCV: 94.3 fL (ref 78.0–100.0)
Platelets: 434 10*3/uL — ABNORMAL HIGH (ref 150–400)
RBC: 3.15 MIL/uL — ABNORMAL LOW (ref 3.87–5.11)
RDW: 17.6 % — ABNORMAL HIGH (ref 11.5–15.5)
WBC: 10.8 10*3/uL — ABNORMAL HIGH (ref 4.0–10.5)

## 2014-09-13 LAB — PROTIME-INR
INR: 1.16 (ref 0.00–1.49)
Prothrombin Time: 15 seconds (ref 11.6–15.2)

## 2014-09-13 MED ORDER — CLONAZEPAM 0.5 MG PO TABS
0.5000 mg | ORAL_TABLET | Freq: Two times a day (BID) | ORAL | Status: DC
  Administered 2014-09-13 – 2014-09-16 (×6): 0.5 mg
  Filled 2014-09-13 (×6): qty 1

## 2014-09-13 MED ORDER — QUETIAPINE FUMARATE 100 MG PO TABS
200.0000 mg | ORAL_TABLET | Freq: Two times a day (BID) | ORAL | Status: DC
  Administered 2014-09-13 – 2014-09-16 (×6): 200 mg
  Filled 2014-09-13: qty 1
  Filled 2014-09-13: qty 2
  Filled 2014-09-13 (×2): qty 1
  Filled 2014-09-13 (×3): qty 2

## 2014-09-13 MED ORDER — BETHANECHOL CHLORIDE 10 MG PO TABS
10.0000 mg | ORAL_TABLET | Freq: Three times a day (TID) | ORAL | Status: DC
  Administered 2014-09-13 – 2014-09-16 (×9): 10 mg via ORAL
  Filled 2014-09-13 (×13): qty 1

## 2014-09-13 MED ORDER — WARFARIN SODIUM 7.5 MG PO TABS
7.5000 mg | ORAL_TABLET | Freq: Once | ORAL | Status: AC
  Administered 2014-09-13: 7.5 mg via ORAL
  Filled 2014-09-13: qty 1
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
## 2014-09-13 NOTE — Clinical Social Work Note (Signed)
Clinical Social Worker continuing to follow patient and family for support and discharge planning needs. Inpatient rehab admissions coordinator still working with family in attempts to make arrangements, however patient will need to participate with therapies prior to admission. Per chart, patient has passed a swallow evaluation and has been started on a regular diet. No family currently at bedside.  Inpatient rehab admissions coordinator was able to communicate with patient mother regarding the potential for inpatient rehab placement and needs at discharge. CSW remains available for support and will provide with SNF bed offer when available.  Macario Golds, [place] [phone]

## 2014-09-13 NOTE — Progress Notes (Signed)
Rehab admissions - I met with pt this am who had just returned from swallow study. Pt was alert and able to engage in conversation with a hoarse voice. She was able to give baseline information and is willing to come to inpatient rehab. I shared initial information about our rehab TBI program and left brochures.  I then called and spoke with pt's mother. Mother is also in support of inpatient rehab but shared that she is on disability and cannot do any heavy lifting. Mother is able to provide needed supervision. Pt's adult son and 21 yo granddtr live with pt and son works part time as well as taking college classes. Mother would be the primary support. I explained to mother that pt would likely need minimal assistance to supervision. Mother was not sure if she could provide minimal assistance.   I will continue to follow pt's progress and we will see how she participates in therapies today and over the weekend. I will share an update on Monday.  Thanks.  Nanetta Batty, PT Rehabilitation Admissions Coordinator [phone]
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
## 2014-09-13 NOTE — Progress Notes (Signed)
Trauma Service Note  Subjective: Patient passed her swallowing examination.  Placed on regular diet.  She is babbling, but answering questions.  Objective: Vital signs in last 24 hours: Temp:  [97.5 F (36.4 C)-100 F (37.8 C)] 98.4 F (36.9 C) (08/05 0700) Pulse Rate:  [95-109] 107 (08/05 0802) Resp:  [22-27] 23 (08/05 0802) BP: (127-138)/(60-81) 137/69 mmHg (08/05 0802) SpO2:  [96 %-100 %] 100 % (08/05 0802) Weight:  [82.3 kg (181 lb 7 oz)] 82.3 kg (181 lb 7 oz) (08/05 0438) Last BM Date: 09/10/14  Intake/Output from previous day: 08/04 0701 - 08/05 0700 In: 2620 [I.V.:1875; NG/GT:745] Out: 2975 [Urine:2975] Intake/Output this shift: Total I/O In: -  Out: 450 [Urine:450]  General: No acute distress.  Soft, hoarse voice.  Lungs: Clear  Abd: Benign.  Has feeding tube in place.  Extremities: No changes  Neuro: Not oriented, but answering questions.   Lab Results: CBC   Recent Labs  09/11/14 0530 09/13/14 0540  WBC 12.7* 10.8*  HGB 9.2* 9.1*  HCT 29.9* 29.7*  PLT 435* 434*   BMET No results for input(s): NA, K, CL, CO2, GLUCOSE, BUN, CREATININE, CALCIUM in the last 72 hours. PT/INR  Recent Labs  09/13/14 0540  LABPROT 15.0  INR 1.16   ABG No results for input(s): PHART, HCO3 in the last 72 hours.  Invalid input(s): PCO2, PO2  Studies/Results: Dg Cerv Spine Flex&ext Only  09/12/2014   CLINICAL DATA:  Injury.  Neck pain.  EXAM: CERVICAL SPINE - FLEXION AND EXTENSION VIEWS ONLY  COMPARISON:  None.  FINDINGS: Soft tissue structures unremarkable. Nasogastric tube noted. Incomplete visualization of the lower cervical spine. No acute bony abnormality identified. No evidence of flexion or extension abnormality. Mild degenerative change. Full cervical spine series can be obtained for further evaluation .  IMPRESSION: No flexion or extension deformity. Incomplete visualization of the lower cervical spine.   Electronically Signed   By: Maisie Fus  Register   On:  09/12/2014 11:47   Dg Swallowing Func-speech Pathology  09/13/2014    Objective Swallowing Evaluation:    Patient Details  Name: [name] MRN: [id number] Date of Birth: November 26, 1965  Today's Date: 09/13/2014 Time: SLP Start Time (ACUTE ONLY): 0950-SLP Stop Time (ACUTE ONLY): 1015 SLP Time Calculation (min) (ACUTE ONLY): 25 min  Past Medical History:  Past Medical History  Diagnosis Date  . Heart murmur   . Hypertension   . MI (myocardial infarction)     3 years ago    Past Surgical History:  Past Surgical History  Procedure Laterality Date  . Cardiac catheterization      stent placed  . External fixation leg Bilateral 08/24/2014    Procedure: EXTERNAL FIXATION LEG;  Surgeon: Kathryne Hitch, MD;   Location: [facility] OR;  Service: Orthopedics;  Laterality: Bilateral;  . Femur im nail Left 08/27/2014    Procedure: INTRAMEDULLARY (IM) NAIL FEMORAL;  Surgeon: Myrene Galas,  MD;  Location: [facility] OR;  Service: Orthopedics;  Laterality: Left;  . Orif tibia plateau Left 08/27/2014    Procedure: OPEN REDUCTION INTERNAL FIXATION (ORIF) TIBIAL PLATEAU;   Surgeon: Myrene Galas, MD;  Location: [facility] OR;  Service: Orthopedics;   Laterality: Left;  . External fixation leg Right 08/27/2014    Procedure: EXTERNAL FIXATION LEG/ WITH I&D;  Surgeon: Myrene Galas, MD;   Location: [facility] OR;  Service: Orthopedics;  Laterality: Right;  and upper  leg  . External fixation removal Right 08/29/2014    Procedure: REMOVAL EXTERNAL FIXATION LEG;  Surgeon:  Myrene Galas, MD;   Location[facility] OR;  Service: Orthopedics;  Laterality: Right;  . Orif femur fracture Right 08/29/2014    Procedure: OPEN REDUCTION INTERNAL FIXATION (ORIF) DISTAL FEMUR  FRACTURE;  Surgeon: Myrene Galas, MD;  Location: [facility] OR;  Service:  Orthopedics;  Laterality: Right;  . Tibia im nail insertion Right 08/29/2014    Procedure: INTRAMEDULLARY (IM) NAIL TIBIAL;  Surgeon: Myrene Galas, MD;   Location: [facility] OR;  Service: Orthopedics;  Laterality: Right;  . I&d extremity Right 08/29/2014     Procedure: IRRIGATION AND DEBRIDEMENT RIGHT FOOT;  Surgeon: Myrene Galas, MD;  Location: [facility] OR;  Service: Orthopedics;  Laterality: Right;  . Quadriceps tendon repair Right 08/29/2014    Procedure: REPAIR QUADRICEP TENDON;  Surgeon: Myrene Galas, MD;   Location: [facility] OR;  Service: Orthopedics;  Laterality: Right;   HPI:  Other Pertinent Information: see TBI Team evaluation  No Data Recorded  Assessment / Plan / Recommendation CHL IP CLINICAL IMPRESSIONS 09/13/2014  Therapy Diagnosis Mild oral phase dysphagia  Clinical Impression Pt is fully alert and participatory during MBS,  sitting upright, NG tube in place. Presents with a mild oral dysphagia  with solids, slow mastication, decreased bolus cohesion, mild oral  residuals in bilateral buccal cavity. Oropharyngeal phase is WNL. No  penetration or aspiration observed, swallow is swift and strong. Despite  finding, pt consistently clears her throat and vocal quality it hoarse and  wet. This is unrelated to aspiration. Recommend pt consume a dys 2  (mechanical soft) diet and thin liquids with full supervision. Pt will  need assist for feeding. Pts arousal has not been consisent and swallow  function likely to decline with sedation. Recommend pt be fed only when  alert and NG tube remain in place until arousal is consistent and oral  nutrition is reliable. OK to d/c NG over the weekend if intake is good.       CHL IP TREATMENT RECOMMENDATION 09/13/2014  Treatment Recommendations Therapy as outlined in treatment plan below     CHL IP DIET RECOMMENDATION 09/13/2014  SLP Diet Recommendations Dysphagia 2 (Fine chop);Thin  Liquid Administration via (None)  Medication Administration Via alternative means  Compensations (None)  Postural Changes and/or Swallow Maneuvers (None)     CHL IP OTHER RECOMMENDATIONS 09/13/2014  Recommended Consults (None)  Oral Care Recommendations Oral care BID  Other Recommendations (None)     CHL IP FOLLOW UP RECOMMENDATIONS 09/13/2014  Follow up  Recommendations Inpatient Rehab     CHL IP FREQUENCY AND DURATION 09/13/2014  Speech Therapy Frequency (ACUTE ONLY) min 2x/week  Treatment Duration 2 weeks     Pertinent Vitals/Pain NA    SLP Swallow Goals No flowsheet data found.  No flowsheet data found.    CHL IP REASON FOR REFERRAL 09/13/2014  Reason for Referral Objectively evaluate swallowing function     CHL IP ORAL PHASE 09/13/2014  Lips (None)  Tongue (None)  Mucous membranes (None)  Nutritional status (None)  Other (None)  Oxygen therapy (None)  Oral Phase Impaired  Oral - Pudding Teaspoon (None)  Oral - Pudding Cup (None)  Oral - Honey Teaspoon (None)  Oral - Honey Cup (None)  Oral - Honey Syringe (None)  Oral - Nectar Teaspoon (None)  Oral - Nectar Cup (None)  Oral - Nectar Straw (None)  Oral - Nectar Syringe (None)  Oral - Ice Chips (None)  Oral - Thin Teaspoon (None)  Oral - Thin Cup (None)  Oral - Thin Straw (None)  Oral - Thin Syringe (None)  Oral - Puree (None)  Oral - Mechanical Soft (None)  Oral - Regular (None)  Oral - Multi-consistency (None)  Oral - Pill (None)  Oral Phase - Comment (None)      CHL IP PHARYNGEAL PHASE 09/13/2014  Pharyngeal Phase WFL  Pharyngeal - Pudding Teaspoon (None)  Penetration/Aspiration details (pudding teaspoon) (None)  Pharyngeal - Pudding Cup (None)  Penetration/Aspiration details (pudding cup) (None)  Pharyngeal - Honey Teaspoon (None)  Penetration/Aspiration details (honey teaspoon) (None)  Pharyngeal - Honey Cup (None)  Penetration/Aspiration details (honey cup) (None)  Pharyngeal - Honey Syringe (None)  Penetration/Aspiration details (honey syringe) (None)  Pharyngeal - Nectar Teaspoon (None)  Penetration/Aspiration details (nectar teaspoon) (None)  Pharyngeal - Nectar Cup (None)  Penetration/Aspiration details (nectar cup) (None)  Pharyngeal - Nectar Straw (None)  Penetration/Aspiration details (nectar straw) (None)  Pharyngeal - Nectar Syringe (None)  Penetration/Aspiration details (nectar syringe) (None)  Pharyngeal -  Ice Chips (None)  Penetration/Aspiration details (ice chips) (None)  Pharyngeal - Thin Teaspoon (None)  Penetration/Aspiration details (thin teaspoon) (None)  Pharyngeal - Thin Cup (None)  Penetration/Aspiration details (thin cup) (None)  Pharyngeal - Thin Straw (None)  Penetration/Aspiration details (thin straw) (None)  Pharyngeal - Thin Syringe (None)  Penetration/Aspiration details (thin syringe') (None)  Pharyngeal - Puree (None)  Penetration/Aspiration details (puree) (None)  Pharyngeal - Mechanical Soft (None)  Penetration/Aspiration details (mechanical soft) (None)  Pharyngeal - Regular (None)  Penetration/Aspiration details (regular) (None)  Pharyngeal - Multi-consistency (None)  Penetration/Aspiration details (multi-consistency) (None)  Pharyngeal - Pill (None)  Penetration/Aspiration details (pill) (None)  Pharyngeal Comment (None)      CHL IP CERVICAL ESOPHAGEAL PHASE 09/13/2014  Cervical Esophageal Phase WFL  Pudding Teaspoon (None)  Pudding Cup (None)  Honey Teaspoon (None)  Honey Cup (None)  Honey Straw (None)  Nectar Teaspoon (None)  Nectar Cup (None)  Nectar Straw (None)  Nectar Sippy Cup (None)  Thin Teaspoon (None)  Thin Cup (None)  Thin Straw (None)  Thin Sippy Cup (None)  Cervical Esophageal Comment (None)    No flowsheet data found.        Harlon Ditty, [place] CCC-SLP [phone]  [name] 09/13/2014, 10:41 AM     Anti-infectives: Anti-infectives    Start     Dose/Rate Route Frequency Ordered Stop   09/05/14 2200  vancomycin (VANCOCIN) 1,250 mg in sodium chloride 0.9 % 250 mL IVPB     1,250 mg 166.7 mL/hr over 90 Minutes Intravenous Every 12 hours 09/05/14 1137 09/08/14 1132   09/04/14 0000  vancomycin (VANCOCIN) IVPB 1000 mg/200 mL premix  Status:  Discontinued     1,000 mg 200 mL/hr over 60 Minutes Intravenous Every 8 hours 09/03/14 1513 09/05/14 1137   09/03/14 0900  piperacillin-tazobactam (ZOSYN) IVPB 3.375 g  Status:  Discontinued     3.375 g 12.5 mL/hr over 240  Minutes Intravenous 3 times per day 09/03/14 0822 09/07/14 1128   09/01/14 1300  vancomycin (VANCOCIN) 1,250 mg in sodium chloride 0.9 % 250 mL IVPB  Status:  Discontinued     1,250 mg 166.7 mL/hr over 90 Minutes Intravenous Every 12 hours 09/01/14 1224 09/03/14 1513   08/29/14 2200  ceFAZolin (ANCEF) IVPB 2 g/50 mL premix  Status:  Discontinued    Comments:  Will adjust duration after wounds assessed in OR tomorrow   2 g 100 mL/hr over 30 Minutes Intravenous 3 times per day 08/29/14 1616 09/01/14 1149   08/29/14 1800  gentamicin (GARAMYCIN) 500 mg in  dextrose 5 % 100 mL IVPB     500 mg 112.5 mL/hr over 60 Minutes Intravenous Every 24 hours 08/29/14 1700 08/31/14 1906   08/29/14 1430  gentamicin (GARAMYCIN) IVPB 80 mg     80 mg 100 mL/hr over 30 Minutes Intravenous To Surgery 08/29/14 1420 08/29/14 1500   08/29/14 1344  tobramycin (NEBCIN) powder  Status:  Discontinued       As needed 08/29/14 1344 08/29/14 1556   08/29/14 1343  vancomycin (VANCOCIN) powder  Status:  Discontinued       As needed 08/29/14 1343 08/29/14 1556   08/26/14 1400  ceFAZolin (ANCEF) IVPB 2 g/50 mL premix  Status:  Discontinued    Comments:  Will adjust duration after wounds assessed in OR tomorrow   2 g 100 mL/hr over 30 Minutes Intravenous 3 times per day 08/26/14 1147 08/29/14 1616   08/24/14 1615  ceFAZolin (ANCEF) IVPB 2 g/50 mL premix  Status:  Discontinued     2 g 100 mL/hr over 30 Minutes Intravenous  Once 08/24/14 1602 08/26/14 1206      Assessment/Plan: s/p Procedure(s): REMOVAL EXTERNAL FIXATION LEG OPEN REDUCTION INTERNAL FIXATION (ORIF) DISTAL FEMUR FRACTURE INTRAMEDULLARY (IM) NAIL TIBIAL IRRIGATION AND DEBRIDEMENT RIGHT FOOT REPAIR QUADRICEP TENDON Advance diet Possible transfer to the floor later today.  LOS: 20 days   Marta Lamas. Gae Bon, MD, FACS [phone] Trauma Surgeon 09/13/2014

## 2014-09-14 LAB — GLUCOSE, CAPILLARY
Glucose-Capillary: 105 mg/dL — ABNORMAL HIGH (ref 65–99)
Glucose-Capillary: 104 mg/dL — ABNORMAL HIGH (ref 65–99)
Glucose-Capillary: 93 mg/dL (ref 65–99)
Glucose-Capillary: 133 mg/dL — ABNORMAL HIGH (ref 65–99)
Glucose-Capillary: 87 mg/dL (ref 65–99)
Glucose-Capillary: 102 mg/dL — ABNORMAL HIGH (ref 65–99)

## 2014-09-14 LAB — CBC
HCT: 30.4 % — ABNORMAL LOW (ref 36.0–46.0)
Hemoglobin: 9.3 g/dL — ABNORMAL LOW (ref 12.0–15.0)
MCH: 28.6 pg (ref 26.0–34.0)
MCHC: 30.6 g/dL (ref 30.0–36.0)
MCV: 93.5 fL (ref 78.0–100.0)
Platelets: 357 10*3/uL (ref 150–400)
RBC: 3.25 MIL/uL — ABNORMAL LOW (ref 3.87–5.11)
RDW: 17.2 % — ABNORMAL HIGH (ref 11.5–15.5)
WBC: 9.1 10*3/uL (ref 4.0–10.5)

## 2014-09-14 LAB — PROTIME-INR
INR: 1.23 (ref 0.00–1.49)
Prothrombin Time: 15.6 seconds — ABNORMAL HIGH (ref 11.6–15.2)

## 2014-09-14 MED ORDER — ADULT MULTIVITAMIN W/MINERALS CH
1.0000 | ORAL_TABLET | Freq: Every day | ORAL | Status: DC
  Administered 2014-09-14 – 2014-09-16 (×3): 1 via ORAL
  Filled 2014-09-14 (×4): qty 1

## 2014-09-14 MED ORDER — METOPROLOL TARTRATE 50 MG PO TABS
50.0000 mg | ORAL_TABLET | Freq: Two times a day (BID) | ORAL | Status: DC
  Administered 2014-09-14 – 2014-09-16 (×5): 50 mg via ORAL
  Filled 2014-09-14 (×6): qty 1

## 2014-09-14 MED ORDER — WARFARIN SODIUM 5 MG PO TABS
7.5000 mg | ORAL_TABLET | Freq: Once | ORAL | Status: AC
  Administered 2014-09-14: 7.5 mg via ORAL
  Filled 2014-09-14: qty 2
  Filled 2014-09-14: qty 1

## 2014-09-14 MED ORDER — VALPROIC ACID 250 MG PO CAPS
500.0000 mg | ORAL_CAPSULE | Freq: Two times a day (BID) | ORAL | Status: DC
  Administered 2014-09-14 – 2014-09-16 (×5): 500 mg via ORAL
  Filled 2014-09-14 (×8): qty 2

## 2014-09-14 NOTE — Progress Notes (Signed)
Trauma Service Note  Subjective: Slept well, no complaints overnight. Tolerating food  Objective: Vital signs in last 24 hours: Temp:  [97.6 F (36.4 C)-98.5 F (36.9 C)] 98.5 F (36.9 C) (08/06 0700) Pulse Rate:  [76-98] 78 (08/06 0740) Resp:  [13-23] 17 (08/06 0740) BP: (116-168)/(63-74) 116/64 mmHg (08/06 0740) SpO2:  [98 %-100 %] 100 % (08/06 0740) Weight:  [80.5 kg (177 lb 7.5 oz)] 80.5 kg (177 lb 7.5 oz) (08/06 0441) Last BM Date: 09/13/14  Intake/Output from previous day: 08/05 0701 - 08/06 0700 In: 1011.3 [I.V.:1011.3] Out: 2700 [Urine:2700] Intake/Output this shift: Total I/O In: 100 [I.V.:100] Out: 300 [Urine:300]  General: NAD, alert  Lungs: CTAB  Abd: soft, NT, ND  Extremities: multiple surgical incisions C/D/I  Neuro: GCS 15 AOx3  Lab Results: CBC   Recent Labs  09/13/14 0540 09/14/14 0630  WBC 10.8* 9.1  HGB 9.1* 9.3*  HCT 29.7* 30.4*  PLT 434* 357   BMET No results for input(s): NA, K, CL, CO2, GLUCOSE, BUN, CREATININE, CALCIUM in the last 72 hours. PT/INR  Recent Labs  09/13/14 0540 09/14/14 0630  LABPROT 15.0 15.6*  INR 1.16 1.23   ABG No results for input(s): PHART, HCO3 in the last 72 hours.  Invalid input(s): PCO2, PO2  Studies/Results: Dg Swallowing Func-speech Pathology  09/13/2014    Objective Swallowing Evaluation:    Patient Details  Name: [name] MRN: [id number] Date of Birth: October 22, 1965  Today's Date: 09/13/2014 Time: SLP Start Time (ACUTE ONLY): 0950-SLP Stop Time (ACUTE ONLY): 1015 SLP Time Calculation (min) (ACUTE ONLY): 25 min  Past Medical History:  Past Medical History  Diagnosis Date  . Heart murmur   . Hypertension   . MI (myocardial infarction)     3 years ago    Past Surgical History:  Past Surgical History  Procedure Laterality Date  . Cardiac catheterization      stent placed  . External fixation leg Bilateral 08/24/2014    Procedure: EXTERNAL FIXATION LEG;  Surgeon: Kathryne Hitch, MD;   Location: [facility] OR;   Service: Orthopedics;  Laterality: Bilateral;  . Femur im nail Left 08/27/2014    Procedure: INTRAMEDULLARY (IM) NAIL FEMORAL;  Surgeon: Myrene Galas,  MD;  Location: [facility] OR;  Service: Orthopedics;  Laterality: Left;  . Orif tibia plateau Left 08/27/2014    Procedure: OPEN REDUCTION INTERNAL FIXATION (ORIF) TIBIAL PLATEAU;   Surgeon: Myrene Galas, MD;  Location: [facility] OR;  Service: Orthopedics;   Laterality: Left;  . External fixation leg Right 08/27/2014    Procedure: EXTERNAL FIXATION LEG/ WITH I&D;  Surgeon: Myrene Galas, MD;   Location: [facility] OR;  Service: Orthopedics;  Laterality: Right;  and upper  leg  . External fixation removal Right 08/29/2014    Procedure: REMOVAL EXTERNAL FIXATION LEG;  Surgeon: Myrene Galas, MD;   Location: [facility] OR;  Service: Orthopedics;  Laterality: Right;  . Orif femur fracture Right 08/29/2014    Procedure: OPEN REDUCTION INTERNAL FIXATION (ORIF) DISTAL FEMUR  FRACTURE;  Surgeon: Myrene Galas, MD;  Location: [facility] OR;  Service:  Orthopedics;  Laterality: Right;  . Tibia im nail insertion Right 08/29/2014    Procedure: INTRAMEDULLARY (IM) NAIL TIBIAL;  Surgeon: Myrene Galas, MD;   Location: [facility] OR;  Service: Orthopedics;  Laterality: Right;  . I&d extremity Right 08/29/2014    Procedure: IRRIGATION AND DEBRIDEMENT RIGHT FOOT;  Surgeon: Myrene Galas, MD;  Location: [facility] OR;  Service: Orthopedics;  Laterality: Right;  . Quadriceps tendon repair Right 08/29/2014  Procedure: REPAIR QUADRICEP TENDON;  Surgeon: Myrene Galas, MD;   Location: [facility] OR;  Service: Orthopedics;  Laterality: Right;   HPI:  Other Pertinent Information: see TBI Team evaluation  No Data Recorded  Assessment / Plan / Recommendation CHL IP CLINICAL IMPRESSIONS 09/13/2014  Therapy Diagnosis Mild oral phase dysphagia  Clinical Impression Pt is fully alert and participatory during MBS,  sitting upright, NG tube in place. Presents with a mild oral dysphagia  with solids, slow mastication, decreased bolus cohesion, mild oral  residuals  in bilateral buccal cavity. Oropharyngeal phase is WNL. No  penetration or aspiration observed, swallow is swift and strong. Despite  finding, pt consistently clears her throat and vocal quality it hoarse and  wet. This is unrelated to aspiration. Recommend pt consume a dys 2  (mechanical soft) diet and thin liquids with full supervision. Pt will  need assist for feeding. Pts arousal has not been consisent and swallow  function likely to decline with sedation. Recommend pt be fed only when  alert and NG tube remain in place until arousal is consistent and oral  nutrition is reliable. OK to d/c NG over the weekend if intake is good.       CHL IP TREATMENT RECOMMENDATION 09/13/2014  Treatment Recommendations Therapy as outlined in treatment plan below     CHL IP DIET RECOMMENDATION 09/13/2014  SLP Diet Recommendations Dysphagia 2 (Fine chop);Thin  Liquid Administration via (None)  Medication Administration Via alternative means  Compensations (None)  Postural Changes and/or Swallow Maneuvers (None)     CHL IP OTHER RECOMMENDATIONS 09/13/2014  Recommended Consults (None)  Oral Care Recommendations Oral care BID  Other Recommendations (None)     CHL IP FOLLOW UP RECOMMENDATIONS 09/13/2014  Follow up Recommendations Inpatient Rehab     CHL IP FREQUENCY AND DURATION 09/13/2014  Speech Therapy Frequency (ACUTE ONLY) min 2x/week  Treatment Duration 2 weeks     Pertinent Vitals/Pain NA    SLP Swallow Goals No flowsheet data found.  No flowsheet data found.    CHL IP REASON FOR REFERRAL 09/13/2014  Reason for Referral Objectively evaluate swallowing function     CHL IP ORAL PHASE 09/13/2014  Lips (None)  Tongue (None)  Mucous membranes (None)  Nutritional status (None)  Other (None)  Oxygen therapy (None)  Oral Phase Impaired  Oral - Pudding Teaspoon (None)  Oral - Pudding Cup (None)  Oral - Honey Teaspoon (None)  Oral - Honey Cup (None)  Oral - Honey Syringe (None)  Oral - Nectar Teaspoon (None)  Oral - Nectar Cup (None)  Oral - Nectar  Straw (None)  Oral - Nectar Syringe (None)  Oral - Ice Chips (None)  Oral - Thin Teaspoon (None)  Oral - Thin Cup (None)  Oral - Thin Straw (None)  Oral - Thin Syringe (None)  Oral - Puree (None)  Oral - Mechanical Soft (None)  Oral - Regular (None)  Oral - Multi-consistency (None)  Oral - Pill (None)  Oral Phase - Comment (None)      CHL IP PHARYNGEAL PHASE 09/13/2014  Pharyngeal Phase WFL  Pharyngeal - Pudding Teaspoon (None)  Penetration/Aspiration details (pudding teaspoon) (None)  Pharyngeal - Pudding Cup (None)  Penetration/Aspiration details (pudding cup) (None)  Pharyngeal - Honey Teaspoon (None)  Penetration/Aspiration details (honey teaspoon) (None)  Pharyngeal - Honey Cup (None)  Penetration/Aspiration details (honey cup) (None)  Pharyngeal - Honey Syringe (None)  Penetration/Aspiration details (honey syringe) (None)  Pharyngeal - Nectar Teaspoon (None)  Penetration/Aspiration details (nectar teaspoon) (  None)  Pharyngeal - Nectar Cup (None)  Penetration/Aspiration details (nectar cup) (None)  Pharyngeal - Nectar Straw (None)  Penetration/Aspiration details (nectar straw) (None)  Pharyngeal - Nectar Syringe (None)  Penetration/Aspiration details (nectar syringe) (None)  Pharyngeal - Ice Chips (None)  Penetration/Aspiration details (ice chips) (None)  Pharyngeal - Thin Teaspoon (None)  Penetration/Aspiration details (thin teaspoon) (None)  Pharyngeal - Thin Cup (None)  Penetration/Aspiration details (thin cup) (None)  Pharyngeal - Thin Straw (None)  Penetration/Aspiration details (thin straw) (None)  Pharyngeal - Thin Syringe (None)  Penetration/Aspiration details (thin syringe') (None)  Pharyngeal - Puree (None)  Penetration/Aspiration details (puree) (None)  Pharyngeal - Mechanical Soft (None)  Penetration/Aspiration details (mechanical soft) (None)  Pharyngeal - Regular (None)  Penetration/Aspiration details (regular) (None)  Pharyngeal - Multi-consistency (None)  Penetration/Aspiration details  (multi-consistency) (None)  Pharyngeal - Pill (None)  Penetration/Aspiration details (pill) (None)  Pharyngeal Comment (None)      CHL IP CERVICAL ESOPHAGEAL PHASE 09/13/2014  Cervical Esophageal Phase WFL  Pudding Teaspoon (None)  Pudding Cup (None)  Honey Teaspoon (None)  Honey Cup (None)  Honey Straw (None)  Nectar Teaspoon (None)  Nectar Cup (None)  Nectar Straw (None)  Nectar Sippy Cup (None)  Thin Teaspoon (None)  Thin Cup (None)  Thin Straw (None)  Thin Sippy Cup (None)  Cervical Esophageal Comment (None)    No flowsheet data found.        Harlon Ditty, [place] CCC-SLP [phone]  [name] 09/13/2014, 10:41 AM     Anti-infectives: Anti-infectives    Start     Dose/Rate Route Frequency Ordered Stop   09/05/14 2200  vancomycin (VANCOCIN) 1,250 mg in sodium chloride 0.9 % 250 mL IVPB     1,250 mg 166.7 mL/hr over 90 Minutes Intravenous Every 12 hours 09/05/14 1137 09/08/14 1132   09/04/14 0000  vancomycin (VANCOCIN) IVPB 1000 mg/200 mL premix  Status:  Discontinued     1,000 mg 200 mL/hr over 60 Minutes Intravenous Every 8 hours 09/03/14 1513 09/05/14 1137   09/03/14 0900  piperacillin-tazobactam (ZOSYN) IVPB 3.375 g  Status:  Discontinued     3.375 g 12.5 mL/hr over 240 Minutes Intravenous 3 times per day 09/03/14 0822 09/07/14 1128   09/01/14 1300  vancomycin (VANCOCIN) 1,250 mg in sodium chloride 0.9 % 250 mL IVPB  Status:  Discontinued     1,250 mg 166.7 mL/hr over 90 Minutes Intravenous Every 12 hours 09/01/14 1224 09/03/14 1513   08/29/14 2200  ceFAZolin (ANCEF) IVPB 2 g/50 mL premix  Status:  Discontinued    Comments:  Will adjust duration after wounds assessed in OR tomorrow   2 g 100 mL/hr over 30 Minutes Intravenous 3 times per day 08/29/14 1616 09/01/14 1149   08/29/14 1800  gentamicin (GARAMYCIN) 500 mg in dextrose 5 % 100 mL IVPB     500 mg 112.5 mL/hr over 60 Minutes Intravenous Every 24 hours 08/29/14 1700 08/31/14 1906   08/29/14 1430  gentamicin (GARAMYCIN) IVPB  80 mg     80 mg 100 mL/hr over 30 Minutes Intravenous To Surgery 08/29/14 1420 08/29/14 1500   08/29/14 1344  tobramycin (NEBCIN) powder  Status:  Discontinued       As needed 08/29/14 1344 08/29/14 1556   08/29/14 1343  vancomycin (VANCOCIN) powder  Status:  Discontinued       As needed 08/29/14 1343 08/29/14 1556   08/26/14 1400  ceFAZolin (ANCEF) IVPB 2 g/50 mL premix  Status:  Discontinued    Comments:  Will adjust duration after wounds assessed in OR tomorrow   2 g 100 mL/hr over 30 Minutes Intravenous 3 times per day 08/26/14 1147 08/29/14 1616   08/24/14 1615  ceFAZolin (ANCEF) IVPB 2 g/50 mL premix  Status:  Discontinued     2 g 100 mL/hr over 30 Minutes Intravenous  Once 08/24/14 1602 08/26/14 1206      Assessment/Plan: s/p Procedure(s): REMOVAL EXTERNAL FIXATION LEG OPEN REDUCTION INTERNAL FIXATION (ORIF) DISTAL FEMUR FRACTURE INTRAMEDULLARY (IM) NAIL TIBIAL IRRIGATION AND DEBRIDEMENT RIGHT FOOT REPAIR QUADRICEP TENDON -dc CVC, place PIV -continue PT -tx to floor  LOS: 21 days
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
## 2014-09-15 LAB — CBC
HCT: 30.8 % — ABNORMAL LOW (ref 36.0–46.0)
Hemoglobin: 9.5 g/dL — ABNORMAL LOW (ref 12.0–15.0)
MCH: 28.6 pg (ref 26.0–34.0)
MCHC: 30.8 g/dL (ref 30.0–36.0)
MCV: 92.8 fL (ref 78.0–100.0)
Platelets: 381 10*3/uL (ref 150–400)
RBC: 3.32 MIL/uL — ABNORMAL LOW (ref 3.87–5.11)
RDW: 16.8 % — ABNORMAL HIGH (ref 11.5–15.5)
WBC: 8.2 10*3/uL (ref 4.0–10.5)

## 2014-09-15 LAB — GLUCOSE, CAPILLARY
Glucose-Capillary: 108 mg/dL — ABNORMAL HIGH (ref 65–99)
Glucose-Capillary: 112 mg/dL — ABNORMAL HIGH (ref 65–99)
Glucose-Capillary: 118 mg/dL — ABNORMAL HIGH (ref 65–99)
Glucose-Capillary: 78 mg/dL (ref 65–99)
Glucose-Capillary: 91 mg/dL (ref 65–99)
Glucose-Capillary: 94 mg/dL (ref 65–99)

## 2014-09-15 LAB — PROTIME-INR
INR: 1.17 (ref 0.00–1.49)
Prothrombin Time: 15.1 seconds (ref 11.6–15.2)

## 2014-09-15 MED ORDER — DOCUSATE SODIUM 100 MG PO CAPS
100.0000 mg | ORAL_CAPSULE | Freq: Two times a day (BID) | ORAL | Status: DC
  Administered 2014-09-15 – 2014-09-16 (×3): 100 mg via ORAL
  Filled 2014-09-15 (×3): qty 1

## 2014-09-15 MED ORDER — WARFARIN SODIUM 5 MG PO TABS
10.0000 mg | ORAL_TABLET | Freq: Once | ORAL | Status: AC
  Administered 2014-09-15: 10 mg via ORAL
  Filled 2014-09-15: qty 2

## 2014-09-15 MED ORDER — POLYETHYLENE GLYCOL 3350 17 G PO PACK
17.0000 g | PACK | Freq: Every day | ORAL | Status: DC
  Administered 2014-09-15: 17 g via ORAL
  Filled 2014-09-15: qty 1
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
## 2014-09-15 NOTE — Plan of Care (Signed)
Problem: Phase I Progression Outcomes Goal: Sutures/staples intact Outcome: Progressing Sutures in place
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
## 2014-09-15 NOTE — Progress Notes (Signed)
Speech Language Pathology Treatment: Dysphagia;Cognitive-Linquistic  Patient Details Name: [name] MRN: [id number] DOB: 07-07-1965 Today's Date: 09/15/2014 Time: 4098-1191 SLP Time Calculation (min) (ACUTE ONLY): 28 min  Assessment / Plan / Recommendation Clinical Impression  Pt progressing toward goals, demonstrating longer periods of sustained attention though still easily distracted by environment during functional tasks. Emerging problem solving, intellectual awareness though safety and impulsivity still a concern. Pt able to self feed am meal with mod assist and mod verbal cues for attention and lingual sweep to left buccal cavity to retrieve pocketed food. No overt signs of aspiration except for throat clearing, as seen on MBS, unrelated to airway compromise. Pt also responding to moderate verbal cues for increased breath support and volume to compensate for hoarse vocal quality. Recommend CIR.    HPI Other Pertinent Information: see TBI Team evaluation   Pertinent Vitals Pain Assessment: No/denies pain  SLP Plan  Continue with current plan of care;MBS    Recommendations Diet recommendations: Dysphagia 2 (fine chop);Thin liquid Liquids provided via: Cup;Straw Medication Administration: Whole meds with puree Supervision: Patient able to self feed;Intermittent supervision to cue for compensatory strategies Compensations: Minimize environmental distractions;Check for pocketing Postural Changes and/or Swallow Maneuvers: Seated upright 90 degrees              General recommendations: Rehab consult Oral Care Recommendations: Oral care BID Follow up Recommendations: Inpatient Rehab Plan: Continue with current plan of care;MBS    GO     Ross Bender, Riley Nearing 09/15/2014, 9:05 AM

## 2014-09-15 NOTE — Plan of Care (Signed)
Problem: Phase I Progression Outcomes Goal: Incision/dressings dry and intact Outcome: Progressing Multiple suture lines on both legs.

## 2014-09-16 ENCOUNTER — Inpatient Hospital Stay (HOSPITAL_COMMUNITY)
Admission: RE | Admit: 2014-09-16 | Payer: MEDICAID | Source: Intra-hospital | Admitting: Physical Medicine & Rehabilitation

## 2014-09-16 ENCOUNTER — Encounter (HOSPITAL_COMMUNITY): Payer: Self-pay | Admitting: Rehabilitation

## 2014-09-16 ENCOUNTER — Encounter: Payer: Self-pay | Admitting: Interventional Cardiology

## 2014-09-16 ENCOUNTER — Inpatient Hospital Stay (HOSPITAL_COMMUNITY)
Admission: RE | Admit: 2014-09-16 | Discharge: 2014-10-24 | DRG: 560 | Disposition: A | Discharge status: Skilled Nursing Facility | Payer: Medicaid Other | Source: Intra-hospital | Attending: Physical Medicine & Rehabilitation | Admitting: Physical Medicine & Rehabilitation

## 2014-09-16 DIAGNOSIS — R339 Retention of urine, unspecified: Secondary | ICD-10-CM | POA: Diagnosis present

## 2014-09-16 DIAGNOSIS — I10 Essential (primary) hypertension: Secondary | ICD-10-CM | POA: Diagnosis present

## 2014-09-16 DIAGNOSIS — S72302D Unspecified fracture of shaft of left femur, subsequent encounter for closed fracture with routine healing: Secondary | ICD-10-CM | POA: Diagnosis present

## 2014-09-16 DIAGNOSIS — R1310 Dysphagia, unspecified: Secondary | ICD-10-CM | POA: Diagnosis present

## 2014-09-16 DIAGNOSIS — S92001D Unspecified fracture of right calcaneus, subsequent encounter for fracture with routine healing: Secondary | ICD-10-CM | POA: Diagnosis not present

## 2014-09-16 DIAGNOSIS — Z79899 Other long term (current) drug therapy: Secondary | ICD-10-CM

## 2014-09-16 DIAGNOSIS — Z885 Allergy status to narcotic agent status: Secondary | ICD-10-CM

## 2014-09-16 DIAGNOSIS — R5383 Other fatigue: Secondary | ICD-10-CM

## 2014-09-16 DIAGNOSIS — F17210 Nicotine dependence, cigarettes, uncomplicated: Secondary | ICD-10-CM | POA: Diagnosis present

## 2014-09-16 DIAGNOSIS — I252 Old myocardial infarction: Secondary | ICD-10-CM

## 2014-09-16 DIAGNOSIS — S82142D Displaced bicondylar fracture of left tibia, subsequent encounter for closed fracture with routine healing: Secondary | ICD-10-CM

## 2014-09-16 DIAGNOSIS — S069X9A Unspecified intracranial injury with loss of consciousness of unspecified duration, initial encounter: Secondary | ICD-10-CM

## 2014-09-16 DIAGNOSIS — S82201A Unspecified fracture of shaft of right tibia, initial encounter for closed fracture: Secondary | ICD-10-CM | POA: Diagnosis present

## 2014-09-16 DIAGNOSIS — F319 Bipolar disorder, unspecified: Secondary | ICD-10-CM | POA: Diagnosis present

## 2014-09-16 DIAGNOSIS — S72401A Unspecified fracture of lower end of right femur, initial encounter for closed fracture: Secondary | ICD-10-CM

## 2014-09-16 DIAGNOSIS — S82401A Unspecified fracture of shaft of right fibula, initial encounter for closed fracture: Secondary | ICD-10-CM | POA: Diagnosis present

## 2014-09-16 DIAGNOSIS — K5900 Constipation, unspecified: Secondary | ICD-10-CM | POA: Diagnosis present

## 2014-09-16 DIAGNOSIS — S069X3A Unspecified intracranial injury with loss of consciousness of 1 hour to 5 hours 59 minutes, initial encounter: Secondary | ICD-10-CM

## 2014-09-16 DIAGNOSIS — S069XAA Unspecified intracranial injury with loss of consciousness status unknown, initial encounter: Secondary | ICD-10-CM

## 2014-09-16 DIAGNOSIS — Z7982 Long term (current) use of aspirin: Secondary | ICD-10-CM | POA: Diagnosis not present

## 2014-09-16 DIAGNOSIS — S72401D Unspecified fracture of lower end of right femur, subsequent encounter for closed fracture with routine healing: Secondary | ICD-10-CM

## 2014-09-16 DIAGNOSIS — S92001B Unspecified fracture of right calcaneus, initial encounter for open fracture: Secondary | ICD-10-CM

## 2014-09-16 DIAGNOSIS — T148XXA Other injury of unspecified body region, initial encounter: Secondary | ICD-10-CM

## 2014-09-16 DIAGNOSIS — S72302A Unspecified fracture of shaft of left femur, initial encounter for closed fracture: Secondary | ICD-10-CM

## 2014-09-16 DIAGNOSIS — S82142A Displaced bicondylar fracture of left tibia, initial encounter for closed fracture: Secondary | ICD-10-CM | POA: Diagnosis present

## 2014-09-16 DIAGNOSIS — M21332 Wrist drop, left wrist: Secondary | ICD-10-CM | POA: Diagnosis present

## 2014-09-16 DIAGNOSIS — S7292XA Unspecified fracture of left femur, initial encounter for closed fracture: Secondary | ICD-10-CM | POA: Diagnosis present

## 2014-09-16 DIAGNOSIS — S7291XA Unspecified fracture of right femur, initial encounter for closed fracture: Secondary | ICD-10-CM | POA: Diagnosis present

## 2014-09-16 DIAGNOSIS — S92901B Unspecified fracture of right foot, initial encounter for open fracture: Secondary | ICD-10-CM | POA: Diagnosis present

## 2014-09-16 HISTORY — DX: Unspecified intracranial injury with loss of consciousness status unknown, initial encounter: S06.9XAA

## 2014-09-16 HISTORY — DX: Unspecified intracranial injury with loss of consciousness of unspecified duration, initial encounter: S06.9X9A

## 2014-09-16 LAB — CBC
HCT: 39.2 % (ref 36.0–46.0)
Hemoglobin: 12.3 g/dL (ref 12.0–15.0)
MCH: 29.2 pg (ref 26.0–34.0)
MCHC: 31.4 g/dL (ref 30.0–36.0)
MCV: 93.1 fL (ref 78.0–100.0)
Platelets: 374 10*3/uL (ref 150–400)
RBC: 4.21 MIL/uL (ref 3.87–5.11)
RDW: 16.3 % — ABNORMAL HIGH (ref 11.5–15.5)
WBC: 6.8 10*3/uL (ref 4.0–10.5)

## 2014-09-16 LAB — GLUCOSE, CAPILLARY
Glucose-Capillary: 101 mg/dL — ABNORMAL HIGH (ref 65–99)
Glucose-Capillary: 101 mg/dL — ABNORMAL HIGH (ref 65–99)
Glucose-Capillary: 112 mg/dL — ABNORMAL HIGH (ref 65–99)
Glucose-Capillary: 121 mg/dL — ABNORMAL HIGH (ref 65–99)

## 2014-09-16 LAB — PROTIME-INR
INR: 1.28 (ref 0.00–1.49)
Prothrombin Time: 16.1 seconds — ABNORMAL HIGH (ref 11.6–15.2)

## 2014-09-16 MED ORDER — FENTANYL 25 MCG/HR TD PT72
50.0000 ug | MEDICATED_PATCH | TRANSDERMAL | Status: DC
  Administered 2014-09-19 – 2014-09-28 (×4): 50 ug via TRANSDERMAL
  Filled 2014-09-16 (×4): qty 2

## 2014-09-16 MED ORDER — ACETAMINOPHEN 325 MG PO TABS
325.0000 mg | ORAL_TABLET | ORAL | Status: DC | PRN
  Administered 2014-09-16 – 2014-09-30 (×2): 650 mg via ORAL
  Filled 2014-09-16 (×3): qty 2

## 2014-09-16 MED ORDER — LORAZEPAM 2 MG/ML IJ SOLN: 1.0000 mg | INTRAMUSCULAR | Status: DC | PRN

## 2014-09-16 MED ORDER — QUETIAPINE FUMARATE 200 MG PO TABS
200.0000 mg | ORAL_TABLET | Freq: Two times a day (BID) | ORAL | Status: DC
  Administered 2014-09-16 – 2014-09-23 (×14): 200 mg via ORAL
  Filled 2014-09-16 (×14): qty 1

## 2014-09-16 MED ORDER — WARFARIN SODIUM 5 MG PO TABS: 10.0000 mg | ORAL_TABLET | Freq: Once | ORAL | Status: DC

## 2014-09-16 MED ORDER — CLONAZEPAM 0.5 MG PO TABS
0.5000 mg | ORAL_TABLET | Freq: Two times a day (BID) | ORAL | Status: DC
  Administered 2014-09-16 – 2014-10-24 (×76): 0.5 mg via ORAL
  Filled 2014-09-16 (×77): qty 1

## 2014-09-16 MED ORDER — ONDANSETRON HCL 4 MG/2ML IJ SOLN: 4.0000 mg | Freq: Four times a day (QID) | INTRAMUSCULAR | Status: DC | PRN

## 2014-09-16 MED ORDER — METOPROLOL TARTRATE 50 MG PO TABS
50.0000 mg | ORAL_TABLET | Freq: Two times a day (BID) | ORAL | Status: DC
  Administered 2014-09-16 – 2014-10-24 (×76): 50 mg via ORAL
  Filled 2014-09-16 (×64): qty 1
  Filled 2014-09-16: qty 4
  Filled 2014-09-16 (×6): qty 1
  Filled 2014-09-16: qty 4
  Filled 2014-09-16 (×3): qty 1
  Filled 2014-09-16: qty 2

## 2014-09-16 MED ORDER — WARFARIN - PHARMACIST DOSING INPATIENT
Freq: Every day | Status: DC
  Administered 2014-09-26 – 2014-10-11 (×6)

## 2014-09-16 MED ORDER — BISACODYL 10 MG RE SUPP
10.0000 mg | Freq: Every day | RECTAL | Status: DC | PRN
  Administered 2014-09-20: 10 mg via RECTAL
  Filled 2014-09-16: qty 1

## 2014-09-16 MED ORDER — PNEUMOCOCCAL VAC POLYVALENT 25 MCG/0.5ML IJ INJ
0.5000 mL | INJECTION | INTRAMUSCULAR | Status: DC
  Filled 2014-09-16 (×2): qty 0.5

## 2014-09-16 MED ORDER — POLYETHYLENE GLYCOL 3350 17 G PO PACK
17.0000 g | PACK | Freq: Two times a day (BID) | ORAL | Status: DC
  Administered 2014-09-17 – 2014-10-24 (×59): 17 g via ORAL
  Filled 2014-09-16 (×73): qty 1

## 2014-09-16 MED ORDER — FENTANYL 50 MCG/HR TD PT72
50.0000 ug | MEDICATED_PATCH | TRANSDERMAL | Status: DC
  Administered 2014-09-16: 50 ug via TRANSDERMAL
  Filled 2014-09-16: qty 1

## 2014-09-16 MED ORDER — ENOXAPARIN SODIUM 30 MG/0.3ML ~~LOC~~ SOLN
30.0000 mg | Freq: Two times a day (BID) | SUBCUTANEOUS | Status: DC
  Administered 2014-09-16 – 2014-09-24 (×16): 30 mg via SUBCUTANEOUS
  Filled 2014-09-16 (×17): qty 0.3

## 2014-09-16 MED ORDER — ESCITALOPRAM OXALATE 10 MG PO TABS
20.0000 mg | ORAL_TABLET | Freq: Every day | ORAL | Status: DC
  Administered 2014-09-17 – 2014-10-24 (×38): 20 mg via ORAL
  Filled 2014-09-16 (×39): qty 2

## 2014-09-16 MED ORDER — BISACODYL 10 MG RE SUPP: 10.0000 mg | Freq: Every day | RECTAL | Status: DC | PRN

## 2014-09-16 MED ORDER — BETHANECHOL CHLORIDE 10 MG PO TABS
10.0000 mg | ORAL_TABLET | Freq: Three times a day (TID) | ORAL | Status: DC
  Administered 2014-09-16 – 2014-10-06 (×61): 10 mg via ORAL
  Filled 2014-09-16 (×61): qty 1

## 2014-09-16 MED ORDER — SORBITOL 70 % SOLN
30.0000 mL | Freq: Every day | Status: DC | PRN
  Administered 2014-09-23 – 2014-10-15 (×4): 30 mL via ORAL
  Filled 2014-09-16 (×5): qty 30

## 2014-09-16 MED ORDER — VALPROIC ACID 250 MG PO CAPS
500.0000 mg | ORAL_CAPSULE | Freq: Two times a day (BID) | ORAL | Status: DC
  Administered 2014-09-16 – 2014-10-24 (×76): 500 mg via ORAL
  Filled 2014-09-16 (×76): qty 2

## 2014-09-16 MED ORDER — ONDANSETRON HCL 4 MG PO TABS: 4.0000 mg | ORAL_TABLET | Freq: Four times a day (QID) | ORAL | Status: DC | PRN

## 2014-09-16 MED ORDER — POLYETHYLENE GLYCOL 3350 17 G PO PACK
17.0000 g | PACK | Freq: Two times a day (BID) | ORAL | Status: DC
  Administered 2014-09-16: 17 g via ORAL
  Filled 2014-09-16: qty 1

## 2014-09-16 MED ORDER — CHLORHEXIDINE GLUCONATE 0.12 % MT SOLN
15.0000 mL | Freq: Two times a day (BID) | OROMUCOSAL | Status: DC
  Administered 2014-09-16 – 2014-10-24 (×53): 15 mL via OROMUCOSAL
  Filled 2014-09-16 (×63): qty 15

## 2014-09-16 MED ORDER — WARFARIN SODIUM 5 MG PO TABS
10.0000 mg | ORAL_TABLET | Freq: Once | ORAL | Status: AC
Start: 2014-09-16 — End: 2014-09-16
  Administered 2014-09-16: 10 mg via ORAL
  Filled 2014-09-16: qty 2

## 2014-09-16 MED ORDER — DOCUSATE SODIUM 100 MG PO CAPS
100.0000 mg | ORAL_CAPSULE | Freq: Two times a day (BID) | ORAL | Status: DC
  Administered 2014-09-17 – 2014-09-23 (×13): 100 mg via ORAL
  Filled 2014-09-16 (×16): qty 1

## 2014-09-16 MED ORDER — OXYCODONE HCL 5 MG/5ML PO SOLN
5.0000 mg | ORAL | Status: DC | PRN
  Administered 2014-09-16: 10 mg via ORAL
  Administered 2014-09-17: 15 mg via ORAL
  Filled 2014-09-16: qty 15
  Filled 2014-09-16: qty 10

## 2014-09-16 MED ORDER — ADULT MULTIVITAMIN W/MINERALS CH
1.0000 | ORAL_TABLET | Freq: Every day | ORAL | Status: DC
  Administered 2014-09-17 – 2014-10-24 (×38): 1 via ORAL
  Filled 2014-09-16 (×38): qty 1
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
## 2014-09-16 NOTE — Progress Notes (Signed)
Patient transferred to [address] west as ordered, report was given to [employer].
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
## 2014-09-16 NOTE — Care Management Note (Signed)
Case Management Note  Patient Details  Name: [name] MRN: [id number] Date of Birth: [birth year]  Subjective/Objective:     Pt approved for admission to [facility] IP rehab today.                 Action/Plan: Pt for dc to CIR later today.    Expected Discharge Date:  09/16/2014            Expected Discharge Plan:  Skilled Nursing Facility  In-House Referral:  Clinical Social Work  Discharge planning Services  CM Consult  Post Acute Care Choice:    Choice offered to:     DME Arranged:    DME Agency:     HH Arranged:    HH Agency:     Status of Service:  Completed, signed off  Medicare Important Message Given:    Date Medicare IM Given:    Medicare IM give by:    Date Additional Medicare IM Given:    Additional Medicare Important Message give by:     If discussed at Long Length of Stay Meetings, dates discussed:    Additional Comments:  Quintella Baton, RN, BSN  Trauma/Neuro ICU Case Manager [phone]

## 2014-09-16 NOTE — Progress Notes (Signed)
Rehab admissions - I am following pt's case and met with pt this am. Pt was alert and conversing with her son and Aunt. I gave further information about our rehab program and they are both in support of pt coming to CIR. I spoke with trauma MD and PA and received medical clearance. Pt will be admitted to inpatient rehab today. I completed admission paperwork with pt and her family. I also called and updated pt's mother.   I called and updated Almyra Free, trauma case Freight forwarder and Justice Education officer, museum. Pt's RN is also aware.  Thanks.  Nanetta Batty, PT Rehabilitation Admissions Coordinator [phone]

## 2014-09-17 ENCOUNTER — Inpatient Hospital Stay (HOSPITAL_COMMUNITY): Payer: Medicaid Other | Admitting: Physical Therapy

## 2014-09-17 ENCOUNTER — Inpatient Hospital Stay (HOSPITAL_COMMUNITY): Payer: Self-pay | Admitting: Occupational Therapy

## 2014-09-17 ENCOUNTER — Inpatient Hospital Stay (HOSPITAL_COMMUNITY): Payer: Medicaid Other | Admitting: Speech Pathology

## 2014-09-17 DIAGNOSIS — S82201D Unspecified fracture of shaft of right tibia, subsequent encounter for closed fracture with routine healing: Secondary | ICD-10-CM

## 2014-09-17 DIAGNOSIS — S7292XD Unspecified fracture of left femur, subsequent encounter for closed fracture with routine healing: Secondary | ICD-10-CM

## 2014-09-17 DIAGNOSIS — S7291XD Unspecified fracture of right femur, subsequent encounter for closed fracture with routine healing: Secondary | ICD-10-CM

## 2014-09-17 DIAGNOSIS — S82202D Unspecified fracture of shaft of left tibia, subsequent encounter for closed fracture with routine healing: Secondary | ICD-10-CM

## 2014-09-17 DIAGNOSIS — S069X3D Unspecified intracranial injury with loss of consciousness of 1 hour to 5 hours 59 minutes, subsequent encounter: Secondary | ICD-10-CM

## 2014-09-17 LAB — COMPREHENSIVE METABOLIC PANEL
ALT: 29 U/L (ref 14–54)
AST: 36 U/L (ref 15–41)
Albumin: 2.4 g/dL — ABNORMAL LOW (ref 3.5–5.0)
Alkaline Phosphatase: 169 U/L — ABNORMAL HIGH (ref 38–126)
Anion gap: 8 (ref 5–15)
BUN: 13 mg/dL (ref 6–20)
CO2: 28 mmol/L (ref 22–32)
Calcium: 9.1 mg/dL (ref 8.9–10.3)
Chloride: 100 mmol/L — ABNORMAL LOW (ref 101–111)
Creatinine, Ser: 0.66 mg/dL (ref 0.44–1.00)
GFR calc Af Amer: >60 mL/min (ref >60)
GFR calc non Af Amer: >60 mL/min (ref >60)
Glucose, Bld: 97 mg/dL (ref 65–99)
Potassium: 4.7 mmol/L (ref 3.5–5.1)
Sodium: 136 mmol/L (ref 135–145)
Total Bilirubin: 0.6 mg/dL (ref 0.3–1.2)
Total Protein: 7.2 g/dL (ref 6.5–8.1)

## 2014-09-17 LAB — CBC WITH DIFFERENTIAL/PLATELET
Basophils Absolute: 0 10*3/uL (ref 0.0–0.1)
Basophils Relative: 0 % (ref 0–1)
Eosinophils Absolute: 0.1 10*3/uL (ref 0.0–0.7)
Eosinophils Relative: 2 % (ref 0–5)
HCT: 39.8 % (ref 36.0–46.0)
Hemoglobin: 12.6 g/dL (ref 12.0–15.0)
Lymphocytes Relative: 20 % (ref 12–46)
Lymphs Abs: 1.2 10*3/uL (ref 0.7–4.0)
MCH: 29.3 pg (ref 26.0–34.0)
MCHC: 31.7 g/dL (ref 30.0–36.0)
MCV: 92.6 fL (ref 78.0–100.0)
Monocytes Absolute: 1 10*3/uL (ref 0.1–1.0)
Monocytes Relative: 17 % — ABNORMAL HIGH (ref 3–12)
Neutro Abs: 3.5 10*3/uL (ref 1.7–7.7)
Neutrophils Relative %: 61 % (ref 43–77)
Platelets: 330 10*3/uL (ref 150–400)
RBC: 4.3 MIL/uL (ref 3.87–5.11)
RDW: 16.5 % — ABNORMAL HIGH (ref 11.5–15.5)
WBC: 5.8 10*3/uL (ref 4.0–10.5)

## 2014-09-17 LAB — PROTIME-INR
INR: 1.29 (ref 0.00–1.49)
Prothrombin Time: 16.2 seconds — ABNORMAL HIGH (ref 11.6–15.2)

## 2014-09-17 MED ORDER — PNEUMOCOCCAL VAC POLYVALENT 25 MCG/0.5ML IJ INJ
0.5000 mL | INJECTION | INTRAMUSCULAR | Status: DC
  Filled 2014-09-17: qty 0.5

## 2014-09-17 MED ORDER — OXYCODONE HCL 5 MG PO TABS
10.0000 mg | ORAL_TABLET | ORAL | Status: DC | PRN
  Administered 2014-09-18 – 2014-09-20 (×5): 10 mg via ORAL
  Filled 2014-09-17 (×7): qty 2

## 2014-09-17 MED ORDER — WARFARIN SODIUM 7.5 MG PO TABS
12.5000 mg | ORAL_TABLET | Freq: Once | ORAL | Status: AC
  Administered 2014-09-17: 12.5 mg via ORAL
  Filled 2014-09-17 (×2): qty 1
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
## 2014-09-17 NOTE — Progress Notes (Signed)
Patient information reviewed and entered into eRehab system by Nakaila Freeze, RN, CRRN, PPS Coordinator.  Information including medical coding and functional independence measure will be reviewed and updated through discharge.    

## 2014-09-17 NOTE — Progress Notes (Signed)
Physical Therapy Session Note  Patient Details  Name: [name] MRN: [id number] Date of Birth: 09-Jul-1965  Today's Date: 09/17/2014 PT Individual Time: 1515-1600 PT Individual Time Calculation (min): 45 min   Short Term Goals: Week 1:  PT Short Term Goal 1 (Week 1): Patient will sustain attention to functional task x 5 min with mod cues.  PT Short Term Goal 2 (Week 1): Patient will utilize hospital bed functions to perform bed mobility with min A overall.  PT Short Term Goal 3 (Week 1): Patient will transfer bed <> wheelchair with assist of one person.  PT Short Term Goal 4 (Week 1): Patient will consistently recall pressure relief techniques with min cues.  PT Short Term Goal 5 (Week 1): Patient will initiate wheelchair propulsion.   Skilled Therapeutic Interventions/Progress Updates:  Focus on functional mobility training, attention, and cognitive retraining. Patient semi reclined in bed. With greatly increased time and max verbal/tactile cues for sequencing, patient transferred to edge of bed with supervision for sitting balance with assessment of AROM BLE. During sitting EOB, discussed with patient living situation and possible assistance at discharge but patient unsure if mother can provide 24/7. Patient tearful due to increased pain but when offered to return to bed or transfer to wheelchair patient initially declined. Eventually patient agreeable to returning to bed with mod A for BLE and total A x 2 to reposition higher in bed. Kickstand setup for [facility] to encourage more neutral alignment as patient tends to keep RLE in externally rotated position. Semi reclined in bed, patient engaged in basic money management task with coins with max multimodal cues and more than reasonable amount of time. Patient left semi reclined in bed with bed alarm on and all needs within reach.   Therapy Documentation Precautions:  Precautions Precautions: Fall Required Braces or Orthoses: Other  Brace/Splint Other Brace/Splint: Right PRAFO Restrictions Weight Bearing Restrictions: Yes RLE Weight Bearing: Non weight bearing LLE Weight Bearing: Non weight bearing Other Position/Activity Restrictions: ROM BLE ok EXCEPT R ankle Pain:  Unrated RLE pain in dependent position/with movement  See FIM for current functional status  Therapy/Group: Individual Therapy  Kerney Elbe 09/17/2014, 4:14 PM

## 2014-09-18 ENCOUNTER — Inpatient Hospital Stay (HOSPITAL_COMMUNITY): Payer: Medicaid Other | Admitting: Physical Therapy

## 2014-09-18 ENCOUNTER — Inpatient Hospital Stay (HOSPITAL_COMMUNITY): Payer: Medicaid Other | Admitting: Speech Pathology

## 2014-09-18 ENCOUNTER — Inpatient Hospital Stay (HOSPITAL_COMMUNITY): Payer: Medicaid Other

## 2014-09-18 DIAGNOSIS — S069X3S Unspecified intracranial injury with loss of consciousness of 1 hour to 5 hours 59 minutes, sequela: Secondary | ICD-10-CM

## 2014-09-18 DIAGNOSIS — S5422XA Injury of radial nerve at forearm level, left arm, initial encounter: Secondary | ICD-10-CM

## 2014-09-18 LAB — PROTIME-INR
INR: 1.44 (ref 0.00–1.49)
Prothrombin Time: 17.6 seconds — ABNORMAL HIGH (ref 11.6–15.2)

## 2014-09-18 MED ORDER — WARFARIN SODIUM 7.5 MG PO TABS
12.5000 mg | ORAL_TABLET | Freq: Once | ORAL | Status: AC
  Administered 2014-09-18: 12.5 mg via ORAL
  Filled 2014-09-18 (×2): qty 1

## 2014-09-18 NOTE — Progress Notes (Signed)
Physical Therapy Session Note  Patient Details  Name: [name] MRN: [id number] Date of Birth: [birth year]  Today's Date: 09/18/2014 PT Individual Time: 0805-0905 PT Individual Time Calculation (min): 60 min   Short Term Goals: Week 1:  PT Short Term Goal 1 (Week 1): Patient will sustain attention to functional task x 5 min with mod cues.  PT Short Term Goal 2 (Week 1): Patient will utilize hospital bed functions to perform bed mobility with min A overall.  PT Short Term Goal 3 (Week 1): Patient will transfer bed <> wheelchair with assist of one person.  PT Short Term Goal 4 (Week 1): Patient will consistently recall pressure relief techniques with min cues.  PT Short Term Goal 5 (Week 1): Patient will initiate wheelchair propulsion.   Skilled Therapeutic Interventions/Progress Updates:   Session focused on functional transfers, sequencing, problem solving, wheelchair mobility, and activity tolerance. Patient semi reclined in bed eating breakfast, NT departing. Patient required cuing at end of meal for L buccal pocketing and max cues for problem solving to use RUE to stabilize LUE, patient demonstrating tenodesis-like grip using LUE but unable to grip/hold cup. Patient reports being right handed but noted throughout session to utilize LUE majority of time for functional tasks. With [facility] elevated, patient transferred with mod A for scooting in bed in preparation for AP transfer, patient able to move BLE on bed without assist with increased time. Performed AP transfer bed > wheelchair with max A of one person using bed pad under patient (no undergarments or clothes donned) with max verbal cues for forward lean, hand placement, and sequencing. Required assist of 2 people to back wheelchair up from bed and support BLE in order to don leg rests. Patient propelled wheelchair using BUE in controlled environment including obstacle negotiation and turning up to 150 ft at a time before requiring rest breaks  with supervision overall and mod verbal/demonstration cues for sequencing turns. Patient disoriented to date, total A for re-orientation but unable to recall < 10 min delay. Patient suggested appropriate compensatory strategy, stating, "I should write that down." Wall calendar acquired for patient (not present in room). Patient appeared more alert this date and was able to navigate back to room/recall room number with min cues.   Therapy Documentation Precautions:  Precautions Precautions: Fall Required Braces or Orthoses: Other Brace/Splint Other Brace/Splint: Right PRAFO Restrictions Weight Bearing Restrictions: Yes RLE Weight Bearing: Non weight bearing LLE Weight Bearing: Non weight bearing Other Position/Activity Restrictions: ROM BLE ok EXCEPT R ankle Pain: Pain Assessment Pain Assessment: 0-10 Pain Score: 4  Pain Type: Acute pain Pain Location: Foot Pain Orientation: Right;Distal Pain Descriptors / Indicators: Discomfort Pain Onset: On-going Pain Intervention(s): Repositioned;Emotional support  See FIM for current functional status  Therapy/Group: Individual Therapy  Kerney Elbe 09/18/2014, 9:37 AM

## 2014-09-18 NOTE — Plan of Care (Signed)
Problem: RH BLADDER ELIMINATION Goal: RH STG MANAGE BLADDER WITH ASSISTANCE STG Manage Bladder With min Assistance  Outcome: Not Progressing Foley  Goal: RH STG MANAGE BLADDER WITH EQUIPMENT WITH ASSISTANCE STG Manage Bladder With Equipment With Min Assistance  Outcome: Not Progressing Foley

## 2014-09-18 NOTE — Care Management Note (Signed)
Inpatient Rehabilitation Center Individual Statement of Services  Patient Name:  [name]  Date:  09/18/2014  Welcome to the Inpatient Rehabilitation Center.  Our goal is to provide you with an individualized program based on your diagnosis and situation, designed to meet your specific needs.  With this comprehensive rehabilitation program, you will be expected to participate in at least 3 hours of rehabilitation therapies Monday-Friday, with modified therapy programming on the weekends.  Your rehabilitation program will include the following services:  Physical Therapy (PT), Occupational Therapy (OT), Speech Therapy (ST), 24 hour per day rehabilitation nursing, Therapeutic Recreaction (TR), Neuropsychology, Case Management (Social Worker), Rehabilitation Medicine, Nutrition Services and Pharmacy Services  Weekly team conferences will be held on Tuesdays to discuss your progress.  Your Social Worker will talk with you frequently to get your input and to update you on team discussions.  Team conferences with you and your family in attendance may also be held.  Expected length of stay: 3 weeks  Overall anticipated outcome: minimal assistance  Depending on your progress and recovery, your program may change. Your Social Worker will coordinate services and will keep you informed of any changes. Your Social Worker's name and contact numbers are listed  below.  The following services may also be recommended but are not provided by the Inpatient Rehabilitation Center:   Driving Evaluations  Home Health Rehabiltiation Services  Outpatient Rehabilitation Services  Vocational Rehabilitation   Arrangements will be made to provide these services after discharge if needed.  Arrangements include referral to agencies that provide these services.  Your insurance has been verified to be:  Medicaid application has begun Your primary doctor is:  Dr. Thea Silversmith  Pertinent information will be shared with  your doctor and your insurance company.  Social Worker:  [place], [place] [phone] or [phone]   Information discussed with and copy given to patient by: Amada Jupiter, 09/18/2014, 5:47 PM
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
## 2014-09-18 NOTE — Progress Notes (Signed)
Physical Therapy Session Note  Patient Details  Name: [name] MRN: [id number] Date of Birth: [birth year]  Today's Date: 09/18/2014 PT Individual Time: 1610-1710 PT Individual Time Calculation (min): 60 min   Short Term Goals: Week 1:  PT Short Term Goal 1 (Week 1): Patient will sustain attention to functional task x 5 min with mod cues.  PT Short Term Goal 2 (Week 1): Patient will utilize hospital bed functions to perform bed mobility with min A overall.  PT Short Term Goal 3 (Week 1): Patient will transfer bed <> wheelchair with assist of one person.  PT Short Term Goal 4 (Week 1): Patient will consistently recall pressure relief techniques with min cues.  PT Short Term Goal 5 (Week 1): Patient will initiate wheelchair propulsion.   Skilled Therapeutic Interventions/Progress Updates:  Session focused on transfers and activity tolerance. Pt performed bed mobility with overall mod assist. Pt performed AP transfers X 3 with pad underneath to facilitate clearance requiring to max assist +2 (one person to control legs while moving away from bed/mat), moving toward a lowered surface, and max multimodal cuing for sequence, hand placement, and technique. Pt propelled wheelchair with supervision 150 feet using UE's requiring mod cuing for steering, technique, and hand placement. Pt completed task for cognition and sitting EOB tolerance of sorting bags into familiar food groups requiring max cuing for sorting strategy and for comparisons between objects, and requiring more than reasonable amount of time. Pt elected to be left sitting in room in wheelchair with family present, all needs in reach, and reminder to call for help for getting back in bed.  Therapy Documentation Precautions:  Precautions Precautions: Fall Required Braces or Orthoses: Other Brace/Splint Other Brace/Splint: Right PRAFO Restrictions Weight Bearing Restrictions: Yes RLE Weight Bearing: Non weight bearing LLE Weight  Bearing: Non weight bearing Other Position/Activity Restrictions: ROM BLE ok EXCEPT R ankle Pain: Pain Assessment Pain Assessment: Faces Pain Score: 3  Faces Pain Scale: Hurts whole lot Pain Type: Acute pain Pain Location: Leg Pain Orientation: Right;Left Pain Descriptors / Indicators: Aching;Discomfort Pain Onset: On-going Pain Intervention(s): Repositioned;Emotional support  See FIM for current functional status  Therapy/Group: Individual Therapy  [name] 09/18/2014, 5:40 PM
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
## 2014-09-18 NOTE — Progress Notes (Signed)
Speech Language Pathology Daily Session Note  Patient Details  Name: [name] MRN: [id number] Date of Birth: Dec 29, 1965  Today's Date: 09/18/2014 SLP Individual Time: 1500-1525 SLP Individual Time Calculation (min): 25 min  Short Term Goals: Week 1: SLP Short Term Goal 1 (Week 1): Patient will consume current diet with minimal overt s/s of aspiration and Mod A verbal cues for use of swallowing compensatory strategies.  SLP Short Term Goal 2 (Week 1): Patient will sustain attention to a functional task for 5 minutes with Mod A verbal cues for redirection.  SLP Short Term Goal 3 (Week 1): Patient will utilize external memory aids for recall of functional information with Min A multimodal cues.  SLP Short Term Goal 4 (Week 1): Patient will demonstrate functional problem solving for basic and familiar tasks with Mod A multimodal cues.  SLP Short Term Goal 5 (Week 1): Patient will self-monitor and correct errors with functional tasks with Max A multimodal cues.  SLP Short Term Goal 6 (Week 1): Patient will utilize an increased vocal intensity to maximize speech intelligibility at the sentence level to 90% with supervision verbal cues.   Skilled Therapeutic Interventions: Pt was seen for skilled ST targeting cognitive goals.  Upon arrival, pt was seated upright in bed, awake, lethargic, but agreeable to participate in ST.  SLP facilitated the session with a basic card game targeting functional problem solving.  Pt planned and executed a problem solving strategy during the abovementioned task with overall min assist verbal cues for thought organization and mental flexibility; however, RN had administered pain meds prior to SLP arrival, and pt became increasingly lethargic towards the end of today's therapy session and required up to max assist for sustained attention to task and awareness of errors.  Pt was left in bed with call bell within reach and bed alarm activated.  Continue per current plan of  care.     FIM:  Comprehension Comprehension Mode: Auditory Comprehension: 4-Understands basic 75 - 89% of the time/requires cueing 10 - 24% of the time Expression Expression Mode: Verbal Expression: 4-Expresses basic 75 - 89% of the time/requires cueing 10 - 24% of the time. Needs helper to occlude trach/needs to repeat words. Social Interaction Social Interaction: 4-Interacts appropriately 75 - 89% of the time - Needs redirection for appropriate language or to initiate interaction. Problem Solving Problem Solving: 3-Solves basic 50 - 74% of the time/requires cueing 25 - 49% of the time Memory Memory: 3-Recognizes or recalls 50 - 74% of the time/requires cueing 25 - 49% of the time FIM - Eating Eating Activity: 5: Set-up assist for open containers;5: Supervision/cues  Pain Pain Assessment Pain Assessment: No/denies pain   Therapy/Group: Individual Therapy  Kell Ferris, Melanee Spry 09/18/2014, 5:44 PM
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
## 2014-09-18 NOTE — Progress Notes (Signed)
Social Work Patient ID: [name], female   DOB: Nov 17, 1965, 49 y.o.   MRN: [id number]   Reviewed team conference with pt and left message for her mother including target LOS of 3 weeks and min assist goals overall.  Pt nods in agreement, however, need to confirm with mother that this care can be provided in the home.  Will continue to make attempts to reach her.  Marysa Wessner, LCSW

## 2014-09-18 NOTE — Progress Notes (Signed)
Occupational Therapy Session Note  Patient Details  Name: [name] MRN: [id number] Date of Birth: [birth year]  Today's Date: 09/18/2014 OT Individual Time: 1000-1100 OT Individual Time Calculation (min): 60 min    Short Term Goals: Week 1:  OT Short Term Goal 1 (Week 1): Pt will don UB clothing with setup OT Short Term Goal 2 (Week 1): Pt will thread LB clothing with AE with mod A OT Short Term Goal 3 (Week 1): Pt will transfer to [facility] A/P with max A with 1 person OT Short Term Goal 4 (Week 1): Pt will perform 3/3 grooming at sink with setup  Skilled Therapeutic Interventions/Progress Updates:    Pt seated in w/c upon arrival.  Pt declined bathing this morning and did not have any clothes.  Pt performed grooming tasks while seated in a/c at sink.  Pt initially engaged in w/c mobility to ADL apartment and discussed home setup and discharge planning.  Pt able to recall WBing precautions.  Pt propelled to day room and engaged in cognitive remediation including path finding and memory tasks.  Pt returned to room to call son to bring in clothing.  Pt remained in w/c with all needs within reach.  Focus on activity tolerance, OT goals, w/c mobility, discharge planning, cognitive remediation, and safety awareness.  Therapy Documentation Precautions:  Precautions Precautions: Fall Required Braces or Orthoses: Other Brace/Splint Other Brace/Splint: Right PRAFO Restrictions Weight Bearing Restrictions: Yes RLE Weight Bearing: Non weight bearing LLE Weight Bearing: Non weight bearing Other Position/Activity Restrictions: ROM BLE ok EXCEPT R ankle  Pain: Pain Assessment Pain Assessment: 0-10 Pain Score: 4  Pain Type: Acute pain Pain Location: Foot Pain Orientation: Right;Distal Pain Descriptors / Indicators: Discomfort Pain Onset: On-going Pain Intervention(s): Repositioned;Emotional support  See FIM for current functional status  Therapy/Group: Individual Therapy  Rich Brave 09/18/2014, 12:19 PM

## 2014-09-19 ENCOUNTER — Inpatient Hospital Stay (HOSPITAL_COMMUNITY): Payer: Self-pay | Admitting: Physical Therapy

## 2014-09-19 ENCOUNTER — Inpatient Hospital Stay (HOSPITAL_COMMUNITY): Payer: Medicaid Other | Admitting: Speech Pathology

## 2014-09-19 ENCOUNTER — Inpatient Hospital Stay (HOSPITAL_COMMUNITY): Payer: Self-pay

## 2014-09-19 ENCOUNTER — Inpatient Hospital Stay (HOSPITAL_COMMUNITY): Payer: Self-pay | Admitting: Occupational Therapy

## 2014-09-19 ENCOUNTER — Encounter (HOSPITAL_COMMUNITY): Payer: Self-pay

## 2014-09-19 DIAGNOSIS — M21332 Wrist drop, left wrist: Secondary | ICD-10-CM

## 2014-09-19 LAB — PROTIME-INR
INR: 1.54 — ABNORMAL HIGH (ref 0.00–1.49)
Prothrombin Time: 18.6 seconds — ABNORMAL HIGH (ref 11.6–15.2)

## 2014-09-19 MED ORDER — WARFARIN SODIUM 7.5 MG PO TABS
12.5000 mg | ORAL_TABLET | Freq: Once | ORAL | Status: AC
  Administered 2014-09-19: 12.5 mg via ORAL
  Filled 2014-09-19 (×2): qty 1

## 2014-09-19 NOTE — Progress Notes (Signed)
Speech Language Pathology Daily Session Note  Patient Details  Name: [name] MRN: [id number] Date of Birth: 05/31/1965  Today's Date: 09/19/2014 SLP Individual Time: 1400-1500 SLP Individual Time Calculation (min): 60 min  Short Term Goals: Week 1: SLP Short Term Goal 1 (Week 1): Patient will consume current diet with minimal overt s/s of aspiration and Mod A verbal cues for use of swallowing compensatory strategies.  SLP Short Term Goal 2 (Week 1): Patient will sustain attention to a functional task for 5 minutes with Mod A verbal cues for redirection.  SLP Short Term Goal 3 (Week 1): Patient will utilize external memory aids for recall of functional information with Min A multimodal cues.  SLP Short Term Goal 4 (Week 1): Patient will demonstrate functional problem solving for basic and familiar tasks with Mod A multimodal cues.  SLP Short Term Goal 5 (Week 1): Patient will self-monitor and correct errors with functional tasks with Max A multimodal cues.  SLP Short Term Goal 6 (Week 1): Patient will utilize an increased vocal intensity to maximize speech intelligibility at the sentence level to 90% with supervision verbal cues.   Skilled Therapeutic Interventions: Skilled treatment session focused on addressing cognition goals. SLP facilitated session by providing Max assist multimodal cues to sustain attention to a basic sorting task for 30-45 second increments.  Fatigue appeared to impact overall performance today. SLP also facilitated session with a basic writing task; patient required Min assist for recall of biographical information and Max assist multimodal cues for self-motoring and correcting of errors. At end of session patient required Total assist to locate call bell in bed; however, once she had it she was Mod I for demonstration of use.  Continue with current plan of care.   FIM:  Comprehension Comprehension Mode: Auditory Comprehension: 2-Understands basic 25 - 49% of the  time/requires cueing 51 - 75% of the time Expression Expression Mode: Verbal Expression: 3-Expresses basic 50 - 74% of the time/requires cueing 25 - 50% of the time. Needs to repeat parts of sentences. Social Interaction Social Interaction: 2-Interacts appropriately 25 - 49% of time - Needs frequent redirection. Problem Solving Problem Solving: 1-Solves basic less than 25% of the time - needs direction nearly all the time or does not effectively solve problems and may need a restraint for safety Memory Memory: 2-Recognizes or recalls 25 - 49% of the time/requires cueing 51 - 75% of the time  Pain Pain Assessment Pain Assessment: No/denies pain  Therapy/Group: Individual Therapy  Charlane Ferretti., CCC-SLP [phone]  Ajooni Karam 09/19/2014, 4:15 PM
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
## 2014-09-19 NOTE — Plan of Care (Signed)
Problem: RH BLADDER ELIMINATION Goal: RH STG MANAGE BLADDER WITH ASSISTANCE STG Manage Bladder With min Assistance  Outcome: Not Progressing Foley cath

## 2014-09-19 NOTE — Progress Notes (Signed)
Speech Language Pathology Daily Session Note  Patient Details  Name: Delmy Holdren MRN: [id number] Date of Birth: Sep 15, 1965  Today's Date: 09/19/2014 SLP Individual Time: 1100-1200 SLP Individual Time Calculation (min): 60 min  Short Term Goals: Week 1: SLP Short Term Goal 1 (Week 1): Patient will consume current diet with minimal overt s/s of aspiration and Mod A verbal cues for use of swallowing compensatory strategies.  SLP Short Term Goal 2 (Week 1): Patient will sustain attention to a functional task for 5 minutes with Mod A verbal cues for redirection.  SLP Short Term Goal 3 (Week 1): Patient will utilize external memory aids for recall of functional information with Min A multimodal cues.  SLP Short Term Goal 4 (Week 1): Patient will demonstrate functional problem solving for basic and familiar tasks with Mod A multimodal cues.  SLP Short Term Goal 5 (Week 1): Patient will self-monitor and correct errors with functional tasks with Max A multimodal cues.  SLP Short Term Goal 6 (Week 1): Patient will utilize an increased vocal intensity to maximize speech intelligibility at the sentence level to 90% with supervision verbal cues.   Skilled Therapeutic Interventions: Skilled treatment session focused on cognitive and dysphagia goals. SLP facilitated session by providing Max A multimodal cues for focused attention to a functional task for ~60 seconds due to fatigue.  Patient required total A to self-monitor and correct errors during a basic written expression and reading comprehension task.  Patient also required Mod A verbal cues for use of an increased vocal intensity to increase intelligibility at the phrase level to 75%. Patient consumed lunch meal of Dys. 2 textures with thin liquids without overt s/s of aspiration but required Min A verbal cues for use of small bites/sips and to monitor left buccal pocketing. Patient handed off to NT to finish full supervision. Continue with current plan of  care.    FIM:  Comprehension Comprehension Mode: Auditory Comprehension: 2-Understands basic 25 - 49% of the time/requires cueing 51 - 75% of the time Expression Expression Mode: Verbal Expression: 3-Expresses basic 50 - 74% of the time/requires cueing 25 - 50% of the time. Needs to repeat parts of sentences. Social Interaction Social Interaction: 2-Interacts appropriately 25 - 49% of time - Needs frequent redirection. Problem Solving Problem Solving: 2-Solves basic 25 - 49% of the time - needs direction more than half the time to initiate, plan or complete simple activities Memory Memory: 2-Recognizes or recalls 25 - 49% of the time/requires cueing 51 - 75% of the time  Pain Pain Assessment Pain Assessment: No/denies pain  Therapy/Group: Individual Therapy  Briston Lax 09/19/2014, 4:42 PM

## 2014-09-19 NOTE — Progress Notes (Signed)
Orthopedic Tech Progress Note Patient Details:  [name] 1966/01/28 [id number]  Ortho Devices Type of Ortho Device: Velcro wrist splint Ortho Device/Splint Location: lue Ortho Device/Splint Interventions: Application   [name] 09/19/2014, 12:11 PM

## 2014-09-19 NOTE — Progress Notes (Signed)
Occupational Therapy Session Note  Patient Details  Name: [name] MRN: [id number] Date of Birth: Nov 13, 1965  Today's Date: 09/19/2014 OT Individual Time: 1000-1100 OT Individual Time Calculation (min): 60 min    Short Term Goals: Week 1:  OT Short Term Goal 1 (Week 1): Pt will don UB clothing with setup OT Short Term Goal 2 (Week 1): Pt will thread LB clothing with AE with mod A OT Short Term Goal 3 (Week 1): Pt will transfer to [facility] A/P with max A with 1 person OT Short Term Goal 4 (Week 1): Pt will perform 3/3 grooming at sink with setup  Skilled Therapeutic Interventions/Progress Updates:    Pt resting in bed upon arrival and agreeable to therapy.  Pt engaged in BADLs at bed level including bathing and dressing.  Pt was able to complete UB bathing without assistance but required assistance with rolling side to side to bathe buttocks.  Pt required max verbal cues for problem solving bathing and dressing tasks at bed level.  Pt required assistance donning pants at bed level.  Pt required more than a reasonable amount of time to complete tasks with multiple rest breaks throughout session.  Pt performed A/P transfer to w/c with tot A + 2 for BLE and equipment management.  Pt remained in w/c with all needs within reach. Focus on activity tolerance, problem solving, bed mobility, functional transfers, and safety awareness.  Therapy Documentation Precautions:  Precautions Precautions: Fall Required Braces or Orthoses: Other Brace/Splint Other Brace/Splint: Right PRAFO Restrictions Weight Bearing Restrictions: Yes RLE Weight Bearing: Non weight bearing LLE Weight Bearing: Non weight bearing Other Position/Activity Restrictions: ROM BLE ok EXCEPT R ankle Pain: Pain Assessment Pain Assessment: No/denies pain  See FIM for current functional status  Therapy/Group: Individual Therapy  Rich Brave 09/19/2014, 12:11 PM
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
## 2014-09-19 NOTE — Progress Notes (Signed)
Occupational Therapy Session Note  Patient Details  Name: [name] MRN: [id number] Date of Birth: 02-28-1965  Today's Date: 09/19/2014 OT Individual Time: 1300-1330 OT Individual Time Calculation (min): 30 min    Short Term Goals: Week 1:  OT Short Term Goal 1 (Week 1): Pt will don UB clothing with setup OT Short Term Goal 2 (Week 1): Pt will thread LB clothing with AE with mod A OT Short Term Goal 3 (Week 1): Pt will transfer to [facility] A/P with max A with 1 person OT Short Term Goal 4 (Week 1): Pt will perform 3/3 grooming at sink with setup  Skilled Therapeutic Interventions/Progress Updates:    1:1 Pt in bed when arrive. Pt with difficulty remaining awake and alert during session- continuing to doze off. Pt with new wrist splint today on left UE. Focus on NMR with left UE.  Pt with noted improved ability to elevated shoulder with elbow in extension to 100 degrees with increased speed. Pt with increased grip strength to hold light tan theraputty in ball shape. Pt able ot extend  And flex wrist 10 degrees. Pt with difficulty with working functionally on left UE due to fatigue.   Therapy Documentation Precautions:  Precautions Precautions: Fall Required Braces or Orthoses: Other Brace/Splint Other Brace/Splint: Right PRAFO Restrictions Weight Bearing Restrictions: Yes RLE Weight Bearing: Non weight bearing LLE Weight Bearing: Non weight bearing Other Position/Activity Restrictions: ROM BLE ok EXCEPT R ankle Pain: Pain Assessment Pain Assessment: No/denies pain  See FIM for current functional status  Therapy/Group: Individual Therapy  Roney Mans [facility] 09/19/2014, 3:24 PM

## 2014-09-19 NOTE — Progress Notes (Addendum)
Physical Therapy Session Note  Patient Details  Name: [name] MRN: [id number] Date of Birth: [birth year]  Today's Date: 09/19/2014 PT Individual Time: 1500-1600 PT Individual Time Calculation (min): 60 min   Short Term Goals: Week 1:  PT Short Term Goal 1 (Week 1): Patient will sustain attention to functional task x 5 min with mod cues.  PT Short Term Goal 2 (Week 1): Patient will utilize hospital bed functions to perform bed mobility with min A overall.  PT Short Term Goal 3 (Week 1): Patient will transfer bed <> wheelchair with assist of one person.  PT Short Term Goal 4 (Week 1): Patient will consistently recall pressure relief techniques with min cues.  PT Short Term Goal 5 (Week 1): Patient will initiate wheelchair propulsion.   Skilled Therapeutic Interventions/Progress Updates:   Focus on transfers, wheelchair mobility, awareness, attention, problem solving, and activity tolerance. Patient semi reclined resting in bed, easily aroused and performed AP transfer bed > wheelchair with max A of one and max multimodal cues for sequencing and technique. Therapist able to assist with BLE in order to don leg rests while patient backed up wheelchair from bed. Patient propelled wheelchair x 170 ft with close supervision and max multimodal cues for staying on task, sequencing, technique, obstacle negotiation on L, and turning. Patient resistant to therapist's cuing and stated, "You keep trying to rush me." Reinforced education with patient that she will be at wheelchair level at discharge due to BLE NWB status and need to improve efficiency and technique with propulsion for discharge home. Patient responded, "I'm sick of everyone telling me I'm going to need a wheelchair. I am trusting that God will get me through it." Discussed that patient will need wheelchair due to NWB precautions for at least 8 wks per surgeon or will be bed-bound. Patient dismissive of therapist's recommendations. Seated in  wheelchair, patient performed novel task with focus on attention and problem solving with max multimodal cues and more than reasonable amount of time. Patient requested to return to bed at end of session, performed AP transfer and bed mobility with +2 assist. Patient semi reclined in bed with all needs within reach, bed alarm on, and daughter and granddaughter at bedside.   Therapy Documentation Precautions:  Precautions Precautions: Fall Required Braces or Orthoses: Other Brace/Splint Other Brace/Splint: Right PRAFO Restrictions Weight Bearing Restrictions: Yes RLE Weight Bearing: Non weight bearing LLE Weight Bearing: Non weight bearing Other Position/Activity Restrictions: ROM BLE ok EXCEPT R ankle Pain: Pain Assessment Pain Assessment: Faces Faces Pain Scale: Hurts little more Pain Type: Acute pain Pain Location: Leg Pain Orientation: Right Pain Descriptors / Indicators: Grimacing Pain Onset: With Activity (transfers) Pain Intervention(s): Repositioned;Rest  See FIM for current functional status  Therapy/Group: Individual Therapy  Kerney Elbe 09/19/2014, 5:17 PM

## 2014-09-20 ENCOUNTER — Inpatient Hospital Stay (HOSPITAL_COMMUNITY): Payer: Medicaid Other | Admitting: Physical Therapy

## 2014-09-20 ENCOUNTER — Inpatient Hospital Stay (HOSPITAL_COMMUNITY): Payer: Medicaid Other | Admitting: Speech Pathology

## 2014-09-20 ENCOUNTER — Inpatient Hospital Stay (HOSPITAL_COMMUNITY): Payer: Self-pay | Admitting: Occupational Therapy

## 2014-09-20 LAB — PROTIME-INR
INR: 1.79 — ABNORMAL HIGH (ref 0.00–1.49)
Prothrombin Time: 20.7 seconds — ABNORMAL HIGH (ref 11.6–15.2)

## 2014-09-20 MED ORDER — WARFARIN SODIUM 7.5 MG PO TABS
12.5000 mg | ORAL_TABLET | Freq: Once | ORAL | Status: AC
  Administered 2014-09-20: 12.5 mg via ORAL
  Filled 2014-09-20 (×2): qty 1

## 2014-09-20 MED ORDER — OXYCODONE HCL 5 MG PO TABS
5.0000 mg | ORAL_TABLET | ORAL | Status: DC | PRN
  Administered 2014-09-20 – 2014-10-04 (×30): 5 mg via ORAL
  Filled 2014-09-20 (×35): qty 1

## 2014-09-20 NOTE — Plan of Care (Signed)
Problem: RH BOWEL ELIMINATION Goal: RH STG MANAGE BOWEL WITH ASSISTANCE STG Manage Bowel with min Assistance.  Outcome: Not Progressing LBM 09/16/2014  Problem: RH BLADDER ELIMINATION Goal: RH STG MANAGE BLADDER WITH MEDICATION WITH ASSISTANCE STG Manage Bladder With Medication With min Assistance.  Outcome: Not Progressing Foley

## 2014-09-20 NOTE — Progress Notes (Signed)
Occupational Therapy Session Note  Patient Details  Name: [name] MRN: [id number] Date of Birth: 1965/07/10  Today's Date: 09/20/2014 OT Individual Time: 1000-1100 OT Individual Time Calculation (min): 60 min    Short Term Goals: Week 1:  OT Short Term Goal 1 (Week 1): Pt will don UB clothing with setup OT Short Term Goal 2 (Week 1): Pt will thread LB clothing with AE with mod A OT Short Term Goal 3 (Week 1): Pt will transfer to [facility] A/P with max A with 1 person OT Short Term Goal 4 (Week 1): Pt will perform 3/3 grooming at sink with setup  Skilled Therapeutic Interventions/Progress Updates:    1:1 Pt in w/c when arrived. Focused on bathing at the sink sitting in w/c with LEs extended out sitting in elevated leg rest. Pt oriented x4 and more alert this session compared to yesterday afternoon session. Pt able to complete UB bathing and dressing with setup with extra time to sequence and organize the task. Pt able to demonstrate selective attention today with TV on in the background with extra time to process.  Focus on lateral leans while in the w/c to be able wash bottom and pull up pants. (laterally leaned into another chair built up with pillows). Pt able to reach bottom with steady A with LEs supported on leg rest or extended on bed. Pt with increased ability to manage/ move her LEs with extra time with min A. Pt transferred into recliner from w/c with lateral scoots with legs resting on bed. Pt with increased intellecual awareness and functional problem solving today.   Therapy Documentation Precautions:  Precautions Precautions: Fall Required Braces or Orthoses: Other Brace/Splint Other Brace/Splint: Right PRAFO Restrictions Weight Bearing Restrictions: Yes RLE Weight Bearing: Non weight bearing LLE Weight Bearing: Non weight bearing Other Position/Activity Restrictions: ROM BLE ok EXCEPT R ankle  Pain: 4/10 in bilateral knees See FIM for current functional  status  Therapy/Group: Individual Therapy  Roney Mans [facility] 09/20/2014, 10:05 AM

## 2014-09-20 NOTE — Progress Notes (Signed)
Speech Language Pathology Daily Session Notes  Patient Details  Name: [name] MRN: [id number] Date of Birth: [birth year]  Today's Date: 09/20/2014  Session 1: SLP Individual Time: 1100-1200 SLP Individual Time Calculation (min): 60 min   Session 2: SLP Individual Time: 1330-1400 SLP Individual Time Calculation (min): 30 min  Short Term Goals: Week 1: SLP Short Term Goal 1 (Week 1): Patient will consume current diet with minimal overt s/s of aspiration and Mod A verbal cues for use of swallowing compensatory strategies.  SLP Short Term Goal 2 (Week 1): Patient will sustain attention to a functional task for 5 minutes with Mod A verbal cues for redirection.  SLP Short Term Goal 3 (Week 1): Patient will utilize external memory aids for recall of functional information with Min A multimodal cues.  SLP Short Term Goal 4 (Week 1): Patient will demonstrate functional problem solving for basic and familiar tasks with Mod A multimodal cues.  SLP Short Term Goal 5 (Week 1): Patient will self-monitor and correct errors with functional tasks with Max A multimodal cues.  SLP Short Term Goal 6 (Week 1): Patient will utilize an increased vocal intensity to maximize speech intelligibility at the sentence level to 90% with supervision verbal cues.   Skilled Therapeutic Interventions:  Session 1: Skilled treatment session focused on cognitive goals. Upon arrival, patient was sitting upright in recliner but appeared lethargic, suspect due to recent pain medication. Patient required Max A verbal cues for focused attention for ~30 seconds due to constantly falling asleep. Patient required extra time and Max A verbal cues for sequencing with 4 and 6 step picture sequencing cards and demonstrated intermittent confusion (asking clinician if man in pictured cards was her boyfriend and looking around for a menu to order the spaghetti that was in the picture).  Patient also required total A for intellectual awareness  of her cognitive deficits as well as her constant fatigue and drifting off to sleep. RN made aware of patient's lethargy. Patient left upright in recliner with all needs within reach.  Continue with current plan of care.    Session 2: Skilled treatment session focused on cognitive goals. Upon arrival, patient was sitting upright in recliner but appeared lethargic. Patient required constant multimodal cues for focused attention for ~30 seconds with a basic problem solving task of utilizing a menu to choose meal options. Patient required Max-Total A for problem solving with task and demonstrated confusion by requesting numbers (813's) for meals instead of actual meal options. Patient also became frustrated with clinician because she required total A to self-monitor and correct errors with task. RN made aware. Patient left upright in recliner with all needs within reach.  Continue with current plan of care.     FIM:  Comprehension Comprehension Mode: Auditory Comprehension: 3-Understands basic 50 - 74% of the time/requires cueing 25 - 50%  of the time Expression Expression Mode: Verbal Expression: 3-Expresses basic 50 - 74% of the time/requires cueing 25 - 50% of the time. Needs to repeat parts of sentences. Social Interaction Social Interaction: 2-Interacts appropriately 25 - 49% of time - Needs frequent redirection. Problem Solving Problem Solving: 2-Solves basic 25 - 49% of the time - needs direction more than half the time to initiate, plan or complete simple activities Memory Memory: 2-Recognizes or recalls 25 - 49% of the time/requires cueing 51 - 75% of the time  Pain Pain Assessment Pain Assessment: Faces Faces Pain Scale: Hurts a little bit Pain Type: Surgical pain Pain Location: Leg  Pain Orientation: Right;Left Pain Descriptors / Indicators: Grimacing Pain Onset: With Activity Pain Intervention(s): Emotional support;Repositioned Multiple Pain Sites: No  Therapy/Group: Individual  Therapy  Rober Skeels 09/20/2014, 4:25 PM

## 2014-09-20 NOTE — Progress Notes (Signed)
Physical Therapy Session Note  Patient Details  Name: [name] MRN: [id number] Date of Birth: 04/01/1965  Today's Date: 09/20/2014 PT Individual Time: 0800-0900 PT Individual Time Calculation (min): 60 min   Short Term Goals: Week 1:  PT Short Term Goal 1 (Week 1): Patient will sustain attention to functional task x 5 min with mod cues.  PT Short Term Goal 2 (Week 1): Patient will utilize hospital bed functions to perform bed mobility with min A overall.  PT Short Term Goal 3 (Week 1): Patient will transfer bed <> wheelchair with assist of one person.  PT Short Term Goal 4 (Week 1): Patient will consistently recall pressure relief techniques with min cues.  PT Short Term Goal 5 (Week 1): Patient will initiate wheelchair propulsion.   Skilled Therapeutic Interventions/Progress Updates:  Session focused on transfers, LE strengthening, and cognition. Pt received eating breakfast in bed, pt required verbal cues to take smaller bites, and for food pocketing on L side of mouth. Pt received with R LE in ER and sign was added in room to use kickstand on PRAFO brace in order to keep R hip out of ER. Pt performed supine<>sit with min to mod A overall, and less cuing for technique. Pt performed AP transfers bed<>chair X 2 with less cuing for sequence and requiring overall 2+ max A to assist LE's and manage leg rests. Pt instructed and assisted to don L wrist splint prior to propelling wheelchair 175 feet bilateral UE's with supervision and cuing for steering to avoid objects on L side. Pt performed supine quad sets bilat, and L ankle AROM X 10. Pt performed AAROM bilat heel slides and hip abduction X 10. Pt instructed in cognitive activity of doubleing recipe, pt was able to complete first ingredient with no assist in more than reasonable amount of time, pt then faded to mod assist for second ingredient, and max assist for third.    Therapy Documentation Precautions:  Precautions Precautions:  Fall Required Braces or Orthoses: Other Brace/Splint Other Brace/Splint: Right PRAFO Restrictions Weight Bearing Restrictions: Yes RLE Weight Bearing: Non weight bearing LLE Weight Bearing: Non weight bearing Other Position/Activity Restrictions: ROM BLE ok EXCEPT R ankle Pain: Pain Assessment Pain Assessment: Faces Faces Pain Scale: Hurts a little bit Pain Type: Surgical pain Pain Location: Leg Pain Orientation: Right;Left Pain Descriptors / Indicators: Grimacing Pain Onset: With Activity Pain Intervention(s): Emotional support;Repositioned Multiple Pain Sites: No  See FIM for current functional status  Therapy/Group: Individual Therapy  [name] 09/20/2014, 12:48 PM
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
## 2014-09-20 NOTE — Progress Notes (Signed)
Physical Therapy Session Note  Patient Details  Name: [name] MRN: [id number] Date of Birth: 11-Mar-1965  Today's Date: 09/20/2014 PT Individual Time: 0800-0900 PT Individual Time Calculation (min): 60 min   Short Term Goals: Week 1:  PT Short Term Goal 1 (Week 1): Patient will sustain attention to functional task x 5 min with mod cues.  PT Short Term Goal 2 (Week 1): Patient will utilize hospital bed functions to perform bed mobility with min A overall.  PT Short Term Goal 3 (Week 1): Patient will transfer bed <> wheelchair with assist of one person.  PT Short Term Goal 4 (Week 1): Patient will consistently recall pressure relief techniques with min cues.  PT Short Term Goal 5 (Week 1): Patient will initiate wheelchair propulsion.   Skilled Therapeutic Interventions/Progress Updates:    Therapeutic Exercise: Pt received in recliner with foley and R PRAFO boot in place - very sleepy and req frequent re-arousal, but willing to participate during session.  PT instructs pt in AAROM B LE exercises with recliner in fully supine position: SLR, supine hip abduction/adduction, L ankle pumps, heel slides: x 10 reps each - pt cued to count with PT and occasionally req redirection for attention to task.  PT instructs pt in chair push ups: 2 x 10 reps - once with legs in elevated position, once with legs in partially dependent position.   Therapeutic Activity: PT trials lowering pt's legrest and placing legs in fully dependent position in recliner, and pt begins to squirm and have very uncomfortable look on her face, so pt's legs returned to elevated position.  PT instructs pt in lateral scoot transfer uphill from recliner to bed to pt's L req max A x 2, then long sit to supine req mod A at trunk.   Pt has one episode of getting angry with PT when she was confused about transitioning to the next LE there ex. Once PT apologized, pt immediately calmed down and continued participating with PT. Pt very  lethargic throughout PT session and with low activity tolerance. Continue per PT POC.   Therapy Documentation Precautions:  Precautions Precautions: Fall Required Braces or Orthoses: Other Brace/Splint Other Brace/Splint: Right PRAFO Restrictions Weight Bearing Restrictions: Yes RLE Weight Bearing: Non weight bearing LLE Weight Bearing: Non weight bearing Other Position/Activity Restrictions: ROM BLE ok EXCEPT R ankle Pain: Pain Assessment Pain Assessment: Faces Faces Pain Scale: Hurts a little bit Pain Type: Surgical pain Pain Location: Leg Pain Orientation: Right;Left Pain Descriptors / Indicators: Grimacing Pain Onset: With Activity Pain Intervention(s): Emotional support;Repositioned Multiple Pain Sites: No  See FIM for current functional status  Therapy/Group: Individual Therapy  [name] M 09/20/2014, 3:38 PM
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
## 2014-09-20 NOTE — Progress Notes (Signed)
Pt. c/o increased pain bil. knees today.  Oxy.10 mg given this AM; pt. very drowsy and  Suture removal started, pt.requring frequent more confused after dose.  Harvel Ricks PAC aware, Oxy. IR decreased to  PRN.. pt. tolerating.

## 2014-09-21 ENCOUNTER — Inpatient Hospital Stay (HOSPITAL_COMMUNITY): Payer: Medicaid Other | Admitting: Occupational Therapy

## 2014-09-21 ENCOUNTER — Inpatient Hospital Stay (HOSPITAL_COMMUNITY): Payer: Medicaid Other | Admitting: Speech Pathology

## 2014-09-21 DIAGNOSIS — S7292XS Unspecified fracture of left femur, sequela: Secondary | ICD-10-CM

## 2014-09-21 DIAGNOSIS — S92901S Unspecified fracture of right foot, sequela: Secondary | ICD-10-CM

## 2014-09-21 DIAGNOSIS — S069X1S Unspecified intracranial injury with loss of consciousness of 30 minutes or less, sequela: Secondary | ICD-10-CM

## 2014-09-21 DIAGNOSIS — S7291XS Unspecified fracture of right femur, sequela: Secondary | ICD-10-CM

## 2014-09-21 LAB — PROTIME-INR
INR: 1.56 — ABNORMAL HIGH (ref 0.00–1.49)
Prothrombin Time: 18.7 seconds — ABNORMAL HIGH (ref 11.6–15.2)

## 2014-09-21 MED ORDER — WARFARIN SODIUM 7.5 MG PO TABS
17.5000 mg | ORAL_TABLET | Freq: Once | ORAL | Status: AC
  Administered 2014-09-21: 17.5 mg via ORAL
  Filled 2014-09-21: qty 2
  Filled 2014-09-21: qty 1
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
## 2014-09-21 NOTE — Progress Notes (Signed)
Speech Language Pathology Daily Session Note  Patient Details  Name: Janet DaweMRN: [id number] Date of Birth: July 25, 1965  Today's Date: 09/21/2014 SLP Individual Time: 1045-1130 SLP Individual Time Calculation (min): 45 min  Short Term Goals: Week 1: SLP Short Term Goal 1 (Week 1): Patient will consume current diet with minimal overt s/s of aspiration and Mod A verbal cues for use of swallowing compensatory strategies.  SLP Short Term Goal 2 (Week 1): Patient will sustain attention to a functional task for 5 minutes with Mod A verbal cues for redirection.  SLP Short Term Goal 3 (Week 1): Patient will utilize external memory aids for recall of functional information with Min A multimodal cues.  SLP Short Term Goal 4 (Week 1): Patient will demonstrate functional problem solving for basic and familiar tasks with Mod A multimodal cues.  SLP Short Term Goal 5 (Week 1): Patient will self-monitor and correct errors with functional tasks with Max A multimodal cues.  SLP Short Term Goal 6 (Week 1): Patient will utilize an increased vocal intensity to maximize speech intelligibility at the sentence level to 90% with supervision verbal cues.   Skilled Therapeutic Interventions: Skilled treatment session focused on addressing dysphagia and cognition goals. Upon arrival, patient was sitting upright in recliner and SLP provided her with thin liquids and regular textures.  Patient initially demonstrated adequate attention to task and set it up appropriately with increased time.  As session progressed patient required Max A verbal cues to sustain attention for 45-60 second increments due to closing eyes and falling asleep.  Along the cues for sustained attention patient required extra time for effective mastication of regular textures.  She consumed regular textures and thin liquids with no overt s/s of aspiration even with lethergy.  SLP recommends skilled observation of Dys.3 trial tray prior to diet  advancement to due impact of lethargy on safe consumption of a whole meal.  Patient demonstrated intermittent confusion versus hallucination both visual (seeing people in the room that were not there) and auditory (What was that? Who said that? During moments of silence); RN made aware.  Patient left upright in recliner with all needs within reach.  Continue with current plan of care.    FIM:  Comprehension Comprehension Mode: Auditory Comprehension: 3-Understands basic 50 - 74% of the time/requires cueing 25 - 50%  of the time Expression Expression Mode: Verbal Expression: 3-Expresses basic 50 - 74% of the time/requires cueing 25 - 50% of the time. Needs to repeat parts of sentences. Social Interaction Social Interaction: 2-Interacts appropriately 25 - 49% of time - Needs frequent redirection. Problem Solving Problem Solving: 2-Solves basic 25 - 49% of the time - needs direction more than half the time to initiate, plan or complete simple activities Memory Memory: 2-Recognizes or recalls 25 - 49% of the time/requires cueing 51 - 75% of the time  Pain Pain Assessment Pain Assessment: No/denies pain  Therapy/Group: Individual Therapy  Charlane Ferretti., CCC-SLP [phone]  [name] 09/21/2014, 12:36 PM

## 2014-09-21 NOTE — Progress Notes (Signed)
Occupational Therapy Session Note  Patient Details  Name: [name] MRN: [id number] Date of Birth: July 18, 1965  Today's Date: 09/21/2014 OT Individual Time:  - 0830-0930  (60 min)  1st session       Short Term Goals: Week 1:  OT Short Term Goal 1 (Week 1): Pt will don UB clothing with setup OT Short Term Goal 2 (Week 1): Pt will thread LB clothing with AE with mod A OT Short Term Goal 3 (Week 1): Pt will transfer to [facility] A/P with max A with 1 person OT Short Term Goal 4 (Week 1): Pt will perform 3/3 grooming at sink with setup :     Skilled Therapeutic Interventions/Progress Updates:    1st session:  Focus on activity tolerance, problem solving, bed mobility, functional transfers, and safety awareness.  Pt. Lying in bed upon arrival.  Pt eating breakfast.   Pt wanted to shower.  Transferred to rolling shower chair with total +2. Using lateral scoots.    Pt. Sat on rolling shower chair with legs in dependent position for 30 minutes.  Pt. Had anterior LOB when reaching for grab bar and OT assisted pt with regaining balance.  Pt. Placed in recliner chair at end of session using hoyer due to side bars not coming down.    2nd session:  Time: 1400-1500   Pain:4/10 Individual session:  Transferred from recliner to wc.  Used hoyer due to recliner bars not going down.  Took increased time with maneuvering pad and getting pt comfortable. Pt. Performed grooming at sink with increased time.   Left in wc with pt using LUE to comb hair and all needs in reach.  .    Therapy Documentation Precautions:  Precautions Precautions: Fall Required Braces or Orthoses: Other Brace/Splint Other Brace/Splint: Right PRAFO Restrictions Weight Bearing Restrictions: Yes RLE Weight Bearing: Non weight bearing LLE Weight Bearing: Non weight bearing Other Position/Activity Restrictions: ROM BLE ok EXCEPT R ankle General:   Vital Signs: Pain: Pain Assessment Pain Assessment: Faces Faces Pain Scale: 6/10  1st  session Pain Type: Acute pain Pain Location: Leg Pain Orientation: Right Pain Descriptors / Indicators: Aching;Grimacing Pain Frequency: Intermittent Pain Onset: On-going Patients Stated Pain Goal: 3 Pain Intervention(s): Medication (See eMAR);Repositioned   :    See FIM for current functional status  Therapy/Group: Individual Therapy  Humberto Seals 09/21/2014, 8:10 AM
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
## 2014-09-22 ENCOUNTER — Inpatient Hospital Stay (HOSPITAL_COMMUNITY): Payer: Self-pay | Admitting: Rehabilitation

## 2014-09-22 DIAGNOSIS — R339 Retention of urine, unspecified: Secondary | ICD-10-CM

## 2014-09-22 LAB — PROTIME-INR
INR: 1.48 (ref 0.00–1.49)
Prothrombin Time: 18 seconds — ABNORMAL HIGH (ref 11.6–15.2)

## 2014-09-22 MED ORDER — WARFARIN SODIUM 7.5 MG PO TABS
20.0000 mg | ORAL_TABLET | Freq: Once | ORAL | Status: AC
  Administered 2014-09-22: 20 mg via ORAL
  Filled 2014-09-22: qty 1
  Filled 2014-09-22: qty 2

## 2014-09-22 MED ORDER — WARFARIN SODIUM 5 MG PO TABS: 10.0000 mg | ORAL_TABLET | Freq: Once | ORAL | Status: DC
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
## 2014-09-22 NOTE — Progress Notes (Signed)
Physical Therapy Session Note  Patient Details  Name: [name] MRN: [id number] Date of Birth: 01/13/1966  Today's Date: 09/22/2014 PT Individual Time: 1400-1500 PT Individual Time Calculation (min): 60 min   Short Term Goals: Week 1:  PT Short Term Goal 1 (Week 1): Patient will sustain attention to functional task x 5 min with mod cues.  PT Short Term Goal 2 (Week 1): Patient will utilize hospital bed functions to perform bed mobility with min A overall.  PT Short Term Goal 3 (Week 1): Patient will transfer bed <> wheelchair with assist of one person.  PT Short Term Goal 4 (Week 1): Patient will consistently recall pressure relief techniques with min cues.  PT Short Term Goal 5 (Week 1): Patient will initiate wheelchair propulsion.   Skilled Therapeutic Interventions/Progress Updates:   Pt received lying in bed, agreeable to therapy session.  Skilled session focused on bed mobility, functional transfers, recall of how to perform these tasks, following one to two step commands, sustained attention, memory and new learning.  Performed a/p transfers during session bed>w/c and w/c<>therapy mat with mod/max A (+2A for management of LEs and w/c parts) with use of bed pad to assist with scooting.  Pt did very well following directions getting into w/c with posterior scooting, however noted in therapy gym, needed max demonstration, tactile and verbal cues for initiation of forward scooting.  Performed w/c mobility to therapy gym x 150' with BUEs at S level with heavy cues for increased use of LUE during task.  Once in therapy gym on mat, assisted to EOM in same manner.  Pt unable to tolerate BLEs on floor, therefore propped on kay bench.  Note that she is also unable to tolerate RLE being in straight alignment for extended periods of time due to pain, however explained importance of maintaining alignment in bed.  Pt verbalized understanding.  Taught pt how to play game of "war" during session with pt  playing with rehab tech.  Pt with good recall on rules, how to separate cards and only requires min cues to recall what to do when cards are tied.  Note she continues to have difficulty sustaining attention during session and was easily distracted by pain, but refused pain medication.  Assisted back to w/c as above and to room.  Left with family and QRB donned for safety.  All needs in reach.   Therapy Documentation Precautions:  Precautions Precautions: Fall Precaution Comments: combative at times Required Braces or Orthoses: Other Brace/Splint Cervical Brace: At all times Other Brace/Splint: Right PRAFO Restrictions Weight Bearing Restrictions: Yes RLE Weight Bearing: Non weight bearing LLE Weight Bearing: Non weight bearing Other Position/Activity Restrictions: ROM BLE ok EXCEPT R ankle   Vital Signs: Therapy Vitals Temp: 98.5 F (36.9 C) Temp Source: Oral Pulse Rate: 79 Resp: 20 BP: 138/76 mmHg Patient Position (if appropriate): Sitting Oxygen Therapy SpO2: 100 % O2 Device: Not Delivered Pain: Pain Assessment Pain Assessment: 0-10 Pain Score: 6  Pain Type: Acute pain Pain Location: Leg Pain Orientation: Right Pain Descriptors / Indicators: Aching Pain Onset: Gradual Pain Intervention(s): Medication (See eMAR)   Locomotion : Wheelchair Mobility Distance: 150   See FIM for current functional status  Therapy/Group: Individual Therapy  Vista Deck 09/22/2014, 4:21 PM
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
## 2014-09-23 ENCOUNTER — Inpatient Hospital Stay (HOSPITAL_COMMUNITY): Payer: Medicaid Other | Admitting: Speech Pathology

## 2014-09-23 ENCOUNTER — Inpatient Hospital Stay (HOSPITAL_COMMUNITY): Payer: Medicaid Other | Admitting: Occupational Therapy

## 2014-09-23 ENCOUNTER — Inpatient Hospital Stay (HOSPITAL_COMMUNITY): Payer: Self-pay

## 2014-09-23 DIAGNOSIS — S82142S Displaced bicondylar fracture of left tibia, sequela: Secondary | ICD-10-CM

## 2014-09-23 DIAGNOSIS — S069X4S Unspecified intracranial injury with loss of consciousness of 6 hours to 24 hours, sequela: Secondary | ICD-10-CM

## 2014-09-23 LAB — CBC
HCT: 37.9 % (ref 36.0–46.0)
Hemoglobin: 12 g/dL (ref 12.0–15.0)
MCH: 29 pg (ref 26.0–34.0)
MCHC: 31.7 g/dL (ref 30.0–36.0)
MCV: 91.5 fL (ref 78.0–100.0)
Platelets: 263 10*3/uL (ref 150–400)
RBC: 4.14 MIL/uL (ref 3.87–5.11)
RDW: 16.2 % — ABNORMAL HIGH (ref 11.5–15.5)
WBC: 4.6 10*3/uL (ref 4.0–10.5)

## 2014-09-23 LAB — PROTIME-INR
INR: 1.68 — ABNORMAL HIGH (ref 0.00–1.49)
Prothrombin Time: 19.8 seconds — ABNORMAL HIGH (ref 11.6–15.2)

## 2014-09-23 MED ORDER — QUETIAPINE FUMARATE 50 MG PO TABS: 50.0000 mg | ORAL_TABLET | Freq: Every day | ORAL | Status: DC

## 2014-09-23 MED ORDER — GABAPENTIN 100 MG PO CAPS
100.0000 mg | ORAL_CAPSULE | Freq: Three times a day (TID) | ORAL | Status: DC
  Administered 2014-09-23 – 2014-09-26 (×12): 100 mg via ORAL
  Filled 2014-09-23 (×12): qty 1

## 2014-09-23 MED ORDER — QUETIAPINE FUMARATE 100 MG PO TABS
100.0000 mg | ORAL_TABLET | Freq: Every day | ORAL | Status: DC
  Administered 2014-09-24 – 2014-09-25 (×2): 100 mg via ORAL
  Filled 2014-09-23 (×2): qty 1

## 2014-09-23 MED ORDER — QUETIAPINE FUMARATE 200 MG PO TABS
200.0000 mg | ORAL_TABLET | Freq: Every day | ORAL | Status: DC
  Administered 2014-09-23 – 2014-10-23 (×31): 200 mg via ORAL
  Filled 2014-09-23 (×31): qty 1

## 2014-09-23 MED ORDER — WARFARIN SODIUM 7.5 MG PO TABS
20.0000 mg | ORAL_TABLET | Freq: Once | ORAL | Status: AC
  Administered 2014-09-23: 20 mg via ORAL
  Filled 2014-09-23: qty 1
  Filled 2014-09-23: qty 2

## 2014-09-23 MED ORDER — QUETIAPINE FUMARATE 200 MG PO TABS: 200.0000 mg | ORAL_TABLET | Freq: Every day | ORAL | Status: DC

## 2014-09-23 MED ORDER — SENNOSIDES-DOCUSATE SODIUM 8.6-50 MG PO TABS
2.0000 | ORAL_TABLET | Freq: Every day | ORAL | Status: DC
  Administered 2014-09-23 – 2014-10-23 (×26): 2 via ORAL
  Filled 2014-09-23 (×30): qty 2

## 2014-09-23 NOTE — Progress Notes (Signed)
Occupational Therapy Session Note  Patient Details  Name: [name] MRN: [id number] Date of Birth: 1965-03-20  Today's Date: 09/23/2014 OT Individual Time: 5621-3086 OT Individual Time Calculation (min): 58 min    Short Term Goals: Week 1:  OT Short Term Goal 1 (Week 1): Pt will don UB clothing with setup OT Short Term Goal 2 (Week 1): Pt will thread LB clothing with AE with mod A OT Short Term Goal 3 (Week 1): Pt will transfer to [facility] A/P with max A with 1 person OT Short Term Goal 4 (Week 1): Pt will perform 3/3 grooming at sink with setup  Skilled Therapeutic Interventions/Progress Updates:    Pt performed transfer from supine to wheelchair using posterior method with increased time and only min assist to steady the chair.  Therapist assisted with applying wheelchair foot rests and then had pt propel herself to the day room.  Pt initially unaware of the date as she stated the "8th".  Therapist attempted to help her figure it out with simple math calculation by having her add 7 to 8.  She demonstrated moderate difficulty figuring out that the date was the 15th and needed max demonstrational cueing for simple addition.  With transition to the day room had pt work on simple money management tasks by giving her a sum of money and having her total it up.  She needed max step by step cueing to add up bills as well as change.  She utilized pen and paper as well but could still not come up with the total without max cueing.  Returned to room after completion of activity with call button and phone within reach as well as safety belt in place.    Therapy Documentation Precautions:  Precautions Precautions: Fall Precaution Comments: combative at times Required Braces or Orthoses: Other Brace/Splint Cervical Brace: At all times Other Brace/Splint: Right PRAFO Restrictions Weight Bearing Restrictions: Yes RLE Weight Bearing: Non weight bearing LLE Weight Bearing: Non weight bearing Other  Position/Activity Restrictions: ROM BLE ok EXCEPT R ankle  Vital Signs: Therapy Vitals Temp: 98.5 F (36.9 C) Temp Source: Oral Pulse Rate: 85 Resp: 18 BP: (!) 126/59 mmHg Patient Position (if appropriate): Sitting Oxygen Therapy SpO2: 100 % O2 Device: Not Delivered Pain: Pain Assessment Pain Assessment: No/denies pain ADL: See FIM for current functional status  Therapy/Group: Individual Therapy  Lameeka Schleifer OTR/L 09/23/2014, 4:30 PM
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
## 2014-09-23 NOTE — Progress Notes (Signed)
Speech Language Pathology Weekly Progress and Session Notes  Patient Details  Name: [name] MRN: [id number] Date of Birth: 18-Aug-1965  Beginning of progress report period: September 16, 2014 End of progress report period: September 23, 2014  Today's Date: 09/23/2014 SLP Individual Time: 1100-1200 SLP Individual Time Calculation (min): 60 min  Short Term Goals: Week 1: SLP Short Term Goal 1 (Week 1): Patient will consume current diet with minimal overt s/s of aspiration and Mod A verbal cues for use of swallowing compensatory strategies.  SLP Short Term Goal 1 - Progress (Week 1): Met SLP Short Term Goal 2 (Week 1): Patient will sustain attention to a functional task for 5 minutes with Mod A verbal cues for redirection.  SLP Short Term Goal 2 - Progress (Week 1): Not met SLP Short Term Goal 3 (Week 1): Patient will utilize external memory aids for recall of functional information with Min A multimodal cues.  SLP Short Term Goal 3 - Progress (Week 1): Not met SLP Short Term Goal 4 (Week 1): Patient will demonstrate functional problem solving for basic and familiar tasks with Mod A multimodal cues.  SLP Short Term Goal 4 - Progress (Week 1): Not met SLP Short Term Goal 5 (Week 1): Patient will self-monitor and correct errors with functional tasks with Max A multimodal cues.  SLP Short Term Goal 5 - Progress (Week 1): Not met SLP Short Term Goal 6 (Week 1): Patient will utilize an increased vocal intensity to maximize speech intelligibility at the sentence level to 90% with supervision verbal cues.  SLP Short Term Goal 6 - Progress (Week 1): Met    New Short Term Goals: Week 2: SLP Short Term Goal 1 (Week 2): Patient will consume current diet with minimal overt s/s of aspiration and Min A verbal cues for use of swallowing compensatory strategies.  SLP Short Term Goal 2 (Week 2): Patient will sustain attention to a functional task for 2 minutes with Mod A verbal cues for redirection.  SLP Short  Term Goal 3 (Week 2): Patient will utilize external memory aids for recall of functional information with Mod A multimodal cues.  SLP Short Term Goal 4 (Week 2): Patient will demonstrate functional problem solving for basic and familiar tasks with Mod A multimodal cues.  SLP Short Term Goal 5 (Week 2): Patient will self-monitor and correct errors with functional tasks with Max A multimodal cues.  SLP Short Term Goal 6 (Week 2): Patient will utilize an increased vocal intensity to maximize speech intelligibility at the sentence level to 90% with Mod I.   Weekly Progress Updates: Patient has made minimal gains and has met 2 of 6 STG's this reporting period due to increased swallowing function and speech intelligibility. Currently, patient is consuming Dys. 3 textures with thin liquids with minimal overt s/s of aspiration and requires Mod-Max A multimodal cues for use of swallowing compensatory strategies.  Patient is also ~90-100% intelligible at the phrase and sentence level due to increased vocal intensity with supervision verbal cues.  Patient is currently demonstrating behaviors consistent with a Rancho Level VI and requires overall Max A to complete functional and familiar tasks safely in regards to sustained attention, functional problem solving, intellectual awareness and working memory. Patient's biggest barrier to reaching her goals is fatigue and lethargy. Patient requires constant verbal and tactile cues to maintain attention and keep her eyes open for ~30-60 seconds with minimal awareness.  Patient/family education is ongoing. Patient would benefit from continued skilled SLP intervention  to maximize cognitive and swallowing function and speech intelligibility in order to maximize her functional independence prior to discharge.    Intensity: Minumum of 1-2 x/day, 30 to 90 minutes Frequency: 3 to 5 out of 7 days Duration/Length of Stay: 2 weeks  Treatment/Interventions: Cognitive  remediation/compensation;Cueing hierarchy;Environmental controls;Functional tasks;Dysphagia/aspiration precaution training;Patient/family education;Internal/external aids;Speech/Language facilitation;Therapeutic Activities   Daily Session  Skilled Therapeutic Interventions: Skilled treatment session focused on cognitive and dysphagia goals. SLP facilitated session by providing Max A verbal and tactile cues for focused attention to cognitive task for ~30-45 seconds. Patient required Max A question cues for problem solving and reasoning during a basic verbal description task, however, patient was Mod I for use of an increased vocal intensity and was 100% intelligible at the phrase level. SLP also facilitated session by providing skilled observation with trial tray of Dys. 3 textures with thin liquids. Patient did not demonstrate any overt s/s of aspiration but required Max A verbal cues for sustained attention to self-feeding and Mod A verbal cues for use of small bites and to monitor left buccal pocketing. Despite patient's lethargy, patient demonstrated efficient mastication, therefore, recommend patient upgrade to Dys. 3 textures bu continue full supervision to maximize safety. Patient left supine in bed with alarm on and all needs within reach. Continue with current plan of care.      FIM:  Comprehension Comprehension Mode: Auditory Comprehension: 3-Understands basic 50 - 74% of the time/requires cueing 25 - 50%  of the time Expression Expression Mode: Verbal Expression: 3-Expresses basic 50 - 74% of the time/requires cueing 25 - 50% of the time. Needs to repeat parts of sentences. Social Interaction Social Interaction: 2-Interacts appropriately 25 - 49% of time - Needs frequent redirection. Problem Solving Problem Solving: 2-Solves basic 25 - 49% of the time - needs direction more than half the time to initiate, plan or complete simple activities Memory Memory: 2-Recognizes or recalls 25 - 49%  of the time/requires cueing 51 - 75% of the time FIM - Eating Eating Activity: 5: Set-up assist for open containers;5: Supervision/cues Pain No/Denies Pain   Therapy/Group: Individual Therapy  Rip Hawes 09/23/2014, 12:45 PM

## 2014-09-23 NOTE — Progress Notes (Signed)
Patient reported to have lethargy after am medications. Will attempt to taper seroquel and monitor for now.

## 2014-09-23 NOTE — Progress Notes (Signed)
PAC made aware pt. with foley, on Urecholine x 7 days. Orders received.

## 2014-09-23 NOTE — Progress Notes (Signed)
Physical Therapy Session Note  Patient Details  Name: [name] MRN: [id number] Date of Birth: [birth year]  Today's Date: 09/23/2014 PT Individual Time: 0830-0900 PT Individual Time Calculation (min): 30 min   Short Term Goals: Week 1:  PT Short Term Goal 1 (Week 1): Patient will sustain attention to functional task x 5 min with mod cues.  PT Short Term Goal 2 (Week 1): Patient will utilize hospital bed functions to perform bed mobility with min A overall.  PT Short Term Goal 3 (Week 1): Patient will transfer bed <> wheelchair with assist of one person.  PT Short Term Goal 4 (Week 1): Patient will consistently recall pressure relief techniques with min cues.  PT Short Term Goal 5 (Week 1): Patient will initiate wheelchair propulsion.   Skilled Therapeutic Interventions/Progress Updates:   Pt received lying in bed, agreeable to therapy session.  Skilled session focused on functional transfers, recall of how to perform transfers, sequencing, problem solving and maintaining sustained attention.  Performed a/p transfer bed>w/c at mod A level (+2 for w/c and LE management for safety) but pt demonstrating much improved ability to follow instruction for tasks as well as performing alternating hip scooting.  Once in w/c, pt propelled to/from day room x 100' at S level with min cues for direction.  Remainder of session focused on cognitive retraining with game of "Spot it" progressing to sequencing and problem solving cards based on pictures.  Pt did very well with spot it and problem solving cards, however requires mod cues and increased time for sequencing cards.  Pt propelled back to room and left in w/c with nurse tech in room to assist with bedside commode transfer.  All needs in reach.   Therapy Documentation Precautions:  Precautions Precautions: Fall Precaution Comments: combative at times Required Braces or Orthoses: Other Brace/Splint Cervical Brace: At all times Other Brace/Splint: Right  PRAFO Restrictions Weight Bearing Restrictions: Yes RLE Weight Bearing: Non weight bearing LLE Weight Bearing: Non weight bearing Other Position/Activity Restrictions: ROM BLE ok EXCEPT R ankle   Pain: Pt with no c/o pain during session.    Locomotion : Wheelchair Mobility Distance: 100   See FIM for current functional status  Therapy/Group: Individual Therapy  Vista Deck 09/23/2014, 1:47 PM

## 2014-09-23 NOTE — Progress Notes (Signed)
Speech Language Pathology Daily Session Note  Patient Details  Name: [name] MRN: [id number] Date of Birth: 12-07-1965  Today's Date: 09/23/2014 SLP Individual Time: 1400-1430 SLP Individual Time Calculation (min): 30 min  Short Term Goals: Week 2: SLP Short Term Goal 1 (Week 2): Patient will consume current diet with minimal overt s/s of aspiration and Min A verbal cues for use of swallowing compensatory strategies.  SLP Short Term Goal 2 (Week 2): Patient will sustain attention to a functional task for 2 minutes with Mod A verbal cues for redirection.  SLP Short Term Goal 3 (Week 2): Patient will utilize external memory aids for recall of functional information with Mod A multimodal cues.  SLP Short Term Goal 4 (Week 2): Patient will demonstrate functional problem solving for basic and familiar tasks with Mod A multimodal cues.  SLP Short Term Goal 5 (Week 2): Patient will self-monitor and correct errors with functional tasks with Max A multimodal cues.  SLP Short Term Goal 6 (Week 2): Patient will utilize an increased vocal intensity to maximize speech intelligibility at the sentence level to 90% with Mod I.   Skilled Therapeutic Interventions: Skilled treatment session focused on cognitive goals. SLP facilitated session by providing Max A multimodal cues for sustained attention to a verbal description task for ~60 seconds.  Patient also required Max A multimodal cues for reasoning and awareness with task. Patient utilized an increased vocal intensity with Mod I and was 100% intelligible at the phrase and sentence level. RN made aware of patient's continued fatigue and lethargy. Patient left supine in bed with alarm on and all needs within reach. Continue with current plan of care.    FIM:  Comprehension Comprehension Mode: Auditory Comprehension: 3-Understands basic 50 - 74% of the time/requires cueing 25 - 50%  of the time Expression Expression Mode: Verbal Expression: 3-Expresses  basic 50 - 74% of the time/requires cueing 25 - 50% of the time. Needs to repeat parts of sentences. Social Interaction Social Interaction: 2-Interacts appropriately 25 - 49% of time - Needs frequent redirection. Problem Solving Problem Solving: 2-Solves basic 25 - 49% of the time - needs direction more than half the time to initiate, plan or complete simple activities Memory Memory: 2-Recognizes or recalls 25 - 49% of the time/requires cueing 51 - 75% of the time FIM - Eating Eating Activity: 5: Set-up assist for open containers;5: Supervision/cues  Pain No/Denies Pain   Therapy/Group: Individual Therapy  [name] 09/23/2014, 3:42 PM
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
## 2014-09-23 NOTE — Progress Notes (Signed)
Physical Therapy Session Note  Patient Details  Name: [name] MRN: [id number] Date of Birth: September 11, 1965  Today's Date: 09/23/2014 PT Individual Time: 1610-1640 PT Individual Time Calculation (min): 30 min   Short Term Goals: Week 1:  PT Short Term Goal 1 (Week 1): Patient will sustain attention to functional task x 5 min with mod cues.  PT Short Term Goal 2 (Week 1): Patient will utilize hospital bed functions to perform bed mobility with min A overall.  PT Short Term Goal 3 (Week 1): Patient will transfer bed <> wheelchair with assist of one person.  PT Short Term Goal 4 (Week 1): Patient will consistently recall pressure relief techniques with min cues.  PT Short Term Goal 5 (Week 1): Patient will initiate wheelchair propulsion.   Skilled Therapeutic Interventions/Progress Updates:    Therapeutic Activity: Pt received in w/c in room. Requesting to go back to bed and agreeable to bed exercises. PT instructed patient in A-P transfer from w/c to bed with verbal cues and assist for w/c management/positioning and supervision for A-P transfer. PT provided verbal cues once patient in bed for supine positioning. Pt rolled to R and L for positioning of draw sheet. PT and NT performed total A scoot using draw sheet to slide patient up in bed.  Therapeutic Exercise: Pt performed supine ther-ex in bed. Pt performed 2x10 of quad sets, glute sets, hip abd/add to midline, and SLR. Pt reports pain in R knee with AROM SLR so AAROM performed. Pt positioned to comfort with call bell in reach and needs met. Family entering room as PT exiting.   Therapy Documentation Precautions:  Precautions Precautions: Fall Precaution Comments: combative at times Required Braces or Orthoses: Other Brace/Splint Cervical Brace: At all times Other Brace/Splint: Right PRAFO Restrictions Weight Bearing Restrictions: Yes RLE Weight Bearing: Non weight bearing LLE Weight Bearing: Non weight bearing Other  Position/Activity Restrictions: ROM BLE ok EXCEPT R ankle   Pain: Pain Assessment Pain Assessment: 0-10 Pain Score: 3     See FIM for current functional status  Therapy/Group: Individual Therapy  Earnest Conroy Penven[place]Crew 09/23/2014, 4:49 PM

## 2014-09-23 NOTE — Progress Notes (Signed)
Occupational Therapy Session Note  Patient Details  Name: [name] MRN: [id number] Date of Birth: [birth year]  Today's Date: 09/23/2014 OT Individual Time: 0930-1030 OT Individual Time Calculation (min): 60 min    Short Term Goals: Week 1:  OT Short Term Goal 1 (Week 1): Pt will don UB clothing with setup OT Short Term Goal 2 (Week 1): Pt will thread LB clothing with AE with mod A OT Short Term Goal 3 (Week 1): Pt will transfer to [facility] A/P with max A with 1 person OT Short Term Goal 4 (Week 1): Pt will perform 3/3 grooming at sink with setup  Skilled Therapeutic Interventions/Progress Updates:    At start of OT session, pt and NT reported that pt had just gotten back in bed after completing BSC transfers and pt was exhausted. Pt agreeable to working on LUE neuro re-ed of L shoulder A/AROM and L grasping. Pt then decided to complete UB bathing and dressing with set up.  Pt demonstrated improved L active use during functional activity.  Pt able to hold wash cloth and use arm to turn herself in her bed. Pt did not want to completely change her pants but did doff them to her thighs to wash perineal area. Min A to fully pull over hips to redress.  Pt very sleepy throughout the session, she was able to engage in conversation and maintained attention to conversation well. Pt resting in bed at end of session with all needs met. Bed alarm on.  Therapy Documentation Precautions:  Precautions Precautions: Fall Precaution Comments: combative at times Required Braces or Orthoses: Other Brace/Splint Cervical Brace: At all times Other Brace/Splint: Right PRAFO Restrictions Weight Bearing Restrictions: Yes RLE Weight Bearing: Non weight bearing LLE Weight Bearing: Non weight bearing Other Position/Activity Restrictions: ROM BLE ok EXCEPT R ankle    Vital Signs: Therapy Vitals Temp: 98.7 F (37.1 C) Temp Source: Oral Pulse Rate: 80 Resp: 18 BP: 134/70 mmHg Patient Position (if  appropriate): Lying Oxygen Therapy SpO2: 99 % O2 Device: Not Delivered Pain: Pain Assessment Pain Assessment: 0-10 Pain Score: 2  Pain Type: Surgical pain;Acute pain Pain Location: Leg Pain Orientation: Right Pain Descriptors / Indicators: Aching;Sore Pain Frequency: Intermittent Pain Onset: On-going Pain Intervention(s): Medication (See eMAR) ADL:  See FIM for current functional status  Therapy/Group: Individual Therapy  [place] 09/23/2014, 8:26 AM

## 2014-09-24 ENCOUNTER — Inpatient Hospital Stay (HOSPITAL_COMMUNITY): Payer: Self-pay | Admitting: Occupational Therapy

## 2014-09-24 ENCOUNTER — Inpatient Hospital Stay (HOSPITAL_COMMUNITY): Payer: Self-pay | Admitting: Physical Therapy

## 2014-09-24 ENCOUNTER — Inpatient Hospital Stay (HOSPITAL_COMMUNITY): Payer: Medicaid Other | Admitting: Speech Pathology

## 2014-09-24 ENCOUNTER — Inpatient Hospital Stay (HOSPITAL_COMMUNITY): Payer: Medicaid Other | Admitting: Physical Therapy

## 2014-09-24 LAB — PROTIME-INR
INR: 1.99 — ABNORMAL HIGH (ref 0.00–1.49)
Prothrombin Time: 22.5 seconds — ABNORMAL HIGH (ref 11.6–15.2)

## 2014-09-24 MED ORDER — WARFARIN SODIUM 7.5 MG PO TABS
20.0000 mg | ORAL_TABLET | Freq: Once | ORAL | Status: AC
  Administered 2014-09-24: 20 mg via ORAL
  Filled 2014-09-24: qty 2
  Filled 2014-09-24: qty 1

## 2014-09-24 MED ORDER — QUETIAPINE FUMARATE 50 MG PO TABS
50.0000 mg | ORAL_TABLET | Freq: Every day | ORAL | Status: DC
  Administered 2014-09-24: 50 mg via ORAL
  Filled 2014-09-24: qty 1
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
## 2014-09-24 NOTE — Progress Notes (Signed)
Physical Therapy Weekly Progress Note  Patient Details  Name: [name] MRN: [id number] Date of Birth: 1966-01-29  Beginning of progress report period: September 16, 2014 End of progress report period: September 24, 2014  Today's Date: 09/24/2014 PT Individual Time: 0800-0900 and  1520-1620 PT Individual Time Calculation (min): 60 min and 60 min  Patient has met 4 of 5 short term goals. Pt is slowly progressing with functional mobility requiring less assistance with bed mobility and bed<>chair tranfers, and showing improvements with activity tolerance. Pt demonstrates behaviors consistent with [place] level [place] demonstrating decreased memory, attention, and problem solving however pt requires less cuing for cognitive tasks and participation.  Patient continues to demonstrate the following deficits: bilateral LE weakness, decreased activity tolerance, cognitive deficits and therefore will continue to benefit from skilled PT intervention to enhance overall performance with activity tolerance, balance, ability to compensate for deficits and knowledge of precautions.  Patient progressing toward long term goals..  Continue plan of care.  PT Short Term Goals Week 1:  PT Short Term Goal 1 (Week 1): Patient will sustain attention to functional task x 5 min with mod cues.  PT Short Term Goal 1 - Progress (Week 1): Met PT Short Term Goal 2 (Week 1): Patient will utilize hospital bed functions to perform bed mobility with min A overall.  PT Short Term Goal 2 - Progress (Week 1): Met PT Short Term Goal 3 (Week 1): Patient will transfer bed <> wheelchair with assist of one person.  PT Short Term Goal 3 - Progress (Week 1): Met PT Short Term Goal 4 (Week 1): Patient will consistently recall pressure relief techniques with min cues.  PT Short Term Goal 4 - Progress (Week 1): Partly met PT Short Term Goal 5 (Week 1): Patient will initiate wheelchair propulsion.  PT Short Term Goal 5 - Progress (Week 1):  Met Week 2:  PT Short Term Goal 1 (Week 2): pt will sustain functional task for 20 min with min cues PT Short Term Goal 2 (Week 2): pt will propel wheelchair in home enviornment x 50 feet PT Short Term Goal 3 (Week 2): pt will perform car transfer with mod A PT Short Term Goal 4 (Week 2): pt will perform A/P tansfer bed<>chair with min A  Skilled Therapeutic Interventions/Progress Updates:  Session 1 focused on functional mobility, LE strengthening, and problem solving. Pt performed bed mobility with min A for R LE. Pt performed A/P transfers X 3 bed<>wheelchair with armrests, requiring mod A, pt progressed to one person assist with scoot and LE's while pt able to manage wheelchair with cuing for technique and sequence. Pt performed supine therex X 12 bilat AAROM hip abd/add, heel slides, SLR and AROM quad sets, SAQ's, and SLR flexion.) Pt propelled wheelchair with supervision X 150 ft. Pt attempted problem solving task of doubling recipe and could verbalize answer however could not answer in writing. Pt left in room with all needs in reach in wheelchair with quick release belt on.  Session 2 focused on memory and functional mobility. Pt performed A/P transfers X 2 bed<>chair as listed above, and wheelchair propulsion as listed above. Pt performed memory task of identifying cards placed in different orders X 15 min with mod cues and repeat demonstrations, and overall 50% accuracy. Pt reported wanting to go outside and was transported outside in wheelchair where pt propelled wheelchair over brick, sidewalk, and door thresholds requiring supervision and mod cues for technique, for steering to stay on sidewalk, and for  object avoidance. Pt returned to room and left lying in bed with bed alarm set, 3 rails up, and all needs in reach.  Therapy Documentation Precautions:  Precautions Precautions: Fall Precaution Comments: combative at times Required Braces or Orthoses: Other Brace/Splint Cervical Brace:  At all times Other Brace/Splint: Right PRAFO Restrictions Weight Bearing Restrictions: Yes RLE Weight Bearing: Non weight bearing LLE Weight Bearing: Non weight bearing Other Position/Activity Restrictions: ROM BLE ok EXCEPT R ankle Pain: Pain Assessment Pain Assessment: No/denies pain Faces Pain Scale: Hurts a little bit   Function:  Toileting Toileting       Bed Mobility Roll left and right activity   Assist level: Supervision or verbal cues  Sit to lying activity   Assist level: Supervision or verbal cues  Lying to sitting activity   Assist level: Touching or steadying assistance (Pt > 75%, lift 1 leg)  Mobility details Bed mobility details: Verbal cues for sequencing   Transfers Sit to stand transfer        Chair/bed transfer   Chair/bed transfer method: Anterior/posterior Chair/bed transfer assist level: Moderate assist (Pt 50 - 74%/lift or lower) Chair/bed transfer assistive device: Armrests   Chair/bed transfer details: Verbal cues for sequencing;Verbal cues for technique   Toilet transfer        Car transfer          Locomotion Ambulation          Walk 10 feet activity      Walk 50 feet with 2 turns activity      Walk 150 feet activity      Walk 10 feet on uneven surfaces activity      Stairs          Walk up/down 1 step activity        Walk up/down 4 steps activity      Walk up/down 12 steps activity      Pick up small objects from floor      Wheelchair   Type: Manual Max wheelchair distance: 150 ft Assist Level: Supervision or verbal cues  Wheel 50 feet with 2 turns activity   Assist Level: Supervision or verbal cues  Wheel 150 feet activity   Assist Level: Supervision or verbal cues   Cognition Comprehension Comprehension assist level: Understands basic 50 - 74% of the time/ requires cueing 25 - 49% of the time  Expression Expression assist level: Expresses basic 75 - 89% of the time/requires cueing 10 - 24% of the time.  Needs helper to occlude trach/needs to repeat words.  Social Interaction Social Interaction assist level: Interacts appropriately 50 - 74% of the time - May be physically or verbally inappropriate.  Problem Solving Problem solving assist level: Solves basic 25 - 49% of the time - needs direction more than half the time to initiate, plan or complete simple activities  Memory Memory assist level: Recognizes or recalls 25 - 49% of the time/requires cueing 50 - 75% of the time    Therapy/Group: Individual Therapy  Elsie Ra 09/24/2014, 4:46 PM
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
## 2014-09-24 NOTE — Progress Notes (Signed)
Occupational Therapy Session Note  Patient Details  Name: [name] MRN: [id number] Date of Birth: 03-01-1965  Today's Date: 09/24/2014 OT Individual Time: 1000-1100 OT Individual Time Calculation (min): 60 min    Short Term Goals: Week 1:  OT Short Term Goal 1 (Week 1): Pt will don UB clothing with setup OT Short Term Goal 2 (Week 1): Pt will thread LB clothing with AE with mod A OT Short Term Goal 3 (Week 1): Pt will transfer to [facility] A/P with max A with 1 person OT Short Term Goal 4 (Week 1): Pt will perform 3/3 grooming at sink with setup  Skilled Therapeutic Interventions/Progress Updates:    Pt seen for OT therapy session focusing on functional transfers and mobility. Pt in w/c upon arrival, voicing desire to complete toileting task. Completed toilet transfer to [facility] with +2 assist via anterior/posterior method with assist for B LEs and max VCs for technique/ sequencing. +3 assist required to return to w/c via posterior scoot due to fatigue and transferring to uneven surface. Assist required for lateral leans to remove LB clothing. Pt attempted hygiene, however, assist required for thoroughness on BSC. Grooming task completed while pt seated on BSC. Pt demonstrated good functional sitting balance and endurance, able to sit unsupported for ~20 min. At end of session, pt returned to supine where she was able to complete hygiene with set-up. Pt donned night gown with set-up from bed level. Pt required multiple cues for re-orientation to time of day. She was oriented to year and month using wall calendar without cues.  Therapy Documentation Precautions:  Precautions Precautions: Fall Precaution Comments: combative at times Required Braces or Orthoses: Other Brace/Splint Cervical Brace: At all times Other Brace/Splint: Right PRAFO Restrictions Weight Bearing Restrictions: Yes RLE Weight Bearing: Non weight bearing LLE Weight Bearing: Non weight bearing Other Position/Activity  Restrictions: ROM BLE ok EXCEPT R ankle Pain: Pain Assessment Pain Assessment: No/denies pain  Function:   Eating Eating               Grooming Oral Care,Brush Teeth, Clean Dentures Activity:             Wash, Rinse, Dry Face Activity   Assist Level: Set up   Set up :  (To obtain items)  Wash, Rinse, Dry Hands Activity          Brush, Comb Hair Activity        Shave Activity          Apply Makeup Activity                                                             Bathing Bathing position      Bathing parts      Bathing assist         Upper Body Dressing/Undressing Upper body dressing   What is the patient wearing?: Pull over shirt/dress     Pull over shirt/dress - Perfomed by patient: Thread/unthread right sleeve;Thread/unthread left sleeve;Put head through opening;Pull shirt over trunk          Upper body assist Assist Level: Set up   Set up : To obtain clothing/put away   Lower Body Dressing/Undressing Lower body dressing Lower body dressing/undressing activity did not occur: N/A (Wore long gown)  Lower body assist         Toileting Toileting     Toileting steps completed by helper: Adjust clothing prior to toileting;Performs perineal hygiene;Adjust clothing after toileting    Toileting assist Assist level: Two helpers    Bed Mobility Roll left and right activity   Assist level: Touching or steadying assistance (Pt > 75%)  Sit to lying activity   Assist level: Supervision or verbal cues  Lying to sitting activity   Assist level: Touching or steadying assistance (Pt > 75%)  Mobility details Bed mobility details: Verbal cues for sequencing   Transfers Sit to stand transfer Sit to stand activity did not occur: Safety/medical concerns      Chair/bed transfer   Chair/bed transfer method: Anterior/posterior Chair/bed transfer assist level: 2 helpers Chair/bed transfer assistive  device: Armrests   Chair/bed transfer details: Verbal cues for sequencing;Verbal cues for technique  Toilet transfer                Tub/shower transfer             Cognition Comprehension Comprehension assist level: Understands basic 50 - 74% of the time/ requires cueing 25 - 49% of the time  Expression Expression assist level: Expresses basic 75 - 89% of the time/requires cueing 10 - 24% of the time. Needs helper to occlude trach/needs to repeat words.  Social Interaction Social Interaction assist level: Interacts appropriately 90% of the time - Needs monitoring or encouragement for participation or interaction.  Problem Solving Problem solving assist level: Solves basic 50 - 74% of the time/requires cueing 25 - 49% of the time  Memory Memory assist level: Recognizes or recalls 50 - 74% of the time/requires cueing 25 - 49% of the time    Therapy/Group: Individual Therapy  Lewis, Unice Vantassel C 09/24/2014, 12:43 PM
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
## 2014-09-24 NOTE — Progress Notes (Signed)
Speech Language Pathology Daily Session Note  Patient Details  Name: [name] MRN: [id number] Date of Birth: 01-01-1966  Today's Date: 09/24/2014  Session 1: SLP Individual Time: 1100-1200 SLP Individual Time Calculation (min): 60 min   Session 2: SLP Individual Time: 4098-1191 SLP Individual Time Calculation (min):25 min  Short Term Goals: Week 2: SLP Short Term Goal 1 (Week 2): Patient will consume current diet with minimal overt s/s of aspiration and Min A verbal cues for use of swallowing compensatory strategies.  SLP Short Term Goal 2 (Week 2): Patient will sustain attention to a functional task for 2 minutes with Mod A verbal cues for redirection.  SLP Short Term Goal 3 (Week 2): Patient will utilize external memory aids for recall of functional information with Mod A multimodal cues.  SLP Short Term Goal 4 (Week 2): Patient will demonstrate functional problem solving for basic and familiar tasks with Mod A multimodal cues.  SLP Short Term Goal 5 (Week 2): Patient will self-monitor and correct errors with functional tasks with Max A multimodal cues.  SLP Short Term Goal 6 (Week 2): Patient will utilize an increased vocal intensity to maximize speech intelligibility at the sentence level to 90% with Mod I.   Skilled Therapeutic Interventions:  Session 1: Skilled treatment session focused on cognitive goals. Upon arrival, patient was asleep while supine in bed but was easily awakened.  SLP facilitated session by providing Min-Mod A question cues for recall of events from previous therapy sessions and Mod A verbal cues for sustained attention to functional tasks and conversation. Patient demonstrated minimally increased intellectual awareness of cognitive deficits and utilized external memory aids to orient to time with Mod I. Patient also consumed lunch meal of Dys. 3 textures with thin liquids without overt s/s of aspiration and required Mod A verbal cues to attend to self-feeding and  for use of swallowing compensatory strategies.  Patient left supine in bed with all needs within reach. Continue with current plan of care.    Session 2: Skilled treatment session focused on cognitive goals. Upon arrival, patient was asleep while supine in bed but was easily awakened.  SLP facilitated session by providing Max A multimodal cues for sustained attention for ~60 seconds and Max A for directing care in regards to transfer from bed to wheelchair and for functional problem solving in regards to postioning and requesting assistance. Patient left supine in bed with all needs within reach. Continue with current plan of care.   Function:  Eating Eating   Modified Consistency Diet: Yes Eating Assist Level: Supervision or verbal cues;Set up assist for;More than reasonable amount of time   Eating Set Up Assist For: Opening containers       Cognition Comprehension Comprehension assist level: Understands basic 50 - 74% of the time/ requires cueing 25 - 49% of the time  Expression   Expression assist level: Expresses basic 75 - 89% of the time/requires cueing 10 - 24% of the time. Needs helper to occlude trach/needs to repeat words.  Social Interaction Social Interaction assist level: Interacts appropriately 50 - 74% of the time - May be physically or verbally inappropriate.  Problem Solving Problem solving assist level: Solves basic 25 - 49% of the time - needs direction more than half the time to initiate, plan or complete simple activities  Memory Memory assist level: Recognizes or recalls 25 - 49% of the time/requires cueing 50 - 75% of the time    Pain Pain Assessment Pain Assessment: No/denies  pain  Therapy/Group: Individual Therapy  Cavion Faiola 09/24/2014, 3:11 PM

## 2014-09-25 ENCOUNTER — Encounter (HOSPITAL_COMMUNITY): Payer: Self-pay

## 2014-09-25 ENCOUNTER — Inpatient Hospital Stay (HOSPITAL_COMMUNITY): Payer: Medicaid Other | Admitting: Physical Therapy

## 2014-09-25 ENCOUNTER — Inpatient Hospital Stay (HOSPITAL_COMMUNITY): Payer: Medicaid Other | Admitting: Speech Pathology

## 2014-09-25 ENCOUNTER — Inpatient Hospital Stay (HOSPITAL_COMMUNITY): Payer: Self-pay | Admitting: Physical Therapy

## 2014-09-25 DIAGNOSIS — S8291XS Unspecified fracture of right lower leg, sequela: Secondary | ICD-10-CM

## 2014-09-25 DIAGNOSIS — S069X2A Unspecified intracranial injury with loss of consciousness of 31 minutes to 59 minutes, initial encounter: Secondary | ICD-10-CM

## 2014-09-25 LAB — PROTIME-INR
INR: 2.04 — ABNORMAL HIGH (ref 0.00–1.49)
Prothrombin Time: 22.9 seconds — ABNORMAL HIGH (ref 11.6–15.2)

## 2014-09-25 MED ORDER — QUETIAPINE FUMARATE 50 MG PO TABS
50.0000 mg | ORAL_TABLET | Freq: Every day | ORAL | Status: DC
  Administered 2014-09-26 – 2014-10-24 (×29): 50 mg via ORAL
  Filled 2014-09-25 (×22): qty 1
  Filled 2014-09-25: qty 2
  Filled 2014-09-25 (×6): qty 1

## 2014-09-25 MED ORDER — WARFARIN SODIUM 7.5 MG PO TABS
20.0000 mg | ORAL_TABLET | Freq: Once | ORAL | Status: AC
  Administered 2014-09-25: 20 mg via ORAL
  Filled 2014-09-25: qty 1
  Filled 2014-09-25: qty 2

## 2014-09-25 NOTE — Discharge Instructions (Addendum)
Inpatient Rehab Discharge Instructions  [name] Discharge date and time: No discharge date for patient encounter.   Activities/Precautions/ Functional Status: Activity: Nonweightbearing bilateral lower extremities Diet:  Wound Care: keep wound clean and dry Functional status:  ___ No restrictions     ___ Walk up steps independently _x__ 24/7 supervision/assistance   ___ Walk up steps with assistance ___ Intermittent supervision/assistance  ___ Bathe/dress independently ___ Walk with walker     ___ Bathe/dress with assistance ___ Walk Independently    ___ Shower independently ___ Walk with assistance    ___ Shower with assistance ___ No alcohol     ___ Return to work/school ________  Special Instructions:    My questions have been answered and I understand these instructions. I will adhere to these goals and the provided educational materials after my discharge from the hospital.  Patient/Caregiver Signature _______________________________ Date __________  Clinician Signature _______________________________________ Date __________  Please bring this form and your medication list with you to all your follow-up doctor's appointments.   Information on my medicine - Coumadin   (Warfarin)  This medication education was reviewed with me or my healthcare representative as part of my discharge preparation.  The pharmacist that spoke with me during my hospital stay was:  Riki Rusk, [facility]  Why was Coumadin prescribed for you? Coumadin was prescribed for you because you have a blood clot or a medical condition that can cause an increased risk of forming blood clots. Blood clots can cause serious health problems by blocking the flow of blood to the heart, lung, or brain. Coumadin can prevent harmful blood clots from forming. As a reminder your indication for Coumadin is:   Blood Clot Prevention After Orthopedic Surgery  What test will check on my response to Coumadin? While on  Coumadin (warfarin) you will need to have an INR test regularly to ensure that your dose is keeping you in the desired range. The INR (international normalized ratio) number is calculated from the result of the laboratory test called prothrombin time (PT).  If an INR APPOINTMENT HAS NOT ALREADY BEEN MADE FOR YOU please schedule an appointment to have this lab work done by your health care provider within 7 days. Your INR goal is usually a number between:  2 to 3 or your provider may give you a more narrow range like 2-2.5.  Ask your health care provider during an office visit what your goal INR is.  What  do you need to  know  About  COUMADIN? Take Coumadin (warfarin) exactly as prescribed by your healthcare provider about the same time each day.  DO NOT stop taking without talking to the doctor who prescribed the medication.  Stopping without other blood clot prevention medication to take the place of Coumadin may increase your risk of developing a new clot or stroke.  Get refills before you run out.  What do you do if you miss a dose? If you miss a dose, take it as soon as you remember on the same day then continue your regularly scheduled regimen the next day.  Do not take two doses of Coumadin at the same time.  Important Safety Information A possible side effect of Coumadin (Warfarin) is an increased risk of bleeding. You should call your healthcare provider right away if you experience any of the following: ? Bleeding from an injury or your nose that does not stop. ? Unusual colored urine (red or dark brown) or unusual colored stools (red or  black). ? Unusual bruising for unknown reasons. ? A serious fall or if you hit your head (even if there is no bleeding).  Some foods or medicines interact with Coumadin (warfarin) and might alter your response to warfarin. To help avoid this: ? Eat a balanced diet, maintaining a consistent amount of Vitamin K. ? Notify your provider about major diet  changes you plan to make. ? Avoid alcohol or limit your intake to 1 drink for women and 2 drinks for men per day. (1 drink is 5 oz. wine, 12 oz. beer, or 1.5 oz. liquor.)  Make sure that ANY health care provider who prescribes medication for you knows that you are taking Coumadin (warfarin).  Also make sure the healthcare provider who is monitoring your Coumadin knows when you have started a new medication including herbals and non-prescription products.  Coumadin (Warfarin)  Major Drug Interactions  Increased Warfarin Effect Decreased Warfarin Effect  Alcohol (large quantities) Antibiotics (esp. Septra/Bactrim, Flagyl, Cipro) Amiodarone (Cordarone) Aspirin (ASA) Cimetidine (Tagamet) Megestrol (Megace) NSAIDs (ibuprofen, naproxen, etc.) Piroxicam (Feldene) Propafenone (Rythmol SR) Propranolol (Inderal) Isoniazid (INH) Posaconazole (Noxafil) Barbiturates (Phenobarbital) Carbamazepine (Tegretol) Chlordiazepoxide (Librium) Cholestyramine (Questran) Griseofulvin Oral Contraceptives Rifampin Sucralfate (Carafate) Vitamin K   Coumadin (Warfarin) Major Herbal Interactions  Increased Warfarin Effect Decreased Warfarin Effect  Garlic Ginseng Ginkgo biloba Coenzyme Q10 Green tea St. Johns wort    Coumadin (Warfarin) FOOD Interactions  Eat a consistent number of servings per week of foods HIGH in Vitamin K (1 serving =  cup)  Collards (cooked, or boiled & drained) Kale (cooked, or boiled & drained) Mustard greens (cooked, or boiled & drained) Parsley *serving size only =  cup Spinach (cooked, or boiled & drained) Swiss chard (cooked, or boiled & drained) Turnip greens (cooked, or boiled & drained)  Eat a consistent number of servings per week of foods MEDIUM-HIGH in Vitamin K (1 serving = 1 cup)  Asparagus (cooked, or boiled & drained) Broccoli (cooked, boiled & drained, or raw & chopped) Brussel sprouts (cooked, or boiled & drained) *serving size only =  cup Lettuce,  raw (green leaf, endive, romaine) Spinach, raw Turnip greens, raw & chopped   These websites have more information on Coumadin (warfarin):  http://www.king-russell.com/; https://www.hines.net/;
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
## 2014-09-25 NOTE — Progress Notes (Signed)
Occupational Therapy Session Note  Patient Details  Name: Carnelia Oscar MRN: [id number] Date of Birth: 16-Nov-1965  Today's Date: 09/25/2014 OT Individual Time: 1103-1200 OT Individual Time Calculation (min): 57 min    Short Term Goals: Week 1:  OT Short Term Goal 1 (Week 1): Pt will don UB clothing with setup OT Short Term Goal 2 (Week 1): Pt will thread LB clothing with AE with mod A OT Short Term Goal 3 (Week 1): Pt will transfer to [facility] A/P with max A with 1 person OT Short Term Goal 4 (Week 1): Pt will perform 3/3 grooming at sink with setup  Skilled Therapeutic Interventions/Progress Updates:    Upon entering the room, pt requesting toileting. Pt performed anterior posterior transfer with therapist setting up all equipment. Pt required +2 assist for safe transfer. Pt able to successfully void and have BM this session. Pt engaged in lateral leans with assist for balance for LB clothing management this session.  Pt able to pull pants up slightly but overall requiring assist for task. Pt able to posteriorly back self back into wheelchair with +2 assist again for safety. Pt required min - mod verbal cues for initiation, attention, and sequencing of tasks this session. Once in wheelchair pt engaged in grooming at sink. Once completed, pt remained in wheelchair with quick release belt donned and call bell and all needed items within reach upon exiting the room. Pt oriented to self, situation, and date/year with increased time.  Therapy Documentation Precautions:  Precautions Precautions: Fall Precaution Comments: combative at times Required Braces or Orthoses: Other Brace/Splint Cervical Brace: At all times Other Brace/Splint: Right PRAFO Restrictions Weight Bearing Restrictions: Yes RLE Weight Bearing: Non weight bearing LLE Weight Bearing: Non weight bearing Other Position/Activity Restrictions: ROM BLE ok EXCEPT R ankle General:   Vital Signs: Therapy Vitals Temp: 98.4 F (36.9  C) Temp Source: Oral Pulse Rate: 81 Resp: 18 BP: (!) 151/86 mmHg Patient Position (if appropriate): Sitting Oxygen Therapy SpO2: 100 % O2 Device: Not Delivered Pain: Pain Assessment Pain Assessment: No/denies pain  Function:   Eating Eating   Eating Assist Level: More than reasonable amount of time;Set up assist for;Supervision or verbal cues   Eating Set Up Assist For: Opening containers       Grooming Oral Care,Brush Teeth, Clean Dentures Activity:      Assist Level: Supervision or verbal cues;Set up      Wash, Rinse, Dry Face Activity   Assist Level: Supervision or verbal cues;Set up   Set up : To obtain items  Wash, Rinse, Dry Hands Activity   Assist Level: Set up;Supervision or verbal cues   Set up : To adjust water temperature  Brush, Comb Hair Activity Brush,comb hair activity did not occur: N/A      Shave Activity   Assist Level: Helper performed activity      Apply Makeup Activity                                                             Bathing Bathing position   Position: Wheelchair/chair at sink  Bathing parts Body parts bathed by patient: Right arm;Left arm;Chest;Abdomen Body parts bathed by helper: Back  Bathing assist Assist Level: Supervision or verbal cues       Upper Body Dressing/Undressing Upper  body dressing   What is the patient wearing?: Pull over shirt/dress     Pull over shirt/dress - Perfomed by patient: Thread/unthread right sleeve;Thread/unthread left sleeve;Put head through opening;Pull shirt over trunk          Upper body assist Assist Level: Set up   Set up : To obtain clothing/put away   Lower Body Dressing/Undressing Lower body dressing                                  Lower body assist         Toileting Toileting     Toileting steps completed by helper: Adjust clothing prior to toileting;Performs perineal hygiene;Adjust clothing after toileting    Toileting assist Assist level: Two  helpers    Bed Mobility Roll left and right activity      Sit to lying activity      Lying to sitting activity      Mobility details     Transfers Sit to stand transfer        Chair/bed transfer              Toilet transfer   Toilet transfer assistive device: Bedside commode (anterior posterior transfer) Assist level to bedside commode (at bedside): 2 helpers Assist level from bedside commode (at bedside): 2 helpers        Tub/shower transfer             Cognition Comprehension Comprehension assist level: Understands basic 50 - 74% of the time/ requires cueing 25 - 49% of the time  Expression Expression assist level: Expresses basic 75 - 89% of the time/requires cueing 10 - 24% of the time. Needs helper to occlude trach/needs to repeat words.  Social Interaction Social Interaction assist level: Interacts appropriately 75 - 89% of the time - Needs redirection for appropriate language or to initiate interaction.  Problem Solving Problem solving assist level: Solves basic 25 - 49% of the time - needs direction more than half the time to initiate, plan or complete simple activities  Memory Memory assist level: Recognizes or recalls 25 - 49% of the time/requires cueing 50 - 75% of the time    Therapy/Group: Individual Therapy  Lowella Grip 09/25/2014, 5:13 PM

## 2014-09-25 NOTE — Progress Notes (Signed)
Speech Language Pathology Daily Session Notes  Patient Details  Name: [name] MRN: [id number] Date of Birth: 1965-07-04  Today's Date: 09/25/2014  Session 1: SLP Individual Time: 0900-1000 SLP Individual Time Calculation (min): 60 min  Session 2: SLP Individual Time: 1500-1530 SLP Individual Time Calculation (min): 30 min  Short Term Goals: Week 2: SLP Short Term Goal 1 (Week 2): Patient will consume current diet with minimal overt s/s of aspiration and Min A verbal cues for use of swallowing compensatory strategies.  SLP Short Term Goal 2 (Week 2): Patient will sustain attention to a functional task for 2 minutes with Mod A verbal cues for redirection.  SLP Short Term Goal 3 (Week 2): Patient will utilize external memory aids for recall of functional information with Mod A multimodal cues.  SLP Short Term Goal 4 (Week 2): Patient will demonstrate functional problem solving for basic and familiar tasks with Mod A multimodal cues.  SLP Short Term Goal 5 (Week 2): Patient will self-monitor and correct errors with functional tasks with Max A multimodal cues.  SLP Short Term Goal 6 (Week 2): Patient will utilize an increased vocal intensity to maximize speech intelligibility at the sentence level to 90% with Mod I.   Skilled Therapeutic Interventions:  Session 1: Skilled treatment session focused on dysphagia and cognitive goals. SLP facilitated session by providing skilled observation with breakfast meal of Dys. 3 textures with thin liquids. Patient did not demonstrate any overt s/s of aspiration and required Mod A verbal cues for use of small bites. Patient attended to self-feeding in a moderately distracting environment for 45 minutes with Mod A verbal cues for redirection.  SLP also facilitated session by providing Mod A multimodal cues for recall of the patient's current medications and their functions and for functional problem solving/organization with a 4 time per day pill box. Patient  left upright in wheelchair with quick release belt in place and all needs within reach. Continue with current plan of care.    Session 2: Skilled treatment session focused on cognitive goals. Upon arrival, patient was asleep while upright in wheelchair but easily awakened to voice. SLP facilitated session by providing Min A verbal cues for selective attention for 30 minutes in a mildly distracting environment. Patient also required Min A verbal cues and extra time for functional problem solving with a basic money mangement task. Patient left upright in wheelchair with quick release belt in place and all needs within reach. Continue with current plan of care.   Function:  Eating Eating   Modified Consistency Diet: Yes Eating Assist Level: More than reasonable amount of time;Set up assist for;Supervision or verbal cues   Eating Set Up Assist For: Opening containers       Cognition Comprehension Comprehension assist level: Understands basic 50 - 74% of the time/ requires cueing 25 - 49% of the time  Expression   Expression assist level: Expresses basic 75 - 89% of the time/requires cueing 10 - 24% of the time. Needs helper to occlude trach/needs to repeat words.  Social Interaction Social Interaction assist level: Interacts appropriately 75 - 89% of the time - Needs redirection for appropriate language or to initiate interaction.  Problem Solving Problem solving assist level: Solves basic 25 - 49% of the time - needs direction more than half the time to initiate, plan or complete simple activities  Memory Memory assist level: Recognizes or recalls 25 - 49% of the time/requires cueing 50 - 75% of the time  Pain Pain Assessment Pain Assessment: No/denies pain  Therapy/Group: Individual Therapy  Osie Amparo 09/25/2014, 4:09 PM

## 2014-09-25 NOTE — Progress Notes (Signed)
Physical Therapy Session Note  Patient Details  Name: [name] MRN: [id number] Date of Birth: 05/31/1965  Today's Date: 09/25/2014 PT Individual Time: 0815-0900 and 1300-1400 PT Individual Time Calculation (min): 45 min and 60 min  Short Term Goals: Week 2:  PT Short Term Goal 1 (Week 2): pt will sustain functional task for 20 min with min cues PT Short Term Goal 2 (Week 2): pt will propel wheelchair in home enviornment x 50 feet PT Short Term Goal 3 (Week 2): pt will perform car transfer with mod A PT Short Term Goal 4 (Week 2): pt will perform A/P tansfer bed<>chair with min A  Skilled Therapeutic Interventions/Progress Updates:  Session 1 focused on functional mobility and LE strengthening. Pt seemed more lethargic for session, demonstrating decreased arousal and sustained attention for bed mobility and lower body dressing. Missed 15 min due to pt not wanting PT to assist with dressing despite questioning cues suggesting assistance from nurse, instead pt reported she was "positive I can do it on my own." Pt finally agreed to let female PT assist with lower body dressing. Pt performed bed<>chair transfer with mod assist to even surface, max assist to elevated surface ADL apartment bed, and min assist to lowered surface from ADL apartment bed,using armrests on wheelchair and mod cuing for sequence and technique. Pt performed supine therex AROM bilat quad sets, and L LE heelslides X 15, and AAROM bilat hip abd/add, SLR, and R heel slides X 15. Pt returned to room and left sitting in chair with all needs in reach awaiting breakfast.  Session 2 focused on problem solving and wheelchair management. Pt propelled wheelchair X 200 feet including 50 feet over carpet to simulate home environment. Pt completed problem solving task started in previous sessions of doubling recipe improving accuracy to 75% with mod cuing to repeat instructions, provide examples and initiate reasoning. Pt declined car  transfer stating that she was "too tired" and to plan to perform next session. Pt instructed in managing wheelchair leg rests and needed mod A with LE's and min A for sequence and technique requiring max multimodal cues. Pt returned to room and positioned reclined in wheelchair with all needs in reach and quick release belt on.  Therapy Documentation Precautions:  Precautions Precautions: Fall Precaution Comments: combative at times Required Braces or Orthoses: Other Brace/Splint Cervical Brace: At all times Other Brace/Splint: Right PRAFO Restrictions Weight Bearing Restrictions: Yes RLE Weight Bearing: Non weight bearing LLE Weight Bearing: Non weight bearing Other Position/Activity Restrictions: ROM BLE ok EXCEPT R ankle Pain: Pain Assessment Pain Assessment: Faces Faces Pain Scale: Hurts little more Pain Type: Acute pain Pain Location: Leg Pain Orientation: Right;Left Pain Descriptors / Indicators: Aching Pain Onset: On-going Pain Intervention(s): Repositioned;Emotional support   Function:  Toileting Toileting       Bed Mobility Roll left and right activity   Assist level: Touching or steadying assistance (Pt > 75%, lift 1 leg)  Sit to lying activity   Assist level: Touching or steadying assistance (Pt > 75%, lift 1 leg)  Lying to sitting activity   Assist level: Touching or steadying assistance (Pt > 75%, lift 1 leg)  Mobility details Bed mobility details: Verbal cues for sequencing   Transfers Sit to stand transfer        Chair/bed transfer   Chair/bed transfer method: Anterior/posterior Chair/bed transfer assist level: Maximal assist (Pt 25 - 49%/lift and lower) Chair/bed transfer assistive device: Armrests   Chair/bed transfer details: Verbal cues for sequencing;Verbal cues  for Web designer          Walk 10 feet activity      Walk 50 feet with 2 turns activity      Walk 150  feet activity      Walk 10 feet on uneven surfaces activity      Stairs          Walk up/down 1 step activity        Walk up/down 4 steps activity      Walk up/down 12 steps activity      Pick up small objects from floor      Wheelchair   Type: Manual Max wheelchair distance: 100 ft Assist Level: Supervision or verbal cues  Wheel 50 feet with 2 turns activity   Assist Level: Supervision or verbal cues  Wheel 150 feet activity       Cognition Comprehension Comprehension assist level: Understands basic 50 - 74% of the time/ requires cueing 25 - 49% of the time  Expression Expression assist level: Expresses basic 75 - 89% of the time/requires cueing 10 - 24% of the time. Needs helper to occlude trach/needs to repeat words.  Social Interaction Social Interaction assist level: Interacts appropriately 90% of the time - Needs monitoring or encouragement for participation or interaction.  Problem Solving Problem solving assist level: Solves basic 25 - 49% of the time - needs direction more than half the time to initiate, plan or complete simple activities  Memory Memory assist level: Recognizes or recalls 25 - 49% of the time/requires cueing 50 - 75% of the time    Therapy/Group: Individual Therapy  [name] 09/25/2014, 12:39 PM
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
## 2014-09-26 ENCOUNTER — Inpatient Hospital Stay (HOSPITAL_COMMUNITY): Payer: Medicaid Other | Admitting: Speech Pathology

## 2014-09-26 ENCOUNTER — Inpatient Hospital Stay (HOSPITAL_COMMUNITY): Payer: Medicaid Other

## 2014-09-26 ENCOUNTER — Inpatient Hospital Stay (HOSPITAL_COMMUNITY): Payer: Medicaid Other | Admitting: Occupational Therapy

## 2014-09-26 ENCOUNTER — Inpatient Hospital Stay (HOSPITAL_COMMUNITY): Payer: Medicaid Other | Admitting: Physical Therapy

## 2014-09-26 LAB — URINALYSIS, ROUTINE W REFLEX MICROSCOPIC
Bilirubin Urine: NEGATIVE
Glucose, UA: NEGATIVE mg/dL
Hgb urine dipstick: NEGATIVE
Ketones, ur: 15 mg/dL — AB
Nitrite: POSITIVE — AB
Protein, ur: NEGATIVE mg/dL
Specific Gravity, Urine: 1.018 (ref 1.005–1.030)
Urobilinogen, UA: 0.2 mg/dL (ref 0.0–1.0)
pH: 6 (ref 5.0–8.0)

## 2014-09-26 LAB — COMPREHENSIVE METABOLIC PANEL
ALT: 30 U/L (ref 14–54)
AST: 32 U/L (ref 15–41)
Albumin: 2.4 g/dL — ABNORMAL LOW (ref 3.5–5.0)
Alkaline Phosphatase: 101 U/L (ref 38–126)
Anion gap: 7 (ref 5–15)
BUN: 6 mg/dL (ref 6–20)
CO2: 28 mmol/L (ref 22–32)
Calcium: 9.2 mg/dL (ref 8.9–10.3)
Chloride: 105 mmol/L (ref 101–111)
Creatinine, Ser: 0.65 mg/dL (ref 0.44–1.00)
GFR calc Af Amer: >60 mL/min (ref >60)
GFR calc non Af Amer: >60 mL/min (ref >60)
Glucose, Bld: 91 mg/dL (ref 65–99)
Potassium: 3.8 mmol/L (ref 3.5–5.1)
Sodium: 140 mmol/L (ref 135–145)
Total Bilirubin: 0.8 mg/dL (ref 0.3–1.2)
Total Protein: 6.2 g/dL — ABNORMAL LOW (ref 6.5–8.1)

## 2014-09-26 LAB — CBC
HCT: 36.1 % (ref 36.0–46.0)
Hemoglobin: 11.2 g/dL — ABNORMAL LOW (ref 12.0–15.0)
MCH: 28.6 pg (ref 26.0–34.0)
MCHC: 31 g/dL (ref 30.0–36.0)
MCV: 92.1 fL (ref 78.0–100.0)
Platelets: 271 10*3/uL (ref 150–400)
RBC: 3.92 MIL/uL (ref 3.87–5.11)
RDW: 15.8 % — ABNORMAL HIGH (ref 11.5–15.5)
WBC: 5 10*3/uL (ref 4.0–10.5)

## 2014-09-26 LAB — URINE MICROSCOPIC-ADD ON

## 2014-09-26 LAB — PROTIME-INR
INR: 2.16 — ABNORMAL HIGH (ref 0.00–1.49)
Prothrombin Time: 23.9 seconds — ABNORMAL HIGH (ref 11.6–15.2)

## 2014-09-26 LAB — VALPROIC ACID LEVEL: Valproic Acid Lvl: 65 ug/mL (ref 50.0–100.0)

## 2014-09-26 MED ORDER — WARFARIN SODIUM 7.5 MG PO TABS
20.0000 mg | ORAL_TABLET | Freq: Every day | ORAL | Status: DC
  Administered 2014-09-26: 20 mg via ORAL
  Filled 2014-09-26: qty 2
  Filled 2014-09-26: qty 1

## 2014-09-26 NOTE — Progress Notes (Signed)
Occupational Therapy Weekly Progress Note  Patient Details  Name: [name] MRN: [id number] Date of Birth: 02/03/1966  Beginning of progress report period: September 17, 2014 End of progress report period: September 26, 2014  Today's Date: 09/26/2014 OT Individual Time: 9741-6384 OT Individual Time Calculation (min): 57 min    Patient has met 3 of 4 short term goals.  Pt has made excellent progress with her sitting balance, endurance,  Bed mobility, memory, attention to enable her to complete self care tasks and transfers more independently.  Patient continues to demonstrate the following deficits: non weight bearing in BLE to limit transfer options, RLE pain with leg in dependent position, decreased activity tolerance, decreased problem solving and therefore will continue to benefit from skilled OT intervention to enhance overall performance with BADL.  Patient progressing toward long term goals..  Continue plan of care.  OT Short Term Goals Week 1:  OT Short Term Goal 1 (Week 1): Pt will don UB clothing with setup OT Short Term Goal 1 - Progress (Week 1): Met OT Short Term Goal 2 (Week 1): Pt will thread LB clothing with AE with mod A OT Short Term Goal 2 - Progress (Week 1): Met OT Short Term Goal 3 (Week 1): Pt will transfer to [facility] A/P with max A with 1 person OT Short Term Goal 3 - Progress (Week 1): Progressing toward goal OT Short Term Goal 4 (Week 1): Pt will perform 3/3 grooming at sink with setup OT Short Term Goal 4 - Progress (Week 1): Met Week 2:  OT Short Term Goal 1 (Week 2): Pt will don LB clothing with set up from bed level. OT Short Term Goal 2 (Week 2): Pt will bathe self with set up from bed level. OT Short Term Goal 3 (Week 2): Pt will transfer on/off BSC with mod A x1. OT Short Term Goal 4 (Week 2): Pt will be able to toilet with mod A for clothing management and set up for hygiene.  Skilled Therapeutic Interventions/Progress Updates:    Pt received in bed stating  she had used bed pan earlier with nursing and bathed and dressed from bed level without assistance. Pt alert and energetic and eager to participate stating she felt very good today. Pt's w/c positioned at side of bed to allow her to complete posterior transfer into chair with close S. Pt completed grooming at sink. She was taken to gym to work on L shoulder AROM and strength with UE ranger and 2# dowel bar. Pt did well with all exercises and was able to achieve full L shoulder flexion with dowel bar assisting. Discussed how she has difficulty tolerating her legs being in a dependent position which limits transfer or tub bench options.  Pt willing to allow her L leg lowered into dependent position and she tolerated it for 15 min. She worked on LLE AROM of ankle, toes, knee ext.  Very fearful of RLE lowering, she agreed to try for 1 minute and did tolerate. Encouraged pt to add on 1 minute each session.  Pt taken back to room with quick release belt on and call light in reach.  Therapy Documentation Precautions:  Precautions Precautions: Fall Precaution Comments: combative at times Required Braces or Orthoses: Other Brace/Splint Cervical Brace: At all times Other Brace/Splint: Right PRAFO Restrictions Weight Bearing Restrictions: Yes RLE Weight Bearing: Non weight bearing LLE Weight Bearing: Non weight bearing Other Position/Activity Restrictions: ROM BLE ok EXCEPT R ankle   Pain: Pain Assessment Pain  Assessment: No/denies pain  Function:    Grooming Oral Care,Brush Teeth, Clean Dentures Activity:      Assist Level: Supervision or verbal cues;Set up      Wash, Rinse, Dry Face Activity   Assist Level: Supervision or verbal cues;Set up      Wash, Rinse, Dry Hands Activity   Assist Level: Set up;Supervision or verbal cues      Brush, Comb Hair Activity Brush,comb hair activity did not occur: N/A      Shave Activity Shave activity did not occur: N/A        Apply Makeup Activity  Apply makeup activity did not occur: N/A                                                                Bed Mobility Roll left and right activity   Assist level: No help, No cues, assistive device, takes more than a reasonable amount of time  Sit to lying activity      Lying to sitting activity   Assist level: No help, No cues, assistive device, takes more than a reasonable amount of time  Mobility details       Cognition Comprehension Comprehension assist level: Understands basic 75 - 89% of the time/ requires cueing 10 - 24% of the time  Expression Expression assist level: Expresses basic 75 - 89% of the time/requires cueing 10 - 24% of the time. Needs helper to occlude trach/needs to repeat words.  Social Interaction Social Interaction assist level: Interacts appropriately 75 - 89% of the time - Needs redirection for appropriate language or to initiate interaction.  Problem Solving Problem solving assist level: Solves basic 50 - 74% of the time/requires cueing 25 - 49% of the time  Memory Memory assist level: Recognizes or recalls 25 - 49% of the time/requires cueing 50 - 75% of the time    Therapy/Group: Individual Therapy  [place] 09/26/2014, 10:59 AM

## 2014-09-26 NOTE — Progress Notes (Signed)
Occupational Therapy Note  Patient Details  Name: [name] MRN: [id number] Date of Birth: 1965-08-23  Today's Date: 09/26/2014 OT Individual Time: 1300-1415 OT Individual Time Calculation (min): 75 min   Pt c/o discomfort (unrated) in positioning of LLE; repositioned Individual Therapy  Pt asleep in w/c upon arrival but easily aroused.  Pt propelled to therapy gym with focus on increased efficiency and propelling around obstacles.  Pt requires increased time and is easily distracted by environmental noises and other people in hallway.  Continued education on importance of transitioning BLE into less dependent positions.  Pt verbalizes understanding.  Pt preoccupied with scabs on LLE where sutures were removed.  Pt also engaged in LUE AROM/AAROM/PROM to increase functional use in BADLs.  Pt issued blue foam block for use with left hand to increased grasp strength and increase independence with BADLs.   [name] [facility] 09/26/2014, 2:33 PM

## 2014-09-26 NOTE — Progress Notes (Signed)
Physical Therapy Session Note  Patient Details  Name: [name] MRN: [id number] Date of Birth: 1966-01-11  Today's Date: 09/26/2014 PT Individual Time: 1005-1105 PT Individual Time Calculation (min): 60 min   Short Term Goals: Week 2:  PT Short Term Goal 1 (Week 2): pt will sustain functional task for 20 min with min cues PT Short Term Goal 2 (Week 2): pt will propel wheelchair in home enviornment x 50 feet PT Short Term Goal 3 (Week 2): pt will perform car transfer with mod A PT Short Term Goal 4 (Week 2): pt will perform A/P tansfer bed<>chair with min A  Skilled Therapeutic Interventions/Progress Updates:  Session focused on functional mobility and LE strengthening. Pt propelled wheelchair 250 ft X 2 demonstrating increased activity tolerance and required less cuing for steering and technique, pt still needs verbal cues for navigating 90 degree turns,and navigating in tight spaces. Pt demonstrated improvement in managing wheelchair leg rest, still needs verbal and manual facilitation to place leg rests on/off and to swing in/out, and min A with R LE. Pt performed simulated car transfer sedan ht wheelchair<>car using arm rests and requiring mod A. Pt performed seated therex quad sets, heel slides, and hip abd/add X 10 and wheelchair arm rest push ups 3 X 10. Pt returned to room and left with OT practicing bedside commode transfers. Pt benefited from use of leg reacher to assist with R LE scooting on mat. Plan to provide pt with leg reacher.  Therapy Documentation Precautions:  Precautions Precautions: Fall Precaution Comments: combative at times Required Braces or Orthoses: Other Brace/Splint Cervical Brace: At all times Other Brace/Splint: Right PRAFO Restrictions Weight Bearing Restrictions: Yes RLE Weight Bearing: Non weight bearing LLE Weight Bearing: Non weight bearing Other Position/Activity Restrictions: ROM BLE ok EXCEPT R ankle Pain: Pain Assessment Pain Assessment:  No/denies pain Faces Pain Scale: Hurts even more Pain Type: Acute pain Pain Location: Leg Pain Orientation: Right;Left Pain Descriptors / Indicators: Aching Pain Onset: On-going Pain Intervention(s): Repositioned;Emotional support   Function:  Toileting Toileting       Bed Mobility Roll left and right activity     Sit to lying activity      Lying to sitting activity     Mobility details     Transfers Sit to stand transfer       Chair/bed transfer   Chair/bed transfer method: Anterior/posterior Chair/bed transfer assist level: Touching or steadying assistance (Pt > 75%) Chair/bed transfer assistive device: Armrests   Chair/bed transfer details: Verbal cues for sequencing   Engineer, maintenance (IT) transfer assist level: Moderate assist (Pt 50 - 74%/lift or lower, lift 2 legs)    Locomotion Ambulation          Walk 10 feet activity      Walk 50 feet with 2 turns activity      Walk 150 feet activity      Walk 10 feet on uneven surfaces activity      Stairs          Walk up/down 1 step activity        Walk up/down 4 steps activity      Walk up/down 12 steps activity      Pick up small objects from floor      Wheelchair   Type: Manual Max wheelchair distance: 250 ft Assist Level: Supervision or verbal cues  Wheel 50 feet with 2 turns activity  Assist Level: Supervision or verbal cues  Wheel 150 feet activity   Assist Level: Supervision or verbal cues   Cognition Comprehension Comprehension assist level: Understands basic 75 - 89% of the time/ requires cueing 10 - 24% of the time  Expression Expression assist level: Expresses basic 75 - 89% of the time/requires cueing 10 - 24% of the time. Needs helper to occlude trach/needs to repeat words.  Social Interaction Social Interaction assist level: Interacts appropriately 75 - 89% of the time - Needs redirection for appropriate language or to initiate interaction.  Problem  Solving Problem solving assist level: Solves basic 50 - 74% of the time/requires cueing 25 - 49% of the time  Memory Memory assist level: Recognizes or recalls 25 - 49% of the time/requires cueing 50 - 75% of the time    Therapy/Group: Individual Therapy  [name] 09/26/2014, 12:01 PM
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
## 2014-09-26 NOTE — Progress Notes (Signed)
Speech Language Pathology Daily Session Note  Patient Details  Name: [name] MRN: [id number] Date of Birth: [birth year]  Today's Date: 09/26/2014 SLP Individual Time: 1430-1530 SLP Individual Time Calculation (min): 60 min  Short Term Goals: Week 2: SLP Short Term Goal 1 (Week 2): Patient will consume current diet with minimal overt s/s of aspiration and Min A verbal cues for use of swallowing compensatory strategies.  SLP Short Term Goal 2 (Week 2): Patient will sustain attention to a functional task for 2 minutes with Mod A verbal cues for redirection.  SLP Short Term Goal 3 (Week 2): Patient will utilize external memory aids for recall of functional information with Mod A multimodal cues.  SLP Short Term Goal 4 (Week 2): Patient will demonstrate functional problem solving for basic and familiar tasks with Mod A multimodal cues.  SLP Short Term Goal 5 (Week 2): Patient will self-monitor and correct errors with functional tasks with Max A multimodal cues.  SLP Short Term Goal 6 (Week 2): Patient will utilize an increased vocal intensity to maximize speech intelligibility at the sentence level to 90% with Mod I.   Skilled Therapeutic Interventions: Skilled treatment session focused on cognitive goals. SLP facilitated session by initially providing total A for problem solving and organization during a mildly complex medication management task with organizing a 4 time per day pill box. However, patient required Mod A verbal and question cues by end of session.  Patient demonstrated selective attention to task for 45 minutes with supervision verbal cues for redirection. Patient also required Mod A question cues to recall conversation with CSW in regards to d/c planning and possible SNF placement.  Patient propelled her wheelchair to and from the dayroom with supervision verbal cues for navigation and attention to left field of environment.  Patient transferred back to bed with Min A.  Patient left  in bed with alarm on and all needs within reach. Continue with current plan of care.    Function:   Cognition Comprehension Comprehension assist level: Understands basic 75 - 89% of the time/ requires cueing 10 - 24% of the time  Expression   Expression assist level: Expresses basic 75 - 89% of the time/requires cueing 10 - 24% of the time. Needs helper to occlude trach/needs to repeat words.  Social Interaction Social Interaction assist level: Interacts appropriately 75 - 89% of the time - Needs redirection for appropriate language or to initiate interaction.  Problem Solving Problem solving assist level: Solves basic 50 - 74% of the time/requires cueing 25 - 49% of the time  Memory Memory assist level: Recognizes or recalls 25 - 49% of the time/requires cueing 50 - 75% of the time    Pain Pain Assessment Pain Assessment: No/denies pain Pain Score: Asleep  Therapy/Group: Individual Therapy  Remiel Corti 09/26/2014, 4:06 PM

## 2014-09-27 ENCOUNTER — Inpatient Hospital Stay (HOSPITAL_COMMUNITY): Payer: Self-pay | Admitting: Physical Therapy

## 2014-09-27 ENCOUNTER — Inpatient Hospital Stay (HOSPITAL_COMMUNITY): Payer: Medicaid Other | Admitting: Speech Pathology

## 2014-09-27 ENCOUNTER — Inpatient Hospital Stay (HOSPITAL_COMMUNITY): Payer: Medicaid Other

## 2014-09-27 ENCOUNTER — Inpatient Hospital Stay (HOSPITAL_COMMUNITY): Payer: Medicaid Other | Admitting: Occupational Therapy

## 2014-09-27 LAB — PROTIME-INR
INR: 2.44 — ABNORMAL HIGH (ref 0.00–1.49)
Prothrombin Time: 26.2 seconds — ABNORMAL HIGH (ref 11.6–15.2)

## 2014-09-27 MED ORDER — WARFARIN SODIUM 7.5 MG PO TABS
17.5000 mg | ORAL_TABLET | Freq: Once | ORAL | Status: AC
  Administered 2014-09-27: 17.5 mg via ORAL
  Filled 2014-09-27: qty 2
  Filled 2014-09-27: qty 1

## 2014-09-27 MED ORDER — WARFARIN SODIUM 7.5 MG PO TABS: 15.0000 mg | ORAL_TABLET | Freq: Once | ORAL | Status: DC

## 2014-09-27 MED ORDER — GABAPENTIN 100 MG PO CAPS
200.0000 mg | ORAL_CAPSULE | Freq: Three times a day (TID) | ORAL | Status: DC
  Administered 2014-09-27 – 2014-10-01 (×13): 200 mg via ORAL
  Filled 2014-09-27 (×13): qty 2

## 2014-09-27 NOTE — Progress Notes (Signed)
Occupational Therapy Session Note  Patient Details  Name: [name] MRN: [id number] Date of Birth: Mar 26, 1965  Today's Date: 09/27/2014 OT Individual Time: 1400-1500 OT Individual Time Calculation (min): 60 min    Short Term Goals: Week 2:  OT Short Term Goal 1 (Week 2): Pt will don LB clothing with set up from bed level. OT Short Term Goal 2 (Week 2): Pt will bathe self with set up from bed level. OT Short Term Goal 3 (Week 2): Pt will transfer on/off BSC with mod A x1. OT Short Term Goal 4 (Week 2): Pt will be able to toilet with mod A for clothing management and set up for hygiene.  Skilled Therapeutic Interventions/Progress Updates:    Treatment session with focus on BUE strengthening, endurance, and lateral scoots in preparation for possible slide board transfers.  Pt reports desire to focus on BUE strengthening.  Pt propelled w/c approx 75 feet with supervision.  Utilized BUE arm bike (SciFit) on random workload to challenge BUE strengthening and endurance.  Pt completed 12 mins with 1 rest break.  Completed anterior/posterior transfers on/off therapy mat with supervision and increased time.  On therapy mat engaged in lateral scoots with BLE supported on mat with pt only able to complete 2-3 scoots to Lt and then Rt reporting increased difficulty clearing buttocks due to discomfort in hands and wrists with pushing through them.  Pt required increased time and cues for problem solving with mobility. Pt returned to room and left seated upright in w/c with all needs in reach.  Therapy Documentation Precautions:  Precautions Precautions: Fall Precaution Comments: combative at times Required Braces or Orthoses: Other Brace/Splint Cervical Brace: At all times Other Brace/Splint: Right PRAFO Restrictions Weight Bearing Restrictions: Yes RLE Weight Bearing: Non weight bearing LLE Weight Bearing: Non weight bearing Other Position/Activity Restrictions: ROM BLE ok EXCEPT R ankle Pain:  Pt reports pain in BLE, unrated.  Reports having just received pain meds.    Therapy/Group: Individual Therapy  Rosalio Loud 09/27/2014, 3:23 PM

## 2014-09-27 NOTE — Progress Notes (Signed)
Occupational Therapy Session Note  Patient Details  Name: [name] MRN: [id number] Date of Birth: Jul 04, 1965  Today's Date: 09/27/2014 OT Individual Time: 1002-1130 OT Individual Time Calculation (min): 88 min    Short Term Goals: Week 2:  OT Short Term Goal 1 (Week 2): Pt will don LB clothing with set up from bed level. OT Short Term Goal 2 (Week 2): Pt will bathe self with set up from bed level. OT Short Term Goal 3 (Week 2): Pt will transfer on/off BSC with mod A x1. OT Short Term Goal 4 (Week 2): Pt will be able to toilet with mod A for clothing management and set up for hygiene.  Skilled Therapeutic Interventions/Progress Updates:      Pt seen for BADL retraining of toileting, bathing, and dressing with a focus on functional mobility of A/P transfers, lateral wt shifts, activity tolerance and sequencing. Pt received in w/c. Pt stated she would prefer to sit in wc to bathe and dress today. Pt placed at sink to bathe. Prior to LB dressing, pt needed to toilet. BSC placed in front of pt with back support removed. A to place feet up onto commode. Pt completed ant transfer with S onto commode once her feet were up. Cleansed front area, needs assist with buttocks. She did lean laterally but was having difficulty leaning enough to reach. Scooted back to w/c with A to lower legs off BSC. She did tolerate having her legs in a dependent position for 10-15 min.  With LB dressing, she needed total A as she was having great difficulty donning clothing over feet and pulling over hips from w/c position. Pt did complete w/c push ups to allow therapist to fully pull pants over hips.  She can normally pull over hips from bed level. Pt would benefit from a reacher.   Worked on attention and sequencing with dialing phone numbers of various family members. Pt needed several cues to slow down and focus on one number at a time.  Pt in w/c with quick release belt. Pt's next therapist arrived for his next  session.  Therapy Documentation Precautions:  Precautions Precautions: Fall Precaution Comments: combative at times Required Braces or Orthoses: Other Brace/Splint Cervical Brace: At all times Other Brace/Splint: Right PRAFO Restrictions Weight Bearing Restrictions: Yes RLE Weight Bearing: Non weight bearing LLE Weight Bearing: Non weight bearing Other Position/Activity Restrictions: ROM BLE ok EXCEPT R ankle    Pain: Pain Assessment Pain Assessment: 0-10 Pain Score: 10-Worst pain ever Faces Pain Scale: Hurts whole lot Pain Type: Surgical pain Pain Location: Leg Pain Orientation: Right Pain Descriptors / Indicators: Aching;Crying Pain Onset: Progressive Pain Intervention(s): RN made aware ADL:   Function:   Eating Eating               Grooming Oral Care,Brush Teeth, Clean Dentures Activity:             Wash, Rinse, Dry Face Activity   Assist Level: Supervision or verbal cues;Set up      Wash, Rinse, Dry Hands Activity   Assist Level: Set up;Supervision or verbal cues      Brush, Comb Hair Activity        Shave Activity          Apply Makeup Activity  Bathing Bathing position   Position: Wheelchair/chair at sink  Bathing parts Body parts bathed by patient: Right arm;Left arm;Chest;Abdomen;Front perineal area;Right upper leg;Left upper leg Body parts bathed by helper: Back;Buttocks;Left lower leg  Bathing assist Assist Level: Set up   Set up : To obtain items   Upper Body Dressing/Undressing Upper body dressing   What is the patient wearing?: Bra;Pull over shirt/dress Bra - Perfomed by patient: Thread/unthread right bra strap;Thread/unthread left bra strap;Hook/unhook bra (pull down sports bra)   Pull over shirt/dress - Perfomed by patient: Thread/unthread right sleeve;Thread/unthread left sleeve;Put head through opening;Pull shirt over trunk          Upper body assist Assist  Level: Set up   Set up : To obtain clothing/put away   Lower Body Dressing/Undressing Lower body dressing   What is the patient wearing?: Underwear;Pants;Non-skid slipper socks   Underwear - Performed by helper: Thread/unthread right underwear leg;Thread/unthread left underwear leg;Pull underwear up/down   Pants- Performed by helper: Thread/unthread right pants leg;Thread/unthread left pants leg;Pull pants up/down   Non-skid slipper socks- Performed by helper: Don/doff right sock                  Lower body assist         Toileting Toileting   Toileting steps completed by patient: Performs perineal hygiene Toileting steps completed by helper: Adjust clothing after toileting    Toileting assist Assist level: No help/no cues    Bed Mobility Roll left and right activity      Sit to lying activity      Lying to sitting activity      Mobility details     Transfers Sit to stand transfer        Chair/bed transfer              Toilet transfer   Toilet transfer assistive device: Bedside commode Assist level to bedside commode (at bedside): Touching or steadying assistance (Pt > 75%) (A to lift legs onto commode for anterior transfer) Assist level from bedside commode (at bedside): Touching or steadying assistance (Pt > 75%) (A to lower legs off BSC with posterior transfer)        Tub/shower transfer             Cognition Comprehension Comprehension assist level: Understands basic 75 - 89% of the time/ requires cueing 10 - 24% of the time  Expression Expression assist level: Expresses basic 90% of the time/requires cueing < 10% of the time.  Social Interaction Social Interaction assist level: Interacts appropriately 90% of the time - Needs monitoring or encouragement for participation or interaction.  Problem Solving Problem solving assist level: Solves basic 25 - 49% of the time - needs direction more than half the time to initiate, plan or complete simple  activities  Memory Memory assist level: Recognizes or recalls 25 - 49% of the time/requires cueing 50 - 75% of the time    Therapy/Group: Individual Therapy  [name] 09/27/2014, 11:52 AM

## 2014-09-27 NOTE — Plan of Care (Signed)
Problem: RH PAIN MANAGEMENT Goal: RH STG PAIN MANAGED AT OR BELOW PT'S PAIN GOAL Pain level 3 or less on a scale of 0-10.  Outcome: Not Progressing Pt complaining of significant amount of pain this morning in knee without much relief from pain medications

## 2014-09-27 NOTE — Progress Notes (Signed)
Physical Therapy Session Note  Patient Details  Name: [name] MRN: [id number] Date of Birth: 10-17-1965  Today's Date: 09/27/2014 PT Individual Time: 0900-1000 PT Individual Time Calculation (min): 60 min   Short Term Goals: Week 2:  PT Short Term Goal 1 (Week 2): pt will sustain functional task for 20 min with min cues PT Short Term Goal 2 (Week 2): pt will propel wheelchair in home enviornment x 50 feet PT Short Term Goal 3 (Week 2): pt will perform car transfer with mod A PT Short Term Goal 4 (Week 2): pt will perform A/P tansfer bed<>chair with min A  Skilled Therapeutic Interventions/Progress Updates:  Session focused on activity tolerance, wheelchair propulsion, working memory, and problem solving. Pt propelled wheelchair with bilat UE's overall 1000 feet requiring supervision and cuing for hand placement on hand rims vs wheels while demonstrating improved overall activity tolerance and participation requiring only a few short rest breaks. Pt performed working memory and problem solving task while navigating wheelchair to gift shop requiring max cuing for strategy, use of signs, and use of landmarks. Pt demonstrated difficulty with short term recall regarding medication schedule, and future plan for LE surgery. Provided pt with leg reacher to improve bed mobility. Pt left in wheelchair in room with all needs in reach and quick release belt on.   Therapy Documentation Precautions:  Precautions Precautions: Fall Precaution Comments: combative at times Required Braces or Orthoses: Other Brace/Splint Cervical Brace: At all times Other Brace/Splint: Right PRAFO Restrictions Weight Bearing Restrictions: Yes RLE Weight Bearing: Non weight bearing LLE Weight Bearing: Non weight bearing Other Position/Activity Restrictions: ROM BLE ok EXCEPT R ankle Pain: Pain Assessment Pain Assessment: Faces Pain Score: 9  Faces Pain Scale: Hurts whole lot Pain Type: Acute pain Pain Location:  Leg Pain Orientation: Right Pain Descriptors / Indicators: Aching Pain Frequency: Intermittent Pain Onset: On-going Patients Stated Pain Goal: 4 Pain Intervention(s): RN made aware;Heat applied   Function:  Toileting Toileting       Bed Mobility Roll left and right activity      Sit to lying activity      Lying to sitting activity      Mobility details     Transfers Sit to stand transfer        Chair/bed transfer               Banker transfer          Locomotion Ambulation          Walk 10 feet activity      Walk 50 feet with 2 turns activity      Walk 150 feet activity      Walk 10 feet on uneven surfaces activity      Stairs          Walk up/down 1 step activity        Walk up/down 4 steps activity      Walk up/down 12 steps activity      Pick up small objects from floor      Wheelchair   Type: Manual Max wheelchair distance: 1000 ft Assist Level: Supervision or verbal cues  Wheel 50 feet with 2 turns activity   Assist Level: Supervision or verbal cues  Wheel 150 feet activity   Assist Level: Supervision or verbal cues   Cognition Comprehension Comprehension assist level: Understands basic 75 - 89% of the time/ requires cueing 10 - 24% of the  time  Expression Expression assist level: Expresses basic 75 - 89% of the time/requires cueing 10 - 24% of the time. Needs helper to occlude trach/needs to repeat words.  Social Interaction Social Interaction assist level: Interacts appropriately 90% of the time - Needs monitoring or encouragement for participation or interaction.  Problem Solving Problem solving assist level: Solves basic 25 - 49% of the time - needs direction more than half the time to initiate, plan or complete simple activities  Memory Memory assist level: Recognizes or recalls 25 - 49% of the time/requires cueing 50 - 75% of the time    Therapy/Group: Individual Therapy  [name] 09/27/2014, 10:13  AM
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
## 2014-09-27 NOTE — Progress Notes (Signed)
Occupational Therapy Session Note  Patient Details  Name: [name] MRN: [id number] Date of Birth: 06-Nov-1965  Today's Date: 09/27/2014 OT Individual Time: 1300-1330 OT Individual Time Calculation (min): 30 min   Short Term Goals: Week 2:  OT Short Term Goal 1 (Week 2): Pt will don LB clothing with set up from bed level. OT Short Term Goal 2 (Week 2): Pt will bathe self with set up from bed level. OT Short Term Goal 3 (Week 2): Pt will transfer on/off BSC with mod A x1. OT Short Term Goal 4 (Week 2): Pt will be able to toilet with mod A for clothing management and set up for hygiene.  Skilled Therapeutic Interventions/Progress Updates: Therapeutic activity with focus on w/c mobility, AE training (reacher), and introduction to BUE strengthening using T.E.S System (universal weight machine) and Sci-Fit.   Pt received resting in her w/c awaiting therapy.   Pt educated on use of reacher to retrieve and place objects and to aid with lower body dressing.   Pt performed w/c mobility in ADL apt with min vc for positioning to use reacher appropriately to access cabinets.  While in ADL apt, pt reported awareness of BUE weakness limiting w/c mobility and complicating AP transfer technique.   Pt was educated on PRE to increase BUE strength and she performed 2 sets of straight arm pull downs @ 5 lbs, 2 sets of shoulder pull downs at 15 lbs, with good effort and interest to continue.   Pt reported history of athletics (Track in HS: hurdles/relays, as well as weight-training).   Pt was returned to her room to await next session due to time limitation, session ended.     Therapy Documentation Precautions:  Precautions Precautions: Fall Precaution Comments: combative at times Required Braces or Orthoses: Other Brace/Splint Cervical Brace: At all times Other Brace/Splint: Right PRAFO Restrictions Weight Bearing Restrictions: Yes RLE Weight Bearing: Non weight bearing LLE Weight Bearing: Non weight  bearing Other Position/Activity Restrictions: ROM BLE ok EXCEPT R ankle  Pain: Pain Assessment Pain Assessment: 0-10 Pain Score: 9  Pain Type: Acute pain Pain Location: Foot Pain Orientation: Right Pain Descriptors / Indicators: Aching;Discomfort Pain Frequency: Intermittent Pain Onset: Gradual Patients Stated Pain Goal: 4 Pain Intervention(s): Medication (See eMAR);Repositioned   Therapy/Group: Individual Therapy  Mikelle Myrick 09/27/2014, 2:50 PM

## 2014-09-27 NOTE — Progress Notes (Signed)
Spoke to Walker PA regarding timing of ankle fusion as patient set for d/c to SNF.  Patient still has a wound on ankle with min edema and they would like for this to be completely healed prior to any surgical intervention to prevent seeding of hardware. They will follow up next week for evaluation of wound and recommend office follow up a week after discharge for close wound monitoring.
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
## 2014-09-27 NOTE — Progress Notes (Signed)
Speech Language Pathology Daily Session Note  Patient Details  Name: [name] MRN: [id number] Date of Birth: 04-Jun-1965  Today's Date: 09/27/2014 SLP Individual Time: 1130-1200 SLP Individual Time Calculation (min): 30 min  Short Term Goals: Week 2: SLP Short Term Goal 1 (Week 2): Patient will consume current diet with minimal overt s/s of aspiration and Min A verbal cues for use of swallowing compensatory strategies.  SLP Short Term Goal 2 (Week 2): Patient will sustain attention to a functional task for 2 minutes with Mod A verbal cues for redirection.  SLP Short Term Goal 3 (Week 2): Patient will utilize external memory aids for recall of functional information with Mod A multimodal cues.  SLP Short Term Goal 4 (Week 2): Patient will demonstrate functional problem solving for basic and familiar tasks with Mod A multimodal cues.  SLP Short Term Goal 5 (Week 2): Patient will self-monitor and correct errors with functional tasks with Max A multimodal cues.  SLP Short Term Goal 6 (Week 2): Patient will utilize an increased vocal intensity to maximize speech intelligibility at the sentence level to 90% with Mod I.   Skilled Therapeutic Interventions: Skilled treatment session focused on cognitive goals. Upon arrival, patient was emotionally labile and talking to her mother on the phone.  After conversation, patient continued to be extremely emotional and reported frustration with the lack of family support she is receiving.  SLP facilitated session by taking patient outside "for a change of scenery" in attempts to calm the patient.  While outside, patient recalled events from morning therapy sessions with supervision question cues and was 100% intelligible at the conversation level. Patient also demonstrated increased anticipatory awareness and was asking clinician appropriate questions in regards to upcoming surgeries and d/c to SNF. Patient left upright in wheelchair with all needs within reach  and thanked this clinician and reported "going outside really changed a lot." Continue with current plan of care.   Function:   Cognition Comprehension Comprehension assist level: Understands basic 75 - 89% of the time/ requires cueing 10 - 24% of the time  Expression   Expression assist level: Expresses basic 90% of the time/requires cueing < 10% of the time.  Social Interaction Social Interaction assist level: Interacts appropriately 90% of the time - Needs monitoring or encouragement for participation or interaction.  Problem Solving Problem solving assist level: Solves basic 50 - 74% of the time/requires cueing 25 - 49% of the time  Memory Memory assist level: Recognizes or recalls 50 - 74% of the time/requires cueing 25 - 49% of the time    Pain No/Denies Pain   Therapy/Group: Individual Therapy  Nyiesha Beever 09/27/2014, 12:27 PM

## 2014-09-27 NOTE — Progress Notes (Signed)
Social Work Patient ID: [name], female   DOB: 21-Jul-1965, 49 y.o.   MRN: [id number]   Discussion with pt yesterday to review team conference and discuss d/c plans.  Have explained to pt that, per my meeting with her mother on Tuesday, mother does not feel that she can meet pt's care needs at home.  She also does not believe that her home is accessible to a w/c.  Because the original plan for d/c to her mother's home is no longer an option, then only other option I can offer is SNF unless other family members could provide her 24/7 care.  Pt seems to follow this information and is reluctantly agreeable to SNF plan, however, she states that she wants to have her follow - up surgery prior to transfer to SNF.  Per Delle Reining, Pa, it does not appear that ortho MD has any plans to perform surgery in the next couple of weeks.  Ortho to follow up with pt on Monday and look at wound/ ankle.  Will begin process for SNF placement and await further info from ortho.  Continue to follow.  Lakashia Collison, LCSW
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
## 2014-09-28 ENCOUNTER — Inpatient Hospital Stay (HOSPITAL_COMMUNITY): Payer: Medicaid Other | Admitting: Occupational Therapy

## 2014-09-28 LAB — PROTIME-INR
INR: 2.74 — ABNORMAL HIGH (ref 0.00–1.49)
Prothrombin Time: 28.6 s — ABNORMAL HIGH (ref 11.6–15.2)

## 2014-09-28 MED ORDER — WARFARIN SODIUM 7.5 MG PO TABS
15.0000 mg | ORAL_TABLET | Freq: Once | ORAL | Status: AC
  Administered 2014-09-28: 15 mg via ORAL
  Filled 2014-09-28: qty 2

## 2014-09-28 MED ORDER — CEPHALEXIN 250 MG PO CAPS
250.0000 mg | ORAL_CAPSULE | Freq: Three times a day (TID) | ORAL | Status: DC
  Administered 2014-09-28 – 2014-10-05 (×22): 250 mg via ORAL
  Filled 2014-09-28 (×22): qty 1
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
## 2014-09-28 NOTE — Progress Notes (Addendum)
Occupational Therapy Session Note  Patient Details  Name: [name] MRN: [id number] Date of Birth: May 07, 1965  Today's Date: 09/28/2014 OT Individual Time: 1300-1400 OT Individual Time Calculation (min): 60 min    Short Term Goals: Week 2:  OT Short Term Goal 1 (Week 2): Pt will don LB clothing with set up from bed level. OT Short Term Goal 2 (Week 2): Pt will bathe self with set up from bed level. OT Short Term Goal 3 (Week 2): Pt will transfer on/off BSC with mod A x1. OT Short Term Goal 4 (Week 2): Pt will be able to toilet with mod A for clothing management and set up for hygiene.  Skilled Therapeutic Interventions/Progress Updates:    Pt initially scheduled at 1030, pt very sleepy and difficult to arouse. Pt stated that she is too tired to open her eyes and do any therapy at this time. Pt has been rescheduled for 1300-1400, but will miss 30 min of her 90 min scheduled session. Pt in room in bed at 1400 and stated her aunt was about to arrive to do her hair. Pt concerned about starting therapy, pt finally agreed to begin. Aunt arrived and agreeable to returning at 1400 after therapy. Pt needed to toilet but was quite insistent about using the bedpan, explained to pt that she need to practice [facility] transfers. Pt agreed and transferred bed to [facility] posterior. Pt toilet and completed dressing from [facility]. Her pants are too tight, so pt moved back to bed into supine so she could roll to pull over hips. Moved posterior back to w/c.  She continues to have difficulty with fully clearing her bottom due to weak UEs.   After grooming, pt stated L shoulder was bothering her from pulling with it on bed rails.  Pt worked on L shoulder PROM, AROM, and self massage. During session today, pt continued to be lethargic and seemed to be processing at a slower rate. She did follow basic instructions well.  Pt in chair at end of session with ice pack on shoulder for relief, call light in reach. Quick release belt not  on as family was arriving back in room.  Pt aware to not get up without help. Family asked to ask nursing to apply quick release belt when they leave the room.  Therapy Documentation Precautions:  Precautions Precautions: Fall Precaution Comments: combative at times Required Braces or Orthoses: Other Brace/Splint Cervical Brace: At all times Other Brace/Splint: Right PRAFO Restrictions Weight Bearing Restrictions: Yes RLE Weight Bearing: Non weight bearing LLE Weight Bearing: Non weight bearing Other Position/Activity Restrictions: ROM BLE ok EXCEPT R ankle General: General OT Amount of Missed Time: 30 Minutes Pain:  no c/o pain ADL:     Function:   Eating Eating               Grooming Oral Care,Brush Teeth, Clean Dentures Activity:      Assist Level: No help, No cues      Wash, Rinse, Dry Face Activity   Assist Level: No help, No cues      Wash, Rinse, Dry Hands Activity   Assist Level: No help, No cues      Brush, Comb Hair Activity        Shave Activity          Apply Makeup Activity  Bathing Bathing position      Bathing parts      Bathing assist         Upper Body Dressing/Undressing Upper body dressing   What is the patient wearing?: Bra;Pull over shirt/dress Bra - Perfomed by patient: Thread/unthread right bra strap;Thread/unthread left bra strap;Hook/unhook bra (pull down sports bra)   Pull over shirt/dress - Perfomed by patient: Thread/unthread right sleeve;Thread/unthread left sleeve;Put head through opening;Pull shirt over trunk          Upper body assist Assist Level: Set up   Set up : To obtain clothing/put away   Lower Body Dressing/Undressing Lower body dressing   What is the patient wearing?: Underwear;Pants;Non-skid slipper socks Underwear - Performed by patient: Thread/unthread left underwear leg;Pull underwear up/down Underwear - Performed by helper:  Thread/unthread right underwear leg Pants- Performed by patient: Thread/unthread left pants leg;Pull pants up/down Pants- Performed by helper: Thread/unthread right pants leg                      Lower body assist Assist Level: More than reasonable time       Toileting Toileting   Toileting steps completed by patient: Performs perineal hygiene;Adjust clothing prior to toileting;Adjust clothing after toileting (adjusted clothing after in bed)      Toileting assist Assist level: No help/no cues    Bed Mobility Roll left and right activity   Assist level: No help, No cues, assistive device, takes more than a reasonable amount of time  Sit to lying activity   Assist level: No help, No cues, assistive device, takes more than a reasonable amount of time  Lying to sitting activity   Assist level: No help, No cues, assistive device, takes more than a reasonable amount of time  Mobility details     Transfers Sit to stand transfer        Chair/bed transfer   Chair/bed transfer method: Anterior/posterior Chair/bed transfer assist level: Set up only Chair/bed transfer assistive device: Armrests      Toilet transfer   Toilet transfer assistive device: Bedside commode Assist level to bedside commode (at bedside): Set up only Assist level from bedside commode (at bedside): Set up only        Tub/shower transfer             Cognition Comprehension Comprehension assist level: Understands basic 75 - 89% of the time/ requires cueing 10 - 24% of the time  Expression Expression assist level: Expresses basic 90% of the time/requires cueing < 10% of the time.  Social Interaction Social Interaction assist level: Interacts appropriately 90% of the time - Needs monitoring or encouragement for participation or interaction.  Problem Solving Problem solving assist level: Solves basic 50 - 74% of the time/requires cueing 25 - 49% of the time  Memory Memory assist level: Recognizes or  recalls 50 - 74% of the time/requires cueing 25 - 49% of the time    Therapy/Group: Individual Therapy  SAGUIER,JULIA 09/28/2014, 3:08 PM
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
## 2014-09-29 ENCOUNTER — Inpatient Hospital Stay (HOSPITAL_COMMUNITY): Payer: Medicaid Other | Admitting: Occupational Therapy

## 2014-09-29 LAB — PROTIME-INR
INR: 2.65 — ABNORMAL HIGH (ref 0.00–1.49)
Prothrombin Time: 27.9 seconds — ABNORMAL HIGH (ref 11.6–15.2)

## 2014-09-29 LAB — URINE CULTURE: Culture: >=100000

## 2014-09-29 MED ORDER — WARFARIN SODIUM 7.5 MG PO TABS
15.0000 mg | ORAL_TABLET | Freq: Once | ORAL | Status: AC
  Administered 2014-09-29: 15 mg via ORAL
  Filled 2014-09-29: qty 2

## 2014-09-29 NOTE — Progress Notes (Signed)
Occupational Therapy Session Note  Patient Details  Name: [name] MRN: [id number] Date of Birth: 07-May-1965  Today's Date: 09/29/2014 OT Individual Time: 4098-1191 OT Individual Time Calculation (min): 40 min    Short Term Goals: Week 2:  OT Short Term Goal 1 (Week 2): Pt will don LB clothing with set up from bed level. OT Short Term Goal 2 (Week 2): Pt will bathe self with set up from bed level. OT Short Term Goal 3 (Week 2): Pt will transfer on/off BSC with mod A x1. OT Short Term Goal 4 (Week 2): Pt will be able to toilet with mod A for clothing management and set up for hygiene.  Skilled Therapeutic Interventions/Progress Updates:  Upon entering the room, pt supine in bed with RN present in room and finishing breakfast. Her son was also present but did not stay to observe therapy session. OT session with focus on functional transfers, self care, lateral leans, and pt education. Pt anterior/posterior transfer preformed with +2 for safety this session secondary to pt with c/o B arm pain and weakness this session. Pt transferring onto drop arm commode chair for toileting and then dressing and bathing as well. Pt requiring mod verbal cues for safety, sequencing, and attending to task this session. Pt declined sitting in wheelchair and returned to bed at end of session. See below for further details. Bed alarm activated. Call bell and all needed items within reach upon exiting. Pt oriented to self, time, and situation with min verbal cues this session.   Therapy Documentation Precautions:  Precautions Precautions: Fall Precaution Comments: combative at times Required Braces or Orthoses: Other Brace/Splint Cervical Brace: At all times Other Brace/Splint: Right PRAFO Restrictions Weight Bearing Restrictions: Yes RLE Weight Bearing: Non weight bearing LLE Weight Bearing: Non weight bearing Other Position/Activity Restrictions: ROM BLE ok EXCEPT R ankle Vital Signs: Therapy  Vitals Temp: 97.9 F (36.6 C) Temp Source: Oral Pulse Rate: 83 Resp: 16 BP: 128/71 mmHg Patient Position (if appropriate): Lying Oxygen Therapy SpO2: 99 % O2 Device: Not Delivered  Function:    Grooming Oral Care,Brush Teeth, Clean Dentures Activity:      Assist Level: Set up   Set up : To obtain items  Wash, Rinse, Dry Face Activity          Wash, Rinse, Dry Hands Activity   Assist Level: Set up   Set up : To obtain items  Brush, Comb Hair Activity        Shave Activity          Apply Makeup Activity                                                               Upper Body Dressing/Undressing Upper body dressing   What is the patient wearing?: Pull over shirt/dress Bra - Perfomed by patient: Thread/unthread right bra strap;Thread/unthread left bra strap;Hook/unhook bra (pull down sports bra)              Upper body assist Assist Level: Set up   Set up : To obtain clothing/put away   Lower Body Dressing/Undressing Lower body dressing   What is the patient wearing?: Underwear;Pants Underwear - Performed by patient: Thread/unthread left underwear leg Underwear - Performed by helper: Thread/unthread right underwear leg;Pull underwear up/down  Pants- Performed by patient: Thread/unthread left pants leg Pants- Performed by helper: Thread/unthread right pants leg;Pull pants up/down                      Lower body assist         Toileting Toileting   Toileting steps completed by patient: Performs perineal hygiene;Adjust clothing prior to toileting Toileting steps completed by helper: Adjust clothing after toileting    Toileting assist        Transfers Sit to stand transfer        Chair/bed transfer   Chair/bed transfer method: Anterior/posterior Chair/bed transfer assist level: 2 helpers Chair/bed transfer assistive device: Armrests   Chair/bed transfer details: Verbal cues for sequencing  Toilet transfer   Toilet transfer  assistive device: Bedside commode (drop arm anterior/posterior) Assist level to bedside commode (at bedside): 2 helpers Assist level from bedside commode (at bedside): 2 helpers        Tub/shower transfer             Cognition Comprehension Comprehension assist level: Understands basic 75 - 89% of the time/ requires cueing 10 - 24% of the time  Expression Expression assist level: Expresses basic 75 - 89% of the time/requires cueing 10 - 24% of the time. Needs helper to occlude trach/needs to repeat words.  Social Interaction Social Interaction assist level: Interacts appropriately 90% of the time - Needs monitoring or encouragement for participation or interaction.  Problem Solving Problem solving assist level: Solves basic 50 - 74% of the time/requires cueing 25 - 49% of the time  Memory Memory assist level: Recognizes or recalls 50 - 74% of the time/requires cueing 25 - 49% of the time    Therapy/Group: Individual Therapy  Lowella Grip 09/29/2014, 8:45 AM
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
## 2014-09-30 ENCOUNTER — Inpatient Hospital Stay (HOSPITAL_COMMUNITY): Payer: Medicaid Other

## 2014-09-30 ENCOUNTER — Inpatient Hospital Stay (HOSPITAL_COMMUNITY): Payer: Medicaid Other | Admitting: Speech Pathology

## 2014-09-30 ENCOUNTER — Inpatient Hospital Stay (HOSPITAL_COMMUNITY): Payer: Medicaid Other | Admitting: Occupational Therapy

## 2014-09-30 LAB — PROTIME-INR
INR: 2.24 — ABNORMAL HIGH (ref 0.00–1.49)
Prothrombin Time: 24.6 seconds — ABNORMAL HIGH (ref 11.6–15.2)

## 2014-09-30 MED ORDER — WARFARIN SODIUM 7.5 MG PO TABS
17.5000 mg | ORAL_TABLET | Freq: Once | ORAL | Status: AC
  Administered 2014-09-30: 17.5 mg via ORAL
  Filled 2014-09-30: qty 1
  Filled 2014-09-30: qty 2

## 2014-09-30 NOTE — Progress Notes (Signed)
Physical Therapy Session Note  Patient Details  Name: [name] MRN: [id number] Date of Birth: [birth year]  Today's Date: 09/30/2014 PT Individual Time: 1106-1208 and 1510-1610 PT Individual Time Calculation (min): 62 min   Short Term Goals: Week 2:  PT Short Term Goal 1 (Week 2): pt will sustain functional task for 20 min with min cues PT Short Term Goal 2 (Week 2): pt will propel wheelchair in home enviornment x 50 feet PT Short Term Goal 3 (Week 2): pt will perform car transfer with mod A PT Short Term Goal 4 (Week 2): pt will perform A/P tansfer bed<>chair with min A  Skilled Therapeutic Interventions/Progress Updates:   Session 1: Patient up in wheelchair, propelled to gym as noted below, transferred to mat via P>A transfer with assist for LE's/scooting and return to supine.  Then performed therex to include hip flexion AAROM, abduction AAROM, left ankle DF/PF AROM, passive hamstring stretch and heel cord stretch to left LE, and isometric hip internal rotation all x 10-20 reps.  Patient rolled on mat assist for hips to roll R/L with cues without bedrails.  Patient able to cue this therapist appropriately with increased time on how to assist moving left LE.  Patient back into chair min/mod assist for scooting and supporting legs. Left up in chair with nurse tech in room.  Session 2: Patient propelled over level tile, outdoor brick and concrete with inclines with cues and S.  Cues for maneuvering over high thresholds and due to getting off concrete onto grass.  Patient needed increased time and min to mod questioning cues for wayfinding.  Patient also needing cues for memory in regards to conversation with nurse tech prior to leaving unit and upon returning.  Patient transferred to [facility], then to bed with A-P technique min assist and cues with increased time; assist for doffing/donning pants.  Patient left in bed with nurse tech and nurse in the room.  Therapy Documentation Precautions:   Precautions Precautions: Fall Precaution Comments: combative at times Required Braces or Orthoses: Other Brace/Splint Cervical Brace: At all times Other Brace/Splint: Right PRAFO Restrictions Weight Bearing Restrictions: Yes RLE Weight Bearing: Non weight bearing LLE Weight Bearing: Non weight bearing Other Position/Activity Restrictions: ROM BLE ok EXCEPT R ankle  Pain: Pain Assessment Pain Score: 5  Pain Type: Surgical pain Pain Location: Leg Pain Orientation: Right Pain Descriptors / Indicators: Aching Pain Onset: On-going Pain Intervention(s): Rest;Elevated extremity   Function:  Toileting Toileting   Toileting steps completed by patient: Performs perineal hygiene Toileting steps completed by helper: Adjust clothing after toileting;Adjust clothing prior to toileting Toileting Assistive Devices: Grab bar or rail Assist level: Touching or steadying assistance (Pt.75%)   Bed Mobility Roll left and right activity   Assist level: Touching or steadying assistance (Pt > 75%, lift 1 leg)    Sit to lying activity   Assist level: Touching or steadying assistance (Pt > 75%, lift 1 leg)    Lying to sitting activity   Assist level: Touching or steadying assistance (Pt > 75%, lift 1 leg)    Mobility details Bed mobility details: Tactile cues for sequencing   Transfers Sit to stand transfer        Chair/bed transfer   Chair/bed transfer method: Anterior/posterior Chair/bed transfer assist level: Moderate assist (Pt 50 - 74%/lift or lower) Chair/bed transfer assistive device: Armrests   Chair/bed transfer details: Manual facilitation for weight shifting   Toilet transfer   Toilet transfer assistive device: Bedside commode   Assist  level to toilet: Touching or steadying assistance (Pt > 75%) Assist level from toilet: Touching or steadying assistance (Pt > 75%) Assist level to bedside commode (at bedside): Touching or steadying assistance (Pt > 75%) Assist level from  bedside commode (at bedside): Touching or steadying assistance (Pt > 75%)  Car transfer          Locomotion Ambulation          Walk 10 feet activity      Walk 50 feet with 2 turns activity      Walk 150 feet activity      Walk 10 feet on uneven surfaces activity      Stairs          Walk up/down 1 step activity        Walk up/down 4 steps activity      Walk up/down 12 steps activity      Pick up small objects from floor      Wheelchair   Type: Manual Max wheelchair distance: 500 Assist Level: Supervision or verbal cues  Wheel 50 feet with 2 turns activity   Assist Level: Supervision or verbal cues  Wheel 150 feet activity   Assist Level: Supervision or verbal cues   Cognition Comprehension Comprehension assist level: Understands basic 50 - 74% of the time/ requires cueing 25 - 49% of the time  Expression Expression assist level: Expresses basic 50 - 74% of the time/requires cueing 25 - 49% of the time. Needs to repeat parts of sentences.  Social Interaction Social Interaction assist level: Interacts appropriately 90% of the time - Needs monitoring or encouragement for participation or interaction.  Problem Solving Problem solving assist level: Solves basic 50 - 74% of the time/requires cueing 25 - 49% of the time  Memory Memory assist level: Recognizes or recalls 25 - 49% of the time/requires cueing 50 - 75% of the time    Therapy/Group: Individual Therapy  Rudi Coco [place], [place] [phone] 09/30/2014  09/30/2014, 5:09 PM

## 2014-09-30 NOTE — Progress Notes (Signed)
Speech Language Pathology Daily Session Note  Patient Details  Name: [name] MRN: [id number] Date of Birth: Mar 08, 1965  Today's Date: 09/30/2014 SLP Individual Time: 1300-1400 SLP Individual Time Calculation (min): 60 min  Short Term Goals: Week 2: SLP Short Term Goal 1 (Week 2): Patient will consume current diet with minimal overt s/s of aspiration and Min A verbal cues for use of swallowing compensatory strategies.  SLP Short Term Goal 2 (Week 2): Patient will sustain attention to a functional task for 2 minutes with Mod A verbal cues for redirection.  SLP Short Term Goal 3 (Week 2): Patient will utilize external memory aids for recall of functional information with Mod A multimodal cues.  SLP Short Term Goal 4 (Week 2): Patient will demonstrate functional problem solving for basic and familiar tasks with Mod A multimodal cues.  SLP Short Term Goal 5 (Week 2): Patient will self-monitor and correct errors with functional tasks with Max A multimodal cues.  SLP Short Term Goal 6 (Week 2): Patient will utilize an increased vocal intensity to maximize speech intelligibility at the sentence level to 90% with Mod I.   Skilled Therapeutic Interventions: Skilled treatment session focused on cognitive goals. Upon arrival, patient was asleep while sitting upright in the wheelchair but was easily awakened. SLP facilitated session by providing Max A multimodal cues for focused attention to a task for 30-60 seconds. Patient demonstrated emergent awareness of fatigue and reported it was due to medications, RN made aware.  Patient participated in a mildly complex problem solving and organization task and required Max A multimodal cues to self-monitor and correct errors throughout the task, especially in regards to written expression for recall of rules to task.  Patient left upright in wheelchair with all needs within reach, quick release belt in place and visitor present. Continue with current plan of care.      Function:   Cognition Comprehension Comprehension assist level: Understands basic 50 - 74% of the time/ requires cueing 25 - 49% of the time  Expression   Expression assist level: Expresses basic 50 - 74% of the time/requires cueing 25 - 49% of the time. Needs to repeat parts of sentences.  Social Interaction Social Interaction assist level: Interacts appropriately 90% of the time - Needs monitoring or encouragement for participation or interaction.  Problem Solving Problem solving assist level: Solves basic 50 - 74% of the time/requires cueing 25 - 49% of the time  Memory Memory assist level: Recognizes or recalls 25 - 49% of the time/requires cueing 50 - 75% of the time    Pain No reports of pain throughout the session   Therapy/Group: Individual Therapy  Renly Roots 09/30/2014, 4:23 PM

## 2014-09-30 NOTE — Plan of Care (Signed)
Problem: RH Dressing Goal: LTG Patient will perform upper body dressing (OT) LTG Patient will perform upper body dressing with assist, with/without cues (OT).  LTG downgraded as pt is not able to access cabinets/ closet independently. Goal: LTG Patient will perform lower body dressing w/assist (OT) LTG: Patient will perform lower body dressing with assist, with/without cues in positioning using equipment (OT)  LTG downgraded to min A as pt continues to have difficulty managing pants over hips.  Problem: RH Toileting Goal: LTG Patient will perform toileting w/assist, cues/equip (OT) LTG: Patient will perform toiletiing (clothes management/hygiene) with assist, with/without cues using equipment (OT)  LTG downgraded to min A as pt continues to have difficulty managing clothing over hips.  Problem: RH Toilet Transfers Goal: LTG Patient will perform toilet transfers w/assist (OT) LTG: Patient will perform toilet transfers with assist, with/without cues using equipment (OT)  LTG upgraded from min A to set up.  Problem: RH Tub/Shower Transfers Goal: LTG Patient will perform tub/shower transfers w/assist (OT) LTG: Patient will perform tub/shower transfers with assist, with/without cues using equipment (OT)  LTG discontinued, not applicable at this time.
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
## 2014-09-30 NOTE — Progress Notes (Signed)
ANTICOAGULATION CONSULT NOTE - Follow Up Consult  Pharmacy Consult for coumadin Indication: VTE prophylaxis  Allergies  Allergen Reactions  . Codeine Nausea And Vomiting  . Codeine Nausea And Vomiting  . Vicodin [Hydrocodone-Acetaminophen] Nausea And Vomiting  . Vicodin [Hydrocodone-Acetaminophen] Nausea And Vomiting    Patient Measurements: Weight: 170 lb 12.8 oz (77.474 kg) Heparin Dosing Weight:   Vital Signs: Temp: 98.2 F (36.8 C) (08/22 0529) Temp Source: Oral (08/22 0529) BP: 130/66 mmHg (08/22 0529) Pulse Rate: 85 (08/22 0529)  Labs:  Recent Labs  09/28/14 0610 09/29/14 0559 09/30/14 0435  LABPROT 28.6* 27.9* 24.6*  INR 2.74* 2.65* 2.24*    Estimated Creatinine Clearance: 88.5 mL/min (by C-G formula based on Cr of 0.65).   Medications:  Scheduled:  . bethanechol  10 mg Oral TID  . cephALEXin  250 mg Oral 3 times per day  . chlorhexidine  15 mL Mouth Rinse BID  . clonazePAM  0.5 mg Oral BID  . escitalopram  20 mg Oral Daily  . fentaNYL  50 mcg Transdermal Q72H  . gabapentin  200 mg Oral TID  . metoprolol tartrate  50 mg Oral BID  . multivitamin with minerals  1 tablet Oral Daily  . pneumococcal 23 valent vaccine  0.5 mL Intramuscular Tomorrow-1000  . polyethylene glycol  17 g Oral BID  . QUEtiapine  200 mg Oral QHS  . QUEtiapine  50 mg Oral Daily  . senna-docusate  2 tablet Oral QHS  . valproic acid  500 mg Oral BID  . Warfarin - Pharmacist Dosing Inpatient   Does not apply q1800   Infusions:    Assessment: 49 yo female is currently on therapeutic coumadin for VTE prophylaxis.  INR is down to 2.24 from 2.65.   Goal of Therapy:  INR 2-3 Monitor platelets by anticoagulation protocol: Yes   Plan:  - coumadin 17.5 mg po x1  - INR in am  Nabria Nevin, Tsz-Yin 09/30/2014,8:23 AM
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
## 2014-10-01 ENCOUNTER — Inpatient Hospital Stay (HOSPITAL_COMMUNITY): Payer: Self-pay | Admitting: Speech Pathology

## 2014-10-01 ENCOUNTER — Inpatient Hospital Stay (HOSPITAL_COMMUNITY): Payer: Medicaid Other | Admitting: Physical Therapy

## 2014-10-01 ENCOUNTER — Inpatient Hospital Stay (HOSPITAL_COMMUNITY): Payer: Self-pay

## 2014-10-01 ENCOUNTER — Inpatient Hospital Stay (HOSPITAL_COMMUNITY): Payer: Self-pay | Admitting: Occupational Therapy

## 2014-10-01 ENCOUNTER — Inpatient Hospital Stay (HOSPITAL_COMMUNITY): Payer: Medicaid Other

## 2014-10-01 ENCOUNTER — Inpatient Hospital Stay (HOSPITAL_COMMUNITY): Payer: Self-pay | Admitting: Physical Therapy

## 2014-10-01 LAB — PROTIME-INR
INR: >10 (ref 0.00–1.49)
INR: 2.01 — ABNORMAL HIGH (ref 0.00–1.49)
Prothrombin Time: >90 seconds — ABNORMAL HIGH (ref 11.6–15.2)
Prothrombin Time: 22.6 seconds — ABNORMAL HIGH (ref 11.6–15.2)

## 2014-10-01 MED ORDER — WARFARIN SODIUM 2.5 MG PO TABS
17.5000 mg | ORAL_TABLET | Freq: Once | ORAL | Status: AC
  Administered 2014-10-01: 17.5 mg via ORAL
  Filled 2014-10-01: qty 1
  Filled 2014-10-01: qty 2

## 2014-10-01 NOTE — Progress Notes (Signed)
ANTICOAGULATION CONSULT NOTE - Follow Up Consult  Pharmacy Consult for coumadin Indication: VTE prophylaxis  Allergies  Allergen Reactions  . Codeine Nausea And Vomiting  . Codeine Nausea And Vomiting  . Vicodin [Hydrocodone-Acetaminophen] Nausea And Vomiting  . Vicodin [Hydrocodone-Acetaminophen] Nausea And Vomiting    Patient Measurements: Weight: 170 lb 12.8 oz (77.474 kg) Heparin Dosing Weight:   Vital Signs: Temp: 98 F (36.7 C) (08/23 0508) Temp Source: Oral (08/23 0508) BP: 162/88 mmHg (08/23 0508) Pulse Rate: 77 (08/23 0508)  Labs:  Recent Labs  09/29/14 0559 09/30/14 0435 10/01/14 0618  LABPROT 27.9* 24.6* >90.0*  INR 2.65* 2.24* >10.00*    Estimated Creatinine Clearance: 88.5 mL/min (by C-G formula based on Cr of 0.65).   Medications:  Scheduled:  . bethanechol  10 mg Oral TID  . cephALEXin  250 mg Oral 3 times per day  . chlorhexidine  15 mL Mouth Rinse BID  . clonazePAM  0.5 mg Oral BID  . escitalopram  20 mg Oral Daily  . fentaNYL  50 mcg Transdermal Q72H  . gabapentin  200 mg Oral TID  . metoprolol tartrate  50 mg Oral BID  . multivitamin with minerals  1 tablet Oral Daily  . pneumococcal 23 valent vaccine  0.5 mL Intramuscular Tomorrow-1000  . polyethylene glycol  17 g Oral BID  . QUEtiapine  200 mg Oral QHS  . QUEtiapine  50 mg Oral Daily  . senna-docusate  2 tablet Oral QHS  . valproic acid  500 mg Oral BID  . Warfarin - Pharmacist Dosing Inpatient   Does not apply q1800   Infusions:    Assessment: 49 yo female is currently on therapeutic coumadin for VTE prophylaxis.  INR today is 2.01.  Goal of Therapy:  INR 2-3 Monitor platelets by anticoagulation protocol: Yes   Plan:  - coumadin 17.5 mg po x1 - INR in am  Netanel Yannuzzi, Tsz-Yin 10/01/2014,8:16 AM

## 2014-10-01 NOTE — Progress Notes (Signed)
SLP Cancellation Note  Patient Details Name: Marguerette Sheller MRN: [id number] DOB: [birth year]   Cancelled treatment:       Patient missed 30 minutes of skilled SLP intervention due to patient being on bed rest and unable to maintain arousal to participate at this time.    Feliberto Gottron, MA, CCC-SLP [phone]                                                                                                Feliberto Gottron 10/01/2014, 2:33 PM

## 2014-10-01 NOTE — Progress Notes (Signed)
Occupational Therapy Note  Patient Details  Name: [name] MRN: [id number] Date of Birth: 1965/09/30  Today's Date: 10/01/2014 OT Missed Time: 90 Minutes Missed Time Reason: Patient on bedrest;MD hold (comment)     Sydelle Sherfield 10/01/2014, 8:51 AM

## 2014-10-01 NOTE — Progress Notes (Signed)
Physical Therapy Weekly Progress Note  Patient Details  Name: [name] MRN: [id number] Date of Birth: May 14, 1965  Beginning of progress report period: September 24, 2014 End of progress report period: October 01, 2014  Today's Date: 10/01/2014 PT Individual Time: 1000-1030 PT Individual Time Calculation (min): 30 min   Patient has partially met 1 of 2 short term goals.  Pt progress has been slow and inconsistent limited by decreased activity tolerance, recall and problem solving abilities, and now recommending SNF at D/C due to decreased caregiver support. Pt currently fluctuates between min-mod A for A/P transfers and bed mobility and requires mod to max verbal cues for sequence and technique. Pt is supervision for wheelchair propulsion and min to mod A for parts management. Pt appears motivated and would still benefit skilled PT.  Patient continues to demonstrate the following deficits: LE weakness, memory and attention deficits, and activity tolerance and therefore will continue to benefit from skilled PT intervention to enhance overall performance with activity tolerance, attention, awareness, coordination and knowledge of precautions.  Patient is progressing toward long term goals..  Plan of care revisions: D/C goals of car transfer and wheelchair propulsion in home environment due to SNF placement.  PT Short Term Goals Week 2:  PT Short Term Goal 1 (Week 2): pt will sustain functional task for 20 min with min cues PT Short Term Goal 1 - Progress (Week 2): Not met PT Short Term Goal 2 (Week 2): pt will propel wheelchair in home enviornment x 50 feet PT Short Term Goal 2 - Progress (Week 2): Discontinued (comment) (D/C per SNF placement) PT Short Term Goal 3 (Week 2): pt will perform car transfer with mod A PT Short Term Goal 3 - Progress (Week 2): Discontinued (comment) (D/C per SNF placement) PT Short Term Goal 4 (Week 2): pt will perform A/P tansfer bed<>chair with min A PT Short Term  Goal 4 - Progress (Week 2): Partly met Week 3:  PT Short Term Goal 1 (Week 3): pt will sustain attention during funcitonal task for 20 min with min A cues PT Short Term Goal 2 (Week 3): pt will perform A/P bed<>wheelchair transfer consistently with min A PT Short Term Goal 3 (Week 3): pt will perform all wheelchair parts management with supervision PT Short Term Goal 4 (Week 3): pt will verbalize and direct care entire sequence and technique for A/P wheelchair<>bed transfers  Skilled Therapeutic Interventions/Progress Updates:  MD placed pt on bedrest. Switched out pt's reclining back wheelchair for standard wheelchair to allow pt to independently manage wheelchair locking brakes. SPT adjusted brake on wheelchair and placed wheelchair in room, pt was left in bed with all needs in reach  Therapy Documentation Precautions:  Precautions Precautions: Fall Precaution Comments: combative at times Required Braces or Orthoses: Other Brace/Splint Cervical Brace: At all times Other Brace/Splint: Right PRAFO Restrictions Weight Bearing Restrictions: Yes RLE Weight Bearing: Non weight bearing LLE Weight Bearing: Non weight bearing Other Position/Activity Restrictions: ROM BLE ok EXCEPT R ankle General: PT Amount of Missed Time (min): 30 Minutes PT Missed Treatment Reason: MD hold (Comment) (bed rest per MD due to low INR levels)  Therapy/Group: Individual Therapy  Elsie Ra 10/01/2014, 10:35 AM

## 2014-10-01 NOTE — Progress Notes (Signed)
Occupational Therapy Note  Patient Details  Name: [name] MRN: [id number] Date of Birth: Aug 28, 1965  Today's Date: 10/01/2014 OT Missed Time: 60 Minutes Missed Time Reason: Patient on bedrest;MD hold (comment)    Rich Brave 10/01/2014, 1:33 PM

## 2014-10-01 NOTE — Progress Notes (Signed)
Occupational Therapy Session Note  Patient Details  Name: [name] MRN: [id number] Date of Birth: 1965/09/06  Today's Date: 10/01/2014 OT Individual Time: 1130-1200 OT Individual Time Calculation (min): 30 min   Short Term Goals: Week 2:  OT Short Term Goal 1 (Week 2): Pt will don LB clothing with set up from bed level. OT Short Term Goal 2 (Week 2): Pt will bathe self with set up from bed level. OT Short Term Goal 3 (Week 2): Pt will transfer on/off BSC with mod A x1. OT Short Term Goal 4 (Week 2): Pt will be able to toilet with mod A for clothing management and set up for hygiene.  Skilled Therapeutic Interventions/Progress Updates: Therapeutic activity at bed level with focus on attention, orientation, and bed mobility.   Pt with residual disorientation, on bed rest per MD recently currently off hold per RN.   Pt lethargic but agreeable to sorting her mail with HOB elevated.   Pt required max vc to attend to task but proceeded through opening and reading 4 letters (solicitations for services) to discard.   Pt then requested assist with bedpan placement d/t need to toilet (BM).   Pt required max assist and vc for sequencing.   Pt left on bedpan at end of session; RN and RN tech notified.       Therapy Documentation Precautions:  Precautions Precautions: Fall Precaution Comments: combative at times Required Braces or Orthoses: Other Brace/Splint Cervical Brace: At all times Other Brace/Splint: Right PRAFO Restrictions Weight Bearing Restrictions: Yes RLE Weight Bearing: Non weight bearing LLE Weight Bearing: Non weight bearing Other Position/Activity Restrictions: ROM BLE ok EXCEPT R ankle  Pain: No/denies pain     Therapy/Group: Individual Therapy  Hanni Milford 10/01/2014, 12:10 PM

## 2014-10-01 NOTE — Progress Notes (Signed)
Speech Language Pathology Weekly Progress Note  Patient Details  Name: Debbera Wolken MRN: [id number] Date of Birth: Feb 18, 1965  Beginning of progress report period: September 24, 2014 End of progress report period: October 01, 2014  Short Term Goals: Week 2: SLP Short Term Goal 1 (Week 2): Patient will consume current diet with minimal overt s/s of aspiration and Min A verbal cues for use of swallowing compensatory strategies.  SLP Short Term Goal 1 - Progress (Week 2): Not met SLP Short Term Goal 2 (Week 2): Patient will sustain attention to a functional task for 2 minutes with Mod A verbal cues for redirection.  SLP Short Term Goal 2 - Progress (Week 2): Not met SLP Short Term Goal 3 (Week 2): Patient will utilize external memory aids for recall of functional information with Mod A multimodal cues.  SLP Short Term Goal 3 - Progress (Week 2): Met SLP Short Term Goal 4 (Week 2): Patient will demonstrate functional problem solving for basic and familiar tasks with Mod A multimodal cues.  SLP Short Term Goal 4 - Progress (Week 2): Not met SLP Short Term Goal 5 (Week 2): Patient will self-monitor and correct errors with functional tasks with Max A multimodal cues.  SLP Short Term Goal 5 - Progress (Week 2): Not met SLP Short Term Goal 6 (Week 2): Patient will utilize an increased vocal intensity to maximize speech intelligibility at the sentence level to 90% with Mod I.  SLP Short Term Goal 6 - Progress (Week 2): Met    New Short Term Goals: Week 3: SLP Short Term Goal 1 (Week 3): Patient will consume current diet with minimal overt s/s of aspiration and Min A verbal cues for use of swallowing compensatory strategies.  SLP Short Term Goal 2 (Week 3): Patient will sustain attention to a functional task for 2 minutes with Mod A verbal cues for redirection.  SLP Short Term Goal 3 (Week 3): Patient will demonstrate functional problem solving for basic and familiar tasks with Mod A multimodal cues.   SLP Short Term Goal 4 (Week 3): Patient will utilize external memory aids for recall of functional information with Min A multimodal cues.  SLP Short Term Goal 5 (Week 3): Patient will self-monitor and correct errors with functional tasks with Max A multimodal cues.  SLP Short Term Goal 6 (Week 3): Patient will utilize an increased vocal intensity to maximize speech intelligibility at the sentence level to 100% with Mod I.   Weekly Progress Updates: Patient has made minimal gains and has met 2 of 6 STG's this reporting period due to increased recall and speech intelligibility. Currently, patient is consuming Dys. 3 textures with thin liquids with minimal overt s/s of aspiration and requires Mod-Max A multimodal cues for use of swallowing compensatory strategies in regards to small bites and maintaining arousal for PO intake.  Patient is also ~90% intelligible at the phrase and sentence level due to increased vocal intensity with Mod I.  Patient is currently demonstrating behaviors consistent with a Rancho Level VI and requires overall Max A to complete functional and familiar tasks safely in regards to sustained attention, functional problem solving, intellectual awareness and working memory. Patient's biggest barrier to reaching her goals is fatigue and lethargy. Patient requires constant verbal and tactile cues to maintain arousal for ~30-60 seconds at a time with minimal awareness.  Patient/family education is ongoing. Patient would benefit from continued skilled SLP intervention to maximize cognitive and swallowing function and speech intelligibility in order  to maximize her functional independence prior to discharge.   Intensity: Minumum of 1-2 x/day, 30 to 90 minutes Frequency: 3 to 5 out of 7 days Duration/Length of Stay: TBD due to SNF placement  Treatment/Interventions: Cognitive remediation/compensation;Cueing hierarchy;Environmental controls;Functional tasks;Dysphagia/aspiration precaution  training;Patient/family education;Internal/external aids;Speech/Language facilitation;Therapeutic Activities     [place], [place] 10/01/2014, 3:53 PM
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
## 2014-10-02 ENCOUNTER — Inpatient Hospital Stay (HOSPITAL_COMMUNITY): Payer: Medicaid Other | Admitting: Physical Therapy

## 2014-10-02 ENCOUNTER — Inpatient Hospital Stay (HOSPITAL_COMMUNITY): Payer: Medicaid Other | Admitting: Occupational Therapy

## 2014-10-02 ENCOUNTER — Inpatient Hospital Stay (HOSPITAL_COMMUNITY): Payer: Self-pay | Admitting: Speech Pathology

## 2014-10-02 ENCOUNTER — Inpatient Hospital Stay (HOSPITAL_COMMUNITY): Payer: Medicaid Other

## 2014-10-02 ENCOUNTER — Inpatient Hospital Stay (HOSPITAL_COMMUNITY): Payer: Medicaid Other | Admitting: Speech Pathology

## 2014-10-02 LAB — PROTIME-INR
INR: 2.12 — ABNORMAL HIGH (ref 0.00–1.49)
Prothrombin Time: 23.6 seconds — ABNORMAL HIGH (ref 11.6–15.2)

## 2014-10-02 MED ORDER — WARFARIN SODIUM 7.5 MG PO TABS
17.5000 mg | ORAL_TABLET | Freq: Once | ORAL | Status: AC
  Administered 2014-10-02: 17.5 mg via ORAL
  Filled 2014-10-02: qty 2
  Filled 2014-10-02: qty 1

## 2014-10-02 NOTE — Progress Notes (Signed)
Physical Therapy Session Note  Patient Details  Name: [name] MRN: [id number] Date of Birth: 05-14-1965  Today's Date: 10/02/2014 PT Individual Time: 1000-1100 and 1400-1630 PT Individual Time Calculation (min): 60 min and 90 mii  Short Term Goals: Week 3:  PT Short Term Goal 1 (Week 3): pt will sustain attention during funcitonal task for 20 min with min A cues PT Short Term Goal 2 (Week 3): pt will perform A/P bed<>wheelchair transfer consistently with min A PT Short Term Goal 3 (Week 3): pt will perform all wheelchair parts management with supervision PT Short Term Goal 4 (Week 3): pt will verbalize and direct care entire sequence and technique for A/P wheelchair<>bed transfers  Skilled Therapeutic Interventions/Progress Updates:  Session 1 focused on functional mobility(see below for details), wheelchair management, and LE strengthening. Pt appeared to be more lethargic today repeatedly closing eyes and required increased cues for sustained attention and focused attention. Demonstrated wheelchair parts management with for brakes, and leg rests with return demonstration requiring max verbal cues for brakes and for doffing leg rests, pt able to don R leg rest and unable to don L leg rest. Pt performed A/P transfer from wheelchair to lower ht surface with supervision and max cuing for sequence, technique, management of LE's with L LE hooking underneath and assisting R LE onto mat. Pt required min A for A/P transfer back from mat to wheelchair with max multimodal cues,as pt could not recall or perform technique for managing R LE, and did not understand consequence of letting R LE fall from mat. Pt performed supine therex X 12 each AROM for L LE, and AAROM for R LE hip abd, add, SAQ, SLR, and heel slides. Pt performed wheelchair pushups 3 sets of 10 with cuing for technique. Pt left in room in wheelchair with legs elevated and nurse present with all needs in reach.  Session 2 focused on activity  tolerance, community wheelchair mobility, direction following, sustained attention, and problem soloving. Pt performed A/P transfers bed<>chair X 2 with min A for R LE. Pt propelled wheelchair overall 500 ft (including 100 ft over brick outside in community environment), and over rugs requiring several extended rest breaks and more than reasonable amount of time, and supervision for obstacle navigation and technique. Pt performed functional cooking task of directing SPT in making brownies while assisting with mixing ingredients in more than reasonable amount of time and max verbal cuing for problem solving, direction following, and for sustained attention due to continued lethargy. Pt returned to room and left in bed with nurse present and all needs in reach.   Therapy Documentation Precautions:  Precautions Precautions: Fall Precaution Comments: combative at times Required Braces or Orthoses: Other Brace/Splint Cervical Brace: At all times Other Brace/Splint: Right PRAFO Restrictions Weight Bearing Restrictions: Yes RLE Weight Bearing: Non weight bearing LLE Weight Bearing: Non weight bearing Other Position/Activity Restrictions: ROM BLE ok EXCEPT R ankle  Pain: Pain Assessment Pain Assessment: Faces Pain Score: Asleep Faces Pain Scale: Hurts little more Pain Type: Surgical pain Pain Location: Leg Pain Orientation: Right Pain Descriptors / Indicators: Aching Pain Onset: On-going Pain Intervention(s): Rest;Repositioned  Function:  Toileting Toileting             Bed Mobility Roll left and right activity   Assist level: Supervision or verbal cues    Sit to lying activity   Assist level: Touching or steadying assistance (Pt > 75%, lift 1 leg)    Lying to sitting activity   Assist  level: Touching or steadying assistance (Pt > 75%, lift 1 leg)    Mobility details Bed mobility details: Tactile cues for sequencing;Verbal cues for techniques;Verbal cues for sequencing    Transfers Sit to stand transfer        Chair/bed transfer   Chair/bed transfer method: Anterior/posterior Chair/bed transfer assist level: Touching or steadying assistance (Pt > 75%) Chair/bed transfer assistive device: Armrests   Chair/bed transfer details: Manual facilitation for weight shifting;Verbal cues for precautions/safety;Verbal cues for sequencing;Verbal cues for technique   Toilet transfer                Car transfer          Locomotion Ambulation          Walk 10 feet activity      Walk 50 feet with 2 turns activity      Walk 150 feet activity      Walk 10 feet on uneven surfaces activity      Stairs          Walk up/down 1 step activity        Walk up/down 4 steps activity      Walk up/down 12 steps activity      Pick up small objects from floor      Wheelchair   Type: Manual Max wheelchair distance: 150 ft Assist Level: Supervision or verbal cues  Wheel 50 feet with 2 turns activity   Assist Level: Supervision or verbal cues  Wheel 150 feet activity   Assist Level: Supervision or verbal cues   Cognition Comprehension Comprehension assist level: Understands basic 50 - 74% of the time/ requires cueing 25 - 49% of the time  Expression Expression assist level: Expresses basic 50 - 74% of the time/requires cueing 25 - 49% of the time. Needs to repeat parts of sentences.  Social Interaction Social Interaction assist level: Interacts appropriately 75 - 89% of the time - Needs redirection for appropriate language or to initiate interaction.  Problem Solving Problem solving assist level: Solves basic 50 - 74% of the time/requires cueing 25 - 49% of the time  Memory Memory assist level: Recognizes or recalls 25 - 49% of the time/requires cueing 50 - 75% of the time    Therapy/Group: Individual Therapy  [name] 10/02/2014, 11:13 AM

## 2014-10-02 NOTE — Progress Notes (Signed)
ANTICOAGULATION CONSULT NOTE - Follow Up Consult  Pharmacy Consult for coumadin Indication: VTE prophylaxis  Allergies  Allergen Reactions  . Codeine Nausea And Vomiting  . Codeine Nausea And Vomiting  . Vicodin [Hydrocodone-Acetaminophen] Nausea And Vomiting  . Vicodin [Hydrocodone-Acetaminophen] Nausea And Vomiting    Patient Measurements: Weight: 170 lb 12.8 oz (77.474 kg) Heparin Dosing Weight:   Vital Signs: Temp: 98.4 F (36.9 C) (08/24 0550) Temp Source: Oral (08/24 0550) BP: 145/81 mmHg (08/24 0550) Pulse Rate: 79 (08/24 0550)  Labs:  Recent Labs  10/01/14 0618 10/01/14 0856 10/02/14 0604  LABPROT >90.0* 22.6* 23.6*  INR >10.00* 2.01* 2.12*    Estimated Creatinine Clearance: 88.5 mL/min (by C-G formula based on Cr of 0.65).   Medications:  Scheduled:  . bethanechol  10 mg Oral TID  . cephALEXin  250 mg Oral 3 times per day  . chlorhexidine  15 mL Mouth Rinse BID  . clonazePAM  0.5 mg Oral BID  . escitalopram  20 mg Oral Daily  . fentaNYL  50 mcg Transdermal Q72H  . metoprolol tartrate  50 mg Oral BID  . multivitamin with minerals  1 tablet Oral Daily  . pneumococcal 23 valent vaccine  0.5 mL Intramuscular Tomorrow-1000  . polyethylene glycol  17 g Oral BID  . QUEtiapine  200 mg Oral QHS  . QUEtiapine  50 mg Oral Daily  . senna-docusate  2 tablet Oral QHS  . valproic acid  500 mg Oral BID  . Warfarin - Pharmacist Dosing Inpatient   Does not apply q1800   Infusions:    Assessment: 49 yo female is currently on therapeutic coumadin for VTE px.  INR today is 2.12.  Goal of Therapy:  INR 2-3 Monitor platelets by anticoagulation protocol: Yes   Plan:  Coumadin 17.5 mg PO x 1 tonight  Daily INR Monitor for bleeding complications.   Margeaux Swantek, Tsz-Yin 10/02/2014,8:23 AM

## 2014-10-02 NOTE — Progress Notes (Signed)
Speech Language Pathology Daily Session Note  Patient Details  Name: [name] MRN: [id number] Date of Birth: 03-22-1965  Today's Date: 10/02/2014 SLP Individual Time: 1140-1210 SLP Individual Time Calculation (min): 30 min  Short Term Goals: Week 3: SLP Short Term Goal 1 (Week 3): Patient will consume current diet with minimal overt s/s of aspiration and Min A verbal cues for use of swallowing compensatory strategies.  SLP Short Term Goal 2 (Week 3): Patient will sustain attention to a functional task for 2 minutes with Mod A verbal cues for redirection.  SLP Short Term Goal 3 (Week 3): Patient will demonstrate functional problem solving for basic and familiar tasks with Mod A multimodal cues.  SLP Short Term Goal 4 (Week 3): Patient will utilize external memory aids for recall of functional information with Min A multimodal cues.  SLP Short Term Goal 5 (Week 3): Patient will self-monitor and correct errors with functional tasks with Max A multimodal cues.  SLP Short Term Goal 6 (Week 3): Patient will utilize an increased vocal intensity to maximize speech intelligibility at the sentence level to 100% with Mod I.   Skilled Therapeutic Interventions: Skilled treatment session focused on dysphagia goals. SLP facilitated session by providing skilled observation with lunch meal of Dys. 3 textures with thin liquids. Patient did not demonstrate any overt s/s of aspiration and required Mod A verbal cues for use of small bites and to maintain arousal for ~2 minutes throughout self-feeding.  Recommend patient continue current diet.  Patient also required Min A verbal cues for use of an increased vocal intensity, suspect impacted by fatigue. Patient left upright in wheelchair with quick release belt in place with all needs within reach. Continue with current plan of care.    Function:  Eating Eating   Modified Consistency Diet: Yes Eating Assist Level: Helper checks for pocketed food;Supervision or  verbal cues   Eating Set Up Assist For: Opening containers       Cognition Comprehension Comprehension assist level: Understands basic 50 - 74% of the time/ requires cueing 25 - 49% of the time  Expression   Expression assist level: Expresses basic 50 - 74% of the time/requires cueing 25 - 49% of the time. Needs to repeat parts of sentences.  Social Interaction Social Interaction assist level: Interacts appropriately 75 - 89% of the time - Needs redirection for appropriate language or to initiate interaction.  Problem Solving Problem solving assist level: Solves basic 50 - 74% of the time/requires cueing 25 - 49% of the time  Memory Memory assist level: Recognizes or recalls 25 - 49% of the time/requires cueing 50 - 75% of the time    Pain Pain Assessment Pain Assessment: Faces Pain Score: 8  Faces Pain Scale: Hurts little more Pain Type: Surgical pain Pain Location: Foot Pain Orientation: Right Pain Descriptors / Indicators: Aching Pain Onset: On-going Pain Intervention(s): Medication (See eMAR);Repositioned  Therapy/Group: Individual Therapy  Brya Simerly 10/02/2014, 1:03 PM

## 2014-10-02 NOTE — Progress Notes (Signed)
Occupational Therapy Session Note  Patient Details  Name: [name] MRN: [id number] Date of Birth: 1966-01-21  Today's Date: 10/02/2014 OT Individual Time: 0834-1000 OT Individual Time Calculation (min): 86 min    Short Term Goals: Week 2:  OT Short Term Goal 1 (Week 2): Pt will don LB clothing with set up from bed level. OT Short Term Goal 2 (Week 2): Pt will bathe self with set up from bed level. OT Short Term Goal 3 (Week 2): Pt will transfer on/off BSC with mod A x1. OT Short Term Goal 4 (Week 2): Pt will be able to toilet with mod A for clothing management and set up for hygiene.  Skilled Therapeutic Interventions/Progress Updates:    Pt seen this session to facilitate BADL skills and functional mobility with focus on attention to task and activity tolerance. Pt very lethargic throughout the entire session, continually falling asleep.  Pt required max cues for all tasks from eating (as she was pocketing her food with too large bites) to completing a self care task (finish your bra before stopping to put on your sock). She had poor initiation and expressed confusion about current events in her life.  She was aware of month and day of week.  At start of session, needed to urinate but said it was too urgent to get on [facility] and stated she needed the bedpan.  Pt completed self care from bed. Transferred to w/c to complete grooming at sink. Pt in room with NT until next therapist arrived.  Therapy Documentation Precautions:  Precautions Precautions: Fall Precaution Comments: combative at times Required Braces or Orthoses: Other Brace/Splint Cervical Brace: At all times Other Brace/Splint: Right PRAFO Restrictions Weight Bearing Restrictions: Yes RLE Weight Bearing: Non weight bearing LLE Weight Bearing: Non weight bearing Other Position/Activity Restrictions: ROM BLE ok EXCEPT R ankle     Pain: No c/o pain during OT session  Function:   Eating Eating   Eating Assist Level:  Helper checks for pocketed food;Supervision or verbal cues   Eating Set Up Assist For: Opening containers       Grooming Oral Care,Brush Teeth, Clean Dentures Activity:      Assist Level: Set up   Set up : To obtain items  Wash, Rinse, Dry Face Activity   Assist Level: Set up      Wash, Rinse, Dry Hands Activity   Assist Level: Set up      Brush, Comb Hair Activity Brush,comb hair activity did not occur: N/A      Shave Activity Shave activity did not occur: N/A        Apply Makeup Activity Apply makeup activity did not occur: N/A                                                           Bathing Bathing position   Position: Bed  Bathing parts Body parts bathed by patient: Right arm;Left arm;Chest;Abdomen;Front perineal area;Right upper leg;Left upper leg;Buttocks;Left lower leg Body parts bathed by helper: Back  Bathing assist Assist Level: Supervision or verbal cues   Set up : To obtain items   Upper Body Dressing/Undressing Upper body dressing   What is the patient wearing?: Pull over shirt/dress;Bra Bra - Perfomed by patient: Thread/unthread right bra strap;Thread/unthread left bra strap;Hook/unhook bra (pull down sports  bra)   Pull over shirt/dress - Perfomed by patient: Thread/unthread right sleeve;Thread/unthread left sleeve;Put head through opening;Pull shirt over trunk          Upper body assist Assist Level: Supervision or verbal cues       Lower Body Dressing/Undressing Lower body dressing   What is the patient wearing?: Underwear;Pants;Socks Underwear - Performed by patient: Thread/unthread left underwear leg;Pull underwear up/down Underwear - Performed by helper: Thread/unthread right underwear leg Pants- Performed by patient: Thread/unthread left pants leg;Pull pants up/down Pants- Performed by helper: Thread/unthread right pants leg Non-skid slipper socks- Performed by patient: Don/doff left sock                    Lower body assist          Toileting Toileting          Toileting assist      Bed Mobility Roll left and right activity   Assist level: Supervision or verbal cues  Sit to lying activity   Assist level: Touching or steadying assistance (Pt > 75%, lift 1 leg)  Lying to sitting activity   Assist level: Touching or steadying assistance (Pt > 75%, lift 1 leg)  Mobility details Bed mobility details: Tactile cues for sequencing;Verbal cues for techniques;Verbal cues for sequencing   Transfers Sit to stand transfer        Chair/bed transfer   Chair/bed transfer method: Anterior/posterior Chair/bed transfer assist level: Touching or steadying assistance (Pt > 75%) Chair/bed transfer assistive device: Armrests   Chair/bed transfer details: Manual facilitation for weight shifting;Verbal cues for precautions/safety;Verbal cues for sequencing;Verbal cues for technique  Toilet transfer                Tub/shower transfer             Cognition Comprehension Comprehension assist level: Understands basic 50 - 74% of the time/ requires cueing 25 - 49% of the time  Expression Expression assist level: Expresses basic 50 - 74% of the time/requires cueing 25 - 49% of the time. Needs to repeat parts of sentences.  Social Interaction Social Interaction assist level: Interacts appropriately 75 - 89% of the time - Needs redirection for appropriate language or to initiate interaction.  Problem Solving Problem solving assist level: Solves basic 50 - 74% of the time/requires cueing 25 - 49% of the time  Memory Memory assist level: Recognizes or recalls 25 - 49% of the time/requires cueing 50 - 75% of the time    Therapy/Group: Individual Therapy  SAGUIER,JULIA 10/02/2014, 12:27 PM
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
## 2014-10-03 ENCOUNTER — Inpatient Hospital Stay (HOSPITAL_COMMUNITY): Payer: Medicaid Other | Admitting: Physical Therapy

## 2014-10-03 ENCOUNTER — Inpatient Hospital Stay (HOSPITAL_COMMUNITY): Payer: Medicaid Other | Admitting: Speech Pathology

## 2014-10-03 ENCOUNTER — Inpatient Hospital Stay (HOSPITAL_COMMUNITY): Payer: Self-pay | Admitting: Occupational Therapy

## 2014-10-03 ENCOUNTER — Inpatient Hospital Stay (HOSPITAL_COMMUNITY): Payer: Medicaid Other

## 2014-10-03 LAB — PROTIME-INR
INR: 2.35 — ABNORMAL HIGH (ref 0.00–1.49)
Prothrombin Time: 25.5 seconds — ABNORMAL HIGH (ref 11.6–15.2)

## 2014-10-03 MED ORDER — FENTANYL 25 MCG/HR TD PT72
25.0000 ug | MEDICATED_PATCH | TRANSDERMAL | Status: DC
  Administered 2014-10-04 – 2014-10-13 (×4): 25 ug via TRANSDERMAL
  Filled 2014-10-03 (×4): qty 1

## 2014-10-03 MED ORDER — WARFARIN SODIUM 7.5 MG PO TABS
17.5000 mg | ORAL_TABLET | Freq: Once | ORAL | Status: AC
  Administered 2014-10-03: 17.5 mg via ORAL
  Filled 2014-10-03: qty 2
  Filled 2014-10-03: qty 1
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
## 2014-10-03 NOTE — Progress Notes (Signed)
Occupational Therapy Note  Patient Details  Name: Narcissus Detwiler MRN: [id number] Date of Birth: 1965/07/30  Today's Date: 10/03/2014 OT Individual Time: 1300-1427 OT Individual Time Calculation (min): 87 min   Pt denied pain, however she stated she did not want OT to touch her legs because that would make them hurt Individual Therapy  Pt resting in bed upon arrival.  Pt had not eaten her lunch and requested she remain in bed to eat.  Pt required max encouragement to get OOB and finally agreed, reluctantly! Pt positioned herself correctly in bed to perform A/P transfer to w/c with supervision and steadying of w/c during transfer.  Pt required assistance placing leg rest on w/c.  Pt required more than a reasonable amount of time to complete task.  Pt engaged in self feeding with focus on attention to task, alertness, adhering to swallowing strategies.  Pt transitioned to w/c mobility tasks in home environment.  Pt exhibited increased lethargy this afternoon and required mod verbal cues for attending to tasks and keeping eyes open. Focus on bed mobility, functional transfers, safety awareness, w/c mobility, attention to task, alertness, problem solving, and path finding.  Lavone Neri [facility] 10/03/2014, 2:28 PM

## 2014-10-03 NOTE — Progress Notes (Signed)
Speech Language Pathology Daily Session Note  Patient Details  Name: [name] MRN: [id number] Date of Birth: November 21, 1965  Today's Date: 10/03/2014 SLP Individual Time: 1100-1130 SLP Individual Time Calculation (min): 30 min  Short Term Goals: Week 3: SLP Short Term Goal 1 (Week 3): Patient will consume current diet with minimal overt s/s of aspiration and Min A verbal cues for use of swallowing compensatory strategies.  SLP Short Term Goal 2 (Week 3): Patient will sustain attention to a functional task for 2 minutes with Mod A verbal cues for redirection.  SLP Short Term Goal 3 (Week 3): Patient will demonstrate functional problem solving for basic and familiar tasks with Mod A multimodal cues.  SLP Short Term Goal 4 (Week 3): Patient will utilize external memory aids for recall of functional information with Min A multimodal cues.  SLP Short Term Goal 5 (Week 3): Patient will self-monitor and correct errors with functional tasks with Max A multimodal cues.  SLP Short Term Goal 6 (Week 3): Patient will utilize an increased vocal intensity to maximize speech intelligibility at the sentence level to 100% with Mod I.   Skilled Therapeutic Interventions: Skilled treatment session focused on cognitive goals. SLP facilitated session by providing Mod A verbal and question cues for problem solving with a mildly complex, new learning task. Patient demonstrated increased alertness today but continued to require intermittent verbal cues to maintain arousal for ~5 minutes.  Patient also required Min-Mod A verbal cues for use of an increased vocal intensity at the phrase and sentence level to improve intelligibility. Patient left upright in wheelchair with all needs within reach and quick release belt in place. Continue with current plan of care.    Function:   Cognition Comprehension Comprehension assist level: Understands basic 50 - 74% of the time/ requires cueing 25 - 49% of the time  Expression    Expression assist level: Expresses basic 50 - 74% of the time/requires cueing 25 - 49% of the time. Needs to repeat parts of sentences.  Social Interaction Social Interaction assist level: Interacts appropriately 75 - 89% of the time - Needs redirection for appropriate language or to initiate interaction.  Problem Solving Problem solving assist level: Solves basic 50 - 74% of the time/requires cueing 25 - 49% of the time  Memory Memory assist level: Recognizes or recalls 25 - 49% of the time/requires cueing 50 - 75% of the time    Pain No reports of pain   Therapy/Group: Individual Therapy  Augustino Savastano 10/03/2014, 12:23 PM

## 2014-10-03 NOTE — Progress Notes (Signed)
ANTICOAGULATION CONSULT NOTE - Follow Up Consult  Pharmacy Consult for coumadin Indication: VTE prophylaxis  Allergies  Allergen Reactions  . Codeine Nausea And Vomiting  . Codeine Nausea And Vomiting  . Vicodin [Hydrocodone-Acetaminophen] Nausea And Vomiting  . Vicodin [Hydrocodone-Acetaminophen] Nausea And Vomiting    Patient Measurements: Weight: 170 lb 12.8 oz (77.474 kg) Heparin Dosing Weight:   Vital Signs: Temp: 99.4 F (37.4 C) (08/25 0552) Temp Source: Oral (08/25 0552) BP: 136/71 mmHg (08/25 0552) Pulse Rate: 76 (08/25 0552)  Labs:  Recent Labs  10/01/14 0856 10/02/14 0604 10/03/14 0714  LABPROT 22.6* 23.6* 25.5*  INR 2.01* 2.12* 2.35*    Estimated Creatinine Clearance: 88.5 mL/min (by C-G formula based on Cr of 0.65).   Medications:  Scheduled:  . bethanechol  10 mg Oral TID  . cephALEXin  250 mg Oral 3 times per day  . chlorhexidine  15 mL Mouth Rinse BID  . clonazePAM  0.5 mg Oral BID  . escitalopram  20 mg Oral Daily  . [START ON 10/04/2014] fentaNYL  25 mcg Transdermal Q72H  . metoprolol tartrate  50 mg Oral BID  . multivitamin with minerals  1 tablet Oral Daily  . pneumococcal 23 valent vaccine  0.5 mL Intramuscular Tomorrow-1000  . polyethylene glycol  17 g Oral BID  . QUEtiapine  200 mg Oral QHS  . QUEtiapine  50 mg Oral Daily  . senna-docusate  2 tablet Oral QHS  . valproic acid  500 mg Oral BID  . Warfarin - Pharmacist Dosing Inpatient   Does not apply q1800   Infusions:    Assessment: 49 yo female is currently on therapeutic coumadin for VTE prophyalxis.  INR is 2.35.  Goal of Therapy:  INR 2-3 Monitor platelets by anticoagulation protocol: Yes   Plan:  Coumadin 17.5 mg PO x 1 tonight  Daily INR Monitor for bleeding complications.   Tanysha Quant, Tsz-Yin 10/03/2014,8:30 AM

## 2014-10-03 NOTE — Progress Notes (Signed)
Occupational Therapy Session Note  Patient Details  Name: [name] MRN: [id number] Date of Birth: 1965-11-06  Today's Date: 10/03/2014 OT Individual Time: 0800-0900 OT Individual Time Calculation (min): 60 min    Short Term Goals: Week 2:  OT Short Term Goal 1 (Week 2): Pt will don LB clothing with set up from bed level. OT Short Term Goal 2 (Week 2): Pt will bathe self with set up from bed level. OT Short Term Goal 3 (Week 2): Pt will transfer on/off BSC with mod A x1. OT Short Term Goal 4 (Week 2): Pt will be able to toilet with mod A for clothing management and set up for hygiene.  Skilled Therapeutic Interventions/Progress Updates:    1:1 self care retraining at EOB with focus on bed mobility with management of bilateral LEs, selective attention to task with supervision, functional problem solving with basic and familiar self care tasks, lateral leans for cleansing bottom and periarea and clothing management. Pt required verbal and tactile cues for sequencing of lateral leans for bathing bottom and clothing management. Pt able to remain awake and alert for entire session due to constant movement demands. Pt returned to supine with HOB elevated to eat breakfast (pt's choice). See functional progress below for performance in ADL tasks.    Therapy Documentation Precautions:  Precautions Precautions: Fall Precaution Comments: combative at times Required Braces or Orthoses: Other Brace/Splint Cervical Brace: At all times Other Brace/Splint: Right PRAFO Restrictions Weight Bearing Restrictions: Yes RLE Weight Bearing: Non weight bearing LLE Weight Bearing: Non weight bearing Other Position/Activity Restrictions: ROM BLE ok EXCEPT R ankle Pain: No c/o pain in session  Function:    Wash, Rinse, Dry Face Activity   Assist Level: Set up      Wash, Rinse, Dry Hands Activity   Assist Level: Set up      Brush, Comb Hair Activity        Shave Activity          Apply  Makeup Activity                                                             Bathing Bathing position   Position: Sitting EOB  Bathing parts Body parts bathed by patient: Right arm;Left arm;Chest;Abdomen;Front perineal area;Right upper leg;Left upper leg Body parts bathed by helper: Left lower leg;Back;Buttocks  Bathing assist Assist Level: Supervision or verbal cues   Set up : To obtain items;To open containers   Upper Body Dressing/Undressing Upper body dressing   What is the patient wearing?: Bra;Pull over shirt/dress Bra - Perfomed by patient: Thread/unthread right bra strap;Thread/unthread left bra strap;Hook/unhook bra (pull down sports bra)   Pull over shirt/dress - Perfomed by patient: Thread/unthread right sleeve;Thread/unthread left sleeve;Put head through opening;Pull shirt over trunk          Upper body assist Assist Level: Set up   Set up : To obtain clothing/put away   Lower Body Dressing/Undressing Lower body dressing   What is the patient wearing?: Underwear;Pants Underwear - Performed by patient: Thread/unthread right underwear leg;Thread/unthread left underwear leg Underwear - Performed by helper: Pull underwear up/down Pants- Performed by patient: Thread/unthread right pants leg;Thread/unthread left pants leg Pants- Performed by helper: Pull pants up/down  Lower body assist         Toileting Toileting          Toileting assist      Bed Mobility Roll left and right activity      Sit to lying activity   Assist level: Supervision or verbal cues  Lying to sitting activity   Assist level: Supervision or verbal cues  Mobility details     Transfers Sit to stand transfer        Chair/bed transfer              Toilet transfer                Tub/shower transfer             Cognition Comprehension Comprehension assist level: Understands basic 50 - 74% of the time/ requires cueing 25 - 49% of the time   Expression Expression assist level: Expresses basic 50 - 74% of the time/requires cueing 25 - 49% of the time. Needs to repeat parts of sentences.  Social Interaction Social Interaction assist level: Interacts appropriately 75 - 89% of the time - Needs redirection for appropriate language or to initiate interaction.  Problem Solving Problem solving assist level: Solves basic 50 - 74% of the time/requires cueing 25 - 49% of the time  Memory Memory assist level: Recognizes or recalls 25 - 49% of the time/requires cueing 50 - 75% of the time    Therapy/Group: Individual Therapy  Roney Mans [facility] 10/03/2014, 8:52 AM

## 2014-10-03 NOTE — Progress Notes (Signed)
Social Work Patient ID: [name], female   DOB: 01/21/1966, 49 y.o.   MRN: [id number]   Have reviewed team conference with pt and her mother (via phone).  Have explained that, per MD, there are no immediate plans for further surgery according to ortho.  We will need to continue to work on SNF placement and she will have to come back to hospital at a later time for the surgery.  Am currently waiting for level 2 [place] to be completed.  Pt is upset that she will have to come back and states that she does not understand why it cannot be done sooner - will alert MD that she wants to speak with him about this.  Continue to follow.  Johnna Bollier, LCSW

## 2014-10-03 NOTE — Progress Notes (Signed)
Physical Therapy Session Note  Patient Details  Name: [name] MRN: [id number] Date of Birth: Aug 08, 1965  Today's Date: 10/03/2014 PT Individual Time: 0930-1030 and 1600-1630 PT Individual Time Calculation (min): 60 min and 30 min  Short Term Goals: Week 3:  PT Short Term Goal 1 (Week 3): pt will sustain attention during funcitonal task for 20 min with min A cues PT Short Term Goal 2 (Week 3): pt will perform A/P bed<>wheelchair transfer consistently with min A PT Short Term Goal 3 (Week 3): pt will perform all wheelchair parts management with supervision PT Short Term Goal 4 (Week 3): pt will verbalize and direct care entire sequence and technique for A/P wheelchair<>bed transfers  Skilled Therapeutic Interventions/Progress Updates:   Session 1 focused on functional mobility (see below for details), w/c parts management, and UE strengthening. Pt performed A/P transfer bed>wheelchair using leg lifter requiring supervision (bed mobility, scooting, and hip clearance part of transfer) to min A (managing R LE and max verbal/tactile cues to hook L LE underneath R LE to further assist lowering R LE off bed.) Pt performed wheelchair pushups 3 X 10 with supervision and min verbal cues for technique. Provided pt with max verbal cues and demonstration for wheelchair leg rest management, pt performed donning/doffing leg rests with min A for 2 attempts and supervision for 1 attempt requiring max multimodal cues. Pt left in room in wheelchair with all needs in reach and quick release belt on.  Session 2 focused on problem solving, recall, and sustained attention with cognitive game as pt declined OOB activity Pt was able to correctly answer 6 of 9 questions from SPT providing mod to max cues, and patient was able to correctly answer the remaining 3 questions when presented with three options. Pt then provided cues for SPT to formulate answer with pt providing apropriate clues for SPT to achieve 9 out of 10  items. Pt demonstrated improved recall, sustained attention, and activity engagement this session. Pt left in bed with all needs in reach, bed alarm set, kickstand applied to [facility], and nurse notified of pt requesting pain meds.  Therapy Documentation Precautions:  Precautions Precautions: Fall Precaution Comments: combative at times Required Braces or Orthoses: Other Brace/Splint Cervical Brace: At all times Other Brace/Splint: Right PRAFO Restrictions Weight Bearing Restrictions: Yes RLE Weight Bearing: Non weight bearing LLE Weight Bearing: Non weight bearing Other Position/Activity Restrictions: ROM BLE ok EXCEPT R ankle General:   Pain: Pain Assessment Pain Assessment: Faces Faces Pain Scale: Hurts a little bit Pain Type: Surgical pain Pain Location: Leg Pain Orientation: Right Pain Descriptors / Indicators: Aching Pain Onset: On-going Pain Intervention(s): Repositioned;Rest   Function:  Toileting Toileting             Bed Mobility Roll left and right activity   Assist level: Supervision or verbal cues    Sit to lying activity   Assist level: Supervision or verbal cues    Lying to sitting activity   Assist level: Supervision or verbal cues    Mobility details Bed mobility details: Verbal cues for techniques;Verbal cues for sequencing;Other (comment) (using leg reacher)   Transfers Sit to stand transfer        Chair/bed transfer   Chair/bed transfer method: Anterior/posterior Chair/bed transfer assist level: Touching or steadying assistance (Pt > 75%) Chair/bed transfer assistive device: Armrests   Chair/bed transfer details: Verbal cues for precautions/safety;Verbal cues for sequencing;Verbal cues for technique;Manual facilitation for placement   Toilet transfer  Car transfer          Locomotion Ambulation          Walk 10 feet activity      Walk 50 feet with 2 turns activity      Walk 150 feet activity      Walk 10  feet on uneven surfaces activity      Stairs          Walk up/down 1 step activity        Walk up/down 4 steps activity      Walk up/down 12 steps activity      Pick up small objects from floor      Wheelchair   Type: Manual Max wheelchair distance: 150 ft Assist Level: Supervision or verbal cues  Wheel 50 feet with 2 turns activity   Assist Level: Supervision or verbal cues  Wheel 150 feet activity   Assist Level: Supervision or verbal cues   Cognition Comprehension Comprehension assist level: Understands basic 50 - 74% of the time/ requires cueing 25 - 49% of the time  Expression Expression assist level: Expresses basic 50 - 74% of the time/requires cueing 25 - 49% of the time. Needs to repeat parts of sentences.  Social Interaction Social Interaction assist level: Interacts appropriately 75 - 89% of the time - Needs redirection for appropriate language or to initiate interaction.  Problem Solving Problem solving assist level: Solves basic 50 - 74% of the time/requires cueing 25 - 49% of the time  Memory Memory assist level: Recognizes or recalls 25 - 49% of the time/requires cueing 50 - 75% of the time    Therapy/Group: Individual Therapy  [name] 10/03/2014, 11:23 AM

## 2014-10-04 ENCOUNTER — Inpatient Hospital Stay (HOSPITAL_COMMUNITY): Payer: Medicaid Other | Admitting: Occupational Therapy

## 2014-10-04 ENCOUNTER — Inpatient Hospital Stay (HOSPITAL_COMMUNITY): Payer: Medicaid Other | Admitting: Speech Pathology

## 2014-10-04 ENCOUNTER — Inpatient Hospital Stay (HOSPITAL_COMMUNITY): Payer: Medicaid Other | Admitting: Physical Therapy

## 2014-10-04 ENCOUNTER — Inpatient Hospital Stay (HOSPITAL_COMMUNITY): Payer: Self-pay | Admitting: Physical Therapy

## 2014-10-04 LAB — COMPREHENSIVE METABOLIC PANEL
ALT: 11 U/L — ABNORMAL LOW (ref 14–54)
AST: 17 U/L (ref 15–41)
Albumin: 2.4 g/dL — ABNORMAL LOW (ref 3.5–5.0)
Alkaline Phosphatase: 80 U/L (ref 38–126)
Anion gap: 8 (ref 5–15)
BUN: 6 mg/dL (ref 6–20)
CO2: 25 mmol/L (ref 22–32)
Calcium: 8.7 mg/dL — ABNORMAL LOW (ref 8.9–10.3)
Chloride: 105 mmol/L (ref 101–111)
Creatinine, Ser: 0.61 mg/dL (ref 0.44–1.00)
GFR calc Af Amer: >60 mL/min (ref >60)
GFR calc non Af Amer: >60 mL/min (ref >60)
Glucose, Bld: 99 mg/dL (ref 65–99)
Potassium: 4 mmol/L (ref 3.5–5.1)
Sodium: 138 mmol/L (ref 135–145)
Total Bilirubin: 0.6 mg/dL (ref 0.3–1.2)
Total Protein: 5.6 g/dL — ABNORMAL LOW (ref 6.5–8.1)

## 2014-10-04 LAB — CBC
HCT: 36.4 % (ref 36.0–46.0)
Hemoglobin: 11.6 g/dL — ABNORMAL LOW (ref 12.0–15.0)
MCH: 29.1 pg (ref 26.0–34.0)
MCHC: 31.9 g/dL (ref 30.0–36.0)
MCV: 91.2 fL (ref 78.0–100.0)
Platelets: 322 10*3/uL (ref 150–400)
RBC: 3.99 MIL/uL (ref 3.87–5.11)
RDW: 15.9 % — ABNORMAL HIGH (ref 11.5–15.5)
WBC: 4.8 10*3/uL (ref 4.0–10.5)

## 2014-10-04 LAB — PROTIME-INR
INR: 2.42 — ABNORMAL HIGH (ref 0.00–1.49)
Prothrombin Time: 26 seconds — ABNORMAL HIGH (ref 11.6–15.2)

## 2014-10-04 MED ORDER — WARFARIN SODIUM 7.5 MG PO TABS
17.5000 mg | ORAL_TABLET | Freq: Once | ORAL | Status: AC
  Administered 2014-10-04: 17.5 mg via ORAL
  Filled 2014-10-04: qty 1

## 2014-10-04 NOTE — Progress Notes (Signed)
Occupational Therapy Weekly Progress Note  Patient Details  Name: [name] MRN: [id number] Date of Birth: 1965-10-03  Beginning of progress report period: September 26, 2014 End of progress report period: October 04, 2014  Today's Date: 10/04/2014 OT Individual Time: 0830-1000 OT Individual Time Calculation (min): 90 min    Patient has met 3 of 4 short term goals.  Pt has made progress with her ability to manage her legs for transfers, weight shifting skills on BSC for cleanup and clothing management, and overall activity tolerance and alertness.   Patient continues to demonstrate the following deficits: decreased strength in BUE for lifting hips during transfers, wrist discomfort with pushing body wt up, pain in BLE and fear of moving legs actively, decreased problem solving and therefore will continue to benefit from skilled OT intervention to enhance overall performance with BADL.  Patient not progressing toward long term goals.  See goal revision..  Plan of care revisions: LTGs of LB dressing and toileting downgraded to min A., UB dressing changed to S/u. BSC transfers upgraded to set up..  OT Short Term Goals Week 1:  OT Short Term Goal 1 (Week 1): Pt will don UB clothing with setup OT Short Term Goal 1 - Progress (Week 1): Met OT Short Term Goal 2 (Week 1): Pt will thread LB clothing with AE with mod A OT Short Term Goal 2 - Progress (Week 1): Met OT Short Term Goal 3 (Week 1): Pt will transfer to [facility] A/P with max A with 1 person OT Short Term Goal 3 - Progress (Week 1): Progressing toward goal OT Short Term Goal 4 (Week 1): Pt will perform 3/3 grooming at sink with setup OT Short Term Goal 4 - Progress (Week 1): Met Week 2:  OT Short Term Goal 1 (Week 2): Pt will don LB clothing with set up from bed level. OT Short Term Goal 1 - Progress (Week 2): Progressing toward goal (Pt can do underwear and sock, needs A with pants as they do not stretch) OT Short Term Goal 2 (Week 2): Pt  will bathe self with set up from bed level. OT Short Term Goal 2 - Progress (Week 2): Met OT Short Term Goal 3 (Week 2): Pt will transfer on/off BSC with mod A x1. OT Short Term Goal 3 - Progress (Week 2): Met OT Short Term Goal 4 (Week 2): Pt will be able to toilet with mod A for clothing management and set up for hygiene. OT Short Term Goal 4 - Progress (Week 2): Met Week 3:  OT Short Term Goal 1 (Week 3): Pt will be able to pull underwear over hips from [facility] with min A. OT Short Term Goal 2 (Week 3): Pt will be able transfer from [facility] posterior to wc with min A to manage RLE.  Skilled Therapeutic Interventions/Progress Updates:    Pt seen for BADL retraining with a focus on activity tolerance, adaptive techniques, functional mobility.  Pt stated,"I feel so much more alert and can focus. I do not have that Fentanyl patch on and I think that is why I am not sleepy".  Pt set up with sponge bath in bed, she completed all UB self care with Set up. Transferred posterior to [facility] with extra time and mod cues and set up to hold BSC. Pt toileted and completed LB self care from [facility].  Pt initiated all tasks well today and only needed min cues. She continues to need A to don L sock from [facility]  position and needs a great deal of A managing pants over hips due to tight pants that do not stretch. Pt then completed posterior transfer to W/C with A to manage R leg from bed onto leg rest as pt was nervous to try to lift it on her own.  Pt completed grooming from the chair.  Pt set up in w/c with quick release belt.     Therapy Documentation Precautions:  Precautions Precautions: Fall Precaution Comments: combative at times Required Braces or Orthoses: Other Brace/Splint Cervical Brace: At all times Other Brace/Splint: Right PRAFO Restrictions Weight Bearing Restrictions: Yes RLE Weight Bearing: Non weight bearing LLE Weight Bearing: Non weight bearing Other Position/Activity Restrictions: ROM BLE ok EXCEPT R  ankle       Pain: Pain Assessment Pain Assessment: No/denies pain  Function:   Eating Eating               Grooming Oral Care,Brush Teeth, Clean Dentures Activity:      Assist Level: Set up      Wash, Rinse, Dry Face Activity   Assist Level: Set up      Wash, Rinse, Dry Hands Activity   Assist Level: Set up      Brush, Comb Hair Activity   Assist Level: Set up to obtain items    Shave Activity Shave activity did not occur: N/A        Apply Makeup Activity                                                             Bathing Bathing position   Position: Bed (BSC)  Bathing parts Body parts bathed by patient: Right arm;Left arm;Chest;Abdomen;Front perineal area;Right upper leg;Left upper leg;Left lower leg;Buttocks Body parts bathed by helper: Back  Bathing assist Assist Level: Touching or steadying assistance(Pt > 75%)       Upper Body Dressing/Undressing Upper body dressing   What is the patient wearing?: Bra;Pull over shirt/dress Bra - Perfomed by patient: Thread/unthread right bra strap;Thread/unthread left bra strap;Hook/unhook bra (pull down sports bra)   Pull over shirt/dress - Perfomed by patient: Thread/unthread right sleeve;Thread/unthread left sleeve;Put head through opening;Pull shirt over trunk          Upper body assist     Set up : To obtain clothing/put away   Lower Body Dressing/Undressing Lower body dressing   What is the patient wearing?: Underwear;Pants;Non-skid slipper socks Underwear - Performed by patient: Thread/unthread left underwear leg;Pull underwear up/down Underwear - Performed by helper: Thread/unthread right underwear leg Pants- Performed by patient: Thread/unthread left pants leg Pants- Performed by helper: Pull pants up/down;Thread/unthread right pants leg   Non-skid slipper socks- Performed by helper: Don/doff left sock                  Lower body assist Assist Level: Touching or steadying assistance (Pt  > 75%)       Toileting Toileting   Toileting steps completed by patient: Performs perineal hygiene;Adjust clothing prior to toileting Toileting steps completed by helper: Adjust clothing after toileting    Toileting assist Assist level: Touching or steadying assistance (Pt.75%)    Bed Mobility Roll left and right activity      Sit to lying activity      Lying to sitting activity  Mobility details     Transfers Sit to stand transfer        Chair/bed transfer              Toilet transfer   Toilet transfer assistive device: Bedside commode Assist level to bedside commode (at bedside): Supervision or verbal cues Assist level from bedside commode (at bedside): Touching or steadying assistance (Pt > 75%)        Tub/shower transfer             Cognition Comprehension Comprehension assist level: Understands basic 50 - 74% of the time/ requires cueing 25 - 49% of the time  Expression Expression assist level: Expresses basic 90% of the time/requires cueing < 10% of the time.  Social Interaction Social Interaction assist level: Interacts appropriately 75 - 89% of the time - Needs redirection for appropriate language or to initiate interaction.  Problem Solving Problem solving assist level: Solves basic 50 - 74% of the time/requires cueing 25 - 49% of the time  Memory Memory assist level: Recognizes or recalls 50 - 74% of the time/requires cueing 25 - 49% of the time    Therapy/Group: Individual Therapy  [place] 10/04/2014, 12:09 PM

## 2014-10-04 NOTE — Progress Notes (Signed)
ANTICOAGULATION CONSULT NOTE - Follow Up Consult  Pharmacy Consult for couamdin Indication: VTE prophylaxis  Allergies  Allergen Reactions  . Codeine Nausea And Vomiting  . Codeine Nausea And Vomiting  . Vicodin [Hydrocodone-Acetaminophen] Nausea And Vomiting  . Vicodin [Hydrocodone-Acetaminophen] Nausea And Vomiting    Patient Measurements: Weight: 170 lb 12.8 oz (77.474 kg) Heparin Dosing Weight:   Vital Signs: Temp: 98.2 F (36.8 C) (08/26 0500) Temp Source: Oral (08/26 0500) BP: 123/66 mmHg (08/26 0500) Pulse Rate: 74 (08/26 0500)  Labs:  Recent Labs  10/02/14 0604 10/03/14 0714 10/04/14 0650  HGB  --   --  11.6*  HCT  --   --  36.4  PLT  --   --  322  LABPROT 23.6* 25.5* 26.0*  INR 2.12* 2.35* 2.42*  CREATININE  --   --  0.61    Estimated Creatinine Clearance: 88.5 mL/min (by C-G formula based on Cr of 0.61).   Medications:  Scheduled:  . bethanechol  10 mg Oral TID  . cephALEXin  250 mg Oral 3 times per day  . chlorhexidine  15 mL Mouth Rinse BID  . clonazePAM  0.5 mg Oral BID  . escitalopram  20 mg Oral Daily  . fentaNYL  25 mcg Transdermal Q72H  . metoprolol tartrate  50 mg Oral BID  . multivitamin with minerals  1 tablet Oral Daily  . pneumococcal 23 valent vaccine  0.5 mL Intramuscular Tomorrow-1000  . polyethylene glycol  17 g Oral BID  . QUEtiapine  200 mg Oral QHS  . QUEtiapine  50 mg Oral Daily  . senna-docusate  2 tablet Oral QHS  . valproic acid  500 mg Oral BID  . Warfarin - Pharmacist Dosing Inpatient   Does not apply q1800   Infusions:    Assessment: 49 yo female is currently on therapeutic coumadin for VTE prophylaxis.  Goal of Therapy:  INR 2-3 Monitor platelets by anticoagulation protocol: Yes   Plan:  Coumadin 17.5 mg PO x 1 tonight  Daily INR Monitor for bleeding complications.  [name], Tsz-Yin 10/04/2014,8:20 AM

## 2014-10-04 NOTE — Progress Notes (Signed)
Physical Therapy Session Note  Patient Details  Name: [name] MRN: [id number] Date of Birth: 1965/02/24  Today's Date: 10/04/2014 PT Individual Time: 1130-1200 PT Individual Time Calculation (min): 30 min   Short Term Goals: Week 3:  PT Short Term Goal 1 (Week 3): pt will sustain attention during funcitonal task for 20 min with min A cues PT Short Term Goal 2 (Week 3): pt will perform A/P bed<>wheelchair transfer consistently with min A PT Short Term Goal 3 (Week 3): pt will perform all wheelchair parts management with supervision PT Short Term Goal 4 (Week 3): pt will verbalize and direct care entire sequence and technique for A/P wheelchair<>bed transfers  Skilled Therapeutic Interventions/Progress Updates:   Session focused on functional mobility (see details below) and LE strengthening. Pt demonstrated increased ability to scoot hips and manage LE's for A/P transfer mat<>chair requiring min assist only to lower R LE from mat to complete transfer. Pt is currently able to hook L LE underneath R LE to lift R LE onto mat(along with leg lifter) however pt can not perform this technique lowering from mat despite max multimodal cues. Pt performed supine therex X 10 bilat for SLR and hip abd AROM on L LE and AAROM on R LE. Pt experienced increased pain and started crying when kickstand was reapplied on PRAFO during therex and pt was reeducated about positioning of R LE and need for kickstand to keep R LE in proper alignment, pt still needs reinforcement. Nursing staff was notified for reinforcement to apply kickstand, and pt was left in wheelchair in room with all needs in reach and kickstand on PRAFO applied.  Therapy Documentation Precautions:  Precautions Precautions: Fall Precaution Comments: combative at times Required Braces or Orthoses: Other Brace/Splint Cervical Brace: At all times Other Brace/Splint: Right PRAFO Restrictions Weight Bearing Restrictions: Yes RLE Weight Bearing:  Non weight bearing LLE Weight Bearing: Non weight bearing Other Position/Activity Restrictions: ROM BLE ok EXCEPT R ankle    Pain: Pain Assessment Pain Assessment: Faces Faces Pain Scale: Hurts whole lot Pain Type: Surgical pain Pain Location: Foot Pain Orientation: Right Pain Descriptors / Indicators: Aching Pain Intervention(s): Repositioned;Emotional support   Function:  Toileting Toileting     Bed Mobility Roll left and right activity   Assist level: Supervision or verbal cues    Sit to lying activity   Assist level: Supervision or verbal cues    Lying to sitting activity   Assist level: Supervision or verbal cues    Mobility details Bed mobility details: Verbal cues for techniques;Verbal cues for sequencing;Other (comment)   Transfers Sit to stand transfer        Chair/bed transfer   Chair/bed transfer method: Anterior/posterior Chair/bed transfer assist level: Touching or steadying assistance (Pt > 75%) Chair/bed transfer assistive device: Armrests   Chair/bed transfer details: Verbal cues for precautions/safety;Verbal cues for sequencing;Verbal cues for technique;Manual facilitation for placement   Toilet transfer     Car transfer          Locomotion Ambulation          Walk 10 feet activity      Walk 50 feet with 2 turns activity      Walk 150 feet activity      Walk 10 feet on uneven surfaces activity      Stairs          Walk up/down 1 step activity        Walk up/down 4 steps activity  Walk up/down 12 steps activity      Pick up small objects from floor      Wheelchair   Type: Manual Max wheelchair distance: 50 ft (pt propelled 50 feet then SPT took over for time management) Assist Level: Supervision or verbal cues  Wheel 50 feet with 2 turns activity   Assist Level: Supervision or verbal cues  Wheel 150 feet activity       Cognition Comprehension Comprehension assist level: Understands basic 50 - 74% of the time/  requires cueing 25 - 49% of the time  Expression Expression assist level: Expresses basic 90% of the time/requires cueing < 10% of the time.  Social Interaction Social Interaction assist level: Interacts appropriately 75 - 89% of the time - Needs redirection for appropriate language or to initiate interaction.  Problem Solving Problem solving assist level: Solves basic 50 - 74% of the time/requires cueing 25 - 49% of the time  Memory Memory assist level: Recognizes or recalls 50 - 74% of the time/requires cueing 25 - 49% of the time    Therapy/Group: Individual Therapy  [name] 10/04/2014, 12:19 PM

## 2014-10-04 NOTE — Progress Notes (Signed)
Occupational Therapy Session Note  Patient Details  Name: [name] MRN: [id number] Date of Birth: 12-Nov-1965  Today's Date: 10/04/2014 OT Individual Time:1300-1430  OT Individual Time Calculation (min): 90 min      Short Term Goals: Week 1:  OT Short Term Goal 1 (Week 1): Pt will don UB clothing with setup OT Short Term Goal 1 - Progress (Week 1): Met OT Short Term Goal 2 (Week 1): Pt will thread LB clothing with AE with mod A OT Short Term Goal 2 - Progress (Week 1): Met OT Short Term Goal 3 (Week 1): Pt will [facility] to [facility] A/P with max A with 1 person OT Short Term Goal 3 - Progress (Week 1): Progressing toward goal OT Short Term Goal 4 (Week 1): Pt will perform 3/3 grooming at sink with setup OT Short Term Goal 4 - Progress (Week 1): Met Week 2:  OT Short Term Goal 1 (Week 2): Pt will don LB clothing with set up from bed level. OT Short Term Goal 1 - Progress (Week 2): Progressing toward goal (Pt can do underwear and sock, needs A with pants as they do not stretch) OT Short Term Goal 2 (Week 2): Pt will bathe self with set up from bed level. OT Short Term Goal 2 - Progress (Week 2): Met OT Short Term Goal 3 (Week 2): Pt will transfer on/off BSC with mod A x1. OT Short Term Goal 3 - Progress (Week 2): Met OT Short Term Goal 4 (Week 2): Pt will be able to toilet with mod A for clothing management and set up for hygiene. OT Short Term Goal 4 - Progress (Week 2): Met Week 3:  OT Short Term Goal 1 (Week 3): Pt will be able to pull underwear over hips from [facility] with min A. OT Short Term Goal 2 (Week 3): Pt will be able transfer from [facility] posterior to wc with min A to manage RLE.  Skilled Therapeutic Interventions/Progress Updates:    Pt. Sitting in wc---crying stating her Right knee was 10/10.   Repostioned kick stand to decrease strain on right knee.  Pt propelled wc to gym.  Did transfer from wc to mat with a/p technique and min assist.  Performed RUE exercises using 2# weights on  RUE and AROM on LUE with limitations with shoulder flex and abduction.  Pt. Transferred to wc using a/p technique and propelled wc back to room and left with all needs in place.    Educated pt and family throughout session on importance of Right foot being in proper alignment and not keeping in external rotation.   Therapy Documentation Precautions:  Precautions Precautions: Fall Precaution Comments: combative at times Required Braces or Orthoses: Other Brace/Splint Cervical Brace: At all times Other Brace/Splint: Right PRAFO Restrictions Weight Bearing Restrictions: Yes RLE Weight Bearing: Non weight bearing LLE Weight Bearing: Non weight bearing Other Position/Activity Restrictions: ROM BLE ok EXCEPT R ankle       Pain: Pain Assessment Pain Assessment: Faces Faces Pain Scale: Hurts whole lot Pain Type: Surgical pain Pain Location: Foot Pain Orientation: Right Pain Descriptors / Indicators: Aching Pain Intervention(s): Repositioned;Emotional support       Other Treatments:    Function:   Eating Eating               Grooming Oral Care,Brush Teeth, Clean Dentures Activity:      Assist Level: Set up      Wash, Rinse, Dry Face Activity   Assist Level: Set  up      Wash, Rinse, Dry Hands Activity   Assist Level: Set up      Brush, Comb Hair Activity   Assist Level: Set up to obtain items    Shave Activity Shave activity did not occur: N/A        Apply Makeup Activity                                                             Bathing Bathing position   Position: Bed (BSC)  Bathing parts Body parts bathed by patient: Right arm;Left arm;Chest;Abdomen;Front perineal area;Right upper leg;Left upper leg;Left lower leg;Buttocks Body parts bathed by helper: Back  Bathing assist Assist Level: Touching or steadying assistance(Pt > 75%)       Upper Body Dressing/Undressing Upper body dressing   What is the patient wearing?: Bra;Pull over  shirt/dress Bra - Perfomed by patient: Thread/unthread right bra strap;Thread/unthread left bra strap;Hook/unhook bra (pull down sports bra)   Pull over shirt/dress - Perfomed by patient: Thread/unthread right sleeve;Thread/unthread left sleeve;Put head through opening;Pull shirt over trunk          Upper body assist     Set up : To obtain clothing/put away   Lower Body Dressing/Undressing Lower body dressing   What is the patient wearing?: Underwear;Pants;Non-skid slipper socks Underwear - Performed by patient: Thread/unthread left underwear leg;Pull underwear up/down Underwear - Performed by helper: Thread/unthread right underwear leg Pants- Performed by patient: Thread/unthread left pants leg Pants- Performed by helper: Pull pants up/down;Thread/unthread right pants leg   Non-skid slipper socks- Performed by helper: Don/doff left sock                  Lower body assist Assist Level: Touching or steadying assistance (Pt > 75%)       Toileting Toileting   Toileting steps completed by patient: Performs perineal hygiene;Adjust clothing prior to toileting Toileting steps completed by helper: Adjust clothing after toileting    Toileting assist Assist level: Touching or steadying assistance (Pt.75%)    Bed Mobility Roll left and right activity   Assist level: Supervision or verbal cues  Sit to lying activity   Assist level: Supervision or verbal cues  Lying to sitting activity   Assist level: Supervision or verbal cues  Mobility details Bed mobility details: Verbal cues for techniques;Verbal cues for sequencing;Other (comment)   Transfers Sit to stand transfer        Chair/bed transfer   Chair/bed transfer method: Anterior/posterior Chair/bed transfer assist level: Touching or steadying assistance (Pt > 75%) Chair/bed transfer assistive device: Armrests   Chair/bed transfer details: Verbal cues for precautions/safety;Verbal cues for sequencing;Verbal cues for  technique;Manual facilitation for placement  Toilet transfer   Toilet transfer assistive device: Bedside commode Assist level to bedside commode (at bedside): Supervision or verbal cues Assist level from bedside commode (at bedside): Touching or steadying assistance (Pt > 75%)        Tub/shower transfer             Cognition Comprehension Comprehension assist level: Understands basic 50 - 74% of the time/ requires cueing 25 - 49% of the time  Expression Expression assist level: Expresses basic 90% of the time/requires cueing < 10% of the time.  Social Interaction Social Interaction assist level: Interacts  appropriately 75 - 89% of the time - Needs redirection for appropriate language or to initiate interaction.  Problem Solving Problem solving assist level: Solves basic 50 - 74% of the time/requires cueing 25 - 49% of the time  Memory Memory assist level: Recognizes or recalls 50 - 74% of the time/requires cueing 25 - 49% of the time    Therapy/Group: Individual Therapy  Lisa Roca 10/04/2014, 1:48 PM
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
## 2014-10-04 NOTE — Progress Notes (Signed)
Speech Language Pathology Daily Session Note  Patient Details  Name: [name] MRN: [id number] Date of Birth: 1965-11-17  Today's Date: 10/04/2014 SLP Individual Time: 1000-1030 SLP Individual Time Calculation (min): 30 min  Short Term Goals: Week 3: SLP Short Term Goal 1 (Week 3): Patient will consume current diet with minimal overt s/s of aspiration and Min A verbal cues for use of swallowing compensatory strategies.  SLP Short Term Goal 2 (Week 3): Patient will sustain attention to a functional task for 2 minutes with Mod A verbal cues for redirection.  SLP Short Term Goal 3 (Week 3): Patient will demonstrate functional problem solving for basic and familiar tasks with Mod A multimodal cues.  SLP Short Term Goal 4 (Week 3): Patient will utilize external memory aids for recall of functional information with Min A multimodal cues.  SLP Short Term Goal 5 (Week 3): Patient will self-monitor and correct errors with functional tasks with Max A multimodal cues.  SLP Short Term Goal 6 (Week 3): Patient will utilize an increased vocal intensity to maximize speech intelligibility at the sentence level to 100% with Mod I.   Skilled Therapeutic Interventions: Skilled treatment session focused on cognitive and speech goals. Upon arrival, patient was awake while performing self-care tasks independently at the sink. Patient was able to divide attention between self-care tasks and functional conversation for 10 minutes with supervision verbal cues and recalled events from morning therapy session with Mod I. Patient maintained arousal and alertness throughout the entire session with Mod I. Patient also participated in a verbal description task and was Mod I for reasoning and problem solving and with use of an increased vocal intensity and was 100% intelligible at the sentence level.  Patient left upright in wheelchair with all needs within reach and quick release belt in place. Continue with current plan of  care.    Function:  Cognition Comprehension Comprehension assist level: Understands basic 50 - 74% of the time/ requires cueing 25 - 49% of the time  Expression   Expression assist level: Expresses basic 50 - 74% of the time/requires cueing 25 - 49% of the time. Needs to repeat parts of sentences.  Social Interaction Social Interaction assist level: Interacts appropriately 75 - 89% of the time - Needs redirection for appropriate language or to initiate interaction.  Problem Solving Problem solving assist level: Solves basic 50 - 74% of the time/requires cueing 25 - 49% of the time  Memory Memory assist level: Recognizes or recalls 50 - 74% of the time/requires cueing 25 - 49% of the time    Pain Pain Assessment Pain Assessment: No/denies pain  Therapy/Group: Individual Therapy  Jayleen Scaglione 10/04/2014, 12:04 PM
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
## 2014-10-05 ENCOUNTER — Inpatient Hospital Stay (HOSPITAL_COMMUNITY): Payer: Self-pay

## 2014-10-05 DIAGNOSIS — N390 Urinary tract infection, site not specified: Secondary | ICD-10-CM

## 2014-10-05 DIAGNOSIS — A499 Bacterial infection, unspecified: Secondary | ICD-10-CM

## 2014-10-05 LAB — PROTIME-INR
INR: 2.4 — ABNORMAL HIGH (ref 0.00–1.49)
Prothrombin Time: 25.9 seconds — ABNORMAL HIGH (ref 11.6–15.2)

## 2014-10-05 MED ORDER — WARFARIN SODIUM 2.5 MG PO TABS
17.5000 mg | ORAL_TABLET | Freq: Once | ORAL | Status: AC
  Administered 2014-10-05: 17.5 mg via ORAL
  Filled 2014-10-05: qty 1
  Filled 2014-10-05: qty 2

## 2014-10-05 MED ORDER — OXYCODONE HCL 5 MG PO TABS
10.0000 mg | ORAL_TABLET | ORAL | Status: DC | PRN
  Administered 2014-10-05 – 2014-10-24 (×37): 10 mg via ORAL
  Filled 2014-10-05 (×39): qty 2
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
## 2014-10-05 NOTE — Progress Notes (Signed)
ANTICOAGULATION CONSULT NOTE - Follow Up Consult  Pharmacy Consult for warfarin Indication: VTE prophylaxis  Allergies  Allergen Reactions  . Codeine Nausea And Vomiting  . Codeine Nausea And Vomiting  . Vicodin [Hydrocodone-Acetaminophen] Nausea And Vomiting  . Vicodin [Hydrocodone-Acetaminophen] Nausea And Vomiting    Patient Measurements: Weight: 170 lb 12.8 oz (77.474 kg)  Vital Signs: Temp: 98 F (36.7 C) (08/27 0558) Temp Source: Oral (08/27 0558) BP: 126/60 mmHg (08/27 0858) Pulse Rate: 68 (08/27 0858)  Labs:  Recent Labs  10/03/14 0714 10/04/14 0650 10/05/14 0555  HGB  --  11.6*  --   HCT  --  36.4  --   PLT  --  322  --   LABPROT 25.5* 26.0* 25.9*  INR 2.35* 2.42* 2.40*  CREATININE  --  0.61  --     Estimated Creatinine Clearance: 88.5 mL/min (by C-G formula based on Cr of 0.61).  Assessment:  49 y/o female currently therapeutic on warfarin for VTE prophylaxis. Hgb 11.6 plts 322. Current INR 2.42>2.4  Goal of Therapy:  INR 2-3 Monitor platelets by anticoagulation protocol: Yes   Plan:  Warfarin 17.5 mg po x 1 tonight Daily INR Monitor for S&S of bleed  [name], PharmD Pharmacy Resident Pager: [phone] 10/05/2014,10:02 AM

## 2014-10-05 NOTE — Progress Notes (Signed)
Physical Therapy Session Note  Patient Details  Name: [name] MRN: [id number] Date of Birth: 19-Oct-1965  Today's Date: 10/05/2014 PT Individual Time:  -      Short Term Goals: Week 3:  PT Short Term Goal 1 (Week 3): pt will sustain attention during funcitonal task for 20 min with min A cues PT Short Term Goal 2 (Week 3): pt will perform A/P bed<>wheelchair transfer consistently with min A PT Short Term Goal 3 (Week 3): pt will perform all wheelchair parts management with supervision PT Short Term Goal 4 (Week 3): pt will verbalize and direct care entire sequence and technique for A/P wheelchair<>bed transfers    Skilled Therapeutic Interventions/Progress Updates:  Pt supine on bedpan in bed; R PRAFO on..  PT removed bedpan and emptied.  Pt rolled and moved long sitting><supine repeatedly during quick bed bath of LB.  Pt donned panties and pants with mod assist, needing help to start them on RLE.    A-P transfer bed> w/c with min assist to initiate.  PT provided instruction and manual cues for head hips relationship to facilitate lifting hips onto cushion, and in scooping RLE up with LLE in order to move on bed.  Pt performed 5 reps lateral L><R movements bil LEs with max assist.  She required max assist to place RLe onto legrest of w/c.    W/c propulsion in hall using bil UEs x 150' including turns, supervision.  Instruction on efficient and functional turns.    Quick release belt applied and pt left resting in room in w/c.  Son present.  All needs left within reach.  PT recommended pt perform R hip IR in w/c, as RLe resting in hip ER. She refused outrigger on RPRAFO, due to pain.    Therapy Documentation Precautions:  Precautions Precautions: Fall Precaution Comments: combative at times Required Braces or Orthoses: Other Brace/Splint Cervical Brace: At all times Other Brace/Splint: Right PRAFO Restrictions Weight Bearing Restrictions: Yes RLE Weight Bearing: Non weight  bearing LLE Weight Bearing: Non weight bearing Other Position/Activity Restrictions: ROM BLE ok EXCEPT R ankle   Pain: rated 4/10, premedicated     Function:  Toileting Toileting   Toileting steps completed by patient: Performs perineal hygiene   Toileting Assistive Devices: Prosthesis/orthosis     Bed Mobility Roll left and right activity   Assist level: Supervision or verbal cues Roll left and right assistive device: Bedrails  Sit to lying activity     Sit to lying assistive device: Bedrails  Lying to sitting activity   Assist level: Supervision or verbal cues    Mobility details Bed mobility details: Verbal cues for techniques   Transfers Sit to stand transfer        Chair/bed transfer   Chair/bed transfer method: Anterior/posterior Chair/bed transfer assist level: Touching or steadying assistance (Pt > 75%)     Chair/bed transfer details: Verbal cues for precautions/safety;Verbal cues for sequencing;Verbal cues for technique;Manual facilitation for placement   Toilet transfer                Car transfer          Locomotion Ambulation Ambulation activity did not occur: Safety/medical concerns        Walk 10 feet activity      Walk 50 feet with 2 turns activity      Walk 150 feet activity      Walk 10 feet on uneven surfaces activity      Stairs Stairs activity did  not occur: Safety/medical concerns        Walk up/down 1 step activity   Stairs activity did not occur: Safety/medical concerns    Walk up/down 4 steps activity      Walk up/down 12 steps activity      Pick up small objects from floor      Wheelchair   Type: Manual Max wheelchair distance: 150 Assist Level: Supervision or verbal cues  Wheel 50 feet with 2 turns activity   Assist Level: Supervision or verbal cues  Wheel 150 feet activity   Assist Level: Supervision or verbal cues   Cognition Comprehension Comprehension assist level: Understands basic 75 - 89% of the time/  requires cueing 10 - 24% of the time  Expression Expression assist level: Expresses basic 90% of the time/requires cueing < 10% of the time.  Social Interaction Social Interaction assist level: Interacts appropriately 90% of the time - Needs monitoring or encouragement for participation or interaction.  Problem Solving Problem solving assist level: Solves basic 75 - 89% of the time/requires cueing 10 - 24% of the time  Memory Memory assist level: Recognizes or recalls 75 - 89% of the time/requires cueing 10 - 24% of the time (A-P transfer method)    Therapy/Group: Individual Therapy  Harriet Sutphen 10/05/2014, 5:48 PM

## 2014-10-06 ENCOUNTER — Inpatient Hospital Stay (HOSPITAL_COMMUNITY): Payer: Medicaid Other | Admitting: Occupational Therapy

## 2014-10-06 LAB — PROTIME-INR
INR: 2.58 — ABNORMAL HIGH (ref 0.00–1.49)
Prothrombin Time: 27.3 seconds — ABNORMAL HIGH (ref 11.6–15.2)

## 2014-10-06 MED ORDER — WARFARIN SODIUM 7.5 MG PO TABS
17.5000 mg | ORAL_TABLET | Freq: Once | ORAL | Status: AC
  Administered 2014-10-06: 17.5 mg via ORAL
  Filled 2014-10-06: qty 1
  Filled 2014-10-06: qty 2

## 2014-10-06 NOTE — Progress Notes (Signed)
Occupational Therapy Session Note  Patient Details  Name: [name] MRN: [id number] Date of Birth: Mar 14, 1965  Today's Date: 10/06/2014 OT Individual Time: 1400-1455  OT Individual Time Calculation (min): 55 min    Short Term Goals: Week 1:  OT Short Term Goal 1 (Week 1): Pt will don UB clothing with setup OT Short Term Goal 1 - Progress (Week 1): Met OT Short Term Goal 2 (Week 1): Pt will thread LB clothing with AE with mod A OT Short Term Goal 2 - Progress (Week 1): Met OT Short Term Goal 3 (Week 1): Pt will transfer to [facility] A/P with max A with 1 person OT Short Term Goal 3 - Progress (Week 1): Progressing toward goal OT Short Term Goal 4 (Week 1): Pt will perform 3/3 grooming at sink with setup OT Short Term Goal 4 - Progress (Week 1): Met Week 2:  OT Short Term Goal 1 (Week 2): Pt will don LB clothing with set up from bed level. OT Short Term Goal 1 - Progress (Week 2): Progressing toward goal (Pt can do underwear and sock, needs A with pants as they do not stretch) OT Short Term Goal 2 (Week 2): Pt will bathe self with set up from bed level. OT Short Term Goal 2 - Progress (Week 2): Met OT Short Term Goal 3 (Week 2): Pt will transfer on/off BSC with mod A x1. OT Short Term Goal 3 - Progress (Week 2): Met OT Short Term Goal 4 (Week 2): Pt will be able to toilet with mod A for clothing management and set up for hygiene. OT Short Term Goal 4 - Progress (Week 2): Met Week 3:  OT Short Term Goal 1 (Week 3): Pt will be able to pull underwear over hips from [facility] with min A. OT Short Term Goal 2 (Week 3): Pt will be able transfer from [facility] posterior to wc with min A to manage RLE.  Skilled Therapeutic Interventions/Progress Updates:    Pt. Lying in bed.  Discussed getting up but pt did not want to get up.  Pt agreed to exercise in bed.  Performed BUE AROM using 2 1/2 # weight on right arm and no on the left arm.Pt. Making goog gains with arm AROM.   Did anterior/posterior transfer to [facility]  with mod assist to toilet.  Pt managed clothes and peri care with increased time.   Pt was continent of bowel and bladder.  Left pt on [facility] with nursing.    Therapy Documentation Precautions:  Precautions Precautions: Fall Precaution Comments: combative at times Required Braces or Orthoses: Other Brace/Splint Cervical Brace: At all times Other Brace/Splint: Right PRAFO Restrictions Weight Bearing Restrictions: Yes RLE Weight Bearing: Non weight bearing LLE Weight Bearing: Non weight bearing Other Position/Activity Restrictions: ROM BLE ok EXCEPT R ankle   Pain:  Right foot  4/10               Function:   Eating Eating   Eating Assist Level: Set up assist for   Eating Set Up Assist For: Opening containers       Grooming Oral Care,Brush Teeth, Clean Dentures Activity:             Wash, Rinse, Dry Face Activity          Wash, Rinse, Dry Hands Activity          Brush, Comb Hair Activity        Shave Activity  Apply Makeup Activity                                                             Bathing Bathing position      Bathing parts      Bathing assist         Upper Body Dressing/Undressing Upper body dressing                    Upper body assist         Lower Body Dressing/Undressing Lower body dressing                                  Lower body assist         Toileting Toileting          Toileting assist      Bed Mobility Roll left and right activity      Sit to lying activity      Lying to sitting activity      Mobility details     Transfers Sit to stand transfer        Chair/bed transfer              Toilet transfer   Toilet transfer assistive device: Bedside commode Assist level to bedside commode (at bedside): Moderate assist (Pt 50 - 74%/lift or lower) Assist level from bedside commode (at bedside): Touching or steadying assistance (Pt > 75%)   Assist level to toilet:  Touching or steadying assistance (Pt > 75%) Assist level from toilet: Touching or steadying assistance (Pt > 75%)  Tub/shower transfer             Cognition Comprehension Comprehension assist level: Understands basic 75 - 89% of the time/ requires cueing 10 - 24% of the time  Expression Expression assist level: Expresses basic 90% of the time/requires cueing < 10% of the time.  Social Interaction Social Interaction assist level: Interacts appropriately 75 - 89% of the time - Needs redirection for appropriate language or to initiate interaction.  Problem Solving Problem solving assist level: Solves basic 75 - 89% of the time/requires cueing 10 - 24% of the time  Memory Memory assist level: Recognizes or recalls 75 - 89% of the time/requires cueing 10 - 24% of the time    Therapy/Group: Individual Therapy  Lisa Roca 10/06/2014, 3:23 PM
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
## 2014-10-06 NOTE — Progress Notes (Signed)
ANTICOAGULATION CONSULT NOTE - Follow Up Consult  Pharmacy Consult for warfarin Indication: VTE prophylaxis  Allergies  Allergen Reactions  . Codeine Nausea And Vomiting  . Codeine Nausea And Vomiting  . Vicodin [Hydrocodone-Acetaminophen] Nausea And Vomiting  . Vicodin [Hydrocodone-Acetaminophen] Nausea And Vomiting    Patient Measurements: Weight: 170 lb 12.8 oz (77.474 kg)  Vital Signs: Temp: 99.3 F (37.4 C) (08/28 0507) Temp Source: Oral (08/28 0507) BP: 122/70 mmHg (08/28 0820) Pulse Rate: 74 (08/28 0820)  Labs:  Recent Labs  10/04/14 0650 10/05/14 0555 10/06/14 0547  HGB 11.6*  --   --   HCT 36.4  --   --   PLT 322  --   --   LABPROT 26.0* 25.9* 27.3*  INR 2.42* 2.40* 2.58*  CREATININE 0.61  --   --     Estimated Creatinine Clearance: 88.5 mL/min (by C-G formula based on Cr of 0.61).  Assessment:  49 y/o female currently therapeutic on warfarin for VTE prophylaxis. Hgb 11.6 plts 322. Current INR 2.42>2.4>2.58  Goal of Therapy:  INR 2-3 Monitor platelets by anticoagulation protocol: Yes   Plan:  Warfarin 17.5 mg po x 1 tonight Daily INR Monitor for S&S of bleed  Sandi Carne, PharmD Pharmacy Resident Pager: [phone] 10/06/2014,10:57 AM

## 2014-10-06 NOTE — Progress Notes (Signed)
Refused to have kickstand on boot, out to right. Reports it's too painful. Did allow staff to prop foot with pillows. Refused scheduled miralax and senna s. 2 oxy IR given at 2132, for pain in right foot/ankle. Rested good during night.[name]

## 2014-10-07 ENCOUNTER — Inpatient Hospital Stay (HOSPITAL_COMMUNITY): Payer: Medicaid Other | Admitting: Occupational Therapy

## 2014-10-07 ENCOUNTER — Inpatient Hospital Stay (HOSPITAL_COMMUNITY): Payer: Medicaid Other | Admitting: Speech Pathology

## 2014-10-07 ENCOUNTER — Inpatient Hospital Stay (HOSPITAL_COMMUNITY): Payer: Medicaid Other

## 2014-10-07 DIAGNOSIS — S069X2S Unspecified intracranial injury with loss of consciousness of 31 minutes to 59 minutes, sequela: Secondary | ICD-10-CM

## 2014-10-07 LAB — PROTIME-INR
INR: 2.76 — ABNORMAL HIGH (ref 0.00–1.49)
Prothrombin Time: 28.7 seconds — ABNORMAL HIGH (ref 11.6–15.2)

## 2014-10-07 MED ORDER — WARFARIN SODIUM 7.5 MG PO TABS
17.5000 mg | ORAL_TABLET | Freq: Every day | ORAL | Status: DC
  Administered 2014-10-07: 17.5 mg via ORAL
  Filled 2014-10-07: qty 2
  Filled 2014-10-07: qty 1
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
## 2014-10-07 NOTE — Progress Notes (Signed)
ANTICOAGULATION CONSULT NOTE - Follow Up Consult  Pharmacy Consult for warfarin Indication: VTE prophylaxis  Allergies  Allergen Reactions  . Codeine Nausea And Vomiting  . Codeine Nausea And Vomiting  . Vicodin [Hydrocodone-Acetaminophen] Nausea And Vomiting  . Vicodin [Hydrocodone-Acetaminophen] Nausea And Vomiting    Patient Measurements: Weight: 170 lb 12.8 oz (77.474 kg)  Vital Signs: Temp: 98.4 F (36.9 C) (08/29 0447) Temp Source: Oral (08/29 0447) BP: 112/59 mmHg (08/29 0447) Pulse Rate: 75 (08/29 0447)  Labs:  Recent Labs  10/05/14 0555 10/06/14 0547 10/07/14 0410  LABPROT 25.9* 27.3* 28.7*  INR 2.40* 2.58* 2.76*    Estimated Creatinine Clearance: 88.5 mL/min (by C-G formula based on Cr of 0.61).  Assessment:  49 y/o female currently therapeutic on warfarin for VTE prophylaxis. Hgb 11.6 plts 322. Current INR 2.42>2.4>2.58  Goal of Therapy:  INR 2-3 Monitor platelets by anticoagulation protocol: Yes   Plan:  Warfarin 17.5 mg po daily Daily INR Monitor for S&S of bleed  Bayard Hugger, PharmD, BCPS  Clinical Pharmacist  Pager: [phone]   10/07/2014,12:05 PM

## 2014-10-07 NOTE — Plan of Care (Signed)
Problem: RH PAIN MANAGEMENT Goal: RH STG PAIN MANAGED AT OR BELOW PT'S PAIN GOAL Pain level 3 or less on a scale of 0-10.  Outcome: Not Progressing Consistently rates pain > 3.

## 2014-10-07 NOTE — Progress Notes (Signed)
Speech Language Pathology Daily Session Note  Patient Details  Name: [name] MRN: [id number] Date of Birth: 1965-05-23  Today's Date: 10/07/2014 SLP Individual Time: 1100-1200 SLP Individual Time Calculation (min): 60 min  Short Term Goals: Week 3: SLP Short Term Goal 1 (Week 3): Patient will consume current diet with minimal overt s/s of aspiration and Min A verbal cues for use of swallowing compensatory strategies.  SLP Short Term Goal 2 (Week 3): Patient will sustain attention to a functional task for 2 minutes with Mod A verbal cues for redirection.  SLP Short Term Goal 3 (Week 3): Patient will demonstrate functional problem solving for basic and familiar tasks with Mod A multimodal cues.  SLP Short Term Goal 4 (Week 3): Patient will utilize external memory aids for recall of functional information with Min A multimodal cues.  SLP Short Term Goal 5 (Week 3): Patient will self-monitor and correct errors with functional tasks with Max A multimodal cues.  SLP Short Term Goal 6 (Week 3): Patient will utilize an increased vocal intensity to maximize speech intelligibility at the sentence level to 100% with Mod I.   Skilled Therapeutic Interventions: Skilled treatment session focused on cognitive and dysphagia goals. SLP facilitated session by providing Max A question cues to verbally sequence steps for AP transfer from the wheelchair to the bed. Patient also required supervision A verbal cues for use of an increased vocal intensity at the sentence level. Patient consumed lunch meal of Dys. 3 textures with thin liquids without overt s/s of aspiration but required Mod A verbal cues for use of small bites and Min A verbal cues for alertness. Patient continues to require encouragement to sit up in wheelchair and not change the environment to facilitate fatigue (close blinds, turn up heat, etc) throughout sessions. Patient left upright in wheelchair with all needs within reach. Continue with current  plan of care.     Function:  Eating Eating   Modified Consistency Diet: Yes Eating Assist Level: Set up assist for;Supervision or verbal cues   Eating Set Up Assist For: Opening containers       Cognition Comprehension Comprehension assist level: Understands basic 75 - 89% of the time/ requires cueing 10 - 24% of the time  Expression   Expression assist level: Expresses basic 90% of the time/requires cueing < 10% of the time.  Social Interaction Social Interaction assist level: Interacts appropriately 75 - 89% of the time - Needs redirection for appropriate language or to initiate interaction.  Problem Solving Problem solving assist level: Solves basic 25 - 49% of the time - needs direction more than half the time to initiate, plan or complete simple activities  Memory Memory assist level: Recognizes or recalls 25 - 49% of the time/requires cueing 50 - 75% of the time    Pain Pain Assessment Pain Assessment: No/denies pain  Therapy/Group: Individual Therapy  Hartman Minahan 10/07/2014, 4:15 PM

## 2014-10-07 NOTE — Plan of Care (Signed)
Problem: RH SKIN INTEGRITY Goal: RH STG ABLE TO PERFORM INCISION/WOUND CARE W/ASSISTANCE STG Able To Perform Incision/Wound Care With Mod Assistance.  Outcome: Progressing Total assist with dressing changes

## 2014-10-07 NOTE — Progress Notes (Signed)
Physical Therapy Session Note  Patient Details  Name: [name] MRN: [id number] Date of Birth: [birth year]  Today's Date: 10/07/2014 PT Individual Time: 1000-1100; 4098-1191 PT Individual Time Calculation (min): 60 min, 60 min   Short Term Goals: Week 3:  PT Short Term Goal 1 (Week 3): pt will sustain attention during funcitonal task for 20 min with min A cues PT Short Term Goal 2 (Week 3): pt will perform A/P bed<>wheelchair transfer consistently with min A PT Short Term Goal 3 (Week 3): pt will perform all wheelchair parts management with supervision PT Short Term Goal 4 (Week 3): pt will verbalize and direct care entire sequence and technique for A/P wheelchair<>bed transfers    Skilled Therapeutic Interventions/Progress Updates:  tx 1  focused on neuro re-ed, sitting balance, tolerance of neutral R hip rotation  In supported sitting in w/c: with RLE lowered to 35 degrees flexion on legrest, with no c/o ankle pain, pt performed 20 x 1 each beach ball volley and bounce/ pass.    W/c> mat anterior scoot transfer with min/mod VCs for w/c set up in most efficient manner. Pt initiated sequencing steps of transfer verbally, but unable to complete. Consistent cues needed for brakes, especially L brake.  She initially stated mat should be raised to above knee ht when she is sitting in w/c.Mod./max assist for leg rest managed onto w/c.  neuromuscular re-education via demo, VCs in matside  sitting: AA bil hip adduction and neutral rotation, 15 x 1 hip adduction against resistance, R long arc quad knee ext , R hip abd/add with knee flex, hip flexion.    Gentle PROM R knee followed by AAROM.  Knee flex 55 degrees in sitting, feet barely supported.    Pt passed off to SLP for next session.  Tx 2:  Pt asleep but aroused easily to participate in tx. No c/o pain at start of session.  neuromuscular re-education via demo, VCS, manual cues, visual feedback for supine: AA R heel slides, R hip  abd/add, R straight leg raise, 10 x 1 each.  AA bil hip adduction with hip IR to promote alignment RLE, 3 x 10 each. AA L hip abd/add, L heel slides, L straight leg raises focusing on eccentric control  Therapeutic exercise performed to increase strength for functional mobility:. L ankle pumps, modified abdominal crunches, bil shoulder protraction,  2 x 10 each.  stretching L heel cord x 15 seconds x 3, to tolerance.  PT urged pt to get OOB for the remainder of the afternoon, but she declined.    Pt left resting in supine, RLE positioned to prevent R hip ER via pillows.  Pain was increased in R knee to 9/10.  Pt requested meds.  PT recommended pt request ice pack to R knee.  Bed alarm set and all needs left within reach.    Therapy Documentation Precautions:  Precautions Precautions: Fall Required Braces or Orthoses: Other Brace/Splint Cervical Brace: At all times Other Brace/Splint: Right PRAFO Restrictions Weight Bearing Restrictions: Yes RLE Weight Bearing: Non weight bearing LLE Weight Bearing: Non weight bearing Other Position/Activity Restrictions: ROM BLE ok EXCEPT R ankle   Pain: 5/10 R lower leg, premedicated, AM      Function:  Toileting Toileting      Bed Mobility Roll left and right activity     Sit to lying activity       Lying to sitting activity       Mobility details     Transfers Sit  to stand transfer Sit to stand activity did not occur: Safety/medical concerns      Chair/bed transfer   Chair/bed transfer method: Anterior/posterior Chair/bed transfer assist level: Set up only Chair/bed transfer assistive device: Armrests   Chair/bed transfer details: Verbal cues for precautions/safety;Verbal cues for sequencing;Verbal cues for technique;Manual facilitation for placement     Car transfer          Locomotion Ambulation          Walk 10 feet activity      Walk 50 feet with 2 turns activity      Walk 150 feet activity      Walk 10  feet on uneven surfaces activity      Stairs          Walk up/down 1 step activity        Walk up/down 4 steps activity      Walk up/down 12 steps activity      Pick up small objects from floor      Wheelchair          Wheel 50 feet with 2 turns activity      Wheel 150 feet activity       Cognition Comprehension Comprehension assist level: Understands basic 75 - 89% of the time/ requires cueing 10 - 24% of the time  Expression Expression assist level: Expresses basic 90% of the time/requires cueing < 10% of the time.  Social Interaction Social Interaction assist level: Interacts appropriately 75 - 89% of the time - Needs redirection for appropriate language or to initiate interaction.  Problem Solving Problem solving assist level: Solves basic 25 - 49% of the time - needs direction more than half the time to initiate, plan or complete simple activities  Memory Memory assist level: Recognizes or recalls 25 - 49% of the time/requires cueing 50 - 75% of the time    Therapy/Group: Individual Therapy  Hurshel Bouillon 10/07/2014, 12:33 PM

## 2014-10-07 NOTE — Progress Notes (Signed)
Occupational Therapy Session Note  Patient Details  Name: [name] MRN: [id number] Date of Birth: 08/08/1965  Today's Date: 10/07/2014 OT Individual Time: 0835-1000 OT Individual Time Calculation (min): 85 min    Short Term Goals: Week 3:  OT Short Term Goal 1 (Week 3): Pt will be able to pull underwear over hips from [facility] with min A. OT Short Term Goal 2 (Week 3): Pt will be able transfer from [facility] posterior to wc with min A to manage RLE.  Skilled Therapeutic Interventions/Progress Updates:    Pt seen for BADL retraining with a focus on pt initiating all tasks, problem solving during ADLs with min cues, activity tolerance, and use of BUE.  Pt needed to toilet and used BSC for toileting, bathing, and UB dressing. She transferred back to  Bed to complete pulling pants over hips.   Discussed how pt is uncomfortable with the kick stand on PRAFO but how important it is for her foot to be in neutral. Pt agreeable to using leg lifter to move foot into neutral by pulling on it with L hand. Pt was able to move foot into neutral, hold it for 10 sec, and repeated it 10x. Pt agreeable to completing the exercise several times during the day. Pt has desire to try another roll in shower, but continues to be apprehensive about holding her legs in dependent position. Discussed with pt that she needs to get used to having her legs down.  Pt agreed and transferred into chair with knees at 100 degrees flexion during grooming at the sink. Pt taken to the gym for her next therapy session.   Therapy Documentation Precautions:  Precautions Precautions: Fall Precaution Comments: combative at times Required Braces or Orthoses: Other Brace/Splint Cervical Brace: At all times Other Brace/Splint: Right PRAFO Restrictions Weight Bearing Restrictions: Yes RLE Weight Bearing: Non weight bearing LLE Weight Bearing: Non weight bearing Other Position/Activity Restrictions: ROM BLE ok EXCEPT R ankle  Pain: Pain  Assessment Pain Assessment: No/denies pain    Function:   Eating Eating               Grooming Oral Care,Brush Teeth, Clean Dentures Activity:      Assist Level: More than reasonable amount of time      Wash, Rinse, Dry Face Activity   Assist Level: More than reasonable time      Wash, Rinse, Dry Hands Activity   Assist Level: More than reasonable time      Brush, Comb Hair Activity Brush,comb hair activity did not occur: N/A      Shave Activity Shave activity did not occur: N/A        Apply Makeup Activity Apply makeup activity did not occur: Patient does not wear makeup                                                           Bathing Bathing position   Position:  (BSC with legs elevated on bed)  Bathing parts Body parts bathed by patient: Right arm;Left arm;Chest;Abdomen;Front perineal area;Right upper leg;Left upper leg;Left lower leg;Buttocks Body parts bathed by helper: Back  Bathing assist Assist Level: Set up       Upper Body Dressing/Undressing Upper body dressing   What is the patient wearing?: Bra;Pull over shirt/dress Bra - Perfomed by  patient: Thread/unthread right bra strap;Thread/unthread left bra strap;Hook/unhook bra (pull down sports bra)   Pull over shirt/dress - Perfomed by patient: Thread/unthread right sleeve;Thread/unthread left sleeve;Put head through opening;Pull shirt over trunk          Upper body assist Assist Level: Set up   Set up : To obtain clothing/put away   Lower Body Dressing/Undressing Lower body dressing   What is the patient wearing?: Underwear;Pants;Non-skid slipper socks Underwear - Performed by patient: Thread/unthread left underwear leg;Pull underwear up/down Underwear - Performed by helper: Thread/unthread right underwear leg Pants- Performed by patient: Thread/unthread left pants leg;Pull pants up/down Pants- Performed by helper: Thread/unthread right pants leg Non-skid slipper socks- Performed by  patient: Don/doff left sock                    Lower body assist         Toileting Toileting   Toileting steps completed by patient: Performs perineal hygiene;Adjust clothing prior to toileting Toileting steps completed by helper: Adjust clothing after toileting    Toileting assist Assist level: Supervision or verbal cues    Bed Mobility Roll left and right activity   Assist level: Supervision or verbal cues  Sit to lying activity   Assist level: Supervision or verbal cues  Lying to sitting activity   Assist level: Supervision or verbal cues  Mobility details     Transfers Sit to stand transfer Sit to stand activity did not occur: Safety/medical concerns      Chair/bed transfer   Chair/bed transfer method: Anterior/posterior Chair/bed transfer assist level: Set up only Chair/bed transfer assistive device: Armrests      Toilet transfer   Toilet transfer assistive device: Bedside commode Assist level to bedside commode (at bedside): Set up only Assist level from bedside commode (at bedside): Set up only        Tub/shower transfer Tub/shower transfer activity did not occur: Safety/medical concerns           Cognition Comprehension Comprehension assist level: Understands basic 75 - 89% of the time/ requires cueing 10 - 24% of the time  Expression Expression assist level: Expresses basic 90% of the time/requires cueing < 10% of the time.  Social Interaction Social Interaction assist level: Interacts appropriately 75 - 89% of the time - Needs redirection for appropriate language or to initiate interaction.  Problem Solving Problem solving assist level: Solves basic 75 - 89% of the time/requires cueing 10 - 24% of the time  Memory Memory assist level: Recognizes or recalls 75 - 89% of the time/requires cueing 10 - 24% of the time    Therapy/Group: Individual Therapy  Kincaid Tiger 10/07/2014, 12:12 PM

## 2014-10-08 ENCOUNTER — Inpatient Hospital Stay (HOSPITAL_COMMUNITY): Payer: Medicaid Other | Admitting: Speech Pathology

## 2014-10-08 ENCOUNTER — Inpatient Hospital Stay (HOSPITAL_COMMUNITY): Payer: Medicaid Other | Admitting: Physical Therapy

## 2014-10-08 ENCOUNTER — Inpatient Hospital Stay (HOSPITAL_COMMUNITY): Payer: Medicaid Other | Admitting: Occupational Therapy

## 2014-10-08 LAB — PROTIME-INR
INR: 3.03 — ABNORMAL HIGH (ref 0.00–1.49)
Prothrombin Time: 30.9 seconds — ABNORMAL HIGH (ref 11.6–15.2)

## 2014-10-08 MED ORDER — WARFARIN SODIUM 5 MG PO TABS
10.0000 mg | ORAL_TABLET | Freq: Once | ORAL | Status: AC
  Administered 2014-10-08: 10 mg via ORAL
  Filled 2014-10-08: qty 2

## 2014-10-08 NOTE — Progress Notes (Signed)
Physical Therapy Weekly Progress Note  Patient Details  Name: [name] MRN: [id number] Date of Birth: 07-30-1965  Beginning of progress report period: October 01, 2014 End of progress report period: October 08, 2014  Today's Date: 10/08/2014 PT Individual Time: 1035-1100 and 1400-1500 PT Individual Time Calculation (min): 25 min and 60 min  Patient has met 2 of 4 short term goals due to slow inconsistent progress. Patient is currently demonstrating behaviors consistent with a [place]o Level VI and requires overall mod multimodal cues to complete functional, familiar tasks. Patient progressed to working on slide board transfers from AP transfers this date to improve independence with management of wheelchair parts and BLE positioning. Patient education ongoing.   Patient continues to demonstrate the following deficits: RLE > LLE pain, generalized weakness, poor positioning of RLE, decreased activity tolerance, impaired sustained attention, impaired problem solving, decreased intellectual awareness, impaired memory and therefore will continue to benefit from skilled PT intervention to enhance overall performance with activity tolerance, postural control, ability to compensate for deficits, functional use of  right upper extremity, right lower extremity, left upper extremity and left lower extremity, attention, awareness, coordination and knowledge of precautions.  Patient progressing toward long term goals. Continue plan of care.  PT Short Term Goals Week 3:  PT Short Term Goal 1 (Week 3): pt will sustain attention during funcitonal task for 20 min with min A cues PT Short Term Goal 1 - Progress (Week 3): Met PT Short Term Goal 2 (Week 3): pt will perform A/P bed<>wheelchair transfer consistently with min A PT Short Term Goal 2 - Progress (Week 3): Met PT Short Term Goal 3 (Week 3): pt will perform all wheelchair parts management with supervision PT Short Term Goal 3 - Progress (Week 3):  Progressing toward goal PT Short Term Goal 4 (Week 3): pt will verbalize and direct care entire sequence and technique for A/P wheelchair<>bed transfers PT Short Term Goal 4 - Progress (Week 3): Progressing toward goal Week 4:  PT Short Term Goal 1 (Week 4): Patient will sustain attention to functional task for 20 min with supervision.  PT Short Term Goal 2 (Week 4): Patient will transfer bed <> wheelchair with consistent supervision and mod multimodal cues for sequencing and technique.  PT Short Term Goal 3 (Week 4): Patient will manage leg rests during transfers with supervision.  PT Short Term Goal 4 (Week 4): Patient will verbalize sequence for AP or slide board transfer with min verbal cues.   Skilled Therapeutic Interventions/Progress Updates:   Session 1: Patient received resting in bed with RLE in full externally rotated position. When patient asked to verbalize why keeping RLE in neutral position is important, she was able to state that she needed to be able to get her leg in a straight position after leaving the hospital, but stated that she had been told it was ok to leave the kickstand off if towels and pillows were positioned to keep it neutral. Pointed out to patient that towel roll was at bottom of bed, pillows were displaced to the side, and RLE in full ER. Patient stated, "I don't want to argue with you," and continued to refuse kickstand. With increased time and min verbal cues for technique, patient performed A/P transfer bed > wheelchair with supervision/setup assist to position wheelchair and raise/lower hospital bed. Patient propelled wheelchair using BUE in controlled and home environments with supervision. Handoff to SLP.   Session 2: Patient asleep in wheelchair, aroused to voice but lethargic. Patient  propelled wheelchair using BUE x 180 ft in more than reasonable amount of time (10 min) with max verbal cues for efficient technique and obstacle negotiation. Patient instructed in  performed lateral scoot transfer with slide board due to improved tolerance keeping BLE in dependent position. Patient performed slide board transfer with min set up assist to place slide board, supervision overall with total multimodal cues for sequencing and technique. Patient able to manage leg rests with supervision and max cues and lowered RLE with use of leg lifter. Therapist placed kickstand out to the R with no complaint from patient in supine. Supine therex for strengthening: L ankle pumps x 20, quad sets BLE x 20, glute sets x 20. Patient then noticed kickstand was up and immediately requested that therapist remove kickstand and began crying. Patient transferred back to wheelchair via slide board with max verbal/demonstration cues for sequencing and technique and assist to remove slide board. Patient propelled wheelchair back to room and left sitting in wheelchair with all needs within reach.   Therapy Documentation Precautions:  Precautions Precautions: Fall Precaution Comments: combative at times Required Braces or Orthoses: Other Brace/Splint Cervical Brace: At all times Other Brace/Splint: Right PRAFO Restrictions Weight Bearing Restrictions: Yes RLE Weight Bearing: Non weight bearing LLE Weight Bearing: Non weight bearing Other Position/Activity Restrictions: ROM BLE ok EXCEPT R ankle Pain: Pain Assessment Pain Assessment: No/denies pain   Function:  Toileting Toileting Toileting             Bed Mobility Roll left and right activity     Sit to lying activity     Lying to sitting activity   Assist level: No help, No cues, assistive device, takes more than a reasonable amount of time    Mobility details Bed mobility details: Verbal cues for techniques   Transfers Sit to stand transfer        Chair/bed transfer   Chair/bed transfer method: Anterior/posterior Chair/bed transfer assist level: Supervision or verbal cues Chair/bed transfer assistive device:  Armrests;Orthosis   Chair/bed transfer details: Verbal cues for precautions/safety;Verbal cues for sequencing   Toilet transfer                Car transfer         Locomotion Ambulation          Walk 10 feet activity      Walk 50 feet with 2 turns activity      Walk 150 feet activity      Walk 10 feet on uneven surfaces activity      Stairs          Walk up/down 1 step activity        Walk up/down 4 steps activity      Walk up/down 12 steps activity      Pick up small objects from floor      Wheelchair   Type: Manual Max wheelchair distance: 150 ft Assist Level: Supervision or verbal cues  Wheel 50 feet with 2 turns activity   Assist Level: Supervision or verbal cues  Wheel 150 feet activity   Assist Level: Supervision or verbal cues    Cognition Comprehension Comprehension assist level: Understands basic 75 - 89% of the time/ requires cueing 10 - 24% of the time  Expression Expression assist level: Expresses basic 90% of the time/requires cueing < 10% of the time.  Social Interaction Social Interaction assist level: Interacts appropriately 90% of the time - Needs monitoring or encouragement for participation or interaction.  Problem Solving Problem solving assist level: Solves basic 50 - 74% of the time/requires cueing 25 - 49% of the time  Memory Memory assist level: Recognizes or recalls 50 - 74% of the time/requires cueing 25 - 49% of the time    Therapy/Group: Individual Therapy  Laretta Alstrom 10/08/2014, 12:57 PM

## 2014-10-08 NOTE — Progress Notes (Signed)
Occupational Therapy Session Note  Patient Details  Name: [name] MRN: [id number] Date of Birth: [birth year]  Today's Date: 10/08/2014 OT Individual Time: 0830-1000 OT Individual Time Calculation (min): 90 min    Short Term Goals: Week 3:  OT Short Term Goal 1 (Week 3): Pt will be able to pull underwear over hips from [facility] with min A. OT Short Term Goal 2 (Week 3): Pt will be able transfer from [facility] posterior to wc with min A to manage RLE.  Skilled Therapeutic Interventions/Progress Updates:    Pt seen this session to facilitate BADL retraining to include a shower. Pt anxious to shower but can only complete an A/P transfer safely. The roll in shower chair could not be placed close enough to the bed to allow a safe transfer as side rails can not be removed from bed and the shower chair's seat was spaced far back from leg rests.  The BSC placed at side of bed with back removed and shower chair placed behind BSC. Pt scooted posterior from bed to [facility] to roll in shower chair. Pt did well with her legs in a dependent position for 30 min to allow pt to complete shower. R leg covered with plastic bag. To dress pt transferred back to bed using the same technique in an anterior manner. She did need total A to lift B legs up onto BSC to scoot forward. Pt demonstrated increased initiation, activity tolerance but continues to need mod A with problem solving. She thought she could just scoot onto roll in shower chair not recalling that she continues to have great difficulty with lateral transfers. Discussed that this is a skill she should work on. Pt needed increased A with donning pants over feet as she only had mesh underwear and tighter pants and needs A with R foot. Pt resting in bed with nurse with pt.   Therapy Documentation Precautions:  Precautions Precautions: Fall Precaution Comments: combative at times Required Braces or Orthoses: Other Brace/Splint Cervical Brace: At all times Other  Brace/Splint: Right PRAFO Restrictions Weight Bearing Restrictions: Yes RLE Weight Bearing: Non weight bearing LLE Weight Bearing: Non weight bearing Other Position/Activity Restrictions: ROM BLE ok EXCEPT R ankle      Pain: Pain Assessment Pain Assessment: No/denies pain  Function:   Eating Eating   Eating Assist Level: Supervision or verbal cues;Set up assist for   Eating Set Up Assist For: Opening containers       Grooming Oral Care,Brush Teeth, Clean Dentures Activity:             Wash, Rinse, Dry Face Activity   Assist Level: More than reasonable time      Wash, Rinse, Dry Hands Activity   Assist Level: More than reasonable time      Brush, Comb Hair Activity        Shave Activity          Apply Makeup Activity                                                             Bathing Bathing position   Position: Shower (roll in shower chair)  Bathing parts Body parts bathed by patient: Right arm;Left arm;Chest;Abdomen;Front perineal area;Right upper leg;Left upper leg;Buttocks Body parts bathed by helper: Back;Left lower leg  Bathing  assist Assist Level: Set up   Set up : To obtain items   Upper Body Dressing/Undressing Upper body dressing   What is the patient wearing?: Bra;Pull over shirt/dress     Pull over shirt/dress - Perfomed by patient: Thread/unthread right sleeve;Thread/unthread left sleeve;Put head through opening;Pull shirt over trunk          Upper body assist Assist Level: Set up       Lower Body Dressing/Undressing Lower body dressing   What is the patient wearing?: Underwear;Pants;Non-skid slipper socks Underwear - Performed by patient: Pull underwear up/down;Thread/unthread left underwear leg Underwear - Performed by helper: Thread/unthread right underwear leg Pants- Performed by patient: Pull pants up/down Pants- Performed by helper: Thread/unthread right pants leg;Thread/unthread left pants leg   Non-skid slipper socks-  Performed by helper: Don/doff left sock                  Lower body assist Assist Level: Supervision or verbal cues       Toileting Toileting          Toileting assist      Bed Mobility Roll left and right activity   Assist level: No help, No cues, assistive device, takes more than a reasonable amount of time  Sit to lying activity   Assist level: No help, No cues, assistive device, takes more than a reasonable amount of time  Lying to sitting activity   Assist level: No help, No cues, assistive device, takes more than a reasonable amount of time  Mobility details     Transfers Sit to stand transfer        Chair/bed transfer              Toilet transfer                Tub/shower transfer   Tub/shower assistive device: Roll in shower chair         Cognition Comprehension Comprehension assist level: Understands basic 75 - 89% of the time/ requires cueing 10 - 24% of the time  Expression Expression assist level: Expresses basic 90% of the time/requires cueing < 10% of the time.  Social Interaction Social Interaction assist level: Interacts appropriately 90% of the time - Needs monitoring or encouragement for participation or interaction.  Problem Solving Problem solving assist level: Solves basic 50 - 74% of the time/requires cueing 25 - 49% of the time  Memory Memory assist level: Recognizes or recalls 50 - 74% of the time/requires cueing 25 - 49% of the time    Therapy/Group: Individual Therapy  SAGUIER,JULIA 10/08/2014, 11:32 AM

## 2014-10-08 NOTE — Progress Notes (Signed)
Physical Therapy Session Note  Patient Details  Name: [name] MRN: [id number] Date of Birth: 1965/05/12  Today's Date: 10/08/2014 PT Individual Time: 1535-1600 PT Individual Time Calculation (min): 25 min   Short Term Goals: Week 3:  PT Short Term Goal 1 (Week 3): pt will sustain attention during funcitonal task for 20 min with min A cues PT Short Term Goal 2 (Week 3): pt will perform A/P bed<>wheelchair transfer consistently with min A PT Short Term Goal 3 (Week 3): pt will perform all wheelchair parts management with supervision PT Short Term Goal 4 (Week 3): pt will verbalize and direct care entire sequence and technique for A/P wheelchair<>bed transfers  Skilled Therapeutic Interventions/Progress Updates:   Pt received in w/c asleep; lethargic upon awakening but willing to participate.  Pt participated in continued slideboard training as below with focus on memory and sequencing of transfer with pt verbalizing and demonstrating 75% of w/c positioning and set up with min cues from therapist for safe positioning and sequence of parts management; pt also required mod cues to recall full setup of slideboard transfer.  Pt performed actual slideboard with supervision but then continued to require mod cues and min A to sequence removal of slideboard, removal of w/c and sequence for use of leg lifter to bring each LE into the bed with supervision.  Performed rolling in bed as below to replace pad under patient and for positioning.  Pt left in bed with all items within reach.   Therapy Documentation Precautions:  Precautions Precautions: Fall Precaution Comments: combative at times Required Braces or Orthoses: Other Brace/Splint Cervical Brace: At all times Other Brace/Splint: Right PRAFO Restrictions Weight Bearing Restrictions: Yes RLE Weight Bearing: Non weight bearing LLE Weight Bearing: Non weight bearing Other Position/Activity Restrictions: ROM BLE ok EXCEPT R ankle Vital  Signs: Therapy Vitals Temp: 98.4 F (36.9 C) Temp Source: Oral Pulse Rate: 79 Resp: 18 BP: 136/77 mmHg Patient Position (if appropriate): Sitting Oxygen Therapy SpO2: 100 % O2 Device: Not Delivered Pain: Pain Assessment Pain Assessment: No/denies pain Pain Score: 7  Pain Type: Acute pain Pain Location: Leg Pain Orientation: Right Pain Descriptors / Indicators: Aching Pain Frequency: Intermittent Pain Onset: On-going Patients Stated Pain Goal: 2 Pain Intervention(s): Medication (See eMAR);Repositioned Multiple Pain Sites: No   Function:   Bed Mobility Roll left and right activity   Assist level: Supervision or verbal cues Roll left and right assistive device: Bedrails  Sit to lying activity   Assist level: Supervision or verbal cues Sit to lying assistive device: Bedrails;Other (comment) (leg lifter)  Lying to sitting activity   Assist level: No help, No cues, assistive device, takes more than a reasonable amount of time    Mobility details Bed mobility details: Verbal cues for techniques   Transfers Sit to stand transfer        Chair/bed transfer   Chair/bed transfer method: Lateral scoot Chair/bed transfer assist level: Supervision or verbal cues Chair/bed transfer assistive device: Sliding board;Orthosis   Chair/bed transfer details: Visual cues/gestures for sequencing;Verbal cues for sequencing;Verbal cues for technique;Verbal cues for safe use of DME/AE;Tactile cues for placement   Toilet transfer                Car transfer          Locomotion Ambulation          Walk 10 feet activity      Walk 50 feet with 2 turns activity      Walk 150 feet  activity      Walk 10 feet on uneven surfaces activity      Stairs          Walk up/down 1 step activity        Walk up/down 4 steps activity      Walk up/down 12 steps activity      Pick up small objects from floor      Wheelchair   Type: Manual Max wheelchair distance: 150  ft Assist Level: Supervision or verbal cues  Wheel 50 feet with 2 turns activity   Assist Level: Supervision or verbal cues  Wheel 150 feet activity   Assist Level: Supervision or verbal cues   Cognition Comprehension Comprehension assist level: Understands basic 50 - 74% of the time/ requires cueing 25 - 49% of the time  Expression Expression assist level: Expresses basic 90% of the time/requires cueing < 10% of the time.  Social Interaction Social Interaction assist level: Interacts appropriately with others with medication or extra time (anti-anxiety, antidepressant).  Problem Solving Problem solving assist level: Solves basic 50 - 74% of the time/requires cueing 25 - 49% of the time  Memory Memory assist level: Recognizes or recalls 50 - 74% of the time/requires cueing 25 - 49% of the time    Therapy/Group: Individual Therapy  [name] 10/08/2014, 4:12 PM

## 2014-10-08 NOTE — Progress Notes (Signed)
ANTICOAGULATION CONSULT NOTE - Follow Up Consult  Pharmacy Consult for warfarin Indication: VTE prophylaxis  Allergies  Allergen Reactions  . Codeine Nausea And Vomiting  . Codeine Nausea And Vomiting  . Vicodin [Hydrocodone-Acetaminophen] Nausea And Vomiting  . Vicodin [Hydrocodone-Acetaminophen] Nausea And Vomiting    Patient Measurements: Weight: 170 lb 12.8 oz (77.474 kg)  Vital Signs: Temp: 98.2 F (36.8 C) (08/30 0608) Temp Source: Oral (08/30 0608) BP: 127/80 mmHg (08/30 0608) Pulse Rate: 73 (08/30 0608)  Labs:  Recent Labs  10/06/14 0547 10/07/14 0410 10/08/14 0600  LABPROT 27.3* 28.7* 30.9*  INR 2.58* 2.76* 3.03*    Estimated Creatinine Clearance: 88.5 mL/min (by C-G formula based on Cr of 0.61).  Assessment:  49 y/o female currently therapeutic on warfarin for VTE prophylaxis. Current INR 3.03, high end therapeutic. Most recent hgb 11.6, plt 322 K on 8/26. No bleeding noted per chart.   Goal of Therapy:  INR 2-3 Monitor platelets by anticoagulation protocol: Yes   Plan:  Warfarin 10 mg po x 1 today Daily INR Monitor for S&S of bleed  Bayard Hugger, PharmD, BCPS  Clinical Pharmacist  Pager: [phone]   10/08/2014,11:07 AM

## 2014-10-08 NOTE — Progress Notes (Signed)
Speech Language Pathology Weekly Progress and Session Note  Patient Details  Name: [name] MRN: [id number] Date of Birth: May 26, 1965  Beginning of progress report period: October 01, 2014 End of progress report period: October 08, 2014  Today's Date: 10/08/2014 SLP Individual Time: 1100-1130 SLP Individual Time Calculation (min): 30 min  Short Term Goals: Week 3: SLP Short Term Goal 1 (Week 3): Patient will consume current diet with minimal overt s/s of aspiration and Min A verbal cues for use of swallowing compensatory strategies.  SLP Short Term Goal 1 - Progress (Week 3): Met SLP Short Term Goal 2 (Week 3): Patient will sustain attention to a functional task for 2 minutes with Mod A verbal cues for redirection.  SLP Short Term Goal 2 - Progress (Week 3): Met SLP Short Term Goal 3 (Week 3): Patient will demonstrate functional problem solving for basic and familiar tasks with Mod A multimodal cues.  SLP Short Term Goal 3 - Progress (Week 3): Met SLP Short Term Goal 4 (Week 3): Patient will utilize external memory aids for recall of functional information with Min A multimodal cues.  SLP Short Term Goal 4 - Progress (Week 3): Not met SLP Short Term Goal 5 (Week 3): Patient will self-monitor and correct errors with functional tasks with Max A multimodal cues.  SLP Short Term Goal 5 - Progress (Week 3): Met SLP Short Term Goal 6 (Week 3): Patient will utilize an increased vocal intensity to maximize speech intelligibility at the sentence level to 100% with Mod I.  SLP Short Term Goal 6 - Progress (Week 3): Not met    New Short Term Goals: Week 4: SLP Short Term Goal 1 (Week 4): Patient will consume current diet with minimal overt s/s of aspiration and supervision verbal cues for use of swallowing compensatory strategies.  SLP Short Term Goal 2 (Week 4): Patient will demonstrate efficient mastication of regular textures without overt s/s of aspiration with supervision verbal cues.  SLP  Short Term Goal 3 (Week 4): Patient will sustain attention to a functional task for 15 minutes with Min A verbal cues for redirection.  SLP Short Term Goal 4 (Week 4): Patient will utilize external memory aids for recall of functional information with Min A multimodal cues.  SLP Short Term Goal 5 (Week 4): Patient will demonstrate functional problem solving for basic and familiar tasks with Min A multimodal cues.  SLP Short Term Goal 6 (Week 4): Patient will utilize an increased vocal intensity to maximize speech intelligibility at the sentence level to 100% with Mod I.   Weekly Progress Updates: Patient has made minimal and inconsistent gains and has met 4 of 6 STG's this reporting period due to improved swallowing and cognitive function. Currently, patient is consuming Dys. 3 textures with thin liquids with minimal overt s/s of aspiration and requires Min-Mod A multimodal cues for use of swallowing compensatory strategies in regards to small bites and maintaining arousal for PO intake.  Patient is also ~90% intelligible at the phrase and sentence level due to increased vocal intensity with supervision verbal cues.  Patient is currently demonstrating behaviors consistent with a Rancho Level VI and requires overall Mod A multimodal cues to complete functional and familiar tasks safely in regards to sustained attention, functional problem solving, intellectual awareness and working memory. Patient/family education is ongoing. Patient would benefit from continued skilled SLP intervention to maximize cognitive and swallowing function and speech intelligibility in order to maximize her functional independence prior to discharge.  Intensity: Minumum of 1-2 x/day, 30 to 90 minutes Frequency: 3 to 5 out of 7 days Duration/Length of Stay: TBD due to SNF placement  Treatment/Interventions: Cognitive remediation/compensation;Cueing hierarchy;Environmental controls;Functional tasks;Dysphagia/aspiration precaution  training;Patient/family education;Internal/external aids;Speech/Language facilitation;Therapeutic Activities   Daily Session  Skilled Therapeutic Interventions: Skilled treatment session focused on cognitive goals. SLP facilitated session by providing supervision verbal cues for problem solving during a navigation task to the gift shop and Min A question cues for recall of the 3 items she needed to locate. Patient attended to task for ~20 minutes in a mildly distracting environment with supervision verbal cues. Patient was also ~90% intelligible with supervision verbal cues for use of an increased vocal intensity. Patient left upright in wheelchair with all needs within reach. Continue with current plan of care.       Function:   Cognition Comprehension Comprehension assist level: Understands basic 50 - 74% of the time/ requires cueing 25 - 49% of the time  Expression   Expression assist level: Expresses basic 90% of the time/requires cueing < 10% of the time.  Social Interaction Social Interaction assist level: Interacts appropriately 90% of the time - Needs monitoring or encouragement for participation or interaction.  Problem Solving Problem solving assist level: Solves basic 50 - 74% of the time/requires cueing 25 - 49% of the time  Memory Memory assist level: Recognizes or recalls 50 - 74% of the time/requires cueing 25 - 49% of the time   Pain Pain Assessment Pain Assessment: No/denies pain   Therapy/Group: Individual Therapy  Ski Polich 10/08/2014, 4:18 PM

## 2014-10-08 NOTE — Progress Notes (Signed)
Occupational Therapy Session Note  Patient Details  Name: [name] MRN: [id number] Date of Birth: 11-23-1965  Today's Date: 10/08/2014 OT Individual Time: 1300-1330 OT Individual Time Calculation (min): 30 min    Short Term Goals: Week 3:  OT Short Term Goal 1 (Week 3): Pt will be able to pull underwear over hips from [facility] with min A. OT Short Term Goal 2 (Week 3): Pt will be able transfer from [facility] posterior to wc with min A to manage RLE.  Skilled Therapeutic Interventions/Progress Updates:    Treatment session with focus on selective attention, awareness, problem solving, and LUE ROM.  Pt sitting in w/c upon arrival finishing lunch.  Engaged in discussion regarding current functional status, challenges, and problem solving to identify progress and potential solutions.  Engaged in LUE AROM and exercises as pt reporting occasional discomfort in Lt shoulder/pec area.  Pt with decreased arousal, often nodding off during session.    Therapy Documentation Precautions:  Precautions Precautions: Fall Precaution Comments: combative at times Required Braces or Orthoses: Other Brace/Splint Cervical Brace: At all times Other Brace/Splint: Right PRAFO Restrictions Weight Bearing Restrictions: Yes RLE Weight Bearing: Non weight bearing LLE Weight Bearing: Non weight bearing Other Position/Activity Restrictions: ROM BLE ok EXCEPT R ankle General:   Vital Signs: Therapy Vitals Temp: 98.4 F (36.9 C) Temp Source: Oral Pulse Rate: 79 Resp: 18 BP: 136/77 mmHg Patient Position (if appropriate): Sitting Oxygen Therapy SpO2: 100 % O2 Device: Not Delivered Pain: Pain Assessment Pain Assessment: 0-10 Pain Score: 7  Pain Type: Acute pain Pain Location: Leg Pain Orientation: Right Pain Descriptors / Indicators: Aching Pain Frequency: Intermittent Pain Onset: On-going Patients Stated Pain Goal: 2 Pain Intervention(s): Medication (See eMAR);Repositioned Multiple Pain Sites:  No    Therapy/Group: Individual Therapy  Rosalio Loud 10/08/2014, 3:23 PM
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
## 2014-10-08 NOTE — Progress Notes (Signed)
Pt's LBM documented was 10/03/14. Offered sorbital x2 during this shift, but pt refused. Pt has sch. senna @ HS and Miralax BID. Foye Clock [facility]

## 2014-10-09 ENCOUNTER — Inpatient Hospital Stay (HOSPITAL_COMMUNITY): Payer: Medicaid Other | Admitting: Physical Therapy

## 2014-10-09 ENCOUNTER — Inpatient Hospital Stay (HOSPITAL_COMMUNITY): Payer: Medicaid Other | Admitting: Occupational Therapy

## 2014-10-09 ENCOUNTER — Inpatient Hospital Stay (HOSPITAL_COMMUNITY): Payer: Medicaid Other | Admitting: Speech Pathology

## 2014-10-09 LAB — PROTIME-INR
INR: 2.89 — ABNORMAL HIGH (ref 0.00–1.49)
Prothrombin Time: 29.8 seconds — ABNORMAL HIGH (ref 11.6–15.2)

## 2014-10-09 MED ORDER — WARFARIN SODIUM 7.5 MG PO TABS
15.0000 mg | ORAL_TABLET | Freq: Once | ORAL | Status: AC
  Administered 2014-10-09: 15 mg via ORAL
  Filled 2014-10-09: qty 2

## 2014-10-09 NOTE — Progress Notes (Addendum)
Physical Therapy Session Note  Patient Details  Name: [name] MRN: [id number] Date of Birth: December 02, 1965  Today's Date: 10/09/2014 PT Individual Time: 1000-1100 and 1400-1500 PT Individual Time Calculation (min): 60 min and 60 min  Short Term Goals: Week 4:  PT Short Term Goal 1 (Week 4): Patient will sustain attention to functional task for 20 min with supervision.  PT Short Term Goal 2 (Week 4): Patient will transfer bed <> wheelchair with consistent supervision and mod multimodal cues for sequencing and technique.  PT Short Term Goal 3 (Week 4): Patient will manage leg rests during transfers with supervision.  PT Short Term Goal 4 (Week 4): Patient will verbalize sequence for AP or slide board transfer with min verbal cues.   Skilled Therapeutic Interventions/Progress Updates:   Session 1: Focus on activity tolerance, problem solving, recall, and attention with functional transfer training. Patient in gym, handoff from OT. Performed UBE with R leg rest donned/LLE in dependent position x 18 min with rest breaks as needed. Patient instructed in slide board transfer with greatly increased, more than reasonable amount of time and total multimodal cues for sequencing and technique, with most difficulty noted with set up and positioning of wheelchair in preparation for transfer. Patient required assist to safely place SB but completed transfer with supervision. With patient seated edge of mat, therapist sat in patient's wheelchair and had patient verbalize/instruct therapist on all steps for wheelchair setup and slide board transfer with mod multimodal cues and increased time. Patient returned to wheelchair via slide board with supervision overall, improved ability to remove slide board after transfers with mod verbal cues compared to yesterday. Patient will continue to benefit from slide board transfer training with focus on safe wheelchair set up. Patient propelled wheelchair back to room and left  sitting with all needs within reach.   Session 2: Focus on patient education, discharge planning, wheelchair mobility, and functional transfers. Patient requested to decrease amount of physical therapy received daily. Education provided on goals of PT, amount of therapy required daily, and discharge plan to SNF due to decreased caregiver support. Patient re-educated that R ankle surgery date is not planned at this time. Following education, patient stated, "So I could just go home tomorrow because you're not doing my surgery." Continued education on BLE NWB status and need for 24/7 supervision for safety as well as lack of caregiver support. Patient requested to go outside, propelled wheelchair towards elevator then requested to use bathroom. Returned to room and performed slide board transfer wheelchair <> drop arm BSC with therapist placing/stabilizing slide board, max multimodal cues for sequencing getting pants up/down and ultimately required +2 assist from NT to adjust clothing after toileting. Patient transported outside and propelled wheelchair using BUE over thresholds, on carpet, and outside on brick and uneven surfaces with supervision. Patient requested to sit in day room while room being cleaned, NT notified of patient location.    Therapy Documentation Precautions:  Precautions Precautions: Fall Precaution Comments: combative at times Required Braces or Orthoses: Other Brace/Splint Cervical Brace: At all times Other Brace/Splint: Right PRAFO Restrictions Weight Bearing Restrictions: Yes RLE Weight Bearing: Non weight bearing LLE Weight Bearing: Non weight bearing Other Position/Activity Restrictions: ROM BLE ok EXCEPT R ankle Pain: Pain Assessment Pain Assessment: No/denies pain   Function:  Toileting Toileting             Bed Mobility Roll left and right activity   Assist level: Supervision or verbal cues Roll left and right assistive  device: Bedrails  Sit to lying  activity   Assist level: Supervision or verbal cues    Lying to sitting activity   Assist level: No help, No cues, assistive device, takes more than a reasonable amount of time    Mobility details     Transfers Sit to stand transfer        Chair/bed transfer   Chair/bed transfer method: Lateral scoot Chair/bed transfer assist level: Supervision or verbal cues Chair/bed transfer assistive device: Sliding board;Orthosis   Chair/bed transfer details: Verbal cues for technique;Verbal cues for precautions/safety;Verbal cues for sequencing   Toilet transfer                Car transfer          Locomotion Ambulation          Walk 10 feet activity      Walk 50 feet with 2 turns activity      Walk 150 feet activity      Walk 10 feet on uneven surfaces activity      Stairs          Walk up/down 1 step activity        Walk up/down 4 steps activity      Walk up/down 12 steps activity      Pick up small objects from floor      Wheelchair   Type: Manual Max wheelchair distance: 150 ft Assist Level: Supervision or verbal cues  Wheel 50 feet with 2 turns activity   Assist Level: Supervision or verbal cues  Wheel 150 feet activity   Assist Level: Supervision or verbal cues   Cognition Comprehension Comprehension assist level: Understands basic 50 - 74% of the time/ requires cueing 25 - 49% of the time  Expression Expression assist level: Expresses basic 90% of the time/requires cueing < 10% of the time.  Social Interaction Social Interaction assist level: Interacts appropriately 90% of the time - Needs monitoring or encouragement for participation or interaction.  Problem Solving Problem solving assist level: Solves basic 50 - 74% of the time/requires cueing 25 - 49% of the time  Memory Memory assist level: Recognizes or recalls 50 - 74% of the time/requires cueing 25 - 49% of the time    Therapy/Group: Individual Therapy  Kerney Elbe 10/09/2014, 11:02  AM

## 2014-10-09 NOTE — Progress Notes (Signed)
Occupational Therapy Session Note  Patient Details  Name: [name] MRN: [id number] Date of Birth: 02-22-1965  Today's Date: 10/09/2014 OT Individual Time: 0830-1000 OT Individual Time Calculation (min): 90 min    Short Term Goals: Week 3:  OT Short Term Goal 1 (Week 3): Pt will be able to pull underwear over hips from [facility] with min A. OT Short Term Goal 2 (Week 3): Pt will be able transfer from [facility] posterior to wc with min A to manage RLE.  Skilled Therapeutic Interventions/Progress Updates:    Pt seen for BADL retraining with a focus on memory and problem solving. Pt received in bed with R foot in external rotation, pillows/ towel roll that had been supporting foot had slid out of way. Discussed using kickstand and starting with 10 min increments to build her tolerance to it. Pt agreed it was necessary. Pt needed min cues with problem solving to adjust bed as necessary to facilitate with self care. Min cues for use of reacher to don/doff clothing over R foot.  Reviewed how well she did with slide board transfers with the PT yesterday, pt needed cues to recall which therapists worked on those transfers and how she did them.  Pt did not need to toilet this am.  During session, pt's daughter called her about lawyer. Pt somewhat upset and needed to call lawyer. Pt able to read phone number, dial phone without cues and conversed with lawyer in a clear manner with clear vocalization.  Pt relaxed after conversation. While pt on phone for 10 min, kickstand adjusted on PRAFO and pt tolerated it well. She agreed to try at least 15 min later when she is back in bed. Pt scooted from supine to sit EOB using leg lifter for RLE with S. Slide board placed under her to review safety with board transfers and pt slid into chair with only one verbal cue to fully move hips over. A with removing board.  Pt sat at sink for oral care with kickstand out on PRAFO so foot in neutral. Pt then propelled self to gym  navigating tight space well. At end of session, pt continuing to tolerate foot in neutral. Hand off to PT.  Therapy Documentation Precautions:  Precautions Precautions: Fall Precaution Comments: combative at times Required Braces or Orthoses: Other Brace/Splint Cervical Brace: At all times Other Brace/Splint: Right PRAFO Restrictions Weight Bearing Restrictions: Yes RLE Weight Bearing: Non weight bearing LLE Weight Bearing: Non weight bearing Other Position/Activity Restrictions: ROM BLE ok EXCEPT R ankle       Pain: Pain Assessment Pain Assessment: No/denies pain  Function:   Eating Eating   Eating Assist Level: Supervision or verbal cues;Set up assist for           Grooming Oral Care,Brush Teeth, Clean Dentures Activity:      Assist Level: More than reasonable amount of time      Wash, Rinse, Dry Face Activity   Assist Level: More than reasonable time      Wash, Rinse, Dry Hands Activity   Assist Level: More than reasonable time      Brush, Comb Hair Activity        Shave Activity          Apply Makeup Activity  Bathing Bathing position   Position: Bed  Bathing parts Body parts bathed by patient: Right arm;Left arm;Chest;Abdomen;Front perineal area;Right upper leg;Left upper leg;Buttocks    Bathing assist     Set up : To obtain items   Upper Body Dressing/Undressing Upper body dressing   What is the patient wearing?: Bra;Pull over shirt/dress Bra - Perfomed by patient: Thread/unthread right bra strap;Thread/unthread left bra strap;Hook/unhook bra (pull down sports bra)   Pull over shirt/dress - Perfomed by patient: Thread/unthread right sleeve;Thread/unthread left sleeve;Put head through opening;Pull shirt over trunk          Upper body assist Assist Level: Set up       Lower Body Dressing/Undressing Lower body dressing   What is the patient wearing?:  Underwear;Pants;Non-skid slipper socks Underwear - Performed by patient: Thread/unthread right underwear leg;Thread/unthread left underwear leg;Pull underwear up/down   Pants- Performed by patient: Thread/unthread right pants leg;Thread/unthread left pants leg Pants- Performed by helper: Pull pants up/down                      Lower body assist         Toileting Toileting          Toileting assist      Bed Mobility Roll left and right activity   Assist level: Supervision or verbal cues Roll left and right assistive device: Bedrails  Sit to lying activity   Assist level: Supervision or verbal cues    Lying to sitting activity   Assist level: No help, No cues, assistive device, takes more than a reasonable amount of time    Mobility details      Transfers Sit to stand transfer        Chair/bed transfer   Chair/bed transfer method: Lateral scoot Chair/bed transfer assist level: Supervision or verbal cues Chair/bed transfer assistive device: Sliding board;Orthosis   Chair/bed transfer details: Verbal cues for technique;Verbal cues for precautions/safety;Verbal cues for sequencing  Toilet transfer                Tub/shower transfer             Cognition Comprehension Comprehension assist level: Understands basic 50 - 74% of the time/ requires cueing 25 - 49% of the time  Expression Expression assist level: Expresses basic 90% of the time/requires cueing < 10% of the time.  Social Interaction Social Interaction assist level: Interacts appropriately 90% of the time - Needs monitoring or encouragement for participation or interaction.  Problem Solving Problem solving assist level: Solves basic 50 - 74% of the time/requires cueing 25 - 49% of the time  Memory Memory assist level: Recognizes or recalls 50 - 74% of the time/requires cueing 25 - 49% of the time    Therapy/Group: Individual Therapy  SAGUIER,JULIA 10/09/2014, 11:57 AM
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
## 2014-10-09 NOTE — Progress Notes (Signed)
Speech Language Pathology Daily Session Note  Patient Details  Name: [name] MRN: [id number] Date of Birth: 1965-11-29  Today's Date: 10/09/2014 SLP Individual Time: 1300-1400 SLP Individual Time Calculation (min): 60 min  Short Term Goals: Week 4: SLP Short Term Goal 1 (Week 4): Patient will consume current diet with minimal overt s/s of aspiration and supervision verbal cues for use of swallowing compensatory strategies.  SLP Short Term Goal 2 (Week 4): Patient will demonstrate efficient mastication of regular textures without overt s/s of aspiration with supervision verbal cues.  SLP Short Term Goal 3 (Week 4): Patient will sustain attention to a functional task for 15 minutes with Min A verbal cues for redirection.  SLP Short Term Goal 4 (Week 4): Patient will utilize external memory aids for recall of functional information with Min A multimodal cues.  SLP Short Term Goal 5 (Week 4): Patient will demonstrate functional problem solving for basic and familiar tasks with Min A multimodal cues.  SLP Short Term Goal 6 (Week 4): Patient will utilize an increased vocal intensity to maximize speech intelligibility at the sentence level to 100% with Mod I.   Skilled Therapeutic Interventions: Skilled treatment session focused on cognitive and dysphagia goals. Upon arrival, patient was asleep while supine in bed but was agreeable to participate in treatment session.  SLP facilitated session by providing extra time and Mod-Max A verbal cues for problem solving, attention and safety with AP transfer from bed to wheelchair.  Patient consumed her lunch meal of Dys. 3 textures with thin liquids without overt s/s of aspiration. Patient utilized swallowing compensatory strategies and maintained arousal for PO intake for 30 minutes with Min A verbal cues. Patient handed off to PT. Continue with current plan of care.    Function:   Cognition Comprehension Comprehension assist level: Understands basic  50 - 74% of the time/ requires cueing 25 - 49% of the time  Expression   Expression assist level: Expresses basic 90% of the time/requires cueing < 10% of the time.  Social Interaction Social Interaction assist level: Interacts appropriately 90% of the time - Needs monitoring or encouragement for participation or interaction.  Problem Solving Problem solving assist level: Solves basic 50 - 74% of the time/requires cueing 25 - 49% of the time  Memory Memory assist level: Recognizes or recalls 50 - 74% of the time/requires cueing 25 - 49% of the time    Pain Pain Assessment Pain Assessment: No/denies pain  Therapy/Group: Individual Therapy  Erland Vivas 10/09/2014, 4:38 PM

## 2014-10-09 NOTE — Progress Notes (Signed)
ANTICOAGULATION CONSULT NOTE - Follow Up Consult  Pharmacy Consult for warfarin Indication: VTE prophylaxis  Allergies  Allergen Reactions  . Codeine Nausea And Vomiting  . Codeine Nausea And Vomiting  . Vicodin [Hydrocodone-Acetaminophen] Nausea And Vomiting  . Vicodin [Hydrocodone-Acetaminophen] Nausea And Vomiting    Patient Measurements: Weight: 170 lb 12.8 oz (77.474 kg)  Vital Signs: Temp: 98.2 F (36.8 C) (08/31 0606) Temp Source: Oral (08/31 0606) BP: 132/74 mmHg (08/31 0606) Pulse Rate: 77 (08/31 0606)  Labs:  Recent Labs  10/07/14 0410 10/08/14 0600 10/09/14 0503  LABPROT 28.7* 30.9* 29.8*  INR 2.76* 3.03* 2.89*    Estimated Creatinine Clearance: 88.5 mL/min (by C-G formula based on Cr of 0.61).  Assessment: 49 y/o female currently therapeutic on warfarin for VTE prophylaxis. INR 2.89. Most recent hgb 11.6, plt 322 K on 8/26. No bleeding noted per chart.   Goal of Therapy:  INR 2-3 Monitor platelets by anticoagulation protocol: Yes   Plan:  Warfarin 15 mg po x 1 today Daily INR Monitor for S&S of bleed  Bayard Hugger, PharmD, BCPS  Clinical Pharmacist  Pager: [phone]   10/09/2014,9:45 AM

## 2014-10-09 NOTE — Progress Notes (Signed)
Pt refused sorbital last night and this morning. LBM 10/03/14

## 2014-10-10 ENCOUNTER — Inpatient Hospital Stay (HOSPITAL_COMMUNITY): Payer: Medicaid Other | Admitting: Speech Pathology

## 2014-10-10 ENCOUNTER — Inpatient Hospital Stay (HOSPITAL_COMMUNITY): Payer: Medicaid Other | Admitting: Physical Therapy

## 2014-10-10 ENCOUNTER — Inpatient Hospital Stay (HOSPITAL_COMMUNITY): Payer: Medicaid Other | Admitting: Occupational Therapy

## 2014-10-10 ENCOUNTER — Inpatient Hospital Stay (HOSPITAL_COMMUNITY): Payer: Self-pay | Admitting: Occupational Therapy

## 2014-10-10 LAB — PROTIME-INR
INR: 2.07 — ABNORMAL HIGH (ref 0.00–1.49)
Prothrombin Time: 23.2 seconds — ABNORMAL HIGH (ref 11.6–15.2)

## 2014-10-10 MED ORDER — WARFARIN SODIUM 7.5 MG PO TABS
17.5000 mg | ORAL_TABLET | Freq: Once | ORAL | Status: AC
  Administered 2014-10-10: 17.5 mg via ORAL
  Filled 2014-10-10: qty 2
  Filled 2014-10-10: qty 1
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
## 2014-10-10 NOTE — Progress Notes (Signed)
Occupational Therapy Session Note  Patient Details  Name: [name] MRN: [id number] Date of Birth: 04-Feb-1966  Today's Date: 10/10/2014 OT Individual Time: 1100-1130 OT Individual Time Calculation (min): 30 min    Short Term Goals: Week 3:  OT Short Term Goal 1 (Week 3): Pt will be able to pull underwear over hips from [facility] with min A. OT Short Term Goal 2 (Week 3): Pt will be able transfer from [facility] posterior to wc with min A to manage RLE.  Skilled Therapeutic Interventions/Progress Updates:    1:1 Pt received in w/c. Focus on UE strengthening and endurance to self propel to the RN station from room with more than reasonable amt of time. Pt taken to gift shop to address functional mobility in a community setting and on carpet with attention to needing extra space for turning radius due to right LE being supported on elevated leg rest. Pt required min A to problem solve mobility in gift shop; through narrow passageways and to obtained items she wanted to purchase. Pt able to calculate simple math in her head with extra time when paying for items with cash. Pt remained alert and demonstrated alternating attention in mildly distracting environment.   Therapy Documentation Precautions:  Precautions Precautions: Fall Precaution Comments: combative at times Required Braces or Orthoses: Other Brace/Splint Cervical Brace: At all times Other Brace/Splint: Right PRAFO Restrictions Weight Bearing Restrictions: Yes RLE Weight Bearing: Non weight bearing LLE Weight Bearing: Non weight bearing Other Position/Activity Restrictions: ROM BLE ok EXCEPT R ankle Pain: No c/o pain in session      Therapy/Group: Individual Therapy  Roney Mans [facility] 10/10/2014, 3:25 PM

## 2014-10-10 NOTE — Progress Notes (Signed)
Physical Therapy Session Note  Patient Details  Name: [name] MRN: [id number] Date of Birth: 12/02/1965  Today's Date: 10/10/2014 PT Individual Time: 1000-1100 and 1305-1400 PT Individual Time Calculation (min): 60 min and 55 min  Short Term Goals: Week 4:  PT Short Term Goal 1 (Week 4): Patient will sustain attention to functional task for 20 min with supervision.  PT Short Term Goal 2 (Week 4): Patient will transfer bed <> wheelchair with consistent supervision and mod multimodal cues for sequencing and technique.  PT Short Term Goal 3 (Week 4): Patient will manage leg rests during transfers with supervision.  PT Short Term Goal 4 (Week 4): Patient will verbalize sequence for AP or slide board transfer with min verbal cues.   Skilled Therapeutic Interventions/Progress Updates:   Session 1: Focus on strengthening, functional transfers, and wheelchair seating and propulsion. Patient in gym, handoff from OT. Performed UBE x 12 min alternating forwards/backwards every min with rest breaks as needed. Patient instructed to set wheelchair up for slide board transfer wheelchair <> mat. Patient required max multimodal cues and more than reasonable amount of time to position wheelchair and max step-by-step cues to complete transfer. Patient able to place and remove slide board this date with supervision and max cues. Patient performed bed mobility with max encouragement and max multimodal cues for sequencing and technique sit <> supine with effective use of leg lifter. Supine on mat, patient performed RLE internal rotation with focus on moving from external rotation > neutral position.. Switched out standard wheelchair for ultra lightweight wheelchair to improve patient's efficiency with wheelchair propulsion. Patient left sitting in wheelchair with all needs within reach.  Session 2: Focus on community/outdoor wheelchair mobility using BUE throughout hospital, on/off elevators, over thresholds,  outdoors on brick and concrete, inclines/declines, and uneven surfaces with overall supervision except one episode of max A to prevent loss of control of wheelchair when outdoors on decline and patient took both hands off wheels and did not attempt to slow wheelchair down or demonstrate awareness that she was completely out of control of chair. Patient required max multimodal cues for sequencing turns/steering and efficient propulsion. Patient instructed in managing left drop-down leg rest with supervision and max multimodal cues. Patient left sitting in wheelchair with all needs within reach.   Therapy Documentation Precautions:  Precautions Precautions: Fall Precaution Comments: combative at times Required Braces or Orthoses: Other Brace/Splint Cervical Brace: At all times Other Brace/Splint: Right PRAFO Restrictions Weight Bearing Restrictions: Yes RLE Weight Bearing: Non weight bearing LLE Weight Bearing: Non weight bearing Other Position/Activity Restrictions: ROM BLE ok EXCEPT R ankle Pain: Pain Assessment Pain Assessment: 0-10 Pain Score: 5  Pain Type: Acute pain Pain Location: Leg Pain Orientation: Right Pain Descriptors / Indicators: Aching Pain Onset: On-going Pain Intervention(s): Emotional support;Repositioned (premedicated)  Function:  Toileting Toileting      Bed Mobility Roll left and right activity        Sit to lying activity   Assist level: Supervision or verbal cues Sit to lying assistive device: Other (comment) (leg lifter)  Lying to sitting activity   Assist level: Supervision or verbal cues Lying to sitting assistive device: Other (comment) (leg lifter)  Mobility details Bed mobility details: Verbal cues for techniques;Verbal cues for safe use of DME/AE;Verbal cues for sequencing;Verbal cues for precautions/safety   Transfers Sit to stand transfer        Chair/bed transfer   Chair/bed transfer method: Lateral scoot Chair/bed transfer assist  level: Supervision or verbal  cues Chair/bed transfer assistive device: Sliding board   Chair/bed transfer details: Verbal cues for technique;Verbal cues for precautions/safety;Verbal cues for sequencing   Toilet transfer           Car transfer          Locomotion Ambulation          Walk 10 feet activity      Walk 50 feet with 2 turns activity      Walk 150 feet activity      Walk 10 feet on uneven surfaces activity      Stairs          Walk up/down 1 step activity        Walk up/down 4 steps activity      Walk up/down 12 steps activity      Pick up small objects from floor      Wheelchair   Type: Manual Max wheelchair distance: 150 ft Assist Level: Supervision or verbal cues  Wheel 50 feet with 2 turns activity   Assist Level: Supervision or verbal cues  Wheel 150 feet activity   Assist Level: Maximal assistance (Pt 25 - 49%) (outdoors on inclines/declines)   Cognition Comprehension Comprehension assist level: Understands basic 50 - 74% of the time/ requires cueing 25 - 49% of the time  Expression Expression assist level: Expresses basic 90% of the time/requires cueing < 10% of the time.  Social Interaction Social Interaction assist level: Interacts appropriately 90% of the time - Needs monitoring or encouragement for participation or interaction.  Problem Solving Problem solving assist level: Solves basic 50 - 74% of the time/requires cueing 25 - 49% of the time  Memory Memory assist level: Recognizes or recalls 50 - 74% of the time/requires cueing 25 - 49% of the time    Therapy/Group: Individual Therapy  Kerney Elbe 10/10/2014, 10:34 AM

## 2014-10-10 NOTE — Progress Notes (Signed)
Speech Language Pathology Daily Session Note  Patient Details  Name: Makaylynn Bonillas MRN: [id number] Date of Birth: [birth year]  Today's Date: 10/10/2014 SLP Individual Time: 1430-1500 SLP Individual Time Calculation (min): 30 min  Short Term Goals: Week 4: SLP Short Term Goal 1 (Week 4): Patient will consume current diet with minimal overt s/s of aspiration and supervision verbal cues for use of swallowing compensatory strategies.  SLP Short Term Goal 2 (Week 4): Patient will demonstrate efficient mastication of regular textures without overt s/s of aspiration with supervision verbal cues.  SLP Short Term Goal 3 (Week 4): Patient will sustain attention to a functional task for 15 minutes with Min A verbal cues for redirection.  SLP Short Term Goal 4 (Week 4): Patient will utilize external memory aids for recall of functional information with Min A multimodal cues.  SLP Short Term Goal 5 (Week 4): Patient will demonstrate functional problem solving for basic and familiar tasks with Min A multimodal cues.  SLP Short Term Goal 6 (Week 4): Patient will utilize an increased vocal intensity to maximize speech intelligibility at the sentence level to 100% with Mod I.   Skilled Therapeutic Interventions: Skilled treatment session focused on dysphagia goals. Patient consumed trials of regular textures and demonstrated efficient mastication without overt s/s of aspiration but required supervision-Min A verbal cues for use of swallowing compensatory strategies. Recommend trial tray of regular textures prior to upgrade. Patient left upright in wheelchair with all needs within reach. Continue with current plan of care.    Function:  Eating Eating   Modified Consistency Diet: No Eating Assist Level: Supervision or verbal cues   Eating Set Up Assist For: Opening containers       Cognition Comprehension Comprehension assist level: Understands basic 75 - 89% of the time/ requires cueing 10 - 24% of the  time  Expression   Expression assist level: Expresses basic 90% of the time/requires cueing < 10% of the time.  Social Interaction Social Interaction assist level: Interacts appropriately 90% of the time - Needs monitoring or encouragement for participation or interaction.  Problem Solving Problem solving assist level: Solves basic 50 - 74% of the time/requires cueing 25 - 49% of the time  Memory Memory assist level: Recognizes or recalls 50 - 74% of the time/requires cueing 25 - 49% of the time    Pain Pain Assessment Pain Assessment: 0-10 Pain Score: 8  Pain Location: Leg Pain Orientation: Right Pain Descriptors / Indicators: Aching Pain Onset: On-going Pain Intervention(s): Medication (See eMAR)  Therapy/Group: Individual Therapy  Deegan Valentino 10/10/2014, 3:36 PM

## 2014-10-10 NOTE — Plan of Care (Signed)
Problem: RH KNOWLEDGE DEFICIT Goal: RH STG INCREASE KNOWLEDGE OF HYPERTENSION Patient/caregiver will verbalize understanding the signs and symptoms of hypo/hypertension including diet, exercise and medication regimen with minimal assistance  Outcome: Not Progressing No family to bedside

## 2014-10-10 NOTE — Progress Notes (Signed)
Occupational Therapy Session Note  Patient Details  Name: [name] MRN: [id number] Date of Birth: 11-11-1965  Today's Date: 10/10/2014 OT Individual Time: 0830-1000 OT Individual Time Calculation (min): 90 min    Short Term Goals: Week 3:  OT Short Term Goal 1 (Week 3): Pt will be able to pull underwear over hips from [facility] with min A. OT Short Term Goal 2 (Week 3): Pt will be able transfer from [facility] posterior to wc with min A to manage RLE.  Skilled Therapeutic Interventions/Progress Updates:    Pt seen this session for ADL retraining of toileting with focus on transfer skills, activity tolerance, problem solving, memory. Pt able to recall yesterdays events clearly.  Pt received in recliner, completed SB to bed with mod A as she had to go up a steep incline as the surfaces could not be made level. Wide drop arm BSC placed adjacent to bed. Pt scooted laterally to [facility] with S without the board as there was minimal space gap between bed and BSC. Toileted and donned clothing from [facility] working on lateral leans. Pt able to don underwear with only 10% A over hips but continues to need more A with pants due to tightness of clothing. Did not need reacher to don over feet.  Pt used slide board to transfer to Wc to complete grooming. Improved problem solving with simple tasks, but increased processing time. To work on reaction and processing speed, pt engage in dynavision exercise.  R hand only reaching for first 3 rings with score of 77 and 1.6 reaction speed.  Pt taken to gym for next therapy session.  Therapy Documentation Precautions:  Precautions Precautions: Fall Precaution Comments: combative at times Required Braces or Orthoses: Other Brace/Splint Cervical Brace: At all times Other Brace/Splint: Right PRAFO Restrictions Weight Bearing Restrictions: Yes RLE Weight Bearing: Non weight bearing LLE Weight Bearing: Non weight bearing Other Position/Activity Restrictions: ROM BLE ok EXCEPT R  ankle  Pain: Pain Assessment Pain Assessment:  (slight headache)  Function:   Eating Eating   Eating Assist Level: Supervision or verbal cues           Grooming Oral Care,Brush Teeth, Clean Dentures Activity:      Assist Level: More than reasonable amount of time      Wash, Rinse, Dry Face Activity   Assist Level: More than reasonable time      Wash, Rinse, Dry Hands Activity   Assist Level: More than reasonable time      Brush, Comb Hair Activity Brush,comb hair activity did not occur: N/A      Shave Activity Shave activity did not occur: N/A        Apply Makeup Activity Apply makeup activity did not occur: Patient does not wear makeup                                                           Bathing Bathing position      Bathing parts      Bathing assist         Upper Body Dressing/Undressing Upper body dressing   What is the patient wearing?: Bra;Pull over shirt/dress Bra - Perfomed by patient: Thread/unthread right bra strap;Thread/unthread left bra strap;Hook/unhook bra (pull down sports bra)   Pull over shirt/dress - Perfomed by patient: Thread/unthread right  sleeve;Thread/unthread left sleeve;Put head through opening;Pull shirt over trunk          Upper body assist Assist Level: Set up   Set up : To obtain clothing/put away   Lower Body Dressing/Undressing Lower body dressing   What is the patient wearing?: Underwear;Pants;Non-skid slipper socks Underwear - Performed by patient: Thread/unthread right underwear leg;Thread/unthread left underwear leg Underwear - Performed by helper: Pull underwear up/down Pants- Performed by patient: Thread/unthread right pants leg;Thread/unthread left pants leg Pants- Performed by helper: Pull pants up/down Non-skid slipper socks- Performed by patient: Don/doff left sock                    Lower body assist         Toileting Toileting   Toileting steps completed by patient: Adjust clothing  prior to toileting;Performs perineal hygiene Toileting steps completed by helper: Adjust clothing after toileting    Toileting assist Assist level: Supervision or verbal cues    Bed Mobility Roll left and right activity        Sit to lying activity        Lying to sitting activity        Mobility details      Transfers Sit to stand transfer        Chair/bed transfer              Toilet transfer   Toilet transfer assistive device: Bedside commode;Sliding board Assist level to bedside commode (at bedside): Supervision or verbal cues (Bed to [facility] without SB) Assist level from bedside commode (at bedside): Touching or steadying assistance (Pt > 75%) (BSC to WC with SB)        Tub/shower transfer             Cognition Comprehension Comprehension assist level: Understands basic 50 - 74% of the time/ requires cueing 25 - 49% of the time  Expression Expression assist level: Expresses basic 90% of the time/requires cueing < 10% of the time.  Social Interaction Social Interaction assist level: Interacts appropriately 90% of the time - Needs monitoring or encouragement for participation or interaction.  Problem Solving Problem solving assist level: Solves basic 75 - 89% of the time/requires cueing 10 - 24% of the time  Memory Memory assist level: Recognizes or recalls 50 - 74% of the time/requires cueing 25 - 49% of the time    Therapy/Group: Individual Therapy  Joletta Manner 10/10/2014, 10:21 AM

## 2014-10-10 NOTE — Progress Notes (Signed)
ANTICOAGULATION CONSULT NOTE - Follow Up Consult  Pharmacy Consult for warfarin Indication: VTE prophylaxis  Allergies  Allergen Reactions  . Codeine Nausea And Vomiting  . Codeine Nausea And Vomiting  . Vicodin [Hydrocodone-Acetaminophen] Nausea And Vomiting  . Vicodin [Hydrocodone-Acetaminophen] Nausea And Vomiting    Patient Measurements: Weight: 173 lb 1 oz (78.5 kg)  Vital Signs: Temp: 98.8 F (37.1 C) (09/01 0531) Temp Source: Oral (09/01 0531) BP: 131/69 mmHg (09/01 0531) Pulse Rate: 75 (09/01 0531)  Labs:  Recent Labs  10/08/14 0600 10/09/14 0503 10/10/14 0704  LABPROT 30.9* 29.8* 23.2*  INR 3.03* 2.89* 2.07*    Estimated Creatinine Clearance: 89.1 mL/min (by C-G formula based on Cr of 0.61).  Assessment: 49 y/o female currently therapeutic on warfarin for VTE prophylaxis. INR 2.89 > 2.07. Most recent hgb 11.6, plt 322 K on 8/26. No bleeding noted per chart.   Goal of Therapy:  INR 2-3 Monitor platelets by anticoagulation protocol: Yes   Plan:  Warfarin 17.5 mg po x 1 today Daily INR Monitor for S&S of bleed  Bayard Hugger, PharmD, BCPS  Clinical Pharmacist  Pager: [phone]   10/10/2014,1:46 PM

## 2014-10-10 NOTE — Progress Notes (Signed)
Social Work Patient ID: [name], female   DOB: [birth year], 49 y.o.   MRN: [id number]   Have reviewed conf with pt and mother. Both aware that I continue to search for SNF bed with primary barrier being Medicaid pending status.  Will keep team posted on any offers.  Denny Mccree, LCSW

## 2014-10-11 ENCOUNTER — Inpatient Hospital Stay (HOSPITAL_COMMUNITY): Payer: Medicaid Other | Admitting: Speech Pathology

## 2014-10-11 ENCOUNTER — Inpatient Hospital Stay (HOSPITAL_COMMUNITY): Payer: Self-pay

## 2014-10-11 ENCOUNTER — Inpatient Hospital Stay (HOSPITAL_COMMUNITY): Payer: Medicaid Other | Admitting: Occupational Therapy

## 2014-10-11 ENCOUNTER — Inpatient Hospital Stay (HOSPITAL_COMMUNITY): Payer: Medicaid Other | Admitting: Physical Therapy

## 2014-10-11 LAB — AFB CULTURE WITH SMEAR (NOT AT ARMC): Acid Fast Smear: NONE SEEN

## 2014-10-11 LAB — PROTIME-INR
INR: 2.19 — ABNORMAL HIGH (ref 0.00–1.49)
Prothrombin Time: 24.2 seconds — ABNORMAL HIGH (ref 11.6–15.2)

## 2014-10-11 MED ORDER — WARFARIN SODIUM 7.5 MG PO TABS
17.5000 mg | ORAL_TABLET | Freq: Every day | ORAL | Status: DC
  Administered 2014-10-11 – 2014-10-17 (×7): 17.5 mg via ORAL
  Filled 2014-10-11 (×2): qty 1
  Filled 2014-10-11: qty 2
  Filled 2014-10-11: qty 1
  Filled 2014-10-11 (×5): qty 2
  Filled 2014-10-11 (×4): qty 1
  Filled 2014-10-11: qty 2

## 2014-10-11 NOTE — Progress Notes (Signed)
Speech Language Pathology Daily Session Note  Patient Details  Name: [name] MRN: [id number] Date of Birth: [birth year]  Today's Date: 10/11/2014 SLP Individual Time: 1000-1100 SLP Individual Time Calculation (min): 60 min  Short Term Goals: Week 4: SLP Short Term Goal 1 (Week 4): Patient will consume current diet with minimal overt s/s of aspiration and supervision verbal cues for use of swallowing compensatory strategies.  SLP Short Term Goal 2 (Week 4): Patient will demonstrate efficient mastication of regular textures without overt s/s of aspiration with supervision verbal cues.  SLP Short Term Goal 3 (Week 4): Patient will sustain attention to a functional task for 15 minutes with Min A verbal cues for redirection.  SLP Short Term Goal 4 (Week 4): Patient will utilize external memory aids for recall of functional information with Min A multimodal cues.  SLP Short Term Goal 5 (Week 4): Patient will demonstrate functional problem solving for basic and familiar tasks with Min A multimodal cues.  SLP Short Term Goal 6 (Week 4): Patient will utilize an increased vocal intensity to maximize speech intelligibility at the sentence level to 100% with Mod I.   Skilled Therapeutic Interventions: Skilled treatment session focused on dysphagia and cognitive goals. Upon arrival, patient was sitting upright in wheelchair with visitor present. Patient propelled herself to the dayroom with extra time and supervision verbal cues for attention to left field of environment. Patient consumed breakfast meal of Dys. 3 textures with thin liquids without overt s/s of aspiration but required Min-Mod A verbal cues for use of small bites, attention to right pocketing and arousal during PO intake. Patient was also re-administered the MoCA and scored 14/22 points with a score of 18 or great considered normal. Patient continues to demonstrate deficits in attention a and recall which are impacted by her overall fatigue.  However, patient's score has improved since initial assessment on 09/17/14 in which she scored 6/22 points. Patient left upright in wheelchair with visitor present. Continue with current plan of care.    Function:  Eating Eating   Modified Consistency Diet: Yes Eating Assist Level: Supervision or verbal cues           Cognition Comprehension Comprehension assist level: Understands basic 75 - 89% of the time/ requires cueing 10 - 24% of the time  Expression   Expression assist level: Expresses basic 90% of the time/requires cueing < 10% of the time.  Social Interaction Social Interaction assist level: Interacts appropriately 75 - 89% of the time - Needs redirection for appropriate language or to initiate interaction.  Problem Solving Problem solving assist level: Solves basic 50 - 74% of the time/requires cueing 25 - 49% of the time  Memory Memory assist level: Recognizes or recalls 50 - 74% of the time/requires cueing 25 - 49% of the time    Pain Pain Assessment Pain Assessment: No/denies pain  Therapy/Group: Individual Therapy  Saidah Kempton 10/11/2014, 4:44 PM
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
## 2014-10-11 NOTE — Progress Notes (Signed)
Physical Therapy Session Note  Patient Details  Name: [name] MRN: [id number] Date of Birth: 1965-11-21  Today's Date: 10/11/2014 PT Individual Time: 1105-1200 PT Individual Time Calculation (min): 55 min   Short Term Goals: Week 4:  PT Short Term Goal 1 (Week 4): Patient will sustain attention to functional task for 20 min with supervision.  PT Short Term Goal 2 (Week 4): Patient will transfer bed <> wheelchair with consistent supervision and mod multimodal cues for sequencing and technique.  PT Short Term Goal 3 (Week 4): Patient will manage leg rests during transfers with supervision.  PT Short Term Goal 4 (Week 4): Patient will verbalize sequence for AP or slide board transfer with min verbal cues.   Skilled Therapeutic Interventions/Progress Updates:    Pt received up in w/c with cousin present for first half of session. Pt very sleepy with low energy levels throughout session and req frequent redirection to open eyes and attend to task at hand. Therapeutic Activity - see below for slideboard transfer and bed mobility details. Focus of mobility is sequencing, problem solving w/c set up, safety - all req max cues and significantly increased time due to pt fatigue and cognitive deficits. W/C Management - see below for details. Pt req mod cues for steering and awareness of obstacles in controlled environment on level surface. Pt left up in w/c, requesting privacy with lawyer who had arrived - nurse tech notified. Continue per PT pOC.   Therapy Documentation Precautions:  Precautions Precautions: Fall Precaution Comments: combative at times Required Braces or Orthoses: Other Brace/Splint Cervical Brace: At all times Other Brace/Splint: Right PRAFO Restrictions Weight Bearing Restrictions: Yes RLE Weight Bearing: Non weight bearing LLE Weight Bearing: Non weight bearing Other Position/Activity Restrictions: ROM BLE ok EXCEPT R ankle Pain: Pain Assessment Pain Assessment:  0-10 Pain Score: 10-Worst pain ever Pain Type: Surgical pain Pain Location: Knee Pain Orientation: Right Pain Descriptors / Indicators: Sore Pain Frequency: Constant Pain Onset: On-going Patients Stated Pain Goal: 4 Pain Intervention(s): Rest;Repositioned Multiple Pain Sites: No   Function:  Toileting Toileting             Bed Mobility Roll left and right activity        Sit to lying activity   Assist level: Touching or steadying assistance (Pt > 75%, lift 1 leg) (for R leg, assisting with leg lifter onto the bed) Sit to lying assistive device: Other (comment) (leg lifter)  Lying to sitting activity   Assist level: Supervision or verbal cues Lying to sitting assistive device: Other (comment) (leg lifter)  Mobility details Bed mobility details: Verbal cues for techniques;Verbal cues for sequencing;Verbal cues for safe use of DME/AE;Manual facilitation for placement   Transfers Sit to stand transfer        Chair/bed transfer   Chair/bed transfer method: Lateral scoot Chair/bed transfer assist level: Touching or steadying assistance (Pt > 75%) (min A for scoot "uphill" w/c to bed; SBA scoot "downhill" bed to w/c) Chair/bed transfer assistive device: Sliding board;Armrests;Orthosis (leg lifter)   Chair/bed transfer details: Verbal cues for safe use of DME/AE;Verbal cues for technique;Verbal cues for sequencing;Manual facilitation for placement   Toilet transfer                Car transfer          Locomotion Ambulation          Walk 10 feet activity      Walk 50 feet with 2 turns activity  Walk 150 feet activity      Walk 10 feet on uneven surfaces activity      Stairs          Walk up/down 1 step activity        Walk up/down 4 steps activity      Walk up/down 12 steps activity      Pick up small objects from floor      Wheelchair   Type: Manual Max wheelchair distance: 150 Assist Level: Supervision or verbal cues  Wheel 50 feet with  2 turns activity   Assist Level: Supervision or verbal cues  Wheel 150 feet activity   Assist Level: Supervision or verbal cues   Cognition Comprehension Comprehension assist level: Understands basic 75 - 89% of the time/ requires cueing 10 - 24% of the time  Expression Expression assist level: Expresses basic 90% of the time/requires cueing < 10% of the time.  Social Interaction Social Interaction assist level: Interacts appropriately 90% of the time - Needs monitoring or encouragement for participation or interaction.  Problem Solving Problem solving assist level: Solves basic 50 - 74% of the time/requires cueing 25 - 49% of the time  Memory Memory assist level: Recognizes or recalls 50 - 74% of the time/requires cueing 25 - 49% of the time    Therapy/Group: Individual Therapy  Makel Mcmann M 10/11/2014, 12:11 PM

## 2014-10-11 NOTE — Progress Notes (Signed)
Occupational Therapy Note  Patient Details  Name: [name] MRN: [id number] Date of Birth: 08/09/1965  Today's Date: 10/11/2014 OT Individual Time: 1330-1430 OT Individual Time Calculation (min): 60 min   Pt denied pain; pt stated she had received pain meds earlier Individual Therapy  Pt engaged in table top activity requiring patient to match cards using 1 of 3 criteria - shape, color, or number of shapes.  Pt required max verbal/questioning cues to attend to task and initiate making a play/move.  Pt required max verbal cues to keep eyes open.  Pt would fall asleep mid-action and required tactile cue to awaken.  At one time, pt stated, "I'll tell you what.  You paint my fingernails and I will keep my eyes open." Focus on task initiation, following rules of game, sequencing, problem solving, and attention to task to increase ability to complete BADLs and functional tasks with increased independence.   Lavone Neri [facility] 10/11/2014, 2:32 PM

## 2014-10-11 NOTE — Progress Notes (Signed)
ANTICOAGULATION CONSULT NOTE - Follow Up Consult  Pharmacy Consult for warfarin Indication: VTE prophylaxis  Allergies  Allergen Reactions  . Codeine Nausea And Vomiting  . Codeine Nausea And Vomiting  . Vicodin [Hydrocodone-Acetaminophen] Nausea And Vomiting  . Vicodin [Hydrocodone-Acetaminophen] Nausea And Vomiting    Patient Measurements: Weight: 173 lb 1 oz (78.5 kg)  Vital Signs: Temp: 98.2 F (36.8 C) (09/02 0529) Temp Source: Oral (09/02 0529) BP: 120/70 mmHg (09/02 0945) Pulse Rate: 80 (09/02 0945)  Labs:  Recent Labs  10/09/14 0503 10/10/14 0704 10/11/14 0551  LABPROT 29.8* 23.2* 24.2*  INR 2.89* 2.07* 2.19*    Estimated Creatinine Clearance: 89.1 mL/min (by C-G formula based on Cr of 0.61).  Assessment: 49 y/o female currently therapeutic on warfarin for VTE prophylaxis. INR 2.19. Most recent hgb 11.6, plt 322 K on 8/26. No bleeding noted per chart.   Goal of Therapy:  INR 2-3 Monitor platelets by anticoagulation protocol: Yes   Plan:  Warfarin 17.5 mg po daily Change INR to MWF, since it is fairly stable and therapeutic on 17.5mg  daily Monitor for S&S of bleed  Bayard Hugger, PharmD, BCPS  Clinical Pharmacist  Pager: [phone]   10/11/2014,12:10 PM

## 2014-10-11 NOTE — Progress Notes (Signed)
Occupational Therapy Weekly Progress Note  Patient Details  Name: [name] MRN: [id number] Date of Birth: 01/28/1966  Beginning of progress report period: October 04, 2014 End of progress report period: October 11, 2014  Today's Date: 10/11/2014 OT Individual Time: 0830-0953 OT Individual Time Calculation (min): 83 min    Patient has met 2 of 2 short term goals.  Pt has made excellent progress with her functional mobility to progress from A/P transfers to sliding board transfers with set up as she can now tolerate having her legs in a dependent position. She is currently at a [place] level, she is oriented but continues to have mild confusion about events and limited short term memory. She is improving with her attention, alertness, problem solving, memory of staff that have worked with her, and general activity tolerance.  Patient continues to demonstrate the following deficits: limited L shoulder and wrist strength, decreased cognition, non weight bearing on BLE and therefore will continue to benefit from skilled OT intervention to enhance overall performance with BADL.  Patient progressing toward long term goals..  Continue plan of care.  OT Short Term Goals Week 1:  OT Short Term Goal 1 (Week 1): Pt will don UB clothing with setup OT Short Term Goal 1 - Progress (Week 1): Met OT Short Term Goal 2 (Week 1): Pt will thread LB clothing with AE with mod A OT Short Term Goal 2 - Progress (Week 1): Met OT Short Term Goal 3 (Week 1): Pt will transfer to BSC A/P with max A with 1 person OT Short Term Goal 3 - Progress (Week 1): Progressing toward goal OT Short Term Goal 4 (Week 1): Pt will perform 3/3 grooming at sink with setup OT Short Term Goal 4 - Progress (Week 1): Met Week 2:  OT Short Term Goal 1 (Week 2): Pt will don LB clothing with set up from bed level. OT Short Term Goal 1 - Progress (Week 2): Progressing toward goal (Pt can do underwear and sock, needs A with pants as they  do not stretch) OT Short Term Goal 2 (Week 2): Pt will bathe self with set up from bed level. OT Short Term Goal 2 - Progress (Week 2): Met OT Short Term Goal 3 (Week 2): Pt will transfer on/off BSC with mod A x1. OT Short Term Goal 3 - Progress (Week 2): Met OT Short Term Goal 4 (Week 2): Pt will be able to toilet with mod A for clothing management and set up for hygiene. OT Short Term Goal 4 - Progress (Week 2): Met Week 3:  OT Short Term Goal 1 (Week 3): Pt will be able to pull underwear over hips from BSC with min A. OT Short Term Goal 1 - Progress (Week 3): Met OT Short Term Goal 2 (Week 3): Pt will be able transfer from BSC posterior to wc with min A to manage RLE. OT Short Term Goal 2 - Progress (Week 3): Met Week 4:  OT Short Term Goal 1 (Week 4): Pt will transfer bed >< BSC with S only. OT Short Term Goal 2 (Week 4): Pt will manage underwear over hips on BSC with Supervision. OT Short Term Goal 3 (Week 4): Pt will manage pants over hips on bed with Supervision. OT Short Term Goal 4 (Week 4): Pt will wash L foot with long handled sponge in shower using roll in shower chair.  Skilled Therapeutic Interventions/Progress Updates:    Pt seen this session to facilitate ADL   skills with use of roll in shower chair in shower and dressing from EOB. Pt sleeping at start of session but was able to be awakened fairly easily, pt eager to get in shower. Sat to EOB and used SB to roll in shower chair.  Pt was rolled into shower and she completed bathing self except for L foot as it was too difficult to reach. Pt will benefit from long handled sponge.   Pt transferred back to bed post shower to dress EOB. Improved lateral weight shifting for clothing management.  Pt transferred to w/c with SB to complete grooming at sink.  Pt tolerated all activity well and was not irritable this session. Pt resting in w/c with nurse in room with her.  Therapy Documentation Precautions:  Precautions Precautions:  Fall Precaution Comments: combative at times Required Braces or Orthoses: Other Brace/Splint Cervical Brace: At all times Other Brace/Splint: Right PRAFO Restrictions Weight Bearing Restrictions: Yes RLE Weight Bearing: Non weight bearing LLE Weight Bearing: Non weight bearing Other Position/Activity Restrictions: ROM BLE ok EXCEPT R ankle  Vital Signs: Therapy Vitals Pulse Rate: 80 BP: 120/70 mmHg Pain: No c/o pain during OT session.  Function:   Eating Eating               Grooming Oral Care,Brush Teeth, Clean Dentures Activity:      Assist Level: More than reasonable amount of time      Wash, Rinse, Dry Face Activity   Assist Level: More than reasonable time      Wash, Rinse, Dry Hands Activity   Assist Level: More than reasonable time      Brush, Comb Hair Activity Brush,comb hair activity did not occur: N/A      Shave Activity Shave activity did not occur: N/A        Apply Makeup Activity Apply makeup activity did not occur: Patient does not wear makeup                                                           Bathing Bathing position   Position: Shower (roll in shower chair)  Bathing parts Body parts bathed by patient: Right arm;Left arm;Chest;Abdomen;Front perineal area;Right upper leg;Left upper leg;Buttocks Body parts bathed by helper: Back;Left lower leg  Bathing assist         Upper Body Dressing/Undressing Upper body dressing   What is the patient wearing?: Bra;Pull over shirt/dress Bra - Perfomed by patient: Thread/unthread right bra strap;Thread/unthread left bra strap;Hook/unhook bra (pull down sports bra)   Pull over shirt/dress - Perfomed by patient: Thread/unthread right sleeve;Thread/unthread left sleeve;Put head through opening;Pull shirt over trunk          Upper body assist Assist Level: Set up       Lower Body Dressing/Undressing Lower body dressing   What is the patient wearing?: Underwear;Pants;Non-skid slipper  socks Underwear - Performed by patient: Thread/unthread right underwear leg;Thread/unthread left underwear leg;Pull underwear up/down   Pants- Performed by patient: Thread/unthread right pants leg;Thread/unthread left pants leg Pants- Performed by helper: Pull pants up/down   Non-skid slipper socks- Performed by helper: Don/doff left sock                  Lower body assist         Toileting Toileting            Toileting assist      Bed Mobility Roll left and right activity        Sit to lying activity   Assist level: Touching or steadying assistance (Pt > 75%, lift 1 leg) (for R leg, assisting with leg lifter onto the bed) Sit to lying assistive device: Other (comment) (leg lifter)  Lying to sitting activity   Assist level: Supervision or verbal cues Lying to sitting assistive device: Other (comment) (leg lifter)  Mobility details Bed mobility details: Verbal cues for techniques;Verbal cues for sequencing;Verbal cues for safe use of DME/AE;Manual facilitation for placement    Transfers Sit to stand transfer        Chair/bed transfer   Chair/bed transfer method: Lateral scoot Chair/bed transfer assist level: Set up only Chair/bed transfer assistive device: Sliding board   Chair/bed transfer details: Verbal cues for safe use of DME/AE;Verbal cues for technique;Verbal cues for sequencing;Manual facilitation for placement  Toilet transfer                Tub/shower transfer             Cognition Comprehension Comprehension assist level: Understands basic 75 - 89% of the time/ requires cueing 10 - 24% of the time  Expression Expression assist level: Expresses basic 90% of the time/requires cueing < 10% of the time.  Social Interaction Social Interaction assist level: Interacts appropriately 90% of the time - Needs monitoring or encouragement for participation or interaction.  Problem Solving Problem solving assist level: Solves basic 50 - 74% of the  time/requires cueing 25 - 49% of the time  Memory Memory assist level: Recognizes or recalls 50 - 74% of the time/requires cueing 25 - 49% of the time    Therapy/Group: Individual Therapy  Keeva Reisen 10/11/2014, 12:29 PM

## 2014-10-12 ENCOUNTER — Inpatient Hospital Stay (HOSPITAL_COMMUNITY): Payer: Medicaid Other | Admitting: Occupational Therapy

## 2014-10-12 NOTE — Progress Notes (Signed)
Occupational Therapy Session Note  Patient Details  Name: [name] MRN: [id number] Date of Birth: November 22, 1965  Today's Date: 10/12/2014 OT Individual Time: 1300-1345 OT Individual Time Calculation (min): 45 min    Short Term Goals: Week 1:  OT Short Term Goal 1 (Week 1): Pt will don UB clothing with setup OT Short Term Goal 1 - Progress (Week 1): Met OT Short Term Goal 2 (Week 1): Pt will thread LB clothing with AE with mod A OT Short Term Goal 2 - Progress (Week 1): Met OT Short Term Goal 3 (Week 1): Pt will transfer to [facility] A/P with max A with 1 person OT Short Term Goal 3 - Progress (Week 1): Progressing toward goal OT Short Term Goal 4 (Week 1): Pt will perform 3/3 grooming at sink with setup OT Short Term Goal 4 - Progress (Week 1): Met Week 2:  OT Short Term Goal 1 (Week 2): Pt will don LB clothing with set up from bed level. OT Short Term Goal 1 - Progress (Week 2): Progressing toward goal (Pt can do underwear and sock, needs A with pants as they do not stretch) OT Short Term Goal 2 (Week 2): Pt will bathe self with set up from bed level. OT Short Term Goal 2 - Progress (Week 2): Met OT Short Term Goal 3 (Week 2): Pt will transfer on/off BSC with mod A x1. OT Short Term Goal 3 - Progress (Week 2): Met OT Short Term Goal 4 (Week 2): Pt will be able to toilet with mod A for clothing management and set up for hygiene. OT Short Term Goal 4 - Progress (Week 2): Met Week 3:  OT Short Term Goal 1 (Week 3): Pt will be able to pull underwear over hips from [facility] with min A. OT Short Term Goal 1 - Progress (Week 3): Met OT Short Term Goal 2 (Week 3): Pt will be able transfer from [facility] posterior to wc with min A to manage RLE. OT Short Term Goal 2 - Progress (Week 3): Met  Skilled Therapeutic Interventions/Progress Updates:    Engaged in bed mobility dressing in long sitting and rolling transfers.  Pt performed dressing at supervision level.  Transferred to wc with SB with min to SBA.  Son,  Vonna Kotyk assist with wc set up and donning foot rests.  Left pt in room with all needs in reach.    Therapy Documentation Precautions:  Precautions Precautions: Fall Precaution Comments: combative at times Required Braces or Orthoses: Other Brace/Splint Cervical Brace: At all times Other Brace/Splint: Right PRAFO Restrictions Weight Bearing Restrictions: Yes RLE Weight Bearing: Non weight bearing LLE Weight Bearing: Non weight bearing Other Position/Activity Restrictions: ROM BLE ok EXCEPT R ankle    Vital Signs:  Pain: Pain Assessment Pain Score:4/10  Right leg ADL:   Exercises:   Other Treatments:    Function:   Eating Eating   Eating Assist Level: Set up assist for;Supervision or verbal cues   Eating Set Up Assist For: Opening containers       Grooming Oral Care,Brush Teeth, Clean Dentures Activity:             Wash, Rinse, Dry Face Activity          Wash, Rinse, Dry Hands Activity          Brush, Comb Hair Activity        Shave Activity          Apply Makeup Activity  Bathing Bathing position      Bathing parts      Bathing assist         Upper Body Dressing/Undressing Upper body dressing   What is the patient wearing?: Bra;Pull over shirt/dress Bra - Perfomed by patient: Thread/unthread right bra strap;Thread/unthread left bra strap;Hook/unhook bra (pull down sports bra)   Pull over shirt/dress - Perfomed by patient: Thread/unthread right sleeve;Thread/unthread left sleeve;Put head through opening;Pull shirt over trunk          Upper body assist Assist Level: Set up   Set up : To obtain clothing/put away   Lower Body Dressing/Undressing Lower body dressing   What is the patient wearing?: Underwear;Pants;Non-skid slipper socks Underwear - Performed by patient: Thread/unthread right underwear leg;Thread/unthread left underwear leg;Pull underwear up/down   Pants-  Performed by patient: Thread/unthread right pants leg;Thread/unthread left pants leg Pants- Performed by helper: Pull pants up/down   Non-skid slipper socks- Performed by helper: Don/doff left sock                  Lower body assist Assist Level: Touching or steadying assistance (Pt > 75%)       Toileting Toileting          Toileting assist      Bed Mobility Roll left and right activity   Assist level: Supervision or verbal cues Roll left and right assistive device: Bedrails  Sit to lying activity   Assist level: Touching or steadying assistance (Pt > 75%, lift 1 leg)    Lying to sitting activity   Assist level: Supervision or verbal cues Lying to sitting assistive device: Bedrails  Mobility details Bed mobility details: Verbal cues for precautions/safety    Transfers Sit to stand transfer        Chair/bed transfer   Chair/bed transfer method: Lateral scoot Chair/bed transfer assist level: Set up only Chair/bed transfer assistive device: Sliding board   Chair/bed transfer details: Verbal cues for safe use of DME/AE;Verbal cues for technique;Verbal cues for sequencing;Manual facilitation for placement         Cognition Comprehension Comprehension assist level: Understands basic 75 - 89% of the time/ requires cueing 10 - 24% of the time  Expression Expression assist level: Expresses basic 90% of the time/requires cueing < 10% of the time.  Social Interaction Social Interaction assist level: Interacts appropriately 75 - 89% of the time - Needs redirection for appropriate language or to initiate interaction.  Problem Solving Problem solving assist level: Solves basic 50 - 74% of the time/requires cueing 25 - 49% of the time  Memory Memory assist level: Recognizes or recalls 75 - 89% of the time/requires cueing 10 - 24% of the time    Therapy/Group: Individual Therapy  Lisa Roca 10/12/2014, 7:33 PM
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
## 2014-10-13 ENCOUNTER — Inpatient Hospital Stay (HOSPITAL_COMMUNITY): Payer: Medicaid Other | Admitting: Occupational Therapy
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
## 2014-10-13 NOTE — Progress Notes (Signed)
Occupational Therapy Session Note  Patient Details  Name: [name] MRN: [id number] Date of Birth: [birth year]  Today's Date: 10/13/2014 OT Individual Time:  -   0945-1030  (45 min(      Short Term Goals: Week 1:  OT Short Term Goal 1 (Week 1): Pt will don UB clothing with setup OT Short Term Goal 1 - Progress (Week 1): Met OT Short Term Goal 2 (Week 1): Pt will thread LB clothing with AE with mod A OT Short Term Goal 2 - Progress (Week 1): Met OT Short Term Goal 3 (Week 1): Pt will transfer to [facility] A/P with max A with 1 person OT Short Term Goal 3 - Progress (Week 1): Progressing toward goal OT Short Term Goal 4 (Week 1): Pt will perform 3/3 grooming at sink with setup OT Short Term Goal 4 - Progress (Week 1): Met Week 2:  OT Short Term Goal 1 (Week 2): Pt will don LB clothing with set up from bed level. OT Short Term Goal 1 - Progress (Week 2): Progressing toward goal (Pt can do underwear and sock, needs A with pants as they do not stretch) OT Short Term Goal 2 (Week 2): Pt will bathe self with set up from bed level. OT Short Term Goal 2 - Progress (Week 2): Met OT Short Term Goal 3 (Week 2): Pt will transfer on/off BSC with mod A x1. OT Short Term Goal 3 - Progress (Week 2): Met OT Short Term Goal 4 (Week 2): Pt will be able to toilet with mod A for clothing management and set up for hygiene. OT Short Term Goal 4 - Progress (Week 2): Met Week 3:  OT Short Term Goal 1 (Week 3): Pt will be able to pull underwear over hips from [facility] with min A. OT Short Term Goal 1 - Progress (Week 3): Met OT Short Term Goal 2 (Week 3): Pt will be able transfer from [facility] posterior to wc with min A to manage RLE. OT Short Term Goal 2 - Progress (Week 3): Met Week 4:  OT Short Term Goal 1 (Week 4): Pt will transfer bed >< BSC with S only. OT Short Term Goal 2 (Week 4): Pt will manage underwear over hips on [facility] with Supervision. OT Short Term Goal 3 (Week 4): Pt will manage pants over hips on bed with  Supervision. OT Short Term Goal 4 (Week 4): Pt will wash L foot with long handled sponge in shower using roll in shower chair. Week 5:     Skilled Therapeutic Interventions/Progress Updates:    Pt. Initially wanted to get dressed but realized she needed to bathe.  Bathed in bed and on BSC.  Pt continent of urine.  SB transfer to [facility] Hights >wc with min assist for steadying chairs and BSC.  Pt. Rolled to sink and completed grooming with NT present.      Therapy Documentation Precautions:  Precautions Precautions: Fall Precaution Comments: combative at times Required Braces or Orthoses: Other Brace/Splint Cervical Brace: At all times Other Brace/Splint: Right PRAFO Restrictions Weight Bearing Restrictions: Yes RLE Weight Bearing: Non weight bearing LLE Weight Bearing: Non weight bearing Other Position/Activity Restrictions: ROM BLE ok EXCEPT R ankle General:      Pain: Pain Assessment Pain Assessment: 0-10 Pain Score: 2  Pain Type: Surgical pain Pain Location: Leg Pain Orientation: Right Patients Stated Pain Goal: 2       Other Treatments:    Function:   Eating Eating  Grooming Oral Care,Brush Teeth, Clean Dentures Activity:             Wash, Rinse, Dry Face Activity          Wash, Rinse, Dry Hands Activity          Brush, Comb Hair Activity        Shave Activity          Apply Makeup Activity                                                             Bathing Bathing position   Position: Bed  Bathing parts Body parts bathed by patient: Right arm;Left arm;Chest;Abdomen;Front perineal area;Right upper leg;Left upper leg;Buttocks Body parts bathed by helper: Back;Left lower leg  Bathing assist Assist Level: Set up   Set up : To obtain items   Upper Body Dressing/Undressing Upper body dressing   What is the patient wearing?: Pull over shirt/dress     Pull over shirt/dress - Perfomed by patient: Thread/unthread right  sleeve;Thread/unthread left sleeve;Put head through opening;Pull shirt over trunk          Upper body assist Assist Level: Set up   Set up : To obtain clothing/put away   Lower Body Dressing/Undressing Lower body dressing   What is the patient wearing?: Underwear;AFO;Non-skid slipper socks Underwear - Performed by patient: Thread/unthread right underwear leg;Thread/unthread left underwear leg Underwear - Performed by helper: Pull underwear up/down     Non-skid slipper socks- Performed by patient: Don/doff left sock Non-skid slipper socks- Performed by helper: Don/doff right sock (donned Cam boot)                  Lower body assist Assist Level: Touching or steadying assistance (Pt > 75%)       Toileting Toileting          Toileting assist      Bed Mobility Roll left and right activity   Assist level: Supervision or verbal cues Roll left and right assistive device: Bedrails  Sit to lying activity        Lying to sitting activity   Assist level: Supervision or verbal cues Lying to sitting assistive device: Bedrails  Mobility details Bed mobility details: Verbal cues for precautions/safety    Transfers Sit to stand transfer        Chair/bed transfer              Toilet transfer   Toilet transfer assistive device: Bedside commode Assist level to bedside commode (at bedside): Supervision or verbal cues Assist level from bedside commode (at bedside): Touching or steadying assistance (Pt > 75%)        Tub/shower transfer             Cognition Comprehension Comprehension assist level: Understands basic 75 - 89% of the time/ requires cueing 10 - 24% of the time  Expression Expression assist level: Expresses basic 90% of the time/requires cueing < 10% of the time.  Social Interaction Social Interaction assist level: Interacts appropriately 75 - 89% of the time - Needs redirection for appropriate language or to initiate interaction.  Problem Solving  Problem solving assist level: Solves basic 50 - 74% of the time/requires cueing 25 - 49% of the time  Memory Memory assist level:  Recognizes or recalls 75 - 89% of the time/requires cueing 10 - 24% of the time    Therapy/Group: Individual Therapy  Lisa Roca 10/13/2014, 10:39 AM

## 2014-10-14 ENCOUNTER — Inpatient Hospital Stay (HOSPITAL_COMMUNITY): Payer: Medicaid Other | Admitting: Speech Pathology

## 2014-10-14 ENCOUNTER — Inpatient Hospital Stay (HOSPITAL_COMMUNITY): Payer: Medicaid Other | Admitting: Physical Therapy

## 2014-10-14 ENCOUNTER — Inpatient Hospital Stay (HOSPITAL_COMMUNITY): Payer: Medicaid Other | Admitting: Occupational Therapy

## 2014-10-14 ENCOUNTER — Inpatient Hospital Stay (HOSPITAL_COMMUNITY): Payer: Medicaid Other

## 2014-10-14 LAB — PROTIME-INR
INR: 2.03 — ABNORMAL HIGH (ref 0.00–1.49)
Prothrombin Time: 22.8 seconds — ABNORMAL HIGH (ref 11.6–15.2)

## 2014-10-14 NOTE — Progress Notes (Signed)
Speech Language Pathology Daily Session Note  Patient Details  Name: [name] MRN: [id number] Date of Birth: 02/24/1965  Today's Date: 10/14/2014 SLP Individual Time: 1100-1130 SLP Individual Time Calculation (min): 30 min  Short Term Goals: Week 4: SLP Short Term Goal 1 (Week 4): Patient will consume current diet with minimal overt s/s of aspiration and supervision verbal cues for use of swallowing compensatory strategies.  SLP Short Term Goal 2 (Week 4): Patient will demonstrate efficient mastication of regular textures without overt s/s of aspiration with supervision verbal cues.  SLP Short Term Goal 3 (Week 4): Patient will sustain attention to a functional task for 15 minutes with Min A verbal cues for redirection.  SLP Short Term Goal 4 (Week 4): Patient will utilize external memory aids for recall of functional information with Min A multimodal cues.  SLP Short Term Goal 5 (Week 4): Patient will demonstrate functional problem solving for basic and familiar tasks with Min A multimodal cues.  SLP Short Term Goal 6 (Week 4): Patient will utilize an increased vocal intensity to maximize speech intelligibility at the sentence level to 100% with Mod I.   Skilled Therapeutic Interventions: Skilled treatment session focused on cognitive goals. SLP facilitated session by providing Mod-Max A verbal and tactile cues for patient to maintain alertness for ~20 seconds at a time and required an extended amount of time and Mod A verbal cues to propel her wheelchair to her room. Patient participated in a basic verbal reasoning task and required Max A verbal cues for problem solving. Patient also required Mod A verbal cues for use of an increased vocal intensity at the phrase level to increased intelligibility to ~90%.  Patient handed off to NT. Continue with current plan of care.    Function:  Cognition Comprehension Comprehension assist level: Understands basic 75 - 89% of the time/ requires cueing  10 - 24% of the time  Expression   Expression assist level: Expresses basic 90% of the time/requires cueing < 10% of the time.  Social Interaction Social Interaction assist level: Interacts appropriately 90% of the time - Needs monitoring or encouragement for participation or interaction.  Problem Solving Problem solving assist level: Solves basic 50 - 74% of the time/requires cueing 25 - 49% of the time  Memory Memory assist level: Recognizes or recalls 25 - 49% of the time/requires cueing 50 - 75% of the time    Pain Pain Assessment Pain Assessment: No/denies pain  Therapy/Group: Individual Therapy  Clorinda Wyble 10/14/2014, 3:48 PM
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
## 2014-10-14 NOTE — Progress Notes (Signed)
Occupational Therapy Session Note  Patient Details  Name: [name] MRN: [id number] Date of Birth: 04-02-1965  Today's Date: 10/14/2014 OT Individual Time: 0833-1000  OT Individual Time Calculation (min): 87 min    Short Term Goals: Week 4:  OT Short Term Goal 1 (Week 4): Pt will transfer bed >< BSC with S only. OT Short Term Goal 2 (Week 4): Pt will manage underwear over hips on [facility] with Supervision. OT Short Term Goal 3 (Week 4): Pt will manage pants over hips on bed with Supervision. OT Short Term Goal 4 (Week 4): Pt will wash L foot with long handled sponge in shower using roll in shower chair.  Skilled Therapeutic Interventions/Progress Updates:      Pt seen for BADL retraining of  bathing, and dressing with a focus on attention, problem solving, processing speed, UE strength with transfers, and activity tolerance. Pt did not want to use BSC today and requested bed pan despite several attempts from this therapist to have her work on [facility] transfers. Pt completed self care with min A at EOB and using rolling to don pants over hips.  The pins in her foot ripped her bed sheet when AFO was removed and started to catch on her pants. She may need to keep AFO during dressing to prevent this problem.  Transferred to w/c with min cues to complete grooming at sink.  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 She became lethargic as soon as she entered the day room.  Pt did not have reading glasses and had great difficulty seeing screen.  Max cues for identifying user name and password to register for a new email and to register her Kindle. Pt attempted typing in her name, but was entering letters in an impulsive manner and needed total cues to slow down and push one letter at a time.  Downloaded a book for pt and font size was made very large. Pt able to read large font.  Pt taken back to her room. NT applied quick release belt.    Therapy Documentation Precautions:   Precautions Precautions: Fall Precaution Comments: combative at times Required Braces or Orthoses: Other Brace/Splint Cervical Brace: At all times Other Brace/Splint: Right PRAFO Restrictions Weight Bearing Restrictions: Yes RLE Weight Bearing: Non weight bearing LLE Weight Bearing: Non weight bearing Other Position/Activity Restrictions: ROM BLE ok EXCEPT R ankle    Vital Signs: Therapy Vitals Temp: 98.4 F (36.9 C) Temp Source: Oral Pulse Rate: 68 Resp: 17 BP: (!) 158/79 mmHg Patient Position (if appropriate): Lying Oxygen Therapy SpO2: 99 % O2 Device: Not Delivered Pain: Pain Assessment Pain Assessment: No/denies pain  Function:   Eating Eating               Grooming Oral Care,Brush Teeth, Clean Dentures Activity:      Assist Level: More than reasonable amount of time      Wash, Rinse, Dry Face Activity   Assist Level: More than reasonable time      Wash, Rinse, Dry Hands Activity   Assist Level: More than reasonable time      Brush, Comb Hair Activity Brush,comb hair activity did not occur: N/A      Shave Activity   Assist Level: Helper performed activity      Apply Makeup Activity Apply makeup activity did not occur: Patient does not wear makeup  Bathing Bathing position   Position: Bed  Bathing parts Body parts bathed by patient: Right arm;Left arm;Chest;Abdomen;Front perineal area;Right upper leg;Left upper leg;Buttocks    Bathing assist         Upper Body Dressing/Undressing Upper body dressing   What is the patient wearing?: Pull over shirt/dress;Bra Bra - Perfomed by patient: Thread/unthread right bra strap;Thread/unthread left bra strap;Hook/unhook bra (pull down sports bra)   Pull over shirt/dress - Perfomed by patient: Thread/unthread right sleeve;Thread/unthread left sleeve;Put head through opening;Pull shirt over trunk          Upper body assist     Set up :  To obtain clothing/put away   Lower Body Dressing/Undressing Lower body dressing   What is the patient wearing?: Underwear;AFO;Non-skid slipper socks;Pants Underwear - Performed by patient: Thread/unthread right underwear leg;Thread/unthread left underwear leg;Pull underwear up/down   Pants- Performed by patient: Thread/unthread left pants leg;Pull pants up/down Pants- Performed by helper: Thread/unthread right pants leg (to prevent pins from catching on clothing) Non-skid slipper socks- Performed by patient: Don/doff left sock           AFO - Performed by patient: Don/doff right AFO        Lower body assist Assist Level: Supervision or verbal cues       Toileting Toileting          Toileting assist      Bed Mobility Roll left and right activity   Assist level: Supervision or verbal cues Roll left and right assistive device: Bedrails  Sit to lying activity   Assist level: Supervision or verbal cues Sit to lying assistive device: Bedrails  Lying to sitting activity   Assist level: Supervision or verbal cues Lying to sitting assistive device: Bedrails  Mobility details Bed mobility details: Verbal cues for precautions/safety    Transfers Sit to stand transfer        Chair/bed transfer   Chair/bed transfer method: Lateral scoot Chair/bed transfer assist level: Set up only Chair/bed transfer assistive device: Sliding board   Chair/bed transfer details: Verbal cues for safe use of DME/AE  Toilet transfer                Tub/shower transfer             Cognition Comprehension Comprehension assist level: Understands basic 75 - 89% of the time/ requires cueing 10 - 24% of the time  Expression Expression assist level: Expresses basic 90% of the time/requires cueing < 10% of the time.  Social Interaction Social Interaction assist level: Interacts appropriately 90% of the time - Needs monitoring or encouragement for participation or interaction.  Problem  Solving Problem solving assist level: Solves basic 50 - 74% of the time/requires cueing 25 - 49% of the time  Memory Memory assist level: Recognizes or recalls 25 - 49% of the time/requires cueing 50 - 75% of the time    Therapy/Group: Individual Therapy  SAGUIER,JULIA 10/14/2014, 9:33 AM

## 2014-10-14 NOTE — Progress Notes (Signed)
Physical Therapy Session Note  Patient Details  Name: [name] MRN: [id number] Date of Birth: November 13, 1965  Today's Date: 10/14/2014 PT Individual Time: 1030-1100 and 1410-1505 PT Individual Time Calculation (min): 30 min and 55 min  Short Term Goals: Week 4:  PT Short Term Goal 1 (Week 4): Patient will sustain attention to functional task for 20 min with supervision.  PT Short Term Goal 2 (Week 4): Patient will transfer bed <> wheelchair with consistent supervision and mod multimodal cues for sequencing and technique.  PT Short Term Goal 3 (Week 4): Patient will manage leg rests during transfers with supervision.  PT Short Term Goal 4 (Week 4): Patient will verbalize sequence for AP or slide board transfer with min verbal cues.   Skilled Therapeutic Interventions/Progress Updates:   AM session: Pt received in w/c still eating breakfast with son present.  Pt extremely lethargic and noted to be holding food in her mouth.  Awakened patient to attempt to have pt swallow and drink fluids but pt continued to fall asleep and drop liquid into her lap.  Removed food and cued pt to position her w/c by the sink for oral hygiene.  Pt able to engage in oral hygiene with extra time and min verbal cues to sequence.  Pt transitioned to outside in w/c to perform w/c mobility up/down long ramp with min-mod A and mod verbal cues for safety and sequencing.  Performed w/c mobility over level ground supervision.  At end of session pt left with SLP.  PM session:  Pt received asleep in w/c, pt still very lethargic this pm.  Transitioned outside in w/c with total A secondary to L shoulder pain and hot pack in place.  Once outside pt engaged in table top game to focus on sustained attention, processing and sequencing and memory with mod-max cues; pt continued to demonstrate delayed process and delayed responses.  Returned to room and transferred w/c > bed and to side lying > supine as below; required assistance to keep RLE  elevated during transfer and to lift RLE into the bed.  Pt able to reposition in bed with extra time.  Pt left in bed with RN present to perform dressing change on RLE.  Therapy Documentation Precautions:  Precautions Precautions: Fall Precaution Comments: combative at times Required Braces or Orthoses: Other Brace/Splint Cervical Brace: At all times Other Brace/Splint: Right PRAFO Restrictions Weight Bearing Restrictions: Yes RLE Weight Bearing: Non weight bearing LLE Weight Bearing: Non weight bearing Other Position/Activity Restrictions: ROM BLE ok EXCEPT R ankle Pain: Pain Assessment Pain Assessment: No/denies pain Pain Score:  (unable to assess-pt in therapy)   Function:  Bed Mobility Roll left and right activity   Assist level: Supervision or verbal cues Roll left and right assistive device: Bedrails  Sit to lying activity   Assist level: Min A and verbal cues to assist with lifting RLE into bed from sitting on slideboard Sit to lying assistive device: Bedrails  Lying to sitting activity   Assist level: Supervision or verbal cues Lying to sitting assistive device: Bedrails  Mobility details Bed mobility details: Verbal cues for precautions/safety   Transfers Sit to stand transfer        Chair/bed transfer   Chair/bed transfer method: Lateral scoot Chair/bed transfer assist level: Set up only Chair/bed transfer assistive device: Sliding board   Chair/bed transfer details: Verbal cues for safe use of DME/AE   Toilet transfer  Car transfer          Locomotion Ambulation          Walk 10 feet activity      Walk 50 feet with 2 turns activity      Walk 150 feet activity      Walk 10 feet on uneven surfaces activity      Stairs          Walk up/down 1 step activity        Walk up/down 4 steps activity      Walk up/down 12 steps activity      Pick up small objects from floor      Wheelchair   Type: Manual Max wheelchair  distance: 150 Assist Level: Supervision or verbal cues  Wheel 50 feet with 2 turns activity   Assist Level: Supervision or verbal cues  Wheel 150 feet activity   Assist Level: Supervision or verbal cues   Cognition Comprehension Comprehension assist level: Understands basic 75 - 89% of the time/ requires cueing 10 - 24% of the time  Expression Expression assist level: Expresses basic 90% of the time/requires cueing < 10% of the time.  Social Interaction Social Interaction assist level: Interacts appropriately 90% of the time - Needs monitoring or encouragement for participation or interaction.  Problem Solving Problem solving assist level: Solves basic 50 - 74% of the time/requires cueing 25 - 49% of the time  Memory Memory assist level: Recognizes or recalls 25 - 49% of the time/requires cueing 50 - 75% of the time    Therapy/Group: Individual Therapy  Edman Circle [facility] 10/14/2014, 12:47 PM

## 2014-10-14 NOTE — Progress Notes (Signed)
Occupational Therapy Note  Patient Details  Name: [name] MRN: [id number] Date of Birth: 1965/04/29  Today's Date: 10/14/2014 OT Individual Time: 1300-1400 OT Individual Time Calculation (min): 60 min   Pt denied pain Individual Therapy  Pt engaged in table activities/tasks with focus on task initiation, sequencing, problem solving, and attention to task.  Pt initially engaged in sequencing every day tasks.  Pt completed activities successfully with more than a reasonable amount of time.  Pt transitioned to problem solving activities requiring mod questioning cues to complete.  Pt exhibited difficulty attending to tasks and required min verbal cues for arousal throughout session.     Lavone Neri [facility] 10/14/2014, 2:49 PM

## 2014-10-14 NOTE — Progress Notes (Signed)
ANTICOAGULATION CONSULT NOTE - Follow Up Consult  Pharmacy Consult for warfarin Indication: VTE prophylaxis  Allergies  Allergen Reactions  . Codeine Nausea And Vomiting  . Codeine Nausea And Vomiting  . Vicodin [Hydrocodone-Acetaminophen] Nausea And Vomiting  . Vicodin [Hydrocodone-Acetaminophen] Nausea And Vomiting    Patient Measurements: Weight: 173 lb 1 oz (78.5 kg)  Vital Signs: Temp: 98.4 F (36.9 C) (09/05 0600) Temp Source: Oral (09/05 0600) BP: 158/79 mmHg (09/05 0600) Pulse Rate: 68 (09/05 0600)  Labs:  Recent Labs  10/14/14 0449  LABPROT 22.8*  INR 2.03*    Estimated Creatinine Clearance: 89.1 mL/min (by C-G formula based on Cr of 0.61).  Assessment: 49 y/o female currently therapeutic on warfarin for VTE prophylaxis. INR 2.03 and remains at goal  Goal of Therapy:  INR 2-3 Monitor platelets by anticoagulation protocol: Yes   Plan:  Warfarin 17.5 mg po daily INR to MWF Monitor for S&S of bleed  Harland German, Pharm D 10/14/2014 11:12 AM      10/14/2014,11:12 AM

## 2014-10-14 NOTE — Plan of Care (Signed)
Problem: RH SKIN INTEGRITY Goal: RH STG ABLE TO PERFORM INCISION/WOUND CARE W/ASSISTANCE STG Able To Perform Incision/Wound Care With Mod Assistance.  Outcome: Not Progressing Total assist with dressing changes.

## 2014-10-15 ENCOUNTER — Inpatient Hospital Stay (HOSPITAL_COMMUNITY): Payer: Medicaid Other | Admitting: Physical Therapy

## 2014-10-15 ENCOUNTER — Inpatient Hospital Stay (HOSPITAL_COMMUNITY): Payer: Medicaid Other | Admitting: Speech Pathology

## 2014-10-15 ENCOUNTER — Inpatient Hospital Stay (HOSPITAL_COMMUNITY): Payer: Medicaid Other | Admitting: Occupational Therapy

## 2014-10-15 ENCOUNTER — Inpatient Hospital Stay (HOSPITAL_COMMUNITY): Payer: Self-pay | Admitting: Occupational Therapy
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
## 2014-10-15 NOTE — Progress Notes (Signed)
Occupational Therapy Session Note  Patient Details  Name: [name] MRN: [id number] Date of Birth: Mar 11, 1965  Today's Date: 10/15/2014 OT Individual Time: 1005 -58  OT Individual Time Calculation (min): 55 min   Short Term Goals: Week 1:  OT Short Term Goal 1 (Week 1): Pt will don UB clothing with setup OT Short Term Goal 1 - Progress (Week 1): Met OT Short Term Goal 2 (Week 1): Pt will thread LB clothing with AE with mod A OT Short Term Goal 2 - Progress (Week 1): Met OT Short Term Goal 3 (Week 1): Pt will transfer to [facility] A/P with max A with 1 person OT Short Term Goal 3 - Progress (Week 1): Progressing toward goal OT Short Term Goal 4 (Week 1): Pt will perform 3/3 grooming at sink with setup OT Short Term Goal 4 - Progress (Week 1): Met   Week 2:  OT Short Term Goal 1 (Week 2): Pt will don LB clothing with set up from bed level. OT Short Term Goal 1 - Progress (Week 2): Progressing toward goal (Pt can do underwear and sock, needs A with pants as they do not stretch) OT Short Term Goal 2 (Week 2): Pt will bathe self with set up from bed level. OT Short Term Goal 2 - Progress (Week 2): Met OT Short Term Goal 3 (Week 2): Pt will transfer on/off BSC with mod A x1. OT Short Term Goal 3 - Progress (Week 2): Met OT Short Term Goal 4 (Week 2): Pt will be able to toilet with mod A for clothing management and set up for hygiene. OT Short Term Goal 4 - Progress (Week 2): Met   Week 3:  OT Short Term Goal 1 (Week 3): Pt will be able to pull underwear over hips from [facility] with min A. OT Short Term Goal 1 - Progress (Week 3): Met OT Short Term Goal 2 (Week 3): Pt will be able transfer from [facility] posterior to wc with min A to manage RLE. OT Short Term Goal 2 - Progress (Week 3): Met   Week 4:  OT Short Term Goal 1 (Week 4): Pt will transfer bed >< BSC with S only. OT Short Term Goal 2 (Week 4): Pt will manage underwear over hips on [facility] with Supervision. OT Short Term Goal 3 (Week 4): Pt will  manage pants over hips on bed with Supervision. OT Short Term Goal 4 (Week 4): Pt will wash L foot with long handled sponge in shower using roll in shower chair.  Skilled Therapeutic Interventions/Progress Updates:  Patient received seated in w/c with OT post bathing and dressing activity. This therapist assisted pt with donning lotion and TED hose to LLE. Therapist mentioned vinegar soaks to help with dry/flaky feet, handout administered. Pt propelled self > day room with mod verbal cues for attention and to make sure she didn't run into anything on left side. Had patient turn on [employer] and read book titles and work on following commands. According to previous OT, pt unable to read [employer] using glasses in room. However, pt states she can read and was able to read the [employer] using glasses. Focused skilled intervention on attention, following commands, problem solving, staying awake. Pt takes extra time to complete tasks.   Therapy Documentation Precautions:  Precautions Precautions: Fall Precaution Comments: combative at times Required Braces or Orthoses: Other Brace/Splint Cervical Brace: At all times Other Brace/Splint: Right PRAFO Restrictions Weight Bearing Restrictions: Yes RLE Weight Bearing:  Non weight bearing LLE Weight Bearing: Non weight bearing Other Position/Activity Restrictions: ROM BLE ok EXCEPT R ankle  Function:  Cognition Comprehension Comprehension assist level: Understands basic 75 - 89% of the time/ requires cueing 10 - 24% of the time  Expression Expression assist level: Expresses basic 75 - 89% of the time/requires cueing 10 - 24% of the time. Needs helper to occlude trach/needs to repeat words.  Social Interaction Social Interaction assist level: Interacts appropriately 90% of the time - Needs monitoring or encouragement for participation or interaction.  Problem Solving Problem solving assist level: Solves basic 25 - 49% of the time - needs direction  more than half the time to initiate, plan or complete simple activities  Memory Memory assist level: Recognizes or recalls 25 - 49% of the time/requires cueing 50 - 75% of the time    Therapy/Group: Individual Therapy  Kimberely Mccannon , MS, OTR/L, CLT Pager: [phone]  10/15/2014, 12:34 PM

## 2014-10-15 NOTE — Progress Notes (Signed)
Speech Language Pathology Weekly Progress and Session Notes  Patient Details  Name: [name] MRN: [id number] Date of Birth: January 22, 1966  Beginning of progress report period: October 08, 2014 End of progress report period: October 15, 2014  Today's Date: 10/15/2014 SLP Individual Time: 1300-1400 SLP Individual Time Calculation (min): 60 min  Short Term Goals: Week 4: SLP Short Term Goal 1 (Week 4): Patient will consume current diet with minimal overt s/s of aspiration and supervision verbal cues for use of swallowing compensatory strategies.  SLP Short Term Goal 1 - Progress (Week 4): Not met SLP Short Term Goal 2 (Week 4): Patient will demonstrate efficient mastication of regular textures without overt s/s of aspiration with supervision verbal cues.  SLP Short Term Goal 2 - Progress (Week 4): Met SLP Short Term Goal 3 (Week 4): Patient will sustain attention to a functional task for 15 minutes with Min A verbal cues for redirection.  SLP Short Term Goal 3 - Progress (Week 4): Not met SLP Short Term Goal 4 (Week 4): Patient will utilize external memory aids for recall of functional information with Min A multimodal cues.  SLP Short Term Goal 4 - Progress (Week 4): Not met SLP Short Term Goal 5 (Week 4): Patient will demonstrate functional problem solving for basic and familiar tasks with Min A multimodal cues.  SLP Short Term Goal 5 - Progress (Week 4): Not met SLP Short Term Goal 6 (Week 4): Patient will utilize an increased vocal intensity to maximize speech intelligibility at the sentence level to 100% with Mod I.  SLP Short Term Goal 6 - Progress (Week 4): Not met    New Short Term Goals: Week 5: SLP Short Term Goal 1 (Week 5): Patient will utilize an increased vocal intensity to maximize speech intelligibility at the sentence level to 100% with Mod I.  SLP Short Term Goal 2 (Week 5): Patient will demonstrate functional problem solving for basic and familiar tasks with Min A  multimodal cues.  SLP Short Term Goal 3 (Week 5): Patient will utilize external memory aids for recall of functional information with Min A multimodal cues.  SLP Short Term Goal 4 (Week 5): Patient will sustain attention to a functional task for 15 minutes with Min A verbal cues for redirection.  SLP Short Term Goal 5 (Week 5): Patient will consume current diet with minimal overt s/s of aspiration and supervision verbal cues for use of swallowing compensatory strategies.   Weekly Progress Updates: Patient has made minimal and inconsistent gains and has met 1 of 6 STG's this reporting period due to improved swallowing and cognitive function. Currently, patient is consuming regular textures with thin liquids with minimal overt s/s of aspiration and requires Min-Mod A multimodal cues for use of swallowing compensatory strategies in regards to small bites and maintaining arousal for PO intake.  Patient is also ~90% intelligible at the phrase and sentence level due to increased vocal intensity with supervision-Min A verbal cues.  Patient is currently demonstrating behaviors consistent with a Rancho Level VI and requires overall Mod A multimodal cues to complete functional and familiar tasks safely in regards to sustained attention, functional problem solving, intellectual awareness and working memory. Patient/family education is ongoing. Patient would benefit from continued skilled SLP intervention to maximize cognitive and swallowing function and speech intelligibility in order to maximize her functional independence prior to discharge.   Intensity: Minumum of 1-2 x/day, 30 to 90 minutes Frequency: 3 to 5 out of 7 days Duration/Length of  Stay: TBD due to SNF placement  Treatment/Interventions: Cognitive remediation/compensation;Cueing hierarchy;Environmental controls;Functional tasks;Dysphagia/aspiration precaution training;Patient/family education;Internal/external aids;Speech/Language  facilitation;Therapeutic Activities   Daily Session   Skilled Therapeutic Interventions: Skilled treatment session focused on cognitive goals. Overall, patient appeared more alert this session and SLP facilitated session by providing extra time and Min-Mod A verbal cues for attention to left field of environment during wheelchair propulsion to and from the dayroom. Patient also required Min A question cues for complex problem solving during a mildly complex, new learning task. Patient required Min A verbal cues for use of an increased vocal intensity at the sentence level to increase speech intelligibility to ~90% accuracy. Patient left upright in wheelchair with all needs within reach. Continue with current plan of care.       Function:   Cognition Comprehension Comprehension assist level: Understands basic 75 - 89% of the time/ requires cueing 10 - 24% of the time  Expression   Expression assist level: Expresses basic 75 - 89% of the time/requires cueing 10 - 24% of the time. Needs helper to occlude trach/needs to repeat words.  Social Interaction Social Interaction assist level: Interacts appropriately 90% of the time - Needs monitoring or encouragement for participation or interaction.  Problem Solving Problem solving assist level: Solves basic 50 - 74% of the time/requires cueing 25 - 49% of the time  Memory Memory assist level: Recognizes or recalls 50 - 74% of the time/requires cueing 25 - 49% of the time   Pain Pain Assessment Pain Assessment: No/denies pain  Therapy/Group: Individual Therapy  [name] 10/15/2014, 2:31 PM

## 2014-10-15 NOTE — Progress Notes (Signed)
Occupational Therapy Session Note  Patient Details  Name: [name] MRN: [id number] Date of Birth: [birth year]  Today's Date: 10/15/2014 OT Individual Time: 4098-1191 OT Individual Time Calculation (min): 95 min    Short Term Goals: Week 4:  OT Short Term Goal 1 (Week 4): Pt will transfer bed >< BSC with S only. OT Short Term Goal 2 (Week 4): Pt will manage underwear over hips on [facility] with Supervision. OT Short Term Goal 3 (Week 4): Pt will manage pants over hips on bed with Supervision. OT Short Term Goal 4 (Week 4): Pt will wash L foot with long handled sponge in shower using roll in shower chair.  Skilled Therapeutic Interventions/Progress Updates:    Pt seen for BADL retraining to facilitate functional mobility with ADL transfers. Pt received in bed and she was very lethargic. Pt needed a great deal of prompting to wake up and sit to EOB. Completed transfer to w/c and then grooming at the sink. Used SB to transfer onto tub bench with mod A as bench was higher than the w/c. (Later adjusted to lower height for easier transfer next ADL session).  On bench, pt used long sponge but needed mod-max cues at times as she was having trouble sequencing. Pt needed max A back to bench as she was having confusion about head hip ratio and kept trying to lean in direction she was moving.  Attempted LB dressing from w/c, but pt needed much more A as she could not laterally lean far enough to move pants over hips.  Pt processing much slower today, constantly closing her eyes.  The next therapist arrived to continue assisting pt with adjusting pants over hips and donning Ted/sock on L foot.  Therapy Documentation Precautions:  Precautions Precautions: Fall Precaution Comments: combative at times Required Braces or Orthoses: Other Brace/Splint Cervical Brace: At all times Other Brace/Splint: Right PRAFO Restrictions Weight Bearing Restrictions: Yes RLE Weight Bearing: Non weight bearing LLE Weight  Bearing: Non weight bearing Other Position/Activity Restrictions: ROM BLE ok EXCEPT R ankle      Pain: Pain Assessment Pain Assessment: No/denies pain     Function:   Eating Eating               Grooming Oral Care,Brush Teeth, Clean Dentures Activity:      Assist Level: More than reasonable amount of time      Wash, Rinse, Dry Face Activity   Assist Level: More than reasonable time      Wash, Rinse, Dry Hands Activity   Assist Level: More than reasonable time      Brush, Comb Hair Activity Brush,comb hair activity did not occur: N/A      Shave Activity          Apply Makeup Activity Apply makeup activity did not occur: Patient does not wear makeup                                                           Bathing Bathing position   Position: Shower  Bathing parts Body parts bathed by patient: Right arm;Left arm;Chest;Abdomen;Front perineal area;Right upper leg;Left upper leg;Buttocks;Left lower leg;Back    Bathing assist Assist Level: Assistive device;Supervision or verbal cues   Set up : To adjust water temperature   Upper Body Dressing/Undressing Upper body dressing  Bra - Perfomed by patient: Thread/unthread right bra strap;Thread/unthread left bra strap;Hook/unhook bra (pull down sports bra)   Pull over shirt/dress - Perfomed by patient: Thread/unthread right sleeve;Thread/unthread left sleeve;Put head through opening;Pull shirt over trunk          Upper body assist     Set up : To obtain clothing/put away   Lower Body Dressing/Undressing Lower body dressing   What is the patient wearing?: Underwear;AFO;Non-skid slipper socks;Pants;Ted Hose Underwear - Performed by patient: Thread/unthread left underwear leg Underwear - Performed by helper: Thread/unthread right underwear leg;Pull underwear up/down Pants- Performed by patient: Thread/unthread left pants leg Pants- Performed by helper: Thread/unthread right pants leg;Pull pants up/down    Non-skid slipper socks- Performed by helper: Don/doff left sock           AFO - Performed by helper: Don/doff right AFO (entry on 10/14/14 an error)   TED Hose - Performed by helper: Don/doff left TED hose  Lower body assist         Toileting Toileting          Toileting assist      Bed Mobility Roll left and right activity        Sit to lying activity   Assist level: Supervision or verbal cues Sit to lying assistive device: Other (comment) (leg lifter)  Lying to sitting activity   Assist level: Supervision or verbal cues    Mobility details Bed mobility details: Visual cues/gestures for precautions/safety;Verbal cues for techniques;Verbal cues for sequencing    Transfers Sit to stand transfer        Chair/bed transfer   Chair/bed transfer method: Lateral scoot Chair/bed transfer assist level: Supervision or verbal cues Chair/bed transfer assistive device: Sliding board   Chair/bed transfer details: Verbal cues for sequencing;Verbal cues for technique;Verbal cues for precautions/safety;Verbal cues for safe use of DME/AE  Toilet transfer                Tub/shower transfer   Tub/shower assistive device: Sliding board;Tub transfer bench;Grab bars   Assist level into tub: Moderate assist (Pt 50 - 74%/lift or lower/lift 2 legs) Assist level out of tub: Maximal assist (Pt 25 - 49%/lift and lower)   Cognition Comprehension Comprehension assist level: Understands basic 75 - 89% of the time/ requires cueing 10 - 24% of the time  Expression Expression assist level: Expresses basic 75 - 89% of the time/requires cueing 10 - 24% of the time. Needs helper to occlude trach/needs to repeat words.  Social Interaction Social Interaction assist level: Interacts appropriately 90% of the time - Needs monitoring or encouragement for participation or interaction.  Problem Solving Problem solving assist level: Solves basic 25 - 49% of the time - needs direction more than half the  time to initiate, plan or complete simple activities  Memory Memory assist level: Recognizes or recalls 25 - 49% of the time/requires cueing 50 - 75% of the time    Therapy/Group: Individual Therapy  Ajahni Nay 10/15/2014, 12:18 PM

## 2014-10-15 NOTE — Progress Notes (Signed)
Physical Therapy Note  Patient Details  Name: [name] MRN: [id number] Date of Birth: 13-Oct-1965 Today's Date: 10/15/2014    After discussion with patient's medical team, patient changed to 3 hours/day from 4.5 hours/day to improve patient participation and arousal as patient awaits SNF placement.   Bayard Hugger A 10/15/2014, 1:03 PM

## 2014-10-15 NOTE — Progress Notes (Signed)
Physical Therapy Session Note  Patient Details  Name: [name] MRN: [id number] Date of Birth: 1965-11-08  Today's Date: 10/15/2014 PT Individual Time: 1100-1200 PT Individual Time Calculation (min): 60 min   Short Term Goals: Week 4:  PT Short Term Goal 1 (Week 4): Patient will sustain attention to functional task for 20 min with supervision.  PT Short Term Goal 2 (Week 4): Patient will transfer bed <> wheelchair with consistent supervision and mod multimodal cues for sequencing and technique.  PT Short Term Goal 3 (Week 4): Patient will manage leg rests during transfers with supervision.  PT Short Term Goal 4 (Week 4): Patient will verbalize sequence for AP or slide board transfer with min verbal cues.   Skilled Therapeutic Interventions/Progress Updates:   Session focused on wheelchair propulsion using BUE in controlled and home environments from room to and from ADL apartment with max verbal cues for sequencing, obstacle negotiation, and efficiency of technique, slide board transfers bed > regular bed in ADL apartment with max multimodal cues for wheelchair setup and parts management and sequencing/technique, and BLE supine therex- assisted SLR RLE and SLR LLE, R/L heel slides with decreased ROM. Patient left sitting in wheelchair with all needs within reach.   Therapy Documentation Precautions:  Precautions Precautions: Fall Precaution Comments: combative at times Required Braces or Orthoses: Other Brace/Splint Cervical Brace: At all times Other Brace/Splint: Right PRAFO Restrictions Weight Bearing Restrictions: Yes RLE Weight Bearing: Non weight bearing LLE Weight Bearing: Non weight bearing Other Position/Activity Restrictions: ROM BLE ok EXCEPT R ankle Pain: Pain Assessment Pain Assessment: No/denies pain   Function:  Toileting Toileting             Bed Mobility Roll left and right activity        Sit to lying activity   Assist level: Supervision or verbal  cues Sit to lying assistive device: Other (comment) (leg lifter)  Lying to sitting activity   Assist level: Supervision or verbal cues    Mobility details Bed mobility details: Visual cues/gestures for precautions/safety;Verbal cues for techniques;Verbal cues for sequencing   Transfers Sit to stand transfer        Chair/bed transfer   Chair/bed transfer method: Lateral scoot Chair/bed transfer assist level: Supervision or verbal cues Chair/bed transfer assistive device: Sliding board   Chair/bed transfer details: Verbal cues for sequencing;Verbal cues for technique;Verbal cues for precautions/safety;Verbal cues for safe use of DME/AE   Toilet transfer                Car transfer          Locomotion Ambulation          Walk 10 feet activity      Walk 50 feet with 2 turns activity      Walk 150 feet activity      Walk 10 feet on uneven surfaces activity      Stairs          Walk up/down 1 step activity        Walk up/down 4 steps activity      Walk up/down 12 steps activity      Pick up small objects from floor      Wheelchair   Type: Manual Max wheelchair distance: 200 ft Assist Level: Supervision or verbal cues  Wheel 50 feet with 2 turns activity   Assist Level: Supervision or verbal cues  Wheel 150 feet activity   Assist Level: Supervision or verbal cues   Cognition Comprehension  Comprehension assist level: Understands basic 75 - 89% of the time/ requires cueing 10 - 24% of the time  Expression Expression assist level: Expresses basic 90% of the time/requires cueing < 10% of the time.  Social Interaction Social Interaction assist level: Interacts appropriately 90% of the time - Needs monitoring or encouragement for participation or interaction.  Problem Solving Problem solving assist level: Solves basic 50 - 74% of the time/requires cueing 25 - 49% of the time  Memory Memory assist level: Recognizes or recalls 25 - 49% of the time/requires cueing  50 - 75% of the time    Therapy/Group: Individual Therapy  Kerney Elbe 10/15/2014, 12:17 PM

## 2014-10-16 ENCOUNTER — Inpatient Hospital Stay (HOSPITAL_COMMUNITY): Payer: Medicaid Other | Admitting: Physical Therapy

## 2014-10-16 ENCOUNTER — Inpatient Hospital Stay (HOSPITAL_COMMUNITY): Payer: Medicaid Other | Admitting: Speech Pathology

## 2014-10-16 ENCOUNTER — Inpatient Hospital Stay (HOSPITAL_COMMUNITY): Payer: Medicaid Other

## 2014-10-16 ENCOUNTER — Inpatient Hospital Stay (HOSPITAL_COMMUNITY): Payer: Medicaid Other | Admitting: Occupational Therapy

## 2014-10-16 LAB — CBC
HCT: 38 % (ref 36.0–46.0)
Hemoglobin: 12 g/dL (ref 12.0–15.0)
MCH: 28.2 pg (ref 26.0–34.0)
MCHC: 31.6 g/dL (ref 30.0–36.0)
MCV: 89.4 fL (ref 78.0–100.0)
Platelets: 302 10*3/uL (ref 150–400)
RBC: 4.25 MIL/uL (ref 3.87–5.11)
RDW: 15.3 % (ref 11.5–15.5)
WBC: 6.5 10*3/uL (ref 4.0–10.5)

## 2014-10-16 LAB — PROTIME-INR
INR: 2.04 — ABNORMAL HIGH (ref 0.00–1.49)
Prothrombin Time: 22.9 seconds — ABNORMAL HIGH (ref 11.6–15.2)

## 2014-10-16 NOTE — Progress Notes (Signed)
Speech Language Pathology Daily Session Note  Patient Details  Name: [name] MRN: [id number] Date of Birth: 09-18-1965  Today's Date: 10/16/2014 SLP Individual Time: 1100-1130 SLP Individual Time Calculation (min): 30 min  Short Term Goals: Week 5: SLP Short Term Goal 1 (Week 5): Patient will utilize an increased vocal intensity to maximize speech intelligibility at the sentence level to 100% with Mod I.  SLP Short Term Goal 2 (Week 5): Patient will demonstrate functional problem solving for basic and familiar tasks with Min A multimodal cues.  SLP Short Term Goal 3 (Week 5): Patient will utilize external memory aids for recall of functional information with Min A multimodal cues.  SLP Short Term Goal 4 (Week 5): Patient will sustain attention to a functional task for 15 minutes with Min A verbal cues for redirection.  SLP Short Term Goal 5 (Week 5): Patient will consume current diet with minimal overt s/s of aspiration and supervision verbal cues for use of swallowing compensatory strategies.   Skilled Therapeutic Interventions: Skilled treatment session focused on cognitive goals. Upon arrival, patient was sitting upright in wheelchair and asking questions in regards to hospitalization and timeline of events. Patient required Mod A question and verbal cues for intellectual awareness in regards to cognitive and physical deficits and current progress/discharge plan. Patient consuming hard candy throughout the session and required Mod verbal cues for safety in regards to not talking with food in her oral cavity and increased arousal for PO intake. Patient left upright in wheelchair with all needs within reach. Continue with current plan of care.    Function:  Cognition Comprehension Comprehension assist level: Understands basic 75 - 89% of the time/ requires cueing 10 - 24% of the time (Simultaneous filing. User may not have seen previous data.)  Expression   Expression assist level:  Expresses basic 75 - 89% of the time/requires cueing 10 - 24% of the time. Needs helper to occlude trach/needs to repeat words. (Simultaneous filing. User may not have seen previous data.)  Social Interaction Social Interaction assist level: Interacts appropriately 90% of the time - Needs monitoring or encouragement for participation or interaction. (Simultaneous filing. User may not have seen previous data.)  Problem Solving Problem solving assist level: Solves basic 50 - 74% of the time/requires cueing 25 - 49% of the time (Simultaneous filing. User may not have seen previous data.)  Memory Memory assist level: Recognizes or recalls 50 - 74% of the time/requires cueing 25 - 49% of the time (Simultaneous filing. User may not have seen previous data.)    Pain No reports of pain   Therapy/Group: Individual Therapy  Deseray Daponte 10/16/2014, 12:29 PM

## 2014-10-16 NOTE — Progress Notes (Signed)
Pt states, "this patch makes me feel really tired. I don't want it anymore." Educated pt on purpose of fentanyl patch with pt continuing to refuse it. Notified Harvel Ricks, PA with pt refusing medication fentanyl patch with new orders to d/c patch. Pt aware of discontinuation of medication with pt responding with a 'smile'.

## 2014-10-16 NOTE — Progress Notes (Signed)
ANTICOAGULATION CONSULT NOTE - Follow Up Consult  Pharmacy Consult for warfarin Indication: VTE prophylaxis  Allergies  Allergen Reactions  . Codeine Nausea And Vomiting  . Codeine Nausea And Vomiting  . Vicodin [Hydrocodone-Acetaminophen] Nausea And Vomiting  . Vicodin [Hydrocodone-Acetaminophen] Nausea And Vomiting    Patient Measurements: Weight: 173 lb 1 oz (78.5 kg)  Vital Signs: Temp: 98.4 F (36.9 C) (09/07 0604) Temp Source: Oral (09/07 0604) BP: 151/79 mmHg (09/07 0604) Pulse Rate: 82 (09/07 0604)  Labs:  Recent Labs  10/14/14 0449 10/16/14 0558  HGB  --  12.0  HCT  --  38.0  PLT  --  302  LABPROT 22.8* 22.9*  INR 2.03* 2.04*    Estimated Creatinine Clearance: 89.1 mL/min (by C-G formula based on Cr of 0.61).  Assessment: 49 y/o female currently therapeutic on warfarin for VTE prophylaxis. INR 2.04 and remains at goal. hgb 12, plt wnl. No bleeding noted per chart.   Goal of Therapy:  INR 2-3 Monitor platelets by anticoagulation protocol: Yes   Plan:  Warfarin 17.5 mg po daily INR to MWF Monitor for S&S of bleed  Bayard Hugger, PharmD, BCPS  Clinical Pharmacist  Pager: [phone]   10/16/2014 12:37 PM
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
## 2014-10-16 NOTE — Progress Notes (Signed)
Occupational Therapy Session Note  Patient Details  Name: [name] MRN: [id number] Date of Birth: 1965/12/21  Today's Date: 10/16/2014 OT Individual Time: 9562-1308 OT Individual Time Calculation (min): 60 min    Short Term Goals: Week 4:  OT Short Term Goal 1 (Week 4): Pt will transfer bed >< BSC with S only. OT Short Term Goal 2 (Week 4): Pt will manage underwear over hips on [facility] with Supervision. OT Short Term Goal 3 (Week 4): Pt will manage pants over hips on bed with Supervision. OT Short Term Goal 4 (Week 4): Pt will wash L foot with long handled sponge in shower using roll in shower chair.  Skilled Therapeutic Interventions/Progress Updates:    Pt seen for BADL retraining of self care of dressing from bed level. Pt stated she used urinal earlier and was bathed earlier. She was wearing the same clothes that she put on after shower yesterday, but pt did not want to bathe today.  Pt dressed in long sitting in bed which enables her to dress more independently. She continues to need some A with R foot as it is very challenging to don pants without getting them hooked on nail pins on bottom of foot. She was able to roll independently to don underwear in supine, min A to pull pants up because of tightness of waistband.  Pt only needed 1 cue to position slide board under thigh with enough space, then transferred to w/c with S. A with leg rests.   Pt concerned with contacting daughter. She propelled self to nursing station to dial long distance phone number with one cue.  Therapist left voicemail, per pt request, to ask for looser, stretchy pants.  Pt continues to have decreased wrist extension, but is able to lift it against gravity approximately 20 - 30 degrees. Educated patient on self massage over wrist joint and AROM of wrist and encouraged her to work on her wrist 3x a day. Pt taken back to room to be set up with breakfast tray.  Nurse aware pt starting breakfast.  Therapy  Documentation Precautions:  Precautions Precautions: Fall Precaution Comments: combative at times Required Braces or Orthoses: Other Brace/Splint Cervical Brace: At all times Other Brace/Splint: Right PRAFO Restrictions Weight Bearing Restrictions: Yes RLE Weight Bearing: Non weight bearing LLE Weight Bearing: Non weight bearing Other Position/Activity Restrictions: ROM BLE ok EXCEPT R ankle      Pain: Pain Assessment Pain Assessment: No/denies pain  Function:   Eating Eating               Grooming Oral Care,Brush Teeth, Clean Dentures Activity:             Wash, Rinse, Dry Face Activity          Wash, Rinse, Dry Hands Activity          Brush, Comb Hair Activity        Shave Activity          Apply Makeup Activity                                                             Bathing Bathing position      Bathing parts      Bathing assist         Upper Body Dressing/Undressing  Upper body dressing     Bra - Perfomed by patient: Thread/unthread right bra strap;Thread/unthread left bra strap;Hook/unhook bra (pull down sports bra)   Pull over shirt/dress - Perfomed by patient: Thread/unthread right sleeve;Thread/unthread left sleeve;Put head through opening;Pull shirt over trunk          Upper body assist     Set up : To obtain clothing/put away   Lower Body Dressing/Undressing Lower body dressing   What is the patient wearing?: Underwear;AFO;Non-skid slipper socks;Pants;[facility] Underwear - Performed by patient: Thread/unthread left underwear leg;Thread/unthread right underwear leg;Pull underwear up/down   Pants- Performed by patient: Thread/unthread left pants leg Pants- Performed by helper: Thread/unthread right pants leg;Pull pants up/down   Non-skid slipper socks- Performed by helper: Don/doff left sock           AFO - Performed by helper: Don/doff right AFO   TED Hose - Performed by helper: Don/doff left TED hose  Lower  body assist         Toileting Toileting          Toileting assist      Bed Mobility Roll left and right activity   Assist level: Supervision or verbal cues Roll left and right assistive device: Bedrails  Sit to lying activity   Assist level: Supervision or verbal cues    Lying to sitting activity   Assist level: Supervision or verbal cues    Mobility details      Transfers Sit to stand transfer        Chair/bed transfer   Chair/bed transfer method: Lateral scoot Chair/bed transfer assist level: Supervision or verbal cues Chair/bed transfer assistive device: Sliding board   Chair/bed transfer details: Verbal cues for technique  Toilet transfer                Tub/shower transfer             Cognition Comprehension Comprehension assist level: Understands basic 75 - 89% of the time/ requires cueing 10 - 24% of the time  Expression Expression assist level: Expresses basic 90% of the time/requires cueing < 10% of the time.  Social Interaction Social Interaction assist level: Interacts appropriately 75 - 89% of the time - Needs redirection for appropriate language or to initiate interaction.  Problem Solving Problem solving assist level: Solves basic 50 - 74% of the time/requires cueing 25 - 49% of the time  Memory Memory assist level: Recognizes or recalls 50 - 74% of the time/requires cueing 25 - 49% of the time    Therapy/Group: Individual Therapy  Abra Lingenfelter 10/16/2014, 10:43 AM

## 2014-10-16 NOTE — Progress Notes (Signed)
Occupational Therapy Note  Patient Details  Name: [name] MRN: [id number] Date of Birth: [birth year]  Today's Date: 10/16/2014 OT Individual Time: 1300-1400 OT Individual Time Calculation (min): 60 min   Pt denied pain Individual Therapy  Pt engaged in w/c mobility tasks, pathfinding activities, problem solving activities, and safety awareness.  Pt required to use hospital signs to navigate to main entrance and return to room.  Pt required min verbal cues to locate directional signs and attend to position of w/c in hallway to prevent from running into equipment and the wall.  Pt noted with decreased attention with rolling w/c.  Demonstrated proper technique for propelling w/c.  Pt required mod multimodal cues to demonstrate proper technique.    Lavone Neri [facility] 10/16/2014, 2:57 PM

## 2014-10-16 NOTE — Progress Notes (Signed)
Social Work Patient ID: [name], female   DOB: 02-Mar-1965, 49 y.o.   MRN: [id number]   Have reviewed team conference with pt and mother.  Both aware and understand that I am continuing to search for SNF bed but also hoping for update from orthopedic service on timing of surgery.  Pt more alert and engaged in discussion today.  Has not questions other than about the status of bed search.  Will continue to follow.  Irine Heminger, LCSW
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
## 2014-10-16 NOTE — Progress Notes (Signed)
Physical Therapy Weekly Progress Note  Patient Details  Name: [name] MRN: [id number] Date of Birth: 03-01-1965  Beginning of progress report period: October 08, 2014 End of progress report period: October 16, 2014  Today's Date: 10/16/2014 PT Individual Time: 1000-1100 PT Individual Time Calculation (min): 60 min   Patient has met 0 of 4 short term goals due to slow and inconsistent progress this reporting period. Patient is currently demonstrating behaviors consistent with Rancho Level VI and requires overall max multimodal cues to complete functional, familiar tasks with emphasis on bed mobility, slide board transfer training, wheelchair set up, and parts management.   Patient continues to demonstrate the following deficits: RLE > LLE pain, generalized weakness, decreased activity tolerance, impaired sustained attention, impaired problem solving, decreased awareness,and impaired memory  and therefore will continue to benefit from skilled PT intervention to enhance overall performance with activity tolerance, balance, postural control, ability to compensate for deficits, functional use of  right upper extremity, right lower extremity, left upper extremity and left lower extremity, attention, awareness, coordination and knowledge of precautions.  Patient progressing toward long term goals.  Continue plan of care.  PT Short Term Goals Week 4:  PT Short Term Goal 1 (Week 4): Patient will sustain attention to functional task for 20 min with supervision.  PT Short Term Goal 1 - Progress (Week 4): Progressing toward goal PT Short Term Goal 2 (Week 4): Patient will transfer bed <> wheelchair with consistent supervision and mod multimodal cues for sequencing and technique.  PT Short Term Goal 2 - Progress (Week 4): Progressing toward goal PT Short Term Goal 3 (Week 4): Patient will manage leg rests during transfers with supervision.  PT Short Term Goal 3 - Progress (Week 4): Progressing toward  goal PT Short Term Goal 4 (Week 4): Patient will verbalize sequence for AP or slide board transfer with min verbal cues.  PT Short Term Goal 4 - Progress (Week 4): Progressing toward goal Week 5:  PT Short Term Goal 1 (Week 5): Patient will transfer bed <> wheelchair with consistent supervision and mod multimodal cues for sequencing and technique.  PT Short Term Goal 2 (Week 5): Patient will manage leg rests during transfers with supervision and max cues. PT Short Term Goal 3 (Week 5): Patient will verbalize sequence for slide board transfer with mod verbal cues.  PT Short Term Goal 4 (Week 5): Patient will perform bed mobility with mod cues.   Skilled Therapeutic Interventions/Progress Updates:   Session focused on wheelchair mobility, path finding, and cognition. Patient requested to purchase drink that is available at gift shop. Propelled wheelchair using BUE from room to gift shop with supervision and max verbal/demonstration cues for efficiency of technique with more than reasonable amount of time. Patient required max verbal cues for sequencing when navigating tight spaces in gift shop and to retrieve drink from cooler. Patient able to complete simple math task to estimate how much change she would get back from her purchase. Patient returned to room and left sitting in wheelchair with all needs within reach.    Therapy Documentation Precautions:  Precautions Precautions: Fall Precaution Comments: combative at times Required Braces or Orthoses: Other Brace/Splint Cervical Brace: At all times Other Brace/Splint: Right PRAFO Restrictions Weight Bearing Restrictions: Yes RLE Weight Bearing: Non weight bearing LLE Weight Bearing: Non weight bearing Other Position/Activity Restrictions: ROM BLE ok EXCEPT R ankle  Pain: Pain Assessment Pain Assessment: No/denies pain   Function:  Naval architect  Bed Mobility Roll left and right activity   Sit to lying  activity   Lying to sitting activity   Mobility details     Transfers Sit to stand transfer        Chair/bed transfer     Toilet transfer                Car transfer         Locomotion Ambulation          Walk 10 feet activity      Walk 50 feet with 2 turns activity      Walk 150 feet activity      Walk 10 feet on uneven surfaces activity      Stairs          Walk up/down 1 step activity        Walk up/down 4 steps activity      Walk up/down 12 steps activity      Pick up small objects from floor      Wheelchair   Type: Manual Max wheelchair distance: 500 ft  Assist Level: Supervision or verbal cues  Wheel 50 feet with 2 turns activity   Assist Level: Supervision or verbal cues  Wheel 150 feet activity   Assist Level: Supervision or verbal cues    Cognition Comprehension Comprehension assist level: Understands basic 75 - 89% of the time/ requires cueing 10 - 24% of the time (Simultaneous filing. User may not have seen previous data.)  Expression Expression assist level: Expresses basic 75 - 89% of the time/requires cueing 10 - 24% of the time. Needs helper to occlude trach/needs to repeat words. (Simultaneous filing. User may not have seen previous data.)  Social Interaction Social Interaction assist level: Interacts appropriately 90% of the time - Needs monitoring or encouragement for participation or interaction. (Simultaneous filing. User may not have seen previous data.)  Problem Solving Problem solving assist level: Solves basic 50 - 74% of the time/requires cueing 25 - 49% of the time (Simultaneous filing. User may not have seen previous data.)  Memory Memory assist level: Recognizes or recalls 50 - 74% of the time/requires cueing 25 - 49% of the time (Simultaneous filing. User may not have seen previous data.)    Therapy/Group: Individual Therapy  Laretta Alstrom 10/16/2014, 12:27 PM

## 2014-10-17 ENCOUNTER — Inpatient Hospital Stay (HOSPITAL_COMMUNITY): Payer: Self-pay

## 2014-10-17 ENCOUNTER — Inpatient Hospital Stay (HOSPITAL_COMMUNITY): Payer: Medicaid Other | Admitting: Occupational Therapy

## 2014-10-17 ENCOUNTER — Inpatient Hospital Stay (HOSPITAL_COMMUNITY): Payer: Medicaid Other | Admitting: Physical Therapy

## 2014-10-17 ENCOUNTER — Inpatient Hospital Stay (HOSPITAL_COMMUNITY): Payer: Medicaid Other | Admitting: Speech Pathology

## 2014-10-17 NOTE — Progress Notes (Signed)
Occupational Therapy Session Note  Patient Details  Name: [name] MRN: [id number] Date of Birth: [birth year]  Today's Date: 10/17/2014 OT Individual Time: 1572-6203 OT Individual Time Calculation (min): 60 min    Short Term Goals: Week 4:  OT Short Term Goal 1 (Week 4): Pt will transfer bed >< BSC with S only. OT Short Term Goal 2 (Week 4): Pt will manage underwear over hips on [facility] with Supervision. OT Short Term Goal 3 (Week 4): Pt will manage pants over hips on bed with Supervision. OT Short Term Goal 4 (Week 4): Pt will wash L foot with long handled sponge in shower using roll in shower chair.  Skilled Therapeutic Interventions/Progress Updates:    Pt seen for BADL retraining of B/D bed level with a focus on pt initiation, attention, sequencing, problem solving.  Pt was much more alert and engaged in conversation. Excellent initiation and problem solving this am. Pt recalled details about a previous therapist well. Pt did become emotional at one point expressing her stress and worry about her situation.  Pt worked on Tax adviser without cues. Pt used slide board to transfer bed to w/c to L with no cues for slide board and A to don R elevated leg rest. Pt completed grooming at sink and was set up in room with all needs met.    Therapy Documentation Precautions:  Precautions Precautions: Fall Precaution Comments: combative at times Required Braces or Orthoses: Other Brace/Splint Cervical Brace: At all times Other Brace/Splint: Right PRAFO Restrictions Weight Bearing Restrictions: Yes RLE Weight Bearing: Non weight bearing LLE Weight Bearing: Non weight bearing Other Position/Activity Restrictions: ROM BLE ok EXCEPT R ankle    Vital Signs: Therapy Vitals Temp: 97.6 F (36.4 C) Temp Source: Oral Pulse Rate: 80 Resp: 16 BP: (!) 118/59 mmHg Patient Position (if appropriate): Lying Oxygen Therapy SpO2: 96 % O2 Device: Not Delivered Pain: Pain Assessment Pain  Assessment: No/denies pain     See Function Navigator for Current Functional Status.   Therapy/Group: Individual Therapy  Ney 10/17/2014, 8:47 AM

## 2014-10-17 NOTE — Progress Notes (Signed)
Physical Therapy Session Note  Patient Details  Name: Jeniya Flannigan MRN: [id number] Date of Birth: [birth year]  Today's Date: 10/17/2014 PT Individual Time: 1000-1100 PT Individual Time Calculation (min): 60 min   Short Term Goals: Week 5:  PT Short Term Goal 1 (Week 5): Patient will transfer bed <> wheelchair with consistent supervision and mod multimodal cues for sequencing and technique.  PT Short Term Goal 2 (Week 5): Patient will manage leg rests during transfers with supervision and max cues. PT Short Term Goal 3 (Week 5): Patient will verbalize sequence for slide board transfer with mod verbal cues.  PT Short Term Goal 4 (Week 5): Patient will perform bed mobility with mod cues.   Skilled Therapeutic Interventions/Progress Updates:   Session focused on slide board transfers, wheelchair mobility, BLE strengthening/ROM, activity tolerance. Patient propelled wheelchair using BUE 2 x 150 ft with improved carryover of cuing for efficiency of propulsion technique and within reasonable amount of time. Patient also demonstrated improved ability to position wheelchair next to mat in preparation for transfer and efficiency of technique, requiring mod verbal cues to get wheelchair close enough to table, release grip of leg lifter to use BUE for transfer, and safe placement of slide board. Patient performed slide board transfers with supervision overall and transferred sit <> supine on mat with supervision and cues for technique and use of leg lifter. Supine BLE therex x 10 each exercise: assisted SLR using leg lifter, hip abduction, hip adduction with pillow squeezes, and assisted heel slides, resisted internal rotation LLE and maintaining RLE in neutral for 5 sec count, with focus on maintaining BLE in neutral instead of external rotation. In wheelchair, patient used laptop to research surgery for ankle with supervision cues. Patient left sitting in wheelchair with all needs within reach.   Therapy  Documentation Precautions:  Precautions Precautions: Fall Precaution Comments: combative at times Required Braces or Orthoses: Other Brace/Splint Cervical Brace: At all times Other Brace/Splint: Right PRAFO Restrictions Weight Bearing Restrictions: Yes RLE Weight Bearing: Non weight bearing LLE Weight Bearing: Non weight bearing Other Position/Activity Restrictions: ROM BLE ok EXCEPT R ankle Pain: Pain Assessment Pain Assessment: No/denies pain  See Function Navigator for Current Functional Status.  Therapy/Group: Individual Therapy  Kerney Elbe 10/17/2014, 10:37 AM

## 2014-10-17 NOTE — Progress Notes (Signed)
Occupational Therapy Note  Patient Details  Name: Rease Swinson MRN: [id number] Date of Birth: 1965-12-14  Today's Date: 10/17/2014 OT Individual Time: 1300-1400 OT Individual Time Calculation (min): 60 min   Pt denied pain Individual Therapy  Pt resting in w/c upon arrival.  Pt had been instructed by SLP in an earlier session to remind me to assist pt with task.  Pt required min questioning cues to initiate conversation/reminder.  Focus on w/c mobility and using [employer] to locate movies to watch.  Pt required max verbal and demonstrational cues for operation of device even though she had assistance with setup from another OT in an earlier session.  Instructed patient on use of device to locate free movies and books.  Pt stated she was only interested in free movies.  Focus on learning new tasks, recall of new information, w/c mobility, task initiation, and attention to task to increased pt's ability to direct care and increase independence with BADLs.   Lavone Neri [facility] 10/17/2014, 2:44 PM
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
## 2014-10-17 NOTE — Progress Notes (Signed)
Speech Language Pathology Daily Session Note  Patient Details  Name: [name] MRN: [id number] Date of Birth: 06/06/1965  Today's Date: 10/17/2014 SLP Individual Time: 2595-6387 SLP Individual Time Calculation (min): 40 min  Short Term Goals: Week 5: SLP Short Term Goal 1 (Week 5): Patient will utilize an increased vocal intensity to maximize speech intelligibility at the sentence level to 100% with Mod I.  SLP Short Term Goal 2 (Week 5): Patient will demonstrate functional problem solving for basic and familiar tasks with Min A multimodal cues.  SLP Short Term Goal 3 (Week 5): Patient will utilize external memory aids for recall of functional information with Min A multimodal cues.  SLP Short Term Goal 4 (Week 5): Patient will sustain attention to a functional task for 15 minutes with Min A verbal cues for redirection.  SLP Short Term Goal 5 (Week 5): Patient will consume current diet with minimal overt s/s of aspiration and supervision verbal cues for use of swallowing compensatory strategies.   Skilled Therapeutic Interventions: Skilled treatment session focused on dysphagia goals. SLP facilitated session by providing skilled observation with lunch meal of regular textures. Patient consumed meal without overt s/s of aspiration and required Min A verbal cues for use of small bites/sips and attention to self-feeding for ~30 minutes. Patient propelled herself to and from the dayroom with extra time and Min A verbal cues for navigation. Patient left upright in wheelchair with all needs within reach.    Function:  Eating Eating   Modified Consistency Diet: No Eating Assist Level: More than reasonable amount of time   Eating Set Up Assist For: Opening containers       Cognition Comprehension Comprehension assist level: Understands basic 75 - 89% of the time/ requires cueing 10 - 24% of the time  Expression   Expression assist level: Expresses basic 75 - 89% of the time/requires cueing  10 - 24% of the time. Needs helper to occlude trach/needs to repeat words.  Social Interaction Social Interaction assist level: Interacts appropriately 90% of the time - Needs monitoring or encouragement for participation or interaction.  Problem Solving Problem solving assist level: Solves basic 50 - 74% of the time/requires cueing 25 - 49% of the time  Memory Memory assist level: Recognizes or recalls 50 - 74% of the time/requires cueing 25 - 49% of the time    Pain Pain Assessment Pain Assessment: No/denies pain  Therapy/Group: Individual Therapy  Kathrynn Backstrom 10/17/2014, 4:50 PM

## 2014-10-18 ENCOUNTER — Inpatient Hospital Stay (HOSPITAL_COMMUNITY): Payer: Medicaid Other | Admitting: Physical Therapy

## 2014-10-18 ENCOUNTER — Inpatient Hospital Stay (HOSPITAL_COMMUNITY): Payer: Medicaid Other | Admitting: Speech Pathology

## 2014-10-18 ENCOUNTER — Inpatient Hospital Stay (HOSPITAL_COMMUNITY): Payer: Medicaid Other | Admitting: Occupational Therapy

## 2014-10-18 LAB — PROTIME-INR
INR: 3.22 — ABNORMAL HIGH (ref 0.00–1.49)
INR: 3.26 — ABNORMAL HIGH (ref 0.00–1.49)
Prothrombin Time: 32.3 seconds — ABNORMAL HIGH (ref 11.6–15.2)
Prothrombin Time: 32.6 seconds — ABNORMAL HIGH (ref 11.6–15.2)

## 2014-10-18 MED ORDER — WARFARIN SODIUM 5 MG PO TABS
10.0000 mg | ORAL_TABLET | Freq: Once | ORAL | Status: AC
  Administered 2014-10-18: 10 mg via ORAL
  Filled 2014-10-18: qty 2

## 2014-10-18 NOTE — Progress Notes (Signed)
Speech Language Pathology Daily Session Note  Patient Details  Name: [name] MRN: [id number] Date of Birth: [birth year]  Today's Date: 10/18/2014 SLP Individual Time: 1000-1100 SLP Individual Time Calculation (min): 60 min  Short Term Goals: Week 5: SLP Short Term Goal 1 (Week 5): Patient will utilize an increased vocal intensity to maximize speech intelligibility at the sentence level to 100% with Mod I.  SLP Short Term Goal 2 (Week 5): Patient will demonstrate functional problem solving for basic and familiar tasks with Min A multimodal cues.  SLP Short Term Goal 3 (Week 5): Patient will utilize external memory aids for recall of functional information with Min A multimodal cues.  SLP Short Term Goal 4 (Week 5): Patient will sustain attention to a functional task for 15 minutes with Min A verbal cues for redirection.  SLP Short Term Goal 5 (Week 5): Patient will consume current diet with minimal overt s/s of aspiration and supervision verbal cues for use of swallowing compensatory strategies.   Skilled Therapeutic Interventions: Skilled treatment session focused on dysphagia and cognitive goals. Upon arrival, patient was supine in bed but agreeable to participate in treatment session. SLP facilitated session by providing supervision question cues for problem solving and sequencing with slide board transfer from the bed to the wheelchair and for wheelchair propulsion to the dayroom. Patient consumed breakfast meal of regular textures with thin liquids without overt s/s of aspiration and required Min A verbal cues for use of swallowing compensatory strategies. SLP also facilitated session by providing Min A question cues for sequencing the steps and generating a list of ingredients for a familiar recipe. Patient handed off to PT. Continue with current plan of care.    Function:  Eating Eating   Modified Consistency Diet: No Eating Assist Level: Supervision or verbal cues;More than  reasonable amount of time   Eating Set Up Assist For: Opening containers       Cognition Comprehension Comprehension assist level: Understands basic 75 - 89% of the time/ requires cueing 10 - 24% of the time  Expression   Expression assist level: Expresses basic 75 - 89% of the time/requires cueing 10 - 24% of the time. Needs helper to occlude trach/needs to repeat words.  Social Interaction Social Interaction assist level: Interacts appropriately 90% of the time - Needs monitoring or encouragement for participation or interaction.  Problem Solving Problem solving assist level: Solves basic 75 - 89% of the time/requires cueing 10 - 24% of the time  Memory Memory assist level: Recognizes or recalls 75 - 89% of the time/requires cueing 10 - 24% of the time    Pain No/Denies Pain   Therapy/Group: Individual Therapy  Osha Errico 10/18/2014, 4:44 PM

## 2014-10-18 NOTE — Progress Notes (Addendum)
ANTICOAGULATION CONSULT NOTE - Follow Up Consult  Pharmacy Consult for Coumadin Indication: VTE prophylaxis  Allergies  Allergen Reactions  . Codeine Nausea And Vomiting  . Codeine Nausea And Vomiting  . Vicodin [Hydrocodone-Acetaminophen] Nausea And Vomiting  . Vicodin [Hydrocodone-Acetaminophen] Nausea And Vomiting    Patient Measurements: Weight: 173 lb 1 oz (78.5 kg) Heparin Dosing Weight:   Vital Signs: Temp: 98.7 F (37.1 C) (09/09 0621) Temp Source: Oral (09/09 0621) BP: 159/72 mmHg (09/09 0621) Pulse Rate: 83 (09/09 0621)  Labs:  Recent Labs  10/16/14 0558 10/18/14 0730 10/18/14 1530  HGB 12.0  --   --   HCT 38.0  --   --   PLT 302  --   --   LABPROT 22.9* 32.3* 32.6*  INR 2.04* 3.22* 3.26*    Estimated Creatinine Clearance: 89.1 mL/min (by C-G formula based on Cr of 0.61).   Medications:  Scheduled:  . chlorhexidine  15 mL Mouth Rinse BID  . clonazePAM  0.5 mg Oral BID  . escitalopram  20 mg Oral Daily  . metoprolol tartrate  50 mg Oral BID  . multivitamin with minerals  1 tablet Oral Daily  . pneumococcal 23 valent vaccine  0.5 mL Intramuscular Tomorrow-1000  . polyethylene glycol  17 g Oral BID  . QUEtiapine  200 mg Oral QHS  . QUEtiapine  50 mg Oral Daily  . senna-docusate  2 tablet Oral QHS  . valproic acid  500 mg Oral BID  . warfarin  17.5 mg Oral q1800  . Warfarin - Pharmacist Dosing Inpatient   Does not apply q1800    Assessment: 49yo female on Coumadin for VTE px.  She has been receiving Coumadin 17.5mg  daily with stable MWF INRs, now with large jump to 3.22 this AM.  No bleeding is noted.  I am concerned that this may not be a valid result.  Goal of Therapy:  INR 2-3 Monitor platelets by anticoagulation protocol: Yes   Plan:  Repeat INR stat Spoke with RN- to hold Coumadin dose until notified of result  Marisue Humble, PharmD Clinical Pharmacist [facility]  ADDN: Repeat INR remains supratherapeutic  at 3.26.  Will lower tonight's dose to  and recheck in the AM. Pt will likely need alternating dosing going forward as 17.5mg  daily continues to become supratherapeutic at multiple doses.  Arlean Hopping. Newman Pies, PharmD Clinical Pharmacist Pager [phone]

## 2014-10-18 NOTE — Progress Notes (Signed)
Physical Therapy Session Note  Patient Details  Name: Auriah Hollings MRN: [id number] Date of Birth: February 11, 1965  Today's Date: 10/18/2014 PT Individual Time: 1430-1500 PT Individual Time Calculation (min): 30 min   Short Term Goals: Week 5:  PT Short Term Goal 1 (Week 5): Patient will transfer bed <> wheelchair with consistent supervision and mod multimodal cues for sequencing and technique.  PT Short Term Goal 2 (Week 5): Patient will manage leg rests during transfers with supervision and max cues. PT Short Term Goal 3 (Week 5): Patient will verbalize sequence for slide board transfer with mod verbal cues.  PT Short Term Goal 4 (Week 5): Patient will perform bed mobility with mod cues.   Skilled Therapeutic Interventions/Progress Updates:    Pt received asleep in bed but easily woken and agreeable to therapy.  PT instructed patient in supine>sit and lateral scoot from EOB to w/c with sliding board with steady A and verbal cues for technique/sequencing.  PT instructed patient in community level w/c mobility for endurance and UE strengthening.  Pt propelled w/c >300' with supervision and verbal cues for improved propulsion with UEs.  Pt able to negotiate 3% grade with min A as patient stopped pushing with UEs half way up grade and began to roll back.  Pt demonstrated no reaction to rolling back.  Pt was able to control w/c when coming back down the 3% grade with no cuing.  Pt returned to room in w/c and positioned with call bell in reach and needs met.    Therapy Documentation Precautions:  Precautions Precautions: Fall Precaution Comments: combative at times Required Braces or Orthoses: Other Brace/Splint Cervical Brace: At all times Other Brace/Splint: Right PRAFO Restrictions Weight Bearing Restrictions: Yes RLE Weight Bearing: Non weight bearing LLE Weight Bearing: Non weight bearing Other Position/Activity Restrictions: ROM BLE ok EXCEPT R ankle  See Function Navigator for Current  Functional Status.   Therapy/Group: Individual Therapy  Earnest Conroy Penven-Crew 10/18/2014, 4:25 PM
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
## 2014-10-18 NOTE — Progress Notes (Signed)
Occupational Therapy Weekly Progress Note  Patient Details  Name: [name] MRN: [id number] Date of Birth: 06-12-1965  Beginning of progress report period: October 11, 2014 End of progress report period: October 18, 2014  Today's Date: 10/18/2014 OT Individual Time: 2446-2863 OT Individual Time Calculation (min): 60 min    Patient has met 4 of 4 short term goals.  Pt has made excellent progress with transfers and lateral weight shifting skills for LB dressing.  Patient continues to demonstrate the following deficits:limited L wrist extension, occasional L shoulder pain, non weight bearing BLE, decreased activity tolerance and therefore will continue to benefit from skilled OT intervention to enhance overall performance with BADL. She continues to be functioning at a [place] level VI.  She is gradually improving with her cognition, but continues to have difficulty with memory and processing.     Patient progressing toward long term goals..  Continue plan of care.  OT Short Term Goals Week 1:  OT Short Term Goal 1 (Week 1): Pt will don UB clothing with setup OT Short Term Goal 1 - Progress (Week 1): Met OT Short Term Goal 2 (Week 1): Pt will thread LB clothing with AE with mod A OT Short Term Goal 2 - Progress (Week 1): Met OT Short Term Goal 3 (Week 1): Pt will transfer to [facility] A/P with max A with 1 person OT Short Term Goal 3 - Progress (Week 1): Progressing toward goal OT Short Term Goal 4 (Week 1): Pt will perform 3/3 grooming at sink with setup OT Short Term Goal 4 - Progress (Week 1): Met Week 2:  OT Short Term Goal 1 (Week 2): Pt will don LB clothing with set up from bed level. OT Short Term Goal 1 - Progress (Week 2): Progressing toward goal (Pt can do underwear and sock, needs A with pants as they do not stretch) OT Short Term Goal 2 (Week 2): Pt will bathe self with set up from bed level. OT Short Term Goal 2 - Progress (Week 2): Met OT Short Term Goal 3 (Week 2): Pt will  transfer on/off BSC with mod A x1. OT Short Term Goal 3 - Progress (Week 2): Met OT Short Term Goal 4 (Week 2): Pt will be able to toilet with mod A for clothing management and set up for hygiene. OT Short Term Goal 4 - Progress (Week 2): Met Week 3:  OT Short Term Goal 1 (Week 3): Pt will be able to pull underwear over hips from [facility] with min A. OT Short Term Goal 1 - Progress (Week 3): Met OT Short Term Goal 2 (Week 3): Pt will be able transfer from [facility] posterior to wc with min A to manage RLE. OT Short Term Goal 2 - Progress (Week 3): Met Week 4:  OT Short Term Goal 1 (Week 4): Pt will transfer bed >< BSC with S only. OT Short Term Goal 1 - Progress (Week 4): Met OT Short Term Goal 2 (Week 4): Pt will manage underwear over hips on [facility] with Supervision. OT Short Term Goal 2 - Progress (Week 4): Met OT Short Term Goal 3 (Week 4): Pt will manage pants over hips on bed with Supervision. OT Short Term Goal 3 - Progress (Week 4): Met (with stretchy pants) OT Short Term Goal 4 (Week 4): Pt will wash L foot with long handled sponge in shower using roll in shower chair. OT Short Term Goal 4 - Progress (Week 4): Met Week 5:  OT  Short Term Goal 1 (Week 5): Pt will transfer on and off tub bench with S using slide board vs. steadying A. OT Short Term Goal 2 (Week 5): Pt will be able to consistently don pants over R AFO with extra time, no A. OT Short Term Goal 3 (Week 5): Pt will demonstrate improved problem solving to log into her kindle with min cues.  Skilled Therapeutic Interventions/Progress Updates:    Pt seen for BADL retraining of shower and dressing with a focus on completing transfers in a time efficient manner as independently as possible. Pt received in bed, used SB to w/c. Completed grooming at sink with faster speed. Used SB to tub bench with good problem solving and attention. Able to bathe using long sponge and no cuing.  Transferred back to chair and then back to bed to continue dressing  from bed level and to rest.  Pt was very tired after completing 4 transfers in less than 45 minutes.  Pt moved self into long sitting to complete dressing.  Therapy Documentation Precautions:  Precautions Precautions: Fall Precaution Comments: combative at times Required Braces or Orthoses: Other Brace/Splint Cervical Brace: At all times Other Brace/Splint: Right PRAFO Restrictions Weight Bearing Restrictions: Yes RLE Weight Bearing: Non weight bearing LLE Weight Bearing: Non weight bearing Other Position/Activity Restrictions: ROM BLE ok EXCEPT R ankle    Vital Signs: Therapy Vitals Temp: 98.7 F (37.1 C) Temp Source: Oral Pulse Rate: 83 Resp: 18 BP: (!) 159/72 mmHg Patient Position (if appropriate): Lying Oxygen Therapy SpO2: 99 % O2 Device: Not Delivered Pain: No c/o pain during OT session.    See Function Navigator for Current Functional Status.   Therapy/Group: Individual Therapy  Sanderson 10/18/2014, 9:06 AM

## 2014-10-18 NOTE — Progress Notes (Signed)
Physical Therapy Session Note  Patient Details  Name: [name] MRN: [id number] Date of Birth: [birth year]  Today's Date: 10/18/2014 PT Individual Time: 1100-1200 PT Individual Time Calculation (min): 60 min   Short Term Goals: Week 5:  PT Short Term Goal 1 (Week 5): Patient will transfer bed <> wheelchair with consistent supervision and mod multimodal cues for sequencing and technique.  PT Short Term Goal 2 (Week 5): Patient will manage leg rests during transfers with supervision and max cues. PT Short Term Goal 3 (Week 5): Patient will verbalize sequence for slide board transfer with mod verbal cues.  PT Short Term Goal 4 (Week 5): Patient will perform bed mobility with mod cues.   Skilled Therapeutic Interventions/Progress Updates:   Session focused on functional transfers, strengthening/ROM, and activity tolerance. Patient performed slide board transfer to raised surface with cues to take off leg rest next to mat table prior to transfer. Patient required supervision for actual transfer and mod verbal cues for technique including hand placement and head hips relationship. Bed mobility using leg lifter on flat surface with supervision and increased time. BLE therex x 20 each exercise: SAQ, hip adduction with assist for RLE, SLR using leg lifter, ankle pumps LLE, L shoulder AAROM using leg lifter due to patient c/o L shoulder pain. Patient performed UBE x 5 min with no c/o shoulder pain (switching between cycling forward and backward every min). Patient required max verbal/demonstration cues for efficient wheelchair propulsion and hand placement on wheelchair rim instead of tire. Patient left sitting in wheelchair with all needs within reach.   Therapy Documentation Precautions:  Precautions Precautions: Fall Precaution Comments: combative at times Required Braces or Orthoses: Other Brace/Splint Cervical Brace: At all times Other Brace/Splint: Right PRAFO Restrictions Weight Bearing  Restrictions: Yes RLE Weight Bearing: Non weight bearing LLE Weight Bearing: Non weight bearing Other Position/Activity Restrictions: ROM BLE ok EXCEPT R ankle Pain: Pain Assessment Pain Assessment: 0-10 Pain Score: 9  Pain Type: Acute pain Pain Location: Shoulder Pain Orientation: Left Pain Descriptors / Indicators: Aching Pain Onset: With Activity Pain Intervention(s): Repositioned;Emotional support   See Function Navigator for Current Functional Status.   Therapy/Group: Individual Therapy  Kerney Elbe 10/18/2014, 11:36 AM

## 2014-10-19 ENCOUNTER — Inpatient Hospital Stay (HOSPITAL_COMMUNITY): Payer: Medicaid Other | Admitting: Physical Therapy

## 2014-10-19 DIAGNOSIS — S8291XD Unspecified fracture of right lower leg, subsequent encounter for closed fracture with routine healing: Secondary | ICD-10-CM

## 2014-10-19 LAB — PROTIME-INR
INR: 3.72 — ABNORMAL HIGH (ref 0.00–1.49)
Prothrombin Time: 36 seconds — ABNORMAL HIGH (ref 11.6–15.2)

## 2014-10-19 LAB — CBC
HCT: 37.3 % (ref 36.0–46.0)
Hemoglobin: 11.7 g/dL — ABNORMAL LOW (ref 12.0–15.0)
MCH: 28.1 pg (ref 26.0–34.0)
MCHC: 31.4 g/dL (ref 30.0–36.0)
MCV: 89.7 fL (ref 78.0–100.0)
Platelets: 293 10*3/uL (ref 150–400)
RBC: 4.16 MIL/uL (ref 3.87–5.11)
RDW: 15.4 % (ref 11.5–15.5)
WBC: 5.8 10*3/uL (ref 4.0–10.5)

## 2014-10-19 MED ORDER — WARFARIN SODIUM 5 MG PO TABS
10.0000 mg | ORAL_TABLET | Freq: Once | ORAL | Status: AC
  Administered 2014-10-19: 10 mg via ORAL
  Filled 2014-10-19: qty 2

## 2014-10-19 NOTE — Progress Notes (Addendum)
ANTICOAGULATION CONSULT NOTE - Follow Up Consult  Pharmacy Consult for Coumadin Indication: VTE prophylaxis  Allergies  Allergen Reactions  . Codeine Nausea And Vomiting  . Codeine Nausea And Vomiting  . Vicodin [Hydrocodone-Acetaminophen] Nausea And Vomiting  . Vicodin [Hydrocodone-Acetaminophen] Nausea And Vomiting    Patient Measurements: Weight: 173 lb 1 oz (78.5 kg) Heparin Dosing Weight:   Vital Signs: Temp: 98.6 F (37 C) (09/10 0500) Temp Source: Oral (09/10 0500) BP: 135/69 mmHg (09/10 0500) Pulse Rate: 98 (09/10 0500)  Labs:  Recent Labs  10/18/14 0730 10/18/14 1530 10/19/14 0441  LABPROT 32.3* 32.6* 36.0*  INR 3.22* 3.26* 3.72*    Estimated Creatinine Clearance: 89.1 mL/min (by C-G formula based on Cr of 0.61).   Medications:  Scheduled:  . chlorhexidine  15 mL Mouth Rinse BID  . clonazePAM  0.5 mg Oral BID  . escitalopram  20 mg Oral Daily  . metoprolol tartrate  50 mg Oral BID  . multivitamin with minerals  1 tablet Oral Daily  . pneumococcal 23 valent vaccine  0.5 mL Intramuscular Tomorrow-1000  . polyethylene glycol  17 g Oral BID  . QUEtiapine  200 mg Oral QHS  . QUEtiapine  50 mg Oral Daily  . senna-docusate  2 tablet Oral QHS  . valproic acid  500 mg Oral BID  . warfarin  10 mg Oral ONCE-1800  . Warfarin - Pharmacist Dosing Inpatient   Does not apply q1800    Assessment: 49yo female on Coumadin for VTE px x8 weeks (began on 09/12/14).  She has been receiving Coumadin 17.5mg  daily with stable MWF INRs, now with large jump to 3.22, then 3.72 this AM.  No bleeding is noted. CBC stable w/ Hgb 11.7, Plt 293.  Goal of Therapy:  INR 2-3 Monitor platelets by anticoagulation protocol: Yes   Plan:  -Coumadin 10 mg today; despite INR  being supratherapeutic, I want to prevent large drop as she has previously been stable on 17.5 mg daily and I fear holding a dose or decreasing too much will make it difficult to get her back to therapeutic range -INR  in the morning -Pt will likely need alternating dosing going forward as 17.5mg  daily continues to become supratherapeutic at multiple doses  -May change qMWF INR to daily pending tomorrow's result and potential need for alternating doses -Monitor for bleeding complications   Arcola Jansky, PharmD Clinical Pharmacy Resident Pager: [phone]

## 2014-10-19 NOTE — Progress Notes (Signed)
Physical Therapy Session Note  Patient Details  Name: [name] MRN: [id number] Date of Birth: 04/17/1965  Today's Date: 10/19/2014 PT Individual Time: 1478-2956 PT Individual Time Calculation (min): 45 min   Short Term Goals: Week 5:  PT Short Term Goal 1 (Week 5): Patient will transfer bed <> wheelchair with consistent supervision and mod multimodal cues for sequencing and technique.  PT Short Term Goal 2 (Week 5): Patient will manage leg rests during transfers with supervision and max cues. PT Short Term Goal 3 (Week 5): Patient will verbalize sequence for slide board transfer with mod verbal cues.  PT Short Term Goal 4 (Week 5): Patient will perform bed mobility with mod cues.   Skilled Therapeutic Interventions/Progress Updates:   Session focused on functional transfers, strengthening, and patient education. Patient in bed, transferred to wheelchair using slide board with setup only and assist to don R elevating leg rest. Patient propelled wheelchair to gym with distant supervision and increased time. In gym, patient reported feeling depressed about current situation and pending R ankle surgery. Education provided on goals of therapy and possible upgrade in LLE weightbearing status as patient has been NWB LLE x approx 8 weeks and impact on functional status following change in WB status. Patient verbalized understanding. Performed UBE x 10 min at level 3.0, alternating cycling forwards/backwards every 2 min. Practiced slide board transfer to/from mat table with supervision overall, one cue to lock brake and one cue for safe placement of slide board. Seated EOM, SAQ x 10  LLE and x 10 assisted SAQ RLE using leg lifter. Patient left sitting in wheelchair with all needs within reach.  Therapy Documentation Precautions:  Precautions Precautions: Fall Precaution Comments: combative at times Required Braces or Orthoses: Other Brace/Splint Cervical Brace: At all times Other Brace/Splint:  Right PRAFO Restrictions Weight Bearing Restrictions: Yes RLE Weight Bearing: Non weight bearing LLE Weight Bearing: Non weight bearing Other Position/Activity Restrictions: ROM BLE ok EXCEPT R ankle Pain: Pain Assessment Pain Assessment: No/denies pain  See Function Navigator for Current Functional Status.   Therapy/Group: Individual Therapy  Kerney Elbe 10/19/2014, 8:32 AM
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
## 2014-10-20 ENCOUNTER — Inpatient Hospital Stay (HOSPITAL_COMMUNITY): Payer: Medicaid Other

## 2014-10-20 DIAGNOSIS — S8291XK Unspecified fracture of right lower leg, subsequent encounter for closed fracture with nonunion: Secondary | ICD-10-CM

## 2014-10-20 DIAGNOSIS — S92901K Unspecified fracture of right foot, subsequent encounter for fracture with nonunion: Secondary | ICD-10-CM

## 2014-10-20 LAB — PROTIME-INR
INR: 2.33 — ABNORMAL HIGH (ref 0.00–1.49)
Prothrombin Time: 25.3 seconds — ABNORMAL HIGH (ref 11.6–15.2)

## 2014-10-20 MED ORDER — WARFARIN SODIUM 2.5 MG PO TABS
17.5000 mg | ORAL_TABLET | Freq: Once | ORAL | Status: AC
  Administered 2014-10-20: 17.5 mg via ORAL
  Filled 2014-10-20: qty 2
  Filled 2014-10-20: qty 1
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
## 2014-10-20 NOTE — Progress Notes (Signed)
ANTICOAGULATION CONSULT NOTE - Follow Up Consult  Pharmacy Consult for Coumadin Indication: VTE prophylaxis  Allergies  Allergen Reactions  . Codeine Nausea And Vomiting  . Codeine Nausea And Vomiting  . Vicodin [Hydrocodone-Acetaminophen] Nausea And Vomiting  . Vicodin [Hydrocodone-Acetaminophen] Nausea And Vomiting    Patient Measurements: Weight: 173 lb 1 oz (78.5 kg) Heparin Dosing Weight:   Vital Signs: Temp: 97.7 F (36.5 C) (09/11 0627) Temp Source: Oral (09/11 0627) BP: 135/73 mmHg (09/11 0627) Pulse Rate: 61 (09/11 0627)  Labs:  Recent Labs  10/18/14 1530 10/19/14 0441 10/19/14 1404 10/20/14 0621  HGB  --   --  11.7*  --   HCT  --   --  37.3  --   PLT  --   --  293  --   LABPROT 32.6* 36.0*  --  25.3*  INR 3.26* 3.72*  --  2.33*    Estimated Creatinine Clearance: 89.1 mL/min (by C-G formula based on Cr of 0.61).   Medications:  Scheduled:  . chlorhexidine  15 mL Mouth Rinse BID  . clonazePAM  0.5 mg Oral BID  . escitalopram  20 mg Oral Daily  . metoprolol tartrate  50 mg Oral BID  . multivitamin with minerals  1 tablet Oral Daily  . pneumococcal 23 valent vaccine  0.5 mL Intramuscular Tomorrow-1000  . polyethylene glycol  17 g Oral BID  . QUEtiapine  200 mg Oral QHS  . QUEtiapine  50 mg Oral Daily  . senna-docusate  2 tablet Oral QHS  . valproic acid  500 mg Oral BID  . Warfarin - Pharmacist Dosing Inpatient   Does not apply q1800    Assessment: 49yo female on Coumadin for VTE px x8 weeks (began on 09/12/14).  She has been receiving Coumadin 17.5mg  daily with stable MWF INRs, then with large jump 3.72. INR today is down to 2.33 after receiving 10 mg.  No bleeding is noted. CBC stable w/ Hgb 11.7, Plt 293.  Goal of Therapy:  INR 2-3 Monitor platelets by anticoagulation protocol: Yes   Plan:  -Coumadin 17.5 mg today  -INR MWF -Last CBC 9/10 -Pt may need alternating dosing going forward as 17.5mg  daily has become supratherapeutic on multiple  occassions. -Monitor for bleeding complications.    Arcola Jansky, PharmD Clinical Pharmacy Resident Pager: [phone] 10/20/2014 12:55 PM

## 2014-10-20 NOTE — Progress Notes (Signed)
Occupational Therapy Session Note  Patient Details  Name: [name] MRN: [id number] Date of Birth: Jul 10, 1965  Today's Date: 10/20/2014 OT Individual Time: 1115-1200 OT Individual Time Calculation (min): 45 min    Short Term Goals: Week 5:  OT Short Term Goal 1 (Week 5): Pt will transfer on and off tub bench with S using slide board vs. steadying A. OT Short Term Goal 2 (Week 5): Pt will be able to consistently don pants over R AFO with extra time, no A. OT Short Term Goal 3 (Week 5): Pt will demonstrate improved problem solving to log into her kindle with min cues.  Skilled Therapeutic Interventions/Progress Updates: Therapeutic activity (30 min) with focus on improved problem-solving using Kindle, improved intellectual awareness (of injury and functional limitations) and ADL-retraining (15 min) with focus on seated grooming at sink.   Pt received supine in bed, deferred bathing/dressing d/t completed previous night.   Pt requests assist with further exploration of her Kindle.   OT educates pt while pt maintains unsupported sitting balance at EOB, intermittently using Kindle after demonstrations from therapist.    Pt then transfers to w/c using slide board and completes grooming with min assist from therapist to prepare her hair using wash cloth.  Pt completes her own setup for self-feeding at end of session.     Therapy Documentation Precautions:  Precautions Precautions: Fall Precaution Comments: combative at times Required Braces or Orthoses: Other Brace/Splint Cervical Brace: At all times Other Brace/Splint: Right PRAFO Restrictions Weight Bearing Restrictions: Yes RLE Weight Bearing: Non weight bearing LLE Weight Bearing: Non weight bearing Other Position/Activity Restrictions: ROM BLE ok EXCEPT R ankle  Pain: Pain Assessment Pain Assessment: 0-10 Pain Score: 7  Pain Type: Acute pain Pain Location: Knee Pain Orientation: Right;Mid Pain Descriptors / Indicators:  Aching Pain Onset: Progressive Patients Stated Pain Goal: 2 Pain Intervention(s): Cold applied;Repositioned;Distraction;Elevated extremity Multiple Pain Sites: No   See Function Navigator for Current Functional Status.   Therapy/Group: Individual Therapy  Sharni Negron 10/20/2014, 12:13 PM

## 2014-10-21 ENCOUNTER — Inpatient Hospital Stay (HOSPITAL_COMMUNITY): Payer: Medicaid Other | Admitting: Physical Therapy

## 2014-10-21 ENCOUNTER — Inpatient Hospital Stay (HOSPITAL_COMMUNITY): Payer: Medicaid Other | Admitting: Speech Pathology

## 2014-10-21 ENCOUNTER — Inpatient Hospital Stay (HOSPITAL_COMMUNITY): Payer: Medicaid Other | Admitting: Occupational Therapy

## 2014-10-21 ENCOUNTER — Inpatient Hospital Stay (HOSPITAL_COMMUNITY): Payer: Self-pay

## 2014-10-21 LAB — URINALYSIS, ROUTINE W REFLEX MICROSCOPIC
Bilirubin Urine: NEGATIVE
Glucose, UA: NEGATIVE mg/dL
Ketones, ur: NEGATIVE mg/dL
Leukocytes, UA: NEGATIVE
Nitrite: NEGATIVE
Protein, ur: NEGATIVE mg/dL
Specific Gravity, Urine: 1.013 (ref 1.005–1.030)
Urobilinogen, UA: 0.2 mg/dL (ref 0.0–1.0)
pH: 7 (ref 5.0–8.0)

## 2014-10-21 LAB — PROTIME-INR
INR: 1.58 — ABNORMAL HIGH (ref 0.00–1.49)
Prothrombin Time: 18.9 seconds — ABNORMAL HIGH (ref 11.6–15.2)

## 2014-10-21 LAB — URINE MICROSCOPIC-ADD ON

## 2014-10-21 MED ORDER — WARFARIN SODIUM 7.5 MG PO TABS
17.5000 mg | ORAL_TABLET | Freq: Once | ORAL | Status: AC
  Administered 2014-10-21: 17.5 mg via ORAL
  Filled 2014-10-21: qty 2
  Filled 2014-10-21: qty 1

## 2014-10-21 MED ORDER — GABAPENTIN 100 MG PO CAPS
100.0000 mg | ORAL_CAPSULE | Freq: Three times a day (TID) | ORAL | Status: DC
  Administered 2014-10-21 – 2014-10-24 (×10): 100 mg via ORAL
  Filled 2014-10-21 (×10): qty 1

## 2014-10-21 NOTE — Progress Notes (Signed)
ANTICOAGULATION CONSULT NOTE - Follow Up Consult  Pharmacy Consult for coumadin Indication: VTE prophylaxis  Allergies  Allergen Reactions  . Codeine Nausea And Vomiting  . Codeine Nausea And Vomiting  . Vicodin [Hydrocodone-Acetaminophen] Nausea And Vomiting  . Vicodin [Hydrocodone-Acetaminophen] Nausea And Vomiting    Patient Measurements: Weight: 173 lb 1 oz (78.5 kg) Heparin Dosing Weight:   Vital Signs: Temp: 97.7 F (36.5 C) (09/12 0608) Temp Source: Oral (09/12 0608) BP: 182/87 mmHg (09/12 0608) Pulse Rate: 78 (09/12 0608)  Labs:  Recent Labs  10/19/14 0441 10/19/14 1404 10/20/14 0621 10/21/14 0541  HGB  --  11.7*  --   --   HCT  --  37.3  --   --   PLT  --  293  --   --   LABPROT 36.0*  --  25.3* 18.9*  INR 3.72*  --  2.33* 1.58*    Estimated Creatinine Clearance: 89.1 mL/min (by C-G formula based on Cr of 0.61).   Medications:  Scheduled:  . chlorhexidine  15 mL Mouth Rinse BID  . clonazePAM  0.5 mg Oral BID  . escitalopram  20 mg Oral Daily  . gabapentin  100 mg Oral TID  . metoprolol tartrate  50 mg Oral BID  . multivitamin with minerals  1 tablet Oral Daily  . pneumococcal 23 valent vaccine  0.5 mL Intramuscular Tomorrow-1000  . polyethylene glycol  17 g Oral BID  . QUEtiapine  200 mg Oral QHS  . QUEtiapine  50 mg Oral Daily  . senna-docusate  2 tablet Oral QHS  . valproic acid  500 mg Oral BID  . Warfarin - Pharmacist Dosing Inpatient   Does not apply q1800   Infusions:    Assessment: 49 yo female is currently on subtherapeutic coumadin for VTE prophylaxis.  INR is down to 1.58? Goal of Therapy:  INR 2-3 Monitor platelets by anticoagulation protocol: Yes   Plan:  - coumadin 17.5 mg po x1 - INR in am  Haytham Maher, Tsz-Yin 10/21/2014,8:35 AM

## 2014-10-21 NOTE — Progress Notes (Signed)
Occupational Therapy Session Note  Patient Details  Name: [name] MRN: [id number] Date of Birth: [birth year]  Today's Date: 10/21/2014 OT Individual Time: 1100-1155 OT Individual Time Calculation (min): 55 min    Short Term Goals: Week 5:  OT Short Term Goal 1 (Week 5): Pt will transfer on and off tub bench with S using slide board vs. steadying A. OT Short Term Goal 2 (Week 5): Pt will be able to consistently don pants over R AFO with extra time, no A. OT Short Term Goal 3 (Week 5): Pt will demonstrate improved problem solving to log into her kindle with min cues.  Skilled Therapeutic Interventions/Progress Updates:    Treatment session with focus on problem solving, recall, and w/c propulsion.  Pt declined bathing and dressing this session, reporting having gotten dressed during PT session and already completing grooming tasks.  Pt reports being frustrated with still being in the hospital, requesting to go outside.  Pt propelled w/c approx 150-200 feet each time with focus on path finding and use of signs to navigate outside.  Pt propelled w/c over incline with min assist from therapist to control w/c from rolling backwards with propulsion.  Pt with decreased problem solving of strategies to increase independence with w/c propulsion in community environment, looking to therapist to assist.    Therapy Documentation Precautions:  Precautions Precautions: Fall Precaution Comments: combative at times Required Braces or Orthoses: Other Brace/Splint Cervical Brace: At all times Other Brace/Splint: Right PRAFO Restrictions Weight Bearing Restrictions: Yes RLE Weight Bearing: Non weight bearing LLE Weight Bearing: Non weight bearing Other Position/Activity Restrictions: ROM BLE ok EXCEPT R ankle Pain: Pain Assessment Pain Assessment: No/denies pain  See Function Navigator for Current Functional Status.   Therapy/Group: Individual Therapy  Rosalio Loud 10/21/2014, 3:08 PM

## 2014-10-21 NOTE — Progress Notes (Signed)
Speech Language Pathology Daily Session Note  Patient Details  Name: [name] MRN: [id number] Date of Birth: 11/11/1965  Today's Date: 10/21/2014 SLP Individual Time: 1531-1600 SLP Individual Time Calculation (min): 29 min  Short Term Goals: Week 5: SLP Short Term Goal 1 (Week 5): Patient will utilize an increased vocal intensity to maximize speech intelligibility at the sentence level to 100% with Mod I.  SLP Short Term Goal 2 (Week 5): Patient will demonstrate functional problem solving for basic and familiar tasks with Min A multimodal cues.  SLP Short Term Goal 3 (Week 5): Patient will utilize external memory aids for recall of functional information with Min A multimodal cues.  SLP Short Term Goal 4 (Week 5): Patient will sustain attention to a functional task for 15 minutes with Min A verbal cues for redirection.  SLP Short Term Goal 5 (Week 5): Patient will consume current diet with minimal overt s/s of aspiration and supervision verbal cues for use of swallowing compensatory strategies.   Skilled Therapeutic Interventions: Skilled treatment session focused on addressing vocal hygiene for improved vocal intensity and quality.  Upon SLP arrival patient perseverating on her vocal quality as a result SLP provided education with teach back opportunities.  Following 3 repetitions patient was able to recall 3/4 strategies.  Patient was also provided with a handout to assist with long term carryover.  Continue with current plan of care.   Function:  Cognition Comprehension Comprehension assist level: Understands basic 75 - 89% of the time/ requires cueing 10 - 24% of the time  Expression   Expression assist level: Expresses basic 75 - 89% of the time/requires cueing 10 - 24% of the time. Needs helper to occlude trach/needs to repeat words.  Social Interaction Social Interaction assist level: Interacts appropriately 90% of the time - Needs monitoring or encouragement for participation or  interaction.  Problem Solving Problem solving assist level: Solves basic 75 - 89% of the time/requires cueing 10 - 24% of the time  Memory Memory assist level: Recognizes or recalls 75 - 89% of the time/requires cueing 10 - 24% of the time    Pain Pain Assessment Pain Assessment: No/denies pain  Therapy/Group: Individual Therapy  [name]., CCC-SLP [phone]  [name] 10/21/2014, 4:05 PM

## 2014-10-21 NOTE — Progress Notes (Signed)
Physical Therapy Session Note  Patient Details  Name: [name] MRN: [id number] Date of Birth: 08/24/1965  Today's Date: 10/21/2014 PT Individual Time: 0800-0900 PT Individual Time Calculation (min): 60 min   Short Term Goals: Week 5:  PT Short Term Goal 1 (Week 5): Patient [name] transfer bed <> wheelchair with consistent supervision and mod multimodal cues for sequencing and technique.  PT Short Term Goal 2 (Week 5): Patient [name] manage leg rests during transfers with supervision and max cues. PT Short Term Goal 3 (Week 5): Patient [name] verbalize sequence for slide board transfer with mod verbal cues.  PT Short Term Goal 4 (Week 5): Patient [name] perform bed mobility with mod cues.   Skilled Therapeutic Interventions/Progress Updates:   Pt received supine in bed; c/o stomach pain as described below. Pt performed lower body dressing with minA for threading RLE, rolling R/L to don pants with supervision and bedrails. Bed >w/c transfer with transfer board and supervision, no cues needed for positioning or safety. Brushed teeth at sink while seated in w/c with mod I.  W/c propulsion indoors/outdoors on level and unlevel surfaces for several trials of 150-200' per trial, requiring rest breaks between due to UE fatigue. Pt expresses concerns regarding upcoming surgery, depression with being in hospital for a long time, states being outside makes her feel more calm about her situation and enjoys the change of scenery. During propulsion outdoors, pt requires minA for controlling speed on decline surface with pt not using hands to slow w/c and appears to expect therapist to slow her down and prevent an accident. Discussed with pt safety during propulsion, importance of maintaining control at all times and not relying on others for safety. Pt states she does not plan on being in a wheelchair when she leaves so she is not very concerned with learning w/c safety. Pt returned to room and remained seated in w/c  with RLE elevated and all needs within reach at completion of session.   Therapy Documentation Precautions:  Precautions Precautions: Fall Precaution Comments: combative at times Required Braces or Orthoses: Other Brace/Splint Cervical Brace: At all times Other Brace/Splint: Right PRAFO Restrictions Weight Bearing Restrictions: Yes RLE Weight Bearing: Non weight bearing LLE Weight Bearing: Non weight bearing Other Position/Activity Restrictions: ROM BLE ok EXCEPT R ankle Pain: Pain Assessment Pain Assessment: 0-10 Pain Score: 4  Faces Pain Scale: Hurts a little bit Pain Type: Acute pain Pain Location: Abdomen Pain Orientation: Mid Pain Descriptors / Indicators: Nagging;Cramping Pain Onset: Gradual Patients Stated Pain Goal: 0 Pain Intervention(s): RN made aware Multiple Pain Sites: No   See Function Navigator for Current Functional Status.   Therapy/Group: Individual Therapy  Vista Lawman 10/21/2014, 9:42 AM

## 2014-10-21 NOTE — Progress Notes (Signed)
Physical Therapy Session Note  Patient Details  Name: [name] MRN: [id number] Date of Birth: Sep 15, 1965  Today's Date: 10/21/2014 PT Individual Time: 0930-1030 PT Individual Time Calculation (min): 60 min   Short Term Goals: Week 4:  PT Short Term Goal 1 (Week 4): Patient will sustain attention to functional task for 20 min with supervision.  PT Short Term Goal 1 - Progress (Week 4): Progressing toward goal PT Short Term Goal 2 (Week 4): Patient will transfer bed <> wheelchair with consistent supervision and mod multimodal cues for sequencing and technique.  PT Short Term Goal 2 - Progress (Week 4): Progressing toward goal PT Short Term Goal 3 (Week 4): Patient will manage leg rests during transfers with supervision.  PT Short Term Goal 3 - Progress (Week 4): Progressing toward goal PT Short Term Goal 4 (Week 4): Patient will verbalize sequence for AP or slide board transfer with min verbal cues.  PT Short Term Goal 4 - Progress (Week 4): Progressing toward goal  Skilled Therapeutic Interventions/Progress Updates:    Patient propelled wheelchair from her room down to gift shop min verbal and visual cues for way finding for cognitive task.  In small area in gift shop supervision for maneuvering chair and min/mod assist collecting items to purchase.  Problem solving through purchasing items too much for amount she had with supervision.  Patient propelled three quarters the distance back to therapy gym.  Transfer to mat via slide board transfer supervision pt set up chair and board, assist to obtain board and place wheelchair parts after she removed them.  Sit to supine supervision min use of leg lifter.  Supine hip adduction/internal rotation x 10 reps w/ 5 sec hold.  Supine to sit supervision with cues and transfer back to chair assist to hold chair and place wheelchair parts.  Patient propelled to her room supervision and ensured she was able to reach all needs including call bell.  Therapy  Documentation Precautions:  Precautions Precautions: Fall Precaution Comments: combative at times Required Braces or Orthoses: Other Brace/Splint Cervical Brace: At all times Other Brace/Splint: Right PRAFO Restrictions Weight Bearing Restrictions: Yes RLE Weight Bearing: Non weight bearing LLE Weight Bearing: Non weight bearing Other Position/Activity Restrictions: ROM BLE ok EXCEPT R ankle Pain: Pain Assessment Pain Assessment: No/denies pain   See Function Navigator for Current Functional Status.   Therapy/Group: Individual Therapy  Rudi Coco [place], [place] [phone] 10/21/2014  10/21/2014, 12:45 PM
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
## 2014-10-22 ENCOUNTER — Inpatient Hospital Stay (HOSPITAL_COMMUNITY): Payer: Self-pay | Admitting: Physical Therapy

## 2014-10-22 ENCOUNTER — Inpatient Hospital Stay (HOSPITAL_COMMUNITY): Payer: Medicaid Other | Admitting: Speech Pathology

## 2014-10-22 ENCOUNTER — Inpatient Hospital Stay (HOSPITAL_COMMUNITY): Payer: Self-pay | Admitting: Occupational Therapy

## 2014-10-22 LAB — PROTIME-INR
INR: 1.99 — ABNORMAL HIGH (ref 0.00–1.49)
Prothrombin Time: 22.5 seconds — ABNORMAL HIGH (ref 11.6–15.2)

## 2014-10-22 MED ORDER — WARFARIN SODIUM 7.5 MG PO TABS
15.0000 mg | ORAL_TABLET | Freq: Once | ORAL | Status: AC
  Administered 2014-10-22: 15 mg via ORAL
  Filled 2014-10-22: qty 2
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
## 2014-10-22 NOTE — Progress Notes (Signed)
Deatra Ina, PA notified of pts BP. No new orders. Will continue to monitor. Rudie Meyer, RN

## 2014-10-22 NOTE — Plan of Care (Signed)
Problem: RH PAIN MANAGEMENT Goal: RH STG PAIN MANAGED AT OR BELOW PT'S PAIN GOAL Pain level 3 or less on a scale of 0-10.  Outcome: Not Progressing Pain in bil flank; asking for pain med q 4 and using heat to area.

## 2014-10-22 NOTE — Progress Notes (Signed)
Occupational Therapy Note  Patient Details  Name: [name] MRN: [id number] Date of Birth: 1965-03-08  Today's Date: 10/22/2014 OT Missed Time: 60 Minutes Missed Time Reason: Patient fatigue;Pain  Pt missed session due to pain and fatigue today.   [facility] 10/22/2014, 3:12 PM

## 2014-10-22 NOTE — Progress Notes (Signed)
ANTICOAGULATION CONSULT NOTE - Follow Up Consult  Pharmacy Consult for coumadin Indication: VTE prophylaxis  Allergies  Allergen Reactions  . Codeine Nausea And Vomiting  . Codeine Nausea And Vomiting  . Vicodin [Hydrocodone-Acetaminophen] Nausea And Vomiting  . Vicodin [Hydrocodone-Acetaminophen] Nausea And Vomiting    Patient Measurements: Weight: 173 lb 1 oz (78.5 kg) Heparin Dosing Weight:   Vital Signs: Temp: 98.2 F (36.8 C) (09/13 0445) Temp Source: Oral (09/13 0445) BP: 175/85 mmHg (09/13 0600) Pulse Rate: 72 (09/13 0600)  Labs:  Recent Labs  10/19/14 1404 10/20/14 0621 10/21/14 0541  HGB 11.7*  --   --   HCT 37.3  --   --   PLT 293  --   --   LABPROT  --  25.3* 18.9*  INR  --  2.33* 1.58*    Estimated Creatinine Clearance: 89.1 mL/min (by C-G formula based on Cr of 0.61).   Medications:  Scheduled:  . chlorhexidine  15 mL Mouth Rinse BID  . clonazePAM  0.5 mg Oral BID  . escitalopram  20 mg Oral Daily  . gabapentin  100 mg Oral TID  . metoprolol tartrate  50 mg Oral BID  . multivitamin with minerals  1 tablet Oral Daily  . pneumococcal 23 valent vaccine  0.5 mL Intramuscular Tomorrow-1000  . polyethylene glycol  17 g Oral BID  . QUEtiapine  200 mg Oral QHS  . QUEtiapine  50 mg Oral Daily  . senna-docusate  2 tablet Oral QHS  . valproic acid  500 mg Oral BID  . Warfarin - Pharmacist Dosing Inpatient   Does not apply q1800   Infusions:    Assessment: 49 yo female is currently on subtherapeutic coumadin for VTE prophylaxis but INR up from 1.99 from 1.58 Goal of Therapy:  INR 2-3 Monitor platelets by anticoagulation protocol: Yes   Plan:  - coumadin 15 mg po x1 - INR in am  Michai Dieppa, Tsz-Yin 10/22/2014,8:26 AM

## 2014-10-22 NOTE — Progress Notes (Signed)
Speech Language Pathology Weekly Progress and Session Note  Patient Details  Name: [name] MRN: [id number] Date of Birth: 1965-03-06  Beginning of progress report period: October 15, 2014 End of progress report period: October 22, 2014  Today's Date: 10/22/2014 SLP Individual Time: 1300-1355 SLP Individual Time Calculation (min): 55 min  Short Term Goals: Week 5: SLP Short Term Goal 1 (Week 5): Patient will utilize an increased vocal intensity to maximize speech intelligibility at the sentence level to 100% with Mod I.  SLP Short Term Goal 1 - Progress (Week 5): Not met SLP Short Term Goal 2 (Week 5): Patient will demonstrate functional problem solving for basic and familiar tasks with Min A multimodal cues.  SLP Short Term Goal 2 - Progress (Week 5): Met SLP Short Term Goal 3 (Week 5): Patient will utilize external memory aids for recall of functional information with Min A multimodal cues.  SLP Short Term Goal 3 - Progress (Week 5): Met SLP Short Term Goal 4 (Week 5): Patient will sustain attention to a functional task for 15 minutes with Min A verbal cues for redirection.  SLP Short Term Goal 4 - Progress (Week 5): Met SLP Short Term Goal 5 (Week 5): Patient will consume current diet with minimal overt s/s of aspiration and supervision verbal cues for use of swallowing compensatory strategies.  SLP Short Term Goal 5 - Progress (Week 5): Not met    New Short Term Goals: Week 6: SLP Short Term Goal 1 (Week 6): Patient will utilize an increased vocal intensity to maximize speech intelligibility at the sentence level to 100% with Mod I.  SLP Short Term Goal 2 (Week 6): Patient will demonstrate functional problem solving for basic and familiar tasks with supervision multimodal cues.  SLP Short Term Goal 3 (Week 6): Patient will utilize external memory aids for recall of functional information with supervision multimodal cues.  SLP Short Term Goal 4 (Week 6): Patient will demonstrate  selective attention to functional task for 30 minutes with Min A verbal cues for redirection.  SLP Short Term Goal 5 (Week 6): Patient will consume current diet with minimal overt s/s of aspiration and supervision verbal cues for use of swallowing compensatory strategies.   Weekly Progress Updates: Patient has made functional gains and has met 3 of 5 STG's this reporting period due to improved cognitive function. Currently, patient is consuming regular textures with thin liquids with minimal overt s/s of aspiration and requires Min A multimodal cues for use of swallowing compensatory strategies in regards to small bites.  Patient is also ~90-100% intelligible at the sentence level due to increased vocal intensity with supervision-Min A verbal cues.  Patient is currently demonstrating behaviors consistent with a Rancho Level VI and requires overall Min A multimodal cues to complete functional and familiar tasks safely in regards to sustained attention, functional problem solving, intellectual awareness and working memory. Patient/family education is ongoing. Patient would benefit from continued skilled SLP intervention to maximize cognitive and swallowing function and speech intelligibility in order to maximize her functional independence prior to discharge.   Intensity: Minumum of 1-2 x/day, 30 to 90 minutes Frequency: 3 to 5 out of 7 days Duration/Length of Stay: TBD due to SNF placement  Treatment/Interventions: Cognitive remediation/compensation;Cueing hierarchy;Environmental controls;Functional tasks;Dysphagia/aspiration precaution training;Patient/family education;Internal/external aids;Speech/Language facilitation;Therapeutic Activities   Daily Session  Skilled Therapeutic Interventions: Skilled treatment session focused on cognitive goals. Upon arrival, patient was awake while supine in bed and agreeable to participate in treatment session as long  as she could participate while in bed.  SLP  facilitated session by providing Min A question cues for recall of her current medications and their functions and for organization of a 4 time per day pill box. Patient recalled vocal hygiene strategies with Mod I and 100% accuracy and was 100% intelligible at the sentence level.  Patient left supine in bed with all needs within reach. Continue with current plan of care.     Function:   Eating Eating   Modified Consistency Diet: No Eating Assist Level: Supervision or verbal cues;Set up assist for   Eating Set Up Assist For: Opening containers       Cognition Comprehension Comprehension assist level: Understands basic 75 - 89% of the time/ requires cueing 10 - 24% of the time  Expression   Expression assist level: Expresses basic 75 - 89% of the time/requires cueing 10 - 24% of the time. Needs helper to occlude trach/needs to repeat words.  Social Interaction Social Interaction assist level: Interacts appropriately 90% of the time - Needs monitoring or encouragement for participation or interaction.  Problem Solving Problem solving assist level: Solves basic 75 - 89% of the time/requires cueing 10 - 24% of the time  Memory Memory assist level: Recognizes or recalls 75 - 89% of the time/requires cueing 10 - 24% of the time   Pain Pain Assessment Pain Assessment: 0-10 Pain Score: 8  Pain Location: Flank Pain Orientation: Left;Right Pain Descriptors / Indicators: Aching;Sore Pain Onset: On-going Patients Stated Pain Goal: 6 Pain Intervention(s): Medication (See eMAR);Repositioned;Heat applied  Therapy/Group: Individual Therapy  [employer] 10/22/2014, 3:27 PM

## 2014-10-22 NOTE — Progress Notes (Signed)
Physical Therapy Note  Patient Details  Name: [name] MRN: [id number] Date of Birth: 09-01-1965 Today's Date: 10/22/2014  Patient missed 75 min skilled physical therapy due to refusal to participate due to back pain. RN aware, discussed use of K pad. Patient left semi reclined in bed with all needs within reach. Will f/u per POC.     Kerney Elbe 10/22/2014, 2:08 PM
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
## 2014-10-23 ENCOUNTER — Inpatient Hospital Stay (HOSPITAL_COMMUNITY): Payer: Medicaid Other | Admitting: Occupational Therapy

## 2014-10-23 ENCOUNTER — Encounter (HOSPITAL_COMMUNITY): Payer: Self-pay | Admitting: *Deleted

## 2014-10-23 ENCOUNTER — Inpatient Hospital Stay (HOSPITAL_COMMUNITY): Payer: Medicaid Other | Admitting: Physical Therapy

## 2014-10-23 LAB — URINE CULTURE

## 2014-10-23 LAB — PROTIME-INR
INR: 1.89 — ABNORMAL HIGH (ref 0.00–1.49)
Prothrombin Time: 21.6 seconds — ABNORMAL HIGH (ref 11.6–15.2)

## 2014-10-23 MED ORDER — WARFARIN SODIUM 2.5 MG PO TABS
17.5000 mg | ORAL_TABLET | Freq: Once | ORAL | Status: AC
  Administered 2014-10-23: 17.5 mg via ORAL
  Filled 2014-10-23: qty 2
  Filled 2014-10-23: qty 1

## 2014-10-23 NOTE — Progress Notes (Signed)
Physical Therapy Discharge Summary  Patient Details  Name: [name] MRN: [id number] Date of Birth: 1965/02/09  Today's Date: 10/23/2014 PT Individual Time: 05-835  30 min   Patient has met 8 of 8 long term goals due to improved activity tolerance, improved balance, improved postural control, increased strength, increased range of motion, decreased pain, ability to compensate for deficits, functional use of  right upper extremity and left upper extremity, improved attention, improved awareness and improved coordination.  Patient to discharge at a wheelchair level Supervision.   Patient's care partner unavailable to provide the necessary physical and cognitive assistance at discharge, therefore patient discharging to SNF.  Reasons goals not met: NA  Recommendation:  Patient will benefit from ongoing skilled PT services in skilled nursing facility setting to continue to advance safe functional mobility, address ongoing impairments in weakness, ROM, swelling, pain, activity tolerance, and minimize fall risk.  Equipment: No equipment provided  Reasons for discharge: treatment goals met and discharge from hospital  Patient/family agrees with progress made and goals achieved: Yes  Skilled Therapeutic Intervention Patient received eating breakfast, cues for smaller bites. Performed rolling to R/L to don pants with mod I and transferred from supine > sit with supervision. Performed slide board transfer bed > wheelchair with mod I and increased time, assist to don R elevating leg rest only. Patient propelled wheelchair to gym with supervision using BUE and reviewed LE HEP for strengthening/ROM and provided handout. Patient with no further questions/concerns regarding discharge. Handoff to OT in room.   PT Discharge Precautions/Restrictions Precautions Required Braces or Orthoses: Other Brace/Splint Other Brace/Splint: Right PRAFO Restrictions Weight Bearing Restrictions: Yes RLE Weight  Bearing: Non weight bearing LLE Weight Bearing: Non weight bearing Other Position/Activity Restrictions: ROM BLE ok EXCEPT R ankle Pain Denies pain Vision/Perception  No changes from baseline Perception Comments: WFL  Cognition Overall Cognitive Status: Impaired/Different from baseline Attention: Alternating Focused Attention: Appears intact Sustained Attention Impairment: Functional complex Alternating Attention: Impaired Alternating Attention Impairment: Functional complex Memory: Impaired Memory Impairment: Decreased short term memory Sensation Sensation Light Touch: Appears Intact Stereognosis: Appears Intact Hot/Cold: Appears Intact Proprioception: Appears Intact Additional Comments: LUE light touch and proprioception WFL Coordination Gross Motor Movements are Fluid and Coordinated: Yes Fine Motor Movements are Fluid and Coordinated: Yes Coordination and Movement Description: UEs functional for basic self care, limited L wrist extension but does not impede function, BLE limited by pain and swelling Motor  Motor Motor: Within Functional Limits Motor - Discharge Observations: generalized weakness, pain  Mobility Bed Mobility Bed Mobility: Rolling Right;Rolling Left;Supine to Sit;Sit to Supine Rolling Right: 6: Modified independent (Device/Increase time) Rolling Left: 6: Modified independent (Device/Increase time) Supine to Sit: 5: Supervision Sit to Supine: 5: Supervision Transfers Transfers: Yes Lateral/Scoot Transfers: With slide board;5: Supervision Locomotion  Ambulation Ambulation: No (BLE NWB) Gait Gait: No (BLE NWB) Stairs / Additional Locomotion Stairs: No (BLE NWB) Ramp: Not tested (comment) Curb: Not tested (comment) Product manager Mobility: Yes Wheelchair Assistance: 5: Careers information officer: Both upper extremities Wheelchair Parts Management: Supervision/cueing Distance: 200 ft  Trunk/Postural Assessment  Cervical  Assessment Cervical Assessment: Within Functional Limits Thoracic Assessment Thoracic Assessment: Within Functional Limits Lumbar Assessment Lumbar Assessment: Exceptions to [facility] (posterior pelvic tilt) Postural Control Postural Control: Within Functional Limits  Balance Balance Balance Assessed: Yes Static Sitting Balance Static Sitting - Balance Support: Feet unsupported;Bilateral upper extremity supported Static Sitting - Level of Assistance: 7: Independent Dynamic Sitting Balance Dynamic Sitting - Balance Support: Feet unsupported;During functional activity;No upper  extremity supported Dynamic Sitting - Level of Assistance: 6: Modified independent (Device/Increase time) Static Standing Balance Static Standing - Level of Assistance: Not tested (comment) (unsafe to attempt due to precautions) Dynamic Standing Balance Dynamic Standing - Level of Assistance: Not tested (comment) (unsafe to attempt due to precautions) Extremity Assessment  RUE Assessment RUE Assessment: Within Functional Limits LUE AROM (degrees) Overall AROM Left Upper Extremity: Within functional limits for tasks assessed LUE Strength LUE Overall Strength: Within Functional Limits for tasks assessed LUE Overall Strength Comments: decreased active wrist extension to 20 degrees, functional grasp LUE Tone LUE Tone: Within Functional Limits RLE Assessment RLE Assessment: Exceptions to [facility] RLE AROM (degrees) Overall AROM Right Lower Extremity: Deficits;Due to precautions;Due to pain;Due to decreased strength RLE Overall AROM Comments: no ankle ROM, limited hip/knee flexion RLE Strength RLE Overall Strength: Deficits RLE Overall Strength Comments: grossly 3-/5 throughout LLE Assessment LLE Assessment: Exceptions to WFL LLE AROM (degrees) Overall AROM Left Lower Extremity: Deficits;Due to decreased strength;Due to pain LLE Overall AROM Comments: limited hip flexion, knee flexion, ankle DF LLE Strength LLE  Overall Strength: Within Functional Limits for tasks assessed LLE Overall Strength Comments: grossly 4 to 5/5 throughout   See Function Navigator for Current Functional Status.  Carney Living A 10/23/2014, 9:17 PM

## 2014-10-23 NOTE — Plan of Care (Signed)
Problem: RH PAIN MANAGEMENT Goal: RH STG PAIN MANAGED AT OR BELOW PT'S PAIN GOAL Pain level 3 or less on a scale of 0-10.  Outcome: Not Progressing Rates back pain above a 3 during my shift

## 2014-10-23 NOTE — Progress Notes (Signed)
ANTICOAGULATION CONSULT NOTE - Follow Up Consult  Pharmacy Consult for coumadin Indication: VTE prophylaxis  Allergies  Allergen Reactions  . Codeine Nausea And Vomiting  . Codeine Nausea And Vomiting  . Vicodin [Hydrocodone-Acetaminophen] Nausea And Vomiting  . Vicodin [Hydrocodone-Acetaminophen] Nausea And Vomiting    Patient Measurements: Weight: 173 lb 1 oz (78.5 kg) Heparin Dosing Weight:   Vital Signs: Temp: 97.9 F (36.6 C) (09/14 0602) Temp Source: Oral (09/14 0602) BP: 157/87 mmHg (09/14 0602) Pulse Rate: 73 (09/14 0602)  Labs:  Recent Labs  10/21/14 0541 10/22/14 0923 10/23/14 0625  LABPROT 18.9* 22.5* 21.6*  INR 1.58* 1.99* 1.89*    Estimated Creatinine Clearance: 89.1 mL/min (by C-G formula based on Cr of 0.61).   Medications:  Scheduled:  . chlorhexidine  15 mL Mouth Rinse BID  . clonazePAM  0.5 mg Oral BID  . escitalopram  20 mg Oral Daily  . gabapentin  100 mg Oral TID  . metoprolol tartrate  50 mg Oral BID  . multivitamin with minerals  1 tablet Oral Daily  . pneumococcal 23 valent vaccine  0.5 mL Intramuscular Tomorrow-1000  . polyethylene glycol  17 g Oral BID  . QUEtiapine  200 mg Oral QHS  . QUEtiapine  50 mg Oral Daily  . senna-docusate  2 tablet Oral QHS  . valproic acid  500 mg Oral BID  . Warfarin - Pharmacist Dosing Inpatient   Does not apply q1800   Infusions:    Assessment: 49 yo female is currently on subtherapeutic coumadin for VTE prophylaxis.  INR down to 1.89 from 1.99.  Goal of Therapy:  INR 2-3 Monitor platelets by anticoagulation protocol: Yes   Plan:  - coumadin 17.5 mg po x1 - INR in am  Nikolina Simerson, Tsz-Yin 10/23/2014,8:20 AM

## 2014-10-23 NOTE — Progress Notes (Signed)
Social Work  Discharge Note  The overall goal for the admission was met for:   Discharge location: No - plan changed to SNF as mother not able to provide the assistance pt needs  Length of Stay: No - LOS extended due to SNF bed search and no payor source  Discharge activity level: Yes - min assist w/c level  Home/community participation: No  Services provided included: MD, RD, PT, OT, SLP, RN, TR, Pharmacy, Neuropsych and [place]: Other: Medicaid and SSD apps pending approvals  Follow-up services arranged: Other: SNF @ [place] (or additional information):  Transport to SNF via [place] verbalized understanding of follow-up arrangements: Yes  Individual responsible for coordination of the follow-up plan: pt/ mother  Confirmed correct DME delivered: NA    HOYLE, LUCY

## 2014-10-23 NOTE — Progress Notes (Signed)
Social Work Patient ID: [name], female   DOB: [birth year], 49 y.o.   MRN: [id number]   Received SNF bed offer today from [facility] [place].  MD and team aware and clearance given for transfer tomorrow.  Have discussed with pt and her mother as well.  Both agreeable, however, disappointed facility is not closer.  Pt and mother also aware that ortho has projected approx two weeks before any further surgery.   I have arranged for [facility] to pick up pt approx noon tomorrow.  Becky Dupree, LCSW to cover in my absence and gather needed documentation to travel with pt.    Shamari Lofquist, LCSW
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
## 2014-10-23 NOTE — Progress Notes (Signed)
Speech Language Pathology Daily Session Note  Patient Details  Name: [name] MRN: [id number] Date of Birth: May 12, 1965  Today's Date: 10/23/2014 SLP Individual Time: 1030-1200 SLP Individual Time Calculation (min): 90 min  Short Term Goals: Week 6: SLP Short Term Goal 1 (Week 6): Patient will utilize an increased vocal intensity to maximize speech intelligibility at the sentence level to 100% with Mod I.  SLP Short Term Goal 2 (Week 6): Patient will demonstrate functional problem solving for basic and familiar tasks with supervision multimodal cues.  SLP Short Term Goal 3 (Week 6): Patient will utilize external memory aids for recall of functional information with supervision multimodal cues.  SLP Short Term Goal 4 (Week 6): Patient will demonstrate selective attention to functional task for 30 minutes with Min A verbal cues for redirection.  SLP Short Term Goal 5 (Week 6): Patient will consume current diet with minimal overt s/s of aspiration and supervision verbal cues for use of swallowing compensatory strategies.   Skilled Therapeutic Interventions: Skilled treatment session focused on community reintegration. Patient participated in an outing to the grocery store with focus on wheelchair propulsion, safety and overall cognitive functioning. All goals were met, please see goal sheet located in the soft chart for further details.    Function:  Cognition Comprehension Comprehension assist level: Understands basic 90% of the time/cues < 10% of the time  Expression   Expression assist level: Expresses basic 90% of the time/requires cueing < 10% of the time.  Social Interaction Social Interaction assist level: Interacts appropriately 90% of the time - Needs monitoring or encouragement for participation or interaction.  Problem Solving Problem solving assist level: Solves basic 90% of the time/requires cueing < 10% of the time  Memory Memory assist level: Recognizes or recalls 75 - 89%  of the time/requires cueing 10 - 24% of the time    Pain Pain Assessment Pain Assessment: No/denies pain  Therapy/Group: Individual Therapy  [name] 10/23/2014, 4:11 PM

## 2014-10-23 NOTE — Progress Notes (Signed)
Physical Therapy Weekly Progress Note  Patient Details  Name: [name] MRN: [id number] Date of Birth: 1965/09/24  Beginning of progress report period: October 16, 2014 End of progress report period: October 23, 2014  Today's Date: 10/23/2014 PT Individual Time: 1400-1500 PT Individual Time Calculation (min): 60 min   Patient has met 4 of 4 short term goals due to improved cognition and arousal. Patient currently demonstrating behaviors consistent with Rancho Level VI and requires min-mod multimodal cues to complete functional tasks such as bed mobility, wheelchair set up, and transfers safely and effectively in regards to attention, problem solving, and memory.   Patient continues to demonstrate the following deficits: RLE > LLE pain, generalized weakness, decreased activity tolerance, impaired sustained attention, impaired problem solving, decreased intellectual awareness, and impaired memory and therefore will continue to benefit from skilled PT intervention to enhance overall performance with activity tolerance, balance, postural control, ability to compensate for deficits, functional use of  right upper extremity and left upper extremity, attention, awareness, coordination and knowledge of precautions.  Patient progressing toward long term goals.  Continue plan of care.  PT Short Term Goals Week 5:  PT Short Term Goal 1 (Week 5): Patient will transfer bed <> wheelchair with consistent supervision and mod multimodal cues for sequencing and technique.  PT Short Term Goal 1 - Progress (Week 5): Met PT Short Term Goal 2 (Week 5): Patient will manage leg rests during transfers with supervision and max cues. PT Short Term Goal 2 - Progress (Week 5): Met PT Short Term Goal 3 (Week 5): Patient will verbalize sequence for slide board transfer with mod verbal cues.  PT Short Term Goal 3 - Progress (Week 5): Met PT Short Term Goal 4 (Week 5): Patient will perform bed mobility with mod cues.   PT Short Term Goal 4 - Progress (Week 5): Met Week 6:  PT Short Term Goal 1 (Week 6): Patient will transfer bed <> wheelchair via slide board with supervision and min multimodal cues for sequencing and technique.  PT Short Term Goal 2 (Week 6): Patient will manage wheelchair parts with supervision and min multimodal cues.  PT Short Term Goal 3 (Week 6): Patient will negotiate up/down ramp with supervision and max verbal cues for safety and technique.  PT Short Term Goal 4 (Week 6): Patient will verbalize pressure relief techniques with min cues.   Skilled Therapeutic Interventions/Progress Updates:   Session focused on wheelchair mobility in community/outdoor environment including throughout hospital, on/off elevators, inclines/declines, uneven surfaces, and up/down ramp with supervision overall and min A initially progressed to supervision to negotiate up ramp. Patient performed slide board transfer wheelchair > bed with min cues for safe slide board placement and sit > supine with supervision overall. Patient left sitting in bed with all needs within reach and visitor present.   Therapy Documentation Precautions:  Precautions Precautions: Fall Precaution Comments: combative at times Required Braces or Orthoses: Other Brace/Splint Cervical Brace: At all times Other Brace/Splint: Right PRAFO Restrictions Weight Bearing Restrictions: Yes RLE Weight Bearing: Non weight bearing LLE Weight Bearing: Non weight bearing Other Position/Activity Restrictions: ROM BLE ok EXCEPT R ankle Pain: Pain Assessment Pain Assessment: No/denies pain   See Function Navigator for Current Functional Status.  Therapy/Group: Individual Therapy  Laretta Alstrom 10/23/2014, 3:02 PM

## 2014-10-23 NOTE — Progress Notes (Signed)
Occupational Therapy Session Note  Patient Details  Name: [name] MRN: [id number] Date of Birth: 03/13/1965  Today's Date: 10/23/2014 OT Individual Time: 3254-9826 OT Individual Time Calculation (min): 58 min    Short Term Goals: Week 5:  OT Short Term Goal 1 (Week 5): Pt will transfer on and off tub bench with S using slide board vs. steadying A. OT Short Term Goal 2 (Week 5): Pt will be able to consistently don pants over R AFO with extra time, no A. OT Short Term Goal 3 (Week 5): Pt will demonstrate improved problem solving to log into her kindle with min cues.  Skilled Therapeutic Interventions/Progress Updates:      Pt seen for BADL retraining of bathing and dressing with a focus on activity tolerance and problem solving. Pt laying on bed on kpad for back pain. Pt reports no pain at rest.  Offered pt shower this am, but she requested bed bath as she has an outing today and did not want to get fatigued.  Pt set up with bathing and dressing supplies. Pt independent in bed with bathing, but continues to need A to don pants over R foot with AFO off to avoid catching fabric on pins on bottom of foot. She did need verbal cues to fully roll L/R to don pants over hips.  Pt transferred to w/c with close S for safety with SB. Pt sat at sink to complete grooming. Discussed her outing today with TR and SLP. Pt able to recall where she was going and what she would need to purchase to make a lasagna later in the week. Good recall of events, pt seems to be progressing with her STM. Pt left in chair with all needs met. Quick release belt discontinued as pt is demonstrating good safety awareness.  Therapy Documentation Precautions:  Precautions Precautions: Fall Precaution Comments: combative at times Required Braces or Orthoses: Other Brace/Splint Cervical Brace: At all times Other Brace/Splint: Right PRAFO Restrictions Weight Bearing Restrictions: Yes RLE Weight Bearing: Non weight bearing LLE  Weight Bearing: Non weight bearing Other Position/Activity Restrictions: ROM BLE ok EXCEPT R ankle    Pain: Pain Assessment Pain Assessment: No/denies pain   See Function Navigator for Current Functional Status.   Therapy/Group: Individual Therapy  [place] 10/23/2014, 10:10 AM

## 2014-10-23 NOTE — Discharge Summary (Signed)
Discharge summary job # [id number]

## 2014-10-24 ENCOUNTER — Inpatient Hospital Stay (HOSPITAL_COMMUNITY): Payer: Self-pay | Admitting: Speech Pathology

## 2014-10-24 ENCOUNTER — Inpatient Hospital Stay (HOSPITAL_COMMUNITY): Payer: Medicaid Other | Admitting: Physical Therapy

## 2014-10-24 ENCOUNTER — Inpatient Hospital Stay (HOSPITAL_COMMUNITY): Payer: Self-pay | Admitting: Physical Therapy

## 2014-10-24 ENCOUNTER — Inpatient Hospital Stay (HOSPITAL_COMMUNITY): Payer: Self-pay | Admitting: Occupational Therapy

## 2014-10-24 LAB — PROTIME-INR
INR: 1.91 — ABNORMAL HIGH (ref 0.00–1.49)
Prothrombin Time: 21.8 seconds — ABNORMAL HIGH (ref 11.6–15.2)

## 2014-10-24 MED ORDER — OXYCODONE HCL 10 MG PO TABS: 10.0000 mg | ORAL_TABLET | ORAL | Status: DC | PRN

## 2014-10-24 MED ORDER — QUETIAPINE FUMARATE 50 MG PO TABS: 50.0000 mg | ORAL_TABLET | Freq: Every day | ORAL | Status: DC

## 2014-10-24 MED ORDER — WARFARIN SODIUM 7.5 MG PO TABS: 17.5000 mg | ORAL_TABLET | Freq: Once | ORAL | Status: DC

## 2014-10-24 MED ORDER — CLONAZEPAM 0.5 MG PO TABS: 0.5000 mg | ORAL_TABLET | Freq: Two times a day (BID) | ORAL | Status: DC

## 2014-10-24 MED ORDER — QUETIAPINE FUMARATE 200 MG PO TABS: 200.0000 mg | ORAL_TABLET | Freq: Every day | ORAL | Status: DC

## 2014-10-24 NOTE — Progress Notes (Signed)
Pt prepared for discharge to SNF. Report called to [facility] facility. Pt's belongings packed and pt taken to transportation [place].

## 2014-10-24 NOTE — Plan of Care (Signed)
Problem: RH Toileting Goal: LTG Patient will perform toileting w/assist, cues/equip (OT) LTG: Patient will perform toiletiing (clothes management/hygiene) with assist, with/without cues using equipment (OT)  Outcome: Not Applicable Date Met:  50/38/88 LTG N/A as pt prefers to use bedpan versus BSC.

## 2014-10-24 NOTE — Progress Notes (Signed)
Speech Language Pathology Discharge Summary  Patient Details  Name: [name] MRN: [id number] Date of Birth: 1965-10-25   Patient has met 8 of 8 long term goals.  Patient to discharge at overall Min;Mod level.   Reasons goals not met: N/A   Clinical Impression/Discharge Summary: Patient has made functional gains and has met 8 of 8 LTG's this reporting period due to improved cognitive and swallowing function. Currently, patient is consuming regular textures with thin liquids with minimal overt s/s of aspiration and requires supervision verbal cues for use of swallowing compensatory strategies in regards to small bites.  Patient continues to demonstrate a hoarse vocal quality and is ~90-100% intelligible at the sentence level due to increased vocal intensity with supervision-Min A verbal cues.  Patient is currently demonstrating behaviors consistent with a Rancho Level VII and requires overall Min A multimodal cues to complete functional and familiar tasks safely in regards to attention, functional problem solving, awareness and working memory. Patient's family is unable to provide the necessary assistance needed at this time, therefore, patient is discharging to a SNF. Patient would benefit from f/u skilled SLP intervention to maximize cognitive and swallowing function and speech intelligibility in order to maximize her functional independence.   Care Partner:  Caregiver Able to Provide Assistance: No  Type of Caregiver Assistance: Physical;Cognitive  Recommendation:  24 hour supervision/assistance;Skilled Nursing facility  Rationale for SLP Follow Up: Maximize cognitive function and independence;Reduce caregiver burden;Maximize functional communication   Equipment: N/A    Reasons for discharge: Treatment goals met;Discharged from hospital   Patient/Family Agrees with Progress Made and Goals Achieved: Yes   Function:    Cognition Comprehension Comprehension assist level: Understands  basic 90% of the time/cues < 10% of the time  Expression   Expression assist level: Expresses basic 90% of the time/requires cueing < 10% of the time.  Social Interaction Social Interaction assist level: Interacts appropriately 90% of the time - Needs monitoring or encouragement for participation or interaction.  Problem Solving Problem solving assist level: Solves basic 75 - 89% of the time/requires cueing 10 - 24% of the time  Memory Memory assist level: Recognizes or recalls 75 - 89% of the time/requires cueing 10 - 24% of the time   [name] 10/24/2014, 4:18 PM

## 2014-10-24 NOTE — Progress Notes (Signed)
Occupational Therapy Session Note  Patient Details  Name: [name] MRN: [id number] Date of Birth: 08/14/1965  Today's Date: 10/24/2014 OT Individual Time: 0830-0930 OT Individual Time Calculation (min): 60 min    Short Term Goals: Week 5:  OT Short Term Goal 1 (Week 5): Pt will transfer on and off tub bench with S using slide board vs. steadying A. OT Short Term Goal 2 (Week 5): Pt will be able to consistently don pants over R AFO with extra time, no A. OT Short Term Goal 3 (Week 5): Pt will demonstrate improved problem solving to log into her kindle with min cues.  Skilled Therapeutic Interventions/Progress Updates:    Pt seen this session to facilitate functional mobility with shower/ tub bench transfers and w/c to bed for dressing.  Pt received in w/c and completed SB to tub bench with assist to stabilize board on bench. Pt doffed clothing over hips independently with lateral leans. R foot/AFO covered. Pt able to shower using long sponge with distant supervision (standing outside bathroom door).  Pt transferred back to w/c with min A to guide hips using pad on board and verbal cues for head/hip orientation.  Pt then transferred to bed using SB with S to complete dressing. AFO left on foot to avoid catching fabric on pins. She needed A to start pants over R foot and then was able to continue dressing without assist.  Reviewed pt's progress in rehab and discussed what she needs to continue to work on her her next placement.  Pt was somewhat emotional about going to a new facility, and expressed her thoughts clearly.  Pt resting in bed with all needs met.  Therapy Documentation Precautions:  Precautions Precautions: Fall Precaution Comments: combative at times Required Braces or Orthoses: Other Brace/Splint Cervical Brace: At all times Other Brace/Splint: Right PRAFO Restrictions Weight Bearing Restrictions: Yes RLE Weight Bearing: Non weight bearing LLE Weight Bearing: Non weight  bearing Other Position/Activity Restrictions: ROM BLE ok EXCEPT R ankle    Pain: Pain Assessment Pain Assessment: No/denies pain ADL:  See Function Navigator for Current Functional Status.   Therapy/Group: Individual Therapy  , 10/24/2014, 10:48 AM  

## 2014-10-24 NOTE — Progress Notes (Signed)
ANTICOAGULATION CONSULT NOTE - Follow Up Consult  Pharmacy Consult for couamdin Indication: VTE prophylaxis  Allergies  Allergen Reactions  . Codeine Nausea And Vomiting  . Codeine Nausea And Vomiting  . Vicodin [Hydrocodone-Acetaminophen] Nausea And Vomiting  . Vicodin [Hydrocodone-Acetaminophen] Nausea And Vomiting    Patient Measurements: Weight: 173 lb 1 oz (78.5 kg) Heparin Dosing Weight:   Vital Signs: Temp: 99.4 F (37.4 C) (09/15 0541) Temp Source: Oral (09/15 0541) BP: 162/80 mmHg (09/15 0541) Pulse Rate: 77 (09/15 0541)  Labs:  Recent Labs  10/22/14 0923 10/23/14 0625 10/24/14 0532  LABPROT 22.5* 21.6* 21.8*  INR 1.99* 1.89* 1.91*    Estimated Creatinine Clearance: 89.1 mL/min (by C-G formula based on Cr of 0.61).   Medications:  Scheduled:  . chlorhexidine  15 mL Mouth Rinse BID  . clonazePAM  0.5 mg Oral BID  . escitalopram  20 mg Oral Daily  . gabapentin  100 mg Oral TID  . metoprolol tartrate  50 mg Oral BID  . multivitamin with minerals  1 tablet Oral Daily  . pneumococcal 23 valent vaccine  0.5 mL Intramuscular Tomorrow-1000  . polyethylene glycol  17 g Oral BID  . QUEtiapine  200 mg Oral QHS  . QUEtiapine  50 mg Oral Daily  . senna-docusate  2 tablet Oral QHS  . valproic acid  500 mg Oral BID  . Warfarin - Pharmacist Dosing Inpatient   Does not apply q1800   Infusions:    Assessment: 49 yo female is currently on subtherapeutic coumadin for VTE prophylaxis.  INR is up to 1.91 from 1.89.  Goal of Therapy:  INR 2-3 Monitor platelets by anticoagulation protocol: Yes   Plan:  - coumadin 17.5 mg po x1 - INR in am if she is still here  Hillary Schwegler, Tsz-Yin 10/24/2014,8:34 AM
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
## 2014-10-24 NOTE — Progress Notes (Signed)
Occupational Therapy Discharge Summary  Patient Details  Name: Paysley Poplar MRN: [id number] Date of Birth: 02/19/1965   Patient has met 81 of 10 long term goals due to improved activity tolerance, improved balance, ability to compensate for deficits, functional use of  LEFT upper extremity, improved attention, improved awareness and improved coordination.  Patient to discharge at overall supervision with transfers and  Min Assist level with LB dressing. Pt continues to be non weightbearing through BLE.  Patient's care partner unavailable to provide the necessary physical assistance at discharge.  She has made good progress with her cognition and is functioning at a Rancho Level 7 more consistently this last week.   Reasons goals not met: Toileting goal not applicable.  Pt did work on toileting skills numerous times during her stay using a BSC. These last 2 weeks, pt has declined using BSC stating she prefers the bed pan.    Recommendation:  Patient will benefit from ongoing skilled OT services in skilled nursing facility setting to continue to advance functional skills in the area of BADL and iADL.  Equipment: long handled sponge  Reasons for discharge: treatment goals met  Patient/family agrees with progress made and goals achieved: Yes  OT Discharge  Vision/Perception  Vision- History Patient Visual Report: No change from baseline Vision- Assessment Vision Assessment?: No apparent visual deficits Perception Comments: WFL  Cognition Overall Cognitive Status: Impaired/Different from baseline Attention: Alternating Focused Attention: Appears intact Sustained Attention Impairment: Functional complex Alternating Attention: Impaired Alternating Attention Impairment: Functional complex Memory: Impaired Memory Impairment: Decreased short term memory Sensation Sensation Stereognosis: Appears Intact Hot/Cold: Appears Intact Additional Comments: LUE light touch and proprioception  WFL Coordination Fine Motor Movements are Fluid and Coordinated: Yes (functional in UEs) Coordination and Movement Description: UEs functional for basic self care, limited L wrist extension but does not impede function Motor  Motor Motor - Discharge Observations: generalized weakness, pain Trunk/Postural Assessment  Cervical Assessment Cervical Assessment: Within Functional Limits Thoracic Assessment Thoracic Assessment: Within Functional Limits Lumbar Assessment Lumbar Assessment: Exceptions to [facility] (posterior pelvic tilt) Postural Control Postural Control: Within Functional Limits  Balance Static Sitting Balance Static Sitting - Level of Assistance: 7: Independent Dynamic Sitting Balance Dynamic Sitting - Level of Assistance: 6: Modified independent (Device/Increase time) Extremity/Trunk Assessment RUE Assessment RUE Assessment: Within Functional Limits LUE AROM (degrees) Overall AROM Left Upper Extremity: Within functional limits for tasks assessed LUE Strength LUE Overall Strength: Within Functional Limits for tasks assessed LUE Overall Strength Comments: decreased active wrist extension to 20 degrees, functional grasp LUE Tone LUE Tone: Within Functional Limits   See Function Navigator for Current Functional Status.  [place] 10/24/2014, 12:02 PM

## 2014-11-13 ENCOUNTER — Encounter: Payer: Self-pay | Admitting: Physical Medicine & Rehabilitation

## 2014-11-27 ENCOUNTER — Encounter: Payer: Self-pay | Admitting: Physical Medicine & Rehabilitation

## 2015-02-17 ENCOUNTER — Encounter (HOSPITAL_COMMUNITY): Payer: Self-pay | Admitting: *Deleted

## 2015-02-17 MED ORDER — CHLORHEXIDINE GLUCONATE 4 % EX LIQD: 60.0000 mL | Freq: Once | CUTANEOUS | Status: DC

## 2015-02-17 MED ORDER — LACTATED RINGERS IV SOLN
INTRAVENOUS | Status: DC
Start: 2015-02-18 — End: 2015-02-18

## 2015-02-17 MED ORDER — CEFAZOLIN SODIUM-DEXTROSE 2-3 GM-% IV SOLR
2.0000 g | INTRAVENOUS | Status: AC
  Administered 2015-02-18: 2 g via INTRAVENOUS
  Filled 2015-02-17: qty 50

## 2015-02-17 NOTE — Progress Notes (Signed)
Pt has hx of CAD with stents placed in 2012. Cardiologist is Dr. Verdis PrimeHenry Smith. Denies any recent chest pain or sob.   Echo - 09/03/14 in [facility] EKG - 08/30/14 in [facility] Stress - 10/3013 [facility]
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
## 2015-02-18 ENCOUNTER — Observation Stay (HOSPITAL_COMMUNITY): Payer: Medicaid Other

## 2015-02-18 ENCOUNTER — Inpatient Hospital Stay (HOSPITAL_COMMUNITY): Payer: Medicaid Other | Admitting: Vascular Surgery

## 2015-02-18 ENCOUNTER — Ambulatory Visit (HOSPITAL_COMMUNITY)
Admission: RE | Admit: 2015-02-18 | Discharge: 2015-02-20 | Disposition: A | Discharge status: Home / Self Care | Payer: Medicaid Other | Source: Ambulatory Visit | Attending: Orthopedic Surgery | Admitting: Orthopedic Surgery

## 2015-02-18 ENCOUNTER — Encounter (HOSPITAL_COMMUNITY)
Admission: RE | Disposition: A | Discharge status: Home / Self Care | Payer: Self-pay | Source: Ambulatory Visit | Attending: Orthopedic Surgery

## 2015-02-18 ENCOUNTER — Inpatient Hospital Stay (HOSPITAL_COMMUNITY): Payer: Medicaid Other

## 2015-02-18 DIAGNOSIS — Z955 Presence of coronary angioplasty implant and graft: Secondary | ICD-10-CM | POA: Diagnosis not present

## 2015-02-18 DIAGNOSIS — N390 Urinary tract infection, site not specified: Secondary | ICD-10-CM | POA: Diagnosis present

## 2015-02-18 DIAGNOSIS — M24561 Contracture, right knee: Secondary | ICD-10-CM | POA: Diagnosis not present

## 2015-02-18 DIAGNOSIS — I10 Essential (primary) hypertension: Secondary | ICD-10-CM | POA: Diagnosis not present

## 2015-02-18 DIAGNOSIS — Z87891 Personal history of nicotine dependence: Secondary | ICD-10-CM | POA: Diagnosis not present

## 2015-02-18 DIAGNOSIS — M19079 Primary osteoarthritis, unspecified ankle and foot: Secondary | ICD-10-CM | POA: Diagnosis not present

## 2015-02-18 DIAGNOSIS — E785 Hyperlipidemia, unspecified: Secondary | ICD-10-CM | POA: Diagnosis not present

## 2015-02-18 DIAGNOSIS — F319 Bipolar disorder, unspecified: Secondary | ICD-10-CM | POA: Diagnosis not present

## 2015-02-18 DIAGNOSIS — S92001K Unspecified fracture of right calcaneus, subsequent encounter for fracture with nonunion: Secondary | ICD-10-CM

## 2015-02-18 DIAGNOSIS — I2510 Atherosclerotic heart disease of native coronary artery without angina pectoris: Secondary | ICD-10-CM | POA: Diagnosis not present

## 2015-02-18 DIAGNOSIS — Z419 Encounter for procedure for purposes other than remedying health state, unspecified: Secondary | ICD-10-CM

## 2015-02-18 DIAGNOSIS — S92001A Unspecified fracture of right calcaneus, initial encounter for closed fracture: Secondary | ICD-10-CM | POA: Diagnosis not present

## 2015-02-18 DIAGNOSIS — G4730 Sleep apnea, unspecified: Secondary | ICD-10-CM | POA: Insufficient documentation

## 2015-02-18 DIAGNOSIS — I252 Old myocardial infarction: Secondary | ICD-10-CM | POA: Insufficient documentation

## 2015-02-18 DIAGNOSIS — Z01818 Encounter for other preprocedural examination: Secondary | ICD-10-CM

## 2015-02-18 HISTORY — PX: ANKLE FUSION: SHX5718

## 2015-02-18 HISTORY — PX: HARVEST BONE GRAFT: SHX377

## 2015-02-18 HISTORY — DX: Constipation, unspecified: K59.00

## 2015-02-18 HISTORY — PX: KNEE ARTHROSCOPY: SHX127

## 2015-02-18 HISTORY — DX: Failed or difficult intubation, initial encounter: T88.4XXA

## 2015-02-18 HISTORY — PX: KNEE FUSION: SHX690

## 2015-02-18 HISTORY — DX: Anemia, unspecified: D64.9

## 2015-02-18 HISTORY — DX: Calculus of kidney: N20.0

## 2015-02-18 LAB — COMPREHENSIVE METABOLIC PANEL
ALT: 8 U/L — ABNORMAL LOW (ref 14–54)
AST: 17 U/L (ref 15–41)
Albumin: 3.3 g/dL — ABNORMAL LOW (ref 3.5–5.0)
Alkaline Phosphatase: 71 U/L (ref 38–126)
Anion gap: 8 (ref 5–15)
BUN: 5 mg/dL — ABNORMAL LOW (ref 6–20)
CO2: 25 mmol/L (ref 22–32)
Calcium: 8.9 mg/dL (ref 8.9–10.3)
Chloride: 108 mmol/L (ref 101–111)
Creatinine, Ser: 0.7 mg/dL (ref 0.44–1.00)
GFR calc Af Amer: >60 mL/min (ref >60)
GFR calc non Af Amer: >60 mL/min (ref >60)
Glucose, Bld: 97 mg/dL (ref 65–99)
Potassium: 3.8 mmol/L (ref 3.5–5.1)
Sodium: 141 mmol/L (ref 135–145)
Total Bilirubin: 0.7 mg/dL (ref 0.3–1.2)
Total Protein: 6.6 g/dL (ref 6.5–8.1)

## 2015-02-18 LAB — CBC WITH DIFFERENTIAL/PLATELET
Basophils Absolute: 0 10*3/uL (ref 0.0–0.1)
Basophils Relative: 0 %
Eosinophils Absolute: 0.1 10*3/uL (ref 0.0–0.7)
Eosinophils Relative: 2 %
HCT: 40.6 % (ref 36.0–46.0)
Hemoglobin: 13 g/dL (ref 12.0–15.0)
Lymphocytes Relative: 34 %
Lymphs Abs: 1.9 10*3/uL (ref 0.7–4.0)
MCH: 28.8 pg (ref 26.0–34.0)
MCHC: 32 g/dL (ref 30.0–36.0)
MCV: 90 fL (ref 78.0–100.0)
Monocytes Absolute: 0.7 10*3/uL (ref 0.1–1.0)
Monocytes Relative: 12 %
Neutro Abs: 2.9 10*3/uL (ref 1.7–7.7)
Neutrophils Relative %: 52 %
Platelets: 271 10*3/uL (ref 150–400)
RBC: 4.51 MIL/uL (ref 3.87–5.11)
RDW: 14.4 % (ref 11.5–15.5)
WBC: 5.7 10*3/uL (ref 4.0–10.5)

## 2015-02-18 LAB — URINALYSIS, ROUTINE W REFLEX MICROSCOPIC
Bilirubin Urine: NEGATIVE
Glucose, UA: NEGATIVE mg/dL
Hgb urine dipstick: NEGATIVE
Ketones, ur: NEGATIVE mg/dL
Nitrite: NEGATIVE
Protein, ur: NEGATIVE mg/dL
Specific Gravity, Urine: 1.015 (ref 1.005–1.030)
pH: 6.5 (ref 5.0–8.0)

## 2015-02-18 LAB — SURGICAL PCR SCREEN
MRSA, PCR: NEGATIVE
Staphylococcus aureus: NEGATIVE

## 2015-02-18 LAB — URINE MICROSCOPIC-ADD ON: RBC / HPF: NONE SEEN RBC/hpf (ref 0–5)

## 2015-02-18 LAB — C-REACTIVE PROTEIN: CRP: <0.5 mg/dL (ref <1.0)

## 2015-02-18 LAB — PROTIME-INR
INR: 1.13 (ref 0.00–1.49)
Prothrombin Time: 14.7 seconds (ref 11.6–15.2)

## 2015-02-18 LAB — APTT: aPTT: 31 seconds (ref 24–37)

## 2015-02-18 LAB — SEDIMENTATION RATE: Sed Rate: 25 mm/hr — ABNORMAL HIGH (ref 0–22)

## 2015-02-18 SURGERY — ANKLE FUSION
Anesthesia: General | Laterality: Right

## 2015-02-18 MED ORDER — OXYCODONE HCL 5 MG PO TABS
5.0000 mg | ORAL_TABLET | ORAL | Status: DC | PRN
  Administered 2015-02-19: 10 mg via ORAL
  Filled 2015-02-18 (×2): qty 2

## 2015-02-18 MED ORDER — ONDANSETRON HCL 4 MG/2ML IJ SOLN
INTRAMUSCULAR | Status: AC
  Filled 2015-02-18: qty 2

## 2015-02-18 MED ORDER — METOPROLOL TARTRATE 50 MG PO TABS
50.0000 mg | ORAL_TABLET | Freq: Once | ORAL | Status: AC
  Administered 2015-02-18: 50 mg via ORAL
  Filled 2015-02-18: qty 1

## 2015-02-18 MED ORDER — STERILE WATER FOR INJECTION IJ SOLN
INTRAMUSCULAR | Status: AC
  Filled 2015-02-18: qty 10

## 2015-02-18 MED ORDER — LIDOCAINE HCL (CARDIAC) 20 MG/ML IV SOLN
INTRAVENOUS | Status: AC
  Filled 2015-02-18: qty 5

## 2015-02-18 MED ORDER — PROPOFOL 10 MG/ML IV BOLUS
INTRAVENOUS | Status: DC | PRN
  Administered 2015-02-18: 30 mg via INTRAVENOUS
  Administered 2015-02-18: 150 mg via INTRAVENOUS
  Administered 2015-02-18: 30 mg via INTRAVENOUS
  Administered 2015-02-18: 50 mg via INTRAVENOUS

## 2015-02-18 MED ORDER — HYDROMORPHONE HCL 1 MG/ML IJ SOLN
0.2500 mg | INTRAMUSCULAR | Status: DC | PRN
  Administered 2015-02-18 (×4): 0.5 mg via INTRAVENOUS

## 2015-02-18 MED ORDER — PHENYLEPHRINE HCL 10 MG/ML IJ SOLN
INTRAMUSCULAR | Status: DC | PRN
  Administered 2015-02-18 (×3): 40 ug via INTRAVENOUS

## 2015-02-18 MED ORDER — FENTANYL CITRATE (PF) 100 MCG/2ML IJ SOLN
INTRAMUSCULAR | Status: DC | PRN
  Administered 2015-02-18: 50 ug via INTRAVENOUS
  Administered 2015-02-18: 25 ug via INTRAVENOUS
  Administered 2015-02-18 (×3): 50 ug via INTRAVENOUS
  Administered 2015-02-18: 25 ug via INTRAVENOUS

## 2015-02-18 MED ORDER — BUPIVACAINE-EPINEPHRINE 0.25% -1:200000 IJ SOLN
INTRAMUSCULAR | Status: DC | PRN
  Administered 2015-02-18: 10 mL

## 2015-02-18 MED ORDER — METHOCARBAMOL 500 MG PO TABS
500.0000 mg | ORAL_TABLET | Freq: Four times a day (QID) | ORAL | Status: DC | PRN
  Administered 2015-02-18 – 2015-02-20 (×4): 500 mg via ORAL
  Filled 2015-02-18 (×5): qty 1

## 2015-02-18 MED ORDER — OXYCODONE-ACETAMINOPHEN 5-325 MG PO TABS
1.0000 | ORAL_TABLET | Freq: Four times a day (QID) | ORAL | Status: DC | PRN
Start: 2015-02-18 — End: 2015-02-20
  Administered 2015-02-18 – 2015-02-20 (×6): 2 via ORAL
  Filled 2015-02-18 (×6): qty 2

## 2015-02-18 MED ORDER — SUCCINYLCHOLINE 20MG/ML (10ML) SYRINGE FOR MEDFUSION PUMP - OPTIME
INTRAMUSCULAR | Status: DC | PRN
  Administered 2015-02-18: 100 mg via INTRAVENOUS

## 2015-02-18 MED ORDER — DOCUSATE SODIUM 100 MG PO CAPS
100.0000 mg | ORAL_CAPSULE | Freq: Two times a day (BID) | ORAL | Status: DC
  Administered 2015-02-18 – 2015-02-20 (×4): 100 mg via ORAL
  Filled 2015-02-18 (×4): qty 1

## 2015-02-18 MED ORDER — GLYCOPYRROLATE 0.2 MG/ML IJ SOLN
INTRAMUSCULAR | Status: DC | PRN
  Administered 2015-02-18: 0.6 mg via INTRAVENOUS

## 2015-02-18 MED ORDER — ACETAMINOPHEN 650 MG RE SUPP: 650.0000 mg | Freq: Four times a day (QID) | RECTAL | Status: DC | PRN

## 2015-02-18 MED ORDER — PROPOFOL 10 MG/ML IV BOLUS
INTRAVENOUS | Status: AC
  Filled 2015-02-18: qty 20

## 2015-02-18 MED ORDER — ONDANSETRON HCL 4 MG PO TABS: 4.0000 mg | ORAL_TABLET | Freq: Four times a day (QID) | ORAL | Status: DC | PRN

## 2015-02-18 MED ORDER — GLYCOPYRROLATE 0.2 MG/ML IJ SOLN
INTRAMUSCULAR | Status: AC
Start: 2015-02-18 — End: 2015-02-18
  Filled 2015-02-18: qty 3

## 2015-02-18 MED ORDER — LIDOCAINE HCL (CARDIAC) 20 MG/ML IV SOLN
INTRAVENOUS | Status: DC | PRN
  Administered 2015-02-18: 50 mg via INTRAVENOUS

## 2015-02-18 MED ORDER — DEXAMETHASONE SODIUM PHOSPHATE 4 MG/ML IJ SOLN
INTRAMUSCULAR | Status: AC
  Filled 2015-02-18: qty 1

## 2015-02-18 MED ORDER — HYDROMORPHONE HCL 1 MG/ML IJ SOLN
INTRAMUSCULAR | Status: AC
  Filled 2015-02-18: qty 1

## 2015-02-18 MED ORDER — NEOSTIGMINE METHYLSULFATE 10 MG/10ML IV SOLN
INTRAVENOUS | Status: DC | PRN
  Administered 2015-02-18: 4 mg via INTRAVENOUS

## 2015-02-18 MED ORDER — DIPHENHYDRAMINE HCL 12.5 MG/5ML PO ELIX: 12.5000 mg | ORAL_SOLUTION | ORAL | Status: DC | PRN

## 2015-02-18 MED ORDER — CEFAZOLIN SODIUM 1-5 GM-% IV SOLN
1.0000 g | Freq: Four times a day (QID) | INTRAVENOUS | Status: AC
  Administered 2015-02-18 – 2015-02-19 (×3): 1 g via INTRAVENOUS
  Filled 2015-02-18 (×3): qty 50

## 2015-02-18 MED ORDER — PHENYLEPHRINE HCL 10 MG/ML IJ SOLN
10.0000 mg | INTRAVENOUS | Status: DC | PRN
  Administered 2015-02-18: 10 ug/min via INTRAVENOUS

## 2015-02-18 MED ORDER — FENTANYL CITRATE (PF) 250 MCG/5ML IJ SOLN
INTRAMUSCULAR | Status: AC
  Filled 2015-02-18: qty 5

## 2015-02-18 MED ORDER — POTASSIUM CHLORIDE IN NACL 20-0.9 MEQ/L-% IV SOLN
INTRAVENOUS | Status: DC
  Administered 2015-02-18: 21:00:00 via INTRAVENOUS
  Filled 2015-02-18: qty 1000

## 2015-02-18 MED ORDER — SODIUM CHLORIDE 0.9 % IR SOLN
Status: DC | PRN
  Administered 2015-02-18 (×3): 3000 mL

## 2015-02-18 MED ORDER — DEXAMETHASONE SODIUM PHOSPHATE 10 MG/ML IJ SOLN
INTRAMUSCULAR | Status: DC | PRN
  Administered 2015-02-18: 10 mg via INTRAVENOUS

## 2015-02-18 MED ORDER — QUETIAPINE FUMARATE 50 MG PO TABS: 50.0000 mg | ORAL_TABLET | Freq: Every day | ORAL | Status: DC

## 2015-02-18 MED ORDER — PROMETHAZINE HCL 25 MG/ML IJ SOLN: 6.2500 mg | INTRAMUSCULAR | Status: DC | PRN

## 2015-02-18 MED ORDER — METHOCARBAMOL 1000 MG/10ML IJ SOLN: 500.0000 mg | Freq: Four times a day (QID) | INTRAVENOUS | Status: DC | PRN

## 2015-02-18 MED ORDER — SUCCINYLCHOLINE CHLORIDE 20 MG/ML IJ SOLN
INTRAMUSCULAR | Status: AC
  Filled 2015-02-18: qty 1

## 2015-02-18 MED ORDER — EPHEDRINE SULFATE 50 MG/ML IJ SOLN
INTRAMUSCULAR | Status: AC
  Filled 2015-02-18: qty 1

## 2015-02-18 MED ORDER — MIDAZOLAM HCL 2 MG/2ML IJ SOLN
INTRAMUSCULAR | Status: AC
  Filled 2015-02-18: qty 2

## 2015-02-18 MED ORDER — ONDANSETRON HCL 4 MG/2ML IJ SOLN
INTRAMUSCULAR | Status: DC | PRN
  Administered 2015-02-18: 4 mg via INTRAVENOUS

## 2015-02-18 MED ORDER — SUGAMMADEX SODIUM 200 MG/2ML IV SOLN
INTRAVENOUS | Status: AC
  Filled 2015-02-18: qty 2

## 2015-02-18 MED ORDER — METOCLOPRAMIDE HCL 5 MG/ML IJ SOLN: 5.0000 mg | Freq: Three times a day (TID) | INTRAMUSCULAR | Status: DC | PRN

## 2015-02-18 MED ORDER — QUETIAPINE FUMARATE 200 MG PO TABS
200.0000 mg | ORAL_TABLET | Freq: Every day | ORAL | Status: DC
  Administered 2015-02-18 – 2015-02-19 (×2): 200 mg via ORAL
  Filled 2015-02-18 (×3): qty 1

## 2015-02-18 MED ORDER — SUGAMMADEX SODIUM 200 MG/2ML IV SOLN
INTRAVENOUS | Status: DC | PRN
  Administered 2015-02-18: 120 mg via INTRAVENOUS

## 2015-02-18 MED ORDER — ACETAMINOPHEN 325 MG PO TABS: 650.0000 mg | ORAL_TABLET | Freq: Four times a day (QID) | ORAL | Status: DC | PRN

## 2015-02-18 MED ORDER — NEOSTIGMINE METHYLSULFATE 10 MG/10ML IV SOLN
INTRAVENOUS | Status: AC
  Filled 2015-02-18: qty 3

## 2015-02-18 MED ORDER — MIDAZOLAM HCL 5 MG/5ML IJ SOLN
INTRAMUSCULAR | Status: DC | PRN
  Administered 2015-02-18: 2 mg via INTRAVENOUS

## 2015-02-18 MED ORDER — BUPIVACAINE-EPINEPHRINE (PF) 0.25% -1:200000 IJ SOLN
INTRAMUSCULAR | Status: AC
  Filled 2015-02-18: qty 30

## 2015-02-18 MED ORDER — LABETALOL HCL 5 MG/ML IV SOLN
INTRAVENOUS | Status: AC
  Filled 2015-02-18: qty 4

## 2015-02-18 MED ORDER — LACTATED RINGERS IV SOLN
INTRAVENOUS | Status: DC | PRN
  Administered 2015-02-18 (×2): via INTRAVENOUS

## 2015-02-18 MED ORDER — 0.9 % SODIUM CHLORIDE (POUR BTL) OPTIME
TOPICAL | Status: DC | PRN
  Administered 2015-02-18: 1000 mL

## 2015-02-18 MED ORDER — HYDROMORPHONE HCL 1 MG/ML IJ SOLN
1.0000 mg | INTRAMUSCULAR | Status: DC | PRN
  Administered 2015-02-18: 1 mg via INTRAVENOUS
  Filled 2015-02-18: qty 1

## 2015-02-18 MED ORDER — LABETALOL HCL 5 MG/ML IV SOLN
INTRAVENOUS | Status: DC | PRN
  Administered 2015-02-18: 5 mg via INTRAVENOUS
  Administered 2015-02-18: 10 mg via INTRAVENOUS

## 2015-02-18 MED ORDER — EPHEDRINE SULFATE 50 MG/ML IJ SOLN
INTRAMUSCULAR | Status: DC | PRN
  Administered 2015-02-18 (×3): 5 mg via INTRAVENOUS

## 2015-02-18 MED ORDER — ONDANSETRON HCL 4 MG/2ML IJ SOLN
4.0000 mg | Freq: Four times a day (QID) | INTRAMUSCULAR | Status: DC | PRN
  Administered 2015-02-18: 4 mg via INTRAVENOUS
  Filled 2015-02-18: qty 2

## 2015-02-18 MED ORDER — METOCLOPRAMIDE HCL 5 MG PO TABS: 5.0000 mg | ORAL_TABLET | Freq: Three times a day (TID) | ORAL | Status: DC | PRN

## 2015-02-18 MED ORDER — ROCURONIUM BROMIDE 100 MG/10ML IV SOLN
INTRAVENOUS | Status: DC | PRN
  Administered 2015-02-18: 10 mg via INTRAVENOUS
  Administered 2015-02-18: 25 mg via INTRAVENOUS
  Administered 2015-02-18: 10 mg via INTRAVENOUS
  Administered 2015-02-18: 5 mg via INTRAVENOUS

## 2015-02-18 MED ORDER — ROCURONIUM BROMIDE 50 MG/5ML IV SOLN
INTRAVENOUS | Status: AC
  Filled 2015-02-18: qty 2

## 2015-02-18 SURGICAL SUPPLY — 85 items
BANDAGE ACE 3X5.8 VEL STRL LF (GAUZE/BANDAGES/DRESSINGS) ×2 IMPLANT
BANDAGE ACE 6X5 VEL STRL LF (GAUZE/BANDAGES/DRESSINGS) ×2 IMPLANT
BANDAGE ELASTIC 4 VELCRO ST LF (GAUZE/BANDAGES/DRESSINGS) ×4 IMPLANT
BANDAGE ELASTIC 6 VELCRO ST LF (GAUZE/BANDAGES/DRESSINGS) ×8 IMPLANT
BANDAGE ESMARK 6X9 LF (GAUZE/BANDAGES/DRESSINGS) ×2 IMPLANT
BLADE CUDA 5.5 (BLADE) IMPLANT
BLADE GREAT WHITE 4.2 (BLADE) ×3 IMPLANT
BLADE GREAT WHITE 4.2MM (BLADE) ×1
BLADE SURG 10 STRL SS (BLADE) ×4 IMPLANT
BLADE SURG 11 STRL SS (BLADE) ×4 IMPLANT
BLADE SURG 15 STRL LF DISP TIS (BLADE) ×4 IMPLANT
BLADE SURG 15 STRL SS (BLADE) ×8
BNDG CMPR 9X6 STRL LF SNTH (GAUZE/BANDAGES/DRESSINGS) ×2
BNDG COHESIVE 4X5 TAN STRL (GAUZE/BANDAGES/DRESSINGS) IMPLANT
BNDG ESMARK 6X9 LF (GAUZE/BANDAGES/DRESSINGS) ×4
BNDG GAUZE ELAST 4 BULKY (GAUZE/BANDAGES/DRESSINGS) ×8 IMPLANT
BONE CANC CHIPS 20CC PCAN1/4 (Bone Implant) ×4 IMPLANT
BRUSH SCRUB DISP (MISCELLANEOUS) ×8 IMPLANT
CHIPS CANC BONE 20CC PCAN1/4 (Bone Implant) ×2 IMPLANT
COVER MAYO STAND STRL (DRAPES) ×4 IMPLANT
COVER SURGICAL LIGHT HANDLE (MISCELLANEOUS) ×6 IMPLANT
CUFF TOURNIQUET SINGLE 34IN LL (TOURNIQUET CUFF) ×2 IMPLANT
CUFF TOURNIQUET SINGLE 44IN (TOURNIQUET CUFF) IMPLANT
DRAPE ARTHROSCOPY W/POUCH 114 (DRAPES) ×4 IMPLANT
DRAPE C-ARM 42X72 X-RAY (DRAPES) ×4 IMPLANT
DRAPE C-ARMOR (DRAPES) ×4 IMPLANT
DRAPE INCISE IOBAN 66X45 STRL (DRAPES) ×8 IMPLANT
DRAPE ORTHO SPLIT 77X108 STRL (DRAPES) ×8
DRAPE SURG 17X23 STRL (DRAPES) ×4 IMPLANT
DRAPE SURG ORHT 6 SPLT 77X108 (DRAPES) ×2 IMPLANT
DRAPE U-SHAPE 47X51 STRL (DRAPES) ×4 IMPLANT
DRSG EMULSION OIL 3X3 NADH (GAUZE/BANDAGES/DRESSINGS) ×4 IMPLANT
DRSG MEPITEL 4X7.2 (GAUZE/BANDAGES/DRESSINGS) ×2 IMPLANT
DRSG PAD ABDOMINAL 8X10 ST (GAUZE/BANDAGES/DRESSINGS) ×6 IMPLANT
DURAPREP 26ML APPLICATOR (WOUND CARE) ×4 IMPLANT
ELECT REM PT RETURN 9FT ADLT (ELECTROSURGICAL) ×4
ELECTRODE REM PT RTRN 9FT ADLT (ELECTROSURGICAL) ×2 IMPLANT
GAUZE SPONGE 4X4 12PLY STRL (GAUZE/BANDAGES/DRESSINGS) ×6 IMPLANT
GAUZE SPONGE 4X4 16PLY XRAY LF (GAUZE/BANDAGES/DRESSINGS) ×4 IMPLANT
GLOVE BIO SURGEON STRL SZ7.5 (GLOVE) ×4 IMPLANT
GLOVE BIO SURGEON STRL SZ8 (GLOVE) ×4 IMPLANT
GLOVE BIOGEL PI IND STRL 7.5 (GLOVE) ×2 IMPLANT
GLOVE BIOGEL PI IND STRL 8 (GLOVE) ×2 IMPLANT
GLOVE BIOGEL PI INDICATOR 7.5 (GLOVE) ×2
GLOVE BIOGEL PI INDICATOR 8 (GLOVE) ×2
GOWN STRL REUS W/ TWL LRG LVL3 (GOWN DISPOSABLE) ×4 IMPLANT
GOWN STRL REUS W/ TWL XL LVL3 (GOWN DISPOSABLE) ×2 IMPLANT
GOWN STRL REUS W/TWL LRG LVL3 (GOWN DISPOSABLE) ×8
GOWN STRL REUS W/TWL XL LVL3 (GOWN DISPOSABLE) ×4
GRAFT BNE CANC CHIPS 1-8 20CC (Bone Implant) IMPLANT
KIT BASIN OR (CUSTOM PROCEDURE TRAY) ×4 IMPLANT
KIT ROOM TURNOVER OR (KITS) ×4 IMPLANT
MANIFOLD NEPTUNE II (INSTRUMENTS) ×4 IMPLANT
NS IRRIG 1000ML POUR BTL (IV SOLUTION) ×4 IMPLANT
PACK ARTHROSCOPY DSU (CUSTOM PROCEDURE TRAY) ×4 IMPLANT
PACK ORTHO EXTREMITY (CUSTOM PROCEDURE TRAY) ×4 IMPLANT
PAD ARMBOARD 7.5X6 YLW CONV (MISCELLANEOUS) ×8 IMPLANT
PAD CAST 4YDX4 CTTN HI CHSV (CAST SUPPLIES) ×4 IMPLANT
PADDING CAST COTTON 4X4 STRL (CAST SUPPLIES) ×8
PADDING CAST COTTON 6X4 STRL (CAST SUPPLIES) ×8 IMPLANT
PENCIL BUTTON HOLSTER BLD 10FT (ELECTRODE) ×4 IMPLANT
SET ARTHROSCOPY TUBING (MISCELLANEOUS) ×4
SET ARTHROSCOPY TUBING LN (MISCELLANEOUS) ×2 IMPLANT
SPONGE LAP 18X18 X RAY DECT (DISPOSABLE) ×4 IMPLANT
SPONGE SCRUB IODOPHOR (GAUZE/BANDAGES/DRESSINGS) ×8 IMPLANT
STAPLER VISISTAT 35W (STAPLE) IMPLANT
SUCTION FRAZIER TIP 10 FR DISP (SUCTIONS) ×4 IMPLANT
SUT ETHILON 3 0 PS 1 (SUTURE) ×8 IMPLANT
SUT ETHILON 4 0 PS 2 18 (SUTURE) ×4 IMPLANT
SUT VIC AB 0 CT1 27 (SUTURE) ×4
SUT VIC AB 0 CT1 27XBRD ANBCTR (SUTURE) ×2 IMPLANT
SUT VIC AB 2-0 CT1 27 (SUTURE) ×4
SUT VIC AB 2-0 CT1 TAPERPNT 27 (SUTURE) ×2 IMPLANT
SUT VIC AB 2-0 CT3 27 (SUTURE) IMPLANT
SUT VIC AB 2-0 SH 18 (SUTURE) ×4 IMPLANT
SUT VIC AB 3-0 FS2 27 (SUTURE) ×4 IMPLANT
SYR 5ML LL (SYRINGE) ×2 IMPLANT
SYSTEM CHEST DRAIN TLS 7FR (DRAIN) IMPLANT
TOWEL OR 17X24 6PK STRL BLUE (TOWEL DISPOSABLE) ×8 IMPLANT
TOWEL OR 17X26 10 PK STRL BLUE (TOWEL DISPOSABLE) ×8 IMPLANT
TUBE CONNECTING 12'X1/4 (SUCTIONS) ×1
TUBE CONNECTING 12X1/4 (SUCTIONS) ×3 IMPLANT
UNDERPAD 30X30 INCONTINENT (UNDERPADS AND DIAPERS) ×4 IMPLANT
WAND HAND CNTRL MULTIVAC 90 (MISCELLANEOUS) ×2 IMPLANT
WATER STERILE IRR 1000ML POUR (IV SOLUTION) ×4 IMPLANT

## 2015-02-18 NOTE — Op Note (Signed)
NAME:  [name], [name]                ACCOUNT NO.:  [id number]  MEDICAL RECORD NO.:  [id number]  LOCATION:  [facility]                         FACILITY:  [facility]  PHYSICIAN:  Doralee Albino. Carola Frost, M.D. DATE OF BIRTH:  05-Jul-1965  DATE OF PROCEDURE:  02/18/2015 DATE OF DISCHARGE:                              OPERATIVE REPORT   PREOPERATIVE DIAGNOSES: 1. Right calcaneus nonunion. 2. Retained antibiotic cement status post open calcaneus fracture. 3. Subtalar arthritis. 4. Right knee contracture.  POSTOPERATIVE DIAGNOSES: 1. Right calcaneus nonunion. 2. Retained antibiotic cement status post open calcaneus fracture. 3. Well-preserved subtalar joint space. 4. Right knee contracture, with extensive adhesions and synovitis. 5. 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  PROCEDURES: 1. Repair of nonunion, right calcaneus. 2. Right knee arthroscopic lysis of adhesions. 3. Right knee chondroplasty of the patellofemoral compartment and     medial compartment. 4. Removal of antibiotic cement, right calcaneus. 5. Manipulation under anesthesia, right knee, increasing ROM from 75-     105 degrees.  SURGEON:  Doralee Albino. Carola Frost, M.D.  ASSISTANT:  Mearl Latin, PA-C.  ANESTHESIA:  General.  COMPLICATIONS:  None.  TOURNIQUET:  None.  DISPOSITION:  To PACU.  CONDITION:  Stable.  BRIEF SUMMARY AND INDICATION FOR PROCEDURE:  [name] is a 50 year old female, status post grade III open right calcaneus injury in addition to a tibia fracture and comminuted into intra-articular distal femur fracture on the right, who developed posttraumatic contracture of the knee.  She appeared to develop some healing of the calcaneus around the tuberosity and posterior to the large cement spacer, but had a central defect that of course had no biologic healing from placement  of the spacer and a suspected subtalar arthritis, given her lack of subtalar motion.  We waited over 5 months from injury to allow for recovery of weightbearing and to see if we could improve the function and knee motion through conservative means, but were unable to do so.  I did discuss with her the risks and benefits of surgical treatment, including the possibility of infection, nerve injury, vessel injury, DVT, PE, heart attack, stroke, recurrent loss of motion or new problems. She acknowledged these risks and did wish to proceed.  BRIEF SUMMARY OF PROCEDURE:  The patient was taken to the operating room.  After administration of IV antibiotics, general anesthesia was induced.  Her right leg was prepped and draped in usual sterile fashion, including a prep of the iliac crest, first anticipated bone grafting from that area for repair of the calcaneus nonunion.  I began with the knee scope, where the portal sites were injected with 0.25% Marcaine with epi.  The arthroscope was advanced in the knee through the inferolateral portal and a full diagnostic arthroscopy attempted.  This was not feasible because of the extensive synovitis and adhesions. Consequently, the arthroscopic wand was advanced through the medial portal into the suprapatellar pouch and we began a stepwise resection of the capsule as well as a synovium and other scar tissue.  We proceeded again in stepwise fashion, beginning in the medial gutter, going laterally and down to the anterior aspect of the knee joint.  There were adhesions directly off the articular surface and these were carefully debrided so as to protect the articular surface at all times.  I continued to the front of the knee, where the scar and synovitis was so extensive, then, an arthroscopic biter needed to be used to debride the enormous fibrous bands in bulk and to allow for visualization and instrumentation within the medial compartment.  There were  grade II and limited grade III changes on the weightbearing surface of the medial femoral condyle identified.  We continued laterally, where the joint surface appeared well-preserved in that compartment.  I did have to switch portals as well.  I continued with the arthroscopic lysis of adhesions and removal of synovium at this time or on knee, the lateral side.  I then reintroduced the arthroscopic shaver and proceeded to perform chondroplasty of the medial femoral condyle and of the patella.  Patella was noted to have extensive grade III changes over almost its entire surface.  The cartilage was debrided back to stable short [place] that could not propagate or get caught between the joint surfaces.  Following the chondroplasty, all instruments were removed from the knee joint.  A gentle prolonged manipulation was then performed.  It should be noted that my assistant, Montez MoritaKeith Paul, PA-C was necessary to produce both the flexion and angulation required at the knee joint to facilitate full inspection of the medial and lateral compartments, and while performing the chondroplasties as well and that she also stabilized before the manipulation.  The knee began 75 degrees and with prolonged consistent application of a medium amount of force, we were able to bring the knee to 105 degrees of flexion with these and 110 with slightly more force. I did not feel any tethering along the plate laterally.  At that point, we then turned our attention distally to the ankle.  The old traumatic medial wound was reopened after bringing in C-arm to identify the correct portion of the wound required.  This was about 3 cm.  I carried dissection carefully down, plantar to the nerve and vessel and isolated the medial aspect of the large cement spacer.  Using an osteotome and mallet, we were able to break up these portions of the spacer and then removed in a piecemeal fashion.  Following removal, I took a curette  and began to scrape in and debride out the most superior aspect of the cavity, anticipating that I would be along the joint surface.  A C-arm was brought in to identify this visually and at that point, it was clear that I was below the subchondral layer of bone and that actually, the nonunion site was not contiguous with the subtalar joint and that the subtalar joint had healed and stabilized.  During live fluoro, I was able to see that the joint space itself was well- preserved.  The cavity was irrigated thoroughly and then grafted with 20 mL of allograft.  It did require the entire bulk allograft in order to fill the cavity.  Broden's view also showed visibility of the subtalar joint.  There was no movement.  There was some bridging superiorly, posteriorly, and inferiorly and the nonunion remained isolated to the central aspect of the bone.  We then closed in layered fashion using 2-0 Vicryl and 3-0 nylon.  Gently compressive dressing was applied from the foot to  the thigh.  Montez Morita, PA-C assisted me throughout and was necessary for all portions of the procedure including with retraction distally with protection of the posterior tibial artery and plantar nerve and she also assisted with closure.  PROGNOSIS:  The patient will be partial weightbearing on the right lower extremity with unrestricted range of motion of the knee, ankle, and foot.  We will plan to see her back in the office after discharge from the hospital in 2 weeks for removal of sutures.  She will have aggressive physical therapy for her knee.  One should not require formal DVT prophylaxis.  That is indication thank.     Doralee Albino. Carola Frost, M.D.     [facility]/MEDQ  D:  02/18/2015  T:  02/18/2015  Job:  [id number]

## 2015-02-18 NOTE — Progress Notes (Signed)
Orthopedic Tech Progress Note Patient Details:  [name] [birth year] [id number]  Ortho Devices Type of Ortho Device: CAM walker Ortho Device/Splint Location: rle   Trinna PostMartinez, Aarica Wax J 02/18/2015, 8:44 PM

## 2015-02-18 NOTE — Anesthesia Preprocedure Evaluation (Signed)
Anesthesia Evaluation  Patient identified by MRN, date of birth, ID band Patient awake    Reviewed: Allergy & Precautions, NPO status , Patient's Chart, lab work & pertinent test results  Airway Mallampati: II  TM Distance: >3 FB Neck ROM: Full    Dental no notable dental hx.    Pulmonary sleep apnea , former smoker,    Pulmonary exam normal breath sounds clear to auscultation       Cardiovascular hypertension, + CAD, + Past MI and + Cardiac Stents  negative cardio ROS Normal cardiovascular exam Rhythm:Regular Rate:Normal     Neuro/Psych Bipolar Disorder negative neurological ROS     GI/Hepatic Neg liver ROS, GERD  ,  Endo/Other  negative endocrine ROS  Renal/GU negative Renal ROS  negative genitourinary   Musculoskeletal negative musculoskeletal ROS (+)   Abdominal   Peds negative pediatric ROS (+)  Hematology negative hematology ROS (+)   Anesthesia Other Findings   Reproductive/Obstetrics negative OB ROS                             Anesthesia Physical Anesthesia Plan  ASA: III  Anesthesia Plan: General   Post-op Pain Management:    Induction: Intravenous  Airway Management Planned: Oral ETT and Video Laryngoscope Planned  Additional Equipment:   Intra-op Plan:   Post-operative Plan: Extubation in OR  Informed Consent: I have reviewed the patients History and Physical, chart, labs and discussed the procedure including the risks, benefits and alternatives for the proposed anesthesia with the patient or authorized representative who has indicated his/her understanding and acceptance.   Dental advisory given  Plan Discussed with: CRNA and Surgeon  Anesthesia Plan Comments:         Anesthesia Quick Evaluation

## 2015-02-18 NOTE — Brief Op Note (Signed)
02/18/2015  11:20 AM  PATIENT:  Dineen KidMonica Mattice  50 y.o. female  PRE-OPERATIVE DIAGNOSIS:   1. Right calcaneus nonunion 2. Retained antibiotic cement s/p open calcaneus fracture 3. Subtalar arthritis 4. Right knee contracture  POST-OPERATIVE DIAGNOSIS:  1. Right calcaneus nonunion 2. Retained antibiotic cement s/p open calcaneus fracture 3. Well preserved subtalar joint space 4. Right knee contracture with extensive adhesions 5. 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 REPAIR OF CALCANEUS NONUNION , RIGHT  2. RIGHT KNEE ARTHROSCOPIC LYSIS OF ADHESIONS 3. RIGHT KNEE CHONDROPLASTY TWO COMPARTMENTS 4. REMOVAL OF ANTIBIOTIC CEMENT RIGHT CALCANEUS 5. MANIPULATION UNDER ANESTHESIA, RIGHT KNEE (INCREASING ROM FROM 75 TO 105 DEGREES)  SURGEON:  Surgeon(s) and Role:    * Myrene GalasMichael Irisha Grandmaison, MD - Primary  PHYSICIAN ASSISTANT: Montez MoritaKeith Paul, PA-C  ANESTHESIA:   general  I/O:  Total I/O In: 1000 [I.V.:1000] Out: -   SPECIMEN:  No Specimen  TOURNIQUET:  * No tourniquets in log *  DICTATION: .Other Dictation: Dictation Number [id number]

## 2015-02-18 NOTE — Anesthesia Postprocedure Evaluation (Signed)
Anesthesia Post Note  Patient: [name]  Procedure(s) Performed: Procedure(s) (LRB): RIGHT KNEE SUBTALAR FUSION (Right) ARTHROSCOPY RIGHT KNEE WITH MANIPULATION (Right) HARVEST ILIAC BONE GRAFT  (N/A)  Patient location during evaluation: PACU Anesthesia Type: General Level of consciousness: awake and alert Pain management: pain level controlled Vital Signs Assessment: post-procedure vital signs reviewed and stable Respiratory status: spontaneous breathing, nonlabored ventilation, respiratory function stable and patient connected to nasal cannula oxygen Cardiovascular status: blood pressure returned to baseline and stable Postop Assessment: no signs of nausea or vomiting Anesthetic complications: no    Last Vitals:  Filed Vitals:   02/18/15 1245 02/18/15 1246  BP: 105/67   Pulse: 66   Temp:  36.3 C  Resp: 15     Last Pain:  Filed Vitals:   02/18/15 1333  PainSc: 10-Worst pain ever                 [name] [name]

## 2015-02-18 NOTE — Anesthesia Procedure Notes (Signed)
Procedure Name: Intubation Date/Time: 02/18/2015 8:28 AM Performed by: Wilder GladeWINN, Bette Brienza G Pre-anesthesia Checklist: Patient identified, Emergency Drugs available, Suction available, Patient being monitored and Timeout performed Patient Re-evaluated:Patient Re-evaluated prior to inductionOxygen Delivery Method: Circle system utilized Preoxygenation: Pre-oxygenation with 100% oxygen Intubation Type: IV induction Ventilation: Mask ventilation without difficulty Laryngoscope Size: Mac, 4 and Glidescope Grade View: Grade I Tube type: Oral Tube size: 7.0 mm Number of attempts: 2 (1st time by CRNA with MD watching glidescope easy MD reposition tube before ventialation - negative ETCO2, dvl second time within seconds successful by MD vss pox 100% throughout) Placement Confirmation: ETT inserted through vocal cords under direct vision,  positive ETCO2 and breath sounds checked- equal and bilateral Secured at: 22 cm Tube secured with: Tape Dental Injury: Teeth and Oropharynx as per pre-operative assessment

## 2015-02-18 NOTE — Transfer of Care (Signed)
Immediate Anesthesia Transfer of Care Note  Patient: [name]  Procedure(s) Performed: Procedure(s): RIGHT KNEE SUBTALAR FUSION (Right) ARTHROSCOPY RIGHT KNEE WITH MANIPULATION (Right) HARVEST ILIAC BONE GRAFT  (N/A)  Patient Location: PACU  Anesthesia Type:General  Level of Consciousness: awake, alert , oriented and patient cooperative  Airway & Oxygen Therapy: Patient Spontanous Breathing and Patient connected to nasal cannula oxygen  Post-op Assessment: Report given to RN and Post -op Vital signs reviewed and stable  Post vital signs: Reviewed and stable  Last Vitals:  Filed Vitals:   02/18/15 0626 02/18/15 1130  BP: 197/105   Pulse: 74   Temp: 36.9 C 36.4 C  Resp: 18     Complications: No apparent anesthesia complications

## 2015-02-18 NOTE — Progress Notes (Signed)
Pt arrived to unit stating pain was a  10/10. Pt was alert and easily aroused but requesting pain medication. RN gave  PRN pain medication at that time. Pt has been resting comfortably, sleeping, but easily aroused.

## 2015-02-19 ENCOUNTER — Encounter (HOSPITAL_COMMUNITY): Payer: Self-pay | Admitting: Orthopedic Surgery

## 2015-02-19 DIAGNOSIS — S92001A Unspecified fracture of right calcaneus, initial encounter for closed fracture: Secondary | ICD-10-CM | POA: Diagnosis not present

## 2015-02-19 LAB — VITAMIN D 25 HYDROXY (VIT D DEFICIENCY, FRACTURES): Vit D, 25-Hydroxy: 27.3 ng/mL — ABNORMAL LOW (ref 30.0–100.0)

## 2015-02-19 MED ORDER — VITAMIN D 1000 UNITS PO TABS
2000.0000 [IU] | ORAL_TABLET | Freq: Every day | ORAL | Status: DC
  Administered 2015-02-19 – 2015-02-20 (×2): 2000 [IU] via ORAL
  Filled 2015-02-19 (×2): qty 2

## 2015-02-19 MED ORDER — DIVALPROEX SODIUM ER 500 MG PO TB24
1000.0000 mg | ORAL_TABLET | Freq: Two times a day (BID) | ORAL | Status: DC
  Administered 2015-02-19 – 2015-02-20 (×3): 1000 mg via ORAL
  Filled 2015-02-19 (×4): qty 2

## 2015-02-19 MED ORDER — METOPROLOL TARTRATE 50 MG PO TABS
50.0000 mg | ORAL_TABLET | Freq: Two times a day (BID) | ORAL | Status: DC
  Administered 2015-02-19 – 2015-02-20 (×3): 50 mg via ORAL
  Filled 2015-02-19 (×3): qty 1

## 2015-02-19 MED ORDER — ESCITALOPRAM OXALATE 10 MG PO TABS
20.0000 mg | ORAL_TABLET | Freq: Every day | ORAL | Status: DC
  Administered 2015-02-19 – 2015-02-20 (×2): 20 mg via ORAL
  Filled 2015-02-19 (×3): qty 2

## 2015-02-19 MED ORDER — ADULT MULTIVITAMIN W/MINERALS CH
1.0000 | ORAL_TABLET | Freq: Every day | ORAL | Status: DC
  Administered 2015-02-19 – 2015-02-20 (×2): 1 via ORAL
  Filled 2015-02-19 (×2): qty 1

## 2015-02-19 MED ORDER — VITAMIN D (ERGOCALCIFEROL) 1.25 MG (50000 UNIT) PO CAPS
50000.0000 [IU] | ORAL_CAPSULE | ORAL | Status: DC
  Administered 2015-02-19: 50000 [IU] via ORAL
  Filled 2015-02-19: qty 1

## 2015-02-19 NOTE — Progress Notes (Signed)
Orthopaedic Trauma Service Progress Note  Subjective  Doing ok Has not been out of bed yet Pain controlled  No new issues    ROS As above  Objective    BP 114/58 mmHg  Pulse 75  Temp(Src) 97.5 F (36.4 C) (Oral)  Resp 16  Ht '5\' 1"'$  (1.549 m)  Wt 63.504 kg (140 lb)  BMI 26.47 kg/m2  SpO2 100%  Intake/Output      01/10 0701 - 01/11 0700 01/11 0701 - 01/12 0700   P.O. 280    I.V. (mL/kg) 2353.3 (37.1)    IV Piggyback 100    Total Intake(mL/kg) 2733.3 (43)    Blood 50    Total Output 50     Net +2683.3            Labs Results for Janet, [name] (MRN [id number]) as of 02/19/2015 09:32  Ref. Range 02/18/2015 06:46  Sodium Latest Ref Range: 135-145 mmol/L 141  Potassium Latest Ref Range: 3.5-5.1 mmol/L 3.8  Chloride Latest Ref Range: 101-111 mmol/L 108  CO2 Latest Ref Range: 22-32 mmol/L 25  BUN Latest Ref Range: 6-20 mg/dL 5 (L)  Creatinine Latest Ref Range: 0.44-1.00 mg/dL 0.70  Calcium Latest Ref Range: 8.9-10.3 mg/dL 8.9  EGFR (Non-African Amer.) Latest Ref Range: >60 mL/min >60  EGFR (African American) Latest Ref Range: >60 mL/min >60  Glucose Latest Ref Range: 65-99 mg/dL 97  Anion gap Latest Ref Range: 5-15  8  Alkaline Phosphatase Latest Ref Range: 38-126 U/L 71  Albumin Latest Ref Range: 3.5-5.0 g/dL 3.3 (L)  AST Latest Ref Range: 15-41 U/L 17  ALT Latest Ref Range: 14-54 U/L 8 (L)  Total Protein Latest Ref Range: 6.5-8.1 g/dL 6.6  Total Bilirubin Latest Ref Range: 0.3-1.2 mg/dL 0.7  CRP Latest Ref Range: <1.0 mg/dL <0.5  Vit D, 25-Hydroxy Latest Ref Range: 30.0-100.0 ng/mL 27.3 (L)  WBC Latest Ref Range: 4.0-10.5 K/uL 5.7  RBC Latest Ref Range: 3.87-5.11 MIL/uL 4.51  Hemoglobin Latest Ref Range: 12.0-15.0 g/dL 13.0  HCT Latest Ref Range: 36.0-46.0 % 40.6  MCV Latest Ref Range: 78.0-100.0 fL 90.0  MCH Latest Ref Range: 26.0-34.0 pg 28.8  MCHC Latest Ref Range: 30.0-36.0 g/dL 32.0  RDW Latest Ref Range: 11.5-15.5 % 14.4  Platelets Latest Ref Range:  150-400 K/uL 271  Neutrophils Latest Units: % 52  Lymphocytes Latest Units: % 34  Monocytes Relative Latest Units: % 12  Eosinophil Latest Units: % 2  Basophil Latest Units: % 0  NEUT# Latest Ref Range: 1.7-7.7 K/uL 2.9  Lymphocyte # Latest Ref Range: 0.7-4.0 K/uL 1.9  Monocyte # Latest Ref Range: 0.1-1.0 K/uL 0.7  Eosinophils Absolute Latest Ref Range: 0.0-0.7 K/uL 0.1  Basophils Absolute Latest Ref Range: 0.0-0.1 K/uL 0.0  Sed Rate Latest Ref Range: 0-22 mm/hr 25 (H)  Prothrombin Time Latest Ref Range: 11.6-15.2 seconds 14.7  INR Latest Ref Range: 0.00-1.49  1.13  APTT Latest Ref Range: 24-37 seconds 31   Urine culture pending  Exam  Gen: resting comfortably in bed, NAD Lungs: breathing unlabored  Cardiac: regular  Ext:       Right lower extremity   Dressing c/d/i  Ext warm  + DP pulse   No DCT  Compartments soft, NT  Distal motor and sensory functions intact   Assessment and Plan   POD/HD#: 1  50 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 Pain management:  Continue with current regimen  3. Hemodynamics  Stable  Monitor  4. Medical issues   Home meds  5. DVT/PE prophylaxis:  No pharmacologics needed  Pt WBAT, ambulatory   6. ID:   periop abx   7. Metabolic Bone Disease:  Still with vitamin D insufficiency   Continue with supplementation   8. Activity:  WBAT R leg  9. FEN/GI prophylaxis/Foley/Lines:  Diet as tolerated   protonix  10. Dispo:  Therapies  Plan for dc home tomorrow     Jari Pigg, PA-C [facility] [phone] (O) 02/19/2015 9:32 AM

## 2015-02-19 NOTE — Progress Notes (Signed)
Orthopedic Tech Progress Note Patient Details:  Janet [name] December 09, 1965 [id number] On cpm at 1850 Patient ID: Janet [name], female   DOB: December 09, 1965, 50 y.o.   MRN: [id number]   [name], [place] Faiola Craig 02/19/2015, 6:47 PM

## 2015-02-19 NOTE — Progress Notes (Signed)
Orthopedic Tech Progress Note Patient Details:  [name] [birth year] [id number]  CPM Right Knee CPM Right Knee: On Right Knee Flexion (Degrees): 90 Right Knee Extension (Degrees): 0 Additional Comments: trapeze bar patient helper   [name], [name] 02/19/2015, 11:44 AM
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
## 2015-02-20 DIAGNOSIS — S92001A Unspecified fracture of right calcaneus, initial encounter for closed fracture: Secondary | ICD-10-CM | POA: Diagnosis not present

## 2015-02-20 DIAGNOSIS — N390 Urinary tract infection, site not specified: Secondary | ICD-10-CM | POA: Diagnosis present

## 2015-02-20 HISTORY — DX: Urinary tract infection, site not specified: N39.0

## 2015-02-20 LAB — CBC
HCT: 34.9 % — ABNORMAL LOW (ref 36.0–46.0)
Hemoglobin: 11.1 g/dL — ABNORMAL LOW (ref 12.0–15.0)
MCH: 29.4 pg (ref 26.0–34.0)
MCHC: 31.8 g/dL (ref 30.0–36.0)
MCV: 92.3 fL (ref 78.0–100.0)
Platelets: 236 10*3/uL (ref 150–400)
RBC: 3.78 MIL/uL — ABNORMAL LOW (ref 3.87–5.11)
RDW: 15.3 % (ref 11.5–15.5)
WBC: 5.7 10*3/uL (ref 4.0–10.5)

## 2015-02-20 LAB — URINE CULTURE: Culture: >=100000

## 2015-02-20 MED ORDER — CHOLECALCIFEROL 50 MCG (2000 UT) PO TABS: 2000.0000 [IU] | ORAL_TABLET | Freq: Every day | ORAL | Status: DC

## 2015-02-20 MED ORDER — NITROFURANTOIN MONOHYD MACRO 100 MG PO CAPS
100.0000 mg | ORAL_CAPSULE | Freq: Two times a day (BID) | ORAL | Status: DC
  Filled 2015-02-20 (×2): qty 1

## 2015-02-20 MED ORDER — METHOCARBAMOL 500 MG PO TABS: 500.0000 mg | ORAL_TABLET | Freq: Four times a day (QID) | ORAL | Status: DC | PRN

## 2015-02-20 MED ORDER — DOCUSATE SODIUM 100 MG PO CAPS: 100.0000 mg | ORAL_CAPSULE | Freq: Two times a day (BID) | ORAL | Status: DC

## 2015-02-20 MED ORDER — VITAMIN D (ERGOCALCIFEROL) 1.25 MG (50000 UNIT) PO CAPS: 50000.0000 [IU] | ORAL_CAPSULE | ORAL | Status: DC

## 2015-02-20 MED ORDER — NITROFURANTOIN MONOHYD MACRO 100 MG PO CAPS: 100.0000 mg | ORAL_CAPSULE | Freq: Two times a day (BID) | ORAL | Status: DC

## 2015-02-20 MED ORDER — OXYCODONE-ACETAMINOPHEN 5-325 MG PO TABS: 1.0000 | ORAL_TABLET | Freq: Four times a day (QID) | ORAL | Status: DC | PRN

## 2015-02-20 NOTE — Progress Notes (Signed)
RN went over discharge instructions with patient, including changing her dressing. Pt wasn't aware that she would be changing her own dressing. Called PA to verify that she was to change dressing once discharged. Pt given gauze, kerlix, ace wrap to change dressing. Pt verified that she understood how to change dressing. Prescriptions were given to patient

## 2015-02-20 NOTE — Discharge Instructions (Signed)
Orthopaedic Trauma Service Discharge Instructions   General Discharge Instructions  WEIGHT BEARING STATUS: weightbear as tolerated right leg with cam boot  RANGE OF MOTION/ACTIVITY: range of motion as tolerated R knee and ankle  Wound Care: dressing changes starting on 12/23/2014. See instructions below   Discharge Wound Care Instructions  Do NOT apply any ointments, solutions or lotions to pin sites or surgical wounds.  These prevent needed drainage and even though solutions like hydrogen peroxide kill bacteria, they also damage cells lining the pin sites that help fight infection.  Applying lotions or ointments can keep the wounds moist and can cause them to breakdown and open up as well. This can increase the risk for infection. When in doubt call the office.  Surgical incisions should be dressed daily.  If any drainage is noted, use one layer of adaptic, then gauze, Kerlix, and an ace wrap.  Once the incision is completely dry and without drainage, it may be left open to air out.  Showering may begin 36-48 hours later.  Cleaning gently with soap and water.  Traumatic wounds should be dressed daily as well.    One layer of adaptic, gauze, Kerlix, then ace wrap.  The adaptic can be discontinued once the draining has ceased    If you have a wet to dry dressing: wet the gauze with saline the squeeze as much saline out so the gauze is moist (not soaking wet), place moistened gauze over wound, then place a dry gauze over the moist one, followed by Kerlix wrap, then ace wrap.  PAIN MEDICATION USE AND EXPECTATIONS  You have likely been given narcotic medications to help control your pain.  After a traumatic event that results in an fracture (broken bone) with or without surgery, it is ok to use narcotic pain medications to help control one's pain.  We understand that everyone responds to pain differently and each individual patient will be evaluated on a regular basis for the continued need  for narcotic medications. Ideally, narcotic medication use should last no more than 6-8 weeks (coinciding with fracture healing).   As a patient it is your responsibility as well to monitor narcotic medication use and report the amount and frequency you use these medications when you come to your office visit.   We would also advise that if you are using narcotic medications, you should take a dose prior to therapy to maximize you participation.  IF YOU ARE ON NARCOTIC MEDICATIONS IT IS NOT PERMISSIBLE TO OPERATE A MOTOR VEHICLE (MOTORCYCLE/CAR/TRUCK/MOPED) OR HEAVY MACHINERY DO NOT MIX NARCOTICS WITH OTHER CNS (CENTRAL NERVOUS SYSTEM) DEPRESSANTS SUCH AS ALCOHOL  Diet: as you were eating previously.  Can use over the counter stool softeners and bowel preparations, such as Miralax, to help with bowel movements.  Narcotics can be constipating.  Be sure to drink plenty of fluids    STOP SMOKING OR USING NICOTINE PRODUCTS!!!!  As discussed nicotine severely impairs your body's ability to heal surgical and traumatic wounds but also impairs bone healing.  Wounds and bone heal by forming microscopic blood vessels (angiogenesis) and nicotine is a vasoconstrictor (essentially, shrinks blood vessels).  Therefore, if vasoconstriction occurs to these microscopic blood vessels they essentially disappear and are unable to deliver necessary nutrients to the healing tissue.  This is one modifiable factor that you can do to dramatically increase your chances of healing your injury.    (This means no smoking, no nicotine gum, patches, etc)  DO NOT USE NONSTEROIDAL ANTI-INFLAMMATORY DRUGS (  NSAID'S)  Using products such as Advil (ibuprofen), Aleve (naproxen), Motrin (ibuprofen) for additional pain control during fracture healing can delay and/or prevent the healing response.  If you would like to take over the counter (OTC) medication, Tylenol (acetaminophen) is ok.  However, some narcotic medications that are given  for pain control contain acetaminophen as well. Therefore, you should not exceed more than 4000 mg of tylenol in a day if you do not have liver disease.  Also note that there are may OTC medicines, such as cold medicines and allergy medicines that my contain tylenol as well.  If you have any questions about medications and/or interactions please ask your doctor/PA or your pharmacist.      ICE AND ELEVATE INJURED/OPERATIVE EXTREMITY  Using ice and elevating the injured extremity above your heart can help with swelling and pain control.  Icing in a pulsatile fashion, such as 20 minutes on and 20 minutes off, can be followed.    Do not place ice directly on skin. Make sure there is a barrier between to skin and the ice pack.    Using frozen items such as frozen peas works well as the conform nicely to the are that needs to be iced.  USE AN ACE WRAP OR TED HOSE FOR SWELLING CONTROL  In addition to icing and elevation, Ace wraps or TED hose are used to help limit and resolve swelling.  It is recommended to use Ace wraps or TED hose until you are informed to stop.    When using Ace Wraps start the wrapping distally (farthest away from the body) and wrap proximally (closer to the body)   Example: If you had surgery on your leg or thing and you do not have a splint on, start the ace wrap at the toes and work your way up to the thigh        If you had surgery on your upper extremity and do not have a splint on, start the ace wrap at your fingers and work your way up to the upper arm  IF YOU ARE IN A SPLINT OR CAST DO NOT REMOVE IT FOR ANY REASON   If your splint gets wet for any reason please contact the office immediately. You may shower in your splint or cast as long as you keep it dry.  This can be done by wrapping in a cast cover or garbage back (or similar)  Do Not stick any thing down your splint or cast such as pencils, money, or hangers to try and scratch yourself with.  If you feel itchy take  benadryl as prescribed on the bottle for itching  IF YOU ARE IN A CAM BOOT (BLACK BOOT)  You may remove boot periodically. Perform daily dressing changes as noted below.  Wash the liner of the boot regularly and wear a sock when wearing the boot. It is recommended that you sleep in the boot until told otherwise  CALL THE OFFICE WITH ANY QUESTIONS OR CONCERTS: [phone]
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
## 2015-02-20 NOTE — Progress Notes (Signed)
[facility] Progress Note  Subjective  Doing well  Ready to go home Pain minimal (3/10)  Working with therapies right now   ROS As above   Objective   BP 123/74 mmHg  Pulse 72  Temp(Src) 98 F (36.7 C) (Oral)  Resp 16  Ht 5\' 1"  (1.549 m)  Wt 63.504 kg (140 lb)  BMI 26.47 kg/m2  SpO2 100%  Intake/Output      01/11 0701 - 01/12 0700 01/12 0701 - 01/13 0700   P.O. 880    I.V. (mL/kg)     IV Piggyback     Total Intake(mL/kg) 880 (13.9)    Blood     Total Output       Net +880          Urine Occurrence 7 x      Labs  Results for [name], [name] (MRN [id number]) as of 02/20/2015 12:27  Ref. Range 02/20/2015 05:38  WBC Latest Ref Range: 4.0-10.5 K/uL 5.7  RBC Latest Ref Range: 3.87-5.11 MIL/uL 3.78 (L)  Hemoglobin Latest Ref Range: 12.0-15.0 g/dL 40.9 (L)  HCT Latest Ref Range: 36.0-46.0 % 34.9 (L)  MCV Latest Ref Range: 78.0-100.0 fL 92.3  MCH Latest Ref Range: 26.0-34.0 pg 29.4  MCHC Latest Ref Range: 30.0-36.0 g/dL 81.1  RDW Latest Ref Range: 11.5-15.5 % 15.3  Platelets Latest Ref Range: 150-400 K/uL 236   Urine culture  Status: Final result     Visible to patient:  Not Released     Next appt: None              2d ago     Specimen Description URINE, CLEAN CATCH    Special Requests NONE    Culture >=100,000 COLONIES/mL ESCHERICHIA COLI    Report Status 02/20/2015 FINAL    Organism ID, Bacteria ESCHERICHIA COLI    Resulting Agency [facility]      Culture & Susceptibility         ESCHERICHIA COLI       Antibiotic Sensitivity Microscan Status      AMPICILLIN Resistant >=32 RESISTANT Final      Method: MIC      AMPICILLIN/SULBACTAM Resistant >=32 RESISTANT Final      Method: MIC      CEFAZOLIN Sensitive <=4 SENSITIVE Final      Method: MIC      CEFTRIAXONE Sensitive <=1 SENSITIVE Final      Method: MIC      CIPROFLOXACIN Resistant >=4 RESISTANT Final      Method: MIC      GENTAMICIN Sensitive <=1 SENSITIVE Final      Method: MIC       IMIPENEM Sensitive <=0.25 SENSITIVE Final      Method: MIC      NITROFURANTOIN Sensitive <=16 SENSITIVE Final      Method: MIC      PIP/TAZO Sensitive <=4 SENSITIVE Final      Method: MIC      TRIMETH/SULFA Sensitive <=20 SENSITIVE Final            Exam  Gen: NAD, walking with walker  Ext:       Right Lower Extremity   Right lower extremity               Dressing c/d/i             Ext warm             + DP pulse  No DCT             Compartments soft, NT             Distal motor and sensory functions intact    Assessment and Plan   50 y/o female with R calcaneus nonunion, R knee contracture     1. 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 Pain management:             Continue with current regimen  3. Hemodynamics             Stable             Monitor  4. Medical issues               Home meds   UTI   Present prior to admission   Cultures finalized    Will start macrobid x 7 dats   5. DVT/PE prophylaxis:             No pharmacologics needed             Pt WBAT, ambulatory   6. ID:               periop abx   7. Metabolic Bone Disease:             Still with vitamin D insufficiency               Continue with supplementation   8. Activity:             WBAT R leg  9. FEN/GI prophylaxis/Foley/Lines:             Diet as tolerated               protonix  10. Dispo:  Dc home today   Follow up with ortho in 2 weeks      Janet LatinKeith W. [name], PA-C [facility] [phone]  (O) 02/20/2015 12:26 PM
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
## 2015-02-20 NOTE — Care Management Note (Signed)
Case Management Note  Patient Details  Name: Dineen KidMonica Sylla MRN: [id number] Date of Birth: 05/21/1965  Subjective/Objective:     50 yr old female s/p :             1. Repair of nonunion, right calcaneus. 2. Right knee arthroscopic lysis of adhesions. 3. Right knee chondroplasty of the patellofemoral compartment and  medial compartment. 4. Removal of antibiotic cement, right calcaneus. 5. Manipulation under anesthesia, right knee, increasing ROM from 75-  105 degrees.   Action/Plan:  No home health needed, case manager arranged for CPM to be delivered to patient's home. Patient will be staying with her mom at: [address], [place], [place] [id number]    Expected Discharge Date:     02/20/15             Expected Discharge Plan:   Home/self Care  In-House Referral:     Discharge planning Services  CM Consult  Post Acute Care Choice:  Durable Medical Equipment Choice offered to:     DME Arranged:  CPM DME Agency:  [employer]  HH Arranged:  NA HH Agency:  NA  Status of Service:   completed  Medicare Important Message Given:    Date Medicare IM Given:    Medicare IM give by:    Date Additional Medicare IM Given:    Additional Medicare Important Message give by:     If discussed at Long Length of Stay Meetings, dates discussed:    Additional Comments:  Durenda GuthrieBrady, [place] Blucher Naomi, RN 02/20/2015, 2:19 PM

## 2015-03-05 ENCOUNTER — Telehealth: Payer: Self-pay | Admitting: Interventional Cardiology

## 2015-03-05 ENCOUNTER — Other Ambulatory Visit: Payer: Self-pay | Admitting: *Deleted

## 2015-03-05 MED ORDER — METOPROLOL TARTRATE 50 MG PO TABS: 50.0000 mg | ORAL_TABLET | Freq: Two times a day (BID) | ORAL | Status: DC

## 2015-03-05 NOTE — Telephone Encounter (Signed)
New message   *STAT* If patient is at the pharmacy, call can be transferred to refill team.   1. Which medications need to be refilled? (please list name of each medication and dose if known) metoprolol (LOPRESSOR) 50 MG tablet   2. Which pharmacy/location (including street and city if local pharmacy) is medication to be sent to? [facility] in [place]   3. Do they need a 30 day or 90 day supply? 30 day supply   Comments: pt called states that she is completely out. Recovering from Surgery so it is important that she gets this refill.   Appt with Scott weaver on 02/10 @ 9:50a.
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
## 2015-03-21 ENCOUNTER — Encounter: Payer: Self-pay | Admitting: Physician Assistant

## 2015-03-21 ENCOUNTER — Ambulatory Visit (INDEPENDENT_AMBULATORY_CARE_PROVIDER_SITE_OTHER): Payer: Self-pay | Admitting: Physician Assistant

## 2015-03-21 VITALS — BP 150/96 | HR 82 | Ht 61.0 in | Wt 149.1 lb

## 2015-03-21 DIAGNOSIS — E785 Hyperlipidemia, unspecified: Secondary | ICD-10-CM

## 2015-03-21 DIAGNOSIS — Z720 Tobacco use: Secondary | ICD-10-CM

## 2015-03-21 DIAGNOSIS — G4733 Obstructive sleep apnea (adult) (pediatric): Secondary | ICD-10-CM

## 2015-03-21 DIAGNOSIS — I10 Essential (primary) hypertension: Secondary | ICD-10-CM

## 2015-03-21 DIAGNOSIS — I2510 Atherosclerotic heart disease of native coronary artery without angina pectoris: Secondary | ICD-10-CM

## 2015-03-21 MED ORDER — ATORVASTATIN CALCIUM 40 MG PO TABS: 40.0000 mg | ORAL_TABLET | Freq: Every day | ORAL | Status: DC

## 2015-03-21 MED ORDER — TRIAMTERENE-HCTZ 37.5-25 MG PO TABS: 0.5000 | ORAL_TABLET | Freq: Every day | ORAL | Status: DC

## 2015-03-21 MED ORDER — METOPROLOL TARTRATE 50 MG PO TABS: 50.0000 mg | ORAL_TABLET | Freq: Two times a day (BID) | ORAL | Status: DC

## 2015-03-21 MED ORDER — ASPIRIN EC 81 MG PO TBEC
81.0000 mg | DELAYED_RELEASE_TABLET | Freq: Every day | ORAL | Status: DC
Start: 2015-03-21 — End: 2015-11-03

## 2015-03-21 NOTE — Patient Instructions (Addendum)
Medication Instructions:  1. START MAXZIDE 37.5/25 MG TAKE 1/2 TABLET DAILY 2. START ATORVASTATIN 40 MG AT BEDTIME 3. START ASPIRIN 81 MG DAILY 4. A REFILL FOR METOPROLOL HAS BEEN SENT IN   Labwork: 1. BMET IN 1 WEEK 2/17 2. FASTING LIPID AND LIVER PANEL TO BE DONE IN 3 MONTHS 06/13/15  Testing/Procedures: NONE  Follow-Up: SCOTT WEAVER, PAC 3 MONTHS  Any Other Special Instructions Will Be Listed Below (If Applicable). MONITOR BP DAILY OVER THE NEXT 2 WEEKS AND CALL BACK WITH READINGS AFTER THE 2 WEEKS [phone]  If you need a refill on your cardiac medications before your next appointment, please call your pharmacy.

## 2015-03-28 ENCOUNTER — Other Ambulatory Visit (INDEPENDENT_AMBULATORY_CARE_PROVIDER_SITE_OTHER): Payer: Medicaid Other | Admitting: *Deleted

## 2015-03-28 DIAGNOSIS — I10 Essential (primary) hypertension: Secondary | ICD-10-CM

## 2015-03-28 DIAGNOSIS — E785 Hyperlipidemia, unspecified: Secondary | ICD-10-CM

## 2015-03-28 LAB — HEPATIC FUNCTION PANEL
ALT: 9 U/L (ref 6–29)
AST: 14 U/L (ref 10–35)
Albumin: 3.7 g/dL (ref 3.6–5.1)
Alkaline Phosphatase: 68 U/L (ref 33–115)
Bilirubin, Direct: 0.1 mg/dL (ref <=0.2)
Indirect Bilirubin: 0.1 mg/dL — ABNORMAL LOW (ref 0.2–1.2)
Total Bilirubin: 0.2 mg/dL (ref 0.2–1.2)
Total Protein: 6.8 g/dL (ref 6.1–8.1)

## 2015-03-28 LAB — LIPID PANEL
Cholesterol: 128 mg/dL (ref 125–200)
HDL: 54 mg/dL (ref >=46)
LDL Cholesterol: 63 mg/dL (ref <130)
Total CHOL/HDL Ratio: 2.4 Ratio (ref <=5.0)
Triglycerides: 53 mg/dL (ref <150)
VLDL: 11 mg/dL (ref <30)

## 2015-03-28 LAB — BASIC METABOLIC PANEL
BUN: 10 mg/dL (ref 7–25)
CO2: 19 mmol/L — ABNORMAL LOW (ref 20–31)
Calcium: 9 mg/dL (ref 8.6–10.2)
Chloride: 103 mmol/L (ref 98–110)
Creat: 0.72 mg/dL (ref 0.50–1.10)
Glucose, Bld: 112 mg/dL — ABNORMAL HIGH (ref 65–99)
Potassium: 3.6 mmol/L (ref 3.5–5.3)
Sodium: 136 mmol/L (ref 135–146)

## 2015-03-31 ENCOUNTER — Telehealth: Payer: Self-pay | Admitting: *Deleted

## 2015-03-31 NOTE — Telephone Encounter (Signed)
Pt notified of lab results. Pt also notified that FLP/LFT was not supposed to be done on 2/17, but to be done on 5/5 like we went over at 2/10 appt which is also on the AVS. Pt advised keep 5/5 lab work as planned. Will Credit 2/17 FLP/LFT.

## 2015-05-13 ENCOUNTER — Emergency Department (HOSPITAL_COMMUNITY)
Admission: EM | Admit: 2015-05-13 | Discharge: 2015-05-13 | Disposition: A | Discharge status: Home / Self Care | Payer: Medicaid Other | Attending: Emergency Medicine | Admitting: Emergency Medicine

## 2015-05-13 ENCOUNTER — Emergency Department (HOSPITAL_COMMUNITY): Payer: Medicaid Other

## 2015-05-13 ENCOUNTER — Encounter (HOSPITAL_COMMUNITY): Payer: Self-pay

## 2015-05-13 DIAGNOSIS — M25561 Pain in right knee: Secondary | ICD-10-CM | POA: Diagnosis not present

## 2015-05-13 DIAGNOSIS — Z862 Personal history of diseases of the blood and blood-forming organs and certain disorders involving the immune mechanism: Secondary | ICD-10-CM | POA: Diagnosis not present

## 2015-05-13 DIAGNOSIS — I10 Essential (primary) hypertension: Secondary | ICD-10-CM | POA: Insufficient documentation

## 2015-05-13 DIAGNOSIS — M25571 Pain in right ankle and joints of right foot: Secondary | ICD-10-CM | POA: Insufficient documentation

## 2015-05-13 DIAGNOSIS — Z79899 Other long term (current) drug therapy: Secondary | ICD-10-CM | POA: Insufficient documentation

## 2015-05-13 DIAGNOSIS — I252 Old myocardial infarction: Secondary | ICD-10-CM | POA: Insufficient documentation

## 2015-05-13 DIAGNOSIS — Z7982 Long term (current) use of aspirin: Secondary | ICD-10-CM | POA: Diagnosis not present

## 2015-05-13 DIAGNOSIS — Z87442 Personal history of urinary calculi: Secondary | ICD-10-CM | POA: Diagnosis not present

## 2015-05-13 DIAGNOSIS — G8929 Other chronic pain: Secondary | ICD-10-CM | POA: Diagnosis not present

## 2015-05-13 DIAGNOSIS — F319 Bipolar disorder, unspecified: Secondary | ICD-10-CM | POA: Diagnosis not present

## 2015-05-13 DIAGNOSIS — Z87891 Personal history of nicotine dependence: Secondary | ICD-10-CM | POA: Insufficient documentation

## 2015-05-13 DIAGNOSIS — I2510 Atherosclerotic heart disease of native coronary artery without angina pectoris: Secondary | ICD-10-CM | POA: Insufficient documentation

## 2015-05-13 MED ORDER — GABAPENTIN 100 MG PO CAPS: 100.0000 mg | ORAL_CAPSULE | Freq: Three times a day (TID) | ORAL | Status: DC

## 2015-05-13 MED ORDER — KETOROLAC TROMETHAMINE 60 MG/2ML IM SOLN
60.0000 mg | Freq: Once | INTRAMUSCULAR | Status: DC
  Filled 2015-05-13: qty 2

## 2015-05-13 MED ORDER — FENTANYL CITRATE (PF) 100 MCG/2ML IJ SOLN
50.0000 ug | Freq: Once | INTRAMUSCULAR | Status: AC
  Administered 2015-05-13: 50 ug via INTRAMUSCULAR
  Filled 2015-05-13: qty 2

## 2015-05-13 MED ORDER — MELOXICAM 15 MG PO TABS: 15.0000 mg | ORAL_TABLET | Freq: Every day | ORAL | Status: DC

## 2015-05-13 NOTE — ED Notes (Signed)
Patient transported to X-ray 
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
## 2015-05-13 NOTE — Discharge Instructions (Signed)
You have been seen today for knee and ankle pain. Your imaging showed no acute abnormalities. Follow up with PCP as soon as possible for chronic management. Return to ED should symptoms worsen.
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
## 2015-06-13 ENCOUNTER — Encounter: Payer: Medicaid Other | Admitting: Physician Assistant

## 2015-06-13 ENCOUNTER — Other Ambulatory Visit: Payer: Self-pay

## 2015-06-20 ENCOUNTER — Encounter: Payer: Self-pay | Admitting: Physician Assistant

## 2015-06-25 ENCOUNTER — Emergency Department (HOSPITAL_COMMUNITY): Payer: Medicaid Other

## 2015-06-25 ENCOUNTER — Telehealth: Payer: Self-pay

## 2015-06-25 ENCOUNTER — Emergency Department (HOSPITAL_COMMUNITY)
Admission: EM | Admit: 2015-06-25 | Discharge: 2015-06-25 | Disposition: A | Discharge status: Home / Self Care | Payer: Medicaid Other | Attending: Emergency Medicine | Admitting: Emergency Medicine

## 2015-06-25 ENCOUNTER — Encounter (HOSPITAL_COMMUNITY): Payer: Self-pay | Admitting: Emergency Medicine

## 2015-06-25 DIAGNOSIS — I252 Old myocardial infarction: Secondary | ICD-10-CM | POA: Diagnosis not present

## 2015-06-25 DIAGNOSIS — F319 Bipolar disorder, unspecified: Secondary | ICD-10-CM | POA: Insufficient documentation

## 2015-06-25 DIAGNOSIS — Z8719 Personal history of other diseases of the digestive system: Secondary | ICD-10-CM | POA: Diagnosis not present

## 2015-06-25 DIAGNOSIS — Z87891 Personal history of nicotine dependence: Secondary | ICD-10-CM | POA: Diagnosis not present

## 2015-06-25 DIAGNOSIS — L600 Ingrowing nail: Secondary | ICD-10-CM | POA: Insufficient documentation

## 2015-06-25 DIAGNOSIS — Z862 Personal history of diseases of the blood and blood-forming organs and certain disorders involving the immune mechanism: Secondary | ICD-10-CM | POA: Insufficient documentation

## 2015-06-25 DIAGNOSIS — Z87828 Personal history of other (healed) physical injury and trauma: Secondary | ICD-10-CM | POA: Insufficient documentation

## 2015-06-25 DIAGNOSIS — Z87442 Personal history of urinary calculi: Secondary | ICD-10-CM | POA: Insufficient documentation

## 2015-06-25 DIAGNOSIS — I10 Essential (primary) hypertension: Secondary | ICD-10-CM | POA: Insufficient documentation

## 2015-06-25 DIAGNOSIS — I2510 Atherosclerotic heart disease of native coronary artery without angina pectoris: Secondary | ICD-10-CM | POA: Diagnosis not present

## 2015-06-25 DIAGNOSIS — Z791 Long term (current) use of non-steroidal anti-inflammatories (NSAID): Secondary | ICD-10-CM | POA: Insufficient documentation

## 2015-06-25 DIAGNOSIS — Z79899 Other long term (current) drug therapy: Secondary | ICD-10-CM | POA: Insufficient documentation

## 2015-06-25 DIAGNOSIS — R079 Chest pain, unspecified: Secondary | ICD-10-CM | POA: Diagnosis present

## 2015-06-25 DIAGNOSIS — R0789 Other chest pain: Secondary | ICD-10-CM | POA: Insufficient documentation

## 2015-06-25 LAB — COMPREHENSIVE METABOLIC PANEL
ALT: 12 U/L — ABNORMAL LOW (ref 14–54)
AST: 22 U/L (ref 15–41)
Albumin: 3.3 g/dL — ABNORMAL LOW (ref 3.5–5.0)
Alkaline Phosphatase: 73 U/L (ref 38–126)
Anion gap: 9 (ref 5–15)
BUN: 12 mg/dL (ref 6–20)
CO2: 26 mmol/L (ref 22–32)
Calcium: 9.5 mg/dL (ref 8.9–10.3)
Chloride: 107 mmol/L (ref 101–111)
Creatinine, Ser: 0.81 mg/dL (ref 0.44–1.00)
GFR calc Af Amer: >60 mL/min (ref >60)
GFR calc non Af Amer: >60 mL/min (ref >60)
Glucose, Bld: 87 mg/dL (ref 65–99)
Potassium: 4.8 mmol/L (ref 3.5–5.1)
Sodium: 142 mmol/L (ref 135–145)
Total Bilirubin: 0.9 mg/dL (ref 0.3–1.2)
Total Protein: 6.3 g/dL — ABNORMAL LOW (ref 6.5–8.1)

## 2015-06-25 LAB — BRAIN NATRIURETIC PEPTIDE: B Natriuretic Peptide: 100.6 pg/mL — ABNORMAL HIGH (ref 0.0–100.0)

## 2015-06-25 LAB — URINE MICROSCOPIC-ADD ON

## 2015-06-25 LAB — URINALYSIS, ROUTINE W REFLEX MICROSCOPIC
Bilirubin Urine: NEGATIVE
Glucose, UA: NEGATIVE mg/dL
Hgb urine dipstick: NEGATIVE
Ketones, ur: NEGATIVE mg/dL
Nitrite: NEGATIVE
Protein, ur: NEGATIVE mg/dL
Specific Gravity, Urine: 1.025 (ref 1.005–1.030)
pH: 7 (ref 5.0–8.0)

## 2015-06-25 LAB — I-STAT TROPONIN, ED
Troponin i, poc: 0 ng/mL (ref 0.00–0.08)
Troponin i, poc: 0.01 ng/mL (ref 0.00–0.08)

## 2015-06-25 MED ORDER — ASPIRIN 81 MG PO CHEW
324.0000 mg | CHEWABLE_TABLET | Freq: Once | ORAL | Status: DC
Start: 2015-06-25 — End: 2015-06-25

## 2015-06-25 MED ORDER — NITROGLYCERIN 0.4 MG SL SUBL: 0.4000 mg | SUBLINGUAL_TABLET | SUBLINGUAL | Status: DC | PRN
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
## 2015-06-25 NOTE — Discharge Instructions (Signed)
Your caregiver has diagnosed you as having chest pain that is not specific for one problem, but does not require admission.  You are at low risk for an acute heart condition or other serious illness. Chest pain comes from many different causes.  °SEEK IMMEDIATE MEDICAL ATTENTION IF: °You have severe chest pain, especially if the pain is crushing or pressure-like and spreads to the arms, back, neck, or jaw, or if you have sweating, nausea (feeling sick to your stomach), or shortness of breath. THIS IS AN EMERGENCY. Don't wait to see if the pain will go away. Get medical help at once. Call 911 or 0 (operator). DO NOT drive yourself to the hospital.  °Your chest pain gets worse and does not go away with rest.  °You have an attack of chest pain lasting longer than usual, despite rest and treatment with the medications your caregiver has prescribed.  °You wake from sleep with chest pain or shortness of breath.  °You feel dizzy or faint.  °You have chest pain not typical of your usual pain for which you originally saw your caregiver. ° °Chest Wall Pain °Chest wall pain is pain in or around the bones and muscles of your chest. Sometimes, an injury causes this pain. Sometimes, the cause may not be known. This pain may take several weeks or longer to get better. °HOME CARE INSTRUCTIONS  °Pay attention to any changes in your symptoms. Take these actions to help with your pain:  °· Rest as told by your health care provider.   °· Avoid activities that cause pain. These include any activities that use your chest muscles or your abdominal and side muscles to lift heavy items.    °· If directed, apply ice to the painful area: °¨ Put ice in a plastic bag. °¨ Place a towel between your skin and the bag. °¨ Leave the ice on for 20 minutes, 2-3 times per day. °· Take over-the-counter and prescription medicines only as told by your health care provider. °· Do not use tobacco products, including cigarettes, chewing tobacco, and  e-cigarettes. If you need help quitting, ask your health care provider. °· Keep all follow-up visits as told by your health care provider. This is important. °SEEK MEDICAL CARE IF: °· You have a fever. °· Your chest pain becomes worse. °· You have new symptoms. °SEEK IMMEDIATE MEDICAL CARE IF: °· You have nausea or vomiting. °· You feel sweaty or light-headed. °· You have a cough with phlegm (sputum) or you cough up blood. °· You develop shortness of breath. °  °This information is not intended to replace advice given to you by your health care provider. Make sure you discuss any questions you have with your health care provider. °  °Document Released: 01/25/2005 Document Revised: 10/16/2014 Document Reviewed: 04/22/2014 °Elsevier Interactive Patient Education ©2016 [employer] ° °

## 2015-06-25 NOTE — ED Notes (Signed)
Pt transported to xray 

## 2015-06-25 NOTE — ED Notes (Signed)
[facility].  Patient was seen at her dentist's office and per [facility] they dentist's office states her blood pressure was 210/165.  The patient complains of chest pain for two days that she describes as "cramping in her chest".  Also complains of intermittent shortness of breath.  Received 324mg  aspirin and 0.4 nitro SL en route.  Patient alert and oriented at this time.

## 2015-06-25 NOTE — Telephone Encounter (Signed)
Patient walked in to office complaining that her dentist refused to care for her 2 days in a row because her BP was elevated. She reports her BP at the dentist today was 210/165. Recommended to the patient she go to the ED for treatment.  She then reported she has experienced intermittent CP for 2 days and has CP now. When offered to call EMS, the patient accepted and 911 was called.  Waited with patient until EMS arrived and took patient to ED via stretcher.

## 2015-06-26 NOTE — Telephone Encounter (Signed)
Ok. Thank you, Tereso NewcomerScott Jillane Po, PA-C   06/26/2015 5:30 PM

## 2015-06-27 LAB — URINE CULTURE

## 2015-07-18 ENCOUNTER — Ambulatory Visit (INDEPENDENT_AMBULATORY_CARE_PROVIDER_SITE_OTHER): Payer: Medicaid Other | Admitting: Podiatry

## 2015-07-18 ENCOUNTER — Encounter: Payer: Self-pay | Admitting: Podiatry

## 2015-07-18 VITALS — BP 122/78 | HR 77 | Resp 12

## 2015-07-18 DIAGNOSIS — L600 Ingrowing nail: Secondary | ICD-10-CM

## 2015-07-18 DIAGNOSIS — B351 Tinea unguium: Secondary | ICD-10-CM

## 2015-07-18 MED ORDER — CEPHALEXIN 500 MG PO CAPS: 500.0000 mg | ORAL_CAPSULE | Freq: Three times a day (TID) | ORAL | Status: DC

## 2015-07-18 NOTE — Patient Instructions (Signed)

## 2015-07-18 NOTE — Progress Notes (Addendum)
   Subjective:    Patient ID: [name], female    DOB: April 07, 1965, 50 y.o.   MRN: [id number]  HPI  50 year old female presents the also concerns of her rightbig toenail and third digit toenail becoming painful and ingrown to as well as red around the nail. She states that previously had some pus coming from the but this has subsided. The areas are painful with pressure in shoes. She said no recent treatment other than trying to trim the toenails herself. No other complaints.  Review of Systems  All other systems reviewed and are negative.      Objective:   Physical Exam General: AAO x3, NAD  Dermatological: On the right foot on the lateral nail border of the hallux in the medial nail border of the third digit there is tenderness as well as localized edema and erythema without any drainage or pus. There is tenderness on these nail borders. There is no tenderness toenails at this time.  Vascular: Dorsalis Pedis artery and Posterior Tibial artery pedal pulses are 2/4 bilateral with immedate capillary fill time. Pedal hair growth present. No varicosities and no lower extremity edema present bilateral. There is no pain with calf compression, swelling, warmth, erythema.   Neruologic: Grossly intact via light touch bilateral. Vibratory intact via tuning fork bilateral. Protective threshold with Semmes Wienstein monofilament intact to all pedal sites bilateral. Patellar and Achilles deep tendon reflexes 2+ bilateral. No Babinski or clonus noted bilateral.   Musculoskeletal: No gross boney pedal deformities bilateral. No pain, crepitus, or limitation noted with foot and ankle range of motion bilateral. Muscular strength 5/5 in all groups tested bilateral.  Gait: Unassisted, Nonantalgic.      Assessment & Plan:  50 year old female right hallux and third digit symptomatic ingrown toenail -Treatment options discussed including all alternatives, risks, and complications -Etiology of symptoms were  discussed -At this time she is requesting Partial nail avulsions to the right hallux and third digit toenail to the symptomatic borders.Discussed the risks and complications of the procedure and she understands this and wishes to proceed. Under sterile conditions a mixture of lidocaine and Marcaine mixture was infiltrated in a digital block fashion to the symptomatic toenails. Once anesthetized the skin was prepped in sterile fashion and a tourniquet was then applied. Next the right hallux and third digit toenails removed in total. Area was cleaned and hemostasis achieved. Phenol was then applied under standard conditions and copiously irrigated. Area was cleaned and a bandage was applied. Tourniquet was removed and is found to be an immediate cap refill on all the digits. She tolerated the procedure well any complications -Keflex -Follow-up in 1 week or sooner if any problems arise. In the meantime, encouraged to call the office with any questions, concerns, change in symptoms.   Ovid CurdMatthew Wagoner, DPM

## 2015-07-25 ENCOUNTER — Ambulatory Visit (INDEPENDENT_AMBULATORY_CARE_PROVIDER_SITE_OTHER): Payer: Medicaid Other | Admitting: Podiatry

## 2015-07-25 ENCOUNTER — Encounter: Payer: Self-pay | Admitting: Podiatry

## 2015-07-25 DIAGNOSIS — B351 Tinea unguium: Secondary | ICD-10-CM

## 2015-07-25 DIAGNOSIS — Z9889 Other specified postprocedural states: Secondary | ICD-10-CM

## 2015-07-25 DIAGNOSIS — L600 Ingrowing nail: Secondary | ICD-10-CM

## 2015-07-27 NOTE — Progress Notes (Signed)
Patient ID: Dineen [name], female   DOB: 09-Nov-1965, 50 y.o.   MRN: [id number]  [name] presents to the office today for her scheduled appointment. However I was slowly coming to the office today due to surgery. She did not wish to wait to be seen by myself. She was seen by Misty StanleyLisa, my medical assistant. She states that she was doing well and she denies any drainage or pus or any redness. No pain at the toenails. She has been soaking in Epson salts, Neosporin and a Band-Aid. Misty StanleyLisa discussed with her to continue soaking and Neosporin during the day. The area uncovered at night. Follow-up there is any issues or any problems worse to call there is any questions.  I did see pictures of the patient's foot which did not appear to have any drainage or any pus. There is no edema present. There is appear to be scabbing over the small amount of granulation tissue present. I do not appreciate any erythema.  Ovid CurdMatthew Tywaun Hiltner, DPM

## 2015-08-04 ENCOUNTER — Telehealth: Payer: Self-pay | Admitting: *Deleted

## 2015-08-04 NOTE — Telephone Encounter (Signed)
Dr. Ardelle AntonWagoner reviewed 07/18/2015 fungal culture as negative. I informed pt and she thanked me and hung up.

## 2015-08-28 ENCOUNTER — Other Ambulatory Visit: Payer: Self-pay | Admitting: Orthopedic Surgery

## 2015-08-28 DIAGNOSIS — M1990 Unspecified osteoarthritis, unspecified site: Secondary | ICD-10-CM

## 2015-09-03 ENCOUNTER — Ambulatory Visit
Admission: RE | Admit: 2015-09-03 | Discharge: 2015-09-03 | Disposition: A | Discharge status: Home / Self Care | Payer: Medicaid Other | Source: Ambulatory Visit | Attending: Orthopedic Surgery | Admitting: Orthopedic Surgery

## 2015-09-03 DIAGNOSIS — M1990 Unspecified osteoarthritis, unspecified site: Secondary | ICD-10-CM

## 2015-09-03 MED ORDER — IOPAMIDOL (ISOVUE-M 200) INJECTION 41%
1.0000 mL | Freq: Once | INTRAMUSCULAR | Status: AC
  Administered 2015-09-03: 1 mL via INTRA_ARTICULAR

## 2015-09-03 MED ORDER — METHYLPREDNISOLONE ACETATE 40 MG/ML INJ SUSP (RADIOLOG: 120.0000 mg | Freq: Once | INTRAMUSCULAR | Status: DC

## 2015-09-05 ENCOUNTER — Encounter: Payer: Self-pay | Admitting: Podiatry

## 2015-09-08 ENCOUNTER — Other Ambulatory Visit: Payer: Self-pay | Admitting: Orthopedic Surgery

## 2015-09-08 DIAGNOSIS — S92001A Unspecified fracture of right calcaneus, initial encounter for closed fracture: Secondary | ICD-10-CM

## 2015-09-12 ENCOUNTER — Ambulatory Visit
Admission: RE | Admit: 2015-09-12 | Discharge: 2015-09-12 | Disposition: A | Discharge status: Home / Self Care | Payer: Medicaid Other | Source: Ambulatory Visit | Attending: Orthopedic Surgery | Admitting: Orthopedic Surgery

## 2015-09-12 DIAGNOSIS — S92001A Unspecified fracture of right calcaneus, initial encounter for closed fracture: Secondary | ICD-10-CM

## 2015-10-31 ENCOUNTER — Encounter: Payer: Self-pay | Admitting: Nurse Practitioner

## 2015-11-03 ENCOUNTER — Ambulatory Visit (INDEPENDENT_AMBULATORY_CARE_PROVIDER_SITE_OTHER): Payer: Medicaid Other | Admitting: Nurse Practitioner

## 2015-11-03 ENCOUNTER — Encounter (INDEPENDENT_AMBULATORY_CARE_PROVIDER_SITE_OTHER): Payer: Self-pay

## 2015-11-03 ENCOUNTER — Encounter: Payer: Self-pay | Admitting: Nurse Practitioner

## 2015-11-03 VITALS — BP 178/82 | HR 68 | Ht 61.0 in | Wt 173.8 lb

## 2015-11-03 DIAGNOSIS — I10 Essential (primary) hypertension: Secondary | ICD-10-CM | POA: Diagnosis not present

## 2015-11-03 DIAGNOSIS — Z720 Tobacco use: Secondary | ICD-10-CM

## 2015-11-03 DIAGNOSIS — E785 Hyperlipidemia, unspecified: Secondary | ICD-10-CM

## 2015-11-03 DIAGNOSIS — I2510 Atherosclerotic heart disease of native coronary artery without angina pectoris: Secondary | ICD-10-CM | POA: Diagnosis not present

## 2015-11-03 DIAGNOSIS — G4733 Obstructive sleep apnea (adult) (pediatric): Secondary | ICD-10-CM

## 2015-11-03 MED ORDER — ASPIRIN EC 81 MG PO TBEC: 81.0000 mg | DELAYED_RELEASE_TABLET | Freq: Every day | ORAL | 3 refills | Status: DC

## 2015-11-03 MED ORDER — ATORVASTATIN CALCIUM 40 MG PO TABS: 40.0000 mg | ORAL_TABLET | Freq: Every day | ORAL | 3 refills | Status: DC

## 2015-11-03 MED ORDER — TRIAMTERENE-HCTZ 37.5-25 MG PO TABS: 1.0000 | ORAL_TABLET | Freq: Every day | ORAL | 3 refills | Status: DC

## 2015-11-03 MED ORDER — METOPROLOL TARTRATE 50 MG PO TABS: 50.0000 mg | ORAL_TABLET | Freq: Two times a day (BID) | ORAL | 3 refills | Status: DC
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
## 2015-11-03 NOTE — Patient Instructions (Addendum)
We will be checking the following labs today - NONE   Medication Instructions:    Continue with your current medicines.   I am restarting aspirin 81 mg a day  I am restarting Lipitor 40 mg a day  I am restarting your Maxzide daily  I am restarting your Metoprolol 50 mg to take twice day    Testing/Procedures To Be Arranged:  N/A  Follow-Up:   See me in 2 weeks with fasting labs    Other Special Instructions:   Try to cut back on your salt  Stop the Dr. Reino KentPepper    If you need a refill on your cardiac medications before your next appointment, please call your pharmacy.   Call the [facility] office at [phone] if you have any questions, problems or concerns.

## 2015-11-14 ENCOUNTER — Other Ambulatory Visit: Payer: Self-pay | Admitting: Orthopedic Surgery

## 2015-11-17 ENCOUNTER — Ambulatory Visit: Payer: Medicaid Other | Admitting: Nurse Practitioner
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
## 2015-11-19 ENCOUNTER — Encounter (HOSPITAL_COMMUNITY): Payer: Self-pay | Admitting: *Deleted

## 2015-11-19 ENCOUNTER — Emergency Department (HOSPITAL_COMMUNITY)
Admission: EM | Admit: 2015-11-19 | Discharge: 2015-11-19 | Disposition: A | Discharge status: Home / Self Care | Payer: Medicaid Other | Attending: Emergency Medicine | Admitting: Emergency Medicine

## 2015-11-19 ENCOUNTER — Encounter: Payer: Self-pay | Admitting: Nurse Practitioner

## 2015-11-19 DIAGNOSIS — Z7982 Long term (current) use of aspirin: Secondary | ICD-10-CM | POA: Diagnosis not present

## 2015-11-19 DIAGNOSIS — Y999 Unspecified external cause status: Secondary | ICD-10-CM | POA: Diagnosis not present

## 2015-11-19 DIAGNOSIS — W25XXXA Contact with sharp glass, initial encounter: Secondary | ICD-10-CM | POA: Insufficient documentation

## 2015-11-19 DIAGNOSIS — Z87891 Personal history of nicotine dependence: Secondary | ICD-10-CM | POA: Diagnosis not present

## 2015-11-19 DIAGNOSIS — Y929 Unspecified place or not applicable: Secondary | ICD-10-CM | POA: Diagnosis not present

## 2015-11-19 DIAGNOSIS — I252 Old myocardial infarction: Secondary | ICD-10-CM | POA: Diagnosis not present

## 2015-11-19 DIAGNOSIS — I10 Essential (primary) hypertension: Secondary | ICD-10-CM | POA: Insufficient documentation

## 2015-11-19 DIAGNOSIS — Y93G1 Activity, food preparation and clean up: Secondary | ICD-10-CM | POA: Insufficient documentation

## 2015-11-19 DIAGNOSIS — I2510 Atherosclerotic heart disease of native coronary artery without angina pectoris: Secondary | ICD-10-CM | POA: Diagnosis not present

## 2015-11-19 DIAGNOSIS — S61217A Laceration without foreign body of left little finger without damage to nail, initial encounter: Secondary | ICD-10-CM | POA: Diagnosis present

## 2015-11-19 DIAGNOSIS — Z955 Presence of coronary angioplasty implant and graft: Secondary | ICD-10-CM | POA: Insufficient documentation

## 2015-11-19 MED ORDER — LIDOCAINE HCL (PF) 1 % IJ SOLN
5.0000 mL | Freq: Once | INTRAMUSCULAR | Status: AC
  Administered 2015-11-19: 5 mL
  Filled 2015-11-19: qty 30
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
## 2015-11-19 NOTE — ED Triage Notes (Addendum)
Laceration the the right pinky at the bend of the finger- Pt had her finger soaked in saline -tolerated well.

## 2015-11-19 NOTE — Discharge Instructions (Signed)
Treatment: Keep the dressing applied today on until this time tomorrow. At that time, wash with warm soapy water, dry and apply bacitracin or triple antibiotic ointment and a clean dressing. Do this daily until your sutures are removed in 10 days.   Follow-up: Please return to emergency department in 10 days for suture removal. Please return sooner if you see any signs of infection including fever, increasing pain, redness, drainage from the area.

## 2015-12-05 ENCOUNTER — Emergency Department (HOSPITAL_COMMUNITY)
Admission: EM | Admit: 2015-12-05 | Discharge: 2015-12-05 | Disposition: A | Discharge status: Home / Self Care | Payer: Medicaid Other | Attending: Emergency Medicine | Admitting: Emergency Medicine

## 2015-12-05 ENCOUNTER — Encounter (HOSPITAL_COMMUNITY): Payer: Self-pay | Admitting: Emergency Medicine

## 2015-12-05 ENCOUNTER — Encounter (HOSPITAL_BASED_OUTPATIENT_CLINIC_OR_DEPARTMENT_OTHER): Payer: Self-pay | Admitting: *Deleted

## 2015-12-05 DIAGNOSIS — I252 Old myocardial infarction: Secondary | ICD-10-CM | POA: Insufficient documentation

## 2015-12-05 DIAGNOSIS — Z4802 Encounter for removal of sutures: Secondary | ICD-10-CM | POA: Insufficient documentation

## 2015-12-05 DIAGNOSIS — Z87891 Personal history of nicotine dependence: Secondary | ICD-10-CM | POA: Diagnosis not present

## 2015-12-05 DIAGNOSIS — I10 Essential (primary) hypertension: Secondary | ICD-10-CM | POA: Diagnosis not present

## 2015-12-05 DIAGNOSIS — Z955 Presence of coronary angioplasty implant and graft: Secondary | ICD-10-CM | POA: Insufficient documentation

## 2015-12-05 DIAGNOSIS — Z79899 Other long term (current) drug therapy: Secondary | ICD-10-CM | POA: Diagnosis not present

## 2015-12-05 DIAGNOSIS — Z7982 Long term (current) use of aspirin: Secondary | ICD-10-CM | POA: Diagnosis not present

## 2015-12-05 DIAGNOSIS — I2510 Atherosclerotic heart disease of native coronary artery without angina pectoris: Secondary | ICD-10-CM | POA: Insufficient documentation

## 2015-12-05 NOTE — ED Triage Notes (Signed)
Sutures to be removed.  Patient states "I am 20 days out, I'm late by 10 days".   Patient states finger feels fine and no problems.   R 5th finger.
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
## 2015-12-11 ENCOUNTER — Ambulatory Visit (HOSPITAL_BASED_OUTPATIENT_CLINIC_OR_DEPARTMENT_OTHER): Payer: Medicaid Other | Admitting: Anesthesiology

## 2015-12-11 ENCOUNTER — Ambulatory Visit (HOSPITAL_BASED_OUTPATIENT_CLINIC_OR_DEPARTMENT_OTHER)
Admission: RE | Admit: 2015-12-11 | Discharge: 2015-12-12 | Disposition: A | Discharge status: Home / Self Care | Payer: Medicaid Other | Source: Ambulatory Visit | Attending: Orthopedic Surgery | Admitting: Orthopedic Surgery

## 2015-12-11 ENCOUNTER — Encounter (HOSPITAL_BASED_OUTPATIENT_CLINIC_OR_DEPARTMENT_OTHER)
Admission: RE | Disposition: A | Discharge status: Home / Self Care | Payer: Self-pay | Source: Ambulatory Visit | Attending: Orthopedic Surgery

## 2015-12-11 ENCOUNTER — Encounter (HOSPITAL_BASED_OUTPATIENT_CLINIC_OR_DEPARTMENT_OTHER): Payer: Self-pay | Admitting: *Deleted

## 2015-12-11 DIAGNOSIS — X58XXXD Exposure to other specified factors, subsequent encounter: Secondary | ICD-10-CM | POA: Insufficient documentation

## 2015-12-11 DIAGNOSIS — M62471 Contracture of muscle, right ankle and foot: Secondary | ICD-10-CM | POA: Diagnosis not present

## 2015-12-11 DIAGNOSIS — I252 Old myocardial infarction: Secondary | ICD-10-CM | POA: Diagnosis not present

## 2015-12-11 DIAGNOSIS — Z885 Allergy status to narcotic agent status: Secondary | ICD-10-CM | POA: Diagnosis not present

## 2015-12-11 DIAGNOSIS — M19171 Post-traumatic osteoarthritis, right ankle and foot: Secondary | ICD-10-CM | POA: Diagnosis not present

## 2015-12-11 DIAGNOSIS — E785 Hyperlipidemia, unspecified: Secondary | ICD-10-CM | POA: Diagnosis not present

## 2015-12-11 DIAGNOSIS — Z87891 Personal history of nicotine dependence: Secondary | ICD-10-CM | POA: Insufficient documentation

## 2015-12-11 DIAGNOSIS — Z6832 Body mass index (BMI) 32.0-32.9, adult: Secondary | ICD-10-CM | POA: Insufficient documentation

## 2015-12-11 DIAGNOSIS — I2510 Atherosclerotic heart disease of native coronary artery without angina pectoris: Secondary | ICD-10-CM | POA: Diagnosis not present

## 2015-12-11 DIAGNOSIS — E669 Obesity, unspecified: Secondary | ICD-10-CM | POA: Diagnosis not present

## 2015-12-11 DIAGNOSIS — K219 Gastro-esophageal reflux disease without esophagitis: Secondary | ICD-10-CM | POA: Insufficient documentation

## 2015-12-11 DIAGNOSIS — S92001P Unspecified fracture of right calcaneus, subsequent encounter for fracture with malunion: Secondary | ICD-10-CM | POA: Insufficient documentation

## 2015-12-11 DIAGNOSIS — M19071 Primary osteoarthritis, right ankle and foot: Secondary | ICD-10-CM | POA: Diagnosis present

## 2015-12-11 DIAGNOSIS — Z803 Family history of malignant neoplasm of breast: Secondary | ICD-10-CM | POA: Insufficient documentation

## 2015-12-11 DIAGNOSIS — I10 Essential (primary) hypertension: Secondary | ICD-10-CM | POA: Insufficient documentation

## 2015-12-11 DIAGNOSIS — Z818 Family history of other mental and behavioral disorders: Secondary | ICD-10-CM | POA: Diagnosis not present

## 2015-12-11 DIAGNOSIS — Z7982 Long term (current) use of aspirin: Secondary | ICD-10-CM | POA: Diagnosis not present

## 2015-12-11 DIAGNOSIS — Z8249 Family history of ischemic heart disease and other diseases of the circulatory system: Secondary | ICD-10-CM | POA: Insufficient documentation

## 2015-12-11 DIAGNOSIS — Z801 Family history of malignant neoplasm of trachea, bronchus and lung: Secondary | ICD-10-CM | POA: Diagnosis not present

## 2015-12-11 DIAGNOSIS — F319 Bipolar disorder, unspecified: Secondary | ICD-10-CM | POA: Diagnosis not present

## 2015-12-11 HISTORY — DX: Primary osteoarthritis, right ankle and foot: M19.071

## 2015-12-11 HISTORY — PX: CALCANEAL OSTEOTOMY: SHX1281

## 2015-12-11 HISTORY — PX: FOOT ARTHRODESIS: SHX1655

## 2015-12-11 LAB — POCT I-STAT, CHEM 8
BUN: 10 mg/dL (ref 6–20)
Calcium, Ion: 1.18 mmol/L (ref 1.15–1.40)
Chloride: 107 mmol/L (ref 101–111)
Creatinine, Ser: 0.6 mg/dL (ref 0.44–1.00)
Glucose, Bld: 99 mg/dL (ref 65–99)
HCT: 43 % (ref 36.0–46.0)
Hemoglobin: 14.6 g/dL (ref 12.0–15.0)
Potassium: 4 mmol/L (ref 3.5–5.1)
Sodium: 141 mmol/L (ref 135–145)
TCO2: 27 mmol/L (ref 0–100)

## 2015-12-11 SURGERY — OSTEOTOMY, CALCANEUS
Anesthesia: Regional | Site: Foot | Laterality: Right

## 2015-12-11 MED ORDER — ONDANSETRON HCL 4 MG PO TABS
4.0000 mg | ORAL_TABLET | Freq: Four times a day (QID) | ORAL | Status: DC | PRN
  Administered 2015-12-11: 4 mg via ORAL
  Filled 2015-12-11: qty 1

## 2015-12-11 MED ORDER — OXYCODONE HCL 5 MG PO TABS
5.0000 mg | ORAL_TABLET | ORAL | Status: DC | PRN
  Administered 2015-12-11 – 2015-12-12 (×3): 10 mg via ORAL
  Filled 2015-12-11 (×3): qty 2

## 2015-12-11 MED ORDER — FENTANYL CITRATE (PF) 100 MCG/2ML IJ SOLN
50.0000 ug | INTRAMUSCULAR | Status: DC | PRN
  Administered 2015-12-11: 100 ug via INTRAVENOUS

## 2015-12-11 MED ORDER — FENTANYL CITRATE (PF) 100 MCG/2ML IJ SOLN
INTRAMUSCULAR | Status: AC
  Filled 2015-12-11: qty 2

## 2015-12-11 MED ORDER — ATORVASTATIN CALCIUM 40 MG PO TABS: 40.0000 mg | ORAL_TABLET | Freq: Every day | ORAL | Status: DC

## 2015-12-11 MED ORDER — EPHEDRINE SULFATE 50 MG/ML IJ SOLN
INTRAMUSCULAR | Status: DC | PRN
  Administered 2015-12-11: 10 mg via INTRAVENOUS

## 2015-12-11 MED ORDER — 0.9 % SODIUM CHLORIDE (POUR BTL) OPTIME
TOPICAL | Status: DC | PRN
  Administered 2015-12-11: 200 mL

## 2015-12-11 MED ORDER — METOPROLOL TARTRATE 50 MG PO TABS
50.0000 mg | ORAL_TABLET | Freq: Two times a day (BID) | ORAL | Status: DC
  Filled 2015-12-11: qty 1

## 2015-12-11 MED ORDER — MORPHINE SULFATE (PF) 2 MG/ML IV SOLN: 2.0000 mg | INTRAVENOUS | Status: DC | PRN

## 2015-12-11 MED ORDER — ONDANSETRON HCL 4 MG PO TABS: 4.0000 mg | ORAL_TABLET | Freq: Three times a day (TID) | ORAL | 0 refills | Status: DC | PRN

## 2015-12-11 MED ORDER — BUPIVACAINE-EPINEPHRINE (PF) 0.5% -1:200000 IJ SOLN
INTRAMUSCULAR | Status: DC | PRN
  Administered 2015-12-11: 30 mL via PERINEURAL

## 2015-12-11 MED ORDER — LACTATED RINGERS IV SOLN
INTRAVENOUS | Status: DC
  Administered 2015-12-11: 16:00:00 via INTRAVENOUS

## 2015-12-11 MED ORDER — ZOLPIDEM TARTRATE 5 MG PO TABS
5.0000 mg | ORAL_TABLET | Freq: Every evening | ORAL | Status: AC | PRN
  Administered 2015-12-11: 5 mg via ORAL
  Filled 2015-12-11 (×2): qty 1

## 2015-12-11 MED ORDER — ASPIRIN EC 81 MG PO TBEC: 81.0000 mg | DELAYED_RELEASE_TABLET | Freq: Two times a day (BID) | ORAL | 0 refills | Status: DC

## 2015-12-11 MED ORDER — METOPROLOL TARTRATE 50 MG PO TABS
50.0000 mg | ORAL_TABLET | Freq: Once | ORAL | Status: AC
  Administered 2015-12-11: 50 mg via ORAL

## 2015-12-11 MED ORDER — MIDAZOLAM HCL 2 MG/2ML IJ SOLN
INTRAMUSCULAR | Status: AC
  Filled 2015-12-11: qty 2

## 2015-12-11 MED ORDER — PROMETHAZINE HCL 25 MG/ML IJ SOLN
6.2500 mg | INTRAMUSCULAR | Status: DC | PRN
Start: 2015-12-11 — End: 2015-12-11

## 2015-12-11 MED ORDER — ONDANSETRON HCL 4 MG/2ML IJ SOLN
INTRAMUSCULAR | Status: AC
  Filled 2015-12-11: qty 2

## 2015-12-11 MED ORDER — SCOPOLAMINE 1 MG/3DAYS TD PT72: 1.0000 | MEDICATED_PATCH | Freq: Once | TRANSDERMAL | Status: DC | PRN

## 2015-12-11 MED ORDER — ONDANSETRON HCL 4 MG/2ML IJ SOLN
INTRAMUSCULAR | Status: DC | PRN
Start: 2015-12-11 — End: 2015-12-11
  Administered 2015-12-11: 4 mg via INTRAVENOUS

## 2015-12-11 MED ORDER — ENOXAPARIN SODIUM 40 MG/0.4ML ~~LOC~~ SOLN
40.0000 mg | SUBCUTANEOUS | Status: DC
  Administered 2015-12-12: 40 mg via SUBCUTANEOUS
  Filled 2015-12-11: qty 0.4

## 2015-12-11 MED ORDER — LIDOCAINE HCL (CARDIAC) 20 MG/ML IV SOLN
INTRAVENOUS | Status: DC | PRN
  Administered 2015-12-11: 30 mg via INTRAVENOUS

## 2015-12-11 MED ORDER — FENTANYL CITRATE (PF) 100 MCG/2ML IJ SOLN
25.0000 ug | INTRAMUSCULAR | Status: DC | PRN
  Administered 2015-12-11: 50 ug via INTRAVENOUS

## 2015-12-11 MED ORDER — DEXAMETHASONE SODIUM PHOSPHATE 10 MG/ML IJ SOLN
INTRAMUSCULAR | Status: AC
  Filled 2015-12-11: qty 1

## 2015-12-11 MED ORDER — METOCLOPRAMIDE HCL 5 MG/ML IJ SOLN
5.0000 mg | Freq: Three times a day (TID) | INTRAMUSCULAR | Status: DC | PRN
  Administered 2015-12-12: 5 mg via INTRAVENOUS
  Filled 2015-12-11: qty 2

## 2015-12-11 MED ORDER — MIDAZOLAM HCL 2 MG/2ML IJ SOLN
1.0000 mg | INTRAMUSCULAR | Status: DC | PRN
  Administered 2015-12-11: 2 mg via INTRAVENOUS

## 2015-12-11 MED ORDER — MIDAZOLAM HCL 2 MG/2ML IJ SOLN: 1.0000 mg | INTRAMUSCULAR | Status: DC | PRN

## 2015-12-11 MED ORDER — METOPROLOL TARTRATE 25 MG PO TABS
ORAL_TABLET | ORAL | Status: AC
  Filled 2015-12-11: qty 2

## 2015-12-11 MED ORDER — DIVALPROEX SODIUM ER 500 MG PO TB24
1000.0000 mg | ORAL_TABLET | Freq: Two times a day (BID) | ORAL | Status: DC
  Filled 2015-12-11: qty 2

## 2015-12-11 MED ORDER — TRIAMTERENE-HCTZ 37.5-25 MG PO TABS: 1.0000 | ORAL_TABLET | Freq: Every day | ORAL | Status: DC

## 2015-12-11 MED ORDER — FENTANYL CITRATE (PF) 100 MCG/2ML IJ SOLN: 50.0000 ug | INTRAMUSCULAR | Status: DC | PRN

## 2015-12-11 MED ORDER — METOCLOPRAMIDE HCL 5 MG PO TABS: 5.0000 mg | ORAL_TABLET | Freq: Three times a day (TID) | ORAL | Status: DC | PRN

## 2015-12-11 MED ORDER — CHLORHEXIDINE GLUCONATE 4 % EX LIQD: 60.0000 mL | Freq: Once | CUTANEOUS | Status: DC

## 2015-12-11 MED ORDER — OXYCODONE HCL 5 MG PO TABS: 5.0000 mg | ORAL_TABLET | ORAL | 0 refills | Status: DC | PRN

## 2015-12-11 MED ORDER — LACTATED RINGERS IV SOLN: INTRAVENOUS | Status: DC

## 2015-12-11 MED ORDER — CEFAZOLIN SODIUM-DEXTROSE 2-4 GM/100ML-% IV SOLN
2.0000 g | INTRAVENOUS | Status: AC
  Administered 2015-12-11: 2 g via INTRAVENOUS

## 2015-12-11 MED ORDER — PROPOFOL 10 MG/ML IV BOLUS
INTRAVENOUS | Status: DC | PRN
  Administered 2015-12-11: 200 mg via INTRAVENOUS

## 2015-12-11 MED ORDER — GLYCOPYRROLATE 0.2 MG/ML IJ SOLN: 0.2000 mg | Freq: Once | INTRAMUSCULAR | Status: DC | PRN

## 2015-12-11 MED ORDER — LIDOCAINE 2% (20 MG/ML) 5 ML SYRINGE
INTRAMUSCULAR | Status: AC
  Filled 2015-12-11: qty 5

## 2015-12-11 MED ORDER — EPHEDRINE 5 MG/ML INJ
INTRAVENOUS | Status: AC
  Filled 2015-12-11: qty 10

## 2015-12-11 MED ORDER — SODIUM CHLORIDE 0.9 % IV SOLN: INTRAVENOUS | Status: DC

## 2015-12-11 MED ORDER — DEXAMETHASONE SODIUM PHOSPHATE 10 MG/ML IJ SOLN
INTRAMUSCULAR | Status: DC | PRN
  Administered 2015-12-11: 10 mg via INTRAVENOUS

## 2015-12-11 MED ORDER — PROPOFOL 10 MG/ML IV BOLUS
INTRAVENOUS | Status: AC
  Filled 2015-12-11: qty 20

## 2015-12-11 MED ORDER — ASPIRIN 81 MG PO CHEW: 81.0000 mg | CHEWABLE_TABLET | Freq: Two times a day (BID) | ORAL | Status: DC

## 2015-12-11 MED ORDER — ONDANSETRON HCL 4 MG/2ML IJ SOLN
4.0000 mg | Freq: Four times a day (QID) | INTRAMUSCULAR | Status: DC | PRN
  Administered 2015-12-11: 4 mg via INTRAVENOUS
  Filled 2015-12-11: qty 2

## 2015-12-11 MED ORDER — ESCITALOPRAM OXALATE 20 MG PO TABS
20.0000 mg | ORAL_TABLET | Freq: Every day | ORAL | Status: DC
  Filled 2015-12-11: qty 1

## 2015-12-11 MED ORDER — CELECOXIB 200 MG PO CAPS: 200.0000 mg | ORAL_CAPSULE | Freq: Two times a day (BID) | ORAL | Status: DC

## 2015-12-11 MED ORDER — LACTATED RINGERS IV SOLN
INTRAVENOUS | Status: DC
  Administered 2015-12-11 (×2): via INTRAVENOUS

## 2015-12-11 SURGICAL SUPPLY — 90 items
BANDAGE ESMARK 6X9 LF (GAUZE/BANDAGES/DRESSINGS) ×1 IMPLANT
BIT DRILL 4.8X200 CANN (BIT) ×2 IMPLANT
BIT DRILL 5/64X5 DISP (BIT) ×2 IMPLANT
BLADE AVERAGE 25MMX9MM (BLADE)
BLADE AVERAGE 25X9 (BLADE) IMPLANT
BLADE MICRO SAGITTAL (BLADE) ×3 IMPLANT
BLADE OSC/SAG .038X5.5 CUT EDG (BLADE) IMPLANT
BLADE SURG 11 STRL SS (BLADE) ×2 IMPLANT
BLADE SURG 15 STRL LF DISP TIS (BLADE) ×2 IMPLANT
BLADE SURG 15 STRL SS (BLADE) ×9
BNDG CMPR 9X4 STRL LF SNTH (GAUZE/BANDAGES/DRESSINGS)
BNDG CMPR 9X6 STRL LF SNTH (GAUZE/BANDAGES/DRESSINGS) ×1
BNDG COHESIVE 4X5 TAN STRL (GAUZE/BANDAGES/DRESSINGS) ×3 IMPLANT
BNDG COHESIVE 6X5 TAN STRL LF (GAUZE/BANDAGES/DRESSINGS) ×3 IMPLANT
BNDG ESMARK 4X9 LF (GAUZE/BANDAGES/DRESSINGS) IMPLANT
BNDG ESMARK 6X9 LF (GAUZE/BANDAGES/DRESSINGS) ×3
BUR EGG 3PK/BX (BURR) IMPLANT
CANISTER SUCT 1200ML W/VALVE (MISCELLANEOUS) ×3 IMPLANT
CHLORAPREP W/TINT 26ML (MISCELLANEOUS) ×3 IMPLANT
COVER BACK TABLE 60X90IN (DRAPES) ×3 IMPLANT
CUFF TOURNIQUET SINGLE 34IN LL (TOURNIQUET CUFF) ×3 IMPLANT
DRAPE EXTREMITY T 121X128X90 (DRAPE) ×3 IMPLANT
DRAPE OEC MINIVIEW 54X84 (DRAPES) ×3 IMPLANT
DRAPE U-SHAPE 47X51 STRL (DRAPES) ×3 IMPLANT
DRSG MEPITEL 4X7.2 (GAUZE/BANDAGES/DRESSINGS) ×3 IMPLANT
DRSG PAD ABDOMINAL 8X10 ST (GAUZE/BANDAGES/DRESSINGS) ×6 IMPLANT
ELECT REM PT RETURN 9FT ADLT (ELECTROSURGICAL) ×3
ELECTRODE REM PT RTRN 9FT ADLT (ELECTROSURGICAL) ×1 IMPLANT
GAUZE SPONGE 4X4 12PLY STRL (GAUZE/BANDAGES/DRESSINGS) ×3 IMPLANT
GLOVE BIO SURGEON STRL SZ8 (GLOVE) ×3 IMPLANT
GLOVE BIOGEL PI IND STRL 7.0 (GLOVE) IMPLANT
GLOVE BIOGEL PI IND STRL 8 (GLOVE) ×2 IMPLANT
GLOVE BIOGEL PI INDICATOR 7.0 (GLOVE) ×4
GLOVE BIOGEL PI INDICATOR 8 (GLOVE) ×2
GLOVE ECLIPSE 6.5 STRL STRAW (GLOVE) ×4 IMPLANT
GLOVE ECLIPSE 7.5 STRL STRAW (GLOVE) ×1 IMPLANT
GLOVE EXAM NITRILE MD LF STRL (GLOVE) IMPLANT
GOWN STRL REUS W/ TWL LRG LVL3 (GOWN DISPOSABLE) ×1 IMPLANT
GOWN STRL REUS W/ TWL XL LVL3 (GOWN DISPOSABLE) ×2 IMPLANT
GOWN STRL REUS W/TWL LRG LVL3 (GOWN DISPOSABLE) ×6
GOWN STRL REUS W/TWL XL LVL3 (GOWN DISPOSABLE) ×3
K-WIRE .062X4 (WIRE) ×2 IMPLANT
NDL SAFETY ECLIPSE 18X1.5 (NEEDLE) IMPLANT
NDL SUT 6 .5 CRC .975X.05 MAYO (NEEDLE) IMPLANT
NEEDLE HYPO 18GX1.5 SHARP (NEEDLE)
NEEDLE HYPO 22GX1.5 SAFETY (NEEDLE) IMPLANT
NEEDLE MAYO TAPER (NEEDLE)
NS IRRIG 1000ML POUR BTL (IV SOLUTION) ×3 IMPLANT
PACK BASIN DAY SURGERY FS (CUSTOM PROCEDURE TRAY) ×3 IMPLANT
PAD CAST 4YDX4 CTTN HI CHSV (CAST SUPPLIES) ×1 IMPLANT
PADDING CAST ABS 4INX4YD NS (CAST SUPPLIES)
PADDING CAST ABS COTTON 4X4 ST (CAST SUPPLIES) IMPLANT
PADDING CAST COTTON 4X4 STRL (CAST SUPPLIES) ×3
PADDING CAST COTTON 6X4 STRL (CAST SUPPLIES) ×3 IMPLANT
PENCIL BUTTON HOLSTER BLD 10FT (ELECTRODE) ×3 IMPLANT
PIN GUIDE DRILL TIP 2.8X300 (DRILL) ×2 IMPLANT
SANITIZER HAND PURELL 535ML FO (MISCELLANEOUS) ×3 IMPLANT
SCREW CANNULATED 6.5X50 (Screw) ×2 IMPLANT
SCREW CANNULATED 6.5X55 KNEE (Screw) ×2 IMPLANT
SCREW CANNULATED 6.5X65 KNEE (Screw) ×2 IMPLANT
SHEET MEDIUM DRAPE 40X70 STRL (DRAPES) ×3 IMPLANT
SLEEVE SCD COMPRESS KNEE MED (MISCELLANEOUS) ×3 IMPLANT
SPLINT FAST PLASTER 5X30 (CAST SUPPLIES) ×40
SPLINT PLASTER CAST FAST 5X30 (CAST SUPPLIES) ×20 IMPLANT
SPONGE LAP 18X18 X RAY DECT (DISPOSABLE) ×3 IMPLANT
SPONGE SURGIFOAM ABS GEL 12-7 (HEMOSTASIS) IMPLANT
STOCKINETTE 6  STRL (DRAPES) ×2
STOCKINETTE 6 STRL (DRAPES) ×1 IMPLANT
SUCTION FRAZIER HANDLE 10FR (MISCELLANEOUS) ×2
SUCTION TUBE FRAZIER 10FR DISP (MISCELLANEOUS) ×1 IMPLANT
SUT 2 FIBERLOOP 20 STRT BLUE (SUTURE)
SUT BONE WAX W31G (SUTURE) IMPLANT
SUT ETHIBOND 2 OS 4 DA (SUTURE) IMPLANT
SUT ETHILON 3 0 PS 1 (SUTURE) ×3 IMPLANT
SUT FIBERWIRE #2 38 T-5 BLUE (SUTURE)
SUT FIBERWIRE 2-0 18 17.9 3/8 (SUTURE)
SUT MNCRL AB 3-0 PS2 18 (SUTURE) ×3 IMPLANT
SUT VIC AB 0 SH 27 (SUTURE) ×3 IMPLANT
SUT VIC AB 2-0 SH 27 (SUTURE)
SUT VIC AB 2-0 SH 27XBRD (SUTURE) ×1 IMPLANT
SUTURE 2 FIBERLOOP 20 STRT BLU (SUTURE) IMPLANT
SUTURE FIBERWR #2 38 T-5 BLUE (SUTURE) IMPLANT
SUTURE FIBERWR 2-0 18 17.9 3/8 (SUTURE) IMPLANT
SYR BULB 3OZ (MISCELLANEOUS) ×3 IMPLANT
SYR CONTROL 10ML LL (SYRINGE) IMPLANT
TOWEL OR 17X24 6PK STRL BLUE (TOWEL DISPOSABLE) ×6 IMPLANT
TUBE CONNECTING 20'X1/4 (TUBING) ×1
TUBE CONNECTING 20X1/4 (TUBING) ×2 IMPLANT
UNDERPAD 30X30 (UNDERPADS AND DIAPERS) ×3 IMPLANT
YANKAUER SUCT BULB TIP NO VENT (SUCTIONS) IMPLANT
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
## 2015-12-11 NOTE — Transfer of Care (Signed)
Immediate Anesthesia Transfer of Care Note  Patient: [name]  Procedure(s) Performed: Procedure(s): RIGHT CALCANEAL OSTEOTOMY (Right) RIGHT SUBTALAR ARTHRODESIS (Right)  Patient Location: PACU  Anesthesia Type:GA combined with regional for post-op pain  Level of Consciousness: awake and patient cooperative  Airway & Oxygen Therapy: Patient Spontanous Breathing and Patient connected to face mask oxygen  Post-op Assessment: Report given to RN and Post -op Vital signs reviewed and stable  Post vital signs: Reviewed and stable  Last Vitals:  Vitals:   12/11/15 1155 12/11/15 1157  BP:    Pulse: 61 (!) 57  Resp: 14 12  Temp:      Last Pain:  Vitals:   12/11/15 1025  TempSrc: Oral  PainSc: 2          Complications: No apparent anesthesia complications

## 2015-12-11 NOTE — Anesthesia Procedure Notes (Addendum)
Anesthesia Regional Block:  Popliteal block  Pre-Anesthetic Checklist: ,, timeout performed, Correct Patient, Correct Site, Correct Laterality, Correct Procedure, Correct Position, site marked, Risks and benefits discussed,  Surgical consent,  Pre-op evaluation,  At surgeon's request and post-op pain management  Laterality: Right  Prep: chloraprep       Needles:  Injection technique: Single-shot  Needle Type: Echogenic Needle     Needle Length: 9cm 9 cm Needle Gauge: 21 and 21 G    Additional Needles:  Procedures: ultrasound guided (picture in chart) Popliteal block Narrative:  Start time: 12/11/2015 11:44 AM End time: 12/11/2015 11:54 AM Injection made incrementally with aspirations every 5 mL.  Performed by: Personally  Anesthesiologist: Cecile HearingURK, Chancelor Hardrick EDWARD  Additional Notes: No pain on injection. No increased resistance to injection. Injection made in 5cc increments.  Good needle visualization.  Patient tolerated procedure well.  Right popliteal/saphenous nerve block.

## 2015-12-11 NOTE — Brief Op Note (Signed)
12/11/2015  2:20 PM  PATIENT:  [name]  50 y.o. female  PRE-OPERATIVE DIAGNOSIS:   1.  RIGHT CALCANEUS MALUNION with subfibular impingement      2.  RIGHT SUBTALAR ARTHRITIS  POST-OPERATIVE DIAGNOSIS: same  Procedure(s): 1.  RIGHT CALCANEAL OSTEOTOMY 2.  Open right achilles tendon lengthening 3.  Right subtalar arthrodesis 4.  Autograft bone from calcaneus to subtalar joint 5.  3 view xrays of the right foot  SURGEON:  Toni ArthursJohn Lavon Horn, MD  ASSISTANT: n/a  ANESTHESIA:   General, regional  EBL:  minimal   TOURNIQUET:   Total Tourniquet Time Documented: Thigh (Right) - 58 minutes Total: Thigh (Right) - 58 minutes  COMPLICATIONS:  None apparent  DISPOSITION:  Extubated, awake and stable to recovery.  DICTATION ID:  [id number]

## 2015-12-11 NOTE — Anesthesia Procedure Notes (Signed)
Procedure Name: LMA Insertion Date/Time: 12/11/2015 12:11 PM Performed by: Sherwood Castilla D Pre-anesthesia Checklist: Patient identified, Emergency Drugs available, Suction available and Patient being monitored Patient Re-evaluated:Patient Re-evaluated prior to inductionOxygen Delivery Method: Circle system utilized Preoxygenation: Pre-oxygenation with 100% oxygen Intubation Type: IV induction Ventilation: Mask ventilation without difficulty LMA: LMA inserted LMA Size: 4.0 Number of attempts: 1 Airway Equipment and Method: Bite block Placement Confirmation: positive ETCO2 Tube secured with: Tape Dental Injury: Teeth and Oropharynx as per pre-operative assessment

## 2015-12-11 NOTE — Discharge Instructions (Addendum)
Toni ArthursJohn Hewitt, MD [facility]  Please read the following information regarding your care after surgery.  Medications  You only need a prescription for the narcotic pain medicine (ex. oxycodone, Percocet, Norco).  All of the other medicines listed below are available over the counter. X acetominophen (Tylenol) 650 mg every 4-6 hours as you need for minor pain X oxycodone as prescribed for moderate to severe pain ?   Narcotic pain medicine (ex. oxycodone, Percocet, Vicodin) will cause constipation.  To prevent this problem, take the following medicines while you are taking any pain medicine. X docusate sodium (Colace) 100 mg twice a day X senna (Senokot) 2 tablets twice a day  X To help prevent blood clots, take a baby aspirin (81 mg) twice a day for two weeks after surgery.  You should also get up every hour while you are awake to move around.    Weight Bearing ? Bear weight when you are able on your operated leg or foot. ? Bear weight only on the heel of your operated foot in the post-op shoe. X Do not bear any weight on the operated leg or foot.  Cast / Splint / Dressing ? Keep your splint or cast clean and dry.  Dont put anything (coat hanger, pencil, etc) down inside of it.  If it gets damp, use a hair dryer on the cool setting to dry it.  If it gets soaked, call the office to schedule an appointment for a cast change. ? Remove your dressing 3 days after surgery and cover the incisions with dry dressings.    After your dressing, cast or splint is removed; you may shower, but do not soak or scrub the wound.  Allow the water to run over it, and then gently pat it dry.  Swelling It is normal for you to have swelling where you had surgery.  To reduce swelling and pain, keep your toes above your nose for at least 3 days after surgery.  It may be necessary to keep your foot or leg elevated for several weeks.  If it hurts, it should be elevated.  Follow Up Call my office at  [phone] when you are discharged from the hospital or surgery center to schedule an appointment to be seen two weeks after surgery.  Call my office at [phone] if you develop a fever >101.5 F, nausea, vomiting, bleeding from the surgical site or severe pain.    Regional Anesthesia Blocks  1. Numbness or the inability to move the "blocked" extremity may last from 3-48 hours after placement. The length of time depends on the medication injected and your individual response to the medication. If the numbness is not going away after 48 hours, call your surgeon.  2. The extremity that is blocked will need to be protected until the numbness is gone and the  Strength has returned. Because you cannot feel it, you will need to take extra care to avoid injury. Because it may be weak, you may have difficulty moving it or using it. You may not know what position it is in without looking at it while the block is in effect.  3. For blocks in the legs and feet, returning to weight bearing and walking needs to be done carefully. You will need to wait until the numbness is entirely gone and the strength has returned. You should be able to move your leg and foot normally before you try and bear weight or walk. You will need someone to be  with you when you first try to ensure you do not fall and possibly risk injury.  4. Bruising and tenderness at the needle site are common side effects and will resolve in a few days.  5. Persistent numbness or new problems with movement should be communicated to the surgeon or the [facility] [phone])/ [facility] [phone]).

## 2015-12-11 NOTE — Anesthesia Postprocedure Evaluation (Signed)
Anesthesia Post Note  Patient: Dineen KidMonica Scheeler  Procedure(s) Performed: Procedure(s) (LRB): RIGHT CALCANEAL OSTEOTOMY (Right) RIGHT SUBTALAR ARTHRODESIS (Right)  Patient location during evaluation: PACU Anesthesia Type: General and Regional Level of consciousness: awake and alert Pain management: pain level controlled Vital Signs Assessment: post-procedure vital signs reviewed and stable Respiratory status: spontaneous breathing, nonlabored ventilation, respiratory function stable and patient connected to nasal cannula oxygen Cardiovascular status: blood pressure returned to baseline and stable Postop Assessment: no signs of nausea or vomiting Anesthetic complications: no    Last Vitals:  Vitals:   12/11/15 1417 12/11/15 1430  BP: (!) 159/101 (!) 154/86  Pulse: 78 82  Resp: 15 13  Temp: 36.6 C     Last Pain:  Vitals:   12/11/15 1430  TempSrc:   PainSc: 0-No pain        RLE Motor Response: No movement due to regional block (12/11/15 1430) RLE Sensation: Numbness;Tingling (12/11/15 1430)      Cecile HearingStephen Edward Turk

## 2015-12-11 NOTE — Anesthesia Preprocedure Evaluation (Addendum)
Anesthesia Evaluation  Patient identified by MRN, date of birth, ID band Patient awake    Reviewed: Allergy & Precautions, NPO status , Patient's Chart, lab work & pertinent test results, reviewed documented beta blocker date and time   History of Anesthesia Complications (+) DIFFICULT AIRWAY and history of anesthetic complications  Airway Mallampati: III  TM Distance: >3 FB Neck ROM: Full    Dental no notable dental hx. (+) Teeth Intact, Dental Advisory Given, Missing   Pulmonary former smoker,    Pulmonary exam normal breath sounds clear to auscultation       Cardiovascular hypertension, Pt. on home beta blockers and Pt. on medications (-) angina+ CAD, + Past MI and + Cardiac Stents  Normal cardiovascular exam Rhythm:Regular Rate:Normal  Echo 7/16: EF 65-70%, Gr 2 DD   Neuro/Psych PSYCHIATRIC DISORDERS Depression Bipolar Disorder negative neurological ROS     GI/Hepatic GERD  ,(+)     substance abuse  alcohol use,   Endo/Other  Obesity   Renal/GU negative Renal ROS  negative genitourinary   Musculoskeletal negative musculoskeletal ROS (+)   Abdominal   Peds negative pediatric ROS (+)  Hematology negative hematology ROS (+)   Anesthesia Other Findings Day of surgery medications reviewed with the patient.  Reproductive/Obstetrics negative OB ROS                            Anesthesia Physical  Anesthesia Plan  ASA: III  Anesthesia Plan: General and Regional   Post-op Pain Management:  Regional for Post-op pain and GA combined w/ Regional for post-op pain   Induction: Intravenous  Airway Management Planned: LMA  Additional Equipment:   Intra-op Plan:   Post-operative Plan: Extubation in OR  Informed Consent: I have reviewed the patients History and Physical, chart, labs and discussed the procedure including the risks, benefits and alternatives for the proposed anesthesia  with the patient or authorized representative who has indicated his/her understanding and acceptance.   Dental advisory given  Plan Discussed with: CRNA and Surgeon  Anesthesia Plan Comments: (Risks/benefits of general anesthesia discussed with patient including risk of damage to teeth, lips, gum, and tongue, nausea/vomiting, allergic reactions to medications, and the possibility of heart attack, stroke and death.  All patient questions answered.  Patient wishes to proceed.  GA plus popliteal/saphenous block.)       Anesthesia Quick Evaluation

## 2015-12-12 ENCOUNTER — Encounter (HOSPITAL_BASED_OUTPATIENT_CLINIC_OR_DEPARTMENT_OTHER): Payer: Self-pay | Admitting: Orthopedic Surgery

## 2015-12-12 DIAGNOSIS — S92001P Unspecified fracture of right calcaneus, subsequent encounter for fracture with malunion: Secondary | ICD-10-CM | POA: Diagnosis not present
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
## 2015-12-16 NOTE — Addendum Note (Signed)
Addendum  created 12/16/15 1230 by Cecile HearingStephen Edward Turk, MD   Anesthesia Intra Blocks edited, Sign clinical note

## 2016-01-26 NOTE — Addendum Note (Signed)
Addendum  created 01/26/16 1040 by Cecile HearingStephen Edward Turk, MD   Anesthesia Intra Blocks edited, Sign clinical note

## 2016-02-11 ENCOUNTER — Ambulatory Visit (INDEPENDENT_AMBULATORY_CARE_PROVIDER_SITE_OTHER): Payer: Medicaid Other | Admitting: Nurse Practitioner

## 2016-02-11 ENCOUNTER — Encounter: Payer: Self-pay | Admitting: Nurse Practitioner

## 2016-02-11 ENCOUNTER — Telehealth: Payer: Self-pay | Admitting: *Deleted

## 2016-02-11 ENCOUNTER — Encounter (INDEPENDENT_AMBULATORY_CARE_PROVIDER_SITE_OTHER): Payer: Self-pay

## 2016-02-11 VITALS — BP 200/100 | HR 61 | Ht 61.0 in | Wt 185.4 lb

## 2016-02-11 DIAGNOSIS — I2510 Atherosclerotic heart disease of native coronary artery without angina pectoris: Secondary | ICD-10-CM

## 2016-02-11 DIAGNOSIS — Z01818 Encounter for other preprocedural examination: Secondary | ICD-10-CM | POA: Diagnosis not present

## 2016-02-11 DIAGNOSIS — I10 Essential (primary) hypertension: Secondary | ICD-10-CM | POA: Diagnosis not present

## 2016-02-11 NOTE — Progress Notes (Signed)
Agree. Tahnks
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
## 2016-02-11 NOTE — Telephone Encounter (Signed)
Faxed to Dr. Joya MartyrJan Jua  Attention [place] in [place] for Rhinoplasty @ [phone].

## 2016-02-11 NOTE — Patient Instructions (Addendum)
We will be checking the following labs today - BMET, CBC, HPF, Lipids and A1C   Medication Instructions:    Continue with your current medicines.   Go get your fluid pill at the pharmacy and restart    Testing/Procedures To Be Arranged:  N/A  Follow-Up:   Nurse visit for a BP check next Monday/Tuesday/Wednesday  See me tentatively in 6 months.     Other Special Instructions:   Avoid salt!    If you need a refill on your cardiac medications before your next appointment, please call your pharmacy.   Call the [facility] office at [phone] if you have any questions, problems or concerns.

## 2016-02-12 ENCOUNTER — Telehealth: Payer: Self-pay | Admitting: Nurse Practitioner

## 2016-02-12 LAB — LIPID PANEL
Chol/HDL Ratio: 3.8 ratio units (ref 0.0–4.4)
Cholesterol, Total: 193 mg/dL (ref 100–199)
HDL: 51 mg/dL (ref >39)
LDL Calculated: 94 mg/dL (ref 0–99)
Triglycerides: 241 mg/dL — ABNORMAL HIGH (ref 0–149)
VLDL Cholesterol Cal: 48 mg/dL — ABNORMAL HIGH (ref 5–40)

## 2016-02-12 LAB — BASIC METABOLIC PANEL
BUN/Creatinine Ratio: 17 (ref 9–23)
BUN: 12 mg/dL (ref 6–24)
CO2: 21 mmol/L (ref 18–29)
Calcium: 9.7 mg/dL (ref 8.7–10.2)
Chloride: 103 mmol/L (ref 96–106)
Creatinine, Ser: 0.69 mg/dL (ref 0.57–1.00)
GFR calc Af Amer: 118 mL/min/{1.73_m2} (ref >59)
GFR calc non Af Amer: 103 mL/min/{1.73_m2} (ref >59)
Glucose: 87 mg/dL (ref 65–99)
Potassium: 4.4 mmol/L (ref 3.5–5.2)
Sodium: 140 mmol/L (ref 134–144)

## 2016-02-12 LAB — CBC
Hematocrit: 42.9 % (ref 34.0–46.6)
Hemoglobin: 13.7 g/dL (ref 11.1–15.9)
MCH: 28 pg (ref 26.6–33.0)
MCHC: 31.9 g/dL (ref 31.5–35.7)
MCV: 88 fL (ref 79–97)
Platelets: 315 10*3/uL (ref 150–379)
RBC: 4.9 x10E6/uL (ref 3.77–5.28)
RDW: 14.1 % (ref 12.3–15.4)
WBC: 4.6 10*3/uL (ref 3.4–10.8)

## 2016-02-12 LAB — HEPATIC FUNCTION PANEL
ALT: 12 IU/L (ref 0–32)
AST: 19 IU/L (ref 0–40)
Albumin: 4.1 g/dL (ref 3.5–5.5)
Alkaline Phosphatase: 96 IU/L (ref 39–117)
Bilirubin Total: 0.3 mg/dL (ref 0.0–1.2)
Bilirubin, Direct: 0.09 mg/dL (ref 0.00–0.40)
Total Protein: 7.1 g/dL (ref 6.0–8.5)

## 2016-02-12 LAB — HEMOGLOBIN A1C
Est. average glucose Bld gHb Est-mCnc: 111 mg/dL
Hgb A1c MFr Bld: 5.5 % (ref 4.8–5.6)

## 2016-02-12 NOTE — Telephone Encounter (Signed)
Follow up ° °Pt voiced returning nurses call. ° °Please f/u °

## 2016-02-12 NOTE — Telephone Encounter (Signed)
Follow Up:; ° ° °Returning your call. °

## 2016-02-18 ENCOUNTER — Ambulatory Visit (INDEPENDENT_AMBULATORY_CARE_PROVIDER_SITE_OTHER): Payer: Medicaid Other | Admitting: *Deleted

## 2016-02-18 ENCOUNTER — Encounter: Payer: Self-pay | Admitting: Nurse Practitioner

## 2016-02-18 VITALS — BP 130/82 | HR 72 | Wt 184.8 lb

## 2016-02-18 DIAGNOSIS — I10 Essential (primary) hypertension: Secondary | ICD-10-CM

## 2016-02-18 NOTE — Telephone Encounter (Signed)
Pt came in today for recheck bp 132/88 pt was cleared for rhinoplasty. Will fax Lawson FiscalLori note pt has been cleared surgery and last cbc results to [phone].

## 2016-02-18 NOTE — Progress Notes (Signed)
Patient in for B/P check. Wt 184.8, B/P 130/82, HR 72.  Info given to requesting provider, Norma FredricksonLori Gerhardt.  Patient given surgical clearance.

## 2016-07-07 ENCOUNTER — Encounter: Payer: Self-pay | Admitting: Nurse Practitioner

## 2016-07-20 ENCOUNTER — Ambulatory Visit: Payer: Medicaid Other | Admitting: Nurse Practitioner

## 2016-08-04 ENCOUNTER — Ambulatory Visit: Payer: Medicaid Other | Admitting: Nurse Practitioner
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
## 2016-08-05 ENCOUNTER — Encounter: Payer: Self-pay | Admitting: Nurse Practitioner
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
## 2016-12-14 IMAGING — CR DG TIBIA/FIBULA PORT 2V*R*
2 series · 2 of 2 positions shown · non-contrast
Comparison: None.

CLINICAL DATA: Postop bilateral ex fix images

EXAM:
PORTABLE RIGHT TIBIA AND FIBULA - 2 VIEW

[AP]
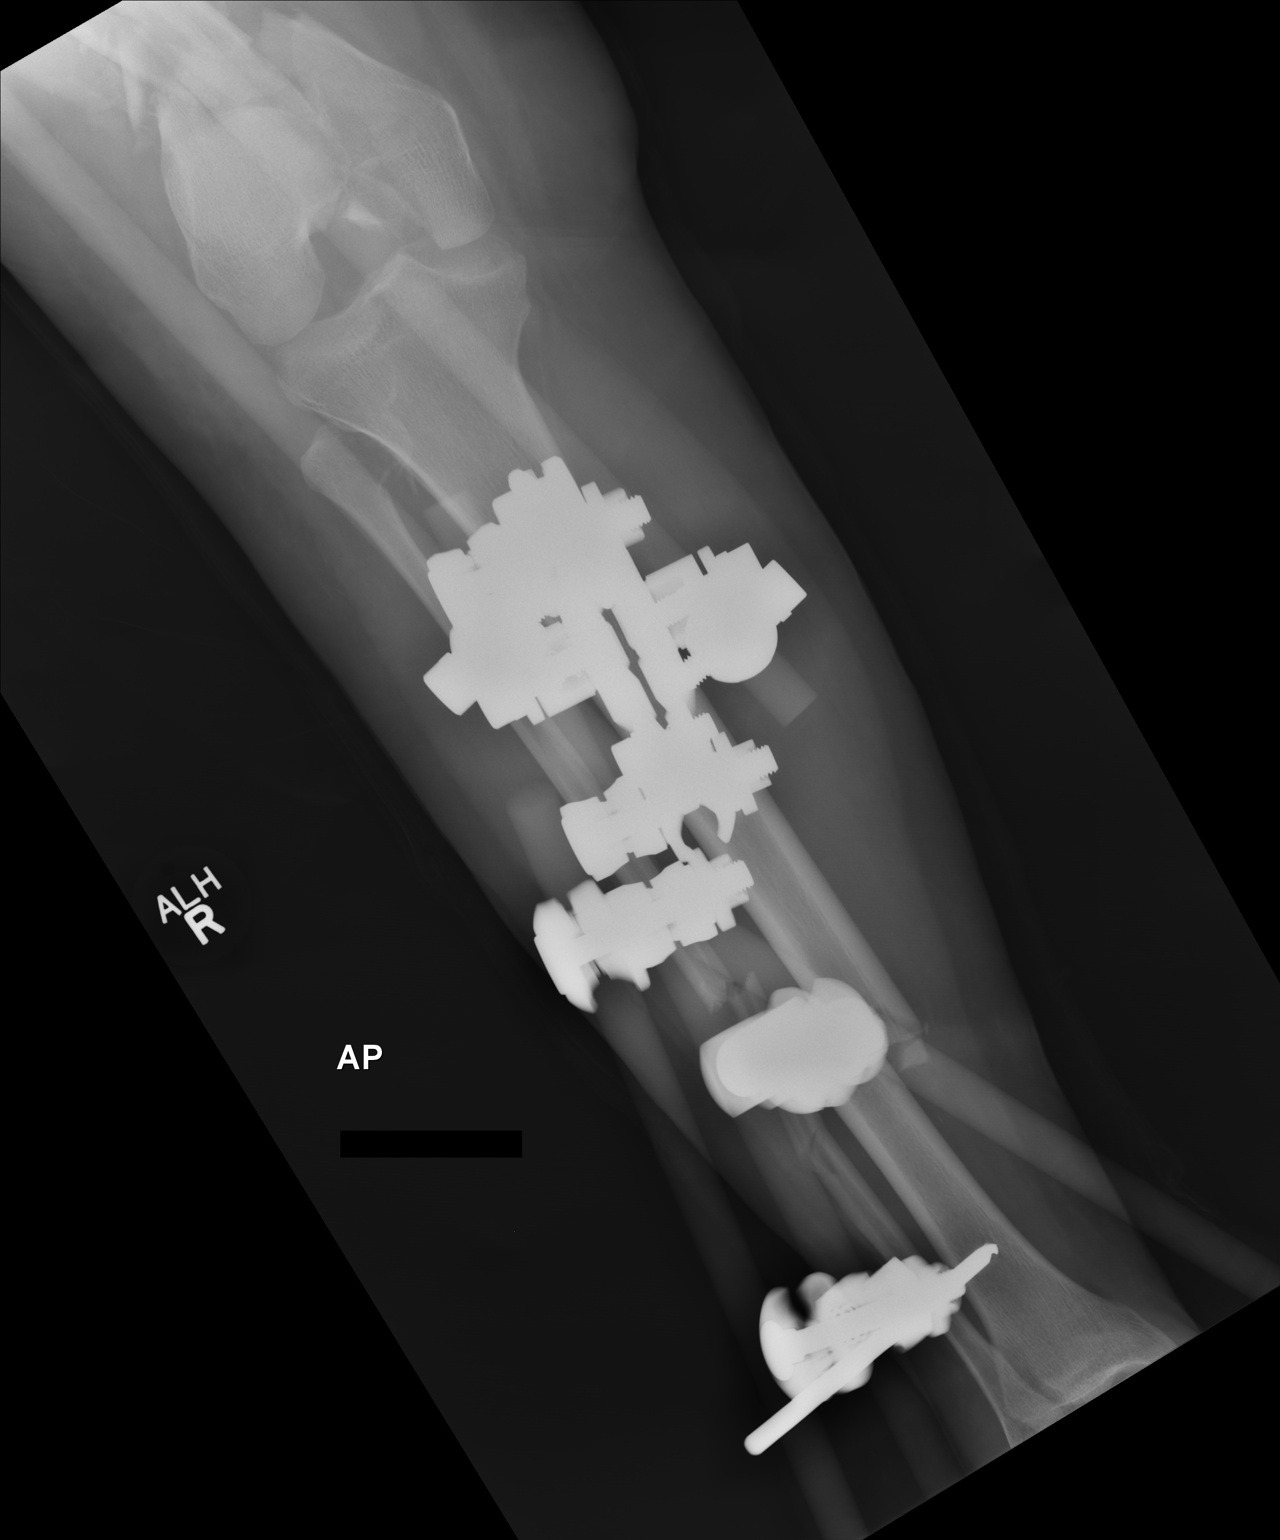

[lateral]
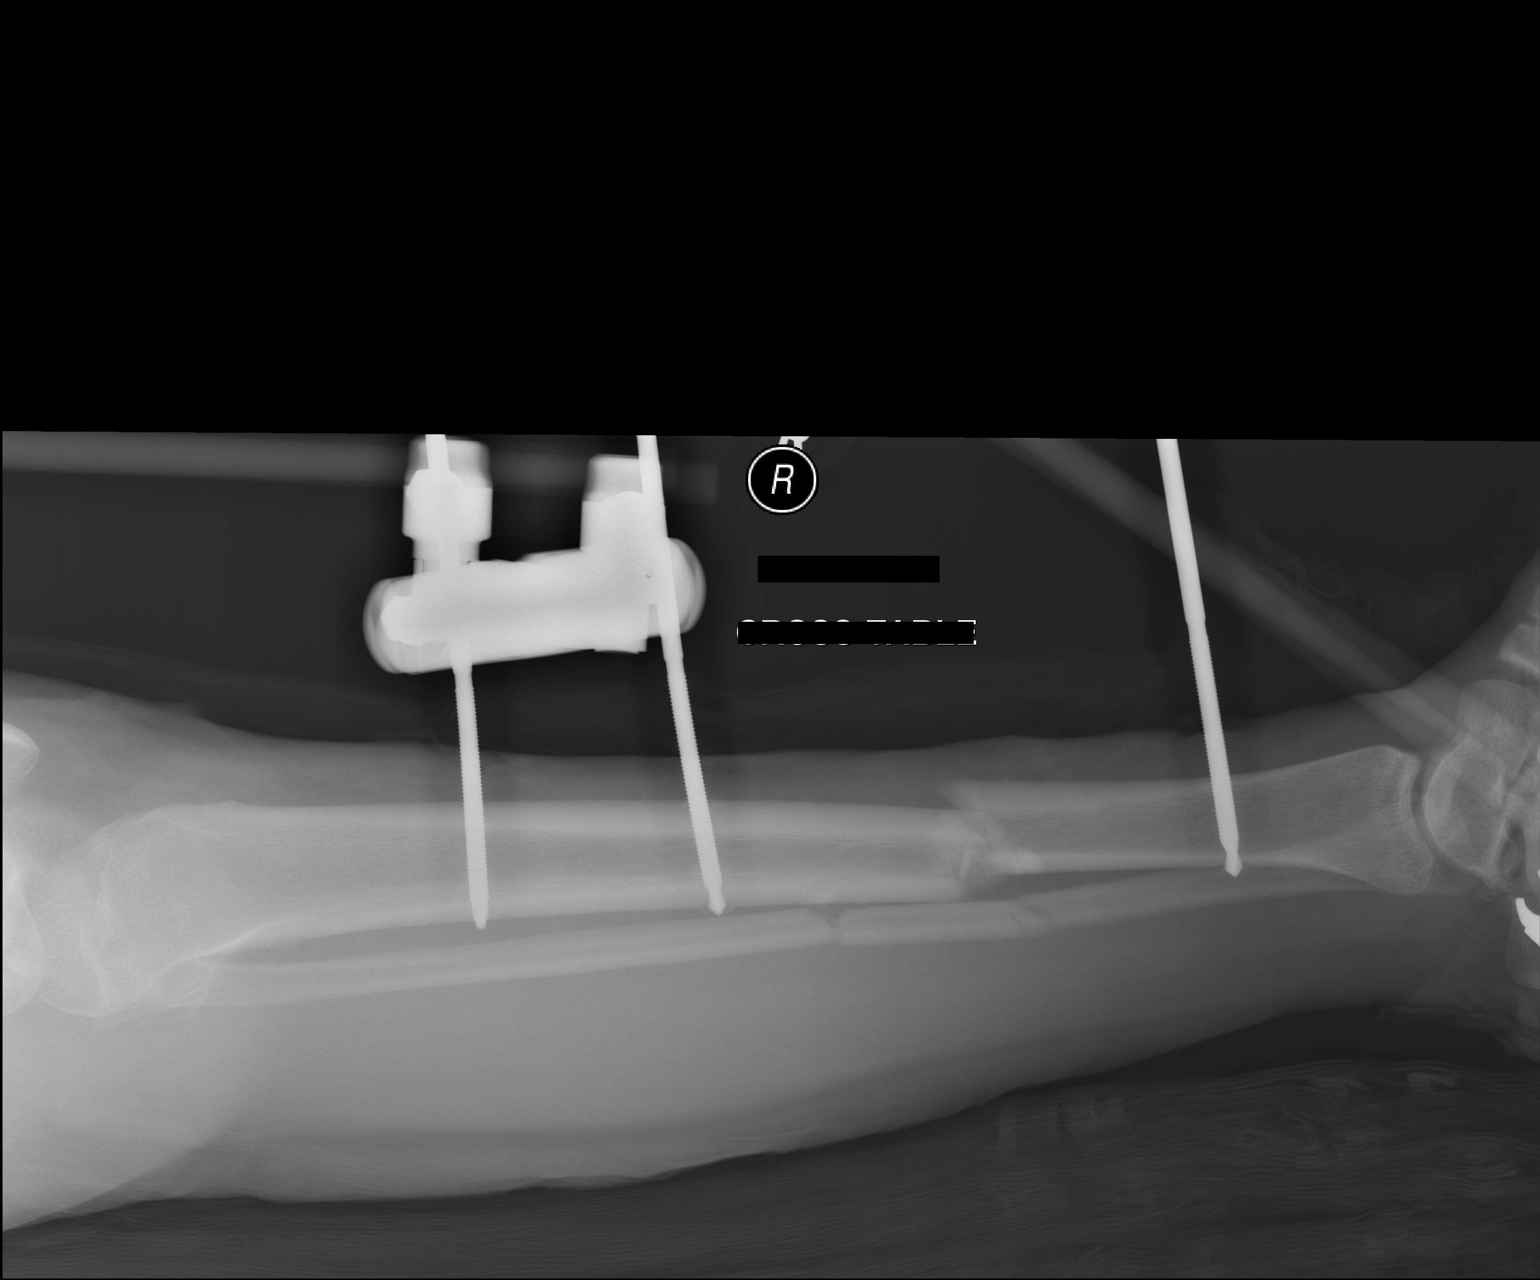

[2 of 2 positions shown; findings below may reference images not displayed]

FINDINGS: Mildly comminuted transverse mid/distal tibial fracture, mildly
displaced.

Segmental distal fibular fracture, minimally displaced.

External fixation of the proximal and distal tibia.
IMPRESSION: Status post external fixation of a mid/distal tibial fracture, as
above.

Segmental distal fibular fracture.

## 2017-01-29 ENCOUNTER — Inpatient Hospital Stay (HOSPITAL_COMMUNITY)
Admission: EM | Admit: 2017-01-29 | Discharge: 2017-01-30 | DRG: 313 | Disposition: A | Discharge status: Home / Self Care | Payer: Self-pay | Attending: Internal Medicine | Admitting: Internal Medicine

## 2017-01-29 ENCOUNTER — Emergency Department (HOSPITAL_COMMUNITY): Payer: Self-pay

## 2017-01-29 ENCOUNTER — Encounter (HOSPITAL_COMMUNITY): Payer: Self-pay | Admitting: Emergency Medicine

## 2017-01-29 DIAGNOSIS — R079 Chest pain, unspecified: Secondary | ICD-10-CM | POA: Diagnosis present

## 2017-01-29 DIAGNOSIS — Z7982 Long term (current) use of aspirin: Secondary | ICD-10-CM

## 2017-01-29 DIAGNOSIS — I10 Essential (primary) hypertension: Secondary | ICD-10-CM

## 2017-01-29 DIAGNOSIS — I252 Old myocardial infarction: Secondary | ICD-10-CM

## 2017-01-29 DIAGNOSIS — I2510 Atherosclerotic heart disease of native coronary artery without angina pectoris: Secondary | ICD-10-CM | POA: Diagnosis present

## 2017-01-29 DIAGNOSIS — J069 Acute upper respiratory infection, unspecified: Secondary | ICD-10-CM | POA: Diagnosis present

## 2017-01-29 DIAGNOSIS — Z955 Presence of coronary angioplasty implant and graft: Secondary | ICD-10-CM

## 2017-01-29 DIAGNOSIS — E8881 Metabolic syndrome: Secondary | ICD-10-CM | POA: Diagnosis present

## 2017-01-29 DIAGNOSIS — J40 Bronchitis, not specified as acute or chronic: Secondary | ICD-10-CM | POA: Diagnosis present

## 2017-01-29 DIAGNOSIS — I5032 Chronic diastolic (congestive) heart failure: Secondary | ICD-10-CM | POA: Diagnosis present

## 2017-01-29 DIAGNOSIS — I110 Hypertensive heart disease with heart failure: Secondary | ICD-10-CM | POA: Diagnosis present

## 2017-01-29 DIAGNOSIS — E785 Hyperlipidemia, unspecified: Secondary | ICD-10-CM | POA: Diagnosis present

## 2017-01-29 DIAGNOSIS — E782 Mixed hyperlipidemia: Secondary | ICD-10-CM | POA: Diagnosis present

## 2017-01-29 DIAGNOSIS — Z885 Allergy status to narcotic agent status: Secondary | ICD-10-CM

## 2017-01-29 DIAGNOSIS — Z79899 Other long term (current) drug therapy: Secondary | ICD-10-CM

## 2017-01-29 DIAGNOSIS — Z87891 Personal history of nicotine dependence: Secondary | ICD-10-CM

## 2017-01-29 DIAGNOSIS — R0789 Other chest pain: Principal | ICD-10-CM | POA: Diagnosis present

## 2017-01-29 DIAGNOSIS — F319 Bipolar disorder, unspecified: Secondary | ICD-10-CM | POA: Diagnosis present

## 2017-01-29 HISTORY — DX: Chronic diastolic (congestive) heart failure: I50.32

## 2017-01-29 LAB — CBC
HCT: 43.4 % (ref 36.0–46.0)
Hemoglobin: 14 g/dL (ref 12.0–15.0)
MCH: 28.7 pg (ref 26.0–34.0)
MCHC: 32.3 g/dL (ref 30.0–36.0)
MCV: 89.1 fL (ref 78.0–100.0)
Platelets: 228 10*3/uL (ref 150–400)
RBC: 4.87 MIL/uL (ref 3.87–5.11)
RDW: 13.9 % (ref 11.5–15.5)
WBC: 5.3 10*3/uL (ref 4.0–10.5)

## 2017-01-29 LAB — BASIC METABOLIC PANEL
Anion gap: 9 (ref 5–15)
BUN: 11 mg/dL (ref 6–20)
CO2: 23 mmol/L (ref 22–32)
Calcium: 9.2 mg/dL (ref 8.9–10.3)
Chloride: 105 mmol/L (ref 101–111)
Creatinine, Ser: 0.62 mg/dL (ref 0.44–1.00)
GFR calc Af Amer: >60 mL/min (ref >60)
GFR calc non Af Amer: >60 mL/min (ref >60)
Glucose, Bld: 84 mg/dL (ref 65–99)
Potassium: 3.7 mmol/L (ref 3.5–5.1)
Sodium: 137 mmol/L (ref 135–145)

## 2017-01-29 LAB — I-STAT BETA HCG BLOOD, ED (MC, WL, AP ONLY): I-stat hCG, quantitative: 6.1 m[IU]/mL — ABNORMAL HIGH (ref <5)

## 2017-01-29 LAB — BRAIN NATRIURETIC PEPTIDE: B Natriuretic Peptide: 42.1 pg/mL (ref 0.0–100.0)

## 2017-01-29 LAB — I-STAT TROPONIN, ED: Troponin i, poc: 0 ng/mL (ref 0.00–0.08)

## 2017-01-29 LAB — HCG, SERUM, QUALITATIVE: Preg, Serum: NEGATIVE

## 2017-01-29 MED ORDER — IPRATROPIUM-ALBUTEROL 0.5-2.5 (3) MG/3ML IN SOLN
3.0000 mL | Freq: Once | RESPIRATORY_TRACT | Status: AC
  Administered 2017-01-29: 3 mL via RESPIRATORY_TRACT
  Filled 2017-01-29: qty 3

## 2017-01-29 MED ORDER — ACETAMINOPHEN 325 MG PO TABS
650.0000 mg | ORAL_TABLET | Freq: Once | ORAL | Status: AC
  Administered 2017-01-29: 650 mg via ORAL
  Filled 2017-01-29: qty 2

## 2017-01-29 MED ORDER — ATORVASTATIN CALCIUM 40 MG PO TABS
40.0000 mg | ORAL_TABLET | Freq: Every day | ORAL | Status: DC
  Administered 2017-01-30: 40 mg via ORAL
  Filled 2017-01-29: qty 1

## 2017-01-29 MED ORDER — DIVALPROEX SODIUM ER 500 MG PO TB24
1000.0000 mg | ORAL_TABLET | Freq: Every day | ORAL | Status: DC
  Administered 2017-01-30: 1000 mg via ORAL
  Filled 2017-01-29: qty 2

## 2017-01-29 MED ORDER — MORPHINE SULFATE (PF) 2 MG/ML IV SOLN: 2.0000 mg | INTRAVENOUS | Status: DC | PRN

## 2017-01-29 MED ORDER — TRIAMTERENE-HCTZ 37.5-25 MG PO TABS
1.0000 | ORAL_TABLET | Freq: Every day | ORAL | Status: DC
  Administered 2017-01-30: 1 via ORAL
  Filled 2017-01-29 (×2): qty 1

## 2017-01-29 MED ORDER — PROMETHAZINE HCL 25 MG/ML IJ SOLN
25.0000 mg | Freq: Once | INTRAMUSCULAR | Status: AC
  Administered 2017-01-29: 25 mg via INTRAVENOUS
  Filled 2017-01-29: qty 1

## 2017-01-29 MED ORDER — ESCITALOPRAM OXALATE 20 MG PO TABS
20.0000 mg | ORAL_TABLET | Freq: Every day | ORAL | Status: DC
  Administered 2017-01-30: 20 mg via ORAL
  Filled 2017-01-29: qty 1

## 2017-01-29 MED ORDER — ENOXAPARIN SODIUM 40 MG/0.4ML ~~LOC~~ SOLN
40.0000 mg | Freq: Every day | SUBCUTANEOUS | Status: DC
  Administered 2017-01-30: 40 mg via SUBCUTANEOUS
  Filled 2017-01-29: qty 0.4

## 2017-01-29 MED ORDER — GI COCKTAIL ~~LOC~~: 30.0000 mL | Freq: Four times a day (QID) | ORAL | Status: DC | PRN

## 2017-01-29 MED ORDER — ONDANSETRON HCL 4 MG/2ML IJ SOLN: 4.0000 mg | Freq: Four times a day (QID) | INTRAMUSCULAR | Status: DC | PRN

## 2017-01-29 MED ORDER — HYDRALAZINE HCL 20 MG/ML IJ SOLN
5.0000 mg | INTRAMUSCULAR | Status: DC | PRN
  Administered 2017-01-30: 5 mg via INTRAVENOUS
  Filled 2017-01-29: qty 1

## 2017-01-29 MED ORDER — METOPROLOL TARTRATE 50 MG PO TABS
50.0000 mg | ORAL_TABLET | Freq: Two times a day (BID) | ORAL | Status: DC
  Administered 2017-01-29 – 2017-01-30 (×2): 50 mg via ORAL
  Filled 2017-01-29: qty 2
  Filled 2017-01-29: qty 1

## 2017-01-29 MED ORDER — ASPIRIN EC 81 MG PO TBEC
81.0000 mg | DELAYED_RELEASE_TABLET | Freq: Every day | ORAL | Status: DC
  Administered 2017-01-29 – 2017-01-30 (×2): 81 mg via ORAL
  Filled 2017-01-29 (×2): qty 1

## 2017-01-29 MED ORDER — ACETAMINOPHEN 325 MG PO TABS: 650.0000 mg | ORAL_TABLET | ORAL | Status: DC | PRN
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
## 2017-01-29 NOTE — ED Notes (Signed)
Attempted report to 3E, unit does not take active chest pain.  Per patient placement pt will need to go to stepdown unit.
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
## 2017-01-29 NOTE — ED Triage Notes (Signed)
Pt states for the past 3 days she has had a productive cough with thick mucus. Pt states today while driving home from work she began having pressure in the center of her chest and into her left arm. Pt states she has two cardiac stents last in 2014. Pt is nervous in triage.

## 2017-01-29 NOTE — ED Notes (Signed)
Patient transported to X-ray 

## 2017-01-30 ENCOUNTER — Encounter (HOSPITAL_COMMUNITY): Payer: Self-pay | Admitting: Physician Assistant

## 2017-01-30 ENCOUNTER — Other Ambulatory Visit: Payer: Self-pay

## 2017-01-30 ENCOUNTER — Observation Stay (HOSPITAL_COMMUNITY): Payer: Self-pay

## 2017-01-30 DIAGNOSIS — R072 Precordial pain: Secondary | ICD-10-CM

## 2017-01-30 DIAGNOSIS — F319 Bipolar disorder, unspecified: Secondary | ICD-10-CM

## 2017-01-30 DIAGNOSIS — R0789 Other chest pain: Principal | ICD-10-CM

## 2017-01-30 DIAGNOSIS — R079 Chest pain, unspecified: Secondary | ICD-10-CM

## 2017-01-30 DIAGNOSIS — I5032 Chronic diastolic (congestive) heart failure: Secondary | ICD-10-CM

## 2017-01-30 DIAGNOSIS — E785 Hyperlipidemia, unspecified: Secondary | ICD-10-CM

## 2017-01-30 DIAGNOSIS — I2510 Atherosclerotic heart disease of native coronary artery without angina pectoris: Secondary | ICD-10-CM

## 2017-01-30 LAB — ECHOCARDIOGRAM COMPLETE
Height: 61 in
Weight: 3027.2 oz

## 2017-01-30 LAB — TROPONIN I
Troponin I: <0.03 ng/mL (ref <0.03)
Troponin I: <0.03 ng/mL (ref <0.03)
Troponin I: <0.03 ng/mL (ref <0.03)

## 2017-01-30 LAB — URINALYSIS, ROUTINE W REFLEX MICROSCOPIC
Bilirubin Urine: NEGATIVE
Glucose, UA: NEGATIVE mg/dL
Hgb urine dipstick: NEGATIVE
Ketones, ur: NEGATIVE mg/dL
Nitrite: NEGATIVE
Protein, ur: NEGATIVE mg/dL
Specific Gravity, Urine: 1.021 (ref 1.005–1.030)
pH: 6 (ref 5.0–8.0)

## 2017-01-30 LAB — RAPID URINE DRUG SCREEN, HOSP PERFORMED
Amphetamines: NOT DETECTED
Barbiturates: NOT DETECTED
Benzodiazepines: NOT DETECTED
Cocaine: NOT DETECTED
Opiates: NOT DETECTED
Tetrahydrocannabinol: NOT DETECTED

## 2017-01-30 LAB — HIV ANTIBODY (ROUTINE TESTING W REFLEX): HIV Screen 4th Generation wRfx: NONREACTIVE

## 2017-01-30 LAB — LIPID PANEL
Cholesterol: 155 mg/dL (ref 0–200)
HDL: 44 mg/dL (ref >40)
LDL Cholesterol: 68 mg/dL (ref 0–99)
Total CHOL/HDL Ratio: 3.5 RATIO
Triglycerides: 214 mg/dL — ABNORMAL HIGH (ref <150)
VLDL: 43 mg/dL — ABNORMAL HIGH (ref 0–40)

## 2017-01-30 LAB — TSH: TSH: 1.605 u[IU]/mL (ref 0.350–4.500)

## 2017-01-30 LAB — D-DIMER, QUANTITATIVE (NOT AT ARMC): D-Dimer, Quant: 0.32 ug/mL-FEU (ref 0.00–0.50)

## 2017-01-30 MED ORDER — ONDANSETRON HCL 4 MG PO TABS: 4.0000 mg | ORAL_TABLET | Freq: Three times a day (TID) | ORAL | 0 refills | Status: DC | PRN

## 2017-01-30 MED ORDER — TRIAMTERENE-HCTZ 37.5-25 MG PO TABS: 1.0000 | ORAL_TABLET | Freq: Every day | ORAL | 3 refills | Status: DC

## 2017-01-30 MED ORDER — PANTOPRAZOLE SODIUM 40 MG PO TBEC
40.0000 mg | DELAYED_RELEASE_TABLET | Freq: Every day | ORAL | Status: DC
  Administered 2017-01-30: 40 mg via ORAL
  Filled 2017-01-30: qty 1

## 2017-01-30 MED ORDER — PANTOPRAZOLE SODIUM 40 MG PO TBEC: 40.0000 mg | DELAYED_RELEASE_TABLET | ORAL | 3 refills | Status: DC

## 2017-01-30 MED ORDER — ALBUTEROL SULFATE (2.5 MG/3ML) 0.083% IN NEBU
2.5000 mg | INHALATION_SOLUTION | RESPIRATORY_TRACT | Status: DC | PRN
Start: 2017-01-30 — End: 2017-01-30

## 2017-01-30 MED ORDER — METOPROLOL TARTRATE 50 MG PO TABS: 50.0000 mg | ORAL_TABLET | Freq: Two times a day (BID) | ORAL | 3 refills | Status: DC

## 2017-01-30 MED ORDER — ISOSORBIDE MONONITRATE ER 30 MG PO TB24: 30.0000 mg | ORAL_TABLET | Freq: Every day | ORAL | 3 refills | Status: DC

## 2017-01-30 MED ORDER — ISOSORBIDE MONONITRATE ER 30 MG PO TB24
30.0000 mg | ORAL_TABLET | Freq: Every day | ORAL | Status: DC
  Administered 2017-01-30: 30 mg via ORAL
  Filled 2017-01-30: qty 1

## 2017-01-30 MED ORDER — ALBUTEROL SULFATE HFA 108 (90 BASE) MCG/ACT IN AERS: 2.0000 | INHALATION_SPRAY | Freq: Four times a day (QID) | RESPIRATORY_TRACT | 2 refills | Status: DC | PRN

## 2017-01-30 MED ORDER — ATORVASTATIN CALCIUM 40 MG PO TABS: 40.0000 mg | ORAL_TABLET | Freq: Every day | ORAL | 3 refills | Status: DC

## 2017-01-30 NOTE — Progress Notes (Signed)
[facility]                                                                              Patient Demographics  [name], is a 51 y.o. female, DOB - 1965-09-09, WUJ:[id number]  Admit date - 01/29/2017   Admitting Physician Jonah Blue, MD  Outpatient Primary MD for the patient is Patient, No Pcp Per  Outpatient specialists:   LOS - 0  days   Medical records reviewed and are as summarized below:    Chief Complaint  Patient presents with  . Chest Pain       Brief summary   Patient is a 51 year old female with CAD, NSTEMI 2012, status post PCI, last cath in 2015, hypertension, hyperlipidemia, alcohol abuse, bipolar disorder presented with chest pain. Patient reported she is recovering from a bad cold with nonproductive cough and congestion.  On the day of admission, reported substernal chest pain while driving, no radiation to the left arm or jaw, felt similar to the prior NSTEMI.  Patient was admitted for further workup   Assessment & Plan    Principal Problem: Atypical chest pain, underlying history of CAD, hypertension, hyperlipidemia -Troponins negative so far, EKG with nonspecific changes -Cardiology consulted, recommended 2D echo and further workup pending echo results.  - Patient had false positive stress test in the past hence no Myoview. -Continue aspirin, beta-blocker, Lipitor -UDS negative, d-dimer negative, TSH 1.60  Active Problems:    Hypertension, uncontrolled -Continue Lopressor, Maxzide -Will add low-dose Imdur 30 mg daily    Mixed hyperlipidemia -Continue Lipitor, has not been taking this medication. -LDL 68, triglycerides 214    Bipolar disorder (HCC) -No SI, HI, continue Lexapro and Depakote    Chronic diastolic CHF (congestive heart failure) (HCC) -Appears to be compensated, prior echo 08/2014 showed EF of 65-70% with grade 2 diastolic dysfunction.  Bronchitis/viral respiratory URI -Improving, continue albuterol inhaler  as needed, symptomatic treatment  Code Status: Full code DVT Prophylaxis:  Lovenox  Family Communication: Discussed in detail with the patient, all imaging results, lab results explained to the patient    Disposition Plan: Await cardiology plan, 2D echo results  Time Spent in minutes 35 minutes  Procedures:    Consultants:   Cardiology  Antimicrobials:      Medications  Scheduled Meds: . aspirin EC  81 mg Oral Daily  . atorvastatin  40 mg Oral Daily  . divalproex  1,000 mg Oral Daily  . enoxaparin (LOVENOX) injection  40 mg Subcutaneous Daily  . escitalopram  20 mg Oral Daily  . metoprolol tartrate  50 mg Oral BID  . pantoprazole  40 mg Oral Q0600  . triamterene-hydrochlorothiazide  1 tablet Oral Daily   Continuous Infusions: PRN Meds:.acetaminophen, albuterol, gi cocktail, hydrALAZINE, morphine injection, ondansetron (ZOFRAN) IV   Antibiotics   Anti-infectives (From admission, onward)   None        Subjective:   [name] was seen and examined today.  Chest pain improved, no shortness of breath.  No fevers or chills. Patient denies dizziness, abdominal pain, N/V/D/C, new weakness, numbess, tingling. No acute events overnight.    Objective:  Vitals:   01/29/17 2300 01/29/17 2342 01/30/17 0350 01/30/17 0755  BP: (!) 168/83 (!) 153/78 (!) 171/87 (!) 135/59  Pulse: 62 (!) 57 (!) 50 (!) 59  Resp:  13 (!) 26 20  Temp: 97.7 F (36.5 C) 97.7 F (36.5 C) (!) 97.4 F (36.3 C) 97.9 F (36.6 C)  TempSrc: Oral Oral Oral Oral  SpO2: 100% 97% 100% 100%  Weight: 86.3 kg (190 lb 3.2 oz)  85.8 kg (189 lb 3.2 oz)   Height: 5\' 1"  (1.549 m)       Intake/Output Summary (Last 24 hours) at 01/30/2017 1016 Last data filed at 01/30/2017 0900 Gross per 24 hour  Intake 0 ml  Output 250 ml  Net -250 ml     Wt Readings from Last 3 Encounters:  01/30/17 85.8 kg (189 lb 3.2 oz)  02/18/16 83.8 kg (184 lb 12.8 oz)  02/11/16 84.1 kg (185 lb 6.4 oz)      Exam  General: Alert and oriented x 3, NAD  Eyes:   HEENT:  Atraumatic, normocephalic, normal oropharynx  Cardiovascular: S1 S2 auscultated, no rubs, murmurs or gallops. Regular rate and rhythm.  Respiratory: Clear to auscultation bilaterally, no wheezing, rales or rhonchi  Gastrointestinal: Soft, nontender, nondistended, + bowel sounds  Ext: no pedal edema bilaterally  Neuro: AAOx3, Cr N's II- XII. Strength 5/5 upper and lower extremities bilaterally, speech clear  Musculoskeletal: No digital cyanosis, clubbing  Skin: No rashes  Psych: Normal affect and demeanor, alert and oriented x3    Data Reviewed:  I have personally reviewed following labs and imaging studies  Micro Results No results found for this or any previous visit (from the past 240 hour(s)).  Radiology Reports Dg Chest 2 View  Result Date: 01/29/2017 CLINICAL DATA:  51 year old female with productive cough for the past 3 days EXAM: CHEST  2 VIEW COMPARISON:  None. FINDINGS: The cardiopericardial silhouette is at the upper limits of normal. Normal mediastinal contours. No focal airspace consolidation. Diffuse mild bronchial wall thickening. No pleural effusion, pulmonary edema or pneumothorax. No acute osseous abnormality. IMPRESSION: 1. Diffuse mild bronchial wall thickening may represent chronic changes, or potentially and acute viral respiratory infection. 2. Borderline cardiomegaly. Electronically Signed   By: Malachy MoanHeath  McCullough M.D.   On: 01/29/2017 16:36    Lab Data:  CBC: Recent Labs  Lab 01/29/17 1535  WBC 5.3  HGB 14.0  HCT 43.4  MCV 89.1  PLT 228   Basic Metabolic Panel: Recent Labs  Lab 01/29/17 1535  NA 137  K 3.7  CL 105  CO2 23  GLUCOSE 84  BUN 11  CREATININE 0.62  CALCIUM 9.2   GFR: Estimated Creatinine Clearance: 83.7 mL/min (by C-G formula based on SCr of 0.62 mg/dL). Liver Function Tests: No results for input(s): AST, ALT, ALKPHOS, BILITOT, PROT, ALBUMIN in the  last 168 hours. No results for input(s): LIPASE, AMYLASE in the last 168 hours. No results for input(s): AMMONIA in the last 168 hours. Coagulation Profile: No results for input(s): INR, PROTIME in the last 168 hours. Cardiac Enzymes: Recent Labs  Lab 01/29/17 2258 01/30/17 0514  TROPONINI <0.03 <0.03   BNP (last 3 results) No results for input(s): PROBNP in the last 8760 hours. HbA1C: No results for input(s): HGBA1C in the last 72 hours. CBG: No results for input(s): GLUCAP in the last 168 hours. Lipid Profile: Recent Labs    01/30/17 0514  CHOL 155  HDL 44  LDLCALC 68  TRIG 214*  CHOLHDL  3.5   Thyroid Function Tests: Recent Labs    01/29/17 2258  TSH 1.605   Anemia Panel: No results for input(s): VITAMINB12, FOLATE, FERRITIN, TIBC, IRON, RETICCTPCT in the last 72 hours. Urine analysis:    Component Value Date/Time   COLORURINE YELLOW 01/29/2017 1937   APPEARANCEUR HAZY (A) 01/29/2017 1937   LABSPEC 1.021 01/29/2017 1937   PHURINE 6.0 01/29/2017 1937   GLUCOSEU NEGATIVE 01/29/2017 1937   HGBUR NEGATIVE 01/29/2017 1937   BILIRUBINUR NEGATIVE 01/29/2017 1937   KETONESUR NEGATIVE 01/29/2017 1937   PROTEINUR NEGATIVE 01/29/2017 1937   UROBILINOGEN 0.2 10/21/2014 1549   NITRITE NEGATIVE 01/29/2017 1937   LEUKOCYTESUR MODERATE (A) 01/29/2017 1937     [name] M.D. [facility] 01/30/2017, 10:16 AM  Pager: [phone] Between 7am to 7pm - call Pager - [phone]  After 7pm go to [email or web address] - password [email or web address]  Call night coverage person covering after 7pm

## 2017-01-30 NOTE — Progress Notes (Signed)
Patient is ready for discharge and has had all questions answered regarding her discharge. Patient is Alert and Oriented and her VS are stable. Patient has all of her belongings. Patient IV has been DC'd and Central Telemetry has been called and patient has been DC'd from telemetry. Patient drove herself to the hospital and would like to drive herself home from the hospital. Patient has agreed to be transported by wheelchair to the ED entrance where she will leave, walk to her car and transport herself home.

## 2017-01-30 NOTE — Discharge Summary (Signed)
Physician Discharge Summary   Patient ID: [name] MRN: [id number] DOB/AGE: [birth year] 51 y.o.  Admit date: 01/29/2017 Discharge date: 01/30/2017  Primary Care Physician:  Patient, No Pcp Per  Discharge Diagnoses:    . Atypical Chest pain . Bipolar disorder (HCC) . CAD (coronary artery disease) . Dyslipidemia . Hypertension . Mixed hyperlipidemia . Chronic diastolic CHF (congestive heart failure) (HCC)   Consults:  cardiology  Recommendations for Outpatient Follow-up:   1. Please repeat CBC/BMET at next visit    DIET:  Heart healthy diet     Allergies:   Allergies  Allergen Reactions  . Codeine Nausea And Vomiting  . Vicodin [Hydrocodone-Acetaminophen] Nausea And Vomiting  . Percocet [Oxycodone-Acetaminophen] Nausea And Vomiting     DISCHARGE MEDICATIONS: Allergies as of 01/30/2017      Reactions   Codeine Nausea And Vomiting   Vicodin [hydrocodone-acetaminophen] Nausea And Vomiting   Percocet [oxycodone-acetaminophen] Nausea And Vomiting      Medication List    TAKE these medications   acetaminophen 325 MG tablet Commonly known as:  TYLENOL Take 650 mg by mouth every 6 (six) hours as needed for mild pain.   albuterol 108 (90 Base) MCG/ACT inhaler Commonly known as:  PROVENTIL HFA;VENTOLIN HFA Inhale 2 puffs into the lungs every 6 (six) hours as needed for wheezing or shortness of breath.   aspirin EC 81 MG tablet Take 1 tablet (81 mg total) by mouth daily.   atorvastatin 40 MG tablet Commonly known as:  LIPITOR Take 1 tablet (40 mg total) by mouth daily.   divalproex 500 MG 24 hr tablet Commonly known as:  DEPAKOTE ER Take 1,000 mg by mouth daily. For mood control   escitalopram 20 MG tablet Commonly known as:  LEXAPRO Take 20 mg by mouth daily.   isosorbide mononitrate 30 MG 24 hr tablet Commonly known as:  IMDUR Take 1 tablet (30 mg total) by mouth daily. Start taking on:  01/31/2017   metoprolol tartrate 50 MG tablet Commonly  known as:  LOPRESSOR Take 1 tablet (50 mg total) by mouth 2 (two) times daily.   ondansetron 4 MG tablet Commonly known as:  ZOFRAN Take 1 tablet (4 mg total) by mouth every 8 (eight) hours as needed for nausea or vomiting.   pantoprazole 40 MG tablet Commonly known as:  PROTONIX Take 1 tablet (40 mg total) by mouth every morning.   triamterene-hydrochlorothiazide 37.5-25 MG tablet Commonly known as:  MAXZIDE-25 Take 1 tablet by mouth daily.        Brief H and P: For complete details please refer to admission H and P, but in brief Patient is a 51 year old female with CAD, NSTEMI 2012, status post PCI, last cath in 2015, hypertension, hyperlipidemia, alcohol abuse, bipolar disorder presented with chest pain. Patient reported she is recovering from a bad cold with nonproductive cough and congestion.  On the day of admission, reported substernal chest pain while driving, no radiation to the left arm or jaw, felt similar to the prior NSTEMI.  Patient was admitted for further workup    Hospital Course:  Atypical chest pain, underlying history of CAD, hypertension, hyperlipidemia -Troponins negative so far, EKG with nonspecific T wave changes -Cardiology consulted, recommended 2D echo.  - Patient had false positive stress test in the past hence no Myoview. -Continue aspirin, beta-blocker, Lipitor -UDS negative, d-dimer negative, TSH 1.60 - 2D echo showed EF 55-60%, no wall motion abnormality, grade 1 diastolic dysfunction - cleared for discharge by cardiology.  - started  on imdur 30mg  daily      Hypertension, uncontrolled -Continue Lopressor, Maxzide -Will add low-dose Imdur 30 mg daily    Mixed hyperlipidemia -Continue Lipitor, has not been taking this medication. -LDL 68, triglycerides 214    Bipolar disorder (HCC) -No SI, HI, continue Lexapro and Depakote    Chronic diastolic CHF (congestive heart failure) (HCC) -Appears to be compensated - echo with EF 50-55%,  grade 1 diastolic dysfunction  Bronchitis/viral respiratory URI -Improving, continue albuterol inhaler as needed, symptomatic treatment     Day of Discharge BP (!) 135/59 (BP Location: Left Arm)   Pulse (!) 59   Temp 97.9 F (36.6 C) (Oral)   Resp 20   Ht 5\' 1"  (1.549 m)   Wt 85.8 kg (189 lb 3.2 oz)   SpO2 100%   BMI 35.75 kg/m   Physical Exam: General: Alert and awake oriented x3 not in any acute distress. HEENT: anicteric sclera, pupils reactive to light and accommodation CVS: S1-S2 clear no murmur rubs or gallops Chest: clear to auscultation bilaterally, no wheezing rales or rhonchi Abdomen: soft nontender, nondistended, normal bowel sounds Extremities: no cyanosis, clubbing or edema noted bilaterally Neuro: Cranial nerves II-XII intact, no focal neurological deficits   The results of significant diagnostics from this hospitalization (including imaging, microbiology, ancillary and laboratory) are listed below for reference.    LAB RESULTS: Basic Metabolic Panel: Recent Labs  Lab 01/29/17 1535  NA 137  K 3.7  CL 105  CO2 23  GLUCOSE 84  BUN 11  CREATININE 0.62  CALCIUM 9.2   Liver Function Tests: No results for input(s): AST, ALT, ALKPHOS, BILITOT, PROT, ALBUMIN in the last 168 hours. No results for input(s): LIPASE, AMYLASE in the last 168 hours. No results for input(s): AMMONIA in the last 168 hours. CBC: Recent Labs  Lab 01/29/17 1535  WBC 5.3  HGB 14.0  HCT 43.4  MCV 89.1  PLT 228   Cardiac Enzymes: Recent Labs  Lab 01/30/17 0514 01/30/17 0904  TROPONINI <0.03 <0.03   BNP: Invalid input(s): POCBNP CBG: No results for input(s): GLUCAP in the last 168 hours.  Significant Diagnostic Studies:  Dg Chest 2 View  Result Date: 01/29/2017 CLINICAL DATA:  51 year old female with productive cough for the past 3 days EXAM: CHEST  2 VIEW COMPARISON:  None. FINDINGS: The cardiopericardial silhouette is at the upper limits of normal. Normal  mediastinal contours. No focal airspace consolidation. Diffuse mild bronchial wall thickening. No pleural effusion, pulmonary edema or pneumothorax. No acute osseous abnormality. IMPRESSION: 1. Diffuse mild bronchial wall thickening may represent chronic changes, or potentially and acute viral respiratory infection. 2. Borderline cardiomegaly. Electronically Signed   By: Malachy MoanHeath  McCullough M.D.   On: 01/29/2017 16:36    2D ECHO: Study Conclusions  - Left ventricle: The cavity size was normal. There was mild focal   basal hypertrophy of the septum. Systolic function was normal.   The estimated ejection fraction was in the range of 55% to 60%.   Wall motion was normal; there were no regional wall motion   abnormalities. Doppler parameters are consistent with abnormal   left ventricular relaxation (grade 1 diastolic dysfunction).  Impressions:  - Normal LV systolic function; mild diastolic dysfunction.  Disposition and Follow-up: Discharge Instructions    Diet - low sodium heart healthy   Complete by:  As directed    Increase activity slowly   Complete by:  As directed        DISPOSITION: home  DISCHARGE FOLLOW-UP Follow-up Information    Lyn RecordsSmith, Henry W, MD. Schedule an appointment as soon as possible for a visit in 2 week(s).   Specialty:  Cardiology Contact information: [address] [place] [address] [phone]            Time spent on Discharge: 25mins   Signed:   Thad Rangeripudeep Rai M.D. [facility] 01/30/2017, 1:47 PM Pager: [phone]
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
## 2017-01-30 NOTE — Progress Notes (Signed)
  Echocardiogram 2D Echocardiogram has been performed.  Leta JunglingCooper, Tiernan Millikin M 01/30/2017, 11:37 AM

## 2017-07-02 ENCOUNTER — Emergency Department (HOSPITAL_COMMUNITY)
Admission: EM | Admit: 2017-07-02 | Discharge: 2017-07-02 | Disposition: A | Discharge status: Home / Self Care | Payer: Self-pay | Attending: Emergency Medicine | Admitting: Emergency Medicine

## 2017-07-02 ENCOUNTER — Emergency Department (HOSPITAL_COMMUNITY): Payer: Self-pay

## 2017-07-02 ENCOUNTER — Encounter (HOSPITAL_COMMUNITY): Payer: Self-pay

## 2017-07-02 ENCOUNTER — Other Ambulatory Visit: Payer: Self-pay

## 2017-07-02 ENCOUNTER — Emergency Department (HOSPITAL_BASED_OUTPATIENT_CLINIC_OR_DEPARTMENT_OTHER)
Admit: 2017-07-02 | Discharge: 2017-07-02 | Disposition: A | Discharge status: Home / Self Care | Payer: Self-pay | Attending: Emergency Medicine | Admitting: Emergency Medicine

## 2017-07-02 DIAGNOSIS — R05 Cough: Secondary | ICD-10-CM | POA: Insufficient documentation

## 2017-07-02 DIAGNOSIS — I110 Hypertensive heart disease with heart failure: Secondary | ICD-10-CM | POA: Insufficient documentation

## 2017-07-02 DIAGNOSIS — M79609 Pain in unspecified limb: Secondary | ICD-10-CM

## 2017-07-02 DIAGNOSIS — Z87891 Personal history of nicotine dependence: Secondary | ICD-10-CM | POA: Insufficient documentation

## 2017-07-02 DIAGNOSIS — Z7982 Long term (current) use of aspirin: Secondary | ICD-10-CM | POA: Insufficient documentation

## 2017-07-02 DIAGNOSIS — M79651 Pain in right thigh: Secondary | ICD-10-CM | POA: Insufficient documentation

## 2017-07-02 DIAGNOSIS — M79662 Pain in left lower leg: Secondary | ICD-10-CM | POA: Insufficient documentation

## 2017-07-02 DIAGNOSIS — I5032 Chronic diastolic (congestive) heart failure: Secondary | ICD-10-CM | POA: Insufficient documentation

## 2017-07-02 DIAGNOSIS — Z79899 Other long term (current) drug therapy: Secondary | ICD-10-CM | POA: Insufficient documentation

## 2017-07-02 DIAGNOSIS — R059 Cough, unspecified: Secondary | ICD-10-CM

## 2017-07-02 DIAGNOSIS — I2510 Atherosclerotic heart disease of native coronary artery without angina pectoris: Secondary | ICD-10-CM | POA: Insufficient documentation

## 2017-07-02 LAB — BASIC METABOLIC PANEL
Anion gap: 10 (ref 5–15)
BUN: 8 mg/dL (ref 6–20)
CO2: 25 mmol/L (ref 22–32)
Calcium: 9.7 mg/dL (ref 8.9–10.3)
Chloride: 107 mmol/L (ref 101–111)
Creatinine, Ser: 0.87 mg/dL (ref 0.44–1.00)
GFR calc Af Amer: >60 mL/min (ref >60)
GFR calc non Af Amer: >60 mL/min (ref >60)
Glucose, Bld: 111 mg/dL — ABNORMAL HIGH (ref 65–99)
Potassium: 3.8 mmol/L (ref 3.5–5.1)
Sodium: 142 mmol/L (ref 135–145)

## 2017-07-02 LAB — CBC
HCT: 45.6 % (ref 36.0–46.0)
Hemoglobin: 14.5 g/dL (ref 12.0–15.0)
MCH: 28 pg (ref 26.0–34.0)
MCHC: 31.8 g/dL (ref 30.0–36.0)
MCV: 88.2 fL (ref 78.0–100.0)
Platelets: 292 10*3/uL (ref 150–400)
RBC: 5.17 MIL/uL — ABNORMAL HIGH (ref 3.87–5.11)
RDW: 13.3 % (ref 11.5–15.5)
WBC: 6.6 10*3/uL (ref 4.0–10.5)

## 2017-07-02 LAB — URINALYSIS, ROUTINE W REFLEX MICROSCOPIC
Bilirubin Urine: NEGATIVE
Glucose, UA: NEGATIVE mg/dL
Hgb urine dipstick: NEGATIVE
Ketones, ur: NEGATIVE mg/dL
Leukocytes, UA: NEGATIVE
Nitrite: NEGATIVE
Protein, ur: NEGATIVE mg/dL
Specific Gravity, Urine: 1.006 (ref 1.005–1.030)
pH: 8 (ref 5.0–8.0)

## 2017-07-02 LAB — PREGNANCY, URINE: Preg Test, Ur: NEGATIVE

## 2017-07-02 MED ORDER — RIVAROXABAN 15 MG PO TABS
15.0000 mg | ORAL_TABLET | Freq: Once | ORAL | Status: AC
  Administered 2017-07-02: 15 mg via ORAL
  Filled 2017-07-02: qty 1

## 2017-07-02 MED ORDER — IPRATROPIUM-ALBUTEROL 0.5-2.5 (3) MG/3ML IN SOLN
3.0000 mL | Freq: Once | RESPIRATORY_TRACT | Status: AC
  Administered 2017-07-02: 3 mL via RESPIRATORY_TRACT
  Filled 2017-07-02: qty 3

## 2017-07-02 MED ORDER — ALBUTEROL SULFATE HFA 108 (90 BASE) MCG/ACT IN AERS
2.0000 | INHALATION_SPRAY | Freq: Once | RESPIRATORY_TRACT | Status: DC
  Filled 2017-07-02: qty 6.7

## 2017-07-02 MED ORDER — PREDNISONE 20 MG PO TABS: ORAL_TABLET | ORAL | 0 refills | Status: DC

## 2017-07-02 NOTE — Progress Notes (Signed)
Preliminary notes--Left lower extremity venous duplex exam completed. Negative for both DVT and SVT.  Distal calf(where patient states the most pain origin) questionable ligament hypoechoic feature, result notified RN [place].  Hongying Jennalee Greaves (RDMS RVT) 07/02/17 9:04 AM

## 2017-07-02 NOTE — ED Notes (Signed)
ED Provider at bedside. 

## 2017-07-02 NOTE — ED Notes (Signed)
Patient transported to X-ray 

## 2017-07-02 NOTE — ED Notes (Signed)
Patient transported to Ultrasound 

## 2017-07-02 NOTE — ED Provider Notes (Signed)
  Physical Exam  BP 136/83   Pulse 74   Temp 98.4 F (36.9 C) (Oral)   Resp 18   SpO2 94%   Physical Exam  ED Course/Procedures     Procedures  MDM  Patient care assumed at sign out. Patient has nonproductive cough. Patient had leg pain as well and had previous rod in the leg. Sign out pending DVT study on L leg, dc home with prednisone, albuterol.   9:09 AM DVT study neg. ? Hypoechoic area of the ligament. She has follow up with ortho. Will dc home with prednisone, albuterol.       Charlynne Pander, MD 07/02/17 [phone]

## 2017-07-02 NOTE — ED Triage Notes (Signed)
Pt states that she has had a cough and cold for the past three days, nonproductve. Pt reports that everyday in the afternoon after 3pm she feels dizzy, and lightheaded, and fatigued for the past week. Pt also c/o of R sided sciatic pain that has been going on for years. Pt also c.o of L calf pain, no redness or swelling.
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
## 2017-07-02 NOTE — Discharge Instructions (Signed)
Take prednisone as prescribed.   Use albuterol as needed for cough.   The ligament in your calf is slightly swollen. Talk to your orthopedic doctor about it.   See your doctor   Return to ER if you have worse leg pain, cough, fever, trouble breathing

## 2018-01-30 ENCOUNTER — Encounter (HOSPITAL_COMMUNITY): Payer: Self-pay

## 2018-01-30 ENCOUNTER — Emergency Department (HOSPITAL_COMMUNITY): Payer: Self-pay

## 2018-01-30 ENCOUNTER — Inpatient Hospital Stay (HOSPITAL_COMMUNITY)
Admission: EM | Admit: 2018-01-30 | Discharge: 2018-02-02 | DRG: 281 | Disposition: A | Discharge status: Home / Self Care | Payer: Self-pay | Attending: Internal Medicine | Admitting: Internal Medicine

## 2018-01-30 ENCOUNTER — Other Ambulatory Visit: Payer: Self-pay

## 2018-01-30 DIAGNOSIS — Z915 Personal history of self-harm: Secondary | ICD-10-CM

## 2018-01-30 DIAGNOSIS — G4733 Obstructive sleep apnea (adult) (pediatric): Secondary | ICD-10-CM | POA: Diagnosis present

## 2018-01-30 DIAGNOSIS — I252 Old myocardial infarction: Secondary | ICD-10-CM

## 2018-01-30 DIAGNOSIS — Z87442 Personal history of urinary calculi: Secondary | ICD-10-CM

## 2018-01-30 DIAGNOSIS — T82855A Stenosis of coronary artery stent, initial encounter: Secondary | ICD-10-CM | POA: Diagnosis present

## 2018-01-30 DIAGNOSIS — Z720 Tobacco use: Secondary | ICD-10-CM | POA: Diagnosis present

## 2018-01-30 DIAGNOSIS — Z803 Family history of malignant neoplasm of breast: Secondary | ICD-10-CM

## 2018-01-30 DIAGNOSIS — R7989 Other specified abnormal findings of blood chemistry: Secondary | ICD-10-CM | POA: Diagnosis present

## 2018-01-30 DIAGNOSIS — I214 Non-ST elevation (NSTEMI) myocardial infarction: Secondary | ICD-10-CM

## 2018-01-30 DIAGNOSIS — I2510 Atherosclerotic heart disease of native coronary artery without angina pectoris: Secondary | ICD-10-CM | POA: Diagnosis present

## 2018-01-30 DIAGNOSIS — Z818 Family history of other mental and behavioral disorders: Secondary | ICD-10-CM

## 2018-01-30 DIAGNOSIS — F17210 Nicotine dependence, cigarettes, uncomplicated: Secondary | ICD-10-CM | POA: Diagnosis present

## 2018-01-30 DIAGNOSIS — K219 Gastro-esophageal reflux disease without esophagitis: Secondary | ICD-10-CM | POA: Diagnosis present

## 2018-01-30 DIAGNOSIS — R778 Other specified abnormalities of plasma proteins: Secondary | ICD-10-CM | POA: Diagnosis present

## 2018-01-30 DIAGNOSIS — E785 Hyperlipidemia, unspecified: Secondary | ICD-10-CM | POA: Diagnosis present

## 2018-01-30 DIAGNOSIS — R079 Chest pain, unspecified: Secondary | ICD-10-CM | POA: Diagnosis present

## 2018-01-30 DIAGNOSIS — Y838 Other surgical procedures as the cause of abnormal reaction of the patient, or of later complication, without mention of misadventure at the time of the procedure: Secondary | ICD-10-CM | POA: Diagnosis present

## 2018-01-30 DIAGNOSIS — I160 Hypertensive urgency: Secondary | ICD-10-CM | POA: Diagnosis present

## 2018-01-30 DIAGNOSIS — F3130 Bipolar disorder, current episode depressed, mild or moderate severity, unspecified: Secondary | ICD-10-CM | POA: Diagnosis present

## 2018-01-30 DIAGNOSIS — I161 Hypertensive emergency: Principal | ICD-10-CM | POA: Diagnosis present

## 2018-01-30 DIAGNOSIS — Z801 Family history of malignant neoplasm of trachea, bronchus and lung: Secondary | ICD-10-CM

## 2018-01-30 DIAGNOSIS — Z981 Arthrodesis status: Secondary | ICD-10-CM

## 2018-01-30 DIAGNOSIS — Z8249 Family history of ischemic heart disease and other diseases of the circulatory system: Secondary | ICD-10-CM

## 2018-01-30 DIAGNOSIS — Z79899 Other long term (current) drug therapy: Secondary | ICD-10-CM

## 2018-01-30 DIAGNOSIS — F419 Anxiety disorder, unspecified: Secondary | ICD-10-CM | POA: Diagnosis present

## 2018-01-30 DIAGNOSIS — Z7982 Long term (current) use of aspirin: Secondary | ICD-10-CM

## 2018-01-30 DIAGNOSIS — Z9851 Tubal ligation status: Secondary | ICD-10-CM

## 2018-01-30 DIAGNOSIS — Z91040 Latex allergy status: Secondary | ICD-10-CM

## 2018-01-30 DIAGNOSIS — Z885 Allergy status to narcotic agent status: Secondary | ICD-10-CM

## 2018-01-30 DIAGNOSIS — F3131 Bipolar disorder, current episode depressed, mild: Secondary | ICD-10-CM

## 2018-01-30 DIAGNOSIS — I5032 Chronic diastolic (congestive) heart failure: Secondary | ICD-10-CM | POA: Diagnosis present

## 2018-01-30 DIAGNOSIS — Z9119 Patient's noncompliance with other medical treatment and regimen: Secondary | ICD-10-CM

## 2018-01-30 DIAGNOSIS — Z955 Presence of coronary angioplasty implant and graft: Secondary | ICD-10-CM

## 2018-01-30 DIAGNOSIS — I10 Essential (primary) hypertension: Secondary | ICD-10-CM | POA: Diagnosis present

## 2018-01-30 DIAGNOSIS — F1010 Alcohol abuse, uncomplicated: Secondary | ICD-10-CM | POA: Diagnosis present

## 2018-01-30 DIAGNOSIS — Z9114 Patient's other noncompliance with medication regimen: Secondary | ICD-10-CM

## 2018-01-30 DIAGNOSIS — F319 Bipolar disorder, unspecified: Secondary | ICD-10-CM | POA: Diagnosis present

## 2018-01-30 DIAGNOSIS — Z7952 Long term (current) use of systemic steroids: Secondary | ICD-10-CM

## 2018-01-30 DIAGNOSIS — I248 Other forms of acute ischemic heart disease: Secondary | ICD-10-CM | POA: Diagnosis present

## 2018-01-30 DIAGNOSIS — I110 Hypertensive heart disease with heart failure: Secondary | ICD-10-CM | POA: Diagnosis present

## 2018-01-30 HISTORY — DX: Personal history of urinary calculi: Z87.442

## 2018-01-30 HISTORY — DX: Anxiety disorder, unspecified: F41.9

## 2018-01-30 HISTORY — DX: Major depressive disorder, single episode, unspecified: F32.9

## 2018-01-30 HISTORY — DX: Depression, unspecified: F32.A

## 2018-01-30 HISTORY — DX: Non-ST elevation (NSTEMI) myocardial infarction: I21.4

## 2018-01-30 HISTORY — DX: Alcohol abuse, uncomplicated: F10.10

## 2018-01-30 LAB — CBC
HCT: 50.2 % — ABNORMAL HIGH (ref 36.0–46.0)
Hemoglobin: 15.7 g/dL — ABNORMAL HIGH (ref 12.0–15.0)
MCH: 28.6 pg (ref 26.0–34.0)
MCHC: 31.3 g/dL (ref 30.0–36.0)
MCV: 91.4 fL (ref 80.0–100.0)
Platelets: 296 10*3/uL (ref 150–400)
RBC: 5.49 MIL/uL — ABNORMAL HIGH (ref 3.87–5.11)
RDW: 14 % (ref 11.5–15.5)
WBC: 5.6 10*3/uL (ref 4.0–10.5)
nRBC: 0 % (ref 0.0–0.2)

## 2018-01-30 LAB — I-STAT BETA HCG BLOOD, ED (MC, WL, AP ONLY): I-stat hCG, quantitative: <5 m[IU]/mL (ref <5)

## 2018-01-30 LAB — BASIC METABOLIC PANEL
Anion gap: 11 (ref 5–15)
BUN: 11 mg/dL (ref 6–20)
CO2: 23 mmol/L (ref 22–32)
Calcium: 9.4 mg/dL (ref 8.9–10.3)
Chloride: 109 mmol/L (ref 98–111)
Creatinine, Ser: 0.94 mg/dL (ref 0.44–1.00)
GFR calc Af Amer: >60 mL/min (ref >60)
GFR calc non Af Amer: >60 mL/min (ref >60)
Glucose, Bld: 117 mg/dL — ABNORMAL HIGH (ref 70–99)
Potassium: 4 mmol/L (ref 3.5–5.1)
Sodium: 143 mmol/L (ref 135–145)

## 2018-01-30 LAB — I-STAT TROPONIN, ED: Troponin i, poc: 0.16 ng/mL (ref 0.00–0.08)

## 2018-01-30 MED ORDER — LORAZEPAM 1 MG PO TABS: 0.0000 mg | ORAL_TABLET | Freq: Two times a day (BID) | ORAL | Status: DC

## 2018-01-30 MED ORDER — THIAMINE HCL 100 MG/ML IJ SOLN
100.0000 mg | Freq: Every day | INTRAMUSCULAR | Status: DC
  Administered 2018-01-30: 100 mg via INTRAVENOUS
  Filled 2018-01-30 (×2): qty 2

## 2018-01-30 MED ORDER — LORAZEPAM 1 MG PO TABS
0.0000 mg | ORAL_TABLET | Freq: Four times a day (QID) | ORAL | Status: AC
  Administered 2018-01-31 – 2018-02-01 (×2): 1 mg via ORAL
  Administered 2018-02-01: 2 mg via ORAL
  Filled 2018-01-30 (×2): qty 1
  Filled 2018-01-30: qty 2

## 2018-01-30 MED ORDER — TRIAMTERENE-HCTZ 37.5-25 MG PO TABS
1.0000 | ORAL_TABLET | Freq: Every day | ORAL | Status: DC
  Administered 2018-01-30 – 2018-02-02 (×4): 1 via ORAL
  Filled 2018-01-30 (×4): qty 1

## 2018-01-30 MED ORDER — ACETAMINOPHEN 325 MG PO TABS
650.0000 mg | ORAL_TABLET | Freq: Four times a day (QID) | ORAL | Status: DC | PRN
  Administered 2018-02-01 – 2018-02-02 (×2): 650 mg via ORAL
  Filled 2018-01-30 (×2): qty 2

## 2018-01-30 MED ORDER — PANTOPRAZOLE SODIUM 40 MG PO TBEC
40.0000 mg | DELAYED_RELEASE_TABLET | ORAL | Status: DC
  Administered 2018-01-31 – 2018-02-02 (×3): 40 mg via ORAL
  Filled 2018-01-30 (×3): qty 1

## 2018-01-30 MED ORDER — ONDANSETRON HCL 4 MG/2ML IJ SOLN
4.0000 mg | Freq: Three times a day (TID) | INTRAMUSCULAR | Status: DC | PRN
  Administered 2018-02-02: 4 mg via INTRAVENOUS
  Filled 2018-01-30: qty 2

## 2018-01-30 MED ORDER — HEPARIN BOLUS VIA INFUSION
4000.0000 [IU] | Freq: Once | INTRAVENOUS | Status: AC
  Administered 2018-01-30: 4000 [IU] via INTRAVENOUS
  Filled 2018-01-30: qty 4000

## 2018-01-30 MED ORDER — ALBUTEROL SULFATE HFA 108 (90 BASE) MCG/ACT IN AERS: 2.0000 | INHALATION_SPRAY | Freq: Four times a day (QID) | RESPIRATORY_TRACT | Status: DC | PRN

## 2018-01-30 MED ORDER — LORAZEPAM 2 MG/ML IJ SOLN
0.0000 mg | Freq: Four times a day (QID) | INTRAMUSCULAR | Status: AC
  Administered 2018-01-31 – 2018-02-01 (×3): 1 mg via INTRAVENOUS
  Filled 2018-01-30 (×3): qty 1

## 2018-01-30 MED ORDER — ATORVASTATIN CALCIUM 40 MG PO TABS: 40.0000 mg | ORAL_TABLET | Freq: Every day | ORAL | Status: DC

## 2018-01-30 MED ORDER — LORAZEPAM 2 MG/ML IJ SOLN
0.0000 mg | Freq: Two times a day (BID) | INTRAMUSCULAR | Status: DC
  Administered 2018-02-02: 2 mg via INTRAVENOUS
  Filled 2018-01-30: qty 1

## 2018-01-30 MED ORDER — DIVALPROEX SODIUM ER 500 MG PO TB24
1000.0000 mg | ORAL_TABLET | Freq: Every day | ORAL | Status: DC
  Administered 2018-01-31 – 2018-02-01 (×2): 1000 mg via ORAL
  Filled 2018-01-30 (×3): qty 2

## 2018-01-30 MED ORDER — SODIUM CHLORIDE 0.9 % IV BOLUS
500.0000 mL | Freq: Once | INTRAVENOUS | Status: AC
  Administered 2018-01-30: 500 mL via INTRAVENOUS

## 2018-01-30 MED ORDER — NITROGLYCERIN IN D5W 200-5 MCG/ML-% IV SOLN
0.0000 ug/min | INTRAVENOUS | Status: DC
  Administered 2018-01-30: 5 ug/min via INTRAVENOUS
  Filled 2018-01-30: qty 250

## 2018-01-30 MED ORDER — METOPROLOL TARTRATE 50 MG PO TABS
50.0000 mg | ORAL_TABLET | Freq: Two times a day (BID) | ORAL | Status: DC
  Administered 2018-01-30 – 2018-01-31 (×2): 50 mg via ORAL
  Filled 2018-01-30: qty 2
  Filled 2018-01-30: qty 1

## 2018-01-30 MED ORDER — ALBUTEROL SULFATE (2.5 MG/3ML) 0.083% IN NEBU: 2.5000 mg | INHALATION_SOLUTION | Freq: Four times a day (QID) | RESPIRATORY_TRACT | Status: DC | PRN

## 2018-01-30 MED ORDER — NITROGLYCERIN 0.4 MG SL SUBL
0.4000 mg | SUBLINGUAL_TABLET | SUBLINGUAL | Status: DC | PRN
  Filled 2018-01-30: qty 1

## 2018-01-30 MED ORDER — ASPIRIN EC 325 MG PO TBEC
325.0000 mg | DELAYED_RELEASE_TABLET | Freq: Every day | ORAL | Status: DC
  Administered 2018-01-30: 325 mg via ORAL
  Filled 2018-01-30: qty 1

## 2018-01-30 MED ORDER — ZOLPIDEM TARTRATE 5 MG PO TABS
5.0000 mg | ORAL_TABLET | Freq: Every evening | ORAL | Status: DC | PRN
  Administered 2018-01-31 – 2018-02-01 (×3): 5 mg via ORAL
  Filled 2018-01-30 (×3): qty 1

## 2018-01-30 MED ORDER — ASPIRIN 81 MG PO CHEW
324.0000 mg | CHEWABLE_TABLET | Freq: Once | ORAL | Status: DC
  Filled 2018-01-30: qty 4

## 2018-01-30 MED ORDER — HEPARIN SODIUM (PORCINE) 5000 UNIT/ML IJ SOLN: 5000.0000 [IU] | Freq: Three times a day (TID) | INTRAMUSCULAR | Status: DC

## 2018-01-30 MED ORDER — VITAMIN B-1 100 MG PO TABS
100.0000 mg | ORAL_TABLET | Freq: Every day | ORAL | Status: DC
  Administered 2018-01-31 – 2018-02-02 (×3): 100 mg via ORAL
  Filled 2018-01-30 (×3): qty 1

## 2018-01-30 MED ORDER — HEPARIN (PORCINE) 25000 UT/250ML-% IV SOLN
850.0000 [IU]/h | INTRAVENOUS | Status: DC
  Administered 2018-01-30: 850 [IU]/h via INTRAVENOUS
  Filled 2018-01-30: qty 250

## 2018-01-30 MED ORDER — HYDRALAZINE HCL 20 MG/ML IJ SOLN
5.0000 mg | INTRAMUSCULAR | Status: DC | PRN
  Administered 2018-01-31 – 2018-02-01 (×2): 5 mg via INTRAVENOUS
  Filled 2018-01-30 (×2): qty 1

## 2018-01-30 MED ORDER — ESCITALOPRAM OXALATE 10 MG PO TABS
20.0000 mg | ORAL_TABLET | Freq: Every day | ORAL | Status: DC
  Administered 2018-01-31 – 2018-02-02 (×3): 20 mg via ORAL
  Filled 2018-01-30 (×5): qty 2
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
## 2018-01-30 NOTE — ED Notes (Signed)
Arlys JohnBrian RN and CBS CorporationEmily Charge RN notified of elevated Trop
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
## 2018-01-30 NOTE — ED Triage Notes (Signed)
Pt her for chest pain and shortness of breath for the last 2 days.  Hx of MI in 2012 and quit smoking and drinking ETOH 2 days ago.  No headache, little nausea at this time.
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
## 2018-01-31 ENCOUNTER — Observation Stay (HOSPITAL_BASED_OUTPATIENT_CLINIC_OR_DEPARTMENT_OTHER): Payer: Self-pay

## 2018-01-31 ENCOUNTER — Other Ambulatory Visit: Payer: Self-pay

## 2018-01-31 ENCOUNTER — Observation Stay (HOSPITAL_COMMUNITY): Payer: Self-pay

## 2018-01-31 ENCOUNTER — Encounter (HOSPITAL_COMMUNITY): Payer: Self-pay | Admitting: *Deleted

## 2018-01-31 DIAGNOSIS — I248 Other forms of acute ischemic heart disease: Secondary | ICD-10-CM

## 2018-01-31 DIAGNOSIS — I10 Essential (primary) hypertension: Secondary | ICD-10-CM

## 2018-01-31 DIAGNOSIS — E782 Mixed hyperlipidemia: Secondary | ICD-10-CM

## 2018-01-31 DIAGNOSIS — R55 Syncope and collapse: Secondary | ICD-10-CM

## 2018-01-31 DIAGNOSIS — I2510 Atherosclerotic heart disease of native coronary artery without angina pectoris: Secondary | ICD-10-CM

## 2018-01-31 DIAGNOSIS — I5032 Chronic diastolic (congestive) heart failure: Secondary | ICD-10-CM

## 2018-01-31 DIAGNOSIS — I25119 Atherosclerotic heart disease of native coronary artery with unspecified angina pectoris: Secondary | ICD-10-CM

## 2018-01-31 DIAGNOSIS — R7989 Other specified abnormal findings of blood chemistry: Secondary | ICD-10-CM

## 2018-01-31 LAB — LIPID PANEL
Cholesterol: 135 mg/dL (ref 0–200)
HDL: 41 mg/dL (ref >40)
LDL Cholesterol: 43 mg/dL (ref 0–99)
Total CHOL/HDL Ratio: 3.3 RATIO
Triglycerides: 256 mg/dL — ABNORMAL HIGH (ref <150)
VLDL: 51 mg/dL — ABNORMAL HIGH (ref 0–40)

## 2018-01-31 LAB — CBC
HCT: 46.6 % — ABNORMAL HIGH (ref 36.0–46.0)
Hemoglobin: 14.5 g/dL (ref 12.0–15.0)
MCH: 28.2 pg (ref 26.0–34.0)
MCHC: 31.1 g/dL (ref 30.0–36.0)
MCV: 90.5 fL (ref 80.0–100.0)
Platelets: 273 10*3/uL (ref 150–400)
RBC: 5.15 MIL/uL — ABNORMAL HIGH (ref 3.87–5.11)
RDW: 14 % (ref 11.5–15.5)
WBC: 4.6 10*3/uL (ref 4.0–10.5)
nRBC: 0 % (ref 0.0–0.2)

## 2018-01-31 LAB — NM MYOCAR MULTI W/SPECT W/WALL MOTION / EF
Estimated workload: 1 METS
Exercise duration (min): 0 min
Exercise duration (sec): 0 s
Peak HR: 93 {beats}/min
Rest HR: 60 {beats}/min

## 2018-01-31 LAB — CREATININE, SERUM
Creatinine, Ser: 0.8 mg/dL (ref 0.44–1.00)
GFR calc Af Amer: >60 mL/min (ref >60)
GFR calc non Af Amer: >60 mL/min (ref >60)

## 2018-01-31 LAB — HEPARIN LEVEL (UNFRACTIONATED)
Heparin Unfractionated: <0.1 IU/mL — ABNORMAL LOW (ref 0.30–0.70)
Heparin Unfractionated: 1.02 IU/mL — ABNORMAL HIGH (ref 0.30–0.70)
Heparin Unfractionated: 0.25 IU/mL — ABNORMAL LOW (ref 0.30–0.70)

## 2018-01-31 LAB — RAPID URINE DRUG SCREEN, HOSP PERFORMED
Amphetamines: NOT DETECTED
Barbiturates: NOT DETECTED
Benzodiazepines: NOT DETECTED
Cocaine: NOT DETECTED
Opiates: NOT DETECTED
Tetrahydrocannabinol: NOT DETECTED

## 2018-01-31 LAB — HEMOGLOBIN A1C
Hgb A1c MFr Bld: 5.4 % (ref 4.8–5.6)
Mean Plasma Glucose: 108.28 mg/dL

## 2018-01-31 LAB — TROPONIN I
Troponin I: 0.23 ng/mL (ref <0.03)
Troponin I: 0.29 ng/mL (ref <0.03)
Troponin I: 0.31 ng/mL (ref <0.03)

## 2018-01-31 LAB — ECHOCARDIOGRAM COMPLETE: Weight: 2742.4 oz

## 2018-01-31 LAB — HIV ANTIBODY (ROUTINE TESTING W REFLEX): HIV Screen 4th Generation wRfx: NONREACTIVE

## 2018-01-31 MED ORDER — TECHNETIUM TC 99M TETROFOSMIN IV KIT
10.0000 | PACK | Freq: Once | INTRAVENOUS | Status: AC | PRN
  Administered 2018-01-31: 10 via INTRAVENOUS

## 2018-01-31 MED ORDER — ATORVASTATIN CALCIUM 80 MG PO TABS
80.0000 mg | ORAL_TABLET | Freq: Every day | ORAL | Status: DC
  Administered 2018-01-31 – 2018-02-02 (×3): 80 mg via ORAL
  Filled 2018-01-31 (×3): qty 1

## 2018-01-31 MED ORDER — HEPARIN BOLUS VIA INFUSION
4000.0000 [IU] | Freq: Once | INTRAVENOUS | Status: AC
  Administered 2018-01-31: 4000 [IU] via INTRAVENOUS
  Filled 2018-01-31: qty 4000

## 2018-01-31 MED ORDER — LISINOPRIL 2.5 MG PO TABS: 2.5000 mg | ORAL_TABLET | Freq: Every day | ORAL | Status: DC

## 2018-01-31 MED ORDER — NITROGLYCERIN 0.4 MG SL SUBL: 0.4000 mg | SUBLINGUAL_TABLET | SUBLINGUAL | Status: DC | PRN

## 2018-01-31 MED ORDER — PNEUMOCOCCAL VAC POLYVALENT 25 MCG/0.5ML IJ INJ: 0.5000 mL | INJECTION | INTRAMUSCULAR | Status: DC

## 2018-01-31 MED ORDER — HEPARIN (PORCINE) 25000 UT/250ML-% IV SOLN
850.0000 [IU]/h | INTRAVENOUS | Status: DC
  Administered 2018-01-31: 700 [IU]/h via INTRAVENOUS
  Filled 2018-01-31: qty 250

## 2018-01-31 MED ORDER — REGADENOSON 0.4 MG/5ML IV SOLN
0.4000 mg | Freq: Once | INTRAVENOUS | Status: AC
  Administered 2018-01-31: 0.4 mg via INTRAVENOUS
  Filled 2018-01-31: qty 5

## 2018-01-31 MED ORDER — TECHNETIUM TC 99M TETROFOSMIN IV KIT
30.0000 | PACK | Freq: Once | INTRAVENOUS | Status: AC | PRN
  Administered 2018-01-31: 30 via INTRAVENOUS

## 2018-01-31 MED ORDER — ALUM & MAG HYDROXIDE-SIMETH 200-200-20 MG/5ML PO SUSP: 30.0000 mL | ORAL | Status: DC | PRN

## 2018-01-31 MED ORDER — CARVEDILOL 6.25 MG PO TABS
6.2500 mg | ORAL_TABLET | Freq: Two times a day (BID) | ORAL | Status: DC
  Administered 2018-01-31 (×2): 6.25 mg via ORAL
  Filled 2018-01-31 (×2): qty 1

## 2018-01-31 MED ORDER — REGADENOSON 0.4 MG/5ML IV SOLN
INTRAVENOUS | Status: AC
  Administered 2018-01-31: 0.4 mg via INTRAVENOUS
  Filled 2018-01-31: qty 5

## 2018-01-31 MED ORDER — NICOTINE 21 MG/24HR TD PT24
21.0000 mg | MEDICATED_PATCH | Freq: Every day | TRANSDERMAL | Status: DC
  Administered 2018-01-31 – 2018-02-02 (×3): 21 mg via TRANSDERMAL
  Filled 2018-01-31 (×3): qty 1

## 2018-01-31 MED ORDER — ASPIRIN EC 81 MG PO TBEC
81.0000 mg | DELAYED_RELEASE_TABLET | Freq: Every day | ORAL | Status: DC
  Administered 2018-01-31 – 2018-02-01 (×2): 81 mg via ORAL
  Filled 2018-01-31 (×2): qty 1

## 2018-01-31 MED ORDER — HEPARIN (PORCINE) 25000 UT/250ML-% IV SOLN
950.0000 [IU]/h | INTRAVENOUS | Status: DC
  Administered 2018-01-31: 950 [IU]/h via INTRAVENOUS
  Filled 2018-01-31: qty 250

## 2018-01-31 MED ORDER — LOSARTAN POTASSIUM 25 MG PO TABS
25.0000 mg | ORAL_TABLET | Freq: Every day | ORAL | Status: DC
  Administered 2018-01-31: 25 mg via ORAL
  Filled 2018-01-31: qty 1

## 2018-01-31 NOTE — Progress Notes (Addendum)
Cardiology PM Rounds:  Stress test is abnormal with possible ischemia. She will need cardiac catheterization before discharge. I have discussed this with her today. Will plan to continue IV heparin today. Cardiac cath on Thursday. She can eat today. I will order the diet.   Verne CarrowChristopher McAlhany 01/31/2018 4:26 PM

## 2018-01-31 NOTE — Progress Notes (Signed)
ANTICOAGULATION CONSULT NOTE  Pharmacy Consult for heparin Indication: chest pain/ACS  Allergies  Allergen Reactions  . Codeine Nausea And Vomiting  . Percocet [Oxycodone-Acetaminophen] Nausea And Vomiting  . Vicodin [Hydrocodone-Acetaminophen] Nausea And Vomiting  . Latex Itching    Reaction to powder in latex gloves    Patient Measurements: Weight: 171 lb 12.8 oz (77.9 kg) Heparin Dosing Weight: 70kg  Vital Signs: Temp: 97.8 F (36.6 C) (12/23 2358) Temp Source: Oral (12/23 2358) BP: 142/66 (12/23 2358) Pulse Rate: 64 (12/23 2358)  Labs: Recent Labs    01/30/18 1804 01/30/18 2318 01/31/18 0314  HGB 15.7*  --  14.5  HCT 50.2*  --  46.6*  PLT 296  --  273  CREATININE 0.94 0.80  --   TROPONINI  --  0.23*  --     CrCl cannot be calculated (Unknown ideal weight.).  Assessment: 52 y.o. female with chest pain and elevated cardiac markers for heparin.  Heparin started in ED ~ 8 pm, but d/c'd shortly after.  Will restart now   Goal of Therapy:  Heparin level 0.3-0.7 units/ml Monitor platelets by anticoagulation protocol: Yes   Plan:  Heparin 4000 units IV bolus, then start heparin 950 units/hr Check heparin level in 6 hours.   Geannie RisenGreg Lindamarie Maclachlan, PharmD, BCPS  01/31/2018 4:32 AM

## 2018-01-31 NOTE — Sedation Documentation (Signed)
Patient in stress lab for Nuclear Med Lexiscan test. IV Heparin line connected to IV but pump is not turned on on arrival to stress lab. MAR checked and medication was held at 1110 by inpatient nurse.

## 2018-01-31 NOTE — Progress Notes (Addendum)
ANTICOAGULATION CONSULT NOTE  Pharmacy Consult for heparin Indication: chest pain/ACS  Allergies  Allergen Reactions  . Codeine Nausea And Vomiting  . Percocet [Oxycodone-Acetaminophen] Nausea And Vomiting  . Vicodin [Hydrocodone-Acetaminophen] Nausea And Vomiting  . Latex Itching    Reaction to powder in latex gloves    Patient Measurements: Weight: 171 lb 6.4 oz (77.7 kg) Heparin Dosing Weight: 70kg  Vital Signs: Temp: 98.4 F (36.9 C) (12/24 0438) Temp Source: Oral (12/24 0438) BP: 174/92 (12/24 1016) Pulse Rate: 59 (12/24 0815)  Labs: Recent Labs    01/30/18 1804 01/30/18 2318 01/31/18 0314 01/31/18 0939  HGB 15.7*  --  14.5  --   HCT 50.2*  --  46.6*  --   PLT 296  --  273  --   HEPARINUNFRC  --   --  <0.10* 1.02*  CREATININE 0.94 0.80  --   --   TROPONINI  --  0.23* 0.29* 0.31*    CrCl cannot be calculated (Unknown ideal weight.).  Assessment: 52 y.o. female with chest pain and elevated cardiac markers for heparin.  Heparin started in ED ~ 8 pm, but d/c'd shortly after.    Heparin level this morning 1.0, no bleeding issues noted. Lab drawn opposite arm per nurse. Instructed nurse to hold infusion for 1 hour then will restart at lower rate. Hgb down slightly from 15.7 > 14.5 this morning.  Stress test today.    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    Goal of Therapy:  Heparin level 0.3-0.7 units/ml Monitor platelets by anticoagulation protocol: Yes   Plan:  Hold heparin for 1 hour Restart at 700 units/hr Recheck in 6 hours  Sheppard CoilFrank Tylisa Alcivar PharmD., BCPS Clinical Pharmacist 01/31/2018 11:13 AM

## 2018-01-31 NOTE — Progress Notes (Signed)
ANTICOAGULATION CONSULT NOTE  Pharmacy Consult for heparin Indication: chest pain/ACS  Allergies  Allergen Reactions  . Codeine Nausea And Vomiting  . Percocet [Oxycodone-Acetaminophen] Nausea And Vomiting  . Vicodin [Hydrocodone-Acetaminophen] Nausea And Vomiting  . Latex Itching    Reaction to powder in latex gloves    Patient Measurements: Weight: 171 lb 6.4 oz (77.7 kg) Heparin Dosing Weight: 70kg  Vital Signs: Temp: 97.7 F (36.5 C) (12/24 1932) Temp Source: Axillary (12/24 1932) BP: 165/89 (12/24 1932) Pulse Rate: 74 (12/24 1932)  Labs: Recent Labs    01/30/18 1804 01/30/18 2318 01/31/18 0314 01/31/18 0939 01/31/18 2005  HGB 15.7*  --  14.5  --   --   HCT 50.2*  --  46.6*  --   --   PLT 296  --  273  --   --   HEPARINUNFRC  --   --  <0.10* 1.02* 0.25*  CREATININE 0.94 0.80  --   --   --   TROPONINI  --  0.23* 0.29* 0.31*  --     CrCl cannot be calculated (Unknown ideal weight.).  Assessment: 52 y.o. female with chest pain and elevated cardiac markers for heparin.  Heparin started in ED ~ 8 pm, but d/c'd shortly after.  Now s/p stress test that shows possible ischemia and plan for cath on Thursday.     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   Initial heparin level subtherapeutic at 0.25 s/p restart at 700 units/hr.  H/H low but stable, plts wnl.  No line issues per RN report.     Goal of Therapy:  Heparin level 0.3-0.7 units/ml Monitor platelets by anticoagulation protocol: Yes   Plan:  Increase heparin to 850 units/hr F/u 6 hour heparin level  Daylene PoseyJonathan Vamsi Apfel, PharmD Clinical Pharmacist Please check AMION for all [facility] 01/31/2018 8:33 PM

## 2018-01-31 NOTE — Progress Notes (Signed)
PROGRESS NOTE        PATIENT DETAILS Name: Janet [name] Age: 52 y.o. Sex: female Date of Birth: 1966/02/03 Admit Date: 01/30/2018 Admitting Physician Lorretta HarpXilin Niu, MD WUJ:[id number]:Patient, No Pcp Per  Brief Narrative: Patient is a 52 y.o. female with with history of CAD-requiring PCI in 2012, hypertension, longstanding history of tobacco use-medication noncompliance for the past 6 months (especially to antihypertensives) presented to the hospital for evaluation of chest pain.  Upon further evaluation she was found to have accelerated hypertension and elevated troponins.  She was subsequently admitted to the hospitalist service.  See below for further details.  Subjective: Awake alert-no chest pain or shortness of breath.  Claims her last drink was approximately 3-4 days back.  She is minimally tremulous but completely awake and alert.  Assessment/Plan: Hypertension: Better controlled-has been started on Coreg and losartan-continue to follow and optimize.  Elevated troponin: Suspect demand ischemia in the setting of uncontrolled hypertension.  Evaluated by cardiology last night, remains on aspirin, statin, beta-blocker and IV heparin.  Awaiting nuclear stress test results and further recommendations from cardiology.  CAD-s/p PCI in 2012: See above  Dyslipidemia: Continue statin  Bipolar disorder: Stable-somewhat anxious given elevated troponin issues and hypertension but denies any suicidal or homicidal ideation.  Continue Depakote and Lexapro.  Tobacco abuse: Counseled  Alcohol abuse: Claims she drinks 1.5 bottles of white wine on a daily basis-her last drink was approximately 3-4 days back.  She is completely awake and alert-minimal tremors-continue Ativan per protocol.  Noncompliance to medications: Counseled  DVT Prophylaxis: Full dose anticoagulation with Heparin  Code Status: Full code   Family Communication: None at bedside  Disposition Plan: Remain  inpatient-Home once work-up is complete  Antimicrobial agents: Anti-infectives (From admission, onward)   None      Procedures: None  CONSULTS: Cardiology  Time spent: 25-minutes-Greater than 50% of this time was spent in counseling, explanation of diagnosis, planning of further management, and coordination of care.  MEDICATIONS: Scheduled Meds: . aspirin EC  81 mg Oral Daily  . atorvastatin  80 mg Oral Daily  . carvedilol  6.25 mg Oral BID WC  . divalproex  1,000 mg Oral QHS  . escitalopram  20 mg Oral Daily  . LORazepam  0-4 mg Intravenous Q6H   Or  . LORazepam  0-4 mg Oral Q6H  . [START ON 02/02/2018] LORazepam  0-4 mg Intravenous Q12H   Or  . [START ON 02/02/2018] LORazepam  0-4 mg Oral Q12H  . losartan  25 mg Oral Daily  . nicotine  21 mg Transdermal Daily  . pantoprazole  40 mg Oral BH-q7a  . [START ON 02/01/2018] pneumococcal 23 valent vaccine  0.5 mL Intramuscular Tomorrow-1000  . thiamine  100 mg Oral Daily   Or  . thiamine  100 mg Intravenous Daily  . triamterene-hydrochlorothiazide  1 tablet Oral Daily   Continuous Infusions: . heparin    . nitroGLYCERIN Stopped (01/30/18 2234)   PRN Meds:.acetaminophen, albuterol, alum & mag hydroxide-simeth, hydrALAZINE, nitroGLYCERIN, ondansetron (ZOFRAN) IV, zolpidem   PHYSICAL EXAM: Vital signs: Vitals:   01/31/18 1340 01/31/18 1341 01/31/18 1343 01/31/18 1344  BP:  (!) 148/68 (!) 151/65 (!) 147/71  Pulse: 84 91 92 89  Resp:      Temp:      TempSrc:      SpO2:  Weight:       Filed Weights   01/30/18 1935 01/30/18 2354 01/31/18 0438  Weight: 79.4 kg 77.9 kg 77.7 kg   Body mass index is 32.39 kg/m.   General appearance :Awake, alert, not in any distress.  Eyes:Pink conjunctiva HEENT: Atraumatic and Normocephalic Neck: supple Resp:Good air entry bilaterally, no added sounds  CVS: S1 S2 regular, no murmurs.  GI: Bowel sounds present, Non tender and not distended with no gaurding, rigidity or  rebound.No organomegaly Extremities: B/L Lower Ext shows no edema, both legs are warm to touch Neurology:  speech clear,Non focal, sensation is grossly intact. Musculoskeletal:No digital cyanosis Skin:No Rash, warm and dry Wounds:N/A  I have personally reviewed following labs and imaging studies  LABORATORY DATA: CBC: Recent Labs  Lab 01/30/18 1804 01/31/18 0314  WBC 5.6 4.6  HGB 15.7* 14.5  HCT 50.2* 46.6*  MCV 91.4 90.5  PLT 296 273    Basic Metabolic Panel: Recent Labs  Lab 01/30/18 1804 01/30/18 2318  NA 143  --   K 4.0  --   CL 109  --   CO2 23  --   GLUCOSE 117*  --   BUN 11  --   CREATININE 0.94 0.80  CALCIUM 9.4  --     GFR: CrCl cannot be calculated (Unknown ideal weight.).  Liver Function Tests: No results for input(s): AST, ALT, ALKPHOS, BILITOT, PROT, ALBUMIN in the last 168 hours. No results for input(s): LIPASE, AMYLASE in the last 168 hours. No results for input(s): AMMONIA in the last 168 hours.  Coagulation Profile: No results for input(s): INR, PROTIME in the last 168 hours.  Cardiac Enzymes: Recent Labs  Lab 01/30/18 2318 01/31/18 0314 01/31/18 0939  TROPONINI 0.23* 0.29* 0.31*    BNP (last 3 results) No results for input(s): PROBNP in the last 8760 hours.  HbA1C: Recent Labs    01/31/18 0314  HGBA1C 5.4    CBG: No results for input(s): GLUCAP in the last 168 hours.  Lipid Profile: Recent Labs    01/31/18 0314  CHOL 135  HDL 41  LDLCALC 43  TRIG 256*  CHOLHDL 3.3    Thyroid Function Tests: No results for input(s): TSH, T4TOTAL, FREET4, T3FREE, THYROIDAB in the last 72 hours.  Anemia Panel: No results for input(s): VITAMINB12, FOLATE, FERRITIN, TIBC, IRON, RETICCTPCT in the last 72 hours.  Urine analysis:    Component Value Date/Time   COLORURINE STRAW (A) 07/02/2017 0110   APPEARANCEUR CLEAR 07/02/2017 0110   LABSPEC 1.006 07/02/2017 0110   PHURINE 8.0 07/02/2017 0110   GLUCOSEU NEGATIVE 07/02/2017 0110     HGBUR NEGATIVE 07/02/2017 0110   BILIRUBINUR NEGATIVE 07/02/2017 0110   KETONESUR NEGATIVE 07/02/2017 0110   PROTEINUR NEGATIVE 07/02/2017 0110   UROBILINOGEN 0.2 10/21/2014 1549   NITRITE NEGATIVE 07/02/2017 0110   LEUKOCYTESUR NEGATIVE 07/02/2017 0110    Sepsis Labs: Lactic Acid, Venous    Component Value Date/Time   LATICACIDVEN 0.7 08/27/2014 0515    MICROBIOLOGY: No results found for this or any previous visit (from the past 240 hour(s)).  RADIOLOGY STUDIES/RESULTS: Dg Chest 2 View  Result Date: 01/30/2018 CLINICAL DATA:  Chest pain EXAM: CHEST - 2 VIEW COMPARISON:  07/02/2017 FINDINGS: Lungs are clear.  No pleural effusion or pneumothorax. The heart is normal in size. Visualized osseous structures are within normal limits. IMPRESSION: Normal chest radiographs. Electronically Signed   By: Charline Bills M.D.   On: 01/30/2018 18:32     LOS: 0 days  Jeoffrey MassedShanker Caria Transue, MD  [facility]  If 7PM-7AM, please contact night-coverage  Please page via [email or web address] [email or web address]-click on MD name and type text message  01/31/2018, 1:51 PM

## 2018-01-31 NOTE — Progress Notes (Addendum)
Progress Note  Patient Name: [name] Date of Encounter: 01/31/2018  Primary Cardiologist:Dr. Katrinka BlazingSmith EP: Dr. Ladona Ridgelaylor  Subjective   BP is improving. Chest pain resolved with improved BP.   Inpatient Medications    Scheduled Meds: . aspirin EC  81 mg Oral Daily  . atorvastatin  80 mg Oral Daily  . divalproex  1,000 mg Oral QHS  . escitalopram  20 mg Oral Daily  . LORazepam  0-4 mg Intravenous Q6H   Or  . LORazepam  0-4 mg Oral Q6H  . [START ON 02/02/2018] LORazepam  0-4 mg Intravenous Q12H   Or  . [START ON 02/02/2018] LORazepam  0-4 mg Oral Q12H  . metoprolol tartrate  50 mg Oral BID  . nicotine  21 mg Transdermal Daily  . pantoprazole  40 mg Oral BH-q7a  . [START ON 02/01/2018] pneumococcal 23 valent vaccine  0.5 mL Intramuscular Tomorrow-1000  . thiamine  100 mg Oral Daily   Or  . thiamine  100 mg Intravenous Daily  . triamterene-hydrochlorothiazide  1 tablet Oral Daily   Continuous Infusions: . heparin 950 Units/hr (01/31/18 0539)  . nitroGLYCERIN Stopped (01/30/18 2234)   PRN Meds: acetaminophen, albuterol, alum & mag hydroxide-simeth, hydrALAZINE, nitroGLYCERIN, ondansetron (ZOFRAN) IV, zolpidem   Vital Signs    Vitals:   01/30/18 2358 01/31/18 0438 01/31/18 0543 01/31/18 0815  BP: (!) 142/66 (!) 185/85 (!) 164/78 (!) 163/90  Pulse: 64 60  (!) 59  Resp:  18    Temp: 97.8 F (36.6 C) 98.4 F (36.9 C)    TempSrc: Oral Oral    SpO2: 97% 97%    Weight:  77.7 kg      Intake/Output Summary (Last 24 hours) at 01/31/2018 0942 Last data filed at 01/31/2018 0646 Gross per 24 hour  Intake 1085.35 ml  Output 1300 ml  Net -214.65 ml   Filed Weights   01/30/18 1935 01/30/18 2354 01/31/18 0438  Weight: 79.4 kg 77.9 kg 77.7 kg    Telemetry    SR at 60s - Personally Reviewed  ECG    N/A  Physical Exam   GEN: No acute distress.   Neck: No JVD Cardiac: RRR, no murmurs, rubs, or gallops.  Respiratory: Clear to auscultation bilaterally. GI: Soft,  nontender, non-distended  MS: No edema; No deformity. Neuro:  Nonfocal  Psych: Normal affect   Labs    Chemistry Recent Labs  Lab 01/30/18 1804 01/30/18 2318  NA 143  --   K 4.0  --   CL 109  --   CO2 23  --   GLUCOSE 117*  --   BUN 11  --   CREATININE 0.94 0.80  CALCIUM 9.4  --   GFRNONAA >60 >60  GFRAA >60 >60  ANIONGAP 11  --      Hematology Recent Labs  Lab 01/30/18 1804 01/31/18 0314  WBC 5.6 4.6  RBC 5.49* 5.15*  HGB 15.7* 14.5  HCT 50.2* 46.6*  MCV 91.4 90.5  MCH 28.6 28.2  MCHC 31.3 31.1  RDW 14.0 14.0  PLT 296 273    Cardiac Enzymes Recent Labs  Lab 01/30/18 2318 01/31/18 0314  TROPONINI 0.23* 0.29*    Recent Labs  Lab 01/30/18 1827  TROPIPOC 0.16*     Radiology    Dg Chest 2 View  Result Date: 01/30/2018 CLINICAL DATA:  Chest pain EXAM: CHEST - 2 VIEW COMPARISON:  07/02/2017 FINDINGS: Lungs are clear.  No pleural effusion or pneumothorax. The heart is normal  in size. Visualized osseous structures are within normal limits. IMPRESSION: Normal chest radiographs. Electronically Signed   By: Charline BillsSriyesh  Krishnan M.D.   On: 01/30/2018 18:32   Cardiac Studies   Pending reading of echo  Patient Profile     Janet [name] is a 52 y.o. female with a hx of NSTEMI 2012 w/ DES CFX & RCA, non-obs CAD at cath 2015, HTN, GERD, tobacco and ETOHabuse, bipolar disorder and prior suicide attempt seen for chest pain and SOB in setting of hypertensive urgency and medication non compliance. Quit alcohol drinking 2 days ago.   Assessment & Plan    1. Chest pain and elevated troponin (demand ischemia) in setting of Accelerated Hypertension/Hypertensive Urgency due to medication non compliance - Troponin 0.16>>0.23>>0.29. UDS clear. On IV heparin. EKG with non specific TWI in inferior lateral leads which similar to prior EKG. Echo done, pending reading. Continue ASA 81mg  , Lipitor 80mg  and BB as below. Will do stress test today. Pending echo. Her symptoms is  different then her symptoms when had PCI in 2012.  2. Hypertensive urgency - BP improving. Off nitro drip. Will change metoprolol to coreg. ADD ARB. Continue Maxzide.       SignedManson Passey, Bhavinkumar Bhagat, PA  01/31/2018, 9:42 AM    I have seen, examined and evaluated the patient this AM along with Mr.Bhagat, PA-C.  After reviewing all the available data and chart, we discussed the patients laboratory, study & physical findings as well as symptoms in detail. I agree with his findings, examination as well as impression recommendations as per our discussion.    [name] has a history of coronary artery disease, but also has medical non-adherence and presents with what amounts to be hypertensive emergency with elevated troponin levels.  The trend of the troponin elevation is more consistent with hypertension and global wall stress as opposed to ACS, however with her history of CAD we do need to exclude ischemic CAD. -For now, I suspect this is Demand Ischemia  Plan for now will be to be more aggressive on blood pressure control by switching from metoprolol to Coreg, adding ARB and weaning off nitroglycerin drip.  Rest of this can be done as an outpatient if stabilizes. For ischemic evaluation we will order a Myoview stress test and follow-up on her echocardiogram.  Pending results of her evaluation, can consider further titration of antihypertensive medications as an inpatient versus discharge home and close follow-up.  Continue statin for hyperlipidemia and aspirin for existing CAD.    Bryan Lemmaavid , M.D., M.S. Interventional Cardiologist   Pager # [phone] Phone # [phone] [address] [place], [place] [address]     For questions or updates, please contact [facility] Please consult [email or web address] for contact info under

## 2018-01-31 NOTE — Progress Notes (Signed)
   [name] presented for a nuclear stress test today.  No immediate complications.  Stress imaging is pending at this time.  Preliminary EKG findings may be listed in the chart, but the stress test result will not be finalized until perfusion imaging is complete.   Beatriz StallionKrista M. Ariya Bohannon, PA-C 01/31/2018, 1:47 PM

## 2018-01-31 NOTE — Progress Notes (Signed)
  Echocardiogram 2D Echocardiogram has been performed.  [name], Shanea Karney R 01/31/2018, 9:36 AM

## 2018-01-31 NOTE — Plan of Care (Signed)
  Problem: Clinical Measurements: Goal: Will remain free from infection Outcome: Progressing Note:  No s/s of infection noted. Goal: Respiratory complications will improve Outcome: Progressing Note:  No s/s of respiratory complications noted. Goal: Cardiovascular complication will be avoided Outcome: Progressing Note:  BP improved with IV hydralazine.

## 2018-01-31 NOTE — Care Management Note (Signed)
Case Management Note  Patient Details  Name: Janet [name] MRN: [id number] Date of Birth: 12-28-1965  Subjective/Objective: Pt presented for Hypertension urgency. Per patient she is currently working, however no insurance at this time. Patient is trying to get insurance outside of her job. [facility] to [facility]- per pt she will make her follow up appointment. [facility]                    Action/Plan: No further needs from CM at this time.   Expected Discharge Date:                  Expected Discharge Plan:  Home/Self Care  In-House Referral:  NA  Discharge planning Services  CM Consult  Post Acute Care Choice:  NA Choice offered to:  NA  DME Arranged:  N/A DME Agency:  NA  HH Arranged:  NA HH Agency:  NA  Status of Service:  Completed, signed off  If discussed at Long Length of Stay Meetings, dates discussed:    Additional Comments:  Gala LewandowskyGraves-Bigelow, Orli Degrave Kaye, RN 01/31/2018, 10:12 AM

## 2018-02-01 DIAGNOSIS — R072 Precordial pain: Secondary | ICD-10-CM

## 2018-02-01 DIAGNOSIS — I214 Non-ST elevation (NSTEMI) myocardial infarction: Secondary | ICD-10-CM

## 2018-02-01 LAB — CBC
HCT: 48.6 % — ABNORMAL HIGH (ref 36.0–46.0)
Hemoglobin: 15.5 g/dL — ABNORMAL HIGH (ref 12.0–15.0)
MCH: 28 pg (ref 26.0–34.0)
MCHC: 31.9 g/dL (ref 30.0–36.0)
MCV: 87.9 fL (ref 80.0–100.0)
Platelets: 288 10*3/uL (ref 150–400)
RBC: 5.53 MIL/uL — ABNORMAL HIGH (ref 3.87–5.11)
RDW: 13.7 % (ref 11.5–15.5)
WBC: 6.2 10*3/uL (ref 4.0–10.5)
nRBC: 0 % (ref 0.0–0.2)

## 2018-02-01 LAB — HEPARIN LEVEL (UNFRACTIONATED): Heparin Unfractionated: 0.39 IU/mL (ref 0.30–0.70)

## 2018-02-01 MED ORDER — LOSARTAN POTASSIUM 50 MG PO TABS
100.0000 mg | ORAL_TABLET | Freq: Every day | ORAL | Status: DC
  Administered 2018-02-02: 100 mg via ORAL
  Filled 2018-02-01: qty 2

## 2018-02-01 MED ORDER — SODIUM CHLORIDE 0.9% FLUSH
3.0000 mL | Freq: Two times a day (BID) | INTRAVENOUS | Status: DC
  Administered 2018-02-01 – 2018-02-02 (×2): 3 mL via INTRAVENOUS

## 2018-02-01 MED ORDER — LOSARTAN POTASSIUM 50 MG PO TABS
50.0000 mg | ORAL_TABLET | Freq: Every day | ORAL | Status: DC
  Administered 2018-02-01: 50 mg via ORAL
  Filled 2018-02-01: qty 1

## 2018-02-01 MED ORDER — SODIUM CHLORIDE 0.9 % IV SOLN: 250.0000 mL | INTRAVENOUS | Status: DC | PRN

## 2018-02-01 MED ORDER — SODIUM CHLORIDE 0.9 % WEIGHT BASED INFUSION
3.0000 mL/kg/h | INTRAVENOUS | Status: DC
  Administered 2018-02-02: 3 mL/kg/h via INTRAVENOUS

## 2018-02-01 MED ORDER — SODIUM CHLORIDE 0.9% FLUSH: 3.0000 mL | INTRAVENOUS | Status: DC | PRN

## 2018-02-01 MED ORDER — ASPIRIN 81 MG PO CHEW
81.0000 mg | CHEWABLE_TABLET | ORAL | Status: AC
  Administered 2018-02-02: 81 mg via ORAL
  Filled 2018-02-01: qty 1

## 2018-02-01 MED ORDER — CARVEDILOL 12.5 MG PO TABS
12.5000 mg | ORAL_TABLET | Freq: Two times a day (BID) | ORAL | Status: DC
  Filled 2018-02-01: qty 1

## 2018-02-01 MED ORDER — SODIUM CHLORIDE 0.9 % WEIGHT BASED INFUSION: 1.0000 mL/kg/h | INTRAVENOUS | Status: DC

## 2018-02-01 MED ORDER — CARVEDILOL 25 MG PO TABS
25.0000 mg | ORAL_TABLET | Freq: Two times a day (BID) | ORAL | Status: DC
  Administered 2018-02-01 – 2018-02-02 (×3): 25 mg via ORAL
  Filled 2018-02-01 (×3): qty 1

## 2018-02-01 NOTE — Progress Notes (Signed)
PROGRESS NOTE    [name]  ZOX:[id number] DOB: 10-Jan-1966 DOA: 01/30/2018 PCP: Patient, No Pcp Per   Brief Narrative:  Patient is a 52 y.o. female with with history of CAD-requiring PCI in 2012, hypertension, longstanding history of tobacco use-medication noncompliance for the past 6 months (especially to antihypertensives) presented to the hospital for evaluation of chest pain.  Upon further evaluation she was found to have accelerated hypertension and elevated troponins.  She was subsequently admitted to the hospitalist service.  Patient seen in consultation by cardiology who recommended cardiac cath which is scheduled for 12/26.   Assessment & Plan:   Principal Problem:   Hypertensive urgency Active Problems:   CAD (coronary artery disease)   Hypertension   Bipolar disorder, now depressed (HCC)   GERD (gastroesophageal reflux disease)   Tobacco abuse   Chest pain   Chronic diastolic CHF (congestive heart failure) (HCC)   Alcohol abuse   HLD (hyperlipidemia)   Elevated troponin   NSTEMI (non-ST elevated myocardial infarction) (HCC)   Hypertension: Better controlled.  has been started on Coreg and losartan-I will increase Coreg to 25 p.o. twice daily and losartan to 100 mg daily.  Pressure still very elevated  Elevated troponin: Suspect demand ischemia in the setting of uncontrolled hypertension.  Evaluated by cardiology , remains on aspirin, statin, beta-blocker and IV heparin.  Tress test showed areas of ischemia cardiac cath planned for a.m.  CAD-s/p PCI in 2012: See above  Dyslipidemia: Continue statin  Bipolar disorder: Stable-somewhat anxious given elevated troponin issues and hypertension but denies any suicidal or homicidal ideation.  Continue Depakote and Lexapro.  Tobacco abuse: Counseled  Alcohol abuse: Claims she drinks 1.5 bottles of white wine on a daily basis-her last drink was approximately 3-4 days back.  She is completely awake and alert-minimal  tremors-continue Ativan per protocol.  Noncompliance to medications: Counseled  DVT Prophylaxis: Full dose anticoagulation with Heparin  Code Status: Full code   Family Communication: None at bedside  Disposition Plan: Likely home after cardiac cath  Antimicrobial agents:    Anti-infectives (From admission, onward)   None       Subjective: Denies any chest pains.  Blood pressure is better.  Patient is concerned about her alcohol drinking I did reassure her that she is being monitored be via the CIWA protocol  Objective: Vitals:   01/31/18 1932 02/01/18 0009 02/01/18 0402 02/01/18 0737  BP: (!) 165/89 (!) 193/94 (!) 157/90 (!) 164/100  Pulse: 74 69 73 84  Resp: 18 18 18    Temp: 97.7 F (36.5 C)  98.4 F (36.9 C) 98.3 F (36.8 C)  TempSrc: Axillary  Oral Oral  SpO2:   97% 100%  Weight:   75.8 kg     Intake/Output Summary (Last 24 hours) at 02/01/2018 1222 Last data filed at 02/01/2018 0926 Gross per 24 hour  Intake 1897.31 ml  Output -  Net 1897.31 ml   Filed Weights   01/30/18 2354 01/31/18 0438 02/01/18 0402  Weight: 77.9 kg 77.7 kg 75.8 kg    Examination:  General exam: Appears calm and comfortable  Respiratory system: Clear to auscultation. Respiratory effort normal. Cardiovascular system: S1 & S2 heard, RRR. No JVD, murmurs, rubs, gallops or clicks. No pedal edema. Gastrointestinal system: Abdomen is nondistended, soft and nontender. No organomegaly or masses felt. Normal bowel sounds heard. Central nervous system: Alert and oriented. No focal neurological deficits. Extremities: Symmetric 5 x 5 power. Skin: No rashes, lesions or ulcers Psychiatry: Judgement and insight  appear normal. Mood & affect appropriate.     Data Reviewed: I have personally reviewed following labs and imaging studies  CBC: Recent Labs  Lab 01/30/18 1804 01/31/18 0314 02/01/18 0225  WBC 5.6 4.6 6.2  HGB 15.7* 14.5 15.5*  HCT 50.2* 46.6* 48.6*  MCV 91.4 90.5  87.9  PLT 296 273 288   Basic Metabolic Panel: Recent Labs  Lab 01/30/18 1804 01/30/18 2318  NA 143  --   K 4.0  --   CL 109  --   CO2 23  --   GLUCOSE 117*  --   BUN 11  --   CREATININE 0.94 0.80  CALCIUM 9.4  --    GFR: CrCl cannot be calculated (Unknown ideal weight.). Liver Function Tests: No results for input(s): AST, ALT, ALKPHOS, BILITOT, PROT, ALBUMIN in the last 168 hours. No results for input(s): LIPASE, AMYLASE in the last 168 hours. No results for input(s): AMMONIA in the last 168 hours. Coagulation Profile: No results for input(s): INR, PROTIME in the last 168 hours. Cardiac Enzymes: Recent Labs  Lab 01/30/18 2318 01/31/18 0314 01/31/18 0939  TROPONINI 0.23* 0.29* 0.31*   BNP (last 3 results) No results for input(s): PROBNP in the last 8760 hours. HbA1C: Recent Labs    01/31/18 0314  HGBA1C 5.4   CBG: No results for input(s): GLUCAP in the last 168 hours. Lipid Profile: Recent Labs    01/31/18 0314  CHOL 135  HDL 41  LDLCALC 43  TRIG 256*  CHOLHDL 3.3   Thyroid Function Tests: No results for input(s): TSH, T4TOTAL, FREET4, T3FREE, THYROIDAB in the last 72 hours. Anemia Panel: No results for input(s): VITAMINB12, FOLATE, FERRITIN, TIBC, IRON, RETICCTPCT in the last 72 hours. Sepsis Labs: No results for input(s): PROCALCITON, LATICACIDVEN in the last 168 hours.  No results found for this or any previous visit (from the past 240 hour(s)).       Radiology Studies: Dg Chest 2 View  Result Date: 01/30/2018 CLINICAL DATA:  Chest pain EXAM: CHEST - 2 VIEW COMPARISON:  07/02/2017 FINDINGS: Lungs are clear.  No pleural effusion or pneumothorax. The heart is normal in size. Visualized osseous structures are within normal limits. IMPRESSION: Normal chest radiographs. Electronically Signed   By: Charline BillsSriyesh  Krishnan M.D.   On: 01/30/2018 18:32   Nm Myocar Multi W/spect W/wall Motion / Ef  Result Date: 01/31/2018 CLINICAL DATA:  52 year old  female history of coronary disease with prior percutaneous angioplasty and stenting to the circumflex and RCA. EXAM: MYOCARDIAL IMAGING WITH SPECT (REST AND PHARMACOLOGIC-STRESS) GATED LEFT VENTRICULAR WALL MOTION STUDY LEFT VENTRICULAR EJECTION FRACTION TECHNIQUE: Standard myocardial SPECT imaging was performed after resting intravenous injection of 10 mCi Tc-3176m tetrofosmin. Subsequently, intravenous infusion of Lexiscan was performed under the supervision of the Cardiology staff. At peak effect of the drug, 30 mCi Tc-6376m tetrofosmin was injected intravenously and standard myocardial SPECT imaging was performed. Quantitative gated imaging was also performed to evaluate left ventricular wall motion, and estimate left ventricular ejection fraction. COMPARISON:  None. FINDINGS: EKG interpretation: No ST changes during pharmacological stress. Perfusion: There is decreased radiotracer activity within the basilar segment of the lateral wall which improves from stress to rest. This is a moderate size defect of moderate to high severity. No additional decrease counts in the LEFT ventricle. Wall Motion: Normal left ventricular wall motion. No left ventricular dilation. Left Ventricular Ejection Fraction: 43 % End diastolic volume 100 ml End systolic volume 57 ml IMPRESSION: 1. Concern for reversible ischemia  in the basilar segment of the LEFT lateral wall. Moderate size defect of moderate to high severity. 2. Normal left ventricular wall motion. 3. Left ventricular ejection fraction 43% 4. Non invasive risk stratification*: Intermediate *2012 Appropriate Use Criteria for Coronary Revascularization Focused Update: J Am Coll Cardiol. 2012;59(9):857-881. http://content.dementiazones.comonlinejacc.org/article.aspx?articleid=1201161 These results will be called to the ordering clinician or representative by the Radiologist Assistant, and communication documented in the PACS or zVision Dashboard. Electronically Signed   By: Genevive BiStewart  Edmunds M.D.    On: 01/31/2018 15:43        Scheduled Meds: . [START ON 02/02/2018] aspirin  81 mg Oral Pre-Cath  . aspirin EC  81 mg Oral Daily  . atorvastatin  80 mg Oral Daily  . carvedilol  12.5 mg Oral BID WC  . divalproex  1,000 mg Oral QHS  . escitalopram  20 mg Oral Daily  . LORazepam  0-4 mg Intravenous Q6H   Or  . LORazepam  0-4 mg Oral Q6H  . [START ON 02/02/2018] LORazepam  0-4 mg Intravenous Q12H   Or  . [START ON 02/02/2018] LORazepam  0-4 mg Oral Q12H  . losartan  50 mg Oral Daily  . nicotine  21 mg Transdermal Daily  . pantoprazole  40 mg Oral BH-q7a  . pneumococcal 23 valent vaccine  0.5 mL Intramuscular Tomorrow-1000  . sodium chloride flush  3 mL Intravenous Q12H  . thiamine  100 mg Oral Daily  . triamterene-hydrochlorothiazide  1 tablet Oral Daily   Continuous Infusions: . sodium chloride    . [START ON 02/02/2018] sodium chloride     Followed by  . [START ON 02/02/2018] sodium chloride    . heparin 850 Units/hr (02/01/18 0700)  . nitroGLYCERIN Stopped (01/30/18 2234)     LOS: 0 days    Time spent: 25 minutes    Lahoma Crockerheresa C Kashara Blocher, MD FACP [facility] Pager [phone]  If 7PM-7AM, please contact night-coverage [email or web address] Password [email or web address] 02/01/2018, 12:22 PM

## 2018-02-01 NOTE — Progress Notes (Signed)
Progress Note  Patient Name: [name] Date of Encounter: 02/01/2018  Primary Cardiologist:Dr. Katrinka Blazing EP: Dr. Ladona Ridgel  Subjective   No chest pain this am. Mild dyspnea. No events.   Inpatient Medications    Scheduled Meds: . aspirin EC  81 mg Oral Daily  . atorvastatin  80 mg Oral Daily  . carvedilol  6.25 mg Oral BID WC  . divalproex  1,000 mg Oral QHS  . escitalopram  20 mg Oral Daily  . LORazepam  0-4 mg Intravenous Q6H   Or  . LORazepam  0-4 mg Oral Q6H  . [START ON 02/02/2018] LORazepam  0-4 mg Intravenous Q12H   Or  . [START ON 02/02/2018] LORazepam  0-4 mg Oral Q12H  . losartan  25 mg Oral Daily  . nicotine  21 mg Transdermal Daily  . pantoprazole  40 mg Oral BH-q7a  . pneumococcal 23 valent vaccine  0.5 mL Intramuscular Tomorrow-1000  . thiamine  100 mg Oral Daily   Or  . thiamine  100 mg Intravenous Daily  . triamterene-hydrochlorothiazide  1 tablet Oral Daily   Continuous Infusions: . heparin 850 Units/hr (01/31/18 2156)  . nitroGLYCERIN Stopped (01/30/18 2234)   PRN Meds: acetaminophen, albuterol, alum & mag hydroxide-simeth, hydrALAZINE, nitroGLYCERIN, ondansetron (ZOFRAN) IV, zolpidem   Vital Signs    Vitals:   01/31/18 1655 01/31/18 1932 02/01/18 0009 02/01/18 0402  BP:  (!) 165/89 (!) 193/94 (!) 157/90  Pulse: 70 74 69 73  Resp:  18 18 18   Temp:  97.7 F (36.5 C)  98.4 F (36.9 C)  TempSrc:  Axillary  Oral  SpO2:    97%  Weight:    75.8 kg    Intake/Output Summary (Last 24 hours) at 02/01/2018 0730 Last data filed at 02/01/2018 0600 Gross per 24 hour  Intake 1537.31 ml  Output -  Net 1537.31 ml   Filed Weights   01/30/18 2354 01/31/18 0438 02/01/18 0402  Weight: 77.9 kg 77.7 kg 75.8 kg    Telemetry    sinus- Personally Reviewed  ECG    N/A  Physical Exam   General: Well developed, well nourished, NAD  HEENT: OP clear, mucus membranes moist  SKIN: warm, dry. No rashes. Neuro: No focal deficits  Musculoskeletal:  Muscle strength 5/5 all ext  Psychiatric: Mood and affect normal  Neck: No JVD, no carotid bruits, no thyromegaly, no lymphadenopathy.  Lungs:Clear bilaterally, no wheezes, rhonci, crackles Cardiovascular: Regular rate and rhythm. No murmurs, gallops or rubs. Abdomen:Soft. Bowel sounds present. Non-tender.  Extremities: No lower extremity edema. Pulses are 2 + in the bilateral DP/PT.   Labs    Chemistry Recent Labs  Lab 01/30/18 1804 01/30/18 2318  NA 143  --   K 4.0  --   CL 109  --   CO2 23  --   GLUCOSE 117*  --   BUN 11  --   CREATININE 0.94 0.80  CALCIUM 9.4  --   GFRNONAA >60 >60  GFRAA >60 >60  ANIONGAP 11  --      Hematology Recent Labs  Lab 01/30/18 1804 01/31/18 0314 02/01/18 0225  WBC 5.6 4.6 6.2  RBC 5.49* 5.15* 5.53*  HGB 15.7* 14.5 15.5*  HCT 50.2* 46.6* 48.6*  MCV 91.4 90.5 87.9  MCH 28.6 28.2 28.0  MCHC 31.3 31.1 31.9  RDW 14.0 14.0 13.7  PLT 296 273 288    Cardiac Enzymes Recent Labs  Lab 01/30/18 2318 01/31/18 0314 01/31/18 0939  TROPONINI 0.23*  0.29* 0.31*    Recent Labs  Lab 01/30/18 1827  TROPIPOC 0.16*     Radiology    Dg Chest 2 View  Result Date: 01/30/2018 CLINICAL DATA:  Chest pain EXAM: CHEST - 2 VIEW COMPARISON:  07/02/2017 FINDINGS: Lungs are clear.  No pleural effusion or pneumothorax. The heart is normal in size. Visualized osseous structures are within normal limits. IMPRESSION: Normal chest radiographs. Electronically Signed   By: Charline BillsSriyesh  Krishnan M.D.   On: 01/30/2018 18:32   Nm Myocar Multi W/spect W/wall Motion / Ef  Result Date: 01/31/2018 CLINICAL DATA:  52 year old female history of coronary disease with prior percutaneous angioplasty and stenting to the circumflex and RCA. EXAM: MYOCARDIAL IMAGING WITH SPECT (REST AND PHARMACOLOGIC-STRESS) GATED LEFT VENTRICULAR WALL MOTION STUDY LEFT VENTRICULAR EJECTION FRACTION TECHNIQUE: Standard myocardial SPECT imaging was performed after resting intravenous injection  of 10 mCi Tc-1640m tetrofosmin. Subsequently, intravenous infusion of Lexiscan was performed under the supervision of the Cardiology staff. At peak effect of the drug, 30 mCi Tc-6040m tetrofosmin was injected intravenously and standard myocardial SPECT imaging was performed. Quantitative gated imaging was also performed to evaluate left ventricular wall motion, and estimate left ventricular ejection fraction. COMPARISON:  None. FINDINGS: EKG interpretation: No ST changes during pharmacological stress. Perfusion: There is decreased radiotracer activity within the basilar segment of the lateral wall which improves from stress to rest. This is a moderate size defect of moderate to high severity. No additional decrease counts in the LEFT ventricle. Wall Motion: Normal left ventricular wall motion. No left ventricular dilation. Left Ventricular Ejection Fraction: 43 % End diastolic volume 100 ml End systolic volume 57 ml IMPRESSION: 1. Concern for reversible ischemia in the basilar segment of the LEFT lateral wall. Moderate size defect of moderate to high severity. 2. Normal left ventricular wall motion. 3. Left ventricular ejection fraction 43% 4. Non invasive risk stratification*: Intermediate *2012 Appropriate Use Criteria for Coronary Revascularization Focused Update: J Am Coll Cardiol. 2012;59(9):857-881. http://content.dementiazones.comonlinejacc.org/article.aspx?articleid=1201161 These results will be called to the ordering clinician or representative by the Radiologist Assistant, and communication documented in the PACS or zVision Dashboard. Electronically Signed   By: Genevive BiStewart  Edmunds M.D.   On: 01/31/2018 15:43   Cardiac Studies   Echo 02/01/18: - Left ventricle: The cavity size was normal. There was moderate   focal basal hypertrophy of the septum with otherwise mild   concentric LVH. Systolic function was normal. The estimated   ejection fraction was in the range of 60% to 65%. Wall motion was   normal; there were no  regional wall motion abnormalities. Doppler   parameters are consistent with abnormal left ventricular   relaxation (grade 1 diastolic dysfunction). - Aortic valve: Transvalvular velocity was within the normal range.   There was no stenosis. There was no regurgitation. - Mitral valve: Transvalvular velocity was within the normal range.   There was no evidence for stenosis. There was no regurgitation. - Right ventricle: The cavity size was normal. Wall thickness was   normal. Systolic function was normal. - Atrial septum: No defect or patent foramen ovale was identified   by color flow Doppler. - Tricuspid valve: There was trivial regurgitation. - Pulmonic valve: Transvalvular velocity was within the normal   range. There was no evidence for stenosis. There was no   regurgitation. - Pulmonary arteries: Systolic pressure was within the normal   range. PA peak pressure: 18 mm Hg (S).  Patient Profile     [name] is a 52 y.o.  female with a hx of NSTEMI 2012 w/ DES CFX & RCA, non-obs CAD at cath 2015, HTN, GERD, tobacco and ETOHabuse, bipolar disorder and prior suicide attempt seen for chest pain and SOB in setting of hypertensive urgency and medication non compliance. Nuclear stress test 01/31/18 with possible ischemia.  LV function normal by echo 01/31/18.   Assessment & Plan    1. Chest pain/elevated troponin: Troponin is elevated in the setting of hypertensive urgency. Nuclear stress test with possible ischemia. Echo with normal LV systolic function with no wall motion abnormality. Plans for cardiac cath 02/02/18. Will continue ASA, Lipitor and Coreg. Continue IV heparin. NPO at midnight for cardiac cath tomorrow.   2. Hypertensive urgency: BP is still elevated. Will increase Coreg to 12.5 mg po BID and Losartan to 50 mg po daily. Continue Maxzide.      Signed, Verne Carrowhristopher Rambo Sarafian, MD  02/01/2018, 7:30 AM      For questions or updates, please contact [facility] Please  consult [email or web address] for contact info under

## 2018-02-01 NOTE — Progress Notes (Signed)
ANTICOAGULATION CONSULT NOTE  Pharmacy Consult for heparin Indication: chest pain/ACS  Allergies  Allergen Reactions  . Codeine Nausea And Vomiting  . Percocet [Oxycodone-Acetaminophen] Nausea And Vomiting  . Vicodin [Hydrocodone-Acetaminophen] Nausea And Vomiting  . Latex Itching    Reaction to powder in latex gloves    Patient Measurements: Weight: 171 lb 6.4 oz (77.7 kg) Heparin Dosing Weight: 70kg  Vital Signs: Temp: 97.7 F (36.5 C) (12/24 1932) Temp Source: Axillary (12/24 1932) BP: 193/94 (12/25 0009) Pulse Rate: 69 (12/25 0009)  Labs: Recent Labs    01/30/18 1804 01/30/18 2318  01/31/18 0314 01/31/18 0939 01/31/18 2005 02/01/18 0225  HGB 15.7*  --   --  14.5  --   --  15.5*  HCT 50.2*  --   --  46.6*  --   --  48.6*  PLT 296  --   --  273  --   --  288  HEPARINUNFRC  --   --    < > <0.10* 1.02* 0.25* 0.39  CREATININE 0.94 0.80  --   --   --   --   --   TROPONINI  --  0.23*  --  0.29* 0.31*  --   --    < > = values in this interval not displayed.    CrCl cannot be calculated (Unknown ideal weight.).  Assessment: 52 y.o. female with chest pain and elevated cardiac markers for heparin.  Heparin started in ED ~ 8 pm, but d/c'd shortly after.  Now s/p stress test that shows possible ischemia and plan for cath on Thursday.     Heparin level this morning 0.39 units/ml   Goal of Therapy:  Heparin level 0.3-0.7 units/ml Monitor platelets by anticoagulation protocol: Yes   Plan:  Continue heparin at 850 units/hr Daily HL and CBC while on heparin Monitor for bleeding complications  Thanks for allowing pharmacy to be a part of this patient's care.  Talbert CageLora Yahaira Bruski, PharmD Clinical Pharmacist 02/01/2018 3:13 AM

## 2018-02-01 NOTE — H&P (View-Only) (Signed)
 Progress Note  Patient Name: [name] Date of Encounter: 02/01/2018  Primary Cardiologist:Dr. Smith EP: Dr. Taylor  Subjective   No chest pain this am. Mild dyspnea. No events.   Inpatient Medications    Scheduled Meds: . aspirin EC  81 mg Oral Daily  . atorvastatin  80 mg Oral Daily  . carvedilol  6.25 mg Oral BID WC  . divalproex  1,000 mg Oral QHS  . escitalopram  20 mg Oral Daily  . LORazepam  0-4 mg Intravenous Q6H   Or  . LORazepam  0-4 mg Oral Q6H  . [START ON 02/02/2018] LORazepam  0-4 mg Intravenous Q12H   Or  . [START ON 02/02/2018] LORazepam  0-4 mg Oral Q12H  . losartan  25 mg Oral Daily  . nicotine  21 mg Transdermal Daily  . pantoprazole  40 mg Oral BH-q7a  . pneumococcal 23 valent vaccine  0.5 mL Intramuscular Tomorrow-1000  . thiamine  100 mg Oral Daily   Or  . thiamine  100 mg Intravenous Daily  . triamterene-hydrochlorothiazide  1 tablet Oral Daily   Continuous Infusions: . heparin 850 Units/hr (01/31/18 2156)  . nitroGLYCERIN Stopped (01/30/18 2234)   PRN Meds: acetaminophen, albuterol, alum & mag hydroxide-simeth, hydrALAZINE, nitroGLYCERIN, ondansetron (ZOFRAN) IV, zolpidem   Vital Signs    Vitals:   01/31/18 1655 01/31/18 1932 02/01/18 0009 02/01/18 0402  BP:  (!) 165/89 (!) 193/94 (!) 157/90  Pulse: 70 74 69 73  Resp:  18 18 18  Temp:  97.7 F (36.5 C)  98.4 F (36.9 C)  TempSrc:  Axillary  Oral  SpO2:    97%  Weight:    75.8 kg    Intake/Output Summary (Last 24 hours) at 02/01/2018 0730 Last data filed at 02/01/2018 0600 Gross per 24 hour  Intake 1537.31 ml  Output -  Net 1537.31 ml   Filed Weights   01/30/18 2354 01/31/18 0438 02/01/18 0402  Weight: 77.9 kg 77.7 kg 75.8 kg    Telemetry    sinus- Personally Reviewed  ECG    N/A  Physical Exam   General: Well developed, well nourished, NAD  HEENT: OP clear, mucus membranes moist  SKIN: warm, dry. No rashes. Neuro: No focal deficits  Musculoskeletal:  Muscle strength 5/5 all ext  Psychiatric: Mood and affect normal  Neck: No JVD, no carotid bruits, no thyromegaly, no lymphadenopathy.  Lungs:Clear bilaterally, no wheezes, rhonci, crackles Cardiovascular: Regular rate and rhythm. No murmurs, gallops or rubs. Abdomen:Soft. Bowel sounds present. Non-tender.  Extremities: No lower extremity edema. Pulses are 2 + in the bilateral DP/PT.   Labs    Chemistry Recent Labs  Lab 01/30/18 1804 01/30/18 2318  NA 143  --   K 4.0  --   CL 109  --   CO2 23  --   GLUCOSE 117*  --   BUN 11  --   CREATININE 0.94 0.80  CALCIUM 9.4  --   GFRNONAA >60 >60  GFRAA >60 >60  ANIONGAP 11  --      Hematology Recent Labs  Lab 01/30/18 1804 01/31/18 0314 02/01/18 0225  WBC 5.6 4.6 6.2  RBC 5.49* 5.15* 5.53*  HGB 15.7* 14.5 15.5*  HCT 50.2* 46.6* 48.6*  MCV 91.4 90.5 87.9  MCH 28.6 28.2 28.0  MCHC 31.3 31.1 31.9  RDW 14.0 14.0 13.7  PLT 296 273 288    Cardiac Enzymes Recent Labs  Lab 01/30/18 2318 01/31/18 0314 01/31/18 0939  TROPONINI 0.23*   0.29* 0.31*    Recent Labs  Lab 01/30/18 1827  TROPIPOC 0.16*     Radiology    Dg Chest 2 View  Result Date: 01/30/2018 CLINICAL DATA:  Chest pain EXAM: CHEST - 2 VIEW COMPARISON:  07/02/2017 FINDINGS: Lungs are clear.  No pleural effusion or pneumothorax. The heart is normal in size. Visualized osseous structures are within normal limits. IMPRESSION: Normal chest radiographs. Electronically Signed   By: Sriyesh  Krishnan M.D.   On: 01/30/2018 18:32   Nm Myocar Multi W/spect W/wall Motion / Ef  Result Date: 01/31/2018 CLINICAL DATA:  52-year-old female history of coronary disease with prior percutaneous angioplasty and stenting to the circumflex and RCA. EXAM: MYOCARDIAL IMAGING WITH SPECT (REST AND PHARMACOLOGIC-STRESS) GATED LEFT VENTRICULAR WALL MOTION STUDY LEFT VENTRICULAR EJECTION FRACTION TECHNIQUE: Standard myocardial SPECT imaging was performed after resting intravenous injection  of 10 mCi Tc-99m tetrofosmin. Subsequently, intravenous infusion of Lexiscan was performed under the supervision of the Cardiology staff. At peak effect of the drug, 30 mCi Tc-99m tetrofosmin was injected intravenously and standard myocardial SPECT imaging was performed. Quantitative gated imaging was also performed to evaluate left ventricular wall motion, and estimate left ventricular ejection fraction. COMPARISON:  None. FINDINGS: EKG interpretation: No ST changes during pharmacological stress. Perfusion: There is decreased radiotracer activity within the basilar segment of the lateral wall which improves from stress to rest. This is a moderate size defect of moderate to high severity. No additional decrease counts in the LEFT ventricle. Wall Motion: Normal left ventricular wall motion. No left ventricular dilation. Left Ventricular Ejection Fraction: 43 % End diastolic volume 100 ml End systolic volume 57 ml IMPRESSION: 1. Concern for reversible ischemia in the basilar segment of the LEFT lateral wall. Moderate size defect of moderate to high severity. 2. Normal left ventricular wall motion. 3. Left ventricular ejection fraction 43% 4. Non invasive risk stratification*: Intermediate *2012 Appropriate Use Criteria for Coronary Revascularization Focused Update: J Am Coll Cardiol. 2012;59(9):857-881. http://content.onlinejacc.org/article.aspx?articleid=1201161 These results will be called to the ordering clinician or representative by the Radiologist Assistant, and communication documented in the PACS or zVision Dashboard. Electronically Signed   By: Stewart  Edmunds M.D.   On: 01/31/2018 15:43   Cardiac Studies   Echo 02/01/18: - Left ventricle: The cavity size was normal. There was moderate   focal basal hypertrophy of the septum with otherwise mild   concentric LVH. Systolic function was normal. The estimated   ejection fraction was in the range of 60% to 65%. Wall motion was   normal; there were no  regional wall motion abnormalities. Doppler   parameters are consistent with abnormal left ventricular   relaxation (grade 1 diastolic dysfunction). - Aortic valve: Transvalvular velocity was within the normal range.   There was no stenosis. There was no regurgitation. - Mitral valve: Transvalvular velocity was within the normal range.   There was no evidence for stenosis. There was no regurgitation. - Right ventricle: The cavity size was normal. Wall thickness was   normal. Systolic function was normal. - Atrial septum: No defect or patent foramen ovale was identified   by color flow Doppler. - Tricuspid valve: There was trivial regurgitation. - Pulmonic valve: Transvalvular velocity was within the normal   range. There was no evidence for stenosis. There was no   regurgitation. - Pulmonary arteries: Systolic pressure was within the normal   range. PA peak pressure: 18 mm Hg (S).  Patient Profile     [name] is a 51 y.o.   female with a hx of NSTEMI 2012 w/ DES CFX & RCA, non-obs CAD at cath 2015, HTN, GERD, tobacco and ETOHabuse, bipolar disorder and prior suicide attempt seen for chest pain and SOB in setting of hypertensive urgency and medication non compliance. Nuclear stress test 01/31/18 with possible ischemia.  LV function normal by echo 01/31/18.   Assessment & Plan    1. Chest pain/elevated troponin: Troponin is elevated in the setting of hypertensive urgency. Nuclear stress test with possible ischemia. Echo with normal LV systolic function with no wall motion abnormality. Plans for cardiac cath 02/02/18. Will continue ASA, Lipitor and Coreg. Continue IV heparin. NPO at midnight for cardiac cath tomorrow.   2. Hypertensive urgency: BP is still elevated. Will increase Coreg to 12.5 mg po BID and Losartan to 50 mg po daily. Continue Maxzide.      Signed, Christopher McAlhany, MD  02/01/2018, 7:30 AM      For questions or updates, please contact [facility] Please  consult [email or web address] for contact info under      

## 2018-02-02 ENCOUNTER — Encounter (HOSPITAL_COMMUNITY): Payer: Self-pay | Admitting: Cardiovascular Disease

## 2018-02-02 ENCOUNTER — Encounter (HOSPITAL_COMMUNITY)
Admission: EM | Disposition: A | Discharge status: Home / Self Care | Payer: Self-pay | Source: Home / Self Care | Attending: Internal Medicine

## 2018-02-02 HISTORY — PX: INTRAVASCULAR PRESSURE WIRE/FFR STUDY: CATH118243

## 2018-02-02 HISTORY — PX: LEFT HEART CATH AND CORONARY ANGIOGRAPHY: CATH118249

## 2018-02-02 LAB — BASIC METABOLIC PANEL
Anion gap: 11 (ref 5–15)
BUN: 11 mg/dL (ref 6–20)
CO2: 22 mmol/L (ref 22–32)
Calcium: 9.3 mg/dL (ref 8.9–10.3)
Chloride: 100 mmol/L (ref 98–111)
Creatinine, Ser: 0.76 mg/dL (ref 0.44–1.00)
GFR calc Af Amer: >60 mL/min (ref >60)
GFR calc non Af Amer: >60 mL/min (ref >60)
Glucose, Bld: 110 mg/dL — ABNORMAL HIGH (ref 70–99)
Potassium: 4.5 mmol/L (ref 3.5–5.1)
Sodium: 133 mmol/L — ABNORMAL LOW (ref 135–145)

## 2018-02-02 LAB — HEPARIN LEVEL (UNFRACTIONATED): Heparin Unfractionated: 0.27 IU/mL — ABNORMAL LOW (ref 0.30–0.70)

## 2018-02-02 LAB — CBC
HCT: 50.4 % — ABNORMAL HIGH (ref 36.0–46.0)
Hemoglobin: 15.9 g/dL — ABNORMAL HIGH (ref 12.0–15.0)
MCH: 27.6 pg (ref 26.0–34.0)
MCHC: 31.5 g/dL (ref 30.0–36.0)
MCV: 87.5 fL (ref 80.0–100.0)
Platelets: 283 10*3/uL (ref 150–400)
RBC: 5.76 MIL/uL — ABNORMAL HIGH (ref 3.87–5.11)
RDW: 13.6 % (ref 11.5–15.5)
WBC: 5.6 10*3/uL (ref 4.0–10.5)
nRBC: 0 % (ref 0.0–0.2)

## 2018-02-02 LAB — POCT ACTIVATED CLOTTING TIME: Activated Clotting Time: 373 seconds

## 2018-02-02 SURGERY — LEFT HEART CATH AND CORONARY ANGIOGRAPHY
Anesthesia: LOCAL

## 2018-02-02 MED ORDER — DIVALPROEX SODIUM ER 500 MG PO TB24: 1000.0000 mg | ORAL_TABLET | Freq: Every day | ORAL | 0 refills | Status: DC

## 2018-02-02 MED ORDER — FENTANYL CITRATE (PF) 100 MCG/2ML IJ SOLN
INTRAMUSCULAR | Status: AC
  Filled 2018-02-02: qty 2

## 2018-02-02 MED ORDER — MIDAZOLAM HCL 2 MG/2ML IJ SOLN
INTRAMUSCULAR | Status: DC | PRN
  Administered 2018-02-02 (×2): 1 mg via INTRAVENOUS
  Administered 2018-02-02: 2 mg via INTRAVENOUS

## 2018-02-02 MED ORDER — PANTOPRAZOLE SODIUM 40 MG PO TBEC: 40.0000 mg | DELAYED_RELEASE_TABLET | ORAL | 0 refills | Status: DC

## 2018-02-02 MED ORDER — FENTANYL CITRATE (PF) 100 MCG/2ML IJ SOLN
INTRAMUSCULAR | Status: DC | PRN
  Administered 2018-02-02: 50 ug via INTRAVENOUS
  Administered 2018-02-02 (×2): 25 ug via INTRAVENOUS

## 2018-02-02 MED ORDER — HEPARIN SODIUM (PORCINE) 1000 UNIT/ML IJ SOLN
INTRAMUSCULAR | Status: DC | PRN
  Administered 2018-02-02: 3500 [IU] via INTRAVENOUS
  Administered 2018-02-02: 4000 [IU] via INTRAVENOUS

## 2018-02-02 MED ORDER — HEPARIN (PORCINE) IN NACL 1000-0.9 UT/500ML-% IV SOLN
INTRAVENOUS | Status: AC
  Filled 2018-02-02: qty 1000

## 2018-02-02 MED ORDER — SODIUM CHLORIDE 0.9 % IV SOLN: 250.0000 mL | INTRAVENOUS | Status: DC | PRN

## 2018-02-02 MED ORDER — SODIUM CHLORIDE 0.9% FLUSH: 3.0000 mL | INTRAVENOUS | Status: DC | PRN

## 2018-02-02 MED ORDER — IOHEXOL 350 MG/ML SOLN
INTRAVENOUS | Status: DC | PRN
  Administered 2018-02-02: 55 mL via INTRA_ARTERIAL

## 2018-02-02 MED ORDER — SODIUM CHLORIDE 0.9 % IV SOLN
INTRAVENOUS | Status: AC
  Administered 2018-02-02: 14:00:00 via INTRAVENOUS

## 2018-02-02 MED ORDER — ASPIRIN EC 81 MG PO TBEC: 81.0000 mg | DELAYED_RELEASE_TABLET | Freq: Every day | ORAL | 0 refills | Status: DC

## 2018-02-02 MED ORDER — VERAPAMIL HCL 2.5 MG/ML IV SOLN
INTRAVENOUS | Status: AC
  Filled 2018-02-02: qty 2

## 2018-02-02 MED ORDER — SODIUM CHLORIDE 0.9% FLUSH: 3.0000 mL | Freq: Two times a day (BID) | INTRAVENOUS | Status: DC

## 2018-02-02 MED ORDER — LABETALOL HCL 5 MG/ML IV SOLN: 10.0000 mg | INTRAVENOUS | Status: AC | PRN

## 2018-02-02 MED ORDER — MIDAZOLAM HCL 2 MG/2ML IJ SOLN
INTRAMUSCULAR | Status: AC
  Filled 2018-02-02: qty 2

## 2018-02-02 MED ORDER — TRIAMTERENE-HCTZ 37.5-25 MG PO TABS: 1.0000 | ORAL_TABLET | Freq: Every day | ORAL | 0 refills | Status: DC

## 2018-02-02 MED ORDER — LIDOCAINE HCL (PF) 1 % IJ SOLN
INTRAMUSCULAR | Status: AC
  Filled 2018-02-02: qty 30

## 2018-02-02 MED ORDER — LIDOCAINE HCL (PF) 1 % IJ SOLN
INTRAMUSCULAR | Status: DC | PRN
  Administered 2018-02-02: 2 mL

## 2018-02-02 MED ORDER — ESCITALOPRAM OXALATE 20 MG PO TABS: 20.0000 mg | ORAL_TABLET | Freq: Every day | ORAL | 0 refills | Status: DC

## 2018-02-02 MED ORDER — ATORVASTATIN CALCIUM 40 MG PO TABS: 40.0000 mg | ORAL_TABLET | Freq: Every day | ORAL | 0 refills | Status: DC

## 2018-02-02 MED ORDER — CARVEDILOL 25 MG PO TABS: 25.0000 mg | ORAL_TABLET | Freq: Two times a day (BID) | ORAL | 0 refills | Status: DC

## 2018-02-02 MED ORDER — ALBUTEROL SULFATE HFA 108 (90 BASE) MCG/ACT IN AERS: 2.0000 | INHALATION_SPRAY | Freq: Four times a day (QID) | RESPIRATORY_TRACT | 0 refills | Status: DC | PRN

## 2018-02-02 MED ORDER — HEPARIN (PORCINE) IN NACL 1000-0.9 UT/500ML-% IV SOLN
INTRAVENOUS | Status: DC | PRN
  Administered 2018-02-02 (×2): 500 mL

## 2018-02-02 MED ORDER — VERAPAMIL HCL 2.5 MG/ML IV SOLN
INTRAVENOUS | Status: DC | PRN
  Administered 2018-02-02: 10 mL via INTRA_ARTERIAL

## 2018-02-02 MED ORDER — LOSARTAN POTASSIUM 100 MG PO TABS: 100.0000 mg | ORAL_TABLET | Freq: Every day | ORAL | 0 refills | Status: DC

## 2018-02-02 MED ORDER — HYDRALAZINE HCL 20 MG/ML IJ SOLN: 5.0000 mg | INTRAMUSCULAR | Status: AC | PRN

## 2018-02-02 SURGICAL SUPPLY — 11 items
CATH 5FR JL3.5 JR4 ANG PIG MP (CATHETERS) ×1 IMPLANT
CATH VISTA GUIDE 6FR JR4 (CATHETERS) ×1 IMPLANT
DEVICE RAD COMP TR BAND LRG (VASCULAR PRODUCTS) ×1 IMPLANT
GLIDESHEATH SLEND SS 6F .021 (SHEATH) ×1 IMPLANT
GUIDEWIRE INQWIRE 1.5J.035X260 (WIRE) IMPLANT
GUIDEWIRE PRESSURE COMET II (WIRE) ×1 IMPLANT
INQWIRE 1.5J .035X260CM (WIRE) ×2
KIT ESSENTIALS PG (KITS) ×1 IMPLANT
KIT HEART LEFT (KITS) ×2 IMPLANT
PACK CARDIAC CATHETERIZATION (CUSTOM PROCEDURE TRAY) ×2 IMPLANT
TRANSDUCER W/STOPCOCK (MISCELLANEOUS) ×2 IMPLANT

## 2018-02-02 NOTE — Progress Notes (Signed)
ANTICOAGULATION CONSULT NOTE  Pharmacy Consult for heparin Indication: chest pain/ACS  Allergies  Allergen Reactions  . Codeine Nausea And Vomiting  . Percocet [Oxycodone-Acetaminophen] Nausea And Vomiting  . Vicodin [Hydrocodone-Acetaminophen] Nausea And Vomiting  . Latex Itching    Reaction to powder in latex gloves    Patient Measurements: Height: 5\' 1"  (154.9 cm) Weight: 166 lb 11.2 oz (75.6 kg) IBW/kg (Calculated) : 47.8 Heparin Dosing Weight: 70kg  Vital Signs: Temp: 98.1 F (36.7 C) (12/26 0410) Temp Source: Oral (12/26 0410) BP: 154/93 (12/26 0410) Pulse Rate: 70 (12/26 0410)  Labs: Recent Labs    01/30/18 1804 01/30/18 2318  01/31/18 0314 01/31/18 0939 01/31/18 2005 02/01/18 0225 02/02/18 0320  HGB 15.7*  --   --  14.5  --   --  15.5* 15.9*  HCT 50.2*  --   --  46.6*  --   --  48.6* 50.4*  PLT 296  --   --  273  --   --  288 283  HEPARINUNFRC  --   --    < > <0.10* 1.02* 0.25* 0.39 0.27*  CREATININE 0.94 0.80  --   --   --   --   --  0.76  TROPONINI  --  0.23*  --  0.29* 0.31*  --   --   --    < > = values in this interval not displayed.    Estimated Creatinine Clearance: 77.4 mL/min (by C-G formula based on SCr of 0.76 mg/dL).  Assessment: 52 y.o. female presenting with chest pain and elevated cardiac markers.  Heparin started in ED ~ 8 pm, but d/c'd shortly after. Pharmacy has now been consulted to dose heparin.   Heparin level this morning slightly subtherapeutic at 0.27 after heparin infusion was held approximately 45 minutes overnight. Stress test that shows possible ischemia and plan for cath on today. CBC stable wnl, no documented bleeding. Will continue heparin at current rate and reassess post-cath.   Goal of Therapy:  Heparin level 0.3-0.7 units/ml Monitor platelets by anticoagulation protocol: Yes   Plan:  Continue heparin infusion at 850 units/hr Daily HL and CBC while on heparin Monitor for bleeding complications   Harlow MaresAmy Sebastyan Snodgrass,  PharmD PGY1 Pharmacy Resident Phone [phone]  02/02/2018   8:22 AM

## 2018-02-02 NOTE — Progress Notes (Signed)
[id number] Gave pt heart healthy and low sodium diets. Answered pt's questions re diet. Discussed NTG use, walking for ex, smoking cessation and alcohol abstinence. Pt stated she wants to quit and will probably use the nicotine patches. Also stated she might go to [facility] for support. Did not refer to CRP 2 as pt stated she did not have MI and RN confirmed. Pt stated HTN put strain on heart. She knows importance of taking her BP meds and trying to watch sodium. Set up lunch. Would be helpful to have case manager to see.  Luetta NuttingCharlene Laquitha Heslin RN BSN 02/02/2018 2:40 PM

## 2018-02-02 NOTE — Interval H&P Note (Signed)
History and Physical Interval Note:  02/02/2018 11:25 AM  [name]  has presented today for surgery, with the diagnosis of NSTEMI.  The various methods of treatment have been discussed with the patient and family. After consideration of risks, benefits and other options for treatment, the patient has consented to  Procedure(s): LEFT HEART CATH AND CORONARY ANGIOGRAPHY (N/A) as a surgical intervention .  The patient's history has been reviewed, patient examined, no change in status, stable for surgery.  I have reviewed the patient's chart and labs.  Questions were answered to the patient's satisfaction.    Cath Lab Visit (complete for each Cath Lab visit)  Clinical Evaluation Leading to the Procedure:   ACS: Yes.    Non-ACS:    Anginal Classification: CCS III  Anti-ischemic medical therapy: Maximal Therapy (2 or more classes of medications)  Non-Invasive Test Results: Intermediate-risk stress test findings: cardiac mortality 1-3%/year  Prior CABG: No previous CABG         Verne Carrowhristopher McAlhany

## 2018-02-02 NOTE — Discharge Summary (Signed)
PATIENT DETAILS Name: Janet [name] Age: 52 y.o. Sex: female Date of Birth: 09-18-1965 MRN: [id number]. Admitting Physician: Lorretta HarpXilin Niu, MD NWG:NFAOZHYPCP:Patient, No Pcp Per  Admit Date: 01/30/2018 Discharge date: 02/02/2018  Recommendations for Outpatient Follow-up:  1. Follow up with PCP in 1-2 weeks 2. Please obtain BMP/CBC in one week 3. Please continue counseling regarding compliance to medication 4. Please ensure follow-up with cardiology  Admitted From:  Home  Disposition: Home   Home Health: No  Equipment/Devices: None  Discharge Condition: Stable  CODE STATUS: FULL CODE  Diet recommendation:  Heart Healthy  Brief Summary: See H&P, Labs, Consult and Test reports for all details in brief, Patient is a [age] y.o. female with with history of CAD-requiring PCI in 2012, hypertension, longstanding history of tobacco use-medication noncompliance for the past 6 months (especially to antihypertensives) presented to the hospital for evaluation of chest pain.  Upon further evaluation she was found to have accelerated hypertension and elevated troponins.  She was subsequently admitted to the hospitalist service.  See below for further details.  Brief Hospital Course: Hypertension:  Controlled-controlled with Coreg, losartan and Maxide.  Follow in the outpatient setting and continue to optimize.   Elevated troponin: Suspect demand ischemia in the setting of uncontrolled hypertension.    Underwent nuclear stress test that was positive for ischemia, equally underwent LHC-see results below-recommendations are for medical management.    CAD-s/p PCI in 2012: See above-stents were patent in LHC done today.  Dyslipidemia: Continue statin  Bipolar disorder: Stable- Continue Depakote and Lexapro.  Tobacco abuse: Counseled  Alcohol abuse: Claims she drinks 1.5 bottles of white wine on a daily basis-her last drink was approximately 3-4 days back.  She is completely awake and  alert-minimal tremors-treated Ativan per protocol.  Noncompliance to medications: Counseled  Procedures/Studies: 12/26>> LHC 1. The left main is patent 2. The LAD has a mild ostial stenosis then moderate mid and distal disease that does not appear to be flow limiting. The distal LAD stenosis has been present since cath in 2015 and actually appears to be improved.  3. The Circumflex has a patent mid stented segment with mild restenosis. The distal AV groove circumflex beyond the last obtuse marginal branch has has chronic diffuse disease. This vessel is too small for PCI.  4. The RCA is a moderate to large caliber dominant vessel with a patent proximal stent with mild restenosis. The mid RCA has serial 50-70% stenoses. DFR (pressure wire assessment) of the mid RCA is 0.94 suggesting the stenoses are not flow limiting.  5. Elevated troponin felt to be due to demand ischemia from hypertensive urgency in the setting of moderate CAD  Recommendations: Medical management of CAD. Better BP control. Tobacco cessation. Follow up with Norma FredricksonLori Gerhardt, NP or Dr. Garnette ScheuermannHank Smith. OK to discharge home later today after bedrest is complete.    Discharge Diagnoses:  Principal Problem:   Hypertensive urgency Active Problems:   CAD (coronary artery disease)   Hypertension   Bipolar disorder, now depressed (HCC)   GERD (gastroesophageal reflux disease)   Tobacco abuse   Chest pain   Chronic diastolic CHF (congestive heart failure) (HCC)   Alcohol abuse   HLD (hyperlipidemia)   Elevated troponin   NSTEMI (non-ST elevated myocardial infarction) [facility](HCC)   Discharge Instructions:  Activity:  As tolerated   Discharge Instructions    Diet - low sodium heart healthy   Complete by:  As directed    Discharge instructions   Complete by:  As  directed    Follow with Primary MD  Patient in 1 week  Follow with your primary cardiologist in 1-2 weeks  Please get a complete blood count and chemistry panel  checked by your Primary MD at your next visit, and again as instructed by your Primary MD.  Get Medicines reviewed and adjusted: Please take all your medications with you for your next visit with your Primary MD  Laboratory/radiological data: Please request your Primary MD to go over all hospital tests and procedure/radiological results at the follow up, please ask your Primary MD to get all Hospital records sent to his/her office.  In some cases, they will be blood work, cultures and biopsy results pending at the time of your discharge. Please request that your primary care M.D. follows up on these results.  Also Note the following: If you experience worsening of your admission symptoms, develop shortness of breath, life threatening emergency, suicidal or homicidal thoughts you must seek medical attention immediately by calling 911 or calling your MD immediately  if symptoms less severe.  You must read complete instructions/literature along with all the possible adverse reactions/side effects for all the Medicines you take and that have been prescribed to you. Take any new Medicines after you have completely understood and accpet all the possible adverse reactions/side effects.   Do not drive when taking Pain medications or sleeping medications (Benzodaizepines)  Do not take more than prescribed Pain, Sleep and Anxiety Medications. It is not advisable to combine anxiety,sleep and pain medications without talking with your primary care practitioner  Special Instructions: If you have smoked or chewed Tobacco  in the last 2 yrs please stop smoking, stop any regular Alcohol  and or any Recreational drug use.  Wear Seat belts while driving.  Please note: You were cared for by a hospitalist during your hospital stay. Once you are discharged, your primary care physician will handle any further medical issues. Please note that NO REFILLS for any discharge medications will be authorized once you are  discharged, as it is imperative that you return to your primary care physician (or establish a relationship with a primary care physician if you do not have one) for your post hospital discharge needs so that they can reassess your need for medications and monitor your lab values.   Increase activity slowly   Complete by:  As directed      Allergies as of 02/02/2018      Reactions   Codeine Nausea And Vomiting   Percocet [oxycodone-acetaminophen] Nausea And Vomiting   Vicodin [hydrocodone-acetaminophen] Nausea And Vomiting   Latex Itching   Reaction to powder in latex gloves      Medication List    STOP taking these medications   diphenhydramine-acetaminophen 25-500 MG Tabs tablet Commonly known as:  TYLENOL PM   isosorbide mononitrate 30 MG 24 hr tablet Commonly known as:  IMDUR   metoprolol tartrate 50 MG tablet Commonly known as:  LOPRESSOR   ondansetron 4 MG tablet Commonly known as:  ZOFRAN     TAKE these medications   albuterol 108 (90 Base) MCG/ACT inhaler Commonly known as:  PROVENTIL HFA;VENTOLIN HFA Inhale 2 puffs into the lungs every 6 (six) hours as needed for wheezing or shortness of breath.   aspirin EC 81 MG tablet Take 1 tablet (81 mg total) by mouth daily.   atorvastatin 40 MG tablet Commonly known as:  LIPITOR Take 1 tablet (40 mg total) by mouth daily.   carvedilol 25 MG tablet  Commonly known as:  COREG Take 1 tablet (25 mg total) by mouth 2 (two) times daily with a meal.   divalproex 500 MG 24 hr tablet Commonly known as:  DEPAKOTE ER Take 2 tablets (1,000 mg total) by mouth at bedtime. For mood control   escitalopram 20 MG tablet Commonly known as:  LEXAPRO Take 1 tablet (20 mg total) by mouth daily.   losartan 100 MG tablet Commonly known as:  COZAAR Take 1 tablet (100 mg total) by mouth daily. Start taking on:  February 03, 2018   pantoprazole 40 MG tablet Commonly known as:  PROTONIX Take 1 tablet (40 mg total) by mouth every  morning.   triamterene-hydrochlorothiazide 37.5-25 MG tablet Commonly known as:  MAXZIDE-25 Take 1 tablet by mouth daily.      Follow-up Information    Primary MD. Schedule an appointment as soon as possible for a visit in 1 week(s).        Lyn RecordsSmith, Henry W, MD. Schedule an appointment as soon as possible for a visit in 2 week(s).   Specialty:  Cardiology Contact information: [address] [place] [id number] [phone]          Allergies  Allergen Reactions  . Codeine Nausea And Vomiting  . Percocet [Oxycodone-Acetaminophen] Nausea And Vomiting  . Vicodin [Hydrocodone-Acetaminophen] Nausea And Vomiting  . Latex Itching    Reaction to powder in latex gloves    Consultations:   cardiology  Other Procedures/Studies: Dg Chest 2 View  Result Date: 01/30/2018 CLINICAL DATA:  Chest pain EXAM: CHEST - 2 VIEW COMPARISON:  07/02/2017 FINDINGS: Lungs are clear.  No pleural effusion or pneumothorax. The heart is normal in size. Visualized osseous structures are within normal limits. IMPRESSION: Normal chest radiographs. Electronically Signed   By: Charline BillsSriyesh  Krishnan M.D.   On: 01/30/2018 18:32   Nm Myocar Multi W/spect W/wall Motion / Ef  Result Date: 01/31/2018 CLINICAL DATA:  52 year old female history of coronary disease with prior percutaneous angioplasty and stenting to the circumflex and RCA. EXAM: MYOCARDIAL IMAGING WITH SPECT (REST AND PHARMACOLOGIC-STRESS) GATED LEFT VENTRICULAR WALL MOTION STUDY LEFT VENTRICULAR EJECTION FRACTION TECHNIQUE: Standard myocardial SPECT imaging was performed after resting intravenous injection of 10 mCi Tc-6267m tetrofosmin. Subsequently, intravenous infusion of Lexiscan was performed under the supervision of the Cardiology staff. At peak effect of the drug, 30 mCi Tc-4567m tetrofosmin was injected intravenously and standard myocardial SPECT imaging was performed. Quantitative gated imaging was also performed to evaluate left  ventricular wall motion, and estimate left ventricular ejection fraction. COMPARISON:  None. FINDINGS: EKG interpretation: No ST changes during pharmacological stress. Perfusion: There is decreased radiotracer activity within the basilar segment of the lateral wall which improves from stress to rest. This is a moderate size defect of moderate to high severity. No additional decrease counts in the LEFT ventricle. Wall Motion: Normal left ventricular wall motion. No left ventricular dilation. Left Ventricular Ejection Fraction: 43 % End diastolic volume 100 ml End systolic volume 57 ml IMPRESSION: 1. Concern for reversible ischemia in the basilar segment of the LEFT lateral wall. Moderate size defect of moderate to high severity. 2. Normal left ventricular wall motion. 3. Left ventricular ejection fraction 43% 4. Non invasive risk stratification*: Intermediate *2012 Appropriate Use Criteria for Coronary Revascularization Focused Update: J Am Coll Cardiol. 2012;59(9):857-881. http://content.dementiazones.comonlinejacc.org/article.aspx?articleid=1201161 These results will be called to the ordering clinician or representative by the Radiologist Assistant, and communication documented in the PACS or zVision Dashboard. Electronically Signed  By: Genevive Bi M.D.   On: 01/31/2018 15:43      TODAY-DAY OF DISCHARGE:  Subjective:   [name] today has no headache,no chest abdominal pain,no new weakness tingling or numbness, feels much better wants to go home today.   Objective:   Blood pressure (!) 141/83, pulse 71, temperature 98.1 F (36.7 C), temperature source Oral, resp. rate 11, height 5\' 1"  (1.549 m), weight 75.6 kg, SpO2 100 %.  Intake/Output Summary (Last 24 hours) at 02/02/2018 1306 Last data filed at 02/02/2018 0900 Gross per 24 hour  Intake 1022.38 ml  Output 1100 ml  Net -77.62 ml   Filed Weights   01/31/18 0438 02/01/18 0402 02/02/18 0410  Weight: 77.7 kg 75.8 kg 75.6 kg    Exam: Awake Alert,  Oriented *3, No new F.N deficits, Normal affect [place].AT,PERRAL Supple Neck,No JVD, No cervical lymphadenopathy appriciated.  Symmetrical Chest wall movement, Good air movement bilaterally, CTAB RRR,No Gallops,Rubs or new Murmurs, No Parasternal Heave +ve B.Sounds, Abd Soft, Non tender, No organomegaly appriciated, No rebound -guarding or rigidity. No Cyanosis, Clubbing or edema, No new Rash or bruise   PERTINENT RADIOLOGIC STUDIES: Dg Chest 2 View  Result Date: 01/30/2018 CLINICAL DATA:  Chest pain EXAM: CHEST - 2 VIEW COMPARISON:  07/02/2017 FINDINGS: Lungs are clear.  No pleural effusion or pneumothorax. The heart is normal in size. Visualized osseous structures are within normal limits. IMPRESSION: Normal chest radiographs. Electronically Signed   By: Charline Bills M.D.   On: 01/30/2018 18:32   Nm Myocar Multi W/spect W/wall Motion / Ef  Result Date: 01/31/2018 CLINICAL DATA:  52 year old female history of coronary disease with prior percutaneous angioplasty and stenting to the circumflex and RCA. EXAM: MYOCARDIAL IMAGING WITH SPECT (REST AND PHARMACOLOGIC-STRESS) GATED LEFT VENTRICULAR WALL MOTION STUDY LEFT VENTRICULAR EJECTION FRACTION TECHNIQUE: Standard myocardial SPECT imaging was performed after resting intravenous injection of 10 mCi Tc-56m tetrofosmin. Subsequently, intravenous infusion of Lexiscan was performed under the supervision of the Cardiology staff. At peak effect of the drug, 30 mCi Tc-30m tetrofosmin was injected intravenously and standard myocardial SPECT imaging was performed. Quantitative gated imaging was also performed to evaluate left ventricular wall motion, and estimate left ventricular ejection fraction. COMPARISON:  None. FINDINGS: EKG interpretation: No ST changes during pharmacological stress. Perfusion: There is decreased radiotracer activity within the basilar segment of the lateral wall which improves from stress to rest. This is a moderate size defect of  moderate to high severity. No additional decrease counts in the LEFT ventricle. Wall Motion: Normal left ventricular wall motion. No left ventricular dilation. Left Ventricular Ejection Fraction: 43 % End diastolic volume 100 ml End systolic volume 57 ml IMPRESSION: 1. Concern for reversible ischemia in the basilar segment of the LEFT lateral wall. Moderate size defect of moderate to high severity. 2. Normal left ventricular wall motion. 3. Left ventricular ejection fraction 43% 4. Non invasive risk stratification*: Intermediate *2012 Appropriate Use Criteria for Coronary Revascularization Focused Update: J Am Coll Cardiol. 2012;59(9):857-881. http://content.dementiazones.com.aspx?articleid=1201161 These results will be called to the ordering clinician or representative by the Radiologist Assistant, and communication documented in the PACS or zVision Dashboard. Electronically Signed   By: Genevive Bi M.D.   On: 01/31/2018 15:43     PERTINENT LAB RESULTS: CBC: Recent Labs    02/01/18 0225 02/02/18 0320  WBC 6.2 5.6  HGB 15.5* 15.9*  HCT 48.6* 50.4*  PLT 288 283   CMET CMP     Component Value Date/Time   NA  133 (L) 02/02/2018 0320   NA 140 02/11/2016 1057   K 4.5 02/02/2018 0320   CL 100 02/02/2018 0320   CO2 22 02/02/2018 0320   GLUCOSE 110 (H) 02/02/2018 0320   BUN 11 02/02/2018 0320   BUN 12 02/11/2016 1057   CREATININE 0.76 02/02/2018 0320   CREATININE 0.72 03/28/2015 1001   CALCIUM 9.3 02/02/2018 0320   PROT 7.1 02/11/2016 1057   ALBUMIN 4.1 02/11/2016 1057   AST 19 02/11/2016 1057   ALT 12 02/11/2016 1057   ALKPHOS 96 02/11/2016 1057   BILITOT 0.3 02/11/2016 1057   GFRNONAA >60 02/02/2018 0320   GFRAA >60 02/02/2018 0320    GFR Estimated Creatinine Clearance: 77.4 mL/min (by C-G formula based on SCr of 0.76 mg/dL). No results for input(s): LIPASE, AMYLASE in the last 72 hours. Recent Labs    01/30/18 2318 01/31/18 0314 01/31/18 0939  TROPONINI 0.23* 0.29*  0.31*   Invalid input(s): POCBNP No results for input(s): DDIMER in the last 72 hours. Recent Labs    01/31/18 0314  HGBA1C 5.4   Recent Labs    01/31/18 0314  CHOL 135  HDL 41  LDLCALC 43  TRIG 256*  CHOLHDL 3.3   No results for input(s): TSH, T4TOTAL, T3FREE, THYROIDAB in the last 72 hours.  Invalid input(s): FREET3 No results for input(s): VITAMINB12, FOLATE, FERRITIN, TIBC, IRON, RETICCTPCT in the last 72 hours. Coags: No results for input(s): INR in the last 72 hours.  Invalid input(s): PT Microbiology: No results found for this or any previous visit (from the past 240 hour(s)).  FURTHER DISCHARGE INSTRUCTIONS:  Get Medicines reviewed and adjusted: Please take all your medications with you for your next visit with your Primary MD  Laboratory/radiological data: Please request your Primary MD to go over all hospital tests and procedure/radiological results at the follow up, please ask your Primary MD to get all Hospital records sent to his/her office.  In some cases, they will be blood work, cultures and biopsy results pending at the time of your discharge. Please request that your primary care M.D. goes through all the records of your hospital data and follows up on these results.  Also Note the following: If you experience worsening of your admission symptoms, develop shortness of breath, life threatening emergency, suicidal or homicidal thoughts you must seek medical attention immediately by calling 911 or calling your MD immediately  if symptoms less severe.  You must read complete instructions/literature along with all the possible adverse reactions/side effects for all the Medicines you take and that have been prescribed to you. Take any new Medicines after you have completely understood and accpet all the possible adverse reactions/side effects.   Do not drive when taking Pain medications or sleeping medications (Benzodaizepines)  Do not take more than  prescribed Pain, Sleep and Anxiety Medications. It is not advisable to combine anxiety,sleep and pain medications without talking with your primary care practitioner  Special Instructions: If you have smoked or chewed Tobacco  in the last 2 yrs please stop smoking, stop any regular Alcohol  and or any Recreational drug use.  Wear Seat belts while driving.  Please note: You were cared for by a hospitalist during your hospital stay. Once you are discharged, your primary care physician will handle any further medical issues. Please note that NO REFILLS for any discharge medications will be authorized once you are discharged, as it is imperative that you return to your primary care physician (or establish a relationship  with a primary care physician if you do not have one) for your post hospital discharge needs so that they can reassess your need for medications and monitor your lab values.  Total Time spent coordinating discharge including counseling, education and face to face time equals 35 minutes.  SignedJeoffrey Massed 02/02/2018 1:06 PM

## 2018-02-02 NOTE — Care Management Note (Signed)
Case Management Note Initial CM note documented by Jacqlyn Krauss RNCM  Patient Details  Name: [name] MRN: [id number] Date of Birth: [birth year]  Subjective/Objective: Pt presented for Hypertension urgency. Per patient she is currently working, however no insurance at this time. Patient is trying to get insurance outside of her job. Pt previously going to [employer] for PCP needs- per pt she will make her follow up appointment. Patient states she uses [employer].                    Action/Plan: No further needs from CM at this time.   Expected Discharge Date:  02/02/18               Expected Discharge Plan:  Home/Self Care  In-House Referral:  NA  Discharge planning Services  CM Consult, Follow-up appt scheduled, [facility], Medication Assistance  Post Acute Care Choice:  NA Choice offered to:  NA  DME Arranged:  N/A DME Agency:  NA  HH Arranged:  NA HH Agency:  NA  Status of Service:  Completed, signed off  If discussed at [place] of Stay Meetings, dates discussed:    Additional Comments: 02/02/18 @ 1501-[name] RNCM-CM met with patient to discuss dispositional needs. Patient agreeable to CM arranging a hospital f/u appointment; Appointment arranged at: [facility] on 02/20/18 @ 0900; CM discussed discounted Rx for $4-$10 with patient stating her ability to afford and will p/u her Rx on 02/03/18; AVS updated. Patient indicated her friend will provide transportation home. No further needs from CM.   [name] RN, BSN, NCM-BC, ACM-RN [phone] 02/02/2018, 2:59 PM

## 2018-02-02 NOTE — Clinical Social Work Note (Signed)
Clinical Social Work Assessment  Patient Details  Name: [name] MRN: [id number] Date of Birth: Aug 02, 1965  Date of referral:  02/02/18               Reason for consult:  Substance Use/ETOH Abuse                Permission sought to share information with:    Permission granted to share information::     Name::        Agency::     Relationship::     Contact Information:     Housing/Transportation Living arrangements for the past 2 months:  Single Family Home Source of Information:  Patient Patient Interpreter Needed:  None Criminal Activity/Legal Involvement Pertinent to Current Situation/Hospitalization:  No - Comment as needed Significant Relationships:  Adult Children Lives with:  Self Do you feel safe going back to the place where you live?  Yes Need for family participation in patient care:  No (Coment)  Care giving concerns: Patient from home. CSW consulted for substance use.   Social Worker assessment / plan: CSW met with patient at bedside. Patient alert and oriented. CSW introduced self and role and discussed reason for consult. Patient reported she uses alcohol and has abstained for the last four days (two of which she's been in the hospital).   Patient indicated she is interested in [employer]. CSW provided website and phone number for assistance in locating [place] meetings. CSW also provided list of inpatient and outpatient substance use treatment resources. Also included information on [employer] of the [place].   Patient appreciative of resources. Patient will discharge today and indicated her daughter will pick her up this evening.  Employment status:    Insurance information:  Self Pay (Medicaid Pending) PT Recommendations:  Not assessed at this time Information / Referral to community resources:  Outpatient Substance Abuse Treatment Options, Residential Substance Abuse Treatment Options, [facility] the [place], Other (Comment  Required)(AA)  Patient/Family's Response to care: Patient appreciative of care.  Patient/Family's Understanding of and Emotional Response to Diagnosis, Current Treatment, and Prognosis: Patient with understanding of her medical conditions. Patient also has motivation to change/reduce her alcohol use and is interested in seeking treatment.  Emotional Assessment Appearance:  Appears stated age Attitude/Demeanor/Rapport:  Engaged Affect (typically observed):  Accepting, Calm, Pleasant, Appropriate Orientation:  Oriented to Self, Oriented to Place, Oriented to  Time, Oriented to Situation Alcohol / Substance use:  Alcohol Use Psych involvement (Current and /or in the community):  No (Comment)  Discharge Needs  Concerns to be addressed:  Substance Abuse Concerns Readmission within the last 30 days:  No Current discharge risk:  Substance Abuse Barriers to Discharge:  No Barriers Identified   Estanislado Emms, LCSW 02/02/2018, 4:15 PM

## 2018-02-02 NOTE — Progress Notes (Signed)
Pt was discharged. Discharge instructions given to the pt with the prescription. Daughter at the bedside.

## 2018-02-02 NOTE — Progress Notes (Signed)
TR BAND REMOVAL  LOCATION:  right radial  DEFLATED PER PROTOCOL:  Yes.    TIME BAND OFF / DRESSING APPLIED:   1800   SITE UPON ARRIVAL:   Level 0  SITE AFTER BAND REMOVAL:  Level 0  CIRCULATION SENSATION AND MOVEMENT:  Within Normal Limits  Yes.    COMMENTS:    

## 2018-02-19 NOTE — Progress Notes (Deleted)
   Subjective:    Patient ID: [name], female    DOB: 08/26/1965, 53 y.o.   MRN: [id number]  53 y.o.F post hosp visit for HTN  Admit Date: 01/30/2018 Discharge date: 02/02/2018  Recommendations for Outpatient Follow-up:  1. Follow up with PCP in 1-2 weeks 2. Please obtain BMP/CBC in one week 3. Please continue counseling regarding compliance to medication 4. Please ensure follow-up with cardiology  Brief Summary: See H&P, Labs, Consult and Test reports for all details in brief, Patient is a52 y.o.femalewith with history of CAD-requiring PCI in 2012, hypertension, longstanding history of tobacco use-medication noncompliance for the past 6 months (especially to antihypertensives) presented to the hospital for evaluation of chest pain. Upon further evaluation she was found to have accelerated hypertension and elevated troponins. She was subsequently admitted to the hospitalist service. See below for further details.  Brief Hospital Course: Hypertension: Controlled-controlled with Coreg, losartan and Maxide.  Follow in the outpatient setting and continue to optimize.   Elevated troponin:Suspect demand ischemia in the setting of uncontrolled hypertension.   Underwent nuclear stress test that was positive for ischemia, equally underwent LHC-see results below-recommendations are for medical management.    CAD-s/pPCI in 2012: See above-stents were patent in LHC done today.  Dyslipidemia: Continue statin  Bipolar disorder:Stable-Continue Depakote and Lexapro.  Tobacco abuse: Counseled  Alcohol abuse:Claims she drinks 1.5 bottles of white wine on a daily basis-her last drink was approximately 3-4 days back. She is completely awake and alert-minimal tremors-treated Ativan per protocol.  Noncompliance to medications: Counseled  Procedures/Studies: 12/26>> LHC 1. The left main is patent 2. The LAD has a mild ostial stenosis then moderate mid and distal disease  that does not appear to be flow limiting. The distal LAD stenosis has been present since cath in 2015 and actually appears to be improved.  3. The Circumflex has a patent mid stented segment with mild restenosis. The distal AV groove circumflex beyond the last obtuse marginal branch has has chronic diffuse disease. This vessel is too small for PCI.  4. The RCA is a moderate to large caliber dominant vessel with a patent proximal stent with mild restenosis. The mid RCA has serial 50-70% stenoses. DFR (pressure wire assessment) of the mid RCA is 0.94 suggesting the stenoses are not flow limiting.  5. Elevated troponin felt to be due to demand ischemia from hypertensive urgency in the setting of moderate CAD  Recommendations: Medical management of CAD. Better BP control. Tobacco cessation. Follow up with Norma Fredrickson, NP or Dr. Garnette Scheuermann. OK to discharge home later today after bedrest is complete.    Discharge Diagnoses:  Principal Problem:   Hypertensive urgency Active Problems:   CAD (coronary artery disease)   Hypertension   Bipolar disorder, now depressed (HCC)   GERD (gastroesophageal reflux disease)   Tobacco abuse   Chest pain   Chronic diastolic CHF (congestive heart failure) (HCC)   Alcohol abuse   HLD (hyperlipidemia)   Elevated troponin   NSTEMI (non-ST elevated myocardial infarction) (HCC)    Hypertension  This is a chronic problem.      Review of Systems     Objective:   Physical Exam        Assessment & Plan:

## 2018-02-20 ENCOUNTER — Inpatient Hospital Stay: Payer: Self-pay | Admitting: Critical Care Medicine

## 2018-06-07 ENCOUNTER — Inpatient Hospital Stay (HOSPITAL_COMMUNITY)
Admission: AD | Admit: 2018-06-07 | Discharge: 2018-06-13 | DRG: 885 | Disposition: A | Discharge status: Home / Self Care | Payer: No Typology Code available for payment source | Source: Intra-hospital | Attending: Psychiatry | Admitting: Psychiatry

## 2018-06-07 ENCOUNTER — Emergency Department (HOSPITAL_COMMUNITY)
Admission: EM | Admit: 2018-06-07 | Discharge: 2018-06-07 | Disposition: A | Discharge status: Other Acute Inpatient Hospital | Payer: Self-pay | Attending: Emergency Medicine | Admitting: Emergency Medicine

## 2018-06-07 ENCOUNTER — Other Ambulatory Visit: Payer: Self-pay

## 2018-06-07 ENCOUNTER — Encounter (HOSPITAL_COMMUNITY): Payer: Self-pay | Admitting: *Deleted

## 2018-06-07 ENCOUNTER — Encounter (HOSPITAL_COMMUNITY): Payer: Self-pay

## 2018-06-07 DIAGNOSIS — Z8249 Family history of ischemic heart disease and other diseases of the circulatory system: Secondary | ICD-10-CM

## 2018-06-07 DIAGNOSIS — Y905 Blood alcohol level of 100-119 mg/100 ml: Secondary | ICD-10-CM | POA: Diagnosis present

## 2018-06-07 DIAGNOSIS — Z801 Family history of malignant neoplasm of trachea, bronchus and lung: Secondary | ICD-10-CM | POA: Diagnosis not present

## 2018-06-07 DIAGNOSIS — F3181 Bipolar II disorder: Secondary | ICD-10-CM | POA: Diagnosis not present

## 2018-06-07 DIAGNOSIS — Z7982 Long term (current) use of aspirin: Secondary | ICD-10-CM

## 2018-06-07 DIAGNOSIS — E785 Hyperlipidemia, unspecified: Secondary | ICD-10-CM | POA: Diagnosis present

## 2018-06-07 DIAGNOSIS — I5032 Chronic diastolic (congestive) heart failure: Secondary | ICD-10-CM | POA: Insufficient documentation

## 2018-06-07 DIAGNOSIS — I110 Hypertensive heart disease with heart failure: Secondary | ICD-10-CM | POA: Insufficient documentation

## 2018-06-07 DIAGNOSIS — Z955 Presence of coronary angioplasty implant and graft: Secondary | ICD-10-CM | POA: Insufficient documentation

## 2018-06-07 DIAGNOSIS — F1020 Alcohol dependence, uncomplicated: Secondary | ICD-10-CM | POA: Diagnosis present

## 2018-06-07 DIAGNOSIS — Z803 Family history of malignant neoplasm of breast: Secondary | ICD-10-CM

## 2018-06-07 DIAGNOSIS — F17210 Nicotine dependence, cigarettes, uncomplicated: Secondary | ICD-10-CM | POA: Diagnosis present

## 2018-06-07 DIAGNOSIS — Z885 Allergy status to narcotic agent status: Secondary | ICD-10-CM

## 2018-06-07 DIAGNOSIS — F332 Major depressive disorder, recurrent severe without psychotic features: Secondary | ICD-10-CM | POA: Diagnosis present

## 2018-06-07 DIAGNOSIS — I10 Essential (primary) hypertension: Secondary | ICD-10-CM

## 2018-06-07 DIAGNOSIS — R45851 Suicidal ideations: Secondary | ICD-10-CM | POA: Insufficient documentation

## 2018-06-07 DIAGNOSIS — F329 Major depressive disorder, single episode, unspecified: Secondary | ICD-10-CM | POA: Insufficient documentation

## 2018-06-07 DIAGNOSIS — I2510 Atherosclerotic heart disease of native coronary artery without angina pectoris: Secondary | ICD-10-CM | POA: Insufficient documentation

## 2018-06-07 DIAGNOSIS — Z91040 Latex allergy status: Secondary | ICD-10-CM | POA: Insufficient documentation

## 2018-06-07 DIAGNOSIS — F419 Anxiety disorder, unspecified: Secondary | ICD-10-CM | POA: Diagnosis present

## 2018-06-07 DIAGNOSIS — F1994 Other psychoactive substance use, unspecified with psychoactive substance-induced mood disorder: Secondary | ICD-10-CM | POA: Diagnosis present

## 2018-06-07 DIAGNOSIS — Z981 Arthrodesis status: Secondary | ICD-10-CM | POA: Diagnosis not present

## 2018-06-07 DIAGNOSIS — F319 Bipolar disorder, unspecified: Secondary | ICD-10-CM | POA: Diagnosis present

## 2018-06-07 DIAGNOSIS — E781 Pure hyperglyceridemia: Secondary | ICD-10-CM | POA: Diagnosis present

## 2018-06-07 DIAGNOSIS — F909 Attention-deficit hyperactivity disorder, unspecified type: Secondary | ICD-10-CM | POA: Diagnosis present

## 2018-06-07 DIAGNOSIS — F1024 Alcohol dependence with alcohol-induced mood disorder: Secondary | ICD-10-CM | POA: Diagnosis not present

## 2018-06-07 DIAGNOSIS — I25119 Atherosclerotic heart disease of native coronary artery with unspecified angina pectoris: Secondary | ICD-10-CM | POA: Diagnosis present

## 2018-06-07 DIAGNOSIS — Z9114 Patient's other noncompliance with medication regimen: Secondary | ICD-10-CM | POA: Insufficient documentation

## 2018-06-07 DIAGNOSIS — Z818 Family history of other mental and behavioral disorders: Secondary | ICD-10-CM

## 2018-06-07 DIAGNOSIS — I252 Old myocardial infarction: Secondary | ICD-10-CM | POA: Diagnosis not present

## 2018-06-07 DIAGNOSIS — Z87891 Personal history of nicotine dependence: Secondary | ICD-10-CM | POA: Insufficient documentation

## 2018-06-07 HISTORY — DX: Major depressive disorder, recurrent severe without psychotic features: F33.2

## 2018-06-07 LAB — CBC WITH DIFFERENTIAL/PLATELET
Abs Immature Granulocytes: 0.01 10*3/uL (ref 0.00–0.07)
Basophils Absolute: 0 10*3/uL (ref 0.0–0.1)
Basophils Relative: 1 %
Eosinophils Absolute: 0.1 10*3/uL (ref 0.0–0.5)
Eosinophils Relative: 2 %
HCT: 51.2 % — ABNORMAL HIGH (ref 36.0–46.0)
Hemoglobin: 15.9 g/dL — ABNORMAL HIGH (ref 12.0–15.0)
Immature Granulocytes: 0 %
Lymphocytes Relative: 25 %
Lymphs Abs: 1.4 10*3/uL (ref 0.7–4.0)
MCH: 28.1 pg (ref 26.0–34.0)
MCHC: 31.1 g/dL (ref 30.0–36.0)
MCV: 90.5 fL (ref 80.0–100.0)
Monocytes Absolute: 0.3 10*3/uL (ref 0.1–1.0)
Monocytes Relative: 6 %
Neutro Abs: 3.7 10*3/uL (ref 1.7–7.7)
Neutrophils Relative %: 66 %
Platelets: 266 10*3/uL (ref 150–400)
RBC: 5.66 MIL/uL — ABNORMAL HIGH (ref 3.87–5.11)
RDW: 13.2 % (ref 11.5–15.5)
WBC: 5.6 10*3/uL (ref 4.0–10.5)
nRBC: 0 % (ref 0.0–0.2)

## 2018-06-07 LAB — BASIC METABOLIC PANEL
Anion gap: 12 (ref 5–15)
BUN: 10 mg/dL (ref 6–20)
CO2: 20 mmol/L — ABNORMAL LOW (ref 22–32)
Calcium: 8.8 mg/dL — ABNORMAL LOW (ref 8.9–10.3)
Chloride: 112 mmol/L — ABNORMAL HIGH (ref 98–111)
Creatinine, Ser: 0.88 mg/dL (ref 0.44–1.00)
GFR calc Af Amer: >60 mL/min (ref >60)
GFR calc non Af Amer: >60 mL/min (ref >60)
Glucose, Bld: 103 mg/dL — ABNORMAL HIGH (ref 70–99)
Potassium: 3.5 mmol/L (ref 3.5–5.1)
Sodium: 144 mmol/L (ref 135–145)

## 2018-06-07 LAB — URINALYSIS, ROUTINE W REFLEX MICROSCOPIC
Bilirubin Urine: NEGATIVE
Glucose, UA: NEGATIVE mg/dL
Hgb urine dipstick: NEGATIVE
Ketones, ur: 5 mg/dL — AB
Leukocytes,Ua: NEGATIVE
Nitrite: NEGATIVE
Protein, ur: NEGATIVE mg/dL
Specific Gravity, Urine: 1.016 (ref 1.005–1.030)
pH: 6 (ref 5.0–8.0)

## 2018-06-07 LAB — RAPID URINE DRUG SCREEN, HOSP PERFORMED
Amphetamines: NOT DETECTED
Barbiturates: NOT DETECTED
Benzodiazepines: NOT DETECTED
Cocaine: NOT DETECTED
Opiates: NOT DETECTED
Tetrahydrocannabinol: NOT DETECTED

## 2018-06-07 LAB — ETHANOL: Alcohol, Ethyl (B): 110 mg/dL — ABNORMAL HIGH (ref <10)

## 2018-06-07 MED ORDER — ASPIRIN EC 81 MG PO TBEC
81.0000 mg | DELAYED_RELEASE_TABLET | Freq: Every day | ORAL | Status: DC
  Administered 2018-06-07: 81 mg via ORAL
  Filled 2018-06-07: qty 1

## 2018-06-07 MED ORDER — LORAZEPAM 2 MG/ML IJ SOLN: 0.0000 mg | Freq: Four times a day (QID) | INTRAMUSCULAR | Status: DC

## 2018-06-07 MED ORDER — ADULT MULTIVITAMIN W/MINERALS CH
1.0000 | ORAL_TABLET | Freq: Every day | ORAL | Status: DC
  Administered 2018-06-08 – 2018-06-13 (×6): 1 via ORAL
  Filled 2018-06-07 (×7): qty 1

## 2018-06-07 MED ORDER — CARVEDILOL 12.5 MG PO TABS
25.0000 mg | ORAL_TABLET | Freq: Two times a day (BID) | ORAL | Status: DC
  Administered 2018-06-07: 25 mg via ORAL
  Filled 2018-06-07: qty 2

## 2018-06-07 MED ORDER — MAGNESIUM HYDROXIDE 400 MG/5ML PO SUSP: 30.0000 mL | Freq: Every day | ORAL | Status: DC | PRN

## 2018-06-07 MED ORDER — HYDROXYZINE HCL 25 MG PO TABS
25.0000 mg | ORAL_TABLET | Freq: Four times a day (QID) | ORAL | Status: AC | PRN
  Administered 2018-06-07 – 2018-06-09 (×2): 25 mg via ORAL
  Filled 2018-06-07 (×2): qty 1

## 2018-06-07 MED ORDER — ATORVASTATIN CALCIUM 40 MG PO TABS
40.0000 mg | ORAL_TABLET | Freq: Every day | ORAL | Status: DC
  Administered 2018-06-07: 40 mg via ORAL
  Filled 2018-06-07: qty 1

## 2018-06-07 MED ORDER — CLONIDINE HCL 0.1 MG PO TABS
0.1000 mg | ORAL_TABLET | Freq: Once | ORAL | Status: AC
  Administered 2018-06-07: 0.1 mg via ORAL
  Filled 2018-06-07: qty 1

## 2018-06-07 MED ORDER — LOPERAMIDE HCL 2 MG PO CAPS: 2.0000 mg | ORAL_CAPSULE | ORAL | Status: AC | PRN

## 2018-06-07 MED ORDER — VITAMIN B-1 100 MG PO TABS
100.0000 mg | ORAL_TABLET | Freq: Every day | ORAL | Status: DC
  Administered 2018-06-07: 100 mg via ORAL
  Filled 2018-06-07: qty 1

## 2018-06-07 MED ORDER — LORAZEPAM 2 MG/ML IJ SOLN: 1.0000 mg | Freq: Once | INTRAMUSCULAR | Status: DC

## 2018-06-07 MED ORDER — TRIAMTERENE-HCTZ 37.5-25 MG PO TABS
1.0000 | ORAL_TABLET | Freq: Every day | ORAL | Status: DC
  Administered 2018-06-07: 1 via ORAL
  Filled 2018-06-07: qty 1

## 2018-06-07 MED ORDER — THIAMINE HCL 100 MG/ML IJ SOLN: 100.0000 mg | Freq: Every day | INTRAMUSCULAR | Status: DC

## 2018-06-07 MED ORDER — THIAMINE HCL 100 MG/ML IJ SOLN: 100.0000 mg | Freq: Once | INTRAMUSCULAR | Status: DC

## 2018-06-07 MED ORDER — ONDANSETRON 4 MG PO TBDP: 4.0000 mg | ORAL_TABLET | Freq: Four times a day (QID) | ORAL | Status: AC | PRN

## 2018-06-07 MED ORDER — LORAZEPAM 1 MG PO TABS
1.0000 mg | ORAL_TABLET | Freq: Four times a day (QID) | ORAL | Status: DC | PRN
  Administered 2018-06-07: 1 mg via ORAL
  Filled 2018-06-07: qty 1

## 2018-06-07 MED ORDER — ALUM & MAG HYDROXIDE-SIMETH 200-200-20 MG/5ML PO SUSP: 30.0000 mL | ORAL | Status: DC | PRN

## 2018-06-07 MED ORDER — PANTOPRAZOLE SODIUM 40 MG PO TBEC: 40.0000 mg | DELAYED_RELEASE_TABLET | ORAL | Status: DC

## 2018-06-07 MED ORDER — DIVALPROEX SODIUM ER 500 MG PO TB24: 1000.0000 mg | ORAL_TABLET | Freq: Every day | ORAL | Status: DC

## 2018-06-07 MED ORDER — LORAZEPAM 1 MG PO TABS
0.0000 mg | ORAL_TABLET | Freq: Four times a day (QID) | ORAL | Status: DC
  Administered 2018-06-07: 1 mg via ORAL
  Filled 2018-06-07: qty 1

## 2018-06-07 MED ORDER — TRAZODONE HCL 50 MG PO TABS
50.0000 mg | ORAL_TABLET | Freq: Every evening | ORAL | Status: DC | PRN
  Filled 2018-06-07 (×7): qty 1

## 2018-06-07 MED ORDER — LOSARTAN POTASSIUM 50 MG PO TABS
100.0000 mg | ORAL_TABLET | Freq: Every day | ORAL | Status: DC
  Administered 2018-06-07: 100 mg via ORAL
  Filled 2018-06-07: qty 2

## 2018-06-07 MED ORDER — LORAZEPAM 2 MG/ML IJ SOLN: 0.0000 mg | Freq: Two times a day (BID) | INTRAMUSCULAR | Status: DC

## 2018-06-07 MED ORDER — ESCITALOPRAM OXALATE 10 MG PO TABS
20.0000 mg | ORAL_TABLET | Freq: Every day | ORAL | Status: DC
  Administered 2018-06-07: 20 mg via ORAL
  Filled 2018-06-07: qty 2

## 2018-06-07 MED ORDER — LORAZEPAM 1 MG PO TABS: 0.0000 mg | ORAL_TABLET | Freq: Two times a day (BID) | ORAL | Status: DC

## 2018-06-07 MED ORDER — VITAMIN B-1 100 MG PO TABS
100.0000 mg | ORAL_TABLET | Freq: Every day | ORAL | Status: DC
  Administered 2018-06-08 – 2018-06-13 (×6): 100 mg via ORAL
  Filled 2018-06-07 (×7): qty 1

## 2018-06-07 NOTE — ED Notes (Addendum)
Dr. Donnald Garre notified of pt's BP prior to transport to [facility].  Medication ordered.  Penny at [facility] updated.  Pt transported via [facility] at this time.  All belongings returned to pt and pt signed.

## 2018-06-07 NOTE — ED Notes (Signed)
Reports being out of lexapro and depakote.  Reports increased alcohol intake recently, reports 4 glasses of chardonnay today before admittions

## 2018-06-07 NOTE — ED Notes (Signed)
Called [facility] to transfer to [facility]
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
## 2018-06-07 NOTE — ED Notes (Signed)
Pt attempted to call her daughter but the phone was busy.  States she will call her later.  Explained to pt that she is waiting on transportation to [facility].  Voluntary admission and consent for treatment form signed and placed in chart.
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
## 2018-06-07 NOTE — BH Assessment (Signed)
Pt recommended IP tx per Dr. Jama Flavors due to [facility] with plan . Accepted [facility]- RO[facility] 305-2. Attending - DR COBOS Pt to sign VOL consent and fax to [phone]. RN to RN report [phone]. Report must be called prior to patient being picked up for transport.  No signed note indicating patient is medically cleared, once medically cleared Night AC to coordinate transfer, bed is available.  Clydie Braun CN aware of pending bed .

## 2018-06-07 NOTE — Tx Team (Signed)
Initial Treatment Plan 06/07/2018 11:28 PM [name] EXN:[id number]    PATIENT STRESSORS: Medication change or noncompliance Substance abuse   PATIENT STRENGTHS: Capable of independent living Financial means General fund of knowledge   PATIENT IDENTIFIED PROBLEMS: "get back on medications"  "stop using ETOH"                   DISCHARGE CRITERIA:  Adequate post-discharge living arrangements Improved stabilization in mood, thinking, and/or behavior Verbal commitment to aftercare and medication compliance  PRELIMINARY DISCHARGE PLAN: Attend aftercare/continuing care group Attend 12-step recovery group Return to previous living arrangement  PATIENT/FAMILY INVOLVEMENT: This treatment plan has been presented to and reviewed with the patient, [name], and/or family member, .  The patient and family have been given the opportunity to ask questions and make suggestions.  Andrena Mews, RN 06/07/2018, 11:28 PM

## 2018-06-07 NOTE — ED Triage Notes (Signed)
Has been suicidal since yesterday and wants to run her car into another car to hurt herself.  She has been out of her medicine for a month and has called but not been able to connect with an appt.

## 2018-06-08 DIAGNOSIS — F332 Major depressive disorder, recurrent severe without psychotic features: Secondary | ICD-10-CM

## 2018-06-08 MED ORDER — LOSARTAN POTASSIUM 50 MG PO TABS
100.0000 mg | ORAL_TABLET | Freq: Every day | ORAL | Status: DC
  Administered 2018-06-09 – 2018-06-11 (×3): 100 mg via ORAL
  Filled 2018-06-08 (×6): qty 2

## 2018-06-08 MED ORDER — ZIPRASIDONE HCL 20 MG PO CAPS
20.0000 mg | ORAL_CAPSULE | Freq: Two times a day (BID) | ORAL | Status: DC
  Administered 2018-06-08 – 2018-06-10 (×4): 20 mg via ORAL
  Filled 2018-06-08 (×6): qty 1

## 2018-06-08 MED ORDER — CLONIDINE HCL 0.1 MG PO TABS
0.2000 mg | ORAL_TABLET | ORAL | Status: DC | PRN
  Administered 2018-06-11: 0.2 mg via ORAL
  Filled 2018-06-08: qty 2

## 2018-06-08 MED ORDER — ESCITALOPRAM OXALATE 10 MG PO TABS
10.0000 mg | ORAL_TABLET | Freq: Every day | ORAL | Status: DC
  Administered 2018-06-08 – 2018-06-09 (×2): 10 mg via ORAL
  Filled 2018-06-08 (×3): qty 1

## 2018-06-08 MED ORDER — CHLORDIAZEPOXIDE HCL 25 MG PO CAPS
25.0000 mg | ORAL_CAPSULE | Freq: Three times a day (TID) | ORAL | Status: AC
  Administered 2018-06-09 (×2): 25 mg via ORAL
  Filled 2018-06-08: qty 1

## 2018-06-08 MED ORDER — TRIAMTERENE-HCTZ 37.5-25 MG PO TABS
1.0000 | ORAL_TABLET | Freq: Every day | ORAL | Status: DC
  Administered 2018-06-08 – 2018-06-13 (×6): 1 via ORAL
  Filled 2018-06-08 (×2): qty 14
  Filled 2018-06-08 (×6): qty 1

## 2018-06-08 MED ORDER — TOPIRAMATE 25 MG PO TABS
50.0000 mg | ORAL_TABLET | Freq: Two times a day (BID) | ORAL | Status: DC
  Filled 2018-06-08 (×5): qty 2

## 2018-06-08 MED ORDER — GABAPENTIN 300 MG PO CAPS
300.0000 mg | ORAL_CAPSULE | Freq: Three times a day (TID) | ORAL | Status: DC
  Administered 2018-06-08 – 2018-06-13 (×14): 300 mg via ORAL
  Filled 2018-06-08 (×6): qty 1
  Filled 2018-06-08: qty 42
  Filled 2018-06-08 (×3): qty 1
  Filled 2018-06-08: qty 42
  Filled 2018-06-08 (×4): qty 1
  Filled 2018-06-08: qty 42
  Filled 2018-06-08: qty 1
  Filled 2018-06-08 (×2): qty 42

## 2018-06-08 MED ORDER — CHLORDIAZEPOXIDE HCL 25 MG PO CAPS
25.0000 mg | ORAL_CAPSULE | ORAL | Status: AC
  Administered 2018-06-10 (×2): 25 mg via ORAL
  Filled 2018-06-08 (×2): qty 1

## 2018-06-08 MED ORDER — ADULT MULTIVITAMIN W/MINERALS CH: 1.0000 | ORAL_TABLET | Freq: Every day | ORAL | Status: DC

## 2018-06-08 MED ORDER — CHLORDIAZEPOXIDE HCL 25 MG PO CAPS
25.0000 mg | ORAL_CAPSULE | Freq: Every day | ORAL | Status: AC
  Administered 2018-06-11: 25 mg via ORAL
  Filled 2018-06-08 (×3): qty 1

## 2018-06-08 MED ORDER — METOPROLOL SUCCINATE ER 50 MG PO TB24
100.0000 mg | ORAL_TABLET | Freq: Every day | ORAL | Status: DC
  Administered 2018-06-08 – 2018-06-13 (×6): 100 mg via ORAL
  Filled 2018-06-08 (×2): qty 2
  Filled 2018-06-08: qty 28
  Filled 2018-06-08: qty 2
  Filled 2018-06-08: qty 28
  Filled 2018-06-08 (×3): qty 2

## 2018-06-08 MED ORDER — CHLORDIAZEPOXIDE HCL 25 MG PO CAPS
25.0000 mg | ORAL_CAPSULE | Freq: Four times a day (QID) | ORAL | Status: AC | PRN
  Administered 2018-06-10: 25 mg via ORAL

## 2018-06-08 MED ORDER — CHLORDIAZEPOXIDE HCL 25 MG PO CAPS
25.0000 mg | ORAL_CAPSULE | Freq: Four times a day (QID) | ORAL | Status: AC
  Administered 2018-06-08 (×4): 25 mg via ORAL
  Filled 2018-06-08 (×5): qty 1

## 2018-06-08 NOTE — [facility] Suicide Risk Assessment (Signed)
[facility] INPATIENT:  Family/Significant Other Suicide Prevention Education  Suicide Prevention Education:  Education Completed; Darcell Swasey, daughter, [phone], has been identified by the patient as the family member/significant other with whom the patient will be residing, and identified as the person(s) who will aid the patient in the event of a mental health crisis (suicidal ideations/suicide attempt).  With written consent from the patient, the family member/significant other has been provided the following suicide prevention education, prior to the and/or following the discharge of the patient.  The suicide prevention education provided includes the following:  Suicide risk factors  Suicide prevention and interventions  National Suicide Hotline telephone number  [facility] assessment telephone number  [facility] Emergency Assistance 911  [facility] and/or Residential Mobile Crisis Unit telephone number  Request made of family/significant other to:  Remove weapons (e.g., guns, rifles, knives), all items previously/currently identified as safety concern.  No guns in the home, per daughter.   Remove drugs/medications (over-the-counter, prescriptions, illicit drugs), all items previously/currently identified as a safety concern.  The family member/significant other verbalizes understanding of the suicide prevention education information provided.  The family member/significant other agrees to remove the items of safety concern listed above.  Daughter reports she and pt have been "butting heads" a lot lately.  Pt will say she is going to stop drinking and then is sneaking drinks at the house regularly.  Daughter did speak to pt today, pt was sleepy, said she had started meds.  Daughter hoping she can go to residential treatment.   Lorri Frederick, LCSW  06/08/2018, 3:10 PM

## 2018-06-08 NOTE — Progress Notes (Signed)
Nursing Note: 0700-1900  D:  Pt in bed, woken at 1030 for meds. She presents with depressed/anxious mood. "I have been pretending that everything is ok on the outside, but when I get home I am sad and hopeless.  I can't handle stress with my daughter yelling at home and I feel like I need to drink."  Pt states that she used to take Depakote and Lexapro but has substitued meds with alcohol for the past year.  "I'm an alcoholic.  But I was sober for 20 years."  My daughter has a 71 month old and an 73 year old, she lives with me.  She was always difficult and I think she might have Bipolar too."  Pt verbalizes desire to get back on Lexapro but is not sure about Depakote.  BP elevated, MD notified and to evaluate her this afternoon (pt was asleep when he first assessed her.)  Librium given as scheduled, pt c/o feeling very anxious and shaky.  A:  Encouraged to verbalize needs and concerns, active listening and support provided.  Continued Q 15 minute safety checks.    R:  Pt. is cooperative, stayed in room most of shift.  "It is time that I take care of myself now."  Denies A/V hallucinations and is able to verbally contract for safety.

## 2018-06-08 NOTE — Progress Notes (Signed)
The focus of this group is to help patients establish daily goals to achieve during treatment and discuss how the patient can incorporate goal setting into their daily lives to aide in recovery. ° °Pt did not attend goals group °

## 2018-06-08 NOTE — Progress Notes (Signed)
[place] NOVEL CORONAVIRUS (COVID-19) DAILY CHECK-OFF SYMPTOMS - answer yes or no to each - every day NO YES  Have you had a fever in the past 24 hours?  . Fever (Temp > 37.80C / 100F) X   Have you had any of these symptoms in the past 24 hours? . New Cough .  Sore Throat  .  Shortness of Breath .  Difficulty Breathing .  Unexplained Body Aches   X   Have you had any one of these symptoms in the past 24 hours not related to allergies?   . Runny Nose .  Nasal Congestion .  Sneezing   X   If you have had runny nose, nasal congestion, sneezing in the past 24 hours, has it worsened?  X   EXPOSURES - check yes or no X   Have you traveled outside the state in the past 14 days?  X   Have you been in contact with someone with a confirmed diagnosis of COVID-19 or PUI in the past 14 days without wearing appropriate PPE?  X   Have you been living in the same home as a person with confirmed diagnosis of COVID-19 or a PUI (household contact)?    X   Have you been diagnosed with COVID-19?    X              What to do next: Answered NO to all: Answered YES to anything:   Proceed with unit schedule Follow the [facility] Inpatient Flowsheet.   
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
## 2018-06-08 NOTE — [facility] Suicide Risk Assessment (Signed)
[facility] Admission Suicide Risk Assessment   Nursing information obtained from:  Patient Demographic factors:  NA Current Mental Status:  Suicidal ideation indicated by patient Loss Factors:  NA Historical Factors:  NA Risk Reduction Factors:  Employed, Sense of responsibility to family  Total Time spent with patient: 45 minutes Principal Problem: Alcohol dependence and depression Diagnosis:  Active Problems:   MDD (major depressive disorder), recurrent episode, severe (HCC)  Subjective Data: Current concerns about safety related to alcohol dependence and depression  Continued Clinical Symptoms:  Alcohol Use Disorder Identification Test Final Score (AUDIT): 33 The "Alcohol Use Disorders Identification Test", Guidelines for Use in Primary Care, Second Edition.  World Science writer [facility]). Score between 0-7:  no or low risk or alcohol related problems. Score between 8-15:  moderate risk of alcohol related problems. Score between 16-19:  high risk of alcohol related problems. Score 20 or above:  warrants further diagnostic evaluation for alcohol dependence and treatment.   CLINICAL FACTORS:   Depression:   Severe  COGNITIVE FEATURES THAT CONTRIBUTE TO RISK:  None    SUICIDE RISK:   Minimal: No identifiable suicidal ideation.  Patients presenting with no risk factors but with morbid ruminations; may be classified as minimal risk based on the severity of the depressive symptoms  PLAN OF CARE: Tox underway discuss antidepressants and antihypertensive therapy further  I certify that inpatient services furnished can reasonably be expected to improve the patient's condition.   Malvin Johns, MD 06/08/2018, 1:21 PM
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
## 2018-06-08 NOTE — Progress Notes (Signed)
[facility] NOVEL CORONAVIRUS (COVID-19) DAILY CHECK-OFF SYMPTOMS - answer yes or no to each - every day NO YES  Have you had a fever in the past 24 hours?  . Fever (Temp > 37.80C / 100F) X   Have you had any of these symptoms in the past 24 hours? . New Cough .  Sore Throat  .  Shortness of Breath .  Difficulty Breathing .  Unexplained Body Aches   X   Have you had any one of these symptoms in the past 24 hours not related to allergies?   . Runny Nose .  Nasal Congestion .  Sneezing   X   If you have had runny nose, nasal congestion, sneezing in the past 24 hours, has it worsened?  X   EXPOSURES - check yes or no X   Have you traveled outside the state in the past 14 days?  X   Have you been in contact with someone with a confirmed diagnosis of COVID-19 or PUI in the past 14 days without wearing appropriate PPE?  X   Have you been living in the same home as a person with confirmed diagnosis of COVID-19 or a PUI (household contact)?    X   Have you been diagnosed with COVID-19?    X              What to do next: Answered NO to all: Answered YES to anything:   Proceed with unit schedule Follow the [facility] Inpatient Flowsheet.   

## 2018-06-09 MED ORDER — BUSPIRONE HCL 15 MG PO TABS
15.0000 mg | ORAL_TABLET | Freq: Three times a day (TID) | ORAL | Status: DC
  Administered 2018-06-09 – 2018-06-10 (×3): 15 mg via ORAL
  Filled 2018-06-09 (×6): qty 1

## 2018-06-09 MED ORDER — ESCITALOPRAM OXALATE 20 MG PO TABS
20.0000 mg | ORAL_TABLET | Freq: Every day | ORAL | Status: DC
  Administered 2018-06-10: 20 mg via ORAL
  Filled 2018-06-09 (×2): qty 1

## 2018-06-09 NOTE — Progress Notes (Signed)
Recreation Therapy Notes  Date:  5.1.20 Time: 0930 Location: 300 Hall Dayroom  Group Topic: Stress Management  Goal Area(s) Addresses:  Patient will identify positive stress management techniques. Patient will identify benefits of using stress management post d/c.  Intervention:  Stress Management  Activity :  Meditation.  LRT introduced the stress management technique of meditation.  Meditation focused on making the most of the and the possibilities that come with it.  Patients were to follow along as meditation played in order to engage in activity.  Education:  Stress Management, Discharge Planning.   Education Outcome: Acknowledges Education  Clinical Observations/Feedback:  Pt did not attend group.    Caroll Rancher, LRT/CTRS         Caroll Rancher A 06/09/2018 10:54 AM

## 2018-06-09 NOTE — Progress Notes (Signed)
Patient wishes to pursue residential substance use treatment. Patient has a screening at [facility] residential scheduled for Tuesday, 06/13/2018 at 7:45am.  Enid Cutter, LCSW-A Clinical Social Worker

## 2018-06-09 NOTE — Progress Notes (Signed)
D Pt is observed OOB UAL on the 300 hall tolerated fair. She is paranoid, she is hesitant to ask question she is shy and nervous.     A She completes her daily assessment and on this she wrote she denies SI today and she rates her depression, hopelessness and anxiety " 2/8/2", respectively. !700 dose of librium held due to pt request ( pt felt oversedated).     R Safety .

## 2018-06-09 NOTE — Progress Notes (Signed)
D: Pt was in bed in her room upon initial approach.  Pt presents with depressed affect and mood.  She forwards little information.  She describes her day as "okay" and denies having a goal tonight.  Pt denies SI/HI, denies hallucinations, denies pain.  Pt has been isolative to her room for the majority of the night.  She did come to the medication window for HS medication after multiple prompts.   A: Introduced self to pt.  Met with pt 1:1.  Actively listened to pt and offered support and encouragement.  Scheduled Cozaar held because diastolic BP of 52 mm Hg.  PO fluids encouraged and provided.  Fall prevention techniques reviewed with pt and she verbalized understanding.  Q15 minute safety checks maintained.  R: Pt is safe on the unit.  Pt is compliant with medication except for scheduled Trazodone, which she declined tonight.  Pt verbally contracts for safety.  Will continue to monitor and assess.
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
## 2018-06-09 NOTE — Tx Team (Signed)
Interdisciplinary Treatment and Diagnostic Plan Update  06/09/2018 Time of Session: 10:00am [name] MRN: [id number]  Principal Diagnosis: <principal problem not specified>  Secondary Diagnoses: Active Problems:   MDD (major depressive disorder), recurrent episode, severe (HCC)   Current Medications:  Current Facility-Administered Medications  Medication Dose Route Frequency Provider Last Rate Last Dose  . alum & mag hydroxide-simeth (MAALOX/MYLANTA) 200-200-20 MG/5ML suspension 30 mL  30 mL Oral Q4H PRN Kerry Hough, PA-C      . busPIRone (BUSPAR) tablet 15 mg  15 mg Oral TID Malvin Johns, MD      . chlordiazePOXIDE (LIBRIUM) capsule 25 mg  25 mg Oral Q6H PRN Malvin Johns, MD      . chlordiazePOXIDE (LIBRIUM) capsule 25 mg  25 mg Oral TID Malvin Johns, MD   25 mg at 06/09/18 1478   Followed by  . [START ON 06/10/2018] chlordiazePOXIDE (LIBRIUM) capsule 25 mg  25 mg Oral Nicki Guadalajara, MD       Followed by  . [START ON 06/11/2018] chlordiazePOXIDE (LIBRIUM) capsule 25 mg  25 mg Oral Daily Malvin Johns, MD      . cloNIDine (CATAPRES) tablet 0.2 mg  0.2 mg Oral Q4H PRN Malvin Johns, MD      . Melene Muller ON 06/10/2018] escitalopram (LEXAPRO) tablet 20 mg  20 mg Oral Daily Malvin Johns, MD      . gabapentin (NEURONTIN) capsule 300 mg  300 mg Oral TID Malvin Johns, MD   300 mg at 06/09/18 0820  . hydrOXYzine (ATARAX/VISTARIL) tablet 25 mg  25 mg Oral Q6H PRN Donell Sievert E, PA-C   25 mg at 06/09/18 0820  . loperamide (IMODIUM) capsule 2-4 mg  2-4 mg Oral PRN Kerry Hough, PA-C      . losartan (COZAAR) tablet 100 mg  100 mg Oral Daily Malvin Johns, MD   100 mg at 06/09/18 0820  . magnesium hydroxide (MILK OF MAGNESIA) suspension 30 mL  30 mL Oral Daily PRN Donell Sievert E, PA-C      . metoprolol succinate (TOPROL-XL) 24 hr tablet 100 mg  100 mg Oral Daily Malvin Johns, MD   100 mg at 06/09/18 0820  . multivitamin with minerals tablet 1 tablet  1 tablet Oral Daily Kerry Hough,  PA-C   1 tablet at 06/09/18 2956  . ondansetron (ZOFRAN-ODT) disintegrating tablet 4 mg  4 mg Oral Q6H PRN Donell Sievert E, PA-C      . thiamine (B-1) injection 100 mg  100 mg Intramuscular Once Donell Sievert E, PA-C      . thiamine (VITAMIN B-1) tablet 100 mg  100 mg Oral Daily Donell Sievert E, PA-C   100 mg at 06/09/18 2130  . traZODone (DESYREL) tablet 50 mg  50 mg Oral QHS,MR X 1 Simon, Mena Goes, PA-C      . triamterene-hydrochlorothiazide (MAXZIDE-25) 37.5-25 MG per tablet 1 tablet  1 tablet Oral Daily Malvin Johns, MD   1 tablet at 06/09/18 [phone]  . ziprasidone (GEODON) capsule 20 mg  20 mg Oral BID WC Malvin Johns, MD   20 mg at 06/09/18 0820   PTA Medications: Medications Prior to Admission  Medication Sig Dispense Refill Last Dose  . albuterol (PROVENTIL HFA;VENTOLIN HFA) 108 (90 Base) MCG/ACT inhaler Inhale 2 puffs into the lungs every 6 (six) hours as needed for wheezing or shortness of breath. (Patient not taking: Reported on 06/07/2018) 1 Inhaler 0 Not Taking at Unknown time  . aspirin EC 81 MG tablet  Take 1 tablet (81 mg total) by mouth daily. 90 tablet 0 06/06/2018 at Unknown time  . atorvastatin (LIPITOR) 40 MG tablet Take 1 tablet (40 mg total) by mouth daily. (Patient not taking: Reported on 06/07/2018) 90 tablet 0 Not Taking at Unknown time  . carvedilol (COREG) 25 MG tablet Take 1 tablet (25 mg total) by mouth 2 (two) times daily with a meal. (Patient not taking: Reported on 06/07/2018) 60 tablet 0 Not Taking at Unknown time  . divalproex (DEPAKOTE ER) 500 MG 24 hr tablet Take 2 tablets (1,000 mg total) by mouth at bedtime. For mood control (Patient not taking: Reported on 06/07/2018) 30 tablet 0 Not Taking at Unknown time  . escitalopram (LEXAPRO) 20 MG tablet Take 1 tablet (20 mg total) by mouth daily. (Patient not taking: Reported on 06/07/2018) 90 tablet 0 Not Taking at Unknown time  . losartan (COZAAR) 100 MG tablet Take 1 tablet (100 mg total) by mouth daily. (Patient not  taking: Reported on 06/07/2018) 30 tablet 0 Not Taking at Unknown time  . pantoprazole (PROTONIX) 40 MG tablet Take 1 tablet (40 mg total) by mouth every morning. (Patient not taking: Reported on 06/07/2018) 30 tablet 0 Not Taking at Unknown time  . triamterene-hydrochlorothiazide (MAXZIDE-25) 37.5-25 MG tablet Take 1 tablet by mouth daily. (Patient not taking: Reported on 06/07/2018) 30 tablet 0 Not Taking at Unknown time    Patient Stressors: Medication change or noncompliance Substance abuse  Patient Strengths: Capable of independent living Astronomer fund of knowledge  Treatment Modalities: Medication Management, Group therapy, Case management,  1 to 1 session with clinician, Psychoeducation, Recreational therapy.   Physician Treatment Plan for Primary Diagnosis: <principal problem not specified> Long Term Goal(s): Improvement in symptoms so as ready for discharge Improvement in symptoms so as ready for discharge   Short Term Goals: Ability to demonstrate self-control will improve Compliance with prescribed medications will improve  Medication Management: Evaluate patient's response, side effects, and tolerance of medication regimen.  Therapeutic Interventions: 1 to 1 sessions, Unit Group sessions and Medication administration.  Evaluation of Outcomes: Progressing  Physician Treatment Plan for Secondary Diagnosis: Active Problems:   MDD (major depressive disorder), recurrent episode, severe (HCC)  Long Term Goal(s): Improvement in symptoms so as ready for discharge Improvement in symptoms so as ready for discharge   Short Term Goals: Ability to demonstrate self-control will improve Compliance with prescribed medications will improve     Medication Management: Evaluate patient's response, side effects, and tolerance of medication regimen.  Therapeutic Interventions: 1 to 1 sessions, Unit Group sessions and Medication administration.  Evaluation of Outcomes:  Progressing   RN Treatment Plan for Primary Diagnosis: <principal problem not specified> Long Term Goal(s): Knowledge of disease and therapeutic regimen to maintain health will improve  Short Term Goals: Ability to demonstrate self-control, Ability to identify and develop effective coping behaviors will improve and Compliance with prescribed medications will improve  Medication Management: RN will administer medications as ordered by provider, will assess and evaluate patient's response and provide education to patient for prescribed medication. RN will report any adverse and/or side effects to prescribing provider.  Therapeutic Interventions: 1 on 1 counseling sessions, Psychoeducation, Medication administration, Evaluate responses to treatment, Monitor vital signs and CBGs as ordered, Perform/monitor CIWA, COWS, AIMS and Fall Risk screenings as ordered, Perform wound care treatments as ordered.  Evaluation of Outcomes: Progressing   LCSW Treatment Plan for Primary Diagnosis: <principal problem not specified> Long Term Goal(s): Safe transition to appropriate  next level of care at discharge, Engage patient in therapeutic group addressing interpersonal concerns.  Short Term Goals: Engage patient in aftercare planning with referrals and resources, Increase social support, Increase emotional regulation, Identify triggers associated with mental health/substance abuse issues and Increase skills for wellness and recovery  Therapeutic Interventions: Assess for all discharge needs, 1 to 1 time with Social worker, Explore available resources and support systems, Assess for adequacy in community support network, Educate family and significant other(s) on suicide prevention, Complete Psychosocial Assessment, Interpersonal group therapy.  Evaluation of Outcomes: Progressing   Progress in Treatment: Attending groups: No. Participating in groups: No. Taking medication as prescribed: Yes. Toleration  medication: Yes. Family/Significant other contact made: Yes, individual(s) contacted:  daughter Patient understands diagnosis: Yes. Discussing patient identified problems/goals with staff: Yes. Medical problems stabilized or resolved: Yes. Denies suicidal/homicidal ideation: Yes. Issues/concerns per patient self-inventory: No.  New problem(s) identified: No, Describe:  none   New Short Term/Long Term Goal(s): detox, medication management for mood stabilization; elimination of SI thoughts; development of comprehensive mental wellness/sobriety plan.  Patient Goals:  "I want to stop hurting myself. I want to start living my life" Wants residential treatment.  Discharge Plan or Barriers: Has a [facility] residential screening for 05/05. Otherwise, she may return home with family and [facility]  Reason for Continuation of Hospitalization: Anxiety Depression Withdrawal symptoms  Estimated Length of Stay: 5 days  Attendees: Patient: [name] 06/09/2018 10:40 AM  Physician:  06/09/2018 10:40 AM  Nursing:  06/09/2018 10:40 AM  RN Care Manager: 06/09/2018 10:40 AM  Social Worker:  Enid Cutterharlotte Raeli Wiens, LCSWA 06/09/2018 10:40 AM  Recreational Therapist:  06/09/2018 10:40 AM  Other:  06/09/2018 10:40 AM  Other:  06/09/2018 10:40 AM  Other: 06/09/2018 10:40 AM    Scribe for Treatment Team: Darreld Mcleanharlotte C Areeba Sulser, LCSWA 06/09/2018 10:40 AM

## 2018-06-09 NOTE — Progress Notes (Signed)
   06/09/18 0333  COVID-19 Daily Checkoff  Have you had a fever (temp > 37.80C/100F)  in the past 24 hours?  No  COVID-19 EXPOSURE  Have you traveled outside the state in the past 14 days? No  Have you been in contact with someone with a confirmed diagnosis of COVID-19 or PUI in the past 14 days without wearing appropriate PPE? No  Have you been living in the same home as a person with confirmed diagnosis of COVID-19 or a PUI (household contact)? No  Have you been diagnosed with COVID-19? No

## 2018-06-09 NOTE — [facility] Group Notes (Signed)
LCSW Group Therapy Note  06/09/2018 2:59 PM  Type of Therapy/Topic: Group Therapy: Feelings about Diagnosis  Participation Level: Minimal   Description of Group:  This group will allow patients to explore their thoughts and feelings about diagnoses they have received. Patients will be guided to explore their level of understanding and acceptance of these diagnoses. Facilitator will encourage patients to process their thoughts and feelings about the reactions of others to their diagnosis and will guide patients in identifying ways to discuss their diagnosis with significant others in their lives. This group will be process-oriented, with patients participating in exploration of their own experiences, giving and receiving support, and processing challenge from other group members.  Therapeutic Goals: 1. Patient will demonstrate understanding of diagnosis as evidenced by identifying two or more symptoms of the disorder 2. Patient will be able to express two feelings regarding the diagnosis 3. Patient will demonstrate their ability to communicate their needs through discussion and/or role play  Summary of Patient Progress: Patient remained present throughout group and listened respectfully to others, but she did not contribute to the discussion.  Therapeutic Modalities:  Cognitive Behavioral Therapy Brief Therapy Feelings Identification   Enid Cutter, MSW, [facility] Clinical Social Worker

## 2018-06-09 NOTE — Progress Notes (Signed)
Patient is currently asleep and has been asleep since shift change. No distress noted. Will continue to monitor. Safety maintained on unit with 15 min checks.

## 2018-06-10 DIAGNOSIS — F1024 Alcohol dependence with alcohol-induced mood disorder: Secondary | ICD-10-CM

## 2018-06-10 DIAGNOSIS — F3181 Bipolar II disorder: Secondary | ICD-10-CM

## 2018-06-10 MED ORDER — ZIPRASIDONE HCL 20 MG PO CAPS
20.0000 mg | ORAL_CAPSULE | ORAL | Status: DC
  Administered 2018-06-11 – 2018-06-13 (×3): 20 mg via ORAL
  Filled 2018-06-10 (×5): qty 1

## 2018-06-10 MED ORDER — ESCITALOPRAM OXALATE 10 MG PO TABS
10.0000 mg | ORAL_TABLET | Freq: Every day | ORAL | Status: DC
  Administered 2018-06-11 – 2018-06-13 (×3): 10 mg via ORAL
  Filled 2018-06-10 (×3): qty 1
  Filled 2018-06-10: qty 14
  Filled 2018-06-10: qty 1
  Filled 2018-06-10: qty 14

## 2018-06-10 MED ORDER — ZIPRASIDONE HCL 40 MG PO CAPS
40.0000 mg | ORAL_CAPSULE | Freq: Every day | ORAL | Status: DC
  Administered 2018-06-10 – 2018-06-12 (×3): 40 mg via ORAL
  Filled 2018-06-10 (×4): qty 1

## 2018-06-10 MED ORDER — TRAZODONE HCL 50 MG PO TABS
50.0000 mg | ORAL_TABLET | Freq: Every evening | ORAL | Status: DC | PRN
  Filled 2018-06-10: qty 1

## 2018-06-10 MED ORDER — BUSPIRONE HCL 10 MG PO TABS
10.0000 mg | ORAL_TABLET | Freq: Two times a day (BID) | ORAL | Status: DC
  Administered 2018-06-10 – 2018-06-13 (×6): 10 mg via ORAL
  Filled 2018-06-10: qty 28
  Filled 2018-06-10: qty 1
  Filled 2018-06-10 (×2): qty 28
  Filled 2018-06-10 (×2): qty 1
  Filled 2018-06-10: qty 28
  Filled 2018-06-10 (×2): qty 1
  Filled 2018-06-10: qty 2
  Filled 2018-06-10: qty 1

## 2018-06-10 NOTE — Progress Notes (Signed)
D Pt got quite paranoid, suspicious and agitated approx 45 min after taking 0800 meds. Writer deescalated pt, sat with pt and helped her calmed down and processed with pt her chemical imbalalance. OPt agitated, guarded , looking over her shoulder, crying and worried " somebody's going to hurt my children".      A Pt spoke with Dr Jama Flavors and meds adjusted. Pt reports prn Librium she received helped her anxiety " a lot". Her meds are adjusted and these changes discussed several times with her by this Clinical research associate. Pt answered questions on her daily assessment as follows: she deneid active SI and she rated her depression, hopelessness and anxeity " 5/5/5", respectively.     R Safety in place.

## 2018-06-10 NOTE — Progress Notes (Signed)
Patient came to nursing station after her blood pressure was taken. She reported that she needed her blood pressure medicine because her blood pressure was elevated. Writer had not had a chance to see what she had available but informed her that next medication pass is at 8 am. Writer asked what her blood pressure typically runs and she replied in the 200's. I made the statement about drinking alcohol and not having it in her system will cause her bp to increase. Writer spoke with NP and he was informed of her diastolic dropping in the 80's once she stood up. He informed Clinical research associate to not give clonidine since she had bp medications at 8 am. Patient asked writers name and wanted to know my supervisor. Writer gave her this information and AC was at an emergency on [address] and did not get a chance to speak with her.

## 2018-06-10 NOTE — Plan of Care (Signed)
  Problem: Education: Goal: Ability to make informed decisions regarding treatment will improve Outcome: Not Progressing   Problem: Coping: Goal: Coping ability will improve Outcome: Not Progressing   

## 2018-06-10 NOTE — Progress Notes (Signed)
[facility] Group Notes:  (Nursing/MHT/Case Management/Adjunct)  Date:  06/10/2018  Time:  2030  Type of Therapy:  wrap up group  Participation Level:  Active  Participation Quality:  Appropriate, Attentive, Sharing and Supportive  Affect:  Appropriate and Excited  Cognitive:  Alert  Insight:  Improving  Engagement in Group:  Engaged  Modes of Intervention:  Clarification, Education and Support  Summary of Progress/Problems:  [name] 06/10/2018, 9:37 PM
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
## 2018-06-10 NOTE — [facility] Group Notes (Signed)
LCSW Group Therapy Note  06/10/2018   10:00-11:00am   Type of Therapy and Topic:  Group Therapy: Anger Cues and Responses  Participation Level:  Did Not Attend   Description of Group:   In this group, patients learned how to recognize the physical, cognitive, emotional, and behavioral responses they have to anger-provoking situations.  They identified a recent time they became angry and how they reacted.  They analyzed how their reaction was possibly beneficial and how it was possibly unhelpful.  The group discussed a variety of healthier coping skills that could help with such a situation in the future.  Deep breathing was practiced briefly.  Therapeutic Goals: 1. Patients will remember their last incident of anger and how they felt emotionally and physically, what their thoughts were at the time, and how they behaved. 2. Patients will identify how their behavior at that time worked for them, as well as how it worked against them. 3. Patients will explore possible new behaviors to use in future anger situations. 4. Patients will learn that anger itself is normal and cannot be eliminated, and that healthier reactions can assist with resolving conflict rather than worsening situations.  Summary of Patient Progress:  N/A  Therapeutic Modalities:   Cognitive Behavioral Therapy  Ernest Popowski J Grossman-Orr    

## 2018-06-10 NOTE — [facility] Group Notes (Signed)
[facility] Group Notes:  (Nursing)  Date:  06/10/2018  Time:  100 PM Type of Therapy:  Nurse Education  Participation Level:  Active  Participation Quality:  Attentive  Affect:  Appropriate  Cognitive:  Appropriate  Insight:  Appropriate  Engagement in Group:  Engaged  Modes of Intervention:  Education  Summary of Progress/Problems:  Life Skills Group  Shela Nevin 06/10/2018, 7:57 PM

## 2018-06-10 NOTE — Progress Notes (Signed)
Writer spoke with patient around 0400 when she woke up. She was requesting Ativan which she did not have ordered. Writer encouraged her to speak with the doctor concerning if she needed this medication. She returned to her room to rest.

## 2018-06-11 LAB — LIPID PANEL
Cholesterol: 172 mg/dL (ref 0–200)
HDL: 46 mg/dL (ref >40)
LDL Cholesterol: 78 mg/dL (ref 0–99)
Total CHOL/HDL Ratio: 3.7 RATIO
Triglycerides: 238 mg/dL — ABNORMAL HIGH (ref <150)
VLDL: 48 mg/dL — ABNORMAL HIGH (ref 0–40)

## 2018-06-11 LAB — TSH: TSH: 0.653 u[IU]/mL (ref 0.350–4.500)

## 2018-06-11 LAB — HEMOGLOBIN A1C
Hgb A1c MFr Bld: 5.5 % (ref 4.8–5.6)
Mean Plasma Glucose: 111.15 mg/dL

## 2018-06-11 MED ORDER — TRAZODONE HCL 50 MG PO TABS
50.0000 mg | ORAL_TABLET | Freq: Once | ORAL | Status: AC
  Administered 2018-06-11: 50 mg via ORAL
  Filled 2018-06-11 (×2): qty 1

## 2018-06-11 NOTE — Progress Notes (Signed)
[place] NOVEL CORONAVIRUS (COVID-19) DAILY CHECK-OFF SYMPTOMS - answer yes or no to each - every day NO YES  Have you had a fever in the past 24 hours?  . Fever (Temp > 37.80C / 100F) X   Have you had any of these symptoms in the past 24 hours? . New Cough .  Sore Throat  .  Shortness of Breath .  Difficulty Breathing .  Unexplained Body Aches   X   Have you had any one of these symptoms in the past 24 hours not related to allergies?   . Runny Nose .  Nasal Congestion .  Sneezing   X   If you have had runny nose, nasal congestion, sneezing in the past 24 hours, has it worsened?  X   EXPOSURES - check yes or no X   Have you traveled outside the state in the past 14 days?  X   Have you been in contact with someone with a confirmed diagnosis of COVID-19 or PUI in the past 14 days without wearing appropriate PPE?  X   Have you been living in the same home as a person with confirmed diagnosis of COVID-19 or a PUI (household contact)?    X   Have you been diagnosed with COVID-19?    X              What to do next: Answered NO to all: Answered YES to anything:   Proceed with unit schedule Follow the [facility] Inpatient Flowsheet.   

## 2018-06-11 NOTE — Progress Notes (Signed)
Patient complains of feeling dizzy, stating "I think my Losartan dose is too much". Pt's B/P 146/80, HR 68 - sitting. Pt provided with pitcher of Gatorade- pt walked back to her room to lay down and rest, and instructed to ask for assistance before getting out of bed if she continues to feel dizzy.

## 2018-06-11 NOTE — Progress Notes (Signed)
Writer has observed patient in her room resting at beginning of shift. She did attend group tonight. Writer informed her of her hs medications. She had questions about her medications and what they were used for which writer explained to her. She requested her medications at 2100 because she wanted to rest. She was complaint with er medications. Safety maintained on unit with 15 min checks.

## 2018-06-11 NOTE — Progress Notes (Addendum)
D. Pt isolative to room for much of the morning- sad affect/depressed mood, but brightens during interactions.Per pt's self inventory this am, pt rated her depression, hopelessness and anxiety a 0/0/2, respectively. Observed in the milieu this afternoon interacting appropriately with peers and staff Pt currently denies SI/HI and AVH  A. Labs and vitals monitored. Pt compliant with medications. Pt supported emotionally and encouraged to express concerns and ask questions.   R. Pt remains safe with 15 minute checks. Will continue POC.

## 2018-06-11 NOTE — [facility] Group Notes (Signed)
MHT completed a morning check in. Pt goal for today is to think positive and go outside.

## 2018-06-11 NOTE — [facility] Group Notes (Signed)
[facility] Group Notes: (Clinical Social Work)   06/11/2018      Type of Therapy:  Group Therapy   Participation Level:  Did Not Attend - was invited both individually by MHT and by overhead announcement   Billyjack Trompeter Grossman-Orr, LCSW 06/11/2018, 10:51 AM     
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
## 2018-06-12 MED ORDER — TRAZODONE HCL 50 MG PO TABS
50.0000 mg | ORAL_TABLET | Freq: Every day | ORAL | Status: DC
  Administered 2018-06-12: 50 mg via ORAL
  Filled 2018-06-12 (×2): qty 1

## 2018-06-12 MED ORDER — ESCITALOPRAM OXALATE 20 MG PO TABS
20.0000 mg | ORAL_TABLET | Freq: Every day | ORAL | Status: DC
  Filled 2018-06-12: qty 14

## 2018-06-12 MED ORDER — ESCITALOPRAM OXALATE 20 MG PO TABS: 20.0000 mg | ORAL_TABLET | Freq: Every day | ORAL | 0 refills | Status: DC

## 2018-06-12 MED ORDER — CARVEDILOL 25 MG PO TABS
25.0000 mg | ORAL_TABLET | Freq: Two times a day (BID) | ORAL | Status: DC
  Filled 2018-06-12 (×2): qty 28

## 2018-06-12 MED ORDER — ATORVASTATIN CALCIUM 40 MG PO TABS
40.0000 mg | ORAL_TABLET | Freq: Every day | ORAL | Status: DC
  Filled 2018-06-12: qty 14

## 2018-06-12 MED ORDER — GABAPENTIN 300 MG PO CAPS: 300.0000 mg | ORAL_CAPSULE | Freq: Three times a day (TID) | ORAL | 1 refills | Status: DC

## 2018-06-12 MED ORDER — ASPIRIN EC 81 MG PO TBEC
81.0000 mg | DELAYED_RELEASE_TABLET | Freq: Every day | ORAL | Status: DC
  Filled 2018-06-12 (×2): qty 14

## 2018-06-12 MED ORDER — LOSARTAN POTASSIUM 50 MG PO TABS
50.0000 mg | ORAL_TABLET | Freq: Every day | ORAL | Status: DC
  Administered 2018-06-13: 50 mg via ORAL
  Filled 2018-06-12: qty 14
  Filled 2018-06-12: qty 1
  Filled 2018-06-12: qty 14

## 2018-06-12 MED ORDER — LOSARTAN POTASSIUM 50 MG PO TABS: 50.0000 mg | ORAL_TABLET | Freq: Every day | ORAL | 1 refills | Status: DC

## 2018-06-12 MED ORDER — PANTOPRAZOLE SODIUM 40 MG PO TBEC
40.0000 mg | DELAYED_RELEASE_TABLET | Freq: Every day | ORAL | Status: DC
  Filled 2018-06-12 (×2): qty 14

## 2018-06-12 MED ORDER — BUSPIRONE HCL 10 MG PO TABS: 10.0000 mg | ORAL_TABLET | Freq: Two times a day (BID) | ORAL | 2 refills | Status: DC

## 2018-06-12 NOTE — [facility] Suicide Risk Assessment (Signed)
[facility] Discharge Suicide Risk Assessment   Principal Problem: Alcoholism and depressive disorder Discharge Diagnoses: Active Problems:   MDD (major depressive disorder), recurrent episode, severe ([facility])   Total Time spent with patient: 45 minutes Currently is alert oriented affect constricted no thoughts of harming self or others no suicidal plans or intent ADL's:  Intact   Mental Status Per Nursing Assessment::   On Admission:  Suicidal ideation indicated by patient  Demographic Factors:  Unemployed  Loss Factors: Decrease in vocational status  Historical Factors: NA  Risk Reduction Factors:   Sense of responsibility to family and Religious beliefs about death  Continued Clinical Symptoms:  Alcohol/Substance Abuse/Dependencies  Cognitive Features That Contribute To Risk:  None    Suicide Risk:  Minimal: No identifiable suicidal ideation.  Patients presenting with no risk factors but with morbid ruminations; may be classified as minimal risk based on the severity of the depressive symptoms  Follow-up Information    [facility] Follow up on 06/15/2018.   Why:  Hospital follow up appointment is Thursday, 5/7 at 11:00a.  The appointment will be over the phone. The provider will contact you.  Contact information: [address] [place] [address] [phone]        [facility], [facility] Follow up on 06/13/2018.   Why:  Please attend your screening for possible admission on Tuesday, 5/5 at 7:45a.  Be sure to bring your photo ID, proof of residency, 30-day supply of medications and 2 weeks of clothing.  Contact information: [address] [place] [address] [phone]           Plan Of Care/Follow-up recommendations:  Activity:  full  Jacee Enerson, MD 06/12/2018, 3:02 PM

## 2018-06-12 NOTE — Progress Notes (Signed)
[facility] NOVEL CORONAVIRUS (COVID-19) DAILY CHECK-OFF SYMPTOMS - answer yes or no to each - every day NO YES  Have you had a fever in the past 24 hours?  . Fever (Temp > 37.80C / 100F) X   Have you had any of these symptoms in the past 24 hours? . New Cough .  Sore Throat  .  Shortness of Breath .  Difficulty Breathing .  Unexplained Body Aches   X   Have you had any one of these symptoms in the past 24 hours not related to allergies?   . Runny Nose .  Nasal Congestion .  Sneezing   X   If you have had runny nose, nasal congestion, sneezing in the past 24 hours, has it worsened?  X   EXPOSURES - check yes or no X   Have you traveled outside the state in the past 14 days?  X   Have you been in contact with someone with a confirmed diagnosis of COVID-19 or PUI in the past 14 days without wearing appropriate PPE?  X   Have you been living in the same home as a person with confirmed diagnosis of COVID-19 or a PUI (household contact)?    X   Have you been diagnosed with COVID-19?    X              What to do next: Answered NO to all: Answered YES to anything:   Proceed with unit schedule Follow the [facility] Inpatient Flowsheet.   

## 2018-06-12 NOTE — Progress Notes (Signed)
Pt attended spiritual care group on grief and loss facilitated by chaplain Burnis Kingfisher   Group opened with brief discussion and psycho-social ed around grief and loss in relationships and in relation to self.  Group members engaged in facilitated dialog and support  - identifying life patterns, circumstances, changes that cause losses.  Group goal of establishing open and affirming space for members to share loss and experience with grief, normalize grief experience and provide psycho social education and grief support.  Tanae was invited to group.  Did not attend.

## 2018-06-12 NOTE — Progress Notes (Signed)
  [facility] Adult Case Management Discharge Plan :  Will you be returning to the same living situation after discharge:  No. Going to residential treatment. At discharge, do you have transportation home?: Yes,  taxi  Do you have the ability to pay for your medications: No. Provided medication samples.  Release of information consent forms completed and in the chart; taxi voucher on chart. Patient needs to take a taxi to [facility] at 7:00am on 06/13/18 to arrive for her 7:45am screening.  Patient to Follow up at: Follow-up Information    [facility] Follow up on 06/15/2018.   Why:  Hospital follow up appointment is Thursday, 5/7 at 11:00a.  The appointment will be over the phone. The provider will contact you.  Contact information: [address] [place] [address] [phone]        [facility] Follow up on 06/13/2018.   Why:  Please attend your screening for possible admission on Tuesday, 5/5 at 7:45a.  Be sure to bring your photo ID, proof of residency, 30-day supply of medications and 2 weeks of clothing.  Contact information: [address] [place] [address] [phone]           Next level of care provider has access to [facility] Link:no  Safety Planning and Suicide Prevention discussed: Yes,  with daughter  Have you used any form of tobacco in the last 30 days? (Cigarettes, Smokeless Tobacco, Cigars, and/or Pipes): No  Has patient been referred to the Quitline?: N/A patient is not a smoker  Patient has been referred for addiction treatment: Yes  Darreld Mclean, LCSWA 06/12/2018, 3:08 PM

## 2018-06-12 NOTE — Progress Notes (Signed)
Discharge- Patient verbalizes readiness for discharge: Denies SI/HI, is not psychotic or delusional. A- Discharge instruction read and discussed with Pt. All belongings returned to patient.  R- Patient verbalize understanding of discharge instructions. Signed for return of belongings. Breakfast tray given. Escorted to the lobby and awaited arrival of taxi.   Destination: [facility]

## 2018-06-12 NOTE — [facility] Suicide Risk Assessment (Signed)
[facility] Discharge Suicide Risk Assessment   Principal Problem: Alcohol dependence/recurrent depression discharge Diagnoses: Active Problems:   MDD (major depressive disorder), recurrent episode, severe (HCC)   Total Time spent with patient: 45 minutes  Musculoskeletal: Strength & Muscle Tone: within normal limits Gait & Station: normal Patient leans: N/A  Psychiatric Specialty Exam: ROS  Blood pressure 94/65, pulse 65, temperature 97.9 F (36.6 C), temperature source Oral, resp. rate 16, height 5\' 1"  (1.549 m), weight 77.1 kg, SpO2 100 %.Body mass index is 32.12 kg/m.  General Appearance: Casual  Eye Contact::  Good  Speech:  Clear and Coherent409  Volume:  Decreased  Mood:  Euthymic  Affect:  Flat  Thought Process:  Coherent- no psychosis  Orientation:  Full (Time, Place, and Person)  Thought Content:  Logical and Rumination  Suicidal Thoughts:  No  Homicidal Thoughts:  No  Memory:  Immediate;   Good  Judgement:  Good  Insight:  Good  Psychomotor Activity:  Decreased  Concentration:  Good  Recall:  Good  Fund of Knowledge:Good  Language: Good  Akathisia:  Negative  Handed:  Right  AIMS (if indicated):     Assets:  Communication Skills Desire for Improvement Housing Leisure Time  Sleep:  Number of Hours: 6.75  Cognition: WNL  ADL's:  Intact   Mental Status Per Nursing Assessment::   On Admission:  Suicidal ideation indicated by patient  Demographic Factors:  Unemployed  Loss Factors: NA  Historical Factors: NA  Risk Reduction Factors:   NA  Continued Clinical Symptoms:  Depression:   Comorbid alcohol abuse/dependence  Cognitive Features That Contribute To Risk:  None    Suicide Risk:  Minimal: No identifiable suicidal ideation.  Patients presenting with no risk factors but with morbid ruminations; may be classified as minimal risk based on the severity of the depressive symptoms  Follow-up Information    [facility] Follow up on 06/15/2018.   Why:   Hospital follow up appointment is Thursday, 5/7 at 11:00a.  The appointment will be over the phone. The provider will contact you.  Contact information: [address] [place] [address] [phone]        Services, [facility] Follow up on 06/13/2018.   Why:  Please attend your screening for possible admission on Tuesday, 5/5 at 7:45a.  Be sure to bring your photo ID, proof of residency, 30-day supply of medications and 2 weeks of clothing.  Contact information: [address] [place] [address] [phone]           Plan Of Care/Follow-up recommendations:  Activity:  full  Raidyn Wassink, MD 06/12/2018, 7:47 AM

## 2018-06-12 NOTE — Progress Notes (Signed)
Recreation Therapy Notes  Date:  5.4.20 Time: 0930 Location: 300 Hall Dayroom  Group Topic: Stress Management  Goal Area(s) Addresses:  Patient will identify positive stress management techniques. Patient will identify benefits of using stress management post d/c.  Intervention: Stress Management  Activity :  Meditation.  LRT introduced the stress management technique of meditation.  The meditation focused on being resilient in the face of adversity.  Patients were to listen as meditation played in order to engage in activity.  Education:  Stress Management, Discharge Planning.   Education Outcome: Acknowledges Education  Clinical Observations/Feedback: Pt did not attend group.     Caroll Rancher, LRT/CTRS         Caroll Rancher A 06/12/2018 10:44 AM

## 2018-06-12 NOTE — Progress Notes (Signed)
Discharge- Patient verbalizes readiness for discharge: Denies SI/HI, is not psychotic or delusional. A- Discharge instructions read and discussed with patient. All belongings returned to patient.See belongings sheet.  R- Patient verbalize understanding of discharge instructions. Signed for return of belongings. Breakfast given.Escorted to the lobby.Taxi called. Awaited for arrival of taxi with Pt.   Destination: [facility]

## 2018-06-12 NOTE — Progress Notes (Signed)
Patient has a screening at [facility] for residential treatment tomorrow, 06/13/2018.  Patient will need a 28 day supply of medications.  Patient will need discharge orders in this evening.  Patient will need to take a taxi at 7:00am to [facility] in order to arrive in time for her 7:45am intake screening. CSW will leave a completed taxi voucher on patient's chart.  Enid Cutter, LCSW-A Clinical Social Worker

## 2018-06-12 NOTE — Progress Notes (Signed)
D: Patient observed resting in room most of the evening. Patient guarded, somewhat irritable, labile but relaxes during time spent 1:1.  Denies pain, physical complaints. COVID-19 screen negative, afebrile. Respiratory assessment WDL. Patient asking for something to help her sleep.  A: Medicated per orders, prn trazadone given to promote rest. Medication education provided. Level III obs in place for safety. Emotional support offered. Patient encouraged to complete Suicide Safety Plan before discharge. Encouraged to attend and participate in unit programming.  Fall prevention plan in place and reviewed with patient.   R: Patient verbalizes understanding of POC, falls prevention education. On reassess, patient is asleep. Patient denies SI/HI/AVH and remains safe on level III obs. Will continue to monitor throughout the night.
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
## 2018-06-12 NOTE — Progress Notes (Signed)
Adult Psychoeducational Group Note  Date:  06/12/2018 Time:  11:38 PM  Group Topic/Focus:  Wrap-Up Group:   The focus of this group is to help patients review their daily goal of treatment and discuss progress on daily workbooks.  Participation Level:  Active  Participation Quality:  Appropriate  Affect:  Appropriate  Cognitive:  Appropriate  Insight: Appropriate  Engagement in Group:  Engaged  Modes of Intervention:  Discussion  Additional Comments:  Patient attended wrap up group and said that her day was a 8. Patient said she spend her day resting in bed.   Breta Demedeiros W Cordie Beazley 06/12/2018, 11:38 PM

## 2018-06-12 NOTE — Progress Notes (Signed)
[name] attended wrap-up group. Pt was animated/silly in affect and mood. Pt denies SI/HI/AVH/Pain at this time. Pt states she is "exicted" for d/c to [facility] in a.m. Pt appears preoccupied with receiving her trazodone. Support offered. Will continue with POC.

## 2018-06-13 ENCOUNTER — Other Ambulatory Visit (HOSPITAL_COMMUNITY): Payer: Self-pay | Admitting: Psychiatry

## 2018-06-13 DIAGNOSIS — I10 Essential (primary) hypertension: Secondary | ICD-10-CM

## 2018-06-13 MED ORDER — ATORVASTATIN CALCIUM 40 MG PO TABS: 40.0000 mg | ORAL_TABLET | Freq: Every day | ORAL | 0 refills | Status: DC

## 2018-06-13 MED ORDER — TRIAMTERENE-HCTZ 37.5-25 MG PO TABS: 1.0000 | ORAL_TABLET | Freq: Every day | ORAL | 0 refills | Status: DC

## 2018-06-13 MED ORDER — ASPIRIN EC 81 MG PO TBEC: 81.0000 mg | DELAYED_RELEASE_TABLET | Freq: Every day | ORAL | 0 refills | Status: DC

## 2018-06-13 MED ORDER — CARVEDILOL 25 MG PO TABS: 25.0000 mg | ORAL_TABLET | Freq: Two times a day (BID) | ORAL | 0 refills | Status: DC

## 2018-06-13 MED ORDER — PANTOPRAZOLE SODIUM 40 MG PO TBEC: 40.0000 mg | DELAYED_RELEASE_TABLET | ORAL | 0 refills | Status: DC
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
## 2018-06-23 ENCOUNTER — Emergency Department (HOSPITAL_COMMUNITY)
Admission: EM | Admit: 2018-06-23 | Discharge: 2018-06-23 | Disposition: A | Discharge status: Home / Self Care | Payer: Self-pay | Attending: Emergency Medicine | Admitting: Emergency Medicine

## 2018-06-23 ENCOUNTER — Other Ambulatory Visit: Payer: Self-pay

## 2018-06-23 ENCOUNTER — Emergency Department (HOSPITAL_COMMUNITY): Payer: Self-pay

## 2018-06-23 ENCOUNTER — Encounter (HOSPITAL_COMMUNITY): Payer: Self-pay

## 2018-06-23 DIAGNOSIS — Z91040 Latex allergy status: Secondary | ICD-10-CM | POA: Insufficient documentation

## 2018-06-23 DIAGNOSIS — I10 Essential (primary) hypertension: Secondary | ICD-10-CM | POA: Insufficient documentation

## 2018-06-23 DIAGNOSIS — F319 Bipolar disorder, unspecified: Secondary | ICD-10-CM | POA: Insufficient documentation

## 2018-06-23 DIAGNOSIS — Z87891 Personal history of nicotine dependence: Secondary | ICD-10-CM | POA: Insufficient documentation

## 2018-06-23 DIAGNOSIS — R202 Paresthesia of skin: Secondary | ICD-10-CM | POA: Insufficient documentation

## 2018-06-23 DIAGNOSIS — Z79899 Other long term (current) drug therapy: Secondary | ICD-10-CM | POA: Insufficient documentation

## 2018-06-23 DIAGNOSIS — I252 Old myocardial infarction: Secondary | ICD-10-CM | POA: Insufficient documentation

## 2018-06-23 DIAGNOSIS — I2510 Atherosclerotic heart disease of native coronary artery without angina pectoris: Secondary | ICD-10-CM | POA: Insufficient documentation

## 2018-06-23 DIAGNOSIS — Z7982 Long term (current) use of aspirin: Secondary | ICD-10-CM | POA: Insufficient documentation

## 2018-06-23 LAB — CBC WITH DIFFERENTIAL/PLATELET
Abs Immature Granulocytes: 0.02 10*3/uL (ref 0.00–0.07)
Basophils Absolute: 0 10*3/uL (ref 0.0–0.1)
Basophils Relative: 1 %
Eosinophils Absolute: 0.1 10*3/uL (ref 0.0–0.5)
Eosinophils Relative: 2 %
HCT: 46.7 % — ABNORMAL HIGH (ref 36.0–46.0)
Hemoglobin: 14.8 g/dL (ref 12.0–15.0)
Immature Granulocytes: 0 %
Lymphocytes Relative: 26 %
Lymphs Abs: 1.4 10*3/uL (ref 0.7–4.0)
MCH: 28.1 pg (ref 26.0–34.0)
MCHC: 31.7 g/dL (ref 30.0–36.0)
MCV: 88.8 fL (ref 80.0–100.0)
Monocytes Absolute: 0.5 10*3/uL (ref 0.1–1.0)
Monocytes Relative: 9 %
Neutro Abs: 3.5 10*3/uL (ref 1.7–7.7)
Neutrophils Relative %: 62 %
Platelets: 361 10*3/uL (ref 150–400)
RBC: 5.26 MIL/uL — ABNORMAL HIGH (ref 3.87–5.11)
RDW: 12.2 % (ref 11.5–15.5)
WBC: 5.6 10*3/uL (ref 4.0–10.5)
nRBC: 0 % (ref 0.0–0.2)

## 2018-06-23 LAB — BASIC METABOLIC PANEL
Anion gap: 9 (ref 5–15)
BUN: 10 mg/dL (ref 6–20)
CO2: 24 mmol/L (ref 22–32)
Calcium: 9.4 mg/dL (ref 8.9–10.3)
Chloride: 104 mmol/L (ref 98–111)
Creatinine, Ser: 0.82 mg/dL (ref 0.44–1.00)
GFR calc Af Amer: >60 mL/min (ref >60)
GFR calc non Af Amer: >60 mL/min (ref >60)
Glucose, Bld: 92 mg/dL (ref 70–99)
Potassium: 3.9 mmol/L (ref 3.5–5.1)
Sodium: 137 mmol/L (ref 135–145)

## 2018-06-23 LAB — ETHANOL: Alcohol, Ethyl (B): <10 mg/dL (ref <10)

## 2018-06-23 LAB — HEPATIC FUNCTION PANEL
ALT: 22 U/L (ref 0–44)
AST: 20 U/L (ref 15–41)
Albumin: 3.8 g/dL (ref 3.5–5.0)
Alkaline Phosphatase: 88 U/L (ref 38–126)
Bilirubin, Direct: <0.1 mg/dL (ref 0.0–0.2)
Total Bilirubin: 0.6 mg/dL (ref 0.3–1.2)
Total Protein: 7.1 g/dL (ref 6.5–8.1)

## 2018-06-23 MED ORDER — SODIUM CHLORIDE 0.9 % IV BOLUS
1000.0000 mL | Freq: Once | INTRAVENOUS | Status: AC
  Administered 2018-06-23: 1000 mL via INTRAVENOUS

## 2018-06-23 NOTE — ED Triage Notes (Signed)
Pt from [facility] [address] for numbness to left face, left middle 2 fingers that began at 0800 this morning. Has hx of MI, has been off of blood thinner x 1 week since being in facility. No weakness, facial droop, speech change or visual changes. Pt living at [facility] [address] for alcohol abuse.

## 2018-06-23 NOTE — ED Notes (Addendum)
Pt verbalized understanding of d/c instructions and has no further questions, VSS, NAD. Pt has no neuro sx at this time. Numbness is gone. Pt will go back to [facility]. [facility] called by this RN and notified. Pt has been talking to her family the entire time she has been here and they are up to date.

## 2018-06-23 NOTE — ED Notes (Signed)
Patient transported to CT 

## 2018-06-23 NOTE — ED Notes (Signed)
[facility] cancelled for transport. Pt's sister is coming to get her to take her back to [facility].
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
## 2018-06-23 NOTE — Discharge Instructions (Addendum)
Follow-up with your primary care provider. Continue your home medications as previously prescribed. Return for worsening symptoms, headache with vision changes, numbness in arms or legs, vomiting or coughing up blood.

## 2018-06-23 NOTE — ED Notes (Signed)
Pt  States that numbness in left face and 2 middle fingers on left hand has improved

## 2018-09-14 ENCOUNTER — Other Ambulatory Visit: Payer: Self-pay | Admitting: *Deleted

## 2018-09-14 DIAGNOSIS — Z1231 Encounter for screening mammogram for malignant neoplasm of breast: Secondary | ICD-10-CM

## 2018-09-18 ENCOUNTER — Inpatient Hospital Stay: Admission: RE | Admit: 2018-09-18 | Payer: Self-pay | Source: Ambulatory Visit

## 2019-01-11 ENCOUNTER — Encounter (HOSPITAL_COMMUNITY): Payer: Self-pay | Admitting: Emergency Medicine

## 2019-01-11 ENCOUNTER — Inpatient Hospital Stay (HOSPITAL_COMMUNITY)
Admission: EM | Admit: 2019-01-11 | Discharge: 2019-01-25 | DRG: 234 | Disposition: A | Discharge status: Home / Self Care | Payer: Self-pay | Attending: Thoracic Surgery (Cardiothoracic Vascular Surgery) | Admitting: Thoracic Surgery (Cardiothoracic Vascular Surgery)

## 2019-01-11 ENCOUNTER — Other Ambulatory Visit: Payer: Self-pay

## 2019-01-11 ENCOUNTER — Emergency Department (HOSPITAL_COMMUNITY): Payer: Self-pay

## 2019-01-11 DIAGNOSIS — I25110 Atherosclerotic heart disease of native coronary artery with unstable angina pectoris: Secondary | ICD-10-CM | POA: Diagnosis present

## 2019-01-11 DIAGNOSIS — Z91040 Latex allergy status: Secondary | ICD-10-CM

## 2019-01-11 DIAGNOSIS — I200 Unstable angina: Secondary | ICD-10-CM

## 2019-01-11 DIAGNOSIS — Z801 Family history of malignant neoplasm of trachea, bronchus and lung: Secondary | ICD-10-CM

## 2019-01-11 DIAGNOSIS — Z20828 Contact with and (suspected) exposure to other viral communicable diseases: Secondary | ICD-10-CM | POA: Diagnosis present

## 2019-01-11 DIAGNOSIS — E669 Obesity, unspecified: Secondary | ICD-10-CM | POA: Diagnosis present

## 2019-01-11 DIAGNOSIS — Z7982 Long term (current) use of aspirin: Secondary | ICD-10-CM

## 2019-01-11 DIAGNOSIS — I10 Essential (primary) hypertension: Secondary | ICD-10-CM | POA: Diagnosis present

## 2019-01-11 DIAGNOSIS — Z955 Presence of coronary angioplasty implant and graft: Secondary | ICD-10-CM

## 2019-01-11 DIAGNOSIS — N390 Urinary tract infection, site not specified: Secondary | ICD-10-CM | POA: Diagnosis present

## 2019-01-11 DIAGNOSIS — F319 Bipolar disorder, unspecified: Secondary | ICD-10-CM | POA: Diagnosis present

## 2019-01-11 DIAGNOSIS — F419 Anxiety disorder, unspecified: Secondary | ICD-10-CM | POA: Diagnosis present

## 2019-01-11 DIAGNOSIS — I252 Old myocardial infarction: Secondary | ICD-10-CM

## 2019-01-11 DIAGNOSIS — I214 Non-ST elevation (NSTEMI) myocardial infarction: Principal | ICD-10-CM | POA: Diagnosis present

## 2019-01-11 DIAGNOSIS — Z87820 Personal history of traumatic brain injury: Secondary | ICD-10-CM

## 2019-01-11 DIAGNOSIS — E782 Mixed hyperlipidemia: Secondary | ICD-10-CM | POA: Diagnosis present

## 2019-01-11 DIAGNOSIS — Z87442 Personal history of urinary calculi: Secondary | ICD-10-CM

## 2019-01-11 DIAGNOSIS — J939 Pneumothorax, unspecified: Secondary | ICD-10-CM

## 2019-01-11 DIAGNOSIS — F1020 Alcohol dependence, uncomplicated: Secondary | ICD-10-CM | POA: Diagnosis present

## 2019-01-11 DIAGNOSIS — J9811 Atelectasis: Secondary | ICD-10-CM | POA: Diagnosis not present

## 2019-01-11 DIAGNOSIS — I110 Hypertensive heart disease with heart failure: Secondary | ICD-10-CM | POA: Diagnosis present

## 2019-01-11 DIAGNOSIS — K219 Gastro-esophageal reflux disease without esophagitis: Secondary | ICD-10-CM | POA: Diagnosis present

## 2019-01-11 DIAGNOSIS — D62 Acute posthemorrhagic anemia: Secondary | ICD-10-CM | POA: Diagnosis not present

## 2019-01-11 DIAGNOSIS — Z951 Presence of aortocoronary bypass graft: Secondary | ICD-10-CM

## 2019-01-11 DIAGNOSIS — Z818 Family history of other mental and behavioral disorders: Secondary | ICD-10-CM

## 2019-01-11 DIAGNOSIS — Z8674 Personal history of sudden cardiac arrest: Secondary | ICD-10-CM

## 2019-01-11 DIAGNOSIS — Z79899 Other long term (current) drug therapy: Secondary | ICD-10-CM

## 2019-01-11 DIAGNOSIS — Z9119 Patient's noncompliance with other medical treatment and regimen: Secondary | ICD-10-CM

## 2019-01-11 DIAGNOSIS — E785 Hyperlipidemia, unspecified: Secondary | ICD-10-CM | POA: Diagnosis present

## 2019-01-11 DIAGNOSIS — I249 Acute ischemic heart disease, unspecified: Secondary | ICD-10-CM | POA: Diagnosis present

## 2019-01-11 DIAGNOSIS — G4733 Obstructive sleep apnea (adult) (pediatric): Secondary | ICD-10-CM | POA: Diagnosis present

## 2019-01-11 DIAGNOSIS — Z7141 Alcohol abuse counseling and surveillance of alcoholic: Secondary | ICD-10-CM

## 2019-01-11 DIAGNOSIS — Z713 Dietary counseling and surveillance: Secondary | ICD-10-CM

## 2019-01-11 DIAGNOSIS — Z716 Tobacco abuse counseling: Secondary | ICD-10-CM

## 2019-01-11 DIAGNOSIS — Z885 Allergy status to narcotic agent status: Secondary | ICD-10-CM

## 2019-01-11 DIAGNOSIS — Z9689 Presence of other specified functional implants: Secondary | ICD-10-CM

## 2019-01-11 DIAGNOSIS — F17210 Nicotine dependence, cigarettes, uncomplicated: Secondary | ICD-10-CM | POA: Diagnosis present

## 2019-01-11 DIAGNOSIS — Z720 Tobacco use: Secondary | ICD-10-CM | POA: Diagnosis present

## 2019-01-11 DIAGNOSIS — I2510 Atherosclerotic heart disease of native coronary artery without angina pectoris: Secondary | ICD-10-CM | POA: Diagnosis present

## 2019-01-11 DIAGNOSIS — Z981 Arthrodesis status: Secondary | ICD-10-CM

## 2019-01-11 DIAGNOSIS — F1010 Alcohol abuse, uncomplicated: Secondary | ICD-10-CM | POA: Diagnosis present

## 2019-01-11 DIAGNOSIS — Z6835 Body mass index (BMI) 35.0-35.9, adult: Secondary | ICD-10-CM

## 2019-01-11 DIAGNOSIS — J90 Pleural effusion, not elsewhere classified: Secondary | ICD-10-CM

## 2019-01-11 DIAGNOSIS — Z8249 Family history of ischemic heart disease and other diseases of the circulatory system: Secondary | ICD-10-CM

## 2019-01-11 DIAGNOSIS — I160 Hypertensive urgency: Secondary | ICD-10-CM | POA: Diagnosis present

## 2019-01-11 DIAGNOSIS — I5032 Chronic diastolic (congestive) heart failure: Secondary | ICD-10-CM | POA: Diagnosis present

## 2019-01-11 LAB — PROTIME-INR
INR: 1 (ref 0.8–1.2)
Prothrombin Time: 13.2 seconds (ref 11.4–15.2)

## 2019-01-11 LAB — CBC
HCT: 46.4 % — ABNORMAL HIGH (ref 36.0–46.0)
Hemoglobin: 14.7 g/dL (ref 12.0–15.0)
MCH: 28.2 pg (ref 26.0–34.0)
MCHC: 31.7 g/dL (ref 30.0–36.0)
MCV: 88.9 fL (ref 80.0–100.0)
Platelets: 322 10*3/uL (ref 150–400)
RBC: 5.22 MIL/uL — ABNORMAL HIGH (ref 3.87–5.11)
RDW: 12.5 % (ref 11.5–15.5)
WBC: 7.1 10*3/uL (ref 4.0–10.5)
nRBC: 0 % (ref 0.0–0.2)

## 2019-01-11 LAB — I-STAT BETA HCG BLOOD, ED (MC, WL, AP ONLY): I-stat hCG, quantitative: 5.8 m[IU]/mL — ABNORMAL HIGH (ref <5)

## 2019-01-11 MED ORDER — SODIUM CHLORIDE 0.9% FLUSH: 3.0000 mL | Freq: Once | INTRAVENOUS | Status: DC

## 2019-01-11 NOTE — ED Triage Notes (Signed)
Patient reports central chest pain radiating to jaw and neck onset this evening with SOB and emesis x2 . History of CAD/Coronary stent , her cardiologist is Dr. Dema Severin .

## 2019-01-12 ENCOUNTER — Encounter (HOSPITAL_COMMUNITY): Payer: Self-pay | Admitting: Internal Medicine

## 2019-01-12 DIAGNOSIS — E669 Obesity, unspecified: Secondary | ICD-10-CM

## 2019-01-12 DIAGNOSIS — E66812 Obesity, class 2: Secondary | ICD-10-CM

## 2019-01-12 DIAGNOSIS — I200 Unstable angina: Secondary | ICD-10-CM

## 2019-01-12 DIAGNOSIS — I160 Hypertensive urgency: Secondary | ICD-10-CM

## 2019-01-12 DIAGNOSIS — I249 Acute ischemic heart disease, unspecified: Secondary | ICD-10-CM | POA: Diagnosis present

## 2019-01-12 HISTORY — DX: Obesity, unspecified: E66.9

## 2019-01-12 HISTORY — DX: Unstable angina: I20.0

## 2019-01-12 HISTORY — DX: Obesity, class 2: E66.812

## 2019-01-12 LAB — TROPONIN I (HIGH SENSITIVITY)
Troponin I (High Sensitivity): 12 ng/L (ref <18)
Troponin I (High Sensitivity): 32 ng/L — ABNORMAL HIGH (ref <18)
Troponin I (High Sensitivity): 191 ng/L (ref <18)
Troponin I (High Sensitivity): 515 ng/L (ref <18)
Troponin I (High Sensitivity): 572 ng/L (ref <18)

## 2019-01-12 LAB — BASIC METABOLIC PANEL
Anion gap: 12 (ref 5–15)
BUN: 10 mg/dL (ref 6–20)
CO2: 22 mmol/L (ref 22–32)
Calcium: 9.6 mg/dL (ref 8.9–10.3)
Chloride: 107 mmol/L (ref 98–111)
Creatinine, Ser: 0.75 mg/dL (ref 0.44–1.00)
GFR calc Af Amer: >60 mL/min (ref >60)
GFR calc non Af Amer: >60 mL/min (ref >60)
Glucose, Bld: 106 mg/dL — ABNORMAL HIGH (ref 70–99)
Potassium: 3.4 mmol/L — ABNORMAL LOW (ref 3.5–5.1)
Sodium: 141 mmol/L (ref 135–145)

## 2019-01-12 LAB — TSH: TSH: 1.375 u[IU]/mL (ref 0.350–4.500)

## 2019-01-12 LAB — SARS CORONAVIRUS 2 (TAT 6-24 HRS): SARS Coronavirus 2: NEGATIVE

## 2019-01-12 LAB — RAPID URINE DRUG SCREEN, HOSP PERFORMED
Amphetamines: NOT DETECTED
Barbiturates: NOT DETECTED
Benzodiazepines: NOT DETECTED
Cocaine: NOT DETECTED
Opiates: NOT DETECTED
Tetrahydrocannabinol: NOT DETECTED

## 2019-01-12 LAB — HEMOGLOBIN A1C
Hgb A1c MFr Bld: 6.1 % — ABNORMAL HIGH (ref 4.8–5.6)
Mean Plasma Glucose: 128.37 mg/dL

## 2019-01-12 LAB — HEPARIN LEVEL (UNFRACTIONATED): Heparin Unfractionated: 0.59 IU/mL (ref 0.30–0.70)

## 2019-01-12 MED ORDER — ACETAMINOPHEN 325 MG PO TABS
650.0000 mg | ORAL_TABLET | ORAL | Status: DC | PRN
  Administered 2019-01-16: 18:00:00 650 mg via ORAL
  Filled 2019-01-12 (×2): qty 2

## 2019-01-12 MED ORDER — VITAMIN B-1 100 MG PO TABS
100.0000 mg | ORAL_TABLET | Freq: Every day | ORAL | Status: DC
  Administered 2019-01-12 – 2019-01-25 (×13): 100 mg via ORAL
  Filled 2019-01-12 (×14): qty 1

## 2019-01-12 MED ORDER — CARVEDILOL 12.5 MG PO TABS
25.0000 mg | ORAL_TABLET | Freq: Two times a day (BID) | ORAL | Status: DC
  Administered 2019-01-12: 25 mg via ORAL
  Filled 2019-01-12: qty 2

## 2019-01-12 MED ORDER — ONDANSETRON HCL 4 MG/2ML IJ SOLN: 4.0000 mg | Freq: Four times a day (QID) | INTRAMUSCULAR | Status: DC | PRN

## 2019-01-12 MED ORDER — ALBUTEROL SULFATE (2.5 MG/3ML) 0.083% IN NEBU
3.0000 mL | INHALATION_SOLUTION | Freq: Four times a day (QID) | RESPIRATORY_TRACT | Status: DC | PRN
  Administered 2019-01-17: 20:00:00 3 mL via RESPIRATORY_TRACT
  Filled 2019-01-12: qty 3

## 2019-01-12 MED ORDER — ASPIRIN 81 MG PO CHEW
324.0000 mg | CHEWABLE_TABLET | Freq: Once | ORAL | Status: AC
  Administered 2019-01-12: 324 mg via ORAL
  Filled 2019-01-12: qty 4

## 2019-01-12 MED ORDER — ESCITALOPRAM OXALATE 20 MG PO TABS
20.0000 mg | ORAL_TABLET | Freq: Every day | ORAL | Status: DC
  Administered 2019-01-12 – 2019-01-25 (×13): 20 mg via ORAL
  Filled 2019-01-12 (×2): qty 2
  Filled 2019-01-12: qty 1
  Filled 2019-01-12: qty 2
  Filled 2019-01-12 (×2): qty 1
  Filled 2019-01-12: qty 2
  Filled 2019-01-12: qty 1
  Filled 2019-01-12 (×3): qty 2
  Filled 2019-01-12 (×2): qty 1

## 2019-01-12 MED ORDER — HEPARIN BOLUS VIA INFUSION
4000.0000 [IU] | Freq: Once | INTRAVENOUS | Status: AC
  Administered 2019-01-12: 4000 [IU] via INTRAVENOUS
  Filled 2019-01-12: qty 4000

## 2019-01-12 MED ORDER — HYDROCHLOROTHIAZIDE 25 MG PO TABS
25.0000 mg | ORAL_TABLET | Freq: Every day | ORAL | Status: DC
  Administered 2019-01-12 – 2019-01-17 (×6): 25 mg via ORAL
  Filled 2019-01-12 (×6): qty 1

## 2019-01-12 MED ORDER — PANTOPRAZOLE SODIUM 40 MG PO TBEC
40.0000 mg | DELAYED_RELEASE_TABLET | ORAL | Status: DC
  Administered 2019-01-13 – 2019-01-17 (×5): 40 mg via ORAL
  Filled 2019-01-12 (×5): qty 1

## 2019-01-12 MED ORDER — LORAZEPAM 2 MG/ML IJ SOLN
1.0000 mg | INTRAMUSCULAR | Status: AC | PRN
  Administered 2019-01-12: 19:00:00 1 mg via INTRAVENOUS
  Filled 2019-01-12: qty 1

## 2019-01-12 MED ORDER — ADULT MULTIVITAMIN W/MINERALS CH
1.0000 | ORAL_TABLET | Freq: Every day | ORAL | Status: DC
  Administered 2019-01-12 – 2019-01-25 (×13): 1 via ORAL
  Filled 2019-01-12 (×13): qty 1

## 2019-01-12 MED ORDER — LOSARTAN POTASSIUM 50 MG PO TABS
50.0000 mg | ORAL_TABLET | Freq: Every day | ORAL | Status: DC
  Administered 2019-01-12 – 2019-01-17 (×6): 50 mg via ORAL
  Filled 2019-01-12 (×6): qty 1

## 2019-01-12 MED ORDER — TRIAMTERENE-HCTZ 37.5-25 MG PO TABS: 1.0000 | ORAL_TABLET | Freq: Every day | ORAL | Status: DC

## 2019-01-12 MED ORDER — SODIUM CHLORIDE 0.9% FLUSH
3.0000 mL | Freq: Two times a day (BID) | INTRAVENOUS | Status: DC
  Administered 2019-01-12 – 2019-01-16 (×6): 3 mL via INTRAVENOUS

## 2019-01-12 MED ORDER — ASPIRIN EC 81 MG PO TBEC
81.0000 mg | DELAYED_RELEASE_TABLET | Freq: Every day | ORAL | Status: DC
  Administered 2019-01-13 – 2019-01-17 (×5): 81 mg via ORAL
  Filled 2019-01-12 (×5): qty 1

## 2019-01-12 MED ORDER — FOLIC ACID 1 MG PO TABS
1.0000 mg | ORAL_TABLET | Freq: Every day | ORAL | Status: DC
  Administered 2019-01-12 – 2019-01-25 (×13): 1 mg via ORAL
  Filled 2019-01-12 (×13): qty 1

## 2019-01-12 MED ORDER — SODIUM CHLORIDE 0.9% FLUSH: 3.0000 mL | INTRAVENOUS | Status: DC | PRN

## 2019-01-12 MED ORDER — SODIUM CHLORIDE 0.9 % IV SOLN: 250.0000 mL | INTRAVENOUS | Status: DC | PRN

## 2019-01-12 MED ORDER — QUETIAPINE FUMARATE 100 MG PO TABS
100.0000 mg | ORAL_TABLET | Freq: Every day | ORAL | Status: DC
  Administered 2019-01-12 – 2019-01-24 (×13): 100 mg via ORAL
  Filled 2019-01-12 (×5): qty 1
  Filled 2019-01-12: qty 2
  Filled 2019-01-12 (×2): qty 1
  Filled 2019-01-12 (×3): qty 2
  Filled 2019-01-12: qty 1
  Filled 2019-01-12 (×2): qty 2

## 2019-01-12 MED ORDER — THIAMINE HCL 100 MG/ML IJ SOLN
100.0000 mg | Freq: Every day | INTRAMUSCULAR | Status: DC
  Filled 2019-01-12: qty 2

## 2019-01-12 MED ORDER — LORAZEPAM 1 MG PO TABS
1.0000 mg | ORAL_TABLET | ORAL | Status: AC | PRN
  Administered 2019-01-12 – 2019-01-14 (×6): 1 mg via ORAL
  Filled 2019-01-12 (×6): qty 1

## 2019-01-12 MED ORDER — BUSPIRONE HCL 10 MG PO TABS
10.0000 mg | ORAL_TABLET | Freq: Three times a day (TID) | ORAL | Status: DC
  Administered 2019-01-12 – 2019-01-25 (×38): 10 mg via ORAL
  Filled 2019-01-12 (×2): qty 1
  Filled 2019-01-12: qty 2
  Filled 2019-01-12: qty 1
  Filled 2019-01-12: qty 2
  Filled 2019-01-12: qty 1
  Filled 2019-01-12: qty 2
  Filled 2019-01-12 (×3): qty 1
  Filled 2019-01-12: qty 2
  Filled 2019-01-12: qty 1
  Filled 2019-01-12: qty 2
  Filled 2019-01-12: qty 1
  Filled 2019-01-12 (×3): qty 2
  Filled 2019-01-12: qty 1
  Filled 2019-01-12: qty 2
  Filled 2019-01-12 (×2): qty 1
  Filled 2019-01-12: qty 2
  Filled 2019-01-12 (×3): qty 1
  Filled 2019-01-12 (×3): qty 2
  Filled 2019-01-12: qty 1
  Filled 2019-01-12 (×2): qty 2
  Filled 2019-01-12: qty 1
  Filled 2019-01-12: qty 2
  Filled 2019-01-12 (×5): qty 1
  Filled 2019-01-12: qty 2

## 2019-01-12 MED ORDER — GABAPENTIN 300 MG PO CAPS
300.0000 mg | ORAL_CAPSULE | Freq: Three times a day (TID) | ORAL | Status: DC
  Administered 2019-01-12 – 2019-01-25 (×37): 300 mg via ORAL
  Filled 2019-01-12 (×39): qty 1

## 2019-01-12 MED ORDER — PANTOPRAZOLE SODIUM 40 MG PO TBEC: 40.0000 mg | DELAYED_RELEASE_TABLET | Freq: Every day | ORAL | Status: DC

## 2019-01-12 MED ORDER — TRIAMTERENE 50 MG PO CAPS
50.0000 mg | ORAL_CAPSULE | Freq: Every day | ORAL | Status: DC
  Filled 2019-01-12: qty 1

## 2019-01-12 MED ORDER — ROSUVASTATIN CALCIUM 20 MG PO TABS
20.0000 mg | ORAL_TABLET | Freq: Every day | ORAL | Status: DC
  Administered 2019-01-12 – 2019-01-14 (×3): 20 mg via ORAL
  Filled 2019-01-12 (×5): qty 1

## 2019-01-12 MED ORDER — NICOTINE 14 MG/24HR TD PT24
14.0000 mg | MEDICATED_PATCH | Freq: Every day | TRANSDERMAL | Status: DC
  Administered 2019-01-12 – 2019-01-25 (×14): 14 mg via TRANSDERMAL
  Filled 2019-01-12 (×14): qty 1

## 2019-01-12 MED ORDER — CARVEDILOL 25 MG PO TABS
25.0000 mg | ORAL_TABLET | Freq: Two times a day (BID) | ORAL | Status: DC
  Administered 2019-01-12 – 2019-01-17 (×11): 25 mg via ORAL
  Filled 2019-01-12 (×8): qty 1
  Filled 2019-01-12: qty 2
  Filled 2019-01-12 (×2): qty 1

## 2019-01-12 MED ORDER — LORAZEPAM 1 MG PO TABS
1.0000 mg | ORAL_TABLET | Freq: Once | ORAL | Status: AC
  Administered 2019-01-12: 1 mg via ORAL
  Filled 2019-01-12: qty 1

## 2019-01-12 MED ORDER — HYDROCHLOROTHIAZIDE 25 MG PO TABS: 25.0000 mg | ORAL_TABLET | Freq: Every day | ORAL | Status: DC

## 2019-01-12 MED ORDER — ISOSORBIDE MONONITRATE ER 60 MG PO TB24
60.0000 mg | ORAL_TABLET | Freq: Every day | ORAL | Status: DC
  Administered 2019-01-12 – 2019-01-17 (×6): 60 mg via ORAL
  Filled 2019-01-12: qty 1
  Filled 2019-01-12: qty 2
  Filled 2019-01-12 (×4): qty 1

## 2019-01-12 MED ORDER — HEPARIN (PORCINE) 25000 UT/250ML-% IV SOLN
900.0000 [IU]/h | INTRAVENOUS | Status: DC
  Administered 2019-01-12 – 2019-01-14 (×3): 900 [IU]/h via INTRAVENOUS
  Filled 2019-01-12 (×3): qty 250

## 2019-01-12 MED ORDER — NITROGLYCERIN 0.4 MG SL SUBL
0.4000 mg | SUBLINGUAL_TABLET | SUBLINGUAL | Status: DC | PRN
  Administered 2019-01-12: 0.4 mg via SUBLINGUAL
  Filled 2019-01-12: qty 1

## 2019-01-12 NOTE — Progress Notes (Signed)
Progress Note  Patient Name: [name] Date of Encounter: 01/12/2019  Primary Cardiologist: Verdis PrimeHenry Smith MD  Subjective   Feels better but still complains of intermittent chest pressure  Inpatient Medications    Scheduled Meds: . sodium chloride flush  3 mL Intravenous Once   Continuous Infusions: . heparin 900 Units/hr (01/12/19 0648)   PRN Meds: nitroGLYCERIN   Vital Signs    Vitals:   01/12/19 0630 01/12/19 0645 01/12/19 0700 01/12/19 0703  BP: (!) 139/57 (!) 141/66 (!) 148/72   Pulse: (!) 59 68 70   Resp: (!) 21 18 15    Temp:      TempSrc:      SpO2: 98% 99% 99%   Weight:    84.8 kg  Height:    5\' 1"  (1.549 m)   No intake or output data in the 24 hours ending 01/12/19 0759 Last 3 Weights 01/12/2019 06/23/2018 06/07/2018  Weight (lbs) 187 lb 175 lb 170 lb  Weight (kg) 84.823 kg 79.379 kg 77.111 kg  Some encounter information is confidential and restricted. Go to Review Flowsheets activity to see all data.      Telemetry    NSR - Personally Reviewed  ECG    NSR with nonspecific T wave abnormality- Personally Reviewed  Physical Exam   GEN: Obese BF No acute distress.   Neck: No JVD Cardiac: RRR, no murmurs, rubs, or gallops. She does have tenderness to palpation in the anterior chest that is different than her chest pressure Respiratory: Clear to auscultation bilaterally. GI: Soft, + tenderness to palpation in the epigastrium, non-distended  MS: No edema; No deformity. Neuro:  Nonfocal  Psych: Normal affect   Labs    High Sensitivity Troponin:   Recent Labs  Lab 01/11/19 2313 01/12/19 0212  TROPONINIHS 12 32*      Chemistry Recent Labs  Lab 01/11/19 2313  NA 141  K 3.4*  CL 107  CO2 22  GLUCOSE 106*  BUN 10  CREATININE 0.75  CALCIUM 9.6  GFRNONAA >60  GFRAA >60  ANIONGAP 12     Hematology Recent Labs  Lab 01/11/19 2313  WBC 7.1  RBC 5.22*  HGB 14.7  HCT 46.4*  MCV 88.9  MCH 28.2  MCHC 31.7  RDW 12.5  PLT 322     BNPNo results for input(s): BNP, PROBNP in the last 168 hours.   DDimer No results for input(s): DDIMER in the last 168 hours.   Radiology    Dg Chest 2 View  Result Date: 01/11/2019 CLINICAL DATA:  53 year old female with chest pain. EXAM: CHEST - 2 VIEW COMPARISON:  Chest radiograph dated 01/30/2018. FINDINGS: The lungs are clear. There is no pleural effusion or pneumothorax. Stable cardiac silhouette. No acute osseous pathology. Calcific tendinopathy of the left shoulder. IMPRESSION: No active cardiopulmonary disease. Electronically Signed   By: Elgie CollardArash  Radparvar M.D.   On: 01/11/2019 23:29    Cardiac Studies    Myoview 01/31/18: CLINICAL DATA:  53 year old female history of coronary disease with prior percutaneous angioplasty and stenting to the circumflex and RCA.  EXAM: MYOCARDIAL IMAGING WITH SPECT (REST AND PHARMACOLOGIC-STRESS)  GATED LEFT VENTRICULAR WALL MOTION STUDY  LEFT VENTRICULAR EJECTION FRACTION  TECHNIQUE: Standard myocardial SPECT imaging was performed after resting intravenous injection of 10 mCi Tc-3574m tetrofosmin. Subsequently, intravenous infusion of Lexiscan was performed under the supervision of the Cardiology staff. At peak effect of the drug, 30 mCi Tc-5874m tetrofosmin was injected intravenously and standard myocardial SPECT imaging was performed.  Quantitative gated imaging was also performed to evaluate left ventricular wall motion, and estimate left ventricular ejection fraction.  COMPARISON:  None.  FINDINGS: EKG interpretation: No ST changes during pharmacological stress.  Perfusion: There is decreased radiotracer activity within the basilar segment of the lateral wall which improves from stress to rest. This is a moderate size defect of moderate to high severity. No additional decrease counts in the LEFT ventricle.  Wall Motion: Normal left ventricular wall motion. No left ventricular dilation.  Left Ventricular Ejection  Fraction: 43 %  End diastolic volume 100 ml  End systolic volume 57 ml  IMPRESSION: 1. Concern for reversible ischemia in the basilar segment of the LEFT lateral wall. Moderate size defect of moderate to high severity.  2. Normal left ventricular wall motion.  3. Left ventricular ejection fraction 43%  4. Non invasive risk stratification*: Intermediate  LHC 2019:  Prox RCA lesion is 10% stenosed.  Mid RCA lesion is 65% stenosed.  Dist RCA lesion is 65% stenosed.  Ost 1st Mrg to 1st Mrg lesion is 30% stenosed.  Dist Cx-1 lesion is 90% stenosed.  Dist Cx-2 lesion is 70% stenosed.  Prox Cx to Mid Cx lesion is 20% stenosed.  Ost 3rd Mrg lesion is 40% stenosed.  Dist LM to Ost LAD lesion is 40% stenosed.  Prox LAD to Mid LAD lesion is 50% stenosed.  Ost 1st Diag lesion is 40% stenosed.  Dist LAD lesion is 65% stenosed.  1. The left main is patent 2. The LAD has a mild ostial stenosis then moderate mid and distal disease that does not appear to be flow limiting. The distal LAD stenosis has been present since cath in 2015 and actually appears to be improved.  3. The Circumflex has a patent mid stented segment with mild restenosis. The distal AV groove circumflex beyond the last obtuse marginal branch has has chronic diffuse disease. This vessel is too small for PCI.  4. The RCA is a moderate to large caliber dominant vessel with a patent proximal stent with mild restenosis. The mid RCA has serial 50-70% stenoses. DFR (pressure wire assessment) of the mid RCA is 0.94 suggesting the stenoses are not flow limiting.  5. Elevated troponin felt to be due to demand ischemia from hypertensive urgency in the setting of moderate CAD  Recommendations: Medical management of CAD. Better BP control. Tobacco cessation. Follow up with Norma Fredrickson, NP or Dr. Garnette Scheuermann. OK to discharge home later today after bedrest is complete.   Patient Profile     53 y.o. female with  history of CAD s/p remote stent of RCA and LCx in 2012, ongoing tobacco and Etoh abuse, medical noncompliance presents with chest pain.   Assessment & Plan    1. Chest pain. Patient initially developed dizziness followed by N/V and then developed SSCP/pressure radiating to jaw. Ecg is without acute change. Troponin 12>>32. BP Was severely elevated on admission. Note extensive evaluation one year ago. Myoview showed some lateral ischemia. Cardiac cath showed moderate 3 vessel disease with patent stents in the RCA and LCx. No clear culprit. FFR of RCA lesion normal. Felt to be demand ischemia in setting of severe HTN.  Medical therapy recommended. Patient has been noncompliant with medical therapy. Not taking statin or losartan. Maybe taking Coreg. Has active tobacco and Etoh abuse. Has had no cardiac follow up in the past year. At this point I would recommend optimizing medical therapy for BP and angina. IV heparin for now and track  enzymes. Resume statin. I don't think a Myoview study will be helpful given findings of last year. I would only consider invasive evaluation if patient continues to have chest pain despite optimal BP and medication management. Would add Protonix for potential GI pain. I would hold off on starting IV Ntg for now. Patient may eat.  2, HTN - poorly controlled. Resume Coreg. Add losartan and oral nitrates.  3. HLD resume high dose statin 4. Tobacco abuse. Counseled on smoking cessation 5. Etoh abuse. CIWA protocol per medical team.   For questions or updates, please contact [place] Please consult [email or web address] for contact info under        Signed, Cherie Lasalle [place], MD  01/12/2019, 7:59 AM

## 2019-01-12 NOTE — ED Notes (Signed)
Stuck patient in the left ac couldn't get her blood patient is resting with call bell in reach

## 2019-01-12 NOTE — ED Notes (Signed)
Ray, RN on 6E to call back for report

## 2019-01-12 NOTE — ED Notes (Signed)
Ordered breakfast--Emalene Welte 

## 2019-01-12 NOTE — Progress Notes (Signed)
ANTICOAGULATION CONSULT NOTE  Pharmacy Consult for Heparin Indication: chest pain/ACS  Patient Measurements: Height: 5\' 1"  (154.9 cm) Weight: 187 lb (84.8 kg) IBW/kg (Calculated) : 47.8 Heparin Dosing Weight:   Vital Signs: BP: 103/69 (12/04 1330) Pulse Rate: 73 (12/04 1330)  Labs: Recent Labs    01/11/19 2313 01/12/19 0212 01/12/19 1051 01/12/19 1645  HGB 14.7  --   --   --   HCT 46.4*  --   --   --   PLT 322  --   --   --   LABPROT 13.2  --   --   --   INR 1.0  --   --   --   HEPARINUNFRC  --   --   --  0.59  CREATININE 0.75  --   --   --   TROPONINIHS 12 32* 191*  --     Estimated Creatinine Clearance: 81.3 mL/min (by C-G formula based on SCr of 0.75 mg/dL).  Assessment: 53 y.o. female with chest pain continues on IV heparin. Initial heparin level is therapeutic. No bleeding noted.   Goal of Therapy:  Heparin level 0.3-0.7 units/ml Monitor platelets by anticoagulation protocol: Yes   Plan:  Continue heparin gtt 900 units/hr Daily heparin level and CBC  Mertha Clyatt, Rande Lawman 01/12/2019,5:33 PM
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
## 2019-01-12 NOTE — Plan of Care (Signed)

## 2019-01-12 NOTE — ED Notes (Signed)
Dr. Lorin Mercy aware of Trop. [place]

## 2019-01-12 NOTE — ED Notes (Signed)
Lunch Tray Ordered @ 1020.  

## 2019-01-12 NOTE — ED Notes (Signed)
NPO--NO BREAKFAST ORDERED--[name] 
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
## 2019-01-12 NOTE — ED Notes (Signed)
Cardiology at bedside.

## 2019-01-13 DIAGNOSIS — I200 Unstable angina: Secondary | ICD-10-CM

## 2019-01-13 LAB — LIPID PANEL
Cholesterol: 145 mg/dL (ref 0–200)
HDL: 38 mg/dL — ABNORMAL LOW (ref >40)
LDL Cholesterol: 86 mg/dL (ref 0–99)
Total CHOL/HDL Ratio: 3.8 RATIO
Triglycerides: 107 mg/dL (ref <150)
VLDL: 21 mg/dL (ref 0–40)

## 2019-01-13 LAB — BASIC METABOLIC PANEL
Anion gap: 9 (ref 5–15)
BUN: 13 mg/dL (ref 6–20)
CO2: 25 mmol/L (ref 22–32)
Calcium: 9.2 mg/dL (ref 8.9–10.3)
Chloride: 105 mmol/L (ref 98–111)
Creatinine, Ser: 0.69 mg/dL (ref 0.44–1.00)
GFR calc Af Amer: >60 mL/min (ref >60)
GFR calc non Af Amer: >60 mL/min (ref >60)
Glucose, Bld: 114 mg/dL — ABNORMAL HIGH (ref 70–99)
Potassium: 3.4 mmol/L — ABNORMAL LOW (ref 3.5–5.1)
Sodium: 139 mmol/L (ref 135–145)

## 2019-01-13 LAB — CBC
HCT: 41.8 % (ref 36.0–46.0)
Hemoglobin: 13.4 g/dL (ref 12.0–15.0)
MCH: 28.3 pg (ref 26.0–34.0)
MCHC: 32.1 g/dL (ref 30.0–36.0)
MCV: 88.2 fL (ref 80.0–100.0)
Platelets: 294 10*3/uL (ref 150–400)
RBC: 4.74 MIL/uL (ref 3.87–5.11)
RDW: 12.8 % (ref 11.5–15.5)
WBC: 6.8 10*3/uL (ref 4.0–10.5)
nRBC: 0 % (ref 0.0–0.2)

## 2019-01-13 LAB — MAGNESIUM: Magnesium: 2 mg/dL (ref 1.7–2.4)

## 2019-01-13 LAB — HEPARIN LEVEL (UNFRACTIONATED): Heparin Unfractionated: 0.56 IU/mL (ref 0.30–0.70)

## 2019-01-13 LAB — PROTIME-INR
INR: 1 (ref 0.8–1.2)
Prothrombin Time: 13.3 seconds (ref 11.4–15.2)

## 2019-01-13 MED ORDER — SODIUM CHLORIDE 0.9 % WEIGHT BASED INFUSION
1.0000 mL/kg/h | INTRAVENOUS | Status: DC
  Administered 2019-01-15: 1 mL/kg/h via INTRAVENOUS

## 2019-01-13 MED ORDER — SODIUM CHLORIDE 0.9 % WEIGHT BASED INFUSION
3.0000 mL/kg/h | INTRAVENOUS | Status: AC
  Administered 2019-01-15: 3 mL/kg/h via INTRAVENOUS

## 2019-01-13 MED ORDER — POTASSIUM CHLORIDE CRYS ER 20 MEQ PO TBCR
40.0000 meq | EXTENDED_RELEASE_TABLET | Freq: Once | ORAL | Status: AC
  Administered 2019-01-13: 09:00:00 40 meq via ORAL
  Filled 2019-01-13: qty 2

## 2019-01-13 NOTE — Progress Notes (Signed)
ANTICOAGULATION CONSULT NOTE  Pharmacy Consult for Heparin Indication: chest pain/ACS  Patient Measurements: Height: 5\' 1"  (154.9 cm) Weight: 187 lb 2.7 oz (84.9 kg) IBW/kg (Calculated) : 47.8 Heparin Dosing Weight:   Vital Signs: Temp: 98 F (36.7 C) (12/05 0557) Temp Source: Oral (12/05 0557) BP: 134/72 (12/05 0557) Pulse Rate: 58 (12/05 0557)  Labs: Recent Labs    01/11/19 2313  01/12/19 1051 01/12/19 1645 01/12/19 1908 01/12/19 2111 01/13/19 0342  HGB 14.7  --   --   --   --   --  13.4  HCT 46.4*  --   --   --   --   --  41.8  PLT 322  --   --   --   --   --  294  LABPROT 13.2  --   --   --   --   --  13.3  INR 1.0  --   --   --   --   --  1.0  HEPARINUNFRC  --   --   --  0.59  --   --  0.56  CREATININE 0.75  --   --   --   --   --  0.69  TROPONINIHS 12   < > 191*  --  515* 572*  --    < > = values in this interval not displayed.    Estimated Creatinine Clearance: 81.3 mL/min (by C-G formula based on SCr of 0.69 mg/dL).  Assessment: 53 y.o. female with chest pain continues on IV heparin Heparin level therapeutic this AM CBC stable  Goal of Therapy:  Heparin level 0.3-0.7 units/ml Monitor platelets by anticoagulation protocol: Yes   Plan:  Continue heparin gtt 900 units/hr Daily heparin level and CBC  Thank you Anette Guarneri, PharmD 01/13/2019,7:15 AM

## 2019-01-13 NOTE — Progress Notes (Signed)
Progress Note  Patient Name: Titilayo Hagans Date of Encounter: 01/13/2019  Primary Cardiologist:   No primary care provider on file.   Subjective   She says that currently she is not having pain.  Feels vague SOB.    Inpatient Medications    Scheduled Meds: . aspirin EC  81 mg Oral Daily  . busPIRone  10 mg Oral TID  . carvedilol  25 mg Oral BID WC  . escitalopram  20 mg Oral Daily  . folic acid  1 mg Oral Daily  . gabapentin  300 mg Oral TID  . hydrochlorothiazide  25 mg Oral Daily  . isosorbide mononitrate  60 mg Oral Daily  . losartan  50 mg Oral Daily  . multivitamin with minerals  1 tablet Oral Daily  . nicotine  14 mg Transdermal Daily  . pantoprazole  40 mg Oral BH-q7a  . QUEtiapine  100 mg Oral QHS  . rosuvastatin  20 mg Oral q1800  . sodium chloride flush  3 mL Intravenous Q12H  . thiamine  100 mg Oral Daily   Or  . thiamine  100 mg Intravenous Daily   Continuous Infusions: . sodium chloride    . heparin 900 Units/hr (01/13/19 0636)   PRN Meds: sodium chloride, acetaminophen, albuterol, LORazepam **OR** LORazepam, nitroGLYCERIN, ondansetron (ZOFRAN) IV, sodium chloride flush   Vital Signs    Vitals:   01/12/19 1330 01/12/19 1827 01/12/19 2100 01/13/19 0557  BP: 103/69 118/75 111/63 134/72  Pulse: 73 71  (!) 58  Resp: 13   14  Temp:  98.3 F (36.8 C)  98 F (36.7 C)  TempSrc:  Oral  Oral  SpO2: 98% 100%  100%  Weight:  84.9 kg  84.9 kg  Height:  5\' 1"  (1.549 m)      Intake/Output Summary (Last 24 hours) at 01/13/2019 0753 Last data filed at 01/12/2019 2300 Gross per 24 hour  Intake 185.25 ml  Output -  Net 185.25 ml   Filed Weights   01/12/19 0703 01/12/19 1827 01/13/19 0557  Weight: 84.8 kg 84.9 kg 84.9 kg    Telemetry    NA  - Personally Reviewed  ECG    NA - Personally Reviewed  Physical Exam   GEN: No acute distress.   Neck: No  JVD Cardiac: RRR, no murmurs, rubs, or gallops.  Respiratory: Clear  to auscultation bilaterally.  GI: Soft, nontender, non-distended  MS: No  edema; No deformity. Neuro:  Nonfocal  Psych: Normal affect   Labs    Chemistry Recent Labs  Lab 01/11/19 2313 01/13/19 0342  NA 141 139  K 3.4* 3.4*  CL 107 105  CO2 22 25  GLUCOSE 106* 114*  BUN 10 13  CREATININE 0.75 0.69  CALCIUM 9.6 9.2  GFRNONAA >60 >60  GFRAA >60 >60  ANIONGAP 12 9     Hematology Recent Labs  Lab 01/11/19 2313 01/13/19 0342  WBC 7.1 6.8  RBC 5.22* 4.74  HGB 14.7 13.4  HCT 46.4* 41.8  MCV 88.9 88.2  MCH 28.2 28.3  MCHC 31.7 32.1  RDW 12.5 12.8  PLT 322 294    Cardiac EnzymesNo results for input(s): TROPONINI in the last 168 hours. No results for input(s): TROPIPOC in the last 168 hours.   BNPNo results for input(s): BNP, PROBNP in the last 168 hours.   DDimer No results for input(s): DDIMER in the last 168 hours.   Radiology    Dg Chest 2 View  Result  Date: 01/11/2019 CLINICAL DATA:  53 year old female with chest pain. EXAM: CHEST - 2 VIEW COMPARISON:  Chest radiograph dated 01/30/2018. FINDINGS: The lungs are clear. There is no pleural effusion or pneumothorax. Stable cardiac silhouette. No acute osseous pathology. Calcific tendinopathy of the left shoulder. IMPRESSION: No active cardiopulmonary disease. Electronically Signed   By: Elgie Collard M.D.   On: 01/11/2019 23:29    Cardiac Studies   NA   Patient Profile     53 y.o. female with history of CAD s/p remote stent of RCA and LCx in 2012, ongoing tobacco and Etoh abuse, medical noncompliance presents with chest pain.  Assessment & Plan    Chest pain.   Trop did go up.    Given this I think cardiac cath is indicated.  I will put her on the board for Monday.    Continue IV heparin.    HTN :  The blood pressure is at target. It looks like it is well controlled on current meds.  No change in therapy.   HLD:  Resumed high dose statin.   Tobacco abuse:  She has been educated.     For questions or updates, please contact  [facility] Please consult [email or web address] for contact info under Cardiology/STEMI.   Signed, Rollene Rotunda, MD  01/13/2019, 7:53 AM

## 2019-01-13 NOTE — Plan of Care (Signed)

## 2019-01-14 LAB — HEPARIN LEVEL (UNFRACTIONATED): Heparin Unfractionated: 0.54 IU/mL (ref 0.30–0.70)

## 2019-01-14 LAB — CBC
HCT: 41.5 % (ref 36.0–46.0)
Hemoglobin: 13.2 g/dL (ref 12.0–15.0)
MCH: 28.3 pg (ref 26.0–34.0)
MCHC: 31.8 g/dL (ref 30.0–36.0)
MCV: 88.9 fL (ref 80.0–100.0)
Platelets: 310 10*3/uL (ref 150–400)
RBC: 4.67 MIL/uL (ref 3.87–5.11)
RDW: 13 % (ref 11.5–15.5)
WBC: 6.8 10*3/uL (ref 4.0–10.5)
nRBC: 0 % (ref 0.0–0.2)

## 2019-01-14 NOTE — Plan of Care (Signed)
  Problem: Activity: Goal: Risk for activity intolerance will decrease Outcome: Progressing   Problem: Nutrition: Goal: Adequate nutrition will be maintained Outcome: Progressing   Problem: Coping: Goal: Level of anxiety will decrease Outcome: Progressing   Problem: Elimination: Goal: Will not experience complications related to bowel motility Outcome: Progressing   Problem: Elimination: Goal: Will not experience complications related to urinary retention Outcome: Progressing   Problem: Pain Managment: Goal: General experience of comfort will improve Outcome: Progressing   Problem: Safety: Goal: Ability to remain free from injury will improve Outcome: Progressing

## 2019-01-14 NOTE — Progress Notes (Signed)
ANTICOAGULATION CONSULT NOTE  Pharmacy Consult for Heparin Indication: chest pain/ACS  Patient Measurements: Height: 5\' 1"  (154.9 cm) Weight: 186 lb 9.6 oz (84.6 kg) IBW/kg (Calculated) : 47.8 Heparin Dosing Weight:   Vital Signs: Temp: 97.8 F (36.6 C) (12/06 0430) Temp Source: Oral (12/06 0430) BP: 137/75 (12/06 0430) Pulse Rate: 72 (12/06 0430)  Labs: Recent Labs    01/11/19 2313  01/12/19 1051 01/12/19 1645 01/12/19 1908 01/12/19 2111 01/13/19 0342 01/14/19 0416  HGB 14.7  --   --   --   --   --  13.4 13.2  HCT 46.4*  --   --   --   --   --  41.8 41.5  PLT 322  --   --   --   --   --  294 310  LABPROT 13.2  --   --   --   --   --  13.3  --   INR 1.0  --   --   --   --   --  1.0  --   HEPARINUNFRC  --   --   --  0.59  --   --  0.56 0.54  CREATININE 0.75  --   --   --   --   --  0.69  --   TROPONINIHS 12   < > 191*  --  515* 572*  --   --    < > = values in this interval not displayed.    Estimated Creatinine Clearance: 81.2 mL/min (by C-G formula based on SCr of 0.69 mg/dL).  Assessment: 53 y.o. female with chest pain continues on IV heparin Heparin level therapeutic this AM CBC stable  Cath planned  Goal of Therapy:  Heparin level 0.3-0.7 units/ml Monitor platelets by anticoagulation protocol: Yes   Plan:  Continue heparin gtt 900 units/hr Daily heparin level and CBC  Thank you Anette Guarneri, PharmD 01/14/2019,7:17 AM

## 2019-01-14 NOTE — Progress Notes (Signed)
CSW met with patient bedside to acknowledge consult for alcohol. Patient admits to drinking however stated hat she has a sponsor and now is more aware of her limitations. Patient also asked about outpatient therapy providers and CSW provided her that information.  Consult is being closed out.

## 2019-01-14 NOTE — Progress Notes (Addendum)
Progress Note  Patient Name: [name] Date of Encounter: 01/14/2019  Primary Cardiologist:   No primary care provider on file.   Subjective   No chest pain overnight.  No SOB.   Inpatient Medications    Scheduled Meds: . aspirin EC  81 mg Oral Daily  . busPIRone  10 mg Oral TID  . carvedilol  25 mg Oral BID WC  . escitalopram  20 mg Oral Daily  . folic acid  1 mg Oral Daily  . gabapentin  300 mg Oral TID  . hydrochlorothiazide  25 mg Oral Daily  . isosorbide mononitrate  60 mg Oral Daily  . losartan  50 mg Oral Daily  . multivitamin with minerals  1 tablet Oral Daily  . nicotine  14 mg Transdermal Daily  . pantoprazole  40 mg Oral BH-q7a  . QUEtiapine  100 mg Oral QHS  . rosuvastatin  20 mg Oral q1800  . sodium chloride flush  3 mL Intravenous Q12H  . thiamine  100 mg Oral Daily   Continuous Infusions: . sodium chloride    . [START ON 01/15/2019] sodium chloride     Followed by  . [START ON 01/15/2019] sodium chloride    . heparin 900 Units/hr (01/14/19 0624)   PRN Meds: sodium chloride, acetaminophen, albuterol, LORazepam **OR** LORazepam, nitroGLYCERIN, ondansetron (ZOFRAN) IV, sodium chloride flush   Vital Signs    Vitals:   01/13/19 1937 01/13/19 2337 01/14/19 0426 01/14/19 0430  BP: 119/63 (!) 117/57  137/75  Pulse: 69 83  72  Resp:      Temp: 98.2 F (36.8 C) 98.2 F (36.8 C)  97.8 F (36.6 C)  TempSrc: Oral Oral  Oral  SpO2: 98% 99%  100%  Weight:   84.6 kg   Height:        Intake/Output Summary (Last 24 hours) at 01/14/2019 0809 Last data filed at 01/13/2019 2200 Gross per 24 hour  Intake 567 ml  Output -  Net 567 ml   Filed Weights   01/12/19 1827 01/13/19 0557 01/14/19 0426  Weight: 84.9 kg 84.9 kg 84.6 kg    Telemetry    NSR - Personally Reviewed  ECG    NA - Personally Reviewed  Physical Exam   GEN: No acute distress.   Neck: No  JVD Cardiac: RRR, no murmurs, rubs, or gallops.  Respiratory: Clear  to auscultation  bilaterally. GI: Soft, nontender, non-distended  MS: No  edema; No deformity. Neuro:  Nonfocal  Psych: Normal affect   Labs    Chemistry Recent Labs  Lab 01/11/19 2313 01/13/19 0342  NA 141 139  K 3.4* 3.4*  CL 107 105  CO2 22 25  GLUCOSE 106* 114*  BUN 10 13  CREATININE 0.75 0.69  CALCIUM 9.6 9.2  GFRNONAA >60 >60  GFRAA >60 >60  ANIONGAP 12 9     Hematology Recent Labs  Lab 01/11/19 2313 01/13/19 0342 01/14/19 0416  WBC 7.1 6.8 6.8  RBC 5.22* 4.74 4.67  HGB 14.7 13.4 13.2  HCT 46.4* 41.8 41.5  MCV 88.9 88.2 88.9  MCH 28.2 28.3 28.3  MCHC 31.7 32.1 31.8  RDW 12.5 12.8 13.0  PLT 322 294 310    Cardiac EnzymesNo results for input(s): TROPONINI in the last 168 hours. No results for input(s): TROPIPOC in the last 168 hours.   BNPNo results for input(s): BNP, PROBNP in the last 168 hours.   DDimer No results for input(s): DDIMER in the last  168 hours.   Radiology    No results found.  Cardiac Studies   NA  Patient Profile     53 y.o. female with history of CAD s/p remote stent of RCA and LCx in 2012, ongoing tobacco and Etoh abuse, medical noncompliance presents with chest pain.  Assessment & Plan    CHEST PAIN:   Elevated troponin.  Given the mild trop elevation, after I reviewed the previous films, I think another cath is indicated.  The patient understands that risks included but are not limited to stroke (1 in 1000), death (1 in 35), kidney failure [usually temporary] (1 in 500), bleeding (1 in 200), allergic reaction [possibly serious] (1 in 200).  The patient understands and agrees to proceed.      HTN:   BP is easily controlled in hospital on current meds.  Continue current meds.   DYSLIPIDEMIA:   Resumed high dose statin.     TOBACCO ABUSE:  She has been edcuated.  She says today that she quit.   NUTRITION CONSULT:  The patient requests this.   For questions or updates, please contact [facility] Please consult [email or web address] for  contact info under Cardiology/STEMI.   Signed, Minus Breeding, MD  01/14/2019, 8:09 AM

## 2019-01-15 ENCOUNTER — Encounter (HOSPITAL_COMMUNITY)
Admission: EM | Disposition: A | Discharge status: Home / Self Care | Payer: Self-pay | Source: Home / Self Care | Attending: Thoracic Surgery (Cardiothoracic Vascular Surgery)

## 2019-01-15 ENCOUNTER — Encounter (HOSPITAL_COMMUNITY): Payer: Self-pay | Admitting: Cardiovascular Disease

## 2019-01-15 DIAGNOSIS — I25110 Atherosclerotic heart disease of native coronary artery with unstable angina pectoris: Secondary | ICD-10-CM

## 2019-01-15 DIAGNOSIS — E785 Hyperlipidemia, unspecified: Secondary | ICD-10-CM

## 2019-01-15 DIAGNOSIS — I10 Essential (primary) hypertension: Secondary | ICD-10-CM

## 2019-01-15 HISTORY — PX: LEFT HEART CATH AND CORONARY ANGIOGRAPHY: CATH118249

## 2019-01-15 LAB — CBC
HCT: 42.6 % (ref 36.0–46.0)
Hemoglobin: 13.5 g/dL (ref 12.0–15.0)
MCH: 28.4 pg (ref 26.0–34.0)
MCHC: 31.7 g/dL (ref 30.0–36.0)
MCV: 89.5 fL (ref 80.0–100.0)
Platelets: 316 10*3/uL (ref 150–400)
RBC: 4.76 MIL/uL (ref 3.87–5.11)
RDW: 12.9 % (ref 11.5–15.5)
WBC: 6.7 10*3/uL (ref 4.0–10.5)
nRBC: 0 % (ref 0.0–0.2)

## 2019-01-15 LAB — POTASSIUM: Potassium: 3.8 mmol/L (ref 3.5–5.1)

## 2019-01-15 LAB — HEPARIN LEVEL (UNFRACTIONATED): Heparin Unfractionated: 0.39 IU/mL (ref 0.30–0.70)

## 2019-01-15 SURGERY — LEFT HEART CATH AND CORONARY ANGIOGRAPHY
Anesthesia: LOCAL

## 2019-01-15 MED ORDER — HEPARIN SODIUM (PORCINE) 1000 UNIT/ML IJ SOLN
INTRAMUSCULAR | Status: AC
  Filled 2019-01-15: qty 1

## 2019-01-15 MED ORDER — ALPRAZOLAM 0.25 MG PO TABS
0.2500 mg | ORAL_TABLET | Freq: Two times a day (BID) | ORAL | Status: DC | PRN
  Administered 2019-01-15 – 2019-01-23 (×9): 0.25 mg via ORAL
  Filled 2019-01-15 (×11): qty 1

## 2019-01-15 MED ORDER — ROSUVASTATIN CALCIUM 20 MG PO TABS
40.0000 mg | ORAL_TABLET | Freq: Every day | ORAL | Status: DC
  Administered 2019-01-15 – 2019-01-23 (×8): 40 mg via ORAL
  Filled 2019-01-15 (×8): qty 2

## 2019-01-15 MED ORDER — VERAPAMIL HCL 2.5 MG/ML IV SOLN
INTRAVENOUS | Status: DC | PRN
  Administered 2019-01-15: 10 mL via INTRA_ARTERIAL

## 2019-01-15 MED ORDER — SODIUM CHLORIDE 0.9% FLUSH
3.0000 mL | Freq: Two times a day (BID) | INTRAVENOUS | Status: DC
  Administered 2019-01-15 – 2019-01-17 (×2): 3 mL via INTRAVENOUS

## 2019-01-15 MED ORDER — SODIUM CHLORIDE 0.9 % IV SOLN
INTRAVENOUS | Status: DC
  Administered 2019-01-15 – 2019-01-17 (×3): via INTRAVENOUS

## 2019-01-15 MED ORDER — ASPIRIN 81 MG PO CHEW
81.0000 mg | CHEWABLE_TABLET | Freq: Every day | ORAL | Status: DC
  Filled 2019-01-15: qty 1

## 2019-01-15 MED ORDER — HEPARIN (PORCINE) IN NACL 1000-0.9 UT/500ML-% IV SOLN
INTRAVENOUS | Status: DC | PRN
  Administered 2019-01-15 (×2): 500 mL

## 2019-01-15 MED ORDER — HEPARIN SODIUM (PORCINE) 1000 UNIT/ML IJ SOLN
INTRAMUSCULAR | Status: DC | PRN
  Administered 2019-01-15: 4500 [IU] via INTRAVENOUS

## 2019-01-15 MED ORDER — HEPARIN (PORCINE) 25000 UT/250ML-% IV SOLN
1100.0000 [IU]/h | INTRAVENOUS | Status: DC
  Administered 2019-01-16: 900 [IU]/h via INTRAVENOUS
  Administered 2019-01-17 – 2019-01-18 (×2): 1100 [IU]/h via INTRAVENOUS
  Filled 2019-01-15 (×3): qty 250

## 2019-01-15 MED ORDER — LABETALOL HCL 5 MG/ML IV SOLN: 10.0000 mg | INTRAVENOUS | Status: AC | PRN

## 2019-01-15 MED ORDER — MIDAZOLAM HCL 2 MG/2ML IJ SOLN
INTRAMUSCULAR | Status: AC
  Filled 2019-01-15: qty 2

## 2019-01-15 MED ORDER — MIDAZOLAM HCL 2 MG/2ML IJ SOLN
INTRAMUSCULAR | Status: DC | PRN
  Administered 2019-01-15: 2 mg via INTRAVENOUS
  Administered 2019-01-15 (×2): 1 mg via INTRAVENOUS

## 2019-01-15 MED ORDER — SODIUM CHLORIDE 0.9% FLUSH: 3.0000 mL | INTRAVENOUS | Status: DC | PRN

## 2019-01-15 MED ORDER — LIDOCAINE HCL (PF) 1 % IJ SOLN
INTRAMUSCULAR | Status: DC | PRN
  Administered 2019-01-15: 2 mL

## 2019-01-15 MED ORDER — DIAZEPAM 5 MG PO TABS
5.0000 mg | ORAL_TABLET | Freq: Four times a day (QID) | ORAL | Status: DC | PRN
  Administered 2019-01-15 – 2019-01-17 (×6): 5 mg via ORAL
  Filled 2019-01-15 (×6): qty 1

## 2019-01-15 MED ORDER — FENTANYL CITRATE (PF) 100 MCG/2ML IJ SOLN
INTRAMUSCULAR | Status: AC
  Filled 2019-01-15: qty 2

## 2019-01-15 MED ORDER — IOHEXOL 350 MG/ML SOLN
INTRAVENOUS | Status: DC | PRN
  Administered 2019-01-15: 75 mL

## 2019-01-15 MED ORDER — LIDOCAINE HCL (PF) 1 % IJ SOLN
INTRAMUSCULAR | Status: AC
  Filled 2019-01-15: qty 30

## 2019-01-15 MED ORDER — ONDANSETRON HCL 4 MG/2ML IJ SOLN: 4.0000 mg | Freq: Four times a day (QID) | INTRAMUSCULAR | Status: DC | PRN

## 2019-01-15 MED ORDER — ACETAMINOPHEN 325 MG PO TABS: 650.0000 mg | ORAL_TABLET | ORAL | Status: DC | PRN

## 2019-01-15 MED ORDER — SODIUM CHLORIDE 0.9 % IV SOLN: 250.0000 mL | INTRAVENOUS | Status: DC | PRN

## 2019-01-15 MED ORDER — HYDRALAZINE HCL 20 MG/ML IJ SOLN: 10.0000 mg | INTRAMUSCULAR | Status: AC | PRN

## 2019-01-15 MED ORDER — FENTANYL CITRATE (PF) 100 MCG/2ML IJ SOLN
INTRAMUSCULAR | Status: DC | PRN
  Administered 2019-01-15: 25 ug via INTRAVENOUS
  Administered 2019-01-15: 50 ug via INTRAVENOUS

## 2019-01-15 MED ORDER — HEPARIN (PORCINE) IN NACL 1000-0.9 UT/500ML-% IV SOLN
INTRAVENOUS | Status: AC
  Filled 2019-01-15: qty 1000

## 2019-01-15 SURGICAL SUPPLY — 10 items
CATH INFINITI JR4 5F (CATHETERS) ×1 IMPLANT
CATH OPTITORQUE TIG 4.0 5F (CATHETERS) ×1 IMPLANT
DEVICE RAD COMP TR BAND LRG (VASCULAR PRODUCTS) ×1 IMPLANT
GLIDESHEATH SLEND SS 6F .021 (SHEATH) ×1 IMPLANT
GUIDEWIRE INQWIRE 1.5J.035X260 (WIRE) IMPLANT
INQWIRE 1.5J .035X260CM (WIRE) ×2
KIT HEART LEFT (KITS) ×2 IMPLANT
PACK CARDIAC CATHETERIZATION (CUSTOM PROCEDURE TRAY) ×2 IMPLANT
TRANSDUCER W/STOPCOCK (MISCELLANEOUS) ×2 IMPLANT
TUBING CIL FLEX 10 FLL-RA (TUBING) ×2 IMPLANT

## 2019-01-15 NOTE — Progress Notes (Signed)
ANTICOAGULATION CONSULT NOTE  Pharmacy Consult for Heparin Indication: chest pain/ACS  Patient Measurements: Height: 5\' 1"  (154.9 cm) Weight: 186 lb 9.6 oz (84.6 kg) IBW/kg (Calculated) : 47.8 Heparin Dosing Weight: 67.3 kg  Vital Signs: Temp: 97.5 F (36.4 C) (12/07 0748) Temp Source: Oral (12/07 0748) BP: 163/88 (12/07 0748) Pulse Rate: 61 (12/07 0748)  Labs: Recent Labs    01/12/19 1051  01/12/19 1908 01/12/19 2111  01/13/19 0342 01/14/19 0416 01/15/19 0349  HGB  --   --   --   --    < > 13.4 13.2 13.5  HCT  --   --   --   --   --  41.8 41.5 42.6  PLT  --   --   --   --   --  294 310 316  LABPROT  --   --   --   --   --  13.3  --   --   INR  --   --   --   --   --  1.0  --   --   HEPARINUNFRC  --    < >  --   --   --  0.56 0.54 0.39  CREATININE  --   --   --   --   --  0.69  --   --   TROPONINIHS 191*  --  515* 572*  --   --   --   --    < > = values in this interval not displayed.    Estimated Creatinine Clearance: 81.2 mL/min (by C-G formula based on SCr of 0.69 mg/dL).   . sodium chloride    . sodium chloride 1 mL/kg/hr (01/15/19 0513)  . heparin 900 Units/hr (01/14/19 9518)     Assessment: 53 y.o. female with chest pain continues on IV heparin.  Heparin level this AM within goal range.  No overt bleeding or complications noted.  CBC stable  Cath planned Monday 12/7  Goal of Therapy:  Heparin level 0.3-0.7 units/ml Monitor platelets by anticoagulation protocol: Yes   Plan:  Continue heparin gtt 900 units/hr Daily heparin level and CBC F/u plans for anticoagulation after cath lab.  Marguerite Olea, [facility] Clinical Pharmacist Phone [phone]  01/15/2019 7:55 AM

## 2019-01-15 NOTE — Interval H&P Note (Signed)
Cath Lab Visit (complete for each Cath Lab visit)  Clinical Evaluation Leading to the Procedure:   ACS: No.  Non-ACS:    Anginal Classification: CCS III  Anti-ischemic medical therapy: Maximal Therapy (2 or more classes of medications)  Non-Invasive Test Results: No non-invasive testing performed  Prior CABG: No previous CABG      History and Physical Interval Note:  01/15/2019 3:07 PM  [name]  has presented today for surgery, with the diagnosis of NSTEMI.  The various methods of treatment have been discussed with the patient and family. After consideration of risks, benefits and other options for treatment, the patient has consented to  Procedure(s): LEFT HEART CATH AND CORONARY ANGIOGRAPHY (N/A) as a surgical intervention.  The patient's history has been reviewed, patient examined, no change in status, stable for surgery.  I have reviewed the patient's chart and labs.  Questions were answered to the patient's satisfaction.     Shelva Majestic

## 2019-01-15 NOTE — Progress Notes (Signed)
ANTICOAGULATION CONSULT NOTE  Pharmacy Consult for Heparin Indication: chest pain/ACS  Patient Measurements: Height: 5\' 1"  (154.9 cm) Weight: 186 lb 9.6 oz (84.6 kg) IBW/kg (Calculated) : 47.8 Heparin Dosing Weight: 67.3 kg  Vital Signs: Temp: 98.4 F (36.9 C) (12/07 1149) Temp Source: Oral (12/07 1149) BP: 158/84 (12/07 1557) Pulse Rate: 70 (12/07 1557)  Labs: Recent Labs    01/12/19 1908 01/12/19 2111  01/13/19 0342 01/14/19 0416 01/15/19 0349  HGB  --   --    < > 13.4 13.2 13.5  HCT  --   --   --  41.8 41.5 42.6  PLT  --   --   --  294 310 316  LABPROT  --   --   --  13.3  --   --   INR  --   --   --  1.0  --   --   HEPARINUNFRC  --   --   --  0.56 0.54 0.39  CREATININE  --   --   --  0.69  --   --   TROPONINIHS 515* 572*  --   --   --   --    < > = values in this interval not displayed.    Estimated Creatinine Clearance: 81.2 mL/min (by C-G formula based on SCr of 0.69 mg/dL).   . sodium chloride    . heparin 900 Units/hr (01/14/19 2330)     Assessment: 79 YOF s/p cath and found to have severe CAD. Pharmacy consulted to resume Heparin 8 hours s/p sheath removal.  Per the cath report - the sheath was removed at 1600. Will resume heparin at midnight without a bolus at the previously known therapeutic rate.   Goal of Therapy:  Heparin level 0.3-0.7 units/ml Monitor platelets by anticoagulation protocol: Yes   Plan:  - Restart Heparin at 900 units/hr starting at 0000 on 12/8 - Will continue to monitor for any signs/symptoms of bleeding and will follow up with heparin level in 6 hours after restarting  Thank you for allowing pharmacy to be a part of this patient's care.  Alycia Rossetti, PharmD, BCPS Clinical Pharmacist Clinical phone for 01/15/2019: [phone] 01/15/2019 5:24 PM   **Pharmacist phone directory can now be found on [email or web address] ([email or web address]).  Listed under [facility].

## 2019-01-15 NOTE — Plan of Care (Signed)
  Problem: Education: Goal: Knowledge of General Education information will improve Description: Including pain rating scale, medication(s)/side effects and non-pharmacologic comfort measures Outcome: Progressing   Problem: Clinical Measurements: Goal: Ability to maintain clinical measurements within normal limits will improve Outcome: Progressing   

## 2019-01-15 NOTE — H&P (View-Only) (Signed)
Progress Note  Patient Name: [name] Date of Encounter: 01/15/2019  Primary Cardiologist: Belva Crome III, MD   Subjective   Pt complaining of chest pain and jaw pain. She had not had her imdur yet this morning.  Inpatient Medications    Scheduled Meds: . aspirin EC  81 mg Oral Daily  . busPIRone  10 mg Oral TID  . carvedilol  25 mg Oral BID WC  . escitalopram  20 mg Oral Daily  . folic acid  1 mg Oral Daily  . gabapentin  300 mg Oral TID  . hydrochlorothiazide  25 mg Oral Daily  . isosorbide mononitrate  60 mg Oral Daily  . losartan  50 mg Oral Daily  . multivitamin with minerals  1 tablet Oral Daily  . nicotine  14 mg Transdermal Daily  . pantoprazole  40 mg Oral BH-q7a  . QUEtiapine  100 mg Oral QHS  . rosuvastatin  20 mg Oral q1800  . sodium chloride flush  3 mL Intravenous Q12H  . thiamine  100 mg Oral Daily   Continuous Infusions: . sodium chloride    . sodium chloride 1 mL/kg/hr (01/15/19 0513)  . heparin 900 Units/hr (01/14/19 0624)   PRN Meds: sodium chloride, acetaminophen, albuterol, LORazepam **OR** LORazepam, nitroGLYCERIN, ondansetron (ZOFRAN) IV, sodium chloride flush   Vital Signs    Vitals:   01/14/19 2000 01/14/19 2048 01/15/19 0016 01/15/19 0354  BP: 122/63 131/70 129/68 (!) 160/82  Pulse: 86 73 (!) 58 68  Resp:  16 18 18   Temp:  98.2 F (36.8 C) 97.6 F (36.4 C) 97.8 F (36.6 C)  TempSrc:  Oral Oral Oral  SpO2:  95% 94% 96%  Weight:      Height:        Intake/Output Summary (Last 24 hours) at 01/15/2019 0734 Last data filed at 01/15/2019 0513 Gross per 24 hour  Intake 1217.11 ml  Output -  Net 1217.11 ml   Last 3 Weights 01/14/2019 01/13/2019 01/12/2019  Weight (lbs) 186 lb 9.6 oz 187 lb 2.7 oz 187 lb 1.3 oz  Weight (kg) 84.641 kg 84.9 kg 84.86 kg  Some encounter information is confidential and restricted. Go to Review Flowsheets activity to see all data.      Telemetry    Sinus in the 60s - Personally Reviewed  ECG   No new tracings - Personally Reviewed  Physical Exam   GEN: No acute distress.   Neck: No JVD Cardiac: RRR, no murmurs, rubs, or gallops.  Respiratory: Clear to auscultation bilaterally. GI: Soft, nontender, non-distended  MS: No edema; No deformity. Neuro:  Nonfocal  Psych: Normal affect   Labs    High Sensitivity Troponin:   Recent Labs  Lab 01/11/19 2313 01/12/19 0212 01/12/19 1051 01/12/19 1908 01/12/19 2111  TROPONINIHS 12 32* 191* 515* 572*      Chemistry Recent Labs  Lab 01/11/19 2313 01/13/19 0342  NA 141 139  K 3.4* 3.4*  CL 107 105  CO2 22 25  GLUCOSE 106* 114*  BUN 10 13  CREATININE 0.75 0.69  CALCIUM 9.6 9.2  GFRNONAA >60 >60  GFRAA >60 >60  ANIONGAP 12 9     Hematology Recent Labs  Lab 01/13/19 0342 01/14/19 0416 01/15/19 0349  WBC 6.8 6.8 6.7  RBC 4.74 4.67 4.76  HGB 13.4 13.2 13.5  HCT 41.8 41.5 42.6  MCV 88.2 88.9 89.5  MCH 28.3 28.3 28.4  MCHC 32.1 31.8 31.7  RDW 12.8 13.0 12.9  PLT 294 310 316    BNPNo results for input(s): BNP, PROBNP in the last 168 hours.   DDimer No results for input(s): DDIMER in the last 168 hours.   Radiology    No results found.  Cardiac Studies   Left heart cath 02/02/18:  Prox RCA lesion is 10% stenosed.  Mid RCA lesion is 65% stenosed.  Dist RCA lesion is 65% stenosed.  Ost 1st Mrg to 1st Mrg lesion is 30% stenosed.  Dist Cx-1 lesion is 90% stenosed.  Dist Cx-2 lesion is 70% stenosed.  Prox Cx to Mid Cx lesion is 20% stenosed.  Ost 3rd Mrg lesion is 40% stenosed.  Dist LM to Ost LAD lesion is 40% stenosed.  Prox LAD to Mid LAD lesion is 50% stenosed.  Ost 1st Diag lesion is 40% stenosed.  Dist LAD lesion is 65% stenosed.   1. The left main is patent 2. The LAD has a mild ostial stenosis then moderate mid and distal disease that does not appear to be flow limiting. The distal LAD stenosis has been present since cath in 2015 and actually appears to be improved.  3. The  Circumflex has a patent mid stented segment with mild restenosis. The distal AV groove circumflex beyond the last obtuse marginal branch has has chronic diffuse disease. This vessel is too small for PCI.  4. The RCA is a moderate to large caliber dominant vessel with a patent proximal stent with mild restenosis. The mid RCA has serial 50-70% stenoses. DFR (pressure wire assessment) of the mid RCA is 0.94 suggesting the stenoses are not flow limiting.  5. Elevated troponin felt to be due to demand ischemia from hypertensive urgency in the setting of moderate CAD  Recommendations: Medical management of CAD. Better BP control. Tobacco cessation. Follow up with Norma Fredrickson, NP or Dr. Garnette Scheuermann. OK to discharge home later today after bedrest is complete.    Echo 01/31/18: Study Conclusions  - Left ventricle: The cavity size was normal. There was moderate   focal basal hypertrophy of the septum with otherwise mild   concentric LVH. Systolic function was normal. The estimated   ejection fraction was in the range of 60% to 65%. Wall motion was   normal; there were no regional wall motion abnormalities. Doppler   parameters are consistent with abnormal left ventricular   relaxation (grade 1 diastolic dysfunction). - Aortic valve: Transvalvular velocity was within the normal range.   There was no stenosis. There was no regurgitation. - Mitral valve: Transvalvular velocity was within the normal range.   There was no evidence for stenosis. There was no regurgitation. - Right ventricle: The cavity size was normal. Wall thickness was   normal. Systolic function was normal. - Atrial septum: No defect or patent foramen ovale was identified   by color flow Doppler. - Tricuspid valve: There was trivial regurgitation. - Pulmonic valve: Transvalvular velocity was within the normal   range. There was no evidence for stenosis. There was no   regurgitation. - Pulmonary arteries: Systolic pressure was  within the normal   range. PA peak pressure: 18 mm Hg (S).  Patient Profile     53 y.o. female female with history of CAD s/p remote stent of RCA and LCx in 2012, ongoing tobacco and Etoh abuse, medical noncompliance presents with chest pain.  Assessment & Plan    1. Chest pain - hs troponin: 12 -->32 --> 191 --> 515 --> 572 - EKG without clear ischemic changes -  continue heparin drip  - coreg 25 mg BID, ASA, imdur, HCTZ, losartan  - given symptoms, known disease, and elevated CE, will plan for cardiac catheterization today - NPO   2. Hypertension - was hypertensive on arrival - medications as above - per patient, pressures elevated at home in the 180-200s - pt not taking statin or losartan at home, per report - may need to transition losartan to irbesartan or valsartan for better control - will continue to monitor   3. Hyperlipidemia with LDL goal < 70 - 01/13/2019: Cholesterol 145; HDL 38; LDL Cholesterol 86; Triglycerides 107; VLDL 21 - continue statin - was not taking at home - if she doesn't tolerate the statin at home, may need [id number]   4. Current smoker 5. Excessive alcohol consumption - she continues to smoke 2 ppd  - drinks 1+ bottles of wine daily - encouraged cessation - CIWA in place     For questions or updates, please contact [facility] Please consult [email or web address] for contact info under        Signed, Angela Nicole Duke, PA  01/15/2019, 7:34 AM     I have personally seen and examined this patient. I agree with the assessment and plan as outlined above.  She is known to have moderately severe, multi-vessel CAD. Last cath in December 2019. She has continued to smoke over the past year. Now admitted with unstable angina, mild elevation troponin.  Plans for cardiac cath today.   I have reviewed the risks, indications, and alternatives to cardiac catheterization, possible angioplasty, and stenting with the patient. Risks include but are not limited  to bleeding, infection, vascular injury, stroke, myocardial infection, arrhythmia, kidney injury, radiation-related injury in the case of prolonged fluoroscopy use, emergency cardiac surgery, and death. The patient understands the risks of serious complication is 1-2 in 1000 with diagnostic cardiac cath and 1-2% or less with angioplasty/stenting.  Sharon Rubis 01/15/2019 9:33 AM  

## 2019-01-15 NOTE — Progress Notes (Addendum)
Progress Note  Patient Name: [name] Date of Encounter: 01/15/2019  Primary Cardiologist: Belva Crome III, MD   Subjective   Pt complaining of chest pain and jaw pain. She had not had her imdur yet this morning.  Inpatient Medications    Scheduled Meds: . aspirin EC  81 mg Oral Daily  . busPIRone  10 mg Oral TID  . carvedilol  25 mg Oral BID WC  . escitalopram  20 mg Oral Daily  . folic acid  1 mg Oral Daily  . gabapentin  300 mg Oral TID  . hydrochlorothiazide  25 mg Oral Daily  . isosorbide mononitrate  60 mg Oral Daily  . losartan  50 mg Oral Daily  . multivitamin with minerals  1 tablet Oral Daily  . nicotine  14 mg Transdermal Daily  . pantoprazole  40 mg Oral BH-q7a  . QUEtiapine  100 mg Oral QHS  . rosuvastatin  20 mg Oral q1800  . sodium chloride flush  3 mL Intravenous Q12H  . thiamine  100 mg Oral Daily   Continuous Infusions: . sodium chloride    . sodium chloride 1 mL/kg/hr (01/15/19 0513)  . heparin 900 Units/hr (01/14/19 0624)   PRN Meds: sodium chloride, acetaminophen, albuterol, LORazepam **OR** LORazepam, nitroGLYCERIN, ondansetron (ZOFRAN) IV, sodium chloride flush   Vital Signs    Vitals:   01/14/19 2000 01/14/19 2048 01/15/19 0016 01/15/19 0354  BP: 122/63 131/70 129/68 (!) 160/82  Pulse: 86 73 (!) 58 68  Resp:  16 18 18   Temp:  98.2 F (36.8 C) 97.6 F (36.4 C) 97.8 F (36.6 C)  TempSrc:  Oral Oral Oral  SpO2:  95% 94% 96%  Weight:      Height:        Intake/Output Summary (Last 24 hours) at 01/15/2019 0734 Last data filed at 01/15/2019 0513 Gross per 24 hour  Intake 1217.11 ml  Output -  Net 1217.11 ml   Last 3 Weights 01/14/2019 01/13/2019 01/12/2019  Weight (lbs) 186 lb 9.6 oz 187 lb 2.7 oz 187 lb 1.3 oz  Weight (kg) 84.641 kg 84.9 kg 84.86 kg  Some encounter information is confidential and restricted. Go to Review Flowsheets activity to see all data.      Telemetry    Sinus in the 60s - Personally Reviewed  ECG   No new tracings - Personally Reviewed  Physical Exam   GEN: No acute distress.   Neck: No JVD Cardiac: RRR, no murmurs, rubs, or gallops.  Respiratory: Clear to auscultation bilaterally. GI: Soft, nontender, non-distended  MS: No edema; No deformity. Neuro:  Nonfocal  Psych: Normal affect   Labs    High Sensitivity Troponin:   Recent Labs  Lab 01/11/19 2313 01/12/19 0212 01/12/19 1051 01/12/19 1908 01/12/19 2111  TROPONINIHS 12 32* 191* 515* 572*      Chemistry Recent Labs  Lab 01/11/19 2313 01/13/19 0342  NA 141 139  K 3.4* 3.4*  CL 107 105  CO2 22 25  GLUCOSE 106* 114*  BUN 10 13  CREATININE 0.75 0.69  CALCIUM 9.6 9.2  GFRNONAA >60 >60  GFRAA >60 >60  ANIONGAP 12 9     Hematology Recent Labs  Lab 01/13/19 0342 01/14/19 0416 01/15/19 0349  WBC 6.8 6.8 6.7  RBC 4.74 4.67 4.76  HGB 13.4 13.2 13.5  HCT 41.8 41.5 42.6  MCV 88.2 88.9 89.5  MCH 28.3 28.3 28.4  MCHC 32.1 31.8 31.7  RDW 12.8 13.0 12.9  PLT 294 310 316    BNPNo results for input(s): BNP, PROBNP in the last 168 hours.   DDimer No results for input(s): DDIMER in the last 168 hours.   Radiology    No results found.  Cardiac Studies   Left heart cath 02/02/18:  Prox RCA lesion is 10% stenosed.  Mid RCA lesion is 65% stenosed.  Dist RCA lesion is 65% stenosed.  Ost 1st Mrg to 1st Mrg lesion is 30% stenosed.  Dist Cx-1 lesion is 90% stenosed.  Dist Cx-2 lesion is 70% stenosed.  Prox Cx to Mid Cx lesion is 20% stenosed.  Ost 3rd Mrg lesion is 40% stenosed.  Dist LM to Ost LAD lesion is 40% stenosed.  Prox LAD to Mid LAD lesion is 50% stenosed.  Ost 1st Diag lesion is 40% stenosed.  Dist LAD lesion is 65% stenosed.   1. The left main is patent 2. The LAD has a mild ostial stenosis then moderate mid and distal disease that does not appear to be flow limiting. The distal LAD stenosis has been present since cath in 2015 and actually appears to be improved.  3. The  Circumflex has a patent mid stented segment with mild restenosis. The distal AV groove circumflex beyond the last obtuse marginal branch has has chronic diffuse disease. This vessel is too small for PCI.  4. The RCA is a moderate to large caliber dominant vessel with a patent proximal stent with mild restenosis. The mid RCA has serial 50-70% stenoses. DFR (pressure wire assessment) of the mid RCA is 0.94 suggesting the stenoses are not flow limiting.  5. Elevated troponin felt to be due to demand ischemia from hypertensive urgency in the setting of moderate CAD  Recommendations: Medical management of CAD. Better BP control. Tobacco cessation. Follow up with Norma Fredrickson, NP or Dr. Garnette Scheuermann. OK to discharge home later today after bedrest is complete.    Echo 01/31/18: Study Conclusions  - Left ventricle: The cavity size was normal. There was moderate   focal basal hypertrophy of the septum with otherwise mild   concentric LVH. Systolic function was normal. The estimated   ejection fraction was in the range of 60% to 65%. Wall motion was   normal; there were no regional wall motion abnormalities. Doppler   parameters are consistent with abnormal left ventricular   relaxation (grade 1 diastolic dysfunction). - Aortic valve: Transvalvular velocity was within the normal range.   There was no stenosis. There was no regurgitation. - Mitral valve: Transvalvular velocity was within the normal range.   There was no evidence for stenosis. There was no regurgitation. - Right ventricle: The cavity size was normal. Wall thickness was   normal. Systolic function was normal. - Atrial septum: No defect or patent foramen ovale was identified   by color flow Doppler. - Tricuspid valve: There was trivial regurgitation. - Pulmonic valve: Transvalvular velocity was within the normal   range. There was no evidence for stenosis. There was no   regurgitation. - Pulmonary arteries: Systolic pressure was  within the normal   range. PA peak pressure: 18 mm Hg (S).  Patient Profile     53 y.o. female female with history of CAD s/p remote stent of RCA and LCx in 2012, ongoing tobacco and Etoh abuse, medical noncompliance presents with chest pain.  Assessment & Plan    1. Chest pain - hs troponin: 12 -->32 --> 191 --> 515 --> 572 - EKG without clear ischemic changes -  continue heparin drip  - coreg 25 mg BID, ASA, imdur, HCTZ, losartan  - given symptoms, known disease, and elevated CE, will plan for cardiac catheterization today - NPO   2. Hypertension - was hypertensive on arrival - medications as above - per patient, pressures elevated at home in the 180-200s - pt not taking statin or losartan at home, per report - may need to transition losartan to irbesartan or valsartan for better control - will continue to monitor   3. Hyperlipidemia with LDL goal < 70 - 01/13/2019: Cholesterol 145; HDL 38; LDL Cholesterol 86; Triglycerides 107; VLDL 21 - continue statin - was not taking at home - if she doesn't tolerate the statin at home, may need PCSK9i   4. Current smoker 5. Excessive alcohol consumption - she continues to smoke 2 ppd  - drinks 1+ bottles of wine daily - encouraged cessation - CIWA in place     For questions or updates, please contact [facility] Please consult [email or web address] for contact info under        Signed, Marcelino Dusterngela Nicole Duke, PA  01/15/2019, 7:34 AM     I have personally seen and examined this patient. I agree with the assessment and plan as outlined above.  She is known to have moderately severe, multi-vessel CAD. Last cath in December 2019. She has continued to smoke over the past year. Now admitted with unstable angina, mild elevation troponin.  Plans for cardiac cath today.   I have reviewed the risks, indications, and alternatives to cardiac catheterization, possible angioplasty, and stenting with the patient. Risks include but are not limited  to bleeding, infection, vascular injury, stroke, myocardial infection, arrhythmia, kidney injury, radiation-related injury in the case of prolonged fluoroscopy use, emergency cardiac surgery, and death. The patient understands the risks of serious complication is 1-2 in 1000 with diagnostic cardiac cath and 1-2% or less with angioplasty/stenting.  Verne CarrowChristopher Nizhoni Parlow 01/15/2019 9:33 AM

## 2019-01-16 ENCOUNTER — Inpatient Hospital Stay (HOSPITAL_COMMUNITY): Payer: Self-pay

## 2019-01-16 DIAGNOSIS — I214 Non-ST elevation (NSTEMI) myocardial infarction: Secondary | ICD-10-CM

## 2019-01-16 DIAGNOSIS — I25110 Atherosclerotic heart disease of native coronary artery with unstable angina pectoris: Secondary | ICD-10-CM

## 2019-01-16 DIAGNOSIS — I5032 Chronic diastolic (congestive) heart failure: Secondary | ICD-10-CM

## 2019-01-16 DIAGNOSIS — E669 Obesity, unspecified: Secondary | ICD-10-CM

## 2019-01-16 DIAGNOSIS — Z01810 Encounter for preprocedural cardiovascular examination: Secondary | ICD-10-CM

## 2019-01-16 LAB — ECHOCARDIOGRAM COMPLETE
Height: 61 in
Weight: 3057.6 oz

## 2019-01-16 LAB — HEPARIN LEVEL (UNFRACTIONATED)
Heparin Unfractionated: 0.11 IU/mL — ABNORMAL LOW (ref 0.30–0.70)
Heparin Unfractionated: 0.26 IU/mL — ABNORMAL LOW (ref 0.30–0.70)

## 2019-01-16 LAB — BASIC METABOLIC PANEL
Anion gap: 10 (ref 5–15)
BUN: 12 mg/dL (ref 6–20)
CO2: 24 mmol/L (ref 22–32)
Calcium: 8.8 mg/dL — ABNORMAL LOW (ref 8.9–10.3)
Chloride: 107 mmol/L (ref 98–111)
Creatinine, Ser: 0.8 mg/dL (ref 0.44–1.00)
GFR calc Af Amer: >60 mL/min (ref >60)
GFR calc non Af Amer: >60 mL/min (ref >60)
Glucose, Bld: 128 mg/dL — ABNORMAL HIGH (ref 70–99)
Potassium: 3.7 mmol/L (ref 3.5–5.1)
Sodium: 141 mmol/L (ref 135–145)

## 2019-01-16 LAB — CBC
HCT: 41.7 % (ref 36.0–46.0)
Hemoglobin: 13.2 g/dL (ref 12.0–15.0)
MCH: 28.6 pg (ref 26.0–34.0)
MCHC: 31.7 g/dL (ref 30.0–36.0)
MCV: 90.3 fL (ref 80.0–100.0)
Platelets: 283 10*3/uL (ref 150–400)
RBC: 4.62 MIL/uL (ref 3.87–5.11)
RDW: 13.2 % (ref 11.5–15.5)
WBC: 8.6 10*3/uL (ref 4.0–10.5)
nRBC: 0 % (ref 0.0–0.2)

## 2019-01-16 MED ORDER — ~~LOC~~ CARDIAC SURGERY, PATIENT & FAMILY EDUCATION
Freq: Once | Status: AC
  Administered 2019-01-16: 19:00:00
  Filled 2019-01-16 (×2): qty 1

## 2019-01-16 NOTE — Progress Notes (Signed)
ANTICOAGULATION CONSULT NOTE  Pharmacy Consult for Heparin Indication: chest pain/ACS  Patient Measurements: Height: 5\' 1"  (154.9 cm) Weight: 191 lb 1.6 oz (86.7 kg) IBW/kg (Calculated) : 47.8 Heparin Dosing Weight: 67.3 kg  Vital Signs: Temp: 99.1 F (37.3 C) (12/08 0828) Temp Source: Oral (12/08 0828) BP: 151/82 (12/08 0828) Pulse Rate: 83 (12/08 0828)  Labs: Recent Labs    01/14/19 0416 01/15/19 0349 01/16/19 0752  HGB 13.2 13.5 13.2  HCT 41.5 42.6 41.7  PLT 310 316 283  HEPARINUNFRC 0.54 0.39 0.11*  CREATININE  --   --  0.80    Estimated Creatinine Clearance: 82.3 mL/min (by C-G formula based on SCr of 0.8 mg/dL).   . sodium chloride    . sodium chloride 75 mL/hr at 01/16/19 0037  . sodium chloride    . heparin 900 Units/hr (01/16/19 0030)   Assessment: 53 YOF s/p cath and found to have severe CAD. Pharmacy consulted to resume Heparin 8 hours s/p sheath removal.  Heparin level this morning is below goal.  Spoke with RN, no known issues with IV infusion.  No overt bleeding or complications noted.  CBC stable.  Goal of Therapy:  Heparin level 0.3-0.7 units/ml Monitor platelets by anticoagulation protocol: Yes   Plan:  - Increase IV heparin to 1050 units/hr. -Recheck heparin level in 6 hrs. -Daily heparin level and CBC. -F/u plans for CABG.  Thank you for allowing pharmacy to be a part of this patient's care.  Marguerite Olea, [facility] Clinical Pharmacist Phone [phone]  01/16/2019 10:03 AM

## 2019-01-16 NOTE — Progress Notes (Signed)
Progress Note  Patient Name: [name] Date of Encounter: 01/16/2019  Primary Cardiologist: Lesleigh Noe, MD   Subjective   Complains of mild chest burning this morning. Overall feeling better. No shortness of breath.  Inpatient Medications    Scheduled Meds:  aspirin  81 mg Oral Daily   aspirin EC  81 mg Oral Daily   busPIRone  10 mg Oral TID   carvedilol  25 mg Oral BID WC   escitalopram  20 mg Oral Daily   folic acid  1 mg Oral Daily   gabapentin  300 mg Oral TID   hydrochlorothiazide  25 mg Oral Daily   isosorbide mononitrate  60 mg Oral Daily   losartan  50 mg Oral Daily   multivitamin with minerals  1 tablet Oral Daily   nicotine  14 mg Transdermal Daily   pantoprazole  40 mg Oral BH-q7a   QUEtiapine  100 mg Oral QHS   rosuvastatin  20 mg Oral q1800   rosuvastatin  40 mg Oral q1800   sodium chloride flush  3 mL Intravenous Q12H   sodium chloride flush  3 mL Intravenous Q12H   thiamine  100 mg Oral Daily   Continuous Infusions:  sodium chloride     sodium chloride 75 mL/hr at 01/16/19 0037   sodium chloride     heparin 900 Units/hr (01/16/19 0030)   PRN Meds: sodium chloride, sodium chloride, acetaminophen, acetaminophen, albuterol, ALPRAZolam, diazepam, nitroGLYCERIN, ondansetron (ZOFRAN) IV, sodium chloride flush, sodium chloride flush   Vital Signs    Vitals:   01/15/19 2154 01/15/19 2255 01/15/19 2354 01/16/19 0630  BP: (!) 146/68 122/78 121/74 (!) 156/83  Pulse: 76 70 71   Resp:      Temp:    98.3 F (36.8 C)  TempSrc:    Oral  SpO2: 99% 98% 96%   Weight:    86.7 kg  Height:        Intake/Output Summary (Last 24 hours) at 01/16/2019 0817 Last data filed at 01/16/2019 0630 Gross per 24 hour  Intake 1617.32 ml  Output 850 ml  Net 767.32 ml   Last 3 Weights 01/16/2019 01/14/2019 01/13/2019  Weight (lbs) 191 lb 1.6 oz 186 lb 9.6 oz 187 lb 2.7 oz  Weight (kg) 86.682 kg 84.641 kg 84.9 kg  Some encounter information  is confidential and restricted. Go to Review Flowsheets activity to see all data.      Telemetry    Sinus rhythm - Personally Reviewed  ECG    01/13/2019: Sinus bradycardia with LVH, no significant ST/T wave changes - Personally Reviewed  Physical Exam  Alert, oriented woman in no distress GEN: No acute distress.   Neck: No JVD Cardiac: RRR, 2/6 systolic ejection murmur at the right upper sternal border Respiratory: Clear to auscultation bilaterally. GI: Soft, nontender, non-distended  MS: No edema; No deformity. Right radial site clear Neuro:  Nonfocal  Psych: Normal affect   Labs    High Sensitivity Troponin:   Recent Labs  Lab 01/11/19 2313 01/12/19 0212 01/12/19 1051 01/12/19 1908 01/12/19 2111  TROPONINIHS 12 32* 191* 515* 572*      Chemistry Recent Labs  Lab 01/11/19 2313 01/13/19 0342 01/15/19 1134  NA 141 139  --   K 3.4* 3.4* 3.8  CL 107 105  --   CO2 22 25  --   GLUCOSE 106* 114*  --   BUN 10 13  --   CREATININE 0.75 0.69  --  CALCIUM 9.6 9.2  --   GFRNONAA >60 >60  --   GFRAA >60 >60  --   ANIONGAP 12 9  --      Hematology Recent Labs  Lab 01/14/19 0416 01/15/19 0349 01/16/19 0752  WBC 6.8 6.7 8.6  RBC 4.67 4.76 4.62  HGB 13.2 13.5 13.2  HCT 41.5 42.6 41.7  MCV 88.9 89.5 90.3  MCH 28.3 28.4 28.6  MCHC 31.8 31.7 31.7  RDW 13.0 12.9 13.2  PLT 310 316 283    BNPNo results for input(s): BNP, PROBNP in the last 168 hours.   DDimer No results for input(s): DDIMER in the last 168 hours.   Radiology    No results found.  Cardiac Studies   Cardiac catheterization 01/15/2019: Conclusion    Ost RCA to Prox RCA lesion is 5% stenosed.  RPDA lesion is 95% stenosed.  Dist RCA lesion is 80% stenosed.  Prox RCA lesion is 70% stenosed.  Mid RCA-1 lesion is 80% stenosed.  Mid RCA-2 lesion is 90% stenosed.  Mid RCA to Dist RCA lesion is 40% stenosed.  1st Mrg lesion is 50% stenosed.  3rd Mrg lesion is 80% stenosed.  Prox  LAD lesion is 50% stenosed.  Mid LAD lesion is 50% stenosed.  1st Diag lesion is 80% stenosed.  Dist LAD lesion is 70% stenosed.  Mid Cx to Dist Cx lesion is 90% stenosed.  The left ventricular systolic function is normal.  LV end diastolic pressure is normal.   Significant progressive multivessel CAD with 50% proximal LAD stenosis followed by proximal small to mid stenosis of 50% with diffuse 80% stenosis in the first diagonal vessel and 70% distal LAD stenosis; left circumflex with 50% stenosis in a large OM1 vessel, and with focal distal restenosis in the AV groove stent which enters into the marginal branch that has 90% focal narrowing in the region of the distal small AV groove circumflex with 80% stenosis; and large dominant RCA with patent proximal RCA stent but with progressive mid distal disease with bifurcation stenosis in the distal RCA extending into the large PDA vessel.  Preserved global LV contractility with EF estimate at 60%; LVEDP 13 mm.  RECOMMENDATION: Will ask colleagues to review.  With progressive disease particularly in the RCA as well as progressive concomitant CAD involving the LAD and circumflex consider surgical consultation for  surgical revascularization.  We will hold off on P2Y12 inhibition pending possible surgical evaluation.  Alternatively, multi-vesssel intervention to the LCx in-stent restenosis and RCA can be considered.  Aggressive lipid-lowering therapy with target LDL in the 50s or below.  Smoking cessation is imperative.   Diagnostic Dominance: Right Left Anterior Descending  Prox LAD lesion 50% stenosed  Prox LAD lesion is 50% stenosed.  Mid LAD lesion 50% stenosed  Mid LAD lesion is 50% stenosed.  Dist LAD lesion 70% stenosed  Dist LAD lesion is 70% stenosed.  First Diagonal Branch  1st Diag lesion 80% stenosed  1st Diag lesion is 80% stenosed.  Left Circumflex  Mid Cx to Dist Cx lesion 90% stenosed  Mid Cx to Dist Cx lesion is 90%  stenosed. The lesion was previously treated.  First Obtuse Marginal Branch  1st Mrg lesion 50% stenosed  1st Mrg lesion is 50% stenosed.  Third Obtuse Marginal Branch  3rd Mrg lesion 80% stenosed  3rd Mrg lesion is 80% stenosed.  Right Coronary Artery  Ost RCA to Prox RCA lesion 5% stenosed  Ost RCA to Prox RCA lesion is  5% stenosed. The lesion was previously treated.  Prox RCA lesion 70% stenosed  Prox RCA lesion is 70% stenosed.  Mid RCA-1 lesion 80% stenosed  Mid RCA-1 lesion is 80% stenosed.  Mid RCA-2 lesion 90% stenosed  Mid RCA-2 lesion is 90% stenosed.  Mid RCA to Dist RCA lesion 40% stenosed  Mid RCA to Dist RCA lesion is 40% stenosed.  Dist RCA lesion 80% stenosed  Dist RCA lesion is 80% stenosed.  Right Posterior Descending Artery  RPDA lesion 95% stenosed  RPDA lesion is 95% stenosed.  Intervention  No interventions have been documented. Wall Motion  Resting                  Patient Profile     53 y.o. female with known CAD and history of RCA stenting and left circumflex stenting in 2012, presenting with non-STEMI.  Patient with history of alcohol and tobacco abuse as well as medical noncompliance.  Assessment & Plan    1.  Non-STEMI: Cardiac catheterization demonstrates multivessel coronary artery disease with recommendations for revascularization with either PCI or consideration of CABG for more complete revascularization.  She is currently on IV heparin, aspirin, a beta-blocker, long-acting nitrate, and a high intensity statin drug.  I have personally reviewed her cardiac catheterization films.  She has progressive disease including moderate stenosis at the ostium of the LAD, severe stenosis in the proximal to mid LAD, severe stenosis in the mid to distal circumflex involving a small obtuse marginal branch, and severe diffuse stenosis in the distal RCA and the PLA branches.  PCI could be offered but would be limited to treating the severe stenosis in the LAD  and distal RCA.  I think the branch vessel disease in the distal circumflex/OM and right PLA branches could be treated with angioplasty alone but they are small vessels and probably not suitable for stenting.  Will obtain a cardiac surgical consultation for consideration of multivessel CABG.  I suspect her LAD is graftable, but the distal RCA branches and the second OM look pretty marginal for grafting.  Appreciate cardiac surgical consultation in advance. 2.  Mixed hyperlipidemia: Patient has not been on medical therapy due to noncompliance.  Currently receiving high intensity statin with Crestor 20 mg daily 3.  Hypertension: Blood pressure controlled on combination of an ARB, long-acting nitrate, hydrochlorothiazide, and carvedilol. 4.  Tobacco abuse: Heavy smoker.  Cessation counseling done. 5.  Alcohol abuse: Cessation counseling done  Disposition: Will obtain cardiac surgical consultation to review whether CABG may be advantageous over PCI.  The patient has normal LV function and is nondiabetic, but she does have diffuse multivessel coronary disease.  For questions or updates, please contact [facility] Please consult [email or web address] for contact info under     Signed, Sherren Mocha, MD  01/16/2019, 8:17 AM

## 2019-01-16 NOTE — Plan of Care (Signed)
  Problem: Education: Goal: Understanding of CV disease, CV risk reduction, and recovery process will improve Outcome: Progressing   Problem: Activity: Goal: Ability to return to baseline activity level will improve Outcome: Progressing   Problem: Cardiovascular: Goal: Ability to achieve and maintain adequate cardiovascular perfusion will improve Outcome: Progressing   

## 2019-01-16 NOTE — Progress Notes (Signed)
Echocardiogram 2D Echocardiogram has been performed.  Oneal Deputy Reford Olliff 01/16/2019, 1:59 PM

## 2019-01-16 NOTE — Progress Notes (Signed)
Pre CABG vascular testing complete. Please see CV Proc tab for carotid preliminary results.  Pre-op Cardiac Surgery  Upper Extremity Right Left  Brachial Pressures 173, Tri 171, Tri  Radial Waveforms Tri Tri  Ulnar Waveforms Tri Tri  Palmar Arch (Allen's Test) Radial wave is unchanged with compression and decreases with ulnar compression. Radial wave is unchanged with compression and obliterates with ulnar compression.   Lower  Extremity Right Left  Dorsalis Pedis 173 171  Posterior Tibial 179 171  Ankle/Brachial Indices 1.0 0.99   Findings:  Bilateral ABI's within normal limits.  [place], RVT 6:00 PM  01/16/2019

## 2019-01-16 NOTE — Progress Notes (Signed)
Nutrition Education Note  RD consulted for nutrition education per patient request.  RD working remotely. Unable to reach pt by phone. Will attempt at a later date.   Lipid Panel     Component Value Date/Time   CHOL 145 01/13/2019 0342   CHOL 193 02/11/2016 1057   TRIG 107 01/13/2019 0342   HDL 38 (L) 01/13/2019 0342   HDL 51 02/11/2016 1057   CHOLHDL 3.8 01/13/2019 0342   VLDL 21 01/13/2019 0342   LDLCALC 86 01/13/2019 0342   LDLCALC 94 02/11/2016 1057    RD will provide "Heart Healthy Nutrition Therapy" handout in discharge instructions. Provides examples on ways to decrease sodium and fat intake in diet. Discourages intake of processed foods, use of salt shaker and alcohol consumption. Encourages fresh fruits and vegetables as well as whole grain sources of carbohydrates to maximize fiber intake.   Body mass index is 36.11 kg/m. Pt meets criteria for obesity based on current BMI.  Current diet order is heart healthy, patient is consuming approximately 100% of meals at this time. Labs and medications reviewed. No further nutrition interventions warranted at this time. If additional nutrition issues arise, please re-consult RD.  Clayton Bibles, MS, RD, LDN Inpatient Clinical Dietitian Pager: [phone] After Hours Pager: [phone]
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
## 2019-01-16 NOTE — Progress Notes (Signed)
ANTICOAGULATION CONSULT NOTE  Pharmacy Consult for Heparin Indication: chest pain/ACS  Patient Measurements: Height: 5\' 1"  (154.9 cm) Weight: 191 lb 1.6 oz (86.7 kg) IBW/kg (Calculated) : 47.8 Heparin Dosing Weight: 67.3 kg  Vital Signs: Temp: 98.2 F (36.8 C) (12/08 1559) Temp Source: Oral (12/08 1559) BP: 130/73 (12/08 1559) Pulse Rate: 90 (12/08 1559)  Labs: Recent Labs    01/14/19 0416 01/15/19 0349 01/16/19 0752 01/16/19 1543  HGB 13.2 13.5 13.2  --   HCT 41.5 42.6 41.7  --   PLT 310 316 283  --   HEPARINUNFRC 0.54 0.39 0.11* 0.26*  CREATININE  --   --  0.80  --     Estimated Creatinine Clearance: 82.3 mL/min (by C-G formula based on SCr of 0.8 mg/dL).   . sodium chloride    . sodium chloride 75 mL/hr at 01/16/19 0037  . sodium chloride    . heparin 1,050 Units/hr (01/16/19 1008)   Assessment: [name] s/p cath and found to have severe CAD. Pharmacy consulted to resume Heparin 8 hours s/p sheath removal.  Heparin level this evening remains SUBtherapeutic despite a rate increase earlier today (HL 0.26 << 0.11, goal of 0.3-0.7). The RN did state that the IV site was switched ~3 hours prior to the level being drawn from the [facility] to an alternative site but reported minimal pauses prior to then. Will still increase slightly and recheck the level in 6 hours.   Goal of Therapy:  Heparin level 0.3-0.7 units/ml Monitor platelets by anticoagulation protocol: Yes   Plan:  - Increase Heparin to 1100 units/hr (11 ml/hr) - Will continue to monitor for any signs/symptoms of bleeding and will follow up with heparin level in 6 hours  -F/u plans for CABG.  Thank you for allowing pharmacy to be a part of this patient's care.  Alycia Rossetti, PharmD, BCPS Clinical Pharmacist Clinical phone for 01/16/2019: [phone] 01/16/2019 5:42 PM   **Pharmacist phone directory can now be found on [email or web address] ([email or web address]).  Listed under [place].

## 2019-01-16 NOTE — Progress Notes (Signed)
TR BAND REMOVAL  LOCATION:    right radial  DEFLATED PER PROTOCOL:    Yes.    TIME BAND OFF / DRESSING APPLIED:    2054   SITE UPON ARRIVAL:    Level 0  SITE AFTER BAND REMOVAL:    Level 0  CIRCULATION SENSATION AND MOVEMENT:    Within Normal Limits   Yes.    COMMENTS:   Pt.tolerated procedure well ;no bleeding or hematoma noted around the site.

## 2019-01-17 LAB — URINALYSIS, ROUTINE W REFLEX MICROSCOPIC
Bilirubin Urine: NEGATIVE
Glucose, UA: NEGATIVE mg/dL
Ketones, ur: NEGATIVE mg/dL
Nitrite: NEGATIVE
Protein, ur: NEGATIVE mg/dL
Specific Gravity, Urine: 1.008 (ref 1.005–1.030)
WBC, UA: >50 WBC/hpf — ABNORMAL HIGH (ref 0–5)
pH: 6 (ref 5.0–8.0)

## 2019-01-17 LAB — CBC
HCT: 38 % (ref 36.0–46.0)
Hemoglobin: 12.2 g/dL (ref 12.0–15.0)
MCH: 28.4 pg (ref 26.0–34.0)
MCHC: 32.1 g/dL (ref 30.0–36.0)
MCV: 88.4 fL (ref 80.0–100.0)
Platelets: 280 10*3/uL (ref 150–400)
RBC: 4.3 MIL/uL (ref 3.87–5.11)
RDW: 13.1 % (ref 11.5–15.5)
WBC: 13 10*3/uL — ABNORMAL HIGH (ref 4.0–10.5)
nRBC: 0 % (ref 0.0–0.2)

## 2019-01-17 LAB — HEPARIN LEVEL (UNFRACTIONATED)
Heparin Unfractionated: 0.36 IU/mL (ref 0.30–0.70)
Heparin Unfractionated: 0.44 IU/mL (ref 0.30–0.70)

## 2019-01-17 LAB — BLOOD GAS, ARTERIAL
Acid-Base Excess: 0.8 mmol/L (ref 0.0–2.0)
Bicarbonate: 25 mmol/L (ref 20.0–28.0)
FIO2: 21
O2 Saturation: 96.5 %
Patient temperature: 37
pCO2 arterial: 40.8 mmHg (ref 32.0–48.0)
pH, Arterial: 7.404 (ref 7.350–7.450)
pO2, Arterial: 84.2 mmHg (ref 83.0–108.0)

## 2019-01-17 LAB — COMPREHENSIVE METABOLIC PANEL
ALT: 49 U/L — ABNORMAL HIGH (ref 0–44)
AST: 54 U/L — ABNORMAL HIGH (ref 15–41)
Albumin: 3.1 g/dL — ABNORMAL LOW (ref 3.5–5.0)
Alkaline Phosphatase: 61 U/L (ref 38–126)
Anion gap: 10 (ref 5–15)
BUN: 10 mg/dL (ref 6–20)
CO2: 25 mmol/L (ref 22–32)
Calcium: 9.1 mg/dL (ref 8.9–10.3)
Chloride: 106 mmol/L (ref 98–111)
Creatinine, Ser: 0.87 mg/dL (ref 0.44–1.00)
GFR calc Af Amer: >60 mL/min (ref >60)
GFR calc non Af Amer: >60 mL/min (ref >60)
Glucose, Bld: 124 mg/dL — ABNORMAL HIGH (ref 70–99)
Potassium: 3.5 mmol/L (ref 3.5–5.1)
Sodium: 141 mmol/L (ref 135–145)
Total Bilirubin: 0.1 mg/dL — ABNORMAL LOW (ref 0.3–1.2)
Total Protein: 6.1 g/dL — ABNORMAL LOW (ref 6.5–8.1)

## 2019-01-17 LAB — PREPARE RBC (CROSSMATCH)

## 2019-01-17 LAB — APTT: aPTT: 93 seconds — ABNORMAL HIGH (ref 24–36)

## 2019-01-17 LAB — SURGICAL PCR SCREEN
MRSA, PCR: NEGATIVE
Staphylococcus aureus: NEGATIVE

## 2019-01-17 LAB — HEMOGLOBIN A1C
Hgb A1c MFr Bld: 6.3 % — ABNORMAL HIGH (ref 4.8–5.6)
Mean Plasma Glucose: 134.11 mg/dL

## 2019-01-17 LAB — PROTIME-INR
INR: 1 (ref 0.8–1.2)
Prothrombin Time: 13.4 seconds (ref 11.4–15.2)

## 2019-01-17 MED ORDER — TEMAZEPAM 15 MG PO CAPS: 15.0000 mg | ORAL_CAPSULE | Freq: Once | ORAL | Status: DC | PRN

## 2019-01-17 MED ORDER — SODIUM CHLORIDE 0.9 % IV SOLN
750.0000 mg | INTRAVENOUS | Status: AC
  Administered 2019-01-18: 750 mg via INTRAVENOUS
  Filled 2019-01-17: qty 750

## 2019-01-17 MED ORDER — CHLORHEXIDINE GLUCONATE CLOTH 2 % EX PADS
6.0000 | MEDICATED_PAD | Freq: Once | CUTANEOUS | Status: AC
  Administered 2019-01-17: 6 via TOPICAL

## 2019-01-17 MED ORDER — POTASSIUM CHLORIDE 2 MEQ/ML IV SOLN
80.0000 meq | INTRAVENOUS | Status: DC
  Filled 2019-01-17: qty 40

## 2019-01-17 MED ORDER — TRANEXAMIC ACID (OHS) BOLUS VIA INFUSION
15.0000 mg/kg | INTRAVENOUS | Status: AC
  Administered 2019-01-18: 1309.5 mg via INTRAVENOUS
  Filled 2019-01-17: qty 1310

## 2019-01-17 MED ORDER — METOPROLOL TARTRATE 12.5 MG HALF TABLET
12.5000 mg | ORAL_TABLET | Freq: Once | ORAL | Status: AC
  Administered 2019-01-18: 12.5 mg via ORAL
  Filled 2019-01-17: qty 1

## 2019-01-17 MED ORDER — SODIUM CHLORIDE 0.9 % IV SOLN
INTRAVENOUS | Status: DC
  Filled 2019-01-17: qty 30

## 2019-01-17 MED ORDER — PHENYLEPHRINE HCL-NACL 20-0.9 MG/250ML-% IV SOLN
30.0000 ug/min | INTRAVENOUS | Status: AC
  Administered 2019-01-18: 25 ug/min via INTRAVENOUS
  Filled 2019-01-17: qty 250

## 2019-01-17 MED ORDER — PLASMA-LYTE 148 IV SOLN
INTRAVENOUS | Status: DC
  Filled 2019-01-17: qty 2.5

## 2019-01-17 MED ORDER — SODIUM CHLORIDE 0.9 % IV SOLN
1.5000 g | INTRAVENOUS | Status: AC
  Administered 2019-01-18: 1.5 g via INTRAVENOUS
  Filled 2019-01-17: qty 1.5

## 2019-01-17 MED ORDER — INSULIN REGULAR(HUMAN) IN NACL 100-0.9 UT/100ML-% IV SOLN
INTRAVENOUS | Status: AC
  Administered 2019-01-18: .9 [IU]/h via INTRAVENOUS
  Filled 2019-01-17: qty 100

## 2019-01-17 MED ORDER — CHLORHEXIDINE GLUCONATE 0.12 % MT SOLN
15.0000 mL | Freq: Once | OROMUCOSAL | Status: AC
  Administered 2019-01-18: 15 mL via OROMUCOSAL
  Filled 2019-01-17: qty 15

## 2019-01-17 MED ORDER — MANNITOL 20 % IV SOLN
INTRAVENOUS | Status: DC
  Filled 2019-01-17: qty 13

## 2019-01-17 MED ORDER — CHLORHEXIDINE GLUCONATE CLOTH 2 % EX PADS
6.0000 | MEDICATED_PAD | Freq: Once | CUTANEOUS | Status: AC
  Administered 2019-01-17: 23:00:00 6 via TOPICAL

## 2019-01-17 MED ORDER — TRANEXAMIC ACID 1000 MG/10ML IV SOLN
1.5000 mg/kg/h | INTRAVENOUS | Status: AC
  Administered 2019-01-18: 1.5 mg/kg/h via INTRAVENOUS
  Filled 2019-01-17: qty 25

## 2019-01-17 MED ORDER — DEXMEDETOMIDINE HCL IN NACL 400 MCG/100ML IV SOLN
0.1000 ug/kg/h | INTRAVENOUS | Status: AC
  Administered 2019-01-18: .5 ug/kg/h via INTRAVENOUS
  Filled 2019-01-17: qty 100

## 2019-01-17 MED ORDER — TRANEXAMIC ACID (OHS) PUMP PRIME SOLUTION
2.0000 mg/kg | INTRAVENOUS | Status: DC
  Filled 2019-01-17: qty 1.75

## 2019-01-17 MED ORDER — NOREPINEPHRINE 4 MG/250ML-% IV SOLN
0.0000 ug/min | INTRAVENOUS | Status: DC
  Filled 2019-01-17: qty 250

## 2019-01-17 MED ORDER — VANCOMYCIN HCL 10 G IV SOLR
1500.0000 mg | INTRAVENOUS | Status: AC
  Administered 2019-01-18: 1500 mg via INTRAVENOUS
  Filled 2019-01-17: qty 1500

## 2019-01-17 MED ORDER — NITROGLYCERIN IN D5W 200-5 MCG/ML-% IV SOLN
2.0000 ug/min | INTRAVENOUS | Status: AC
  Administered 2019-01-18: 16.6 ug/min via INTRAVENOUS
  Filled 2019-01-17: qty 250

## 2019-01-17 MED ORDER — MILRINONE LACTATE IN DEXTROSE 20-5 MG/100ML-% IV SOLN
0.3000 ug/kg/min | INTRAVENOUS | Status: DC
  Filled 2019-01-17: qty 100

## 2019-01-17 MED ORDER — BISACODYL 5 MG PO TBEC
5.0000 mg | DELAYED_RELEASE_TABLET | Freq: Once | ORAL | Status: AC
  Administered 2019-01-17: 5 mg via ORAL
  Filled 2019-01-17: qty 1

## 2019-01-17 MED ORDER — EPINEPHRINE HCL 5 MG/250ML IV SOLN IN NS
0.0000 ug/min | INTRAVENOUS | Status: DC
  Filled 2019-01-17: qty 250

## 2019-01-17 NOTE — Progress Notes (Addendum)
Progress Note  Patient Name: [name] Date of Encounter: 01/17/2019  Primary Cardiologist: Belva Crome III, MD   Subjective   No chest pain overnight. Anxious and wants to the know the plan for surgery.   Inpatient Medications    Scheduled Meds:  aspirin EC  81 mg Oral Daily   busPIRone  10 mg Oral TID   carvedilol  25 mg Oral BID WC   escitalopram  20 mg Oral Daily   folic acid  1 mg Oral Daily   gabapentin  300 mg Oral TID   hydrochlorothiazide  25 mg Oral Daily   isosorbide mononitrate  60 mg Oral Daily   losartan  50 mg Oral Daily   multivitamin with minerals  1 tablet Oral Daily   nicotine  14 mg Transdermal Daily   pantoprazole  40 mg Oral BH-q7a   QUEtiapine  100 mg Oral QHS   rosuvastatin  40 mg Oral q1800   sodium chloride flush  3 mL Intravenous Q12H   sodium chloride flush  3 mL Intravenous Q12H   thiamine  100 mg Oral Daily   Continuous Infusions:  sodium chloride     sodium chloride 125 mL/hr at 01/17/19 0847   sodium chloride     heparin 1,100 Units/hr (01/17/19 0451)   PRN Meds: sodium chloride, sodium chloride, acetaminophen, acetaminophen, albuterol, ALPRAZolam, diazepam, nitroGLYCERIN, ondansetron (ZOFRAN) IV, sodium chloride flush, sodium chloride flush   Vital Signs    Vitals:   01/16/19 1555 01/16/19 1559 01/17/19 0601 01/17/19 0603  BP: 130/73 130/73 137/68   Pulse:  90 80   Resp:  20    Temp:  98.2 F (36.8 C) 98.5 F (36.9 C)   TempSrc:  Oral Oral   SpO2:  100% 100%   Weight:    87.3 kg  Height:        Intake/Output Summary (Last 24 hours) at 01/17/2019 0859 Last data filed at 01/17/2019 0603 Gross per 24 hour  Intake 904 ml  Output 3200 ml  Net -2296 ml   Last 3 Weights 01/17/2019 01/16/2019 01/14/2019  Weight (lbs) 192 lb 6.4 oz 191 lb 1.6 oz 186 lb 9.6 oz  Weight (kg) 87.272 kg 86.682 kg 84.641 kg  Some encounter information is confidential and restricted. Go to Review Flowsheets activity to see all  data.      Telemetry    SR - Personally Reviewed  ECG    No new tracing.  Physical Exam  Pleasant AAF GEN: No acute distress.   Neck: No JVD Cardiac: RRR, no murmurs, rubs, or gallops.  Respiratory: Clear to auscultation bilaterally. GI: Soft, nontender, non-distended  MS: No edema; No deformity. Right radial cath site.  Neuro:  Nonfocal  Psych: Normal affect   Labs    High Sensitivity Troponin:   Recent Labs  Lab 01/11/19 2313 01/12/19 0212 01/12/19 1051 01/12/19 1908 01/12/19 2111  TROPONINIHS 12 32* 191* 515* 572*      Chemistry Recent Labs  Lab 01/11/19 2313 01/13/19 0342 01/15/19 1134 01/16/19 0752  NA 141 139  --  141  K 3.4* 3.4* 3.8 3.7  CL 107 105  --  107  CO2 22 25  --  24  GLUCOSE 106* 114*  --  128*  BUN 10 13  --  12  CREATININE 0.75 0.69  --  0.80  CALCIUM 9.6 9.2  --  8.8*  GFRNONAA >60 >60  --  >60  GFRAA >60 >60  --  >  60  ANIONGAP 12 9  --  10     Hematology Recent Labs  Lab 01/15/19 0349 01/16/19 0752 01/17/19 0016  WBC 6.7 8.6 13.0*  RBC 4.76 4.62 4.30  HGB 13.5 13.2 12.2  HCT 42.6 41.7 38.0  MCV 89.5 90.3 88.4  MCH 28.4 28.6 28.4  MCHC 31.7 31.7 32.1  RDW 12.9 13.2 13.1  PLT 316 283 280    BNPNo results for input(s): BNP, PROBNP in the last 168 hours.   DDimer No results for input(s): DDIMER in the last 168 hours.   Radiology    Vas US Doppler Pre Cabg  Result Date: 01/16/2019 PREOPERATIVE VASCULAR EVALUATION  Indications:  Pre CABG. Risk Factors: Hypertension. Performing Technologist: Levin Bacon RDMS, RVT  Examination Guidelines: A complete evaluation includes B-mode imaging, spectral Doppler, color Doppler, and power Doppler as needed of all accessible portions of each vessel. Bilateral testing is considered an integral part of a complete examination. Limited examinations for reoccurring indications may be performed as noted.  Right Carotid Findings: +----------+--------+--------+--------+------------+--------+              PSV cm/s EDV cm/s Stenosis Describe     Comments  +----------+--------+--------+--------+------------+--------+  CCA Prox   130      17                                       +----------+--------+--------+--------+------------+--------+  CCA Distal 76       11                                       +----------+--------+--------+--------+------------+--------+  ICA Prox   76       26       1-39%    heterogenous           +----------+--------+--------+--------+------------+--------+  ICA Distal 124      24                                       +----------+--------+--------+--------+------------+--------+  ECA        107      10                                       +----------+--------+--------+--------+------------+--------+ Portions of this table do not appear on this page. +----------+--------+-------+----------------+------------+             PSV cm/s EDV cms Describe         Arm Pressure  +----------+--------+-------+----------------+------------+  Subclavian 125              Multiphasic, WNL               +----------+--------+-------+----------------+------------+ +---------+--------+--+--------+--+---------+  Vertebral PSV cm/s 57 EDV cm/s 13 Antegrade  +---------+--------+--+--------+--+---------+ Left Carotid Findings: +----------+--------+--------+--------+------------+--------+             PSV cm/s EDV cm/s Stenosis Describe     Comments  +----------+--------+--------+--------+------------+--------+  CCA Prox   118      20                                       +----------+--------+--------+--------+------------+--------+  CCA Distal 108      19                                       +----------+--------+--------+--------+------------+--------+  ICA Prox   137      32       1-39%    heterogenous           +----------+--------+--------+--------+------------+--------+  ICA Distal 118      27                                       +----------+--------+--------+--------+------------+--------+  ECA         99       12                                       +----------+--------+--------+--------+------------+--------+ +----------+--------+--------+----------------+------------+  Subclavian PSV cm/s EDV cm/s Describe         Arm Pressure  +----------+--------+--------+----------------+------------+             93                Multiphasic, WNL               +----------+--------+--------+----------------+------------+ +---------+--------+--+--------+--+---------+  Vertebral PSV cm/s 61 EDV cm/s 20 Antegrade  +---------+--------+--+--------+--+---------+  Summary: Right Carotid: Velocities in the right ICA are consistent with a 1-39% stenosis. Left Carotid: Velocities in the left ICA are consistent with a 1-39% stenosis.  Electronically signed by Coral ElseVance Brabham MD on 01/16/2019 at 5:20:00 PM.    Final     Cardiac Studies   Cath: 01/15/19    Ost RCA to Prox RCA lesion is 5% stenosed.  RPDA lesion is 95% stenosed.  Dist RCA lesion is 80% stenosed.  Prox RCA lesion is 70% stenosed.  Mid RCA-1 lesion is 80% stenosed.  Mid RCA-2 lesion is 90% stenosed.  Mid RCA to Dist RCA lesion is 40% stenosed.  1st Mrg lesion is 50% stenosed.  3rd Mrg lesion is 80% stenosed.  Prox LAD lesion is 50% stenosed.  Mid LAD lesion is 50% stenosed.  1st Diag lesion is 80% stenosed.  Dist LAD lesion is 70% stenosed.  Mid Cx to Dist Cx lesion is 90% stenosed.  The left ventricular systolic function is normal.  LV end diastolic pressure is normal.   Significant progressive multivessel CAD with 50% proximal LAD stenosis followed by proximal small to mid stenosis of 50% with diffuse 80% stenosis in the first diagonal vessel and 70% distal LAD stenosis; left circumflex with 50% stenosis in a large OM1 vessel, and with focal distal restenosis in the AV groove stent which enters into the marginal branch that has 90% focal narrowing in the region of the distal small AV groove circumflex with 80% stenosis; and large  dominant RCA with patent proximal RCA stent but with progressive mid distal disease with bifurcation stenosis in the distal RCA extending into the large PDA vessel.  Preserved global LV contractility with EF estimate at 60%; LVEDP 13 mm.  RECOMMENDATION: Will ask colleagues to review.  With progressive disease particularly in the RCA as well as progressive concomitant CAD involving the LAD and circumflex consider surgical consultation for  surgical revascularization.  We will hold off  on P2Y12 inhibition pending possible surgical evaluation.  Alternatively, multi-vesssel intervention to the LCx in-stent restenosis and RCA can be considered.  Aggressive lipid-lowering therapy with target LDL in the 50s or below.  Smoking cessation is imperative.  Diagnostic Dominance: Right   TTE: 01/16/19  IMPRESSIONS    1. Left ventricular ejection fraction, by visual estimation, is 65 to 70%. The left ventricle has hyperdynamic function. There is moderately increased left ventricular hypertrophy.  2. Left ventricular diastolic parameters are consistent with Grade I diastolic dysfunction (impaired relaxation).  3. The left ventricle has no regional wall motion abnormalities.  4. Global right ventricle has normal systolic function.The right ventricular size is normal. No increase in right ventricular wall thickness.  5. Left atrial size was normal.  6. Right atrial size was normal.  7. The mitral valve is normal in structure. No evidence of mitral valve regurgitation. No evidence of mitral stenosis.  8. The tricuspid valve is normal in structure. Tricuspid valve regurgitation is not demonstrated.  9. The aortic valve was not well visualized. Aortic valve regurgitation is not visualized. No evidence of aortic valve sclerosis or stenosis. 10. TR signal is inadequate for assessing pulmonary artery systolic pressure.  Patient Profile     53 y.o. female with known CAD and history of RCA stenting and left  circumflex stenting in 2012, presenting with non-STEMI.  Patient with history of alcohol and tobacco abuse as well as medical noncompliance.  Assessment & Plan    1.  Non-STEMI: Cardiac catheterization showed multivessel coronary artery disease with recommendations for revascularization with either PCI or consideration of CABG for more complete revascularization.  -- remains on IV heparin, aspirin, a beta-blocker, long-acting nitrate, and a high intensity statin drug. -- echo with normal EF -- seen by TCTS PA with pre op testing ordered. Pending surgeon evaluation.   2.  Mixed hyperlipidemia: Patient has not been on medical therapy due to noncompliance.  -- on high intensity statin with Crestor 20 mg daily  3.  Hypertension: Blood pressure controlled on combination of an ARB, long-acting nitrate, hydrochlorothiazide, and carvedilol.  4.  Tobacco abuse: Heavy smoker.  Cessation counseling done.  5.  Alcohol abuse: Cessation counseling done  For questions or updates, please contact [facility] Please consult [email or web address] for contact info under        Signed, Laverda Page, NP  01/17/2019, 8:59 AM    I have personally seen and examined this patient. I agree with the assessment and plan as outlined above.  She is admitted with a NSTEMI and has multi-vessel CAD. CT surgery consult pending. No chest pain today. Echo with preserved LV systolic function. Continue ASA, statin, beta blocker and nitrate. Will follow plans of CT surgery team.   Verne Carrow 01/17/2019 10:26 AM

## 2019-01-17 NOTE — Progress Notes (Signed)
ANTICOAGULATION CONSULT NOTE  Pharmacy Consult for Heparin Indication: chest pain/ACS  Patient Measurements: Height: 5\' 1"  (154.9 cm) Weight: 192 lb 6.4 oz (87.3 kg) IBW/kg (Calculated) : 47.8 Heparin Dosing Weight: 67.3 kg  Vital Signs: Temp: 98.5 F (36.9 C) (12/09 0601) Temp Source: Oral (12/09 0601) BP: 144/77 (12/09 0852) Pulse Rate: 78 (12/09 0852)  Labs: Recent Labs    01/15/19 0349 01/16/19 0752 01/16/19 1543 01/17/19 0016 01/17/19 0941  HGB 13.5 13.2  --  12.2  --   HCT 42.6 41.7  --  38.0  --   PLT 316 283  --  280  --   HEPARINUNFRC 0.39 0.11* 0.26* 0.44 0.36  CREATININE  --  0.80  --   --   --     Estimated Creatinine Clearance: 82.6 mL/min (by C-G formula based on SCr of 0.8 mg/dL).   . sodium chloride    . sodium chloride    . heparin 1,100 Units/hr (01/17/19 0451)   Assessment: 74 YOF s/p cath and found to have severe CAD. Pharmacy consulted to resume Heparin 8 hours s/p sheath removal.  Heparin level is therapeutic at 0.36, on 1100 units/hr. Hgb 12.2, plt 280. No s/sx of bleeding or infusion issues.  Goal of Therapy:  Heparin level 0.3-0.7 units/ml Monitor platelets by anticoagulation protocol: Yes   Plan:  -Cont heparin at 1100 units/hr -Confirmatory heparin level at 1000 -F/u plans for CABG  Antonietta Jewel, PharmD, BCCCP Clinical Pharmacist  Phone: [phone]  Please check AMION for all [place] phone numbers After 10:00 PM, call [place] [phone]

## 2019-01-17 NOTE — Progress Notes (Signed)
Patient is anxious at this time and is waiting to receive her antianxielytic.

## 2019-01-17 NOTE — Progress Notes (Signed)
ANTICOAGULATION CONSULT NOTE  Pharmacy Consult for Heparin Indication: chest pain/ACS  Patient Measurements: Height: 5\' 1"  (154.9 cm) Weight: 191 lb 1.6 oz (86.7 kg) IBW/kg (Calculated) : 47.8 Heparin Dosing Weight: 67.3 kg  Vital Signs:    Labs: Recent Labs    01/15/19 0349 01/16/19 0752 01/16/19 1543 01/17/19 0016  HGB 13.5 13.2  --  12.2  HCT 42.6 41.7  --  38.0  PLT 316 283  --  280  HEPARINUNFRC 0.39 0.11* 0.26* 0.44  CREATININE  --  0.80  --   --     Estimated Creatinine Clearance: 82.3 mL/min (by C-G formula based on SCr of 0.8 mg/dL).   . sodium chloride    . sodium chloride 75 mL/hr at 01/16/19 0037  . sodium chloride    . heparin 1,100 Units/hr (01/16/19 1802)   Assessment: 53 YOF s/p cath and found to have severe CAD. Pharmacy consulted to resume Heparin 8 hours s/p sheath removal.  Heparin level this evening remains SUBtherapeutic despite a rate increase earlier today (HL 0.26 << 0.11, goal of 0.3-0.7). The RN did state that the IV site was switched ~3 hours prior to the level being drawn from the [facility] to an alternative site but reported minimal pauses prior to then. Will still increase slightly and recheck the level in 6 hours.   12/9 AM update: Heparin level therapeutic x 1 this AM after rate increase  Goal of Therapy:  Heparin level 0.3-0.7 units/ml Monitor platelets by anticoagulation protocol: Yes   Plan:  -Cont heparin at 1100 units/hr -Confirmatory heparin level at 1000 -F/u plans for CABG  Narda Bonds, PharmD, [place] Pharmacist Phone: [phone]
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
## 2019-01-18 ENCOUNTER — Encounter (HOSPITAL_COMMUNITY): Payer: Self-pay | Admitting: Internal Medicine

## 2019-01-18 ENCOUNTER — Inpatient Hospital Stay (HOSPITAL_COMMUNITY): Payer: Self-pay | Admitting: Critical Care Medicine

## 2019-01-18 ENCOUNTER — Inpatient Hospital Stay (HOSPITAL_COMMUNITY): Payer: Self-pay

## 2019-01-18 ENCOUNTER — Encounter (HOSPITAL_COMMUNITY)
Admission: EM | Disposition: A | Discharge status: Home / Self Care | Payer: Self-pay | Source: Home / Self Care | Attending: Thoracic Surgery (Cardiothoracic Vascular Surgery)

## 2019-01-18 DIAGNOSIS — I2510 Atherosclerotic heart disease of native coronary artery without angina pectoris: Secondary | ICD-10-CM | POA: Diagnosis present

## 2019-01-18 HISTORY — PX: CORONARY ARTERY BYPASS GRAFT: SHX141

## 2019-01-18 HISTORY — PX: TEE WITHOUT CARDIOVERSION: SHX5443

## 2019-01-18 LAB — CBC WITH DIFFERENTIAL/PLATELET
Abs Immature Granulocytes: 0.07 10*3/uL (ref 0.00–0.07)
Basophils Absolute: 0 10*3/uL (ref 0.0–0.1)
Basophils Relative: 0 %
Eosinophils Absolute: 0.2 10*3/uL (ref 0.0–0.5)
Eosinophils Relative: 1 %
HCT: 36.1 % (ref 36.0–46.0)
Hemoglobin: 11.1 g/dL — ABNORMAL LOW (ref 12.0–15.0)
Immature Granulocytes: 1 %
Lymphocytes Relative: 6 %
Lymphs Abs: 0.8 10*3/uL (ref 0.7–4.0)
MCH: 28.5 pg (ref 26.0–34.0)
MCHC: 30.7 g/dL (ref 30.0–36.0)
MCV: 92.6 fL (ref 80.0–100.0)
Monocytes Absolute: 1.3 10*3/uL — ABNORMAL HIGH (ref 0.1–1.0)
Monocytes Relative: 10 %
Neutro Abs: 11 10*3/uL — ABNORMAL HIGH (ref 1.7–7.7)
Neutrophils Relative %: 82 %
Platelets: 246 10*3/uL (ref 150–400)
RBC: 3.9 MIL/uL (ref 3.87–5.11)
RDW: 13.5 % (ref 11.5–15.5)
WBC: 13.4 10*3/uL — ABNORMAL HIGH (ref 4.0–10.5)
nRBC: 0 % (ref 0.0–0.2)

## 2019-01-18 LAB — POCT I-STAT 7, (LYTES, BLD GAS, ICA,H+H)
Acid-base deficit: 2 mmol/L (ref 0.0–2.0)
Acid-base deficit: 5 mmol/L — ABNORMAL HIGH (ref 0.0–2.0)
Acid-base deficit: 1 mmol/L (ref 0.0–2.0)
Acid-base deficit: 1 mmol/L (ref 0.0–2.0)
Bicarbonate: 23.9 mmol/L (ref 20.0–28.0)
Bicarbonate: 21.7 mmol/L (ref 20.0–28.0)
Bicarbonate: 26.7 mmol/L (ref 20.0–28.0)
Bicarbonate: 24.1 mmol/L (ref 20.0–28.0)
Calcium, Ion: 1.32 mmol/L (ref 1.15–1.40)
Calcium, Ion: 1.15 mmol/L (ref 1.15–1.40)
Calcium, Ion: 1.25 mmol/L (ref 1.15–1.40)
Calcium, Ion: 1.16 mmol/L (ref 1.15–1.40)
HCT: 29 % — ABNORMAL LOW (ref 36.0–46.0)
HCT: 30 % — ABNORMAL LOW (ref 36.0–46.0)
HCT: 34 % — ABNORMAL LOW (ref 36.0–46.0)
HCT: 32 % — ABNORMAL LOW (ref 36.0–46.0)
Hemoglobin: 9.9 g/dL — ABNORMAL LOW (ref 12.0–15.0)
Hemoglobin: 10.2 g/dL — ABNORMAL LOW (ref 12.0–15.0)
Hemoglobin: 11.6 g/dL — ABNORMAL LOW (ref 12.0–15.0)
Hemoglobin: 10.9 g/dL — ABNORMAL LOW (ref 12.0–15.0)
O2 Saturation: 93 %
O2 Saturation: 94 %
O2 Saturation: 96 %
O2 Saturation: 85 %
Patient temperature: 37.3
Potassium: 3.6 mmol/L (ref 3.5–5.1)
Potassium: 4.3 mmol/L (ref 3.5–5.1)
Potassium: 4.9 mmol/L (ref 3.5–5.1)
Potassium: 4.3 mmol/L (ref 3.5–5.1)
Sodium: 142 mmol/L (ref 135–145)
Sodium: 144 mmol/L (ref 135–145)
Sodium: 141 mmol/L (ref 135–145)
Sodium: 143 mmol/L (ref 135–145)
TCO2: 25 mmol/L (ref 22–32)
TCO2: 23 mmol/L (ref 22–32)
TCO2: 28 mmol/L (ref 22–32)
TCO2: 25 mmol/L (ref 22–32)
pCO2 arterial: 45.6 mmHg (ref 32.0–48.0)
pCO2 arterial: 49 mmHg — ABNORMAL HIGH (ref 32.0–48.0)
pCO2 arterial: 55.5 mmHg — ABNORMAL HIGH (ref 32.0–48.0)
pCO2 arterial: 40.2 mmHg (ref 32.0–48.0)
pH, Arterial: 7.327 — ABNORMAL LOW (ref 7.350–7.450)
pH, Arterial: 7.256 — ABNORMAL LOW (ref 7.350–7.450)
pH, Arterial: 7.29 — ABNORMAL LOW (ref 7.350–7.450)
pH, Arterial: 7.386 (ref 7.350–7.450)
pO2, Arterial: 74 mmHg — ABNORMAL LOW (ref 83.0–108.0)
pO2, Arterial: 84 mmHg (ref 83.0–108.0)
pO2, Arterial: 93 mmHg (ref 83.0–108.0)
pO2, Arterial: 50 mmHg — ABNORMAL LOW (ref 83.0–108.0)

## 2019-01-18 LAB — CBC
HCT: 39.5 % (ref 36.0–46.0)
HCT: 31.2 % — ABNORMAL LOW (ref 36.0–46.0)
Hemoglobin: 12.3 g/dL (ref 12.0–15.0)
Hemoglobin: 9.7 g/dL — ABNORMAL LOW (ref 12.0–15.0)
MCH: 28 pg (ref 26.0–34.0)
MCH: 28.5 pg (ref 26.0–34.0)
MCHC: 31.1 g/dL (ref 30.0–36.0)
MCHC: 31.1 g/dL (ref 30.0–36.0)
MCV: 90 fL (ref 80.0–100.0)
MCV: 91.8 fL (ref 80.0–100.0)
Platelets: 287 10*3/uL (ref 150–400)
Platelets: 136 10*3/uL — ABNORMAL LOW (ref 150–400)
RBC: 4.39 MIL/uL (ref 3.87–5.11)
RBC: 3.4 MIL/uL — ABNORMAL LOW (ref 3.87–5.11)
RDW: 13.3 % (ref 11.5–15.5)
RDW: 13.4 % (ref 11.5–15.5)
WBC: 10.4 10*3/uL (ref 4.0–10.5)
WBC: 12.1 10*3/uL — ABNORMAL HIGH (ref 4.0–10.5)
nRBC: 0 % (ref 0.0–0.2)
nRBC: 0 % (ref 0.0–0.2)

## 2019-01-18 LAB — BASIC METABOLIC PANEL
Anion gap: 8 (ref 5–15)
Anion gap: 6 (ref 5–15)
BUN: 8 mg/dL (ref 6–20)
BUN: 7 mg/dL (ref 6–20)
CO2: 26 mmol/L (ref 22–32)
CO2: 25 mmol/L (ref 22–32)
Calcium: 9.2 mg/dL (ref 8.9–10.3)
Calcium: 8.2 mg/dL — ABNORMAL LOW (ref 8.9–10.3)
Chloride: 108 mmol/L (ref 98–111)
Chloride: 111 mmol/L (ref 98–111)
Creatinine, Ser: 0.76 mg/dL (ref 0.44–1.00)
Creatinine, Ser: 0.74 mg/dL (ref 0.44–1.00)
GFR calc Af Amer: >60 mL/min (ref >60)
GFR calc Af Amer: >60 mL/min (ref >60)
GFR calc non Af Amer: >60 mL/min (ref >60)
GFR calc non Af Amer: >60 mL/min (ref >60)
Glucose, Bld: 111 mg/dL — ABNORMAL HIGH (ref 70–99)
Glucose, Bld: 122 mg/dL — ABNORMAL HIGH (ref 70–99)
Potassium: 3.7 mmol/L (ref 3.5–5.1)
Potassium: 4.7 mmol/L (ref 3.5–5.1)
Sodium: 142 mmol/L (ref 135–145)
Sodium: 142 mmol/L (ref 135–145)

## 2019-01-18 LAB — HEMOGLOBIN AND HEMATOCRIT, BLOOD
HCT: 25.2 % — ABNORMAL LOW (ref 36.0–46.0)
Hemoglobin: 8 g/dL — ABNORMAL LOW (ref 12.0–15.0)

## 2019-01-18 LAB — ECHO INTRAOPERATIVE TEE
Height: 61 in
Weight: 3040.58 oz

## 2019-01-18 LAB — GLUCOSE, CAPILLARY
Glucose-Capillary: 106 mg/dL — ABNORMAL HIGH (ref 70–99)
Glucose-Capillary: 109 mg/dL — ABNORMAL HIGH (ref 70–99)
Glucose-Capillary: 89 mg/dL (ref 70–99)
Glucose-Capillary: 98 mg/dL (ref 70–99)
Glucose-Capillary: 100 mg/dL — ABNORMAL HIGH (ref 70–99)
Glucose-Capillary: 116 mg/dL — ABNORMAL HIGH (ref 70–99)
Glucose-Capillary: 127 mg/dL — ABNORMAL HIGH (ref 70–99)
Glucose-Capillary: 105 mg/dL — ABNORMAL HIGH (ref 70–99)
Glucose-Capillary: 131 mg/dL — ABNORMAL HIGH (ref 70–99)

## 2019-01-18 LAB — PROTIME-INR
INR: 1.4 — ABNORMAL HIGH (ref 0.8–1.2)
Prothrombin Time: 17 seconds — ABNORMAL HIGH (ref 11.4–15.2)

## 2019-01-18 LAB — POCT PREGNANCY, URINE: Preg Test, Ur: NEGATIVE

## 2019-01-18 LAB — APTT: aPTT: 33 seconds (ref 24–36)

## 2019-01-18 LAB — MAGNESIUM: Magnesium: 2.8 mg/dL — ABNORMAL HIGH (ref 1.7–2.4)

## 2019-01-18 LAB — PLATELET COUNT: Platelets: 189 10*3/uL (ref 150–400)

## 2019-01-18 SURGERY — CORONARY ARTERY BYPASS GRAFTING (CABG)
Anesthesia: General | Site: Chest

## 2019-01-18 MED ORDER — CHLORHEXIDINE GLUCONATE CLOTH 2 % EX PADS
6.0000 | MEDICATED_PAD | Freq: Every day | CUTANEOUS | Status: DC
  Administered 2019-01-19 – 2019-01-23 (×4): 6 via TOPICAL

## 2019-01-18 MED ORDER — MIDAZOLAM HCL 2 MG/2ML IJ SOLN: 2.0000 mg | INTRAMUSCULAR | Status: DC | PRN

## 2019-01-18 MED ORDER — PLASMA-LYTE 148 IV SOLN
INTRAVENOUS | Status: DC | PRN
  Administered 2019-01-18: 500 mL via INTRAVASCULAR

## 2019-01-18 MED ORDER — SODIUM CHLORIDE 0.9% FLUSH
3.0000 mL | INTRAVENOUS | Status: DC | PRN
  Administered 2019-01-21: 3 mL via INTRAVENOUS

## 2019-01-18 MED ORDER — SODIUM CHLORIDE 0.9 % IV SOLN
1.5000 g | Freq: Two times a day (BID) | INTRAVENOUS | Status: AC
  Administered 2019-01-18 – 2019-01-20 (×4): 1.5 g via INTRAVENOUS
  Filled 2019-01-18 (×5): qty 1.5

## 2019-01-18 MED ORDER — NOREPINEPHRINE 4 MG/250ML-% IV SOLN: 0.0000 ug/min | INTRAVENOUS | Status: DC

## 2019-01-18 MED ORDER — SODIUM CHLORIDE 0.45 % IV SOLN: INTRAVENOUS | Status: DC | PRN

## 2019-01-18 MED ORDER — ALBUMIN HUMAN 5 % IV SOLN
250.0000 mL | INTRAVENOUS | Status: AC | PRN
  Administered 2019-01-18: 12.5 g via INTRAVENOUS

## 2019-01-18 MED ORDER — LACTATED RINGERS IV SOLN
INTRAVENOUS | Status: DC
  Administered 2019-01-18: 08:00:00 via INTRAVENOUS

## 2019-01-18 MED ORDER — SODIUM BICARBONATE 8.4 % IV SOLN
50.0000 meq | Freq: Once | INTRAVENOUS | Status: AC
  Administered 2019-01-18: 50 meq via INTRAVENOUS

## 2019-01-18 MED ORDER — PROTAMINE SULFATE 10 MG/ML IV SOLN
INTRAVENOUS | Status: AC
  Filled 2019-01-18: qty 5

## 2019-01-18 MED ORDER — ALBUMIN HUMAN 5 % IV SOLN
INTRAVENOUS | Status: DC | PRN
  Administered 2019-01-18: 14:00:00 via INTRAVENOUS

## 2019-01-18 MED ORDER — NITROGLYCERIN IN D5W 200-5 MCG/ML-% IV SOLN: 0.0000 ug/min | INTRAVENOUS | Status: DC

## 2019-01-18 MED ORDER — METOPROLOL TARTRATE 25 MG/10 ML ORAL SUSPENSION
12.5000 mg | Freq: Two times a day (BID) | ORAL | Status: DC
  Filled 2019-01-18 (×6): qty 5

## 2019-01-18 MED ORDER — LIDOCAINE 2% (20 MG/ML) 5 ML SYRINGE
INTRAMUSCULAR | Status: AC
  Filled 2019-01-18: qty 5

## 2019-01-18 MED ORDER — FENTANYL CITRATE (PF) 250 MCG/5ML IJ SOLN
INTRAMUSCULAR | Status: AC
  Filled 2019-01-18: qty 5

## 2019-01-18 MED ORDER — MIDAZOLAM HCL (PF) 10 MG/2ML IJ SOLN
INTRAMUSCULAR | Status: AC
  Filled 2019-01-18: qty 2

## 2019-01-18 MED ORDER — PROTAMINE SULFATE 10 MG/ML IV SOLN
INTRAVENOUS | Status: AC
  Filled 2019-01-18: qty 25

## 2019-01-18 MED ORDER — MIDAZOLAM HCL 5 MG/5ML IJ SOLN
INTRAMUSCULAR | Status: DC | PRN
  Administered 2019-01-18 (×2): 1 mg via INTRAVENOUS
  Administered 2019-01-18: 2 mg via INTRAVENOUS
  Administered 2019-01-18 (×4): 1 mg via INTRAVENOUS

## 2019-01-18 MED ORDER — ROCURONIUM BROMIDE 10 MG/ML (PF) SYRINGE
PREFILLED_SYRINGE | INTRAVENOUS | Status: AC
  Filled 2019-01-18: qty 20

## 2019-01-18 MED ORDER — INSULIN REGULAR(HUMAN) IN NACL 100-0.9 UT/100ML-% IV SOLN: INTRAVENOUS | Status: DC

## 2019-01-18 MED ORDER — METOPROLOL TARTRATE 5 MG/5ML IV SOLN: 2.5000 mg | INTRAVENOUS | Status: DC | PRN

## 2019-01-18 MED ORDER — METOPROLOL TARTRATE 12.5 MG HALF TABLET
12.5000 mg | ORAL_TABLET | Freq: Two times a day (BID) | ORAL | Status: DC
  Administered 2019-01-18 – 2019-01-21 (×7): 12.5 mg via ORAL
  Filled 2019-01-18 (×7): qty 1

## 2019-01-18 MED ORDER — SODIUM CHLORIDE 0.9 % IV SOLN: INTRAVENOUS | Status: DC

## 2019-01-18 MED ORDER — PROTAMINE SULFATE 10 MG/ML IV SOLN
INTRAVENOUS | Status: DC | PRN
  Administered 2019-01-18: 300 mg via INTRAVENOUS

## 2019-01-18 MED ORDER — FAMOTIDINE IN NACL 20-0.9 MG/50ML-% IV SOLN
20.0000 mg | Freq: Two times a day (BID) | INTRAVENOUS | Status: DC
  Administered 2019-01-18: 20 mg via INTRAVENOUS

## 2019-01-18 MED ORDER — ACETAMINOPHEN 650 MG RE SUPP
650.0000 mg | Freq: Once | RECTAL | Status: AC
  Administered 2019-01-18: 650 mg via RECTAL

## 2019-01-18 MED ORDER — PHENYLEPHRINE HCL-NACL 20-0.9 MG/250ML-% IV SOLN: 0.0000 ug/min | INTRAVENOUS | Status: DC

## 2019-01-18 MED ORDER — LIDOCAINE 2% (20 MG/ML) 5 ML SYRINGE
INTRAMUSCULAR | Status: DC | PRN
  Administered 2019-01-18: 100 mg via INTRAVENOUS

## 2019-01-18 MED ORDER — SODIUM CHLORIDE 0.9% FLUSH
3.0000 mL | Freq: Two times a day (BID) | INTRAVENOUS | Status: DC
  Administered 2019-01-19 – 2019-01-24 (×5): 3 mL via INTRAVENOUS

## 2019-01-18 MED ORDER — LACTATED RINGERS IV SOLN: INTRAVENOUS | Status: DC

## 2019-01-18 MED ORDER — NICARDIPINE HCL IN NACL 20-0.86 MG/200ML-% IV SOLN: 5.0000 mg/h | INTRAVENOUS | Status: DC

## 2019-01-18 MED ORDER — FENTANYL CITRATE (PF) 250 MCG/5ML IJ SOLN
INTRAMUSCULAR | Status: AC
  Filled 2019-01-18: qty 25

## 2019-01-18 MED ORDER — MORPHINE SULFATE (PF) 2 MG/ML IV SOLN
1.0000 mg | INTRAVENOUS | Status: DC | PRN
  Administered 2019-01-18: 4 mg via INTRAVENOUS
  Administered 2019-01-19: 16:00:00 2 mg via INTRAVENOUS
  Administered 2019-01-19: 4 mg via INTRAVENOUS
  Filled 2019-01-18 (×2): qty 2
  Filled 2019-01-18 (×2): qty 1

## 2019-01-18 MED ORDER — HEPARIN SODIUM (PORCINE) 1000 UNIT/ML IJ SOLN
INTRAMUSCULAR | Status: AC
  Filled 2019-01-18: qty 1

## 2019-01-18 MED ORDER — CHLORHEXIDINE GLUCONATE 0.12 % MT SOLN
15.0000 mL | OROMUCOSAL | Status: AC
  Administered 2019-01-18: 15:00:00 15 mL via OROMUCOSAL

## 2019-01-18 MED ORDER — TRAMADOL HCL 50 MG PO TABS
50.0000 mg | ORAL_TABLET | ORAL | Status: DC | PRN
  Administered 2019-01-19 – 2019-01-25 (×12): 50 mg via ORAL
  Filled 2019-01-18 (×13): qty 1

## 2019-01-18 MED ORDER — DEXTROSE 50 % IV SOLN: 0.0000 mL | INTRAVENOUS | Status: DC | PRN

## 2019-01-18 MED ORDER — POTASSIUM CHLORIDE 10 MEQ/50ML IV SOLN
10.0000 meq | INTRAVENOUS | Status: AC
  Administered 2019-01-18 (×3): 10 meq via INTRAVENOUS

## 2019-01-18 MED ORDER — BISACODYL 5 MG PO TBEC
10.0000 mg | DELAYED_RELEASE_TABLET | Freq: Every day | ORAL | Status: DC
  Administered 2019-01-19 – 2019-01-21 (×3): 10 mg via ORAL
  Filled 2019-01-18 (×7): qty 2

## 2019-01-18 MED ORDER — LACTATED RINGERS IV SOLN: 500.0000 mL | Freq: Once | INTRAVENOUS | Status: DC | PRN

## 2019-01-18 MED ORDER — MAGNESIUM SULFATE 4 GM/100ML IV SOLN
4.0000 g | Freq: Once | INTRAVENOUS | Status: AC
  Administered 2019-01-18: 16:00:00 4 g via INTRAVENOUS
  Filled 2019-01-18: qty 100

## 2019-01-18 MED ORDER — 0.9 % SODIUM CHLORIDE (POUR BTL) OPTIME
TOPICAL | Status: DC | PRN
  Administered 2019-01-18: 5000 mL

## 2019-01-18 MED ORDER — ACETAMINOPHEN 160 MG/5ML PO SOLN: 1000.0000 mg | Freq: Four times a day (QID) | ORAL | Status: AC

## 2019-01-18 MED ORDER — NICARDIPINE HCL IN NACL 20-0.86 MG/200ML-% IV SOLN
3.0000 mg/h | INTRAVENOUS | Status: DC
  Administered 2019-01-18: 3 mg/h via INTRAVENOUS
  Filled 2019-01-18: qty 200

## 2019-01-18 MED ORDER — LACTATED RINGERS IV SOLN
INTRAVENOUS | Status: DC | PRN
  Administered 2019-01-18: 08:00:00 via INTRAVENOUS

## 2019-01-18 MED ORDER — PROPOFOL 10 MG/ML IV BOLUS
INTRAVENOUS | Status: DC | PRN
  Administered 2019-01-18: 50 mg via INTRAVENOUS

## 2019-01-18 MED ORDER — PHENYLEPHRINE 40 MCG/ML (10ML) SYRINGE FOR IV PUSH (FOR BLOOD PRESSURE SUPPORT)
PREFILLED_SYRINGE | INTRAVENOUS | Status: AC
  Filled 2019-01-18: qty 10

## 2019-01-18 MED ORDER — SODIUM CHLORIDE 0.9 % IV SOLN: 250.0000 mL | INTRAVENOUS | Status: DC

## 2019-01-18 MED ORDER — ARTIFICIAL TEARS OPHTHALMIC OINT
TOPICAL_OINTMENT | OPHTHALMIC | Status: DC | PRN
  Administered 2019-01-18: 1 via OPHTHALMIC

## 2019-01-18 MED ORDER — ASPIRIN EC 325 MG PO TBEC
325.0000 mg | DELAYED_RELEASE_TABLET | Freq: Every day | ORAL | Status: DC
  Administered 2019-01-19 – 2019-01-25 (×7): 325 mg via ORAL
  Filled 2019-01-18 (×7): qty 1

## 2019-01-18 MED ORDER — ACETAMINOPHEN 500 MG PO TABS
1000.0000 mg | ORAL_TABLET | Freq: Four times a day (QID) | ORAL | Status: AC
  Administered 2019-01-18 – 2019-01-23 (×12): 1000 mg via ORAL
  Filled 2019-01-18 (×12): qty 2

## 2019-01-18 MED ORDER — PANTOPRAZOLE SODIUM 40 MG PO TBEC
40.0000 mg | DELAYED_RELEASE_TABLET | Freq: Every day | ORAL | Status: DC
  Administered 2019-01-20 – 2019-01-25 (×6): 40 mg via ORAL
  Filled 2019-01-18 (×6): qty 1

## 2019-01-18 MED ORDER — HEPARIN SODIUM (PORCINE) 1000 UNIT/ML IJ SOLN
INTRAMUSCULAR | Status: DC | PRN
  Administered 2019-01-18: 30000 [IU] via INTRAVENOUS

## 2019-01-18 MED ORDER — ACETAMINOPHEN 160 MG/5ML PO SOLN: 650.0000 mg | Freq: Once | ORAL | Status: AC

## 2019-01-18 MED ORDER — ASPIRIN 81 MG PO CHEW
324.0000 mg | CHEWABLE_TABLET | Freq: Every day | ORAL | Status: DC
  Filled 2019-01-18: qty 4

## 2019-01-18 MED ORDER — DEXMEDETOMIDINE HCL IN NACL 400 MCG/100ML IV SOLN: 0.0000 ug/kg/h | INTRAVENOUS | Status: DC

## 2019-01-18 MED ORDER — FENTANYL CITRATE (PF) 250 MCG/5ML IJ SOLN
INTRAMUSCULAR | Status: DC | PRN
  Administered 2019-01-18: 50 ug via INTRAVENOUS
  Administered 2019-01-18 (×2): 100 ug via INTRAVENOUS
  Administered 2019-01-18: 50 ug via INTRAVENOUS
  Administered 2019-01-18: 25 ug via INTRAVENOUS
  Administered 2019-01-18 (×3): 50 ug via INTRAVENOUS
  Administered 2019-01-18: 100 ug via INTRAVENOUS
  Administered 2019-01-18: 25 ug via INTRAVENOUS
  Administered 2019-01-18 (×3): 100 ug via INTRAVENOUS
  Administered 2019-01-18: 50 ug via INTRAVENOUS
  Administered 2019-01-18: 200 ug via INTRAVENOUS
  Administered 2019-01-18 (×2): 50 ug via INTRAVENOUS
  Administered 2019-01-18: 500 ug via INTRAVENOUS

## 2019-01-18 MED ORDER — BISACODYL 10 MG RE SUPP: 10.0000 mg | Freq: Every day | RECTAL | Status: DC

## 2019-01-18 MED ORDER — HEMOSTATIC AGENTS (NO CHARGE) OPTIME
TOPICAL | Status: DC | PRN
  Administered 2019-01-18 (×3): 1 via TOPICAL

## 2019-01-18 MED ORDER — STERILE WATER FOR IRRIGATION IR SOLN
Status: DC | PRN
  Administered 2019-01-18: 2000 mL

## 2019-01-18 MED ORDER — DOCUSATE SODIUM 100 MG PO CAPS
200.0000 mg | ORAL_CAPSULE | Freq: Every day | ORAL | Status: DC
  Administered 2019-01-19 – 2019-01-24 (×4): 200 mg via ORAL
  Filled 2019-01-18 (×7): qty 2

## 2019-01-18 MED ORDER — LACTATED RINGERS IV SOLN
INTRAVENOUS | Status: DC | PRN
  Administered 2019-01-18 (×2): via INTRAVENOUS

## 2019-01-18 MED ORDER — ONDANSETRON HCL 4 MG/2ML IJ SOLN: 4.0000 mg | Freq: Four times a day (QID) | INTRAMUSCULAR | Status: DC | PRN

## 2019-01-18 MED ORDER — LEVALBUTEROL HCL 0.63 MG/3ML IN NEBU
0.6300 mg | INHALATION_SOLUTION | Freq: Three times a day (TID) | RESPIRATORY_TRACT | Status: DC | PRN
  Administered 2019-01-20: 21:00:00 0.63 mg via RESPIRATORY_TRACT

## 2019-01-18 MED ORDER — PROPOFOL 10 MG/ML IV BOLUS
INTRAVENOUS | Status: AC
  Filled 2019-01-18: qty 20

## 2019-01-18 MED ORDER — ROCURONIUM BROMIDE 10 MG/ML (PF) SYRINGE
PREFILLED_SYRINGE | INTRAVENOUS | Status: DC | PRN
  Administered 2019-01-18: 100 mg via INTRAVENOUS
  Administered 2019-01-18 (×2): 50 mg via INTRAVENOUS

## 2019-01-18 MED ORDER — VANCOMYCIN HCL IN DEXTROSE 1-5 GM/200ML-% IV SOLN
1000.0000 mg | Freq: Once | INTRAVENOUS | Status: AC
  Administered 2019-01-18: 1000 mg via INTRAVENOUS
  Filled 2019-01-18: qty 200

## 2019-01-18 SURGICAL SUPPLY — 115 items
ADAPTER MULTI PERFUSION 15 (ADAPTER) ×2 IMPLANT
ADH SKN CLS APL DERMABOND .7 (GAUZE/BANDAGES/DRESSINGS) ×2
BAG DECANTER FOR FLEXI CONT (MISCELLANEOUS) ×6 IMPLANT
BASKET HEART  (ORDER IN 25'S) (MISCELLANEOUS) ×1
BASKET HEART (ORDER IN 25'S) (MISCELLANEOUS) ×1
BASKET HEART (ORDER IN 25S) (MISCELLANEOUS) ×2 IMPLANT
BLADE 11 SAFETY STRL DISP (BLADE) ×2 IMPLANT
BLADE CLIPPER SURG (BLADE) ×2 IMPLANT
BLADE STERNUM SYSTEM 6 (BLADE) ×4 IMPLANT
BNDG ELASTIC 4X5.8 VLCR STR LF (GAUZE/BANDAGES/DRESSINGS) ×4 IMPLANT
BNDG ELASTIC 6X5.8 VLCR STR LF (GAUZE/BANDAGES/DRESSINGS) ×4 IMPLANT
BNDG GAUZE ELAST 4 BULKY (GAUZE/BANDAGES/DRESSINGS) ×4 IMPLANT
CABLE SURGICAL S-101-97-12 (CABLE) ×2 IMPLANT
CANISTER SUCT 3000ML PPV (MISCELLANEOUS) ×4 IMPLANT
CANISTER WOUNDNEG PRESSURE 500 (CANNISTER) ×2 IMPLANT
CANNULA EZ GLIDE 8.0 24FR (CANNULA) ×4 IMPLANT
CANNULA MC2 2 STG 29/37 NON-V (CANNULA) ×2 IMPLANT
CANNULA MC2 TWO STAGE (CANNULA) ×2
CANNULA VESSEL 3MM BLUNT TIP (CANNULA) ×2 IMPLANT
CATH CPB KIT HENDRICKSON (MISCELLANEOUS) ×2 IMPLANT
CLIP RETRACTION 3.0MM CORONARY (MISCELLANEOUS) ×2 IMPLANT
CLIP VESOCCLUDE MED 24/CT (CLIP) IMPLANT
CLIP VESOCCLUDE SM WIDE 24/CT (CLIP) ×4 IMPLANT
CONN ST 1/2X1/2  BEN (MISCELLANEOUS) ×2
CONN ST 1/2X1/2 BEN (MISCELLANEOUS) ×2 IMPLANT
CONNECTOR BLAKE 2:1 CARIO BLK (MISCELLANEOUS) ×4 IMPLANT
COVER WAND RF STERILE (DRAPES) ×2 IMPLANT
DERMABOND ADVANCED (GAUZE/BANDAGES/DRESSINGS) ×2
DERMABOND ADVANCED .7 DNX12 (GAUZE/BANDAGES/DRESSINGS) IMPLANT
DRAIN CHANNEL 19F RND (DRAIN) ×4 IMPLANT
DRAIN CONNECTOR BLAKE 1:1 (MISCELLANEOUS) ×2 IMPLANT
DRAPE CARDIOVASCULAR INCISE (DRAPES) ×4
DRAPE INCISE IOBAN 66X45 STRL (DRAPES) ×4 IMPLANT
DRAPE SLUSH/WARMER DISC (DRAPES) ×4 IMPLANT
DRAPE SRG 135X102X78XABS (DRAPES) ×2 IMPLANT
DRESSING PEEL AND PLAC PRVNA20 (GAUZE/BANDAGES/DRESSINGS) IMPLANT
DRSG COVADERM 4X14 (GAUZE/BANDAGES/DRESSINGS) ×4 IMPLANT
DRSG PEEL AND PLACE PREVENA 20 (GAUZE/BANDAGES/DRESSINGS) ×4
ELECT CAUTERY BLADE 6.4 (BLADE) ×2 IMPLANT
ELECT REM PT RETURN 9FT ADLT (ELECTROSURGICAL) ×8
ELECTRODE REM PT RTRN 9FT ADLT (ELECTROSURGICAL) ×4 IMPLANT
FELT TEFLON 1X6 (MISCELLANEOUS) ×8 IMPLANT
GAUZE SPONGE 4X4 12PLY STRL (GAUZE/BANDAGES/DRESSINGS) ×8 IMPLANT
GLOVE BIO SURGEON STRL SZ7 (GLOVE) ×4 IMPLANT
GLOVE BIOGEL PI IND STRL 6 (GLOVE) IMPLANT
GLOVE BIOGEL PI IND STRL 6.5 (GLOVE) IMPLANT
GLOVE BIOGEL PI IND STRL 7.0 (GLOVE) IMPLANT
GLOVE BIOGEL PI IND STRL 7.5 (GLOVE) ×4 IMPLANT
GLOVE BIOGEL PI INDICATOR 6 (GLOVE) ×8
GLOVE BIOGEL PI INDICATOR 6.5 (GLOVE) ×18
GLOVE BIOGEL PI INDICATOR 7.0 (GLOVE) ×6
GLOVE BIOGEL PI INDICATOR 7.5 (GLOVE)
GLOVE SURG SS PI 6.0 STRL IVOR (GLOVE) ×2 IMPLANT
GLOVE SURG SS PI 7.5 STRL IVOR (GLOVE) ×4 IMPLANT
GOWN STRL REUS W/ TWL LRG LVL3 (GOWN DISPOSABLE) ×8 IMPLANT
GOWN STRL REUS W/ TWL XL LVL3 (GOWN DISPOSABLE) ×4 IMPLANT
GOWN STRL REUS W/TWL LRG LVL3 (GOWN DISPOSABLE) ×24
GOWN STRL REUS W/TWL XL LVL3 (GOWN DISPOSABLE) ×8
HEMOSTAT POWDER SURGIFOAM 1G (HEMOSTASIS) ×12 IMPLANT
KIT BASIN OR (CUSTOM PROCEDURE TRAY) ×4 IMPLANT
KIT DRAINAGE VACCUM ASSIST (KITS) ×2 IMPLANT
KIT SUCTION CATH 14FR (SUCTIONS) ×12 IMPLANT
KIT TURNOVER KIT B (KITS) ×4 IMPLANT
KIT VASOVIEW ACCESSORY VH 2004 (KITS) ×2 IMPLANT
KIT VASOVIEW HEMOPRO 2 VH 4000 (KITS) ×4 IMPLANT
LEAD PACING MYOCARDI (MISCELLANEOUS) ×4 IMPLANT
MARKER GRAFT CORONARY BYPASS (MISCELLANEOUS) ×12 IMPLANT
NS IRRIG 1000ML POUR BTL (IV SOLUTION) ×20 IMPLANT
PACK E OPEN HEART (SUTURE) ×4 IMPLANT
PACK OPEN HEART (CUSTOM PROCEDURE TRAY) ×4 IMPLANT
PAD ARMBOARD 7.5X6 YLW CONV (MISCELLANEOUS) ×8 IMPLANT
PAD ELECT DEFIB RADIOL ZOLL (MISCELLANEOUS) ×4 IMPLANT
PENCIL BUTTON HOLSTER BLD 10FT (ELECTRODE) ×4 IMPLANT
POSITIONER HEAD DONUT 9IN (MISCELLANEOUS) ×4 IMPLANT
PUNCH AORTIC ROTATE 4.0MM (MISCELLANEOUS) IMPLANT
PUNCH AORTIC ROTATE 4.5MM 8IN (MISCELLANEOUS) ×2 IMPLANT
PUNCH AORTIC ROTATE 5MM 8IN (MISCELLANEOUS) IMPLANT
SET CARDIOPLEGIA MPS 5001102 (MISCELLANEOUS) ×2 IMPLANT
SPONGE LAP 18X18 RF (DISPOSABLE) ×4 IMPLANT
SPONGE LAP 4X18 RFD (DISPOSABLE) IMPLANT
STAPLER VISISTAT 35W (STAPLE) ×2 IMPLANT
SUT BONE WAX W31G (SUTURE) ×4 IMPLANT
SUT MNCRL AB 3-0 PS2 18 (SUTURE) ×8 IMPLANT
SUT MNCRL AB 4-0 PS2 18 (SUTURE) ×2 IMPLANT
SUT PDS AB 1 CTX 36 (SUTURE) ×8 IMPLANT
SUT PROLENE 2 0 SH DA (SUTURE) IMPLANT
SUT PROLENE 3 0 SH DA (SUTURE) ×4 IMPLANT
SUT PROLENE 3 0 SH1 36 (SUTURE) IMPLANT
SUT PROLENE 4 0 RB 1 (SUTURE)
SUT PROLENE 4 0 SH DA (SUTURE) ×2 IMPLANT
SUT PROLENE 4-0 RB1 .5 CRCL 36 (SUTURE) IMPLANT
SUT PROLENE 5 0 C 1 36 (SUTURE) ×14 IMPLANT
SUT PROLENE 6 0 C 1 30 (SUTURE) IMPLANT
SUT PROLENE 7 0 BV 1 (SUTURE) ×6 IMPLANT
SUT PROLENE 7 0 BV1 MDA (SUTURE) ×2 IMPLANT
SUT PROLENE 8 0 BV175 6 (SUTURE) IMPLANT
SUT PROLENE BLUE 7 0 (SUTURE) ×4 IMPLANT
SUT PROLENE POLY MONO (SUTURE) IMPLANT
SUT STEEL 6MS V (SUTURE) ×4 IMPLANT
SUT STEEL SZ 6 DBL 3X14 BALL (SUTURE) ×6 IMPLANT
SUT VIC AB 2-0 CT1 27 (SUTURE) ×8
SUT VIC AB 2-0 CT1 TAPERPNT 27 (SUTURE) IMPLANT
SYSTEM SAHARA CHEST DRAIN ATS (WOUND CARE) ×4 IMPLANT
TAPE CLOTH SURG 4X10 WHT LF (GAUZE/BANDAGES/DRESSINGS) ×2 IMPLANT
TAPE PAPER 2X10 WHT MICROPORE (GAUZE/BANDAGES/DRESSINGS) ×2 IMPLANT
TOWEL GREEN STERILE (TOWEL DISPOSABLE) ×4 IMPLANT
TOWEL GREEN STERILE FF (TOWEL DISPOSABLE) ×4 IMPLANT
TRAY FOL W/BAG SLVR 16FR STRL (SET/KITS/TRAYS/PACK) IMPLANT
TRAY FOLEY SLVR 16FR TEMP STAT (SET/KITS/TRAYS/PACK) ×2 IMPLANT
TRAY FOLEY W/BAG SLVR 16FR LF (SET/KITS/TRAYS/PACK) ×4
TUBE SUCT INTRACARD DLP 20F (MISCELLANEOUS) ×2 IMPLANT
TUBE SUCTION CARDIAC 10FR (CANNULA) ×2 IMPLANT
TUBING LAP HI FLOW INSUFFLATIO (TUBING) ×4 IMPLANT
UNDERPAD 30X30 (UNDERPADS AND DIAPERS) ×4 IMPLANT
WATER STERILE IRR 1000ML POUR (IV SOLUTION) ×8 IMPLANT

## 2019-01-18 NOTE — Transfer of Care (Signed)
Immediate Anesthesia Transfer of Care Note  Patient: [name]  Procedure(s) Performed: CORONARY ARTERY BYPASS GRAFTING (CABG) X4 ON PUMP USING LEFT INTERNAL MAMMARY ARTERY AND LEFT GREATER SAPHENOUS VEIN ENDOSCOPICALLY HARVESTED GRAFTS (N/A Chest) Transesophageal Echocardiogram Darden Dates)  Patient Location: SICU  Anesthesia Type:General  Level of Consciousness: Patient remains intubated per anesthesia plan  Airway & Oxygen Therapy: Patient remains intubated per anesthesia plan and Patient placed on Ventilator (see vital sign flow sheet for setting)  Post-op Assessment: Report given to RN and Post -op Vital signs reviewed and stable  Post vital signs: stable HR 65, BP 135/88 (94), sats 100% , placed on vent by RT. Last Vitals:  Vitals Value Taken Time  BP    Temp    Pulse    Resp 16 01/18/19 1435  SpO2    Vitals shown include unvalidated device data.  Last Pain:  Vitals:   01/18/19 0551  TempSrc: Oral  PainSc:       Patients Stated Pain Goal: 0 (23/76/28 3151)  Complications: No apparent anesthesia complications

## 2019-01-18 NOTE — Progress Notes (Signed)
Patient ID: [name], female   DOB: 07-02-1965, 53 y.o.   MRN: [id number]  TCTS Evening Rounds:   Hemodynamically stable  CI = 2.4  Has started to wake up on vent.    Urine output good  CT output low  CBC    Component Value Date/Time   WBC 12.1 (H) 01/18/2019 1452   RBC 3.40 (L) 01/18/2019 1452   HGB 9.7 (L) 01/18/2019 1452   HGB 13.7 02/11/2016 1057   HCT 31.2 (L) 01/18/2019 1452   HCT 42.9 02/11/2016 1057   PLT 136 (L) 01/18/2019 1452   PLT 315 02/11/2016 1057   MCV 91.8 01/18/2019 1452   MCV 88 02/11/2016 1057   MCH 28.5 01/18/2019 1452   MCHC 31.1 01/18/2019 1452   RDW 13.4 01/18/2019 1452   RDW 14.1 02/11/2016 1057   LYMPHSABS 1.4 06/23/2018 1104   MONOABS 0.5 06/23/2018 1104   EOSABS 0.1 06/23/2018 1104   BASOSABS 0.0 06/23/2018 1104     BMET    Component Value Date/Time   NA 142 01/18/2019 0501   NA 140 02/11/2016 1057   K 3.7 01/18/2019 0501   CL 108 01/18/2019 0501   CO2 26 01/18/2019 0501   GLUCOSE 111 (H) 01/18/2019 0501   BUN 8 01/18/2019 0501   BUN 12 02/11/2016 1057   CREATININE 0.76 01/18/2019 0501   CREATININE 0.72 03/28/2015 1001   CALCIUM 9.2 01/18/2019 0501   GFRNONAA >60 01/18/2019 0501   GFRAA >60 01/18/2019 0501     A/P:  Stable postop course. Continue current plans

## 2019-01-18 NOTE — Progress Notes (Signed)
RN called Bartle MD to report pt's latest ABG results, pt passed all parameters on breathing exercises per rapid wean protocol. MD reports it is okay to go ahead and extubate.   When pt returned to room, RT had already placed pt on nasal cannula due to pt extubating herself. Pt's breathing is even and unlabored on 5L [place]. Pt is able to speak, gets up to 250 on incentive, and has been coached on deep breathing exercises. Pt is alert and oriented and reports minor chest pain. Will continue to monitor.

## 2019-01-18 NOTE — Anesthesia Procedure Notes (Signed)
Arterial Line Insertion Start/End12/11/2018 8:29 AM, 01/18/2019 8:34 AM Performed by: Wilburn Cornelia, CRNA, CRNA  Preanesthetic checklist: patient identified, IV checked, site marked, risks and benefits discussed, surgical consent, monitors and equipment checked, pre-op evaluation, timeout performed and anesthesia consent Lidocaine 1% used for infiltration and patient sedated Left, radial was placed Catheter size: 20 G Hand hygiene performed  and maximum sterile barriers used  Allen's test indicative of satisfactory collateral circulation Attempts: 1 Procedure performed without using ultrasound guided technique. Following insertion, Biopatch. Post procedure assessment: normal  Patient tolerated the procedure well with no immediate complications.

## 2019-01-18 NOTE — Progress Notes (Signed)
     [address]       [place] [address]             [phone]       No events  Vitals:   01/18/19 0505 01/18/19 0551  BP: 130/80   Pulse:    Resp:    Temp:  98.1 F (36.7 C)  SpO2: 100%    Alert NAD RRR EWOB  Labs reviewed  53 yo female with 3V CAD OR today for CABG 4

## 2019-01-18 NOTE — Anesthesia Postprocedure Evaluation (Signed)
Anesthesia Post Note  Patient: [name]  Procedure(s) Performed: CORONARY ARTERY BYPASS GRAFTING (CABG) X4 ON PUMP USING LEFT INTERNAL MAMMARY ARTERY AND LEFT GREATER SAPHENOUS VEIN ENDOSCOPICALLY HARVESTED GRAFTS (N/A Chest) Transesophageal Echocardiogram Darden Dates)     Patient location during evaluation: SICU Anesthesia Type: General Level of consciousness: sedated Pain management: pain level controlled Vital Signs Assessment: post-procedure vital signs reviewed and stable Respiratory status: patient remains intubated per anesthesia plan Cardiovascular status: stable Postop Assessment: no apparent nausea or vomiting Anesthetic complications: no    Last Vitals:  Vitals:   01/18/19 0551 01/18/19 1438  BP:  123/61  Pulse:  76  Resp:  18  Temp: 36.7 C   SpO2:  97%    Last Pain:  Vitals:   01/18/19 0551  TempSrc: Oral  PainSc:                  Rula Keniston DAVID

## 2019-01-18 NOTE — Anesthesia Procedure Notes (Signed)
Procedure Name: Intubation Date/Time: 01/18/2019 9:03 AM Performed by: Wilburn Cornelia, CRNA Pre-anesthesia Checklist: Patient identified, Emergency Drugs available, Suction available, Patient being monitored and Timeout performed Patient Re-evaluated:Patient Re-evaluated prior to induction Oxygen Delivery Method: Circle system utilized Preoxygenation: Pre-oxygenation with 100% oxygen Induction Type: IV induction Ventilation: Mask ventilation without difficulty and Oral airway inserted - appropriate to patient size Laryngoscope Size: Mac and 3 Grade View: Grade III Tube type: Oral Tube size: 7.5 mm Number of attempts: 1 Airway Equipment and Method: Stylet Placement Confirmation: ETT inserted through vocal cords under direct vision,  positive ETCO2,  CO2 detector and breath sounds checked- equal and bilateral Secured at: 21 cm Tube secured with: Tape Dental Injury: Teeth and Oropharynx as per pre-operative assessment

## 2019-01-18 NOTE — Brief Op Note (Signed)
01/11/2019 - 01/18/2019  12:47 PM  PATIENT:  [name]  52 y.o. female  PRE-OPERATIVE DIAGNOSIS:  CORONARY ARTERY DISEASE  POST-OPERATIVE DIAGNOSIS:  CORONARY ARTERY DISEASE  PROCEDURE:  Transesophageal Echocardiogram ([place]), CORONARY ARTERY BYPASS GRAFTING (CABG) X4 (LIMA to LAD, SVG to DIAGONAL, SVG to OM, SVG to PLB) ON PUMP USING LEFT INTERNAL MAMMARY ARTERY AND LEFT GREATER SAPHENOUS VEIN ENDOSCOPICALLY HARVESTED GRAFTS   SURGEON:  Surgeon(s) and Role:    Lightfoot, Lucile Crater, MD - Primary  PHYSICIAN ASSISTANT: Lars Pinks PA-C  ANESTHESIA:   general  EBL: Per anesthesia and perfusion record  DRAINS: Chest tubes placed in the pleural and mediastinal spaces   COUNTS CORRECT:  YES  DICTATION: .Dragon Dictation  PLAN OF CARE: Admit to inpatient   PATIENT DISPOSITION:  ICU - intubated and hemodynamically stable.   Delay start of Pharmacological VTE agent (>24hrs) due to surgical blood loss or risk of bleeding: yes  BASELINE WEIGHT: 86.2 kg

## 2019-01-18 NOTE — Progress Notes (Signed)
RT NOTE: RT attempted rapid wean parameters. Patient obtained -31 NIF but could not reach a VC above 300. RT checked for cuff leak with another RT at bedside and cuff leak was negative. RN aware. RT will continue to monitor.

## 2019-01-18 NOTE — Anesthesia Preprocedure Evaluation (Signed)
Anesthesia Evaluation  Patient identified by MRN, date of birth, ID band Patient awake    Reviewed: Allergy & Precautions, NPO status , Patient's Chart, lab work & pertinent test results  Airway Mallampati: I  TM Distance: >3 FB Neck ROM: Full    Dental   Pulmonary sleep apnea , Current Smoker,    Pulmonary exam normal        Cardiovascular hypertension, Pt. on medications + CAD, + Past MI and + Cardiac Stents  Normal cardiovascular exam     Neuro/Psych Anxiety Depression Bipolar Disorder    GI/Hepatic GERD  Medicated and Controlled,  Endo/Other    Renal/GU      Musculoskeletal   Abdominal   Peds  Hematology   Anesthesia Other Findings   Reproductive/Obstetrics                             Anesthesia Physical Anesthesia Plan  ASA: III  Anesthesia Plan: General   Post-op Pain Management:    Induction: Intravenous  PONV Risk Score and Plan: 2 and Ondansetron and Treatment may vary due to age or medical condition  Airway Management Planned: Oral ETT  Additional Equipment: Arterial line, PA Cath, TEE and Ultrasound Guidance Line Placement  Intra-op Plan:   Post-operative Plan: Post-operative intubation/ventilation  Informed Consent: I have reviewed the patients History and Physical, chart, labs and discussed the procedure including the risks, benefits and alternatives for the proposed anesthesia with the patient or authorized representative who has indicated his/her understanding and acceptance.       Plan Discussed with: CRNA and Surgeon  Anesthesia Plan Comments:         Anesthesia Quick Evaluation

## 2019-01-18 NOTE — Progress Notes (Signed)
  Echocardiogram Echocardiogram Transesophageal has been performed.  Burnett Kanaris 01/18/2019, 9:46 AM

## 2019-01-18 NOTE — Op Note (Signed)
[address]       [place] [address]             [phone]                                          01/18/2019 Patient:  [name] Pre-Op Dx:  NSTEMI   3V CAD   HTN   HLP    Post-op Dx:  same Procedure: CABG X 4.  LIMA LAD, RSVG PDA, OM3, D1 Endoscopic greater saphenous vein harvest on the left Intra-operative Transesophageal Echocardiogram  Surgeon and Role:      * Evaan Tidwell, Lucile Crater, MD - Primary    Josie Saunders, PA-C - assisting   Anesthesia  general EBL:  472ml Blood Administration: none Xclamp Time:  63 min Pump Time:  143min  Drains: 23 F blake drain:  R, L, mediastinal  Wires: none Counts: correct   Indications: 53 yo female w/ hx of CAD, s/p PCI, presented with NSTEMI.  LHC showed progression of her disease.  CTS was consulted for surgical revascularization  Findings: Calcified targets, good conduit.  Good flows on vein grafts.  Small OM3.  Good function post bypass  Operative Technique: All invasive lines were placed in pre-op holding.  After the risks, benefits and alternatives were thoroughly discussed, the patient was brought to the operative theatre.  Anesthesia was induced, and the patient was prepped and draped in normal sterile fashion.  An appropriate surgical pause was performed, and pre-operative antibiotics were dosed accordingly.  We began with simultaneous incisions were made along the left leg for harvesting of the greater saphenous vein and the chest for the sternotomy.  In regards to the sternotomy, this was carried down with bovie cautery, and the sternum was divided with a reciprocating saw.  Meticulous hemostasis was obtained.  The left internal thoracic artery was exposed and harvested in in pedicled fashion.  The patient was systemically heparinized, and the artery was divided distally, and placed in a papaverine sponge.    The sternal elevator was removed, and a retractor was placed.  The pericardium was  divided in the midline and fashioned into a cradle with pericardial stitches.   After we confirmed an appropriate ACT, the ascending aorta was cannulated in standard fashion.  The right atrial appendage was used for venous cannulation site.  Cardiopulmonary bypass was initiated, and the heart retractor was placed. The cross clamp was applied, and a dose of anterograde cardioplegia was given with good arrest of the heart.  We moved to the posterior wall of the heart, and found a good target on the PDA.  An arteriotomy was made, and the vein graft was anastomosed to it in an end to side fashion.  Next we exposed the lateral wall, and found a good target on the OM3.  An end to side anastomosis with the vein graft was then created.  Next, we exposed the anterior wall of the heart and identified a good target on D1.   An arteriotomy was created.  The vein was anastomosed in an end to side fashion.  Finally, we exposed a good target on the LAD, and fashioned an end to side anastomosis between it and the LITA.  We began to re-warm, and a re-animation dose of cardioplegia was given.  The heart was de-aired, and the cross clamp was  removed.  Meticulous hemostasis was obtained.    A partial occludding clamp was then placed on the ascending aorta, and we created an end to side anastomosis between it and the proximal vein grafts.  The proximal sites were marked with rings.  Hemostasis was obtained, and we separated from cardiopulmonary bypass without event.the heparin was reversed with protamine.  Chest tubes and wires were placed, and the sternum was re-approximated with with sternal wires.  The soft tissue and skin were re-approximated wth absorbable suture.    The patient tolerated the procedure without any immediate complications, and was transferred to the ICU in guarded condition.  [name] [name]

## 2019-01-18 NOTE — Procedures (Signed)
Extubation Procedure Note  Patient Details:   Name: [name] DOB: 21-Mar-1965 MRN: [id number]   Airway Documentation:    Vent end date: 01/18/19 Vent end time: 06-21-43   Patient has passed Rapid wean. Patient got a NIF -20, VC 800. Patient had a minimal cuff leak and ABG PH 7.29 CO2 55. Both the cuff leak and ABG called to DR. Bartle and give RT the ok to extubate. Patient self-extubated before RT could extubate patient. RT placed patient on 4L [place]. Patient able to say her name. Patient O2 sat remain 100% at this time. No stridor is noted at this time. RT did IS with patient and patient was able to get 250 on that.  RT will continue to monitor as needed. Evaluation  O2 sats: stable throughout Complications: No apparent complications Patient did tolerate procedure well. Bilateral Breath Sounds: Rhonchi, Diminished   Yes  Kelle Darting 01/18/2019, 21-Jun-2043

## 2019-01-18 NOTE — Progress Notes (Signed)
Progress Note  Patient Name: [name] Date of Encounter: 01/18/2019  Primary Cardiologist: Lesleigh Noe, MD   Subjective   No chest pain this am.   Inpatient Medications    Scheduled Meds:  aspirin EC  81 mg Oral Daily   busPIRone  10 mg Oral TID   carvedilol  25 mg Oral BID WC   epinephrine  0-10 mcg/min Intravenous To OR   escitalopram  20 mg Oral Daily   folic acid  1 mg Oral Daily   gabapentin  300 mg Oral TID   heparin-papaverine-plasmalyte irrigation   Irrigation To OR   hydrochlorothiazide  25 mg Oral Daily   insulin   Intravenous To OR   isosorbide mononitrate  60 mg Oral Daily   [facility] Blood Cardioplegia vial (lidocaine/magnesium/mannitol 0.26g-4g-6.4g)   Intracoronary To OR   losartan  50 mg Oral Daily   multivitamin with minerals  1 tablet Oral Daily   nicotine  14 mg Transdermal Daily   pantoprazole  40 mg Oral BH-q7a   phenylephrine  30-200 mcg/min Intravenous To OR   potassium chloride  80 mEq Other To OR   QUEtiapine  100 mg Oral QHS   rosuvastatin  40 mg Oral q1800   sodium chloride flush  3 mL Intravenous Q12H   sodium chloride flush  3 mL Intravenous Q12H   thiamine  100 mg Oral Daily   tranexamic acid  15 mg/kg Intravenous To OR   tranexamic acid  2 mg/kg Intracatheter To OR   Continuous Infusions:  sodium chloride     sodium chloride     cefUROXime (ZINACEF)  IV     cefUROXime (ZINACEF)  IV     dexmedetomidine     heparin 30,000 units/NS 1000 mL solution for CELLSAVER     heparin Stopped (01/18/19 0736)   lactated ringers     milrinone     nitroGLYCERIN     norepinephrine     tranexamic acid (CYKLOKAPRON) infusion ([facility])     vancomycin     PRN Meds: sodium chloride, sodium chloride, acetaminophen, acetaminophen, albuterol, ALPRAZolam, diazepam, nitroGLYCERIN, ondansetron (ZOFRAN) IV, sodium chloride flush, sodium chloride flush, temazepam   Vital Signs    Vitals:   01/17/19 2337  01/17/19 2338 01/18/19 0505 01/18/19 0551  BP:  139/70 130/80   Pulse: 76     Resp:      Temp: 97.9 F (36.6 C)   98.1 F (36.7 C)  TempSrc: Oral   Oral  SpO2:   100%   Weight:    86.2 kg  Height:        Intake/Output Summary (Last 24 hours) at 01/18/2019 0745 Last data filed at 01/17/2019 2334 Gross per 24 hour  Intake 2534.75 ml  Output 2050 ml  Net 484.75 ml   Last 3 Weights 01/18/2019 01/17/2019 01/16/2019  Weight (lbs) 190 lb 192 lb 6.4 oz 191 lb 1.6 oz  Weight (kg) 86.183 kg 87.272 kg 86.682 kg  Some encounter information is confidential and restricted. Go to Review Flowsheets activity to see all data.      Telemetry    sinus - Personally Reviewed  ECG    No new tracing.  Physical Exam   General: Well developed, well nourished, NAD  HEENT: OP clear, mucus membranes moist  SKIN: warm, dry. No rashes. Neuro: No focal deficits  Musculoskeletal: Muscle strength 5/5 all ext  Psychiatric: Mood and affect normal  Neck: No JVD, no carotid bruits, no thyromegaly,  no lymphadenopathy.  Lungs:Clear bilaterally, no wheezes, rhonci, crackles Cardiovascular: Regular rate and rhythm. No murmurs, gallops or rubs. Abdomen:Soft. Bowel sounds present. Non-tender.  Extremities: No lower extremity edema. Pulses are 2 + in the bilateral DP/PT.   Labs    High Sensitivity Troponin:   Recent Labs  Lab 01/11/19 2313 01/12/19 0212 01/12/19 1051 01/12/19 1908 01/12/19 2111  TROPONINIHS 12 32* 191* 515* 572*      Chemistry Recent Labs  Lab 01/16/19 0752 01/17/19 1626 01/18/19 0501  NA 141 141 142  K 3.7 3.5 3.7  CL 107 106 108  CO2 GLUCOSE 128* 124* 111*  BUN CREATININE 0.80 0.87 0.76  CALCIUM 8.8* 9.1 9.2  PROT  --  6.1*  --   ALBUMIN  --  3.1*  --   AST  --  54*  --   ALT  --  49*  --   ALKPHOS  --  61  --   BILITOT  --  0.1*  --   GFRNONAA >60 >60 >60  GFRAA >60 >60 >60  ANIONGAP Hematology Recent Labs  Lab 01/16/19 0752  01/17/19 0016 01/18/19 0501  WBC 8.6 13.0* 10.4  RBC 4.62 4.30 4.39  HGB 13.2 12.2 12.3  HCT 41.7 38.0 39.5  MCV 90.3 88.4 90.0  MCH 28.6 28.4 28.0  MCHC 31.7 32.1 31.1  RDW 13.2 13.1 13.3  PLT 283 280 287    BNPNo results for input(s): BNP, PROBNP in the last 168 hours.   DDimer No results for input(s): DDIMER in the last 168 hours.   Radiology    ECHOCARDIOGRAM COMPLETE  Result Date: 01/16/2019   ECHOCARDIOGRAM REPORT   Patient Name:   [name] Date of Exam: 01/16/2019 Medical Rec #:  [id number]    Height:       61.0 in Accession #:    [id number]   Weight:       191.1 lb Date of Birth:  1965-06-15    BSA:          1.85 m Patient Age:    53 years     BP:           156/83 mmHg Patient Gender: F            HR:           90 bpm. Exam Location:  Inpatient Procedure: 2D Echo, Color Doppler and Cardiac Doppler Indications:    Pre-CABG  History:        Patient has prior history of Echocardiogram examinations, most                 recent 01/31/2018. CHF, NSTEMI; Risk Factors:Hypertension,                 Dyslipidemia and Sleep Apnea.  Sonographer:    Irving Burton Senior RDCS Referring Phys: [id number] HARRELL O LIGHTFOOT IMPRESSIONS  1. Left ventricular ejection fraction, by visual estimation, is 65 to 70%. The left ventricle has hyperdynamic function. There is moderately increased left ventricular hypertrophy.  2. Left ventricular diastolic parameters are consistent with Grade I diastolic dysfunction (impaired relaxation).  3. The left ventricle has no regional wall motion abnormalities.  4. Global right ventricle has normal systolic function.The right ventricular size is normal. No increase in right ventricular wall thickness.  5. Left atrial size was normal.  6. Right atrial size was normal.  7. The mitral valve is  normal in structure. No evidence of mitral valve regurgitation. No evidence of mitral stenosis.  8. The tricuspid valve is normal in structure. Tricuspid valve regurgitation is not demonstrated.   9. The aortic valve was not well visualized. Aortic valve regurgitation is not visualized. No evidence of aortic valve sclerosis or stenosis. 10. TR signal is inadequate for assessing pulmonary artery systolic pressure. FINDINGS  Left Ventricle: Left ventricular ejection fraction, by visual estimation, is 65 to 70%. The left ventricle has hyperdynamic function. The left ventricle has no regional wall motion abnormalities. The left ventricular internal cavity size was the left ventricle is normal in size. There is moderately increased left ventricular hypertrophy. Left ventricular diastolic parameters are consistent with Grade I diastolic dysfunction (impaired relaxation). Right Ventricle: The right ventricular size is normal. No increase in right ventricular wall thickness. Global RV systolic function is has normal systolic function. Left Atrium: Left atrial size was normal in size. Right Atrium: Right atrial size was normal in size Pericardium: There is no evidence of pericardial effusion. Mitral Valve: The mitral valve is normal in structure. No evidence of mitral valve stenosis by observation. No evidence of mitral valve regurgitation. Tricuspid Valve: The tricuspid valve is normal in structure. Tricuspid valve regurgitation is not demonstrated. Aortic Valve: The aortic valve was not well visualized. Aortic valve regurgitation is not visualized. The aortic valve is structurally normal, with no evidence of sclerosis or stenosis. Pulmonic Valve: The pulmonic valve was normal in structure. Pulmonic valve regurgitation is not visualized. Aorta: The aortic root is normal in size and structure. Venous: The inferior vena cava was not well visualized. IAS/Shunts: No atrial level shunt detected by color flow Doppler.  LEFT VENTRICLE PLAX 2D LVIDd:         4.50 cm  Diastology LVIDs:         3.10 cm  LV e' lateral:   9.46 cm/s LV PW:         0.90 cm  LV E/e' lateral: 10.8 LV IVS:        1.70 cm  LV e' medial:    7.40 cm/s  LVOT diam:     1.90 cm  LV E/e' medial:  13.8 LV SV:         55 ml LV SV Index:   27.62 LVOT Area:     2.84 cm  RIGHT VENTRICLE RV S prime:     13.90 cm/s TAPSE (M-mode): 2.0 cm LEFT ATRIUM             Index       RIGHT ATRIUM           Index LA diam:        4.00 cm 2.16 cm/m  RA Area:     13.30 cm LA Vol (A2C):   52.8 ml 28.50 ml/m RA Volume:   30.90 ml  16.68 ml/m LA Vol (A4C):   48.5 ml 26.18 ml/m LA Biplane Vol: 52.4 ml 28.29 ml/m  AORTIC VALVE LVOT Vmax:   139.00 cm/s LVOT Vmean:  92.500 cm/s LVOT VTI:    0.261 m  AORTA Ao Root diam: 2.40 cm Ao Asc diam:  2.70 cm MITRAL VALVE MV Area (PHT): 3.12 cm              SHUNTS MV PHT:        70.47 msec            Systemic VTI:  0.26 m MV Decel Time: 243 msec  Systemic Diam: 1.90 cm MV E velocity: 102.00 cm/s 103 cm/s MV A velocity: 130.00 cm/s 70.3 cm/s MV E/A ratio:  0.78        1.5  Marca Ancona MD Electronically signed by Marca Ancona MD Signature Date/Time: 01/16/2019/4:29:42 PM    Final    VAS US DOPPLER PRE CABG  Result Date: 01/17/2019 PREOPERATIVE VASCULAR EVALUATION  Indications:  Pre CABG. Risk Factors: Hypertension. Performing Technologist: Levin Bacon RDMS, RVT  Examination Guidelines: A complete evaluation includes B-mode imaging, spectral Doppler, color Doppler, and power Doppler as needed of all accessible portions of each vessel. Bilateral testing is considered an integral part of a complete examination. Limited examinations for reoccurring indications may be performed as noted.  Right Carotid Findings: +----------+--------+--------+--------+------------+--------+             PSV cm/s EDV cm/s Stenosis Describe     Comments  +----------+--------+--------+--------+------------+--------+  CCA Prox   130      17                                       +----------+--------+--------+--------+------------+--------+  CCA Distal 76       11                                        +----------+--------+--------+--------+------------+--------+  ICA Prox   76       26       1-39%    heterogenous           +----------+--------+--------+--------+------------+--------+  ICA Distal 124      24                                       +----------+--------+--------+--------+------------+--------+  ECA        107      10                                       +----------+--------+--------+--------+------------+--------+ Portions of this table do not appear on this page. +----------+--------+-------+----------------+------------+             PSV cm/s EDV cms Describe         Arm Pressure  +----------+--------+-------+----------------+------------+  Subclavian 125              Multiphasic, WNL 173           +----------+--------+-------+----------------+------------+ +---------+--------+--+--------+--+---------+  Vertebral PSV cm/s 57 EDV cm/s 13 Antegrade  +---------+--------+--+--------+--+---------+ Left Carotid Findings: +----------+--------+--------+--------+------------+--------+             PSV cm/s EDV cm/s Stenosis Describe     Comments  +----------+--------+--------+--------+------------+--------+  CCA Prox   118      20                                       +----------+--------+--------+--------+------------+--------+  CCA Distal 108      19                                       +----------+--------+--------+--------+------------+--------+  ICA Prox   137      32       1-39%    heterogenous           +----------+--------+--------+--------+------------+--------+  ICA Distal 118      27                                       +----------+--------+--------+--------+------------+--------+  ECA        99       12                                       +----------+--------+--------+--------+------------+--------+ +----------+--------+--------+----------------+------------+  Subclavian PSV cm/s EDV cm/s Describe         Arm Pressure  +----------+--------+--------+----------------+------------+             93                 Multiphasic, WNL 171           +----------+--------+--------+----------------+------------+ +---------+--------+--+--------+--+---------+  Vertebral PSV cm/s 61 EDV cm/s 20 Antegrade  +---------+--------+--+--------+--+---------+  ABI Findings: +--------+------------------+-----+---------+--------+  Right    Rt Pressure (mmHg) Index Waveform  Comment   +--------+------------------+-----+---------+--------+  Brachial 173                      triphasic           +--------+------------------+-----+---------+--------+  ATA      173                1.00  triphasic           +--------+------------------+-----+---------+--------+  PTA      179                1.03  triphasic           +--------+------------------+-----+---------+--------+ +--------+------------------+-----+---------+-------+  Left     Lt Pressure (mmHg) Index Waveform  Comment  +--------+------------------+-----+---------+-------+  Brachial 171                      triphasic          +--------+------------------+-----+---------+-------+  ATA      171                0.99  triphasic          +--------+------------------+-----+---------+-------+  PTA      171                0.99  triphasic          +--------+------------------+-----+---------+-------+  Right Doppler Findings: +--------+--------+-----+---------+--------+  Site     Pressure Index Doppler   Comments  +--------+--------+-----+---------+--------+  Brachial 173            triphasic           +--------+--------+-----+---------+--------+  Radial                  triphasic           +--------+--------+-----+---------+--------+  Ulnar                   triphasic           +--------+--------+-----+---------+--------+  Left Doppler Findings: +--------+--------+-----+---------+--------+  Site     Pressure Index Doppler   Comments  +--------+--------+-----+---------+--------+  Brachial 171  triphasic           +--------+--------+-----+---------+--------+  Radial                   triphasic           +--------+--------+-----+---------+--------+  Ulnar                   triphasic           +--------+--------+-----+---------+--------+  Summary: Right Carotid: Velocities in the right ICA are consistent with a 1-39% stenosis. Left Carotid: Velocities in the left ICA are consistent with a 1-39% stenosis. Right ABI: Resting right ankle-brachial index is within normal range. No evidence of significant right lower extremity arterial disease. Left ABI: Resting left ankle-brachial index is within normal range. No evidence of significant left lower extremity arterial disease. Right Upper Extremity: Doppler waveforms remain within normal limits with right radial compression. Doppler waveforms decrease 50% with right ulnar compression. Left Upper Extremity: Doppler waveforms remain within normal limits with left radial compression. Doppler waveform obliterate with left ulnar compression.  Electronically signed by Coral Else MD on 01/16/2019 at 5:20:00 PM.    Final (Updating)     Cardiac Studies   Cath: 01/15/19    Ost RCA to Prox RCA lesion is 5% stenosed.  RPDA lesion is 95% stenosed.  Dist RCA lesion is 80% stenosed.  Prox RCA lesion is 70% stenosed.  Mid RCA-1 lesion is 80% stenosed.  Mid RCA-2 lesion is 90% stenosed.  Mid RCA to Dist RCA lesion is 40% stenosed.  1st Mrg lesion is 50% stenosed.  3rd Mrg lesion is 80% stenosed.  Prox LAD lesion is 50% stenosed.  Mid LAD lesion is 50% stenosed.  1st Diag lesion is 80% stenosed.  Dist LAD lesion is 70% stenosed.  Mid Cx to Dist Cx lesion is 90% stenosed.  The left ventricular systolic function is normal.  LV end diastolic pressure is normal.   Significant progressive multivessel CAD with 50% proximal LAD stenosis followed by proximal small to mid stenosis of 50% with diffuse 80% stenosis in the first diagonal vessel and 70% distal LAD stenosis; left circumflex with 50% stenosis in a large OM1 vessel, and with  focal distal restenosis in the AV groove stent which enters into the marginal branch that has 90% focal narrowing in the region of the distal small AV groove circumflex with 80% stenosis; and large dominant RCA with patent proximal RCA stent but with progressive mid distal disease with bifurcation stenosis in the distal RCA extending into the large PDA vessel.  Preserved global LV contractility with EF estimate at 60%; LVEDP 13 mm.  RECOMMENDATION: Will ask colleagues to review.  With progressive disease particularly in the RCA as well as progressive concomitant CAD involving the LAD and circumflex consider surgical consultation for  surgical revascularization.  We will hold off on P2Y12 inhibition pending possible surgical evaluation.  Alternatively, multi-vesssel intervention to the LCx in-stent restenosis and RCA can be considered.  Aggressive lipid-lowering therapy with target LDL in the 50s or below.  Smoking cessation is imperative.  Diagnostic Dominance: Right   TTE: 01/16/19  IMPRESSIONS    1. Left ventricular ejection fraction, by visual estimation, is 65 to 70%. The left ventricle has hyperdynamic function. There is moderately increased left ventricular hypertrophy.  2. Left ventricular diastolic parameters are consistent with Grade I diastolic dysfunction (impaired relaxation).  3. The left ventricle has no regional wall motion abnormalities.  4. Global right ventricle has normal  systolic function.The right ventricular size is normal. No increase in right ventricular wall thickness.  5. Left atrial size was normal.  6. Right atrial size was normal.  7. The mitral valve is normal in structure. No evidence of mitral valve regurgitation. No evidence of mitral stenosis.  8. The tricuspid valve is normal in structure. Tricuspid valve regurgitation is not demonstrated.  9. The aortic valve was not well visualized. Aortic valve regurgitation is not visualized. No evidence of aortic  valve sclerosis or stenosis. 10. TR signal is inadequate for assessing pulmonary artery systolic pressure.  Patient Profile     53 y.o. female with known CAD and history of RCA stenting and left circumflex stenting in 2012, presenting with non-STEMI.  Patient with history of alcohol and tobacco abuse as well as medical noncompliance.  Assessment & Plan    1.  Non-STEMI: Cardiac catheterization showed multivessel coronary artery disease. Plans for CABG today. Will continue ASA, beta blocker and statin.  She will need to be discharged on Plavix given presentation with ACS.   2.  Mixed hyperlipidemia: continue statin  3.  Hypertension: BP controlled.   4.  Tobacco abuse: Heavy smoker.  Cessation counseling done.  For questions or updates, please contact [facility] Please consult [email or web address] for contact info under      Signed, Verne Carrowhristopher Elija Mccamish, MD  01/18/2019, 7:45 AM

## 2019-01-19 ENCOUNTER — Inpatient Hospital Stay (HOSPITAL_COMMUNITY): Payer: Self-pay

## 2019-01-19 DIAGNOSIS — Z951 Presence of aortocoronary bypass graft: Secondary | ICD-10-CM

## 2019-01-19 HISTORY — DX: Presence of aortocoronary bypass graft: Z95.1

## 2019-01-19 LAB — BASIC METABOLIC PANEL
Anion gap: 8 (ref 5–15)
Anion gap: 11 (ref 5–15)
BUN: 8 mg/dL (ref 6–20)
BUN: 10 mg/dL (ref 6–20)
CO2: 25 mmol/L (ref 22–32)
CO2: 24 mmol/L (ref 22–32)
Calcium: 8.5 mg/dL — ABNORMAL LOW (ref 8.9–10.3)
Calcium: 8.7 mg/dL — ABNORMAL LOW (ref 8.9–10.3)
Chloride: 108 mmol/L (ref 98–111)
Chloride: 102 mmol/L (ref 98–111)
Creatinine, Ser: 0.79 mg/dL (ref 0.44–1.00)
Creatinine, Ser: 1.03 mg/dL — ABNORMAL HIGH (ref 0.44–1.00)
GFR calc Af Amer: >60 mL/min (ref >60)
GFR calc Af Amer: >60 mL/min (ref >60)
GFR calc non Af Amer: >60 mL/min (ref >60)
GFR calc non Af Amer: >60 mL/min (ref >60)
Glucose, Bld: 128 mg/dL — ABNORMAL HIGH (ref 70–99)
Glucose, Bld: 120 mg/dL — ABNORMAL HIGH (ref 70–99)
Potassium: 4.3 mmol/L (ref 3.5–5.1)
Potassium: 4.4 mmol/L (ref 3.5–5.1)
Sodium: 141 mmol/L (ref 135–145)
Sodium: 137 mmol/L (ref 135–145)

## 2019-01-19 LAB — GLUCOSE, CAPILLARY
Glucose-Capillary: 107 mg/dL — ABNORMAL HIGH (ref 70–99)
Glucose-Capillary: 109 mg/dL — ABNORMAL HIGH (ref 70–99)
Glucose-Capillary: 120 mg/dL — ABNORMAL HIGH (ref 70–99)
Glucose-Capillary: 124 mg/dL — ABNORMAL HIGH (ref 70–99)
Glucose-Capillary: 92 mg/dL (ref 70–99)
Glucose-Capillary: 98 mg/dL (ref 70–99)

## 2019-01-19 LAB — CBC
HCT: 32.5 % — ABNORMAL LOW (ref 36.0–46.0)
HCT: 33.6 % — ABNORMAL LOW (ref 36.0–46.0)
Hemoglobin: 10.2 g/dL — ABNORMAL LOW (ref 12.0–15.0)
Hemoglobin: 10.4 g/dL — ABNORMAL LOW (ref 12.0–15.0)
MCH: 28.4 pg (ref 26.0–34.0)
MCH: 28.7 pg (ref 26.0–34.0)
MCHC: 31.4 g/dL (ref 30.0–36.0)
MCHC: 31 g/dL (ref 30.0–36.0)
MCV: 90.5 fL (ref 80.0–100.0)
MCV: 92.6 fL (ref 80.0–100.0)
Platelets: 227 10*3/uL (ref 150–400)
Platelets: 231 10*3/uL (ref 150–400)
RBC: 3.59 MIL/uL — ABNORMAL LOW (ref 3.87–5.11)
RBC: 3.63 MIL/uL — ABNORMAL LOW (ref 3.87–5.11)
RDW: 13.4 % (ref 11.5–15.5)
RDW: 13.6 % (ref 11.5–15.5)
WBC: 13.1 10*3/uL — ABNORMAL HIGH (ref 4.0–10.5)
WBC: 14.5 10*3/uL — ABNORMAL HIGH (ref 4.0–10.5)
nRBC: 0 % (ref 0.0–0.2)
nRBC: 0 % (ref 0.0–0.2)

## 2019-01-19 LAB — MAGNESIUM
Magnesium: 2.4 mg/dL (ref 1.7–2.4)
Magnesium: 2.3 mg/dL (ref 1.7–2.4)

## 2019-01-19 MED ORDER — FUROSEMIDE 10 MG/ML IJ SOLN: 20.0000 mg | Freq: Once | INTRAMUSCULAR | Status: DC

## 2019-01-19 MED ORDER — ENOXAPARIN SODIUM 40 MG/0.4ML ~~LOC~~ SOLN
40.0000 mg | Freq: Every day | SUBCUTANEOUS | Status: DC
  Administered 2019-01-19 – 2019-01-24 (×6): 40 mg via SUBCUTANEOUS
  Filled 2019-01-19 (×6): qty 0.4

## 2019-01-19 MED ORDER — SODIUM CHLORIDE 0.9 % IV SOLN: 250.0000 mL | INTRAVENOUS | Status: DC | PRN

## 2019-01-19 MED ORDER — SODIUM CHLORIDE 0.9% FLUSH
3.0000 mL | INTRAVENOUS | Status: DC | PRN
  Administered 2019-01-23: 3 mL via INTRAVENOUS

## 2019-01-19 MED ORDER — FUROSEMIDE 40 MG PO TABS
40.0000 mg | ORAL_TABLET | Freq: Every day | ORAL | Status: DC
  Administered 2019-01-19 – 2019-01-23 (×5): 40 mg via ORAL
  Filled 2019-01-19 (×4): qty 1

## 2019-01-19 MED ORDER — FUROSEMIDE 10 MG/ML IJ SOLN
20.0000 mg | Freq: Two times a day (BID) | INTRAMUSCULAR | Status: DC
  Administered 2019-01-19: 20 mg via INTRAVENOUS
  Filled 2019-01-19: qty 2

## 2019-01-19 MED ORDER — FUROSEMIDE 40 MG PO TABS
40.0000 mg | ORAL_TABLET | Freq: Every day | ORAL | Status: DC
  Filled 2019-01-19: qty 1

## 2019-01-19 MED ORDER — INSULIN ASPART 100 UNIT/ML ~~LOC~~ SOLN
0.0000 [IU] | SUBCUTANEOUS | Status: DC
  Administered 2019-01-19: 2 [IU] via SUBCUTANEOUS

## 2019-01-19 MED ORDER — POTASSIUM CHLORIDE CRYS ER 20 MEQ PO TBCR
40.0000 meq | EXTENDED_RELEASE_TABLET | Freq: Every day | ORAL | Status: DC
  Administered 2019-01-19 – 2019-01-25 (×7): 40 meq via ORAL
  Filled 2019-01-19 (×7): qty 2

## 2019-01-19 MED ORDER — POTASSIUM CHLORIDE CRYS ER 20 MEQ PO TBCR
20.0000 meq | EXTENDED_RELEASE_TABLET | Freq: Once | ORAL | Status: AC
  Administered 2019-01-19: 20 meq via ORAL
  Filled 2019-01-19: qty 1

## 2019-01-19 MED ORDER — INSULIN ASPART 100 UNIT/ML ~~LOC~~ SOLN: 0.0000 [IU] | SUBCUTANEOUS | Status: DC

## 2019-01-19 MED ORDER — ~~LOC~~ CARDIAC SURGERY, PATIENT & FAMILY EDUCATION
Freq: Once | Status: AC
  Administered 2019-01-19: 16:00:00

## 2019-01-19 MED ORDER — SODIUM CHLORIDE 0.9% FLUSH
3.0000 mL | Freq: Two times a day (BID) | INTRAVENOUS | Status: DC
  Administered 2019-01-20 – 2019-01-24 (×9): 3 mL via INTRAVENOUS

## 2019-01-19 MED ORDER — INSULIN ASPART 100 UNIT/ML ~~LOC~~ SOLN
0.0000 [IU] | SUBCUTANEOUS | Status: DC
  Administered 2019-01-19 – 2019-01-21 (×3): 2 [IU] via SUBCUTANEOUS

## 2019-01-19 NOTE — Progress Notes (Signed)
Progress Note  Patient Name: [name] Date of Encounter: 01/19/2019  Primary Cardiologist: Lesleigh NoeHenry W Smith III, MD   Subjective   Extubated and awake. No complaints.   Inpatient Medications    Scheduled Meds:  acetaminophen  1,000 mg Oral Q6H   Or   acetaminophen (TYLENOL) oral liquid 160 mg/5 mL  1,000 mg Per Tube Q6H   aspirin EC  325 mg Oral Daily   Or   aspirin  324 mg Per Tube Daily   bisacodyl  10 mg Oral Daily   Or   bisacodyl  10 mg Rectal Daily   busPIRone  10 mg Oral TID   Chlorhexidine Gluconate Cloth  6 each Topical Daily   docusate sodium  200 mg Oral Daily   escitalopram  20 mg Oral Daily   folic acid  1 mg Oral Daily   gabapentin  300 mg Oral TID   insulin aspart  0-24 Units Subcutaneous Q4H   metoprolol tartrate  12.5 mg Oral BID   Or   metoprolol tartrate  12.5 mg Per Tube BID   multivitamin with minerals  1 tablet Oral Daily   nicotine  14 mg Transdermal Daily   [START ON 01/20/2019] pantoprazole  40 mg Oral Daily   QUEtiapine  100 mg Oral QHS   rosuvastatin  40 mg Oral q1800   sodium chloride flush  3 mL Intravenous Q12H   thiamine  100 mg Oral Daily   Continuous Infusions:  sodium chloride     sodium chloride     sodium chloride     albumin human 12.5 g (01/18/19 1500)   cefUROXime (ZINACEF)  IV Stopped (01/18/19 2157)   dexmedetomidine (PRECEDEX) IV infusion Stopped (01/19/19 [id number])   lactated ringers     lactated ringers     lactated ringers 20 mL/hr (01/18/19 1430)   niCARDipine Stopped (01/18/19 1837)   nitroGLYCERIN Stopped (01/18/19 2314)   norepinephrine (LEVOPHED) Adult infusion     phenylephrine (NEO-SYNEPHRINE) Adult infusion 0 mcg/min (01/18/19 1430)   PRN Meds: sodium chloride, albumin human, ALPRAZolam, lactated ringers, levalbuterol, metoprolol tartrate, midazolam, morphine injection, ondansetron (ZOFRAN) IV, sodium chloride flush, traMADol   Vital Signs    Vitals:   01/19/19 0500  01/19/19 0600 01/19/19 0700 01/19/19 0749  BP: 113/70 101/65 106/72   Pulse: 87 83 81   Resp: 19 (!) 22 18   Temp:    98.7 F (37.1 C)  TempSrc:    Oral  SpO2: 99% 98% 97%   Weight: 91.9 kg     Height:        Intake/Output Summary (Last 24 hours) at 01/19/2019 0812 Last data filed at 01/19/2019 0700 Gross per 24 hour  Intake 4630.09 ml  Output 2315 ml  Net 2315.09 ml   Last 3 Weights 01/19/2019 01/18/2019 01/18/2019  Weight (lbs) 202 lb 9.6 oz 190 lb 0.6 oz 190 lb  Weight (kg) 91.9 kg 86.2 kg 86.183 kg  Some encounter information is confidential and restricted. Go to Review Flowsheets activity to see all data.      Telemetry    Sinus - Personally Reviewed  ECG    Sinus, PACs, repolarization abn  Physical Exam   General: Well developed, well nourished, NAD  HEENT: OP clear, mucus membranes moist  SKIN: warm, dry. No rashes. Neuro: No focal deficits  Musculoskeletal: Muscle strength 5/5 all ext  Psychiatric: Mood and affect normal  Neck: No JVD, no carotid bruits, no thyromegaly, no lymphadenopathy.  Lungs:Clear bilaterally,  no wheezes, rhonci, crackles Cardiovascular: Regular rate and rhythm. No murmurs, gallops or rubs. Abdomen:Soft. Bowel sounds present. Non-tender.  Extremities: No lower extremity edema. Pulses are 2 + in the bilateral DP/PT.  Labs    High Sensitivity Troponin:   Recent Labs  Lab 01/11/19 2313 01/12/19 0212 01/12/19 1051 01/12/19 1908 01/12/19 2111  TROPONINIHS 12 32* 191* 515* 572*      Chemistry Recent Labs  Lab 01/17/19 1626 01/18/19 0501 01/18/19 2027 01/18/19 2033 01/18/19 2209 01/19/19 0353  NA 141 142 142 141 143 141  K 3.5 3.7 4.7 4.9 4.3 4.3  CL 106 108 111  --   --  108  CO2 25 26 25   --   --  25  GLUCOSE 124* 111* 122*  --   --  128*  BUN 10 8 7   --   --  8  CREATININE 0.87 0.76 0.74  --   --  0.79  CALCIUM 9.1 9.2 8.2*  --   --  8.5*  PROT 6.1*  --   --   --   --   --   ALBUMIN 3.1*  --   --   --   --   --     AST 54*  --   --   --   --   --   ALT 49*  --   --   --   --   --   ALKPHOS 61  --   --   --   --   --   BILITOT 0.1*  --   --   --   --   --   GFRNONAA >60 >60 >60  --   --  >60  GFRAA >60 >60 >60  --   --  >60  ANIONGAP 10 8 6   --   --  8     Hematology Recent Labs  Lab 01/18/19 1452 01/18/19 2027 01/18/19 2033 01/18/19 2209 01/19/19 0353  WBC 12.1* 13.4*  --   --  13.1*  RBC 3.40* 3.90  --   --  3.59*  HGB 9.7* 11.1* 11.6* 10.9* 10.2*  HCT 31.2* 36.1 34.0* 32.0* 32.5*  MCV 91.8 92.6  --   --  90.5  MCH 28.5 28.5  --   --  28.4  MCHC 31.1 30.7  --   --  31.4  RDW 13.4 13.5  --   --  13.4  PLT 136* 246  --   --  227    BNPNo results for input(s): BNP, PROBNP in the last 168 hours.   DDimer No results for input(s): DDIMER in the last 168 hours.   Radiology    DG Chest Port 1 View  Result Date: 01/18/2019 CLINICAL DATA:  Status post CABG EXAM: PORTABLE CHEST 1 VIEW COMPARISON:  01/11/2019 FINDINGS: Interval postoperative findings of median sternotomy and CABG. Extensive support apparatus includes endotracheal tube, tip positioned just above the carina, esophagogastric tube with tip below the diaphragm and side port near the gastroesophageal junction, and right neck pulmonary arterial catheter, tip directed peripherally into the right lower or right middle lobe. Bilateral chest and mediastinal drainage tubes. No significant pneumothorax. Cardiomegaly. IMPRESSION: 1.  Interval postoperative findings of median sternotomy and CABG. 2. Extensive support apparatus includes endotracheal tube, tip positioned just above the carina, esophagogastric tube with tip below the diaphragm and side port near the gastroesophageal junction, and right neck pulmonary arterial catheter, tip directed peripherally into the right lower or  right middle lobe. 3. Consider advancement of esophagogastric tube to ensure subdiaphragmatic positioning of side-port. 4. No significant pneumothorax or other acute  abnormality of the lungs. Electronically Signed   By: Lauralyn Primes M.D.   On: 01/18/2019 15:03   ECHO INTRAOPERATIVE TEE  Result Date: 01/18/2019  *INTRAOPERATIVE TRANSESOPHAGEAL REPORT *  Patient Name:   [name]   Date of Exam: 01/18/2019 Medical Rec #:  [id number]      Height:       61.0 in Accession #:    [id number]     Weight:       190.0 lb Date of Birth:  1965-11-18      BSA:          1.85 m Patient Age:    53 years       BP:           130/80 mmHg Patient Gender: F              HR:           80 bpm. Exam Location:  Anesthesiology Transesophogeal exam was perform intraoperatively during surgical procedure. Patient was closely monitored under general anesthesia during the entirety of examination. Indications:     Coronary artery disease Sonographer:     Tonia Ghent RDCS Performing Phys: [id number] Eliezer Lofts LIGHTFOOT Diagnosing Phys: Arta Bruce MD Complications: No known complications during this procedure. POST-OP IMPRESSIONS - Left Ventricle: The left ventricle is unchanged from pre-bypass. - Aorta: The aorta appears unchanged from pre-bypass. - Aortic Valve: The aortic valve appears unchanged from pre-bypass. - Mitral Valve: There is mild regurgitation. PRE-OP FINDINGS  Left Ventricle: The left ventricle has normal systolic function, with an ejection fraction of 55-60%. The cavity size was normal. There is concentric left ventricular hypertrophy. Right Ventricle: The right ventricle has normal systolic function. The cavity was normal. There is no increase in right ventricular wall thickness. Left Atrium: Left atrial size was normal in size. The left atrial appendage is well visualized and there is no evidence of thrombus present. Right Atrium: Right atrial size was normal in size. Right atrial pressure is estimated at 10 mmHg. Interatrial Septum: No atrial level shunt detected by color flow Doppler. Pericardium: There is no evidence of pericardial effusion. Mitral Valve: The mitral valve is normal  in structure. No thickening of the mitral valve leaflet. No calcification of the mitral valve leaflet. Mitral valve regurgitation is not visualized by color flow Doppler. Tricuspid Valve: The tricuspid valve was normal in structure. Tricuspid valve regurgitation was not visualized by color flow Doppler. Aortic Valve: The aortic valve is normal in structure. Aortic valve regurgitation was not visualized by color flow Doppler. There is no stenosis of the aortic valve. Pulmonic Valve: The pulmonic valve was normal in structure. Pulmonic valve regurgitation is not visualized by color flow Doppler.  Arta Bruce MD Electronically signed by Arta Bruce MD Signature Date/Time: 01/18/2019/3:01:26 PM    Final     Cardiac Studies   Cath: 01/15/19    Ost RCA to Prox RCA lesion is 5% stenosed.  RPDA lesion is 95% stenosed.  Dist RCA lesion is 80% stenosed.  Prox RCA lesion is 70% stenosed.  Mid RCA-1 lesion is 80% stenosed.  Mid RCA-2 lesion is 90% stenosed.  Mid RCA to Dist RCA lesion is 40% stenosed.  1st Mrg lesion is 50% stenosed.  3rd Mrg lesion is 80% stenosed.  Prox LAD lesion is 50% stenosed.  Mid LAD lesion is 50% stenosed.  1st Diag lesion is 80% stenosed.  Dist LAD lesion is 70% stenosed.  Mid Cx to Dist Cx lesion is 90% stenosed.  The left ventricular systolic function is normal.  LV end diastolic pressure is normal.   Significant progressive multivessel CAD with 50% proximal LAD stenosis followed by proximal small to mid stenosis of 50% with diffuse 80% stenosis in the first diagonal vessel and 70% distal LAD stenosis; left circumflex with 50% stenosis in a large OM1 vessel, and with focal distal restenosis in the AV groove stent which enters into the marginal branch that has 90% focal narrowing in the region of the distal small AV groove circumflex with 80% stenosis; and large dominant RCA with patent proximal RCA stent but with progressive mid distal disease with bifurcation  stenosis in the distal RCA extending into the large PDA vessel.  Preserved global LV contractility with EF estimate at 60%; LVEDP 13 mm.  RECOMMENDATION: Will ask colleagues to review.  With progressive disease particularly in the RCA as well as progressive concomitant CAD involving the LAD and circumflex consider surgical consultation for  surgical revascularization.  We will hold off on P2Y12 inhibition pending possible surgical evaluation.  Alternatively, multi-vesssel intervention to the LCx in-stent restenosis and RCA can be considered.  Aggressive lipid-lowering therapy with target LDL in the 50s or below.  Smoking cessation is imperative.  Diagnostic Dominance: Right   TTE: 01/16/19  IMPRESSIONS    1. Left ventricular ejection fraction, by visual estimation, is 65 to 70%. The left ventricle has hyperdynamic function. There is moderately increased left ventricular hypertrophy.  2. Left ventricular diastolic parameters are consistent with Grade I diastolic dysfunction (impaired relaxation).  3. The left ventricle has no regional wall motion abnormalities.  4. Global right ventricle has normal systolic function.The right ventricular size is normal. No increase in right ventricular wall thickness.  5. Left atrial size was normal.  6. Right atrial size was normal.  7. The mitral valve is normal in structure. No evidence of mitral valve regurgitation. No evidence of mitral stenosis.  8. The tricuspid valve is normal in structure. Tricuspid valve regurgitation is not demonstrated.  9. The aortic valve was not well visualized. Aortic valve regurgitation is not visualized. No evidence of aortic valve sclerosis or stenosis. 10. TR signal is inadequate for assessing pulmonary artery systolic pressure.  Patient Profile     53 y.o. female with known CAD and history of RCA stenting and left circumflex stenting in 2012, presenting with non-STEMI.  Patient with history of alcohol and tobacco  abuse as well as medical noncompliance. 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 She is now s/p CABG 01/18/19.   Assessment & Plan    1.  CAD/Non-STEMI: Stable post CABG.  She is off of pressors. Appreciate excellent care of the surgery team. Continue ASA, statin and beta blocker. Please call over the weekend if we can be of assistance.    For questions or updates, please contact [facility] Please consult [email or web address] for contact info under      Signed, Verne Carrow, MD  01/19/2019, 8:12 AM

## 2019-01-19 NOTE — Progress Notes (Signed)
Patient arrived to 72E22 from [facility]. VSS. CHG complete. On monitor CCMD notified. Oriented to room and call light.  Paulene Floor, RN

## 2019-01-19 NOTE — Discharge Instructions (Signed)
Discharge Instructions:  1. You may shower, please wash incisions daily with soap and water and keep dry.  If you wish to cover wounds with dressing you may do so but please keep clean and change daily.  No tub baths or swimming until incisions have completely healed.  If your incisions become red or develop any drainage please call our office at [phone]  2. No Driving until cleared by Dr. Abran Duke office and you are no longer using narcotic pain medications  3. Monitor your weight daily.. Please use the same scale and weigh at same time... If you gain 5-10 lbs in 48 hours with associated lower extremity swelling, please contact our office at [phone]  4. Fever of 101.5 for at least 24 hours with no source, please contact our office at [phone]   Prediabetes Eating Plan Prediabetes is a condition that causes blood sugar (glucose) levels to be higher than normal. This increases the risk for developing diabetes. In order to prevent diabetes from developing, your health care provider may recommend a diet and other lifestyle changes to help you:  Control your blood glucose levels.  Improve your cholesterol levels.  Manage your blood pressure. Your health care provider may recommend working with a diet and nutrition specialist (dietitian) to make a meal plan that is best for you. What are tips for following this plan? Lifestyle  Set weight loss goals with the help of your health care team. It is recommended that most people with prediabetes lose 7% of their current body weight.  Exercise for at least 30 minutes at least 5 days a week.  Attend a support group or seek ongoing support from a mental health counselor.  Take over-the-counter and prescription medicines only as told by your health care provider. Reading food labels  Read food labels to check the amount of fat, salt (sodium), and sugar in prepackaged foods. Avoid foods that have: ? Saturated fats. ? Trans  fats. ? Added sugars.  Avoid foods that have more than 300 milligrams (mg) of sodium per serving. Limit your daily sodium intake to less than 2,300 mg each day. Shopping  Avoid buying pre-made and processed foods. Cooking  Cook with olive oil. Do not use butter, lard, or ghee.  Bake, broil, grill, or boil foods. Avoid frying. Meal planning   Work with your dietitian to develop an eating plan that is right for you. This may include: ? Tracking how many calories you take in. Use a food diary, notebook, or mobile application to track what you eat at each meal. ? Using the glycemic index (GI) to plan your meals. The index tells you how quickly a food will raise your blood glucose. Choose low-GI foods. These foods take a longer time to raise blood glucose.  Consider following a Mediterranean diet. This diet includes: ? Several servings each day of fresh fruits and vegetables. ? Eating fish at least twice a week. ? Several servings each day of whole grains, beans, nuts, and seeds. ? Using olive oil instead of other fats. ? Moderate alcohol consumption. ? Eating small amounts of red meat and whole-fat dairy.  If you have high blood pressure, you may need to limit your sodium intake or follow a diet such as the DASH eating plan. DASH is an eating plan that aims to lower high blood pressure. What foods are recommended? The items listed below may not be a complete list. Talk with your dietitian about what dietary choices are best for you. Grains  Whole grains, such as whole-wheat or whole-grain breads, crackers, cereals, and pasta. Unsweetened oatmeal. Bulgur. Barley. Quinoa. Brown rice. Corn or whole-wheat flour tortillas or taco shells. Vegetables Lettuce. Spinach. Peas. Beets. Cauliflower. Cabbage. Broccoli. Carrots. Tomatoes. Squash. Eggplant. Herbs. Peppers. Onions. Cucumbers. Brussels sprouts. Fruits Berries. Bananas. Apples. Oranges. Grapes. Papaya. Mango. Pomegranate. Kiwi.  Grapefruit. Cherries. Meats and other protein foods Seafood. Poultry without skin. Lean cuts of pork and beef. Tofu. Eggs. Nuts. Beans. Dairy Low-fat or fat-free dairy products, such as yogurt, cottage cheese, and cheese. Beverages Water. Tea. Coffee. Sugar-free or diet soda. Seltzer water. Lowfat or no-fat milk. Milk alternatives, such as soy or almond milk. Fats and oils Olive oil. Canola oil. Sunflower oil. Grapeseed oil. Avocado. Walnuts. Sweets and desserts Sugar-free or low-fat pudding. Sugar-free or low-fat ice cream and other frozen treats. Seasoning and other foods Herbs. Sodium-free spices. Mustard. Relish. Low-fat, low-sugar ketchup. Low-fat, low-sugar barbecue sauce. Low-fat or fat-free mayonnaise. What foods are not recommended? The items listed below may not be a complete list. Talk with your dietitian about what dietary choices are best for you. Grains Refined white flour and flour products, such as bread, pasta, snack foods, and cereals. Vegetables Canned vegetables. Frozen vegetables with butter or cream sauce. Fruits Fruits canned with syrup. Meats and other protein foods Fatty cuts of meat. Poultry with skin. Breaded or fried meat. Processed meats. Dairy Full-fat yogurt, cheese, or milk. Beverages Sweetened drinks, such as sweet iced tea and soda. Fats and oils Butter. Lard. Ghee. Sweets and desserts Baked goods, such as cake, cupcakes, pastries, cookies, and cheesecake. Seasoning and other foods Spice mixes with added salt. Ketchup. Barbecue sauce. Mayonnaise. Summary  To prevent diabetes from developing, you may need to make diet and other lifestyle changes to help control blood sugar, improve cholesterol levels, and manage your blood pressure.  Set weight loss goals with the help of your health care team. It is recommended that most people with prediabetes lose 7 percent of their current body weight.  Consider following a Mediterranean diet that includes  plenty of fresh fruits and vegetables, whole grains, beans, nuts, seeds, fish, lean meat, low-fat dairy, and healthy oils. This information is not intended to replace advice given to you by your health care provider. Make sure you discuss any questions you have with your health care provider. Document Released: 06/11/2014 Document Revised: 05/19/2018 Document Reviewed: 03/31/2016 Elsevier Patient Education  2020 [employer]   5. Activity- up as tolerated, please walk at least 3 times per day.  Avoid strenuous activity, no lifting, pushing, or pulling with your arms over 8-10 lbs for a minimum of 6 weeks  6. If any questions or concerns arise, please do not hesitate to contact our office at [phone]

## 2019-01-19 NOTE — Discharge Summary (Signed)
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      Reactions   Codeine Nausea And Vomiting   Percocet [oxycodone-acetaminophen] Nausea And Vomiting   Vicodin [hydrocodone-acetaminophen] Nausea And Vomiting   Atorvastatin Other (See Comments)   "This made me jittery and I didn't like the way it made me feel"   Latex Itching   Reaction to powder in latex gloves      Medication List    STOP taking these medications   carvedilol 25 MG tablet Commonly known as: COREG   losartan 50 MG tablet Commonly known as: COZAAR   triamterene-hydrochlorothiazide 37.5-25 MG tablet Commonly known as: MAXZIDE-25     TAKE these medications   albuterol 108 (90 Base) MCG/ACT inhaler Commonly known as: VENTOLIN HFA Inhale 2 puffs into the lungs every 6 (six) hours as needed for wheezing or shortness of breath.   amoxicillin-clavulanate 875-125 MG tablet Commonly known as: AUGMENTIN Take 1 tablet by mouth every 12 (twelve) hours.   aspirin 325 MG EC tablet Take 1 tablet (325 mg total) by mouth daily. What  changed:   medication strength  how much to take   busPIRone 10 MG tablet Commonly known as: BUSPAR Take 1 tablet (10 mg total) by mouth 3 (three) times daily.   escitalopram 20 MG tablet Commonly known as: LEXAPRO Take 1 tablet (20 mg total) by mouth daily.   ferrous sulfate 325 (65 FE) MG tablet Take 1 tablet (325 mg total) by mouth daily with breakfast. For one month then stop;if develops constipation, may stop sooner   furosemide 40 MG tablet Commonly known as: LASIX Take 1 tablet (40 mg total) by mouth daily. For one week then stop.   gabapentin 300 MG capsule Commonly known as: NEURONTIN Take 1 capsule (300 mg total) by mouth 3 (three) times daily.   metoprolol tartrate 25 MG tablet Commonly known as: LOPRESSOR Take 1 tablet (25 mg total) by mouth 2 (two) times daily.   nicotine 14 mg/24hr patch Commonly known as: NICODERM CQ - dosed in mg/24 hours Place 1 patch (14 mg total) onto the skin daily.   pantoprazole 40 MG tablet Commonly known as: PROTONIX Take  1 tablet (40 mg total) by mouth every morning.   potassium chloride SA 20 MEQ tablet Commonly known as: KLOR-CON Take 1 tablet (20 mEq total) by mouth daily. For one week then stop   rosuvastatin 40 MG tablet Commonly known as: CRESTOR Take 1 tablet (40 mg total) by mouth daily at 6 PM.   SEROquel 100 MG tablet Generic drug: QUEtiapine Take 100 mg by mouth at bedtime.   Strattera 40 MG capsule Generic drug: atomoxetine Take 40 mg by mouth every morning.   traMADol 50 MG tablet Commonly known as: ULTRAM Take 1 tablet (50 mg total) by mouth every 6 (six) hours as needed for severe pain.      The patient has been discharged on:   1.Beta Blocker:  Yes [ x  ]                              No   [   ]                              If No, reason:  2.Ace Inhibitor/ARB: Yes [   ]                                     No  [   x ]                                     If No, reason:Labile BP;hope to start  after discharge once BP allows  3.Statin:   Yes [  x ]                  No  [   ]                  If No, reason:  4.Ecasa:  Yes  [  x ]                  No   [   ]                  If No, reason:  Follow Up Appointments: Follow-up Information    [facility] Follow up.   Why: Follow up for therapy appointment Contact information: [address] [place] [address] [phone]        [facility] Follow up.   Specialty: Professional Counselor Why: Follow up for therapy appointment Contact information: [facility] [address] [place] [address] [phone]        Placey, Chales Abrahams, NP. Call.   Why: for a follow up appointment regarding further surveillance of HGA1C 6.3 (pre diabetes) Contact information: [address] [place] [address] [phone]        Tereso Newcomer T, PA-C. Go on 02/14/2019.   Specialties: Cardiology, Physician Assistant Why: Appointment time is at 9:45 am  Contact information: [address] [place] [address] [phone]        Corliss Skains, MD. Go on 01/31/2019.   Specialty: Cardiothoracic Surgery Why: Appointment time is at 3:15 pm Contact information: [address] [place] [address] [phone]  SignedLelon Huh ZimmermanPA-C 01/25/2019, 7:53 AM

## 2019-01-19 NOTE — Progress Notes (Addendum)
[facility] DAILY ICU PROGRESS NOTE                   [address]            [place] [address]          [phone]   1 Day Post-Op Procedure(s) (LRB): CORONARY ARTERY BYPASS GRAFTING (CABG) X4 ON PUMP USING LEFT INTERNAL MAMMARY ARTERY AND LEFT GREATER SAPHENOUS VEIN ENDOSCOPICALLY HARVESTED GRAFTS (N/A) Transesophageal Echocardiogram (Tee)  Total Length of Stay:  LOS: 7 days   Subjective: Patient awake, alert this am.  Objective: Vital signs in last 24 hours: Temp:  [98.7 F (37.1 C)-99.6 F (37.6 C)] 98.7 F (37.1 C) (12/11 0749) Pulse Rate:  [76-103] 81 (12/11 0700) Cardiac Rhythm: Normal sinus rhythm (12/11 0400) Resp:  [0-36] 18 (12/11 0700) BP: (89-137)/(59-87) 106/72 (12/11 0700) SpO2:  [91 %-100 %] 97 % (12/11 0700) Arterial Line BP: (80-131)/(46-75) 97/52 (12/11 0700) FiO2 (%):  [40 %-50 %] 40 % (12/10 2000) Weight:  [86.2 kg-91.9 kg] 91.9 kg (12/11 0500)  Filed Weights   01/18/19 0551 01/18/19 0814 01/19/19 0500  Weight: 86.2 kg 86.2 kg 91.9 kg    Weight change: 0.016 kg   Hemodynamic parameters for last 24 hours:    Intake/Output from previous day: 12/10 0701 - 12/11 0700 In: 4630.1 [P.O.:240; I.V.:2987.2; Blood:200; IV Piggyback:1202.9] Out: 2315 [Urine:1565; Blood:400; Chest Tube:350]  Intake/Output this shift: No intake/output data recorded.  Current Meds: Scheduled Meds: . acetaminophen  1,000 mg Oral Q6H   Or  . acetaminophen (TYLENOL) oral liquid 160 mg/5 mL  1,000 mg Per Tube Q6H  . aspirin EC  325 mg Oral Daily   Or  . aspirin  324 mg Per Tube Daily  . bisacodyl  10 mg Oral Daily   Or  . bisacodyl  10 mg Rectal Daily  . busPIRone  10 mg Oral TID  . Chlorhexidine Gluconate Cloth  6 each Topical Daily  . docusate sodium  200 mg Oral Daily  . escitalopram  20 mg Oral Daily  . folic acid  1 mg Oral Daily  . gabapentin  300 mg Oral TID  . insulin aspart  0-24 Units Subcutaneous Q4H  . metoprolol tartrate  12.5 mg Oral BID     Or  . metoprolol tartrate  12.5 mg Per Tube BID  . multivitamin with minerals  1 tablet Oral Daily  . nicotine  14 mg Transdermal Daily  . [START ON 01/20/2019] pantoprazole  40 mg Oral Daily  . QUEtiapine  100 mg Oral QHS  . rosuvastatin  40 mg Oral q1800  . sodium chloride flush  3 mL Intravenous Q12H  . thiamine  100 mg Oral Daily   Continuous Infusions: . sodium chloride    . sodium chloride    . sodium chloride    . albumin human 12.5 g (01/18/19 1500)  . cefUROXime (ZINACEF)  IV Stopped (01/18/19 2157)  . dexmedetomidine (PRECEDEX) IV infusion Stopped (01/19/19 1245)  . lactated ringers    . lactated ringers    . lactated ringers 20 mL/hr (01/18/19 1430)  . niCARDipine Stopped (01/18/19 1837)  . nitroGLYCERIN Stopped (01/18/19 2314)  . norepinephrine (LEVOPHED) Adult infusion    . phenylephrine (NEO-SYNEPHRINE) Adult infusion 0 mcg/min (01/18/19 1430)   PRN Meds:.sodium chloride, albumin human, ALPRAZolam, lactated ringers, levalbuterol, metoprolol tartrate, midazolam, morphine injection, ondansetron (ZOFRAN) IV, sodium chloride flush, traMADol  General appearance: alert, cooperative and no distress Neurologic: intact Heart: RRR,  rub with chest tubes in place Lungs: Diminshed bibasilar breath sounds Abdomen: Soft, non tender, spordic bowel sounds Extremities: Mild LLE edema Wound: Pravena wound VAC on sternal wound;LLE dressings are clean and dry  Lab Results: CBC: Recent Labs    01/18/19 2027 01/18/19 2209 01/19/19 0353  WBC 13.4*  --  13.1*  HGB 11.1* 10.9* 10.2*  HCT 36.1 32.0* 32.5*  PLT 246  --  227   BMET:  Recent Labs    01/18/19 2027 01/18/19 2209 01/19/19 0353  NA 142 143 141  K 4.7 4.3 4.3  CL 111  --  108  CO2 25  --  25  GLUCOSE 122*  --  128*  BUN 7  --  8  CREATININE 0.74  --  0.79  CALCIUM 8.2*  --  8.5*    CMET: Lab Results  Component Value Date   WBC 13.1 (H) 01/19/2019   HGB 10.2 (L) 01/19/2019   HCT 32.5 (L) 01/19/2019    PLT 227 01/19/2019   GLUCOSE 128 (H) 01/19/2019   CHOL 145 01/13/2019   TRIG 107 01/13/2019   HDL 38 (L) 01/13/2019   LDLCALC 86 01/13/2019   ALT 49 (H) 01/17/2019   AST 54 (H) 01/17/2019   NA 141 01/19/2019   K 4.3 01/19/2019   CL 108 01/19/2019   CREATININE 0.79 01/19/2019   BUN 8 01/19/2019   CO2 25 01/19/2019   TSH 1.375 01/12/2019   INR 1.4 (H) 01/18/2019   HGBA1C 6.3 (H) 01/17/2019      PT/INR:  Recent Labs    01/18/19 1452  LABPROT 17.0*  INR 1.4*   Radiology: DG Chest Port 1 View  Result Date: 01/18/2019 CLINICAL DATA:  Status post CABG EXAM: PORTABLE CHEST 1 VIEW COMPARISON:  01/11/2019 FINDINGS: Interval postoperative findings of median sternotomy and CABG. Extensive support apparatus includes endotracheal tube, tip positioned just above the carina, esophagogastric tube with tip below the diaphragm and side port near the gastroesophageal junction, and right neck pulmonary arterial catheter, tip directed peripherally into the right lower or right middle lobe. Bilateral chest and mediastinal drainage tubes. No significant pneumothorax. Cardiomegaly. IMPRESSION: 1.  Interval postoperative findings of median sternotomy and CABG. 2. Extensive support apparatus includes endotracheal tube, tip positioned just above the carina, esophagogastric tube with tip below the diaphragm and side port near the gastroesophageal junction, and right neck pulmonary arterial catheter, tip directed peripherally into the right lower or right middle lobe. 3. Consider advancement of esophagogastric tube to ensure subdiaphragmatic positioning of side-port. 4. No significant pneumothorax or other acute abnormality of the lungs. Electronically Signed   By: Lauralyn Primes M.D.   On: 01/18/2019 15:03   ECHO INTRAOPERATIVE TEE  Result Date: 01/18/2019  *INTRAOPERATIVE TRANSESOPHAGEAL REPORT *  Patient Name:   [name]   Date of Exam: 01/18/2019 Medical Rec #:  [id number]      Height:       61.0 in  Accession #:    [id number]     Weight:       190.0 lb Date of Birth:  1965/09/25      BSA:          1.85 m Patient Age:    53 years       BP:           130/80 mmHg Patient Gender: F              HR:  80 bpm. Exam Location:  Anesthesiology Transesophogeal exam was perform intraoperatively during surgical procedure. Patient was closely monitored under general anesthesia during the entirety of examination. Indications:     Coronary artery disease Sonographer:     Tonia GhentJulia Underwood RDCS Performing Phys: [id number] Eliezer LoftsHARRELL O Lively Haberman Diagnosing Phys: Arta BruceKevin Ossey MD Complications: No known complications during this procedure. POST-OP IMPRESSIONS - Left Ventricle: The left ventricle is unchanged from pre-bypass. - Aorta: The aorta appears unchanged from pre-bypass. - Aortic Valve: The aortic valve appears unchanged from pre-bypass. - Mitral Valve: There is mild regurgitation. PRE-OP FINDINGS  Left Ventricle: The left ventricle has normal systolic function, with an ejection fraction of 55-60%. The cavity size was normal. There is concentric left ventricular hypertrophy. Right Ventricle: The right ventricle has normal systolic function. The cavity was normal. There is no increase in right ventricular wall thickness. Left Atrium: Left atrial size was normal in size. The left atrial appendage is well visualized and there is no evidence of thrombus present. Right Atrium: Right atrial size was normal in size. Right atrial pressure is estimated at 10 mmHg. Interatrial Septum: No atrial level shunt detected by color flow Doppler. Pericardium: There is no evidence of pericardial effusion. Mitral Valve: The mitral valve is normal in structure. No thickening of the mitral valve leaflet. No calcification of the mitral valve leaflet. Mitral valve regurgitation is not visualized by color flow Doppler. Tricuspid Valve: The tricuspid valve was normal in structure. Tricuspid valve regurgitation was not visualized by color flow  Doppler. Aortic Valve: The aortic valve is normal in structure. Aortic valve regurgitation was not visualized by color flow Doppler. There is no stenosis of the aortic valve. Pulmonic Valve: The pulmonic valve was normal in structure. Pulmonic valve regurgitation is not visualized by color flow Doppler.  Arta BruceKevin Ossey MD Electronically signed by Arta BruceKevin Ossey MD Signature Date/Time: 01/18/2019/3:01:26 PM    Final      Assessment/Plan: S/P Procedure(s) (LRB): CORONARY ARTERY BYPASS GRAFTING (CABG) X4 ON PUMP USING LEFT INTERNAL MAMMARY ARTERY AND LEFT GREATER SAPHENOUS VEIN ENDOSCOPICALLY HARVESTED GRAFTS (N/A) Transesophageal Echocardiogram (Tee)   1. CV-SR. CO/CI 5.5/3. On Lopressor 12.5 mg bid. She was on Coreg prior to admission so will discuss with Dr. Cliffton AstersLightfoot if should change. 2. Pulmonary-Chest tubes with 350 cc of output since surgery. Likely remove mediastinal and keep both pleural tubes . On 3 liters of oxygen via [place]. Will wean as able over next few days. CXR this am appears to show low lung volumes, cardiomegaly. Encourage incentive spirometer. 3. Volume overload-will give Lasix 20 mg IV bid today 4. ABL anemia- H and H this am 10.2 and 32.5 5. CBGs 131/92/124. Will wean off Insulin drip. Pre op HGA1C 6.3. She likely has pre diabetes. She will need further surveillance after discharge. Will provide nutritional information with discharge paperwork.  6. History of etoh abuse-on CIWA, thiamine 7. History of depression and bipolar disorder-on Lexapro and Strattera 7. Please see progression orders   Donielle Margaretann LovelessM Zimmerman PA-C 01/19/2019 7:55 AM   Doing well Will remove drains after ambulation Floor orders placed  Jishnu Jenniges O Mishika Flippen

## 2019-01-20 ENCOUNTER — Inpatient Hospital Stay (HOSPITAL_COMMUNITY): Payer: Self-pay

## 2019-01-20 DIAGNOSIS — Z951 Presence of aortocoronary bypass graft: Secondary | ICD-10-CM

## 2019-01-20 LAB — CBC
HCT: 33.1 % — ABNORMAL LOW (ref 36.0–46.0)
Hemoglobin: 10.1 g/dL — ABNORMAL LOW (ref 12.0–15.0)
MCH: 28.2 pg (ref 26.0–34.0)
MCHC: 30.5 g/dL (ref 30.0–36.0)
MCV: 92.5 fL (ref 80.0–100.0)
Platelets: 204 10*3/uL (ref 150–400)
RBC: 3.58 MIL/uL — ABNORMAL LOW (ref 3.87–5.11)
RDW: 13.5 % (ref 11.5–15.5)
WBC: 14.6 10*3/uL — ABNORMAL HIGH (ref 4.0–10.5)
nRBC: 0 % (ref 0.0–0.2)

## 2019-01-20 LAB — BASIC METABOLIC PANEL
Anion gap: 11 (ref 5–15)
BUN: 10 mg/dL (ref 6–20)
CO2: 22 mmol/L (ref 22–32)
Calcium: 8.6 mg/dL — ABNORMAL LOW (ref 8.9–10.3)
Chloride: 105 mmol/L (ref 98–111)
Creatinine, Ser: 0.97 mg/dL (ref 0.44–1.00)
GFR calc Af Amer: >60 mL/min (ref >60)
GFR calc non Af Amer: >60 mL/min (ref >60)
Glucose, Bld: 95 mg/dL (ref 70–99)
Potassium: 4.7 mmol/L (ref 3.5–5.1)
Sodium: 138 mmol/L (ref 135–145)

## 2019-01-20 LAB — GLUCOSE, CAPILLARY
Glucose-Capillary: 119 mg/dL — ABNORMAL HIGH (ref 70–99)
Glucose-Capillary: 132 mg/dL — ABNORMAL HIGH (ref 70–99)
Glucose-Capillary: 136 mg/dL — ABNORMAL HIGH (ref 70–99)
Glucose-Capillary: 91 mg/dL (ref 70–99)
Glucose-Capillary: 96 mg/dL (ref 70–99)
Glucose-Capillary: 97 mg/dL (ref 70–99)

## 2019-01-20 NOTE — Progress Notes (Signed)
[address]       [place] [address]             [phone]                 2 Days Post-Op Procedure(s) (LRB): CORONARY ARTERY BYPASS GRAFTING (CABG) X4 ON PUMP USING LEFT INTERNAL MAMMARY ARTERY AND LEFT GREATER SAPHENOUS VEIN ENDOSCOPICALLY HARVESTED GRAFTS (N/A) Transesophageal Echocardiogram (Tee)  LOS: 8 days   Subjective: Up in chair , co of incisional pain  Objective: Vital signs in last 24 hours: Patient Vitals for the past 24 hrs:  BP Temp Temp src Pulse Resp SpO2 Weight  01/20/19 1205 -- -- -- -- 16 -- --  01/20/19 1134 (!) 143/76 98.6 F (37 C) Oral (!) 148 (!) 26 98 % --  01/20/19 0800 132/75 -- -- 86 (!) 23 100 % --  01/20/19 0606 -- -- -- -- -- -- 91.6 kg  01/20/19 0356 130/72 99.5 F (37.5 C) Oral 89 (!) 28 91 % --  01/19/19 2358 111/71 99.3 F (37.4 C) Oral 82 (!) 26 94 % --  01/19/19 2009 125/64 99.7 F (37.6 C) Oral (!) 34 (!) 31 95 % --  01/19/19 1620 -- -- -- -- (!) 30 -- --  01/19/19 1615 -- -- -- -- (!) 36 -- --  01/19/19 1610 126/71 -- -- -- (!) 26 -- --  01/19/19 1530 -- 98.4 F (36.9 C) Oral -- -- -- --  01/19/19 1500 -- -- -- -- (!) 25 -- --  01/19/19 1400 122/78 -- -- -- (!) 28 -- --  01/19/19 1300 102/72 -- -- 71 (!) 21 99 % --    Filed Weights   01/18/19 0814 01/19/19 0500 01/20/19 0606  Weight: 86.2 kg 91.9 kg 91.6 kg    Hemodynamic parameters for last 24 hours:    Intake/Output from previous day: 12/11 0701 - 12/12 0700 In: 855.9 [P.O.:600; I.V.:156; IV Piggyback:99.9] Out: 1370 [Urine:1300; Chest Tube:70] Intake/Output this shift: Total I/O In: 240 [P.O.:240] Out: 0   Scheduled Meds: . acetaminophen  1,000 mg Oral Q6H   Or  . acetaminophen (TYLENOL) oral liquid 160 mg/5 mL  1,000 mg Per Tube Q6H  . aspirin EC  325 mg Oral Daily   Or  . aspirin  324 mg Per Tube Daily  . bisacodyl  10 mg Oral Daily   Or  . bisacodyl  10 mg Rectal Daily  . busPIRone  10 mg Oral TID  . Chlorhexidine Gluconate  Cloth  6 each Topical Daily  . docusate sodium  200 mg Oral Daily  . enoxaparin (LOVENOX) injection  40 mg Subcutaneous QHS  . escitalopram  20 mg Oral Daily  . folic acid  1 mg Oral Daily  . furosemide  40 mg Oral Daily  . gabapentin  300 mg Oral TID  . insulin aspart  0-24 Units Subcutaneous Q4H  . metoprolol tartrate  12.5 mg Oral BID   Or  . metoprolol tartrate  12.5 mg Per Tube BID  . multivitamin with minerals  1 tablet Oral Daily  . nicotine  14 mg Transdermal Daily  . pantoprazole  40 mg Oral Daily  . potassium chloride  40 mEq Oral Daily  . QUEtiapine  100 mg Oral QHS  . rosuvastatin  40 mg Oral q1800  . sodium chloride flush  3 mL Intravenous Q12H  . sodium chloride flush  3 mL Intravenous Q12H  . thiamine  100 mg Oral Daily   Continuous Infusions: . sodium chloride    . sodium chloride    . sodium chloride    . sodium chloride    . lactated ringers    . lactated ringers    . lactated ringers Stopped (01/19/19 1453)   PRN Meds:.sodium chloride, sodium chloride, ALPRAZolam, lactated ringers, levalbuterol, metoprolol tartrate, morphine injection, ondansetron (ZOFRAN) IV, sodium chloride flush, sodium chloride flush, traMADol  General appearance: alert, cooperative and no distress Neurologic: intact Heart: regular rate and rhythm, S1, S2 normal, no murmur, click, rub or gallop Lungs: diminished breath sounds bibasilar Abdomen: soft, non-tender; bowel sounds normal; no masses,  no organomegaly Extremities: extremities normal, atraumatic, no cyanosis or edema and Homans sign is negative, no sign of DVT Wound: sternum intact  Lab Results: CBC: Recent Labs    01/19/19 1630 01/20/19 0224  WBC 14.5* 14.6*  HGB 10.4* 10.1*  HCT 33.6* 33.1*  PLT 231 204   BMET:  Recent Labs    01/19/19 1630 01/20/19 0224  NA 137 138  K 4.4 4.7  CL 102 105  CO2 24 22  GLUCOSE 120* 95  BUN 10 10  CREATININE 1.03* 0.97  CALCIUM 8.7* 8.6*    PT/INR:  Recent Labs     01/18/19 1452  LABPROT 17.0*  INR 1.4*     Radiology DG Chest Port 1 View  Result Date: 01/20/2019 CLINICAL DATA:  Update status Chest pain EXAM: PORTABLE CHEST 1 VIEW COMPARISON:  Chest radiograph 01/19/2019 FINDINGS: Interval removal of a right IJ sheath and bilateral chest tubes. Stable cardiomediastinal contours status post median sternotomy and CABG. Improved aeration of the bilateral lungs. Minimal scattered perihilar opacities likely reflecting atelectasis no large pleural effusion or pneumothorax. No acute process in the visualized bones. IMPRESSION: Scattered atelectasis.  No other acute findings. Electronically Signed   By: Emmaline Kluver M.D.   On: 01/20/2019 09:37   DG Chest Port 1 View  Result Date: 01/19/2019 CLINICAL DATA:  53 year old female status post CABG postop day 1. EXAM: PORTABLE CHEST 1 VIEW COMPARISON:  01/18/2019 portable chest and earlier. FINDINGS: Portable AP semi upright view at 0536 hours. Extubated. Enteric tube removed. Chest tubes and mediastinal tubes remain. Swan-Ganz catheter removed, right IJ introducer sheath remains. Lower lung volumes. No pneumothorax or pulmonary edema. Perihilar and basilar atelectasis. Stable cardiac size and mediastinal contours. Negative visible bowel gas pattern. IMPRESSION: 1. Extubated. Enteric tube removed. Swan-Ganz catheter removed. 2. Lower lung volumes with atelectasis. No pneumothorax or pulmonary edema. Electronically Signed   By: Odessa Fleming M.D.   On: 01/19/2019 08:18   DG Chest Port 1 View  Result Date: 01/18/2019 CLINICAL DATA:  Status post CABG EXAM: PORTABLE CHEST 1 VIEW COMPARISON:  01/11/2019 FINDINGS: Interval postoperative findings of median sternotomy and CABG. Extensive support apparatus includes endotracheal tube, tip positioned just above the carina, esophagogastric tube with tip below the diaphragm and side port near the gastroesophageal junction, and right neck pulmonary arterial catheter, tip directed  peripherally into the right lower or right middle lobe. Bilateral chest and mediastinal drainage tubes. No significant pneumothorax. Cardiomegaly. IMPRESSION: 1.  Interval postoperative findings of median sternotomy and CABG. 2. Extensive support apparatus includes endotracheal tube, tip positioned just above the carina, esophagogastric tube with tip below the diaphragm and side port near the gastroesophageal junction, and right neck pulmonary arterial catheter, tip directed peripherally into the right lower or right middle lobe. 3. Consider advancement of esophagogastric tube to ensure subdiaphragmatic positioning of  side-port. 4. No significant pneumothorax or other acute abnormality of the lungs. Electronically Signed   By: Lauralyn PrimesAlex  Bibbey M.D.   On: 01/18/2019 15:03     Assessment/Plan: S/P Procedure(s) (LRB): CORONARY ARTERY BYPASS GRAFTING (CABG) X4 ON PUMP USING LEFT INTERNAL MAMMARY ARTERY AND LEFT GREATER SAPHENOUS VEIN ENDOSCOPICALLY HARVESTED GRAFTS (N/A) Transesophageal Echocardiogram (Tee) Mobilize Diuresis Stable postop day 3 holding sinus   Delight OvensEdward B Tishie Altmann MD 01/20/2019 12:39 PM     Patient ID: [name], female   DOB: Jun 03, 1965, 53 y.o.   MRN: [id number]

## 2019-01-20 NOTE — Evaluation (Signed)
Physical Therapy Evaluation Patient Details Name: [name] MRN: [id number] DOB: 1965/12/26 Today's Date: 01/20/2019   History of Present Illness  53 y.o. female with known CAD and history of RCA stenting and left circumflex stenting in 2012, presenting with non-STEMI.  Patient with history of alcohol and tobacco abuse as well as medical noncompliance. 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 She is now s/p CABG 01/18/19.  Clinical Impression  Pt admitted with above diagnosis. Pt ambulated 100' with RW, HR 90s walking, unable to obtain SaO2 level while ambulating as finger probe did not read. Ambulated with 2L O2, 2/4 dyspnea. Distance limited by fatigue. Good progress expected.  Pt currently with functional limitations due to the deficits listed below (see PT Problem List). Pt will benefit from skilled PT to increase their independence and safety with mobility to allow discharge to the venue listed below.       Follow Up Recommendations Other (comment);Home health PT vs. cardiopulmonary rehab    Equipment Recommendations  Rolling walker with 5" wheels    Recommendations for Other Services       Precautions / Restrictions Precautions Precautions: Sternal Restrictions Weight Bearing Restrictions: Yes(sternal precautions)      Mobility  Bed Mobility Overal bed mobility: Modified Independent             General bed mobility comments: HOB up, used rail  Transfers Overall transfer level: Needs assistance Equipment used: Rolling walker (2 wheeled) Transfers: Sit to/from Stand Sit to Stand: Supervision         General transfer comment: VCs hand placement  Ambulation/Gait Ambulation/Gait assistance: Supervision Gait Distance (Feet): 100 Feet Assistive device: Rolling walker (2 wheeled) Gait Pattern/deviations: Step-through pattern;Decreased stride length;Trunk flexed Gait velocity: decr   General Gait Details: VCs for posture, no loss of balance, HR 90s walking, pulse  ox did not read during ambulation, ambulated with 2L O2, 2/4 dyspnea  Stairs            Wheelchair Mobility    Modified Rankin (Stroke Patients Only)       Balance Overall balance assessment: Modified Independent                                           Pertinent Vitals/Pain Pain Assessment: 0-10 Pain Score: 8  Pain Location: chest Pain Descriptors / Indicators: Sore Pain Intervention(s): Limited activity within patient's tolerance;Monitored during session;Premedicated before session    Home Living Family/patient expects to be discharged to:: Private residence Living Arrangements: Children Available Help at Discharge: Family;Available 24 hours/day   Home Access: Level entry     Home Layout: One level Home Equipment: Cane - single point      Prior Function Level of Independence: Independent               Hand Dominance        Extremity/Trunk Assessment   Upper Extremity Assessment Upper Extremity Assessment: Defer to OT evaluation    Lower Extremity Assessment Lower Extremity Assessment: Overall WFL for tasks assessed       Communication   Communication: No difficulties  Cognition Arousal/Alertness: Awake/alert Behavior During Therapy: WFL for tasks assessed/performed Overall Cognitive Status: Within Functional Limits for tasks assessed  General Comments      Exercises     Assessment/Plan    PT Assessment Patient needs continued PT services  PT Problem List Decreased activity tolerance;Decreased mobility       PT Treatment Interventions Gait training;DME instruction;Therapeutic exercise;Therapeutic activities;Functional mobility training    PT Goals (Current goals can be found in the Care Plan section)  Acute Rehab PT Goals Patient Stated Goal: return to school at [employer] PT Goal Formulation: With patient Time For Goal Achievement: 02/03/19 Potential to  Achieve Goals: Good    Frequency Min 3X/week   Barriers to discharge        Co-evaluation               AM-PAC PT "6 Clicks" Mobility  Outcome Measure Help needed turning from your back to your side while in a flat bed without using bedrails?: A Little Help needed moving from lying on your back to sitting on the side of a flat bed without using bedrails?: A Little Help needed moving to and from a bed to a chair (including a wheelchair)?: A Little Help needed standing up from a chair using your arms (e.g., wheelchair or bedside chair)?: A Little Help needed to walk in hospital room?: A Little Help needed climbing 3-5 steps with a railing? : A Lot 6 Click Score: 17    End of Session Equipment Utilized During Treatment: Gait belt;Oxygen Activity Tolerance: Patient limited by fatigue;Patient tolerated treatment well Patient left: in chair;with call bell/phone within reach Nurse Communication: Mobility status PT Visit Diagnosis: Difficulty in walking, not elsewhere classified (R26.2)    Time: 9211-9417 PT Time Calculation (min) (ACUTE ONLY): 31 min   Charges:   PT Evaluation $PT Eval Low Complexity: 1 Low PT Treatments $Gait Training: 8-22 mins       Ralene Bathe Kistler PT 01/20/2019  Acute Rehabilitation Services Pager [phone] Office [phone]

## 2019-01-20 NOTE — Progress Notes (Signed)
CARDIAC REHAB PHASE I   Pt recently walked with PT. Walk limited by fatigue. Will follow on Monday. Encourage to walk with bedside staff.  Noel Christmas, RN 01/20/2019 12:26 PM

## 2019-01-21 LAB — TYPE AND SCREEN
ABO/RH(D): A POS
Antibody Screen: NEGATIVE
Unit division: 0
Unit division: 0

## 2019-01-21 LAB — CBC
HCT: 28.6 % — ABNORMAL LOW (ref 36.0–46.0)
Hemoglobin: 8.9 g/dL — ABNORMAL LOW (ref 12.0–15.0)
MCH: 28.5 pg (ref 26.0–34.0)
MCHC: 31.1 g/dL (ref 30.0–36.0)
MCV: 91.7 fL (ref 80.0–100.0)
Platelets: 244 10*3/uL (ref 150–400)
RBC: 3.12 MIL/uL — ABNORMAL LOW (ref 3.87–5.11)
RDW: 13.4 % (ref 11.5–15.5)
WBC: 15.8 10*3/uL — ABNORMAL HIGH (ref 4.0–10.5)
nRBC: 0.2 % (ref 0.0–0.2)

## 2019-01-21 LAB — BASIC METABOLIC PANEL
Anion gap: 10 (ref 5–15)
BUN: 10 mg/dL (ref 6–20)
CO2: 27 mmol/L (ref 22–32)
Calcium: 8.5 mg/dL — ABNORMAL LOW (ref 8.9–10.3)
Chloride: 102 mmol/L (ref 98–111)
Creatinine, Ser: 0.92 mg/dL (ref 0.44–1.00)
GFR calc Af Amer: >60 mL/min (ref >60)
GFR calc non Af Amer: >60 mL/min (ref >60)
Glucose, Bld: 105 mg/dL — ABNORMAL HIGH (ref 70–99)
Potassium: 4.6 mmol/L (ref 3.5–5.1)
Sodium: 139 mmol/L (ref 135–145)

## 2019-01-21 LAB — BPAM RBC
Blood Product Expiration Date: 202101132359
Blood Product Expiration Date: 202101132359
Unit Type and Rh: 6200
Unit Type and Rh: 6200

## 2019-01-21 LAB — GLUCOSE, CAPILLARY
Glucose-Capillary: 102 mg/dL — ABNORMAL HIGH (ref 70–99)
Glucose-Capillary: 96 mg/dL (ref 70–99)
Glucose-Capillary: 81 mg/dL (ref 70–99)
Glucose-Capillary: 90 mg/dL (ref 70–99)
Glucose-Capillary: 99 mg/dL (ref 70–99)
Glucose-Capillary: 130 mg/dL — ABNORMAL HIGH (ref 70–99)

## 2019-01-21 MED ORDER — LEVALBUTEROL HCL 0.63 MG/3ML IN NEBU
0.6300 mg | INHALATION_SOLUTION | Freq: Three times a day (TID) | RESPIRATORY_TRACT | Status: DC
  Administered 2019-01-21: 0.63 mg via RESPIRATORY_TRACT
  Filled 2019-01-21: qty 3

## 2019-01-21 MED ORDER — LEVALBUTEROL HCL 0.63 MG/3ML IN NEBU
0.6300 mg | INHALATION_SOLUTION | Freq: Three times a day (TID) | RESPIRATORY_TRACT | Status: DC
  Administered 2019-01-21 – 2019-01-25 (×12): 0.63 mg via RESPIRATORY_TRACT
  Filled 2019-01-21 (×13): qty 3

## 2019-01-21 MED ORDER — FUROSEMIDE 10 MG/ML IJ SOLN
40.0000 mg | Freq: Once | INTRAMUSCULAR | Status: AC
  Administered 2019-01-21: 40 mg via INTRAVENOUS
  Filled 2019-01-21: qty 4

## 2019-01-21 NOTE — Progress Notes (Signed)
[address]       [place] [address]             [phone]                 3 Days Post-Op Procedure(s) (LRB): CORONARY ARTERY BYPASS GRAFTING (CABG) X4 ON PUMP USING LEFT INTERNAL MAMMARY ARTERY AND LEFT GREATER SAPHENOUS VEIN ENDOSCOPICALLY HARVESTED GRAFTS (N/A) Transesophageal Echocardiogram (Tee)  LOS: 9 days   Subjective: Up to chair, complain of wheezing during night, smoker up until admission  Objective: Vital signs in last 24 hours: Patient Vitals for the past 24 hrs:  BP Temp Temp src Pulse Resp SpO2 Weight  01/21/19 0842 119/64 98.9 F (37.2 C) Oral 99 (!) 26 99 % --  01/21/19 0437 130/81 99.5 F (37.5 C) Oral 94 (!) 23 99 % 90.8 kg  01/20/19 2300 128/75 97.9 F (36.6 C) Oral 90 (!) 32 98 % --  01/20/19 2108 -- -- -- 88 (!) 28 100 % --  01/20/19 2021 123/79 97.9 F (36.6 C) Oral 86 (!) 22 98 % --  01/20/19 1205 -- -- -- -- 16 -- --  01/20/19 1134 (!) 143/76 98.6 F (37 C) Oral (!) 148 (!) 26 98 % --    Filed Weights   01/19/19 0500 01/20/19 0606 01/21/19 0437  Weight: 91.9 kg 91.6 kg 90.8 kg    Hemodynamic parameters for last 24 hours:    Intake/Output from previous day: 12/12 0701 - 12/13 0700 In: 480 [P.O.:480] Out: 410 [Urine:410] Intake/Output this shift: No intake/output data recorded.  Scheduled Meds: . acetaminophen  1,000 mg Oral Q6H   Or  . acetaminophen (TYLENOL) oral liquid 160 mg/5 mL  1,000 mg Per Tube Q6H  . aspirin EC  325 mg Oral Daily   Or  . aspirin  324 mg Per Tube Daily  . bisacodyl  10 mg Oral Daily   Or  . bisacodyl  10 mg Rectal Daily  . busPIRone  10 mg Oral TID  . Chlorhexidine Gluconate Cloth  6 each Topical Daily  . docusate sodium  200 mg Oral Daily  . enoxaparin (LOVENOX) injection  40 mg Subcutaneous QHS  . escitalopram  20 mg Oral Daily  . folic acid  1 mg Oral Daily  . furosemide  40 mg Oral Daily  . gabapentin  300 mg Oral TID  . insulin aspart  0-24 Units Subcutaneous Q4H  .  metoprolol tartrate  12.5 mg Oral BID   Or  . metoprolol tartrate  12.5 mg Per Tube BID  . multivitamin with minerals  1 tablet Oral Daily  . nicotine  14 mg Transdermal Daily  . pantoprazole  40 mg Oral Daily  . potassium chloride  40 mEq Oral Daily  . QUEtiapine  100 mg Oral QHS  . rosuvastatin  40 mg Oral q1800  . sodium chloride flush  3 mL Intravenous Q12H  . sodium chloride flush  3 mL Intravenous Q12H  . thiamine  100 mg Oral Daily   Continuous Infusions: . sodium chloride    . sodium chloride    . sodium chloride    . sodium chloride    . lactated ringers    . lactated ringers    . lactated ringers Stopped (01/19/19 1453)   PRN Meds:.sodium chloride, sodium chloride, ALPRAZolam, lactated ringers, levalbuterol, metoprolol tartrate, morphine injection, ondansetron (ZOFRAN) IV, sodium chloride flush, sodium chloride flush, traMADol  General appearance: alert, cooperative and  no distress Neurologic: intact Heart: regular rate and rhythm, S1, S2 normal, no murmur, click, rub or gallop Lungs: diminished breath sounds bibasilar with expiratory wheezing  Abdomen: soft, non-tender; bowel sounds normal; no masses,  no organomegaly Extremities: extremities normal, atraumatic, no cyanosis or edema and Homans sign is negative, no sign of DVT Wound: sternum intact  Lab Results: CBC: Recent Labs    01/20/19 0224 01/21/19 0220  WBC 14.6* 15.8*  HGB 10.1* 8.9*  HCT 33.1* 28.6*  PLT 204 244   BMET:  Recent Labs    01/20/19 0224 01/21/19 0220  NA 138 139  K 4.7 4.6  CL 105 102  CO2 22 27  GLUCOSE 95 105*  BUN 10 10  CREATININE 0.97 0.92  CALCIUM 8.6* 8.5*    PT/INR:  Recent Labs    01/18/19 1452  LABPROT 17.0*  INR 1.4*     Radiology DG Chest Port 1 View  Result Date: 01/20/2019 CLINICAL DATA:  Update status Chest pain EXAM: PORTABLE CHEST 1 VIEW COMPARISON:  Chest radiograph 01/19/2019 FINDINGS: Interval removal of a right IJ sheath and bilateral chest  tubes. Stable cardiomediastinal contours status post median sternotomy and CABG. Improved aeration of the bilateral lungs. Minimal scattered perihilar opacities likely reflecting atelectasis no large pleural effusion or pneumothorax. No acute process in the visualized bones. IMPRESSION: Scattered atelectasis.  No other acute findings. Electronically Signed   By: Audie Pinto M.D.   On: 01/20/2019 09:37     Assessment/Plan: S/P Procedure(s) (LRB): CORONARY ARTERY BYPASS GRAFTING (CABG) X4 ON PUMP USING LEFT INTERNAL MAMMARY ARTERY AND LEFT GREATER SAPHENOUS VEIN ENDOSCOPICALLY HARVESTED GRAFTS (N/A) Transesophageal Echocardiogram (Tee) Mobilize Diuresis Continue to work on pulmonary toilet, add bronchodilators   Grace Isaac MD 01/21/2019 11:28 AM     Patient ID: [name], female   DOB: Feb 20, 1965, 53 y.o.   MRN: [id number] Patient ID: [name], female   DOB: 03-12-1965, 53 y.o.   MRN: [id number]

## 2019-01-22 ENCOUNTER — Inpatient Hospital Stay (HOSPITAL_COMMUNITY): Payer: Self-pay

## 2019-01-22 DIAGNOSIS — F319 Bipolar disorder, unspecified: Secondary | ICD-10-CM

## 2019-01-22 DIAGNOSIS — Z720 Tobacco use: Secondary | ICD-10-CM

## 2019-01-22 DIAGNOSIS — E782 Mixed hyperlipidemia: Secondary | ICD-10-CM

## 2019-01-22 DIAGNOSIS — I25119 Atherosclerotic heart disease of native coronary artery with unspecified angina pectoris: Secondary | ICD-10-CM

## 2019-01-22 LAB — POCT I-STAT, CHEM 8
BUN: 8 mg/dL (ref 6–20)
BUN: 5 mg/dL — ABNORMAL LOW (ref 6–20)
BUN: 7 mg/dL (ref 6–20)
BUN: 7 mg/dL (ref 6–20)
Calcium, Ion: 1.18 mmol/L (ref 1.15–1.40)
Calcium, Ion: 0.84 mmol/L — CL (ref 1.15–1.40)
Calcium, Ion: 1.06 mmol/L — ABNORMAL LOW (ref 1.15–1.40)
Calcium, Ion: 1.36 mmol/L (ref 1.15–1.40)
Chloride: 105 mmol/L (ref 98–111)
Chloride: 100 mmol/L (ref 98–111)
Chloride: 107 mmol/L (ref 98–111)
Chloride: 109 mmol/L (ref 98–111)
Creatinine, Ser: 0.6 mg/dL (ref 0.44–1.00)
Creatinine, Ser: 0.5 mg/dL (ref 0.44–1.00)
Creatinine, Ser: 0.6 mg/dL (ref 0.44–1.00)
Creatinine, Ser: 0.5 mg/dL (ref 0.44–1.00)
Glucose, Bld: 100 mg/dL — ABNORMAL HIGH (ref 70–99)
Glucose, Bld: 89 mg/dL (ref 70–99)
Glucose, Bld: 136 mg/dL — ABNORMAL HIGH (ref 70–99)
Glucose, Bld: 111 mg/dL — ABNORMAL HIGH (ref 70–99)
HCT: 35 % — ABNORMAL LOW (ref 36.0–46.0)
HCT: 26 % — ABNORMAL LOW (ref 36.0–46.0)
HCT: 24 % — ABNORMAL LOW (ref 36.0–46.0)
HCT: 26 % — ABNORMAL LOW (ref 36.0–46.0)
Hemoglobin: 11.9 g/dL — ABNORMAL LOW (ref 12.0–15.0)
Hemoglobin: 8.8 g/dL — ABNORMAL LOW (ref 12.0–15.0)
Hemoglobin: 8.2 g/dL — ABNORMAL LOW (ref 12.0–15.0)
Hemoglobin: 8.8 g/dL — ABNORMAL LOW (ref 12.0–15.0)
Potassium: 3.7 mmol/L (ref 3.5–5.1)
Potassium: 3.9 mmol/L (ref 3.5–5.1)
Potassium: 4.8 mmol/L (ref 3.5–5.1)
Potassium: 3.9 mmol/L (ref 3.5–5.1)
Sodium: 141 mmol/L (ref 135–145)
Sodium: 142 mmol/L (ref 135–145)
Sodium: 139 mmol/L (ref 135–145)
Sodium: 140 mmol/L (ref 135–145)
TCO2: 27 mmol/L (ref 22–32)
TCO2: 30 mmol/L (ref 22–32)
TCO2: 29 mmol/L (ref 22–32)
TCO2: 27 mmol/L (ref 22–32)

## 2019-01-22 LAB — CBC
HCT: 31.8 % — ABNORMAL LOW (ref 36.0–46.0)
Hemoglobin: 10 g/dL — ABNORMAL LOW (ref 12.0–15.0)
MCH: 28.4 pg (ref 26.0–34.0)
MCHC: 31.4 g/dL (ref 30.0–36.0)
MCV: 90.3 fL (ref 80.0–100.0)
Platelets: 235 10*3/uL (ref 150–400)
RBC: 3.52 MIL/uL — ABNORMAL LOW (ref 3.87–5.11)
RDW: 13.7 % (ref 11.5–15.5)
WBC: 12.8 10*3/uL — ABNORMAL HIGH (ref 4.0–10.5)
nRBC: 0.4 % — ABNORMAL HIGH (ref 0.0–0.2)

## 2019-01-22 LAB — BASIC METABOLIC PANEL
Anion gap: 13 (ref 5–15)
BUN: 11 mg/dL (ref 6–20)
CO2: 23 mmol/L (ref 22–32)
Calcium: 8.7 mg/dL — ABNORMAL LOW (ref 8.9–10.3)
Chloride: 103 mmol/L (ref 98–111)
Creatinine, Ser: 0.98 mg/dL (ref 0.44–1.00)
GFR calc Af Amer: >60 mL/min (ref >60)
GFR calc non Af Amer: >60 mL/min (ref >60)
Glucose, Bld: 89 mg/dL (ref 70–99)
Potassium: 4.2 mmol/L (ref 3.5–5.1)
Sodium: 139 mmol/L (ref 135–145)

## 2019-01-22 LAB — POCT I-STAT 7, (LYTES, BLD GAS, ICA,H+H)
Acid-Base Excess: 5 mmol/L — ABNORMAL HIGH (ref 0.0–2.0)
Acid-Base Excess: 1 mmol/L (ref 0.0–2.0)
Bicarbonate: 27.6 mmol/L (ref 20.0–28.0)
Bicarbonate: 25.4 mmol/L (ref 20.0–28.0)
Calcium, Ion: 1.06 mmol/L — ABNORMAL LOW (ref 1.15–1.40)
Calcium, Ion: 1.35 mmol/L (ref 1.15–1.40)
HCT: 26 % — ABNORMAL LOW (ref 36.0–46.0)
HCT: 28 % — ABNORMAL LOW (ref 36.0–46.0)
Hemoglobin: 8.8 g/dL — ABNORMAL LOW (ref 12.0–15.0)
Hemoglobin: 9.5 g/dL — ABNORMAL LOW (ref 12.0–15.0)
O2 Saturation: 100 %
O2 Saturation: 100 %
Potassium: 4.4 mmol/L (ref 3.5–5.1)
Potassium: 3.9 mmol/L (ref 3.5–5.1)
Sodium: 140 mmol/L (ref 135–145)
Sodium: 141 mmol/L (ref 135–145)
TCO2: 28 mmol/L (ref 22–32)
TCO2: 27 mmol/L (ref 22–32)
pCO2 arterial: 30.1 mmHg — ABNORMAL LOW (ref 32.0–48.0)
pCO2 arterial: 37.2 mmHg (ref 32.0–48.0)
pH, Arterial: 7.57 — ABNORMAL HIGH (ref 7.350–7.450)
pH, Arterial: 7.442 (ref 7.350–7.450)
pO2, Arterial: 359 mmHg — ABNORMAL HIGH (ref 83.0–108.0)
pO2, Arterial: 249 mmHg — ABNORMAL HIGH (ref 83.0–108.0)

## 2019-01-22 LAB — GLUCOSE, CAPILLARY
Glucose-Capillary: 100 mg/dL — ABNORMAL HIGH (ref 70–99)
Glucose-Capillary: 103 mg/dL — ABNORMAL HIGH (ref 70–99)
Glucose-Capillary: 125 mg/dL — ABNORMAL HIGH (ref 70–99)

## 2019-01-22 MED ORDER — METOPROLOL TARTRATE 50 MG PO TABS: 50.0000 mg | ORAL_TABLET | Freq: Two times a day (BID) | ORAL | Status: DC

## 2019-01-22 MED ORDER — METOPROLOL TARTRATE 25 MG PO TABS
25.0000 mg | ORAL_TABLET | Freq: Two times a day (BID) | ORAL | Status: DC
  Administered 2019-01-22 – 2019-01-25 (×7): 25 mg via ORAL
  Filled 2019-01-22 (×6): qty 1

## 2019-01-22 MED ORDER — METOPROLOL TARTRATE 25 MG PO TABS
25.0000 mg | ORAL_TABLET | Freq: Two times a day (BID) | ORAL | Status: DC
  Filled 2019-01-22: qty 1

## 2019-01-22 MED ORDER — FUROSEMIDE 10 MG/ML IJ SOLN
40.0000 mg | Freq: Once | INTRAMUSCULAR | Status: AC
  Administered 2019-01-22: 40 mg via INTRAVENOUS
  Filled 2019-01-22: qty 4

## 2019-01-22 MED ORDER — METOPROLOL TARTRATE 25 MG/10 ML ORAL SUSPENSION
25.0000 mg | Freq: Two times a day (BID) | ORAL | Status: DC
  Filled 2019-01-22: qty 10

## 2019-01-22 MED FILL — Mannitol IV Soln 20%: INTRAVENOUS | Qty: 2500 | Status: AC

## 2019-01-22 MED FILL — Lidocaine HCl Local Preservative Free (PF) Inj 2%: INTRAMUSCULAR | Qty: 15 | Status: AC

## 2019-01-22 MED FILL — Heparin Sodium (Porcine) Inj 1000 Unit/ML: INTRAMUSCULAR | Qty: 30 | Status: AC

## 2019-01-22 MED FILL — Electrolyte-R (PH 7.4) Solution: INTRAVENOUS | Qty: 3000 | Status: AC

## 2019-01-22 MED FILL — Potassium Chloride Inj 2 mEq/ML: INTRAVENOUS | Qty: 40 | Status: AC

## 2019-01-22 MED FILL — Sodium Bicarbonate IV Soln 8.4%: INTRAVENOUS | Qty: 50 | Status: AC

## 2019-01-22 MED FILL — Lidocaine HCl Local Soln Prefilled Syringe 100 MG/5ML (2%): INTRAMUSCULAR | Qty: 25 | Status: AC

## 2019-01-22 MED FILL — Sodium Chloride IV Soln 0.9%: INTRAVENOUS | Qty: 1000 | Status: AC

## 2019-01-22 NOTE — Progress Notes (Addendum)
      [address]       [place] [address]             [phone]        4 Days Post-Op Procedure(s) (LRB): CORONARY ARTERY BYPASS GRAFTING (CABG) X4 ON PUMP USING LEFT INTERNAL MAMMARY ARTERY AND LEFT GREATER SAPHENOUS VEIN ENDOSCOPICALLY HARVESTED GRAFTS (N/A) Transesophageal Echocardiogram (Tee)  Subjective: Patient concerned breathing is not "good". She had a bowel movement this am.  Objective: Vital signs in last 24 hours: Temp:  [97.4 F (36.3 C)-98.9 F (37.2 C)] 98 F (36.7 C) (12/14 0439) Pulse Rate:  [92-102] 100 (12/14 0439) Cardiac Rhythm: Normal sinus rhythm (12/13 1900) Resp:  [20-27] 23 (12/14 0439) BP: (116-138)/(64-77) 116/68 (12/14 0439) SpO2:  [93 %-100 %] 94 % (12/14 0439) Weight:  [84.4 kg] 84.4 kg (12/14 0439)  Pre op weight 86.2 kg Current Weight  01/22/19 84.4 kg      Intake/Output from previous day: 12/13 0701 - 12/14 0700 In: 400 [P.O.:400] Out: 0    Physical Exam:  Cardiovascular: Slightly tachycardic. Pulmonary: Diminished bibasilar breath sounds Abdomen: Soft, non tender, bowel sounds present. Extremities: Mild bilateral lower extremity edema. Wounds: LLE wounds are clean and dry.  No erythema or signs of infection. Pravena wound VAC in place on sternal incision.  Lab Results: CBC: Recent Labs    01/21/19 0220 01/22/19 0314  WBC 15.8* 12.8*  HGB 8.9* 10.0*  HCT 28.6* 31.8*  PLT 244 235   BMET:  Recent Labs    01/21/19 0220 01/22/19 0314  NA 139 139  K 4.6 4.2  CL 102 103  CO2 27 23  GLUCOSE 105* 89  BUN 10 11  CREATININE 0.92 0.98  CALCIUM 8.5* 8.7*    PT/INR:  Lab Results  Component Value Date   INR 1.4 (H) 01/18/2019   INR 1.0 01/17/2019   INR 1.0 01/13/2019   ABG:  INR: Will add last result for INR, ABG once components are confirmed Will add last 4 CBG results once components are confirmed  Assessment/Plan:  1. CV - Slightly tachy this am, PVCs. On Lopressor 12.5 mg bid but will  increase to 25 mg bid for better HR control. 2.  Pulmonary - On room air this am.  Xopenex tid. Portable CXR this am appears stable. Will order PA/LAT CXR for am. Encourage incentive spirometer and flutter valve. On Lovenox for DVT prophylaxis.  3. Volume Overload - On Lasix 40 mg daily 4.  Acute blood loss anemia - H and H this am stable at 10 and 31.8 5. CBGs 130/100/103. Pre op HGA1C 6.3. She likely has pre diabetes. She will need follow up with her medical doctor after discharge for further surveillance. Will stop accu checks and SS PRN for now.  Donielle M ZimmermanPA-C 01/22/2019,7:10 AM   Agree with above Metop increased to 25mg  Gave additional dose of lasix pulm toilet dispo planning  Venetta Knee O Moni Rothrock

## 2019-01-22 NOTE — Progress Notes (Signed)
CARDIAC REHAB PHASE I   PRE:  Rate/Rhythm: 84 SR    BP: sitting 107/72    SaO2: 93 RA  MODE:  Ambulation: 320 ft   POST:  Rate/Rhythm: 100 ST    BP: sitting 145/71     SaO2: 93 RA  Pt able to move to EOB and stand fairly independent. Used RW. Steady but RW difficult to move with VAC attached (will carry it tomorrow). Pt DOE with distance, rest x2. To recliner. Encouraged more walking and IS. [place], ACSM 01/22/2019 2:38 PM

## 2019-01-22 NOTE — Progress Notes (Signed)
Progress Note  Patient Name: [name] Date of Encounter: 01/22/2019  Primary Cardiologist: Lesleigh Noe, MD   Subjective   [name] has had spotty cardiac follow-up over the past 3 to 4 years.  She has terrible risk factors and poor understanding of her underlying condition.  On this occasion she was admitted with diffuse right coronary disease also probably significant proximal to mid LAD, diagonal, and distal circumflex disease.  She underwent coronary bypass grafting and received LIMA to LAD, SVG to PDA, SVG to OM 3, and SVG to diagonal #1.Marland Kitchen  Slightly short of breath this morning.  Inpatient Medications    Scheduled Meds: . acetaminophen  1,000 mg Oral Q6H   Or  . acetaminophen (TYLENOL) oral liquid 160 mg/5 mL  1,000 mg Per Tube Q6H  . aspirin EC  325 mg Oral Daily   Or  . aspirin  324 mg Per Tube Daily  . bisacodyl  10 mg Oral Daily   Or  . bisacodyl  10 mg Rectal Daily  . busPIRone  10 mg Oral TID  . Chlorhexidine Gluconate Cloth  6 each Topical Daily  . docusate sodium  200 mg Oral Daily  . enoxaparin (LOVENOX) injection  40 mg Subcutaneous QHS  . escitalopram  20 mg Oral Daily  . folic acid  1 mg Oral Daily  . furosemide  40 mg Intravenous Once  . furosemide  40 mg Oral Daily  . gabapentin  300 mg Oral TID  . levalbuterol  0.63 mg Nebulization TID  . metoprolol tartrate  25 mg Oral BID  . multivitamin with minerals  1 tablet Oral Daily  . nicotine  14 mg Transdermal Daily  . pantoprazole  40 mg Oral Daily  . potassium chloride  40 mEq Oral Daily  . QUEtiapine  100 mg Oral QHS  . rosuvastatin  40 mg Oral q1800  . sodium chloride flush  3 mL Intravenous Q12H  . sodium chloride flush  3 mL Intravenous Q12H  . thiamine  100 mg Oral Daily   Continuous Infusions: . sodium chloride    . sodium chloride    . sodium chloride    . sodium chloride     PRN Meds: sodium chloride, sodium chloride, ALPRAZolam, metoprolol tartrate, ondansetron (ZOFRAN) IV,  sodium chloride flush, sodium chloride flush, traMADol   Vital Signs    Vitals:   01/22/19 0024 01/22/19 0439 01/22/19 0752 01/22/19 0823  BP: 126/67 116/68  128/79  Pulse: 93 100 (!) 104 100  Resp: 20 (!) 23 (!) 33 (!) 23  Temp: (!) 97.4 F (36.3 C) 98 F (36.7 C)  99.6 F (37.6 C)  TempSrc: Oral Oral  Oral  SpO2: 93% 94% 98% 98%  Weight:  84.4 kg    Height:        Intake/Output Summary (Last 24 hours) at 01/22/2019 1105 Last data filed at 01/22/2019 0714 Gross per 24 hour  Intake 200 ml  Output 1 ml  Net 199 ml   Last 3 Weights 01/22/2019 01/21/2019 01/20/2019  Weight (lbs) 186 lb 1.1 oz 200 lb 1.6 oz 201 lb 14.4 oz  Weight (kg) 84.4 kg 90.765 kg 91.581 kg  Some encounter information is confidential and restricted. Go to Review Flowsheets activity to see all data.      Telemetry    Normal sinus rhythm- Personally Reviewed  ECG    Performed on 01/19/2019 revealed diffuse ST elevation consistent with postop pericarditis and poor R wave progression.- Personally  Reviewed  Physical Exam  Awake but somewhat anxious. GEN: No acute distress.   Neck:  Difficult to determine JVD. Cardiac: RRR, no murmurs, rubs, or gallops.  Respiratory:  Difficulty taking a deep breath.  Lungs are clear to anterior auscultation. GI: Soft, nontender, non-distended  MS: No edema; No deformity. Neuro:  Nonfocal  Psych: Normal affect   Labs    High Sensitivity Troponin:   Recent Labs  Lab 01/11/19 2313 01/12/19 0212 01/12/19 1051 01/12/19 1908 01/12/19 2111  TROPONINIHS 12 32* 191* 515* 572*      Chemistry Recent Labs  Lab 01/17/19 1626 01/20/19 0224 01/21/19 0220 01/22/19 0314  NA 141 138 139 139  K 3.5 4.7 4.6 4.2  CL 106 105 102 103  CO2 25 22 27 23   GLUCOSE 124* 95 105* 89  BUN 10 10 10 11   CREATININE 0.87 0.97 0.92 0.98  CALCIUM 9.1 8.6* 8.5* 8.7*  PROT 6.1*  --   --   --   ALBUMIN 3.1*  --   --   --   AST 54*  --   --   --   ALT 49*  --   --   --   ALKPHOS  61  --   --   --   BILITOT 0.1*  --   --   --   GFRNONAA >60 >60 >60 >60  GFRAA >60 >60 >60 >60  ANIONGAP 10 11 10 13      Hematology Recent Labs  Lab 01/20/19 0224 01/21/19 0220 01/22/19 0314  WBC 14.6* 15.8* 12.8*  RBC 3.58* 3.12* 3.52*  HGB 10.1* 8.9* 10.0*  HCT 33.1* 28.6* 31.8*  MCV 92.5 91.7 90.3  MCH 28.2 28.5 28.4  MCHC 30.5 31.1 31.4  RDW 13.5 13.4 13.7  PLT 204 244 235    BNPNo results for input(s): BNP, PROBNP in the last 168 hours.   DDimer No results for input(s): DDIMER in the last 168 hours.   Radiology    DG Chest Port 1 View In am  Result Date: 01/22/2019 CLINICAL DATA:  CABG.  Shortness of breath EXAM: PORTABLE CHEST 1 VIEW COMPARISON:  Two days ago FINDINGS: Cardiomegaly and CABG. Stable mediastinal contours. Mild streaky opacity behind the heart that is stable. No edema, effusion, or pneumothorax. IMPRESSION: 1. Retrocardiac atelectasis. 2. Stable cardiac enlargement without failure. Electronically Signed   By: Monte Fantasia M.D.   On: 01/22/2019 07:21    Cardiac Studies    Presurgical coronary angio reviewed.  Patient Profile     53 y.o. female with history of bipolar disorder, CAD s/p remote stent of RCA and LCx in 2012, ongoing tobacco and Etoh abuse, adverse social determinants of health preventing adequate follow-up, who presented with unstable angina and had coronary bypass grafting x4 performed this admission.   Assessment & Plan    1. Mildly short of breath.  She is receiving IV Lasix.  This will likely help dyspnea.   Chest x-ray does not reveal significant volume overload.  She does have atelectasis on chest x-ray from this morning. Increase incentive spirometry.  Ambulate as tolerated. 2. Hyperlipidemia and will require an LDL less than 70.  Agree with rosuvastatin 40 mg daily.  Prior report that statins made her feel jittery would not be a reason to avoid use. 3. Essential hypertension: Beta-blocker therapy has been instituted.  If  additional therapy needed as an outpatient, and angiotensin receptor blocker or ACE inhibitor would provide preventive protection. 4. Obstructive sleep apnea: This  is likely a significant issue with her that has not been addressed because of poor follow-up 5. Adverse social determinants of health: We will need all the resources possible to help get her on the right track and prevent recurrent admissions.        For questions or updates, please contact [facility] Please consult [email or web address] for contact info under        Signed, Lesleigh NoeHenry W Xochilt Conant III, MD  01/22/2019, 11:05 AM

## 2019-01-23 ENCOUNTER — Inpatient Hospital Stay (HOSPITAL_COMMUNITY): Payer: Self-pay

## 2019-01-23 NOTE — Progress Notes (Signed)
CARDIAC REHAB PHASE I   Offered to walk with pt. Pt states she recently returned from walking with physical therapy. Pt states she continues to feel stronger, but still gets SOB when walking. Encouraged continued walks, and IS use. Pt currently getting 625 on IS. Will continue to follow.  [phone] Rufina Falco, RN BSN 01/23/2019 1:54 PM

## 2019-01-23 NOTE — Progress Notes (Signed)
CARDIAC REHAB PHASE I   Offered to walk with pt. Pt declining at this time stating she is trying to get a nap. Pt states she may be willing to walk later this afternoon. Will continue to follow and encourage ambulation.  Rufina Falco, RN BSN 01/23/2019 10:34 AM

## 2019-01-23 NOTE — Progress Notes (Addendum)
The patient has been seen in conjunction with Laverda PageLindsay Roberts, NP. All aspects of care have been considered and discussed. The patient has been personally interviewed, examined, and all clinical data has been reviewed.   We will work hard to address risk factors as an outpatient while at the same time encouraging compliance and long-term follow-up.   Progress Note  Patient Name: [name] Date of Encounter: 01/23/2019  Primary Cardiologist: Lesleigh NoeHenry W Roper Tolson III, MD   Subjective   Feeling well this morning. Breathing has significantly improved.   Inpatient Medications    Scheduled Meds: . acetaminophen  1,000 mg Oral Q6H   Or  . acetaminophen (TYLENOL) oral liquid 160 mg/5 mL  1,000 mg Per Tube Q6H  . aspirin EC  325 mg Oral Daily   Or  . aspirin  324 mg Per Tube Daily  . bisacodyl  10 mg Oral Daily   Or  . bisacodyl  10 mg Rectal Daily  . busPIRone  10 mg Oral TID  . Chlorhexidine Gluconate Cloth  6 each Topical Daily  . docusate sodium  200 mg Oral Daily  . enoxaparin (LOVENOX) injection  40 mg Subcutaneous QHS  . escitalopram  20 mg Oral Daily  . folic acid  1 mg Oral Daily  . furosemide  40 mg Oral Daily  . gabapentin  300 mg Oral TID  . levalbuterol  0.63 mg Nebulization TID  . metoprolol tartrate  25 mg Oral BID  . multivitamin with minerals  1 tablet Oral Daily  . nicotine  14 mg Transdermal Daily  . pantoprazole  40 mg Oral Daily  . potassium chloride  40 mEq Oral Daily  . QUEtiapine  100 mg Oral QHS  . rosuvastatin  40 mg Oral q1800  . sodium chloride flush  3 mL Intravenous Q12H  . sodium chloride flush  3 mL Intravenous Q12H  . thiamine  100 mg Oral Daily   Continuous Infusions: . sodium chloride    . sodium chloride    . sodium chloride    . sodium chloride     PRN Meds: sodium chloride, sodium chloride, ALPRAZolam, metoprolol tartrate, ondansetron (ZOFRAN) IV, sodium chloride flush, sodium chloride flush, traMADol   Vital Signs    Vitals:   01/22/19 2054 01/22/19 2356 01/23/19 0426 01/23/19 0843  BP: 118/68 (!) 149/84 116/71 120/79  Pulse: 92 81 87 94  Resp: (!) 21 (!) 21 (!) 23   Temp: 98.8 F (37.1 C) (!) 100.7 F (38.2 C) 98 F (36.7 C)   TempSrc: Oral Oral Oral   SpO2: 95% 91% 95%   Weight:   87.2 kg   Height:        Intake/Output Summary (Last 24 hours) at 01/23/2019 0919 Last data filed at 01/23/2019 0700 Gross per 24 hour  Intake 251.31 ml  Output 0 ml  Net 251.31 ml   Last 3 Weights 01/23/2019 01/22/2019 01/21/2019  Weight (lbs) 192 lb 3.2 oz 186 lb 1.1 oz 200 lb 1.6 oz  Weight (kg) 87.181 kg 84.4 kg 90.765 kg  Some encounter information is confidential and restricted. Go to Review Flowsheets activity to see all data.      Telemetry    SR - Personally Reviewed  ECG    No new tracing.  Physical Exam  Pleasant AAF, sitting up in bed. GEN: No acute distress.   Neck: No JVD Cardiac: RRR, no murmurs. Sternal wound vac in place. Respiratory: Slightly diminished in bases. GI: Soft, nontender,  non-distended  MS: No edema; No deformity. Neuro:  Nonfocal  Psych: Normal affect   Labs    High Sensitivity Troponin:   Recent Labs  Lab 01/11/19 2313 01/12/19 0212 01/12/19 1051 01/12/19 1908 01/12/19 2111  TROPONINIHS 12 32* 191* 515* 572*      Chemistry Recent Labs  Lab 01/17/19 1626 01/20/19 0224 01/21/19 0220 01/22/19 0314  NA 141 138 139 139  K 3.5 4.7 4.6 4.2  CL 106 105 102 103  CO2 25 22 27 23   GLUCOSE 124* 95 105* 89  BUN 10 10 10 11   CREATININE 0.87 0.97 0.92 0.98  CALCIUM 9.1 8.6* 8.5* 8.7*  PROT 6.1*  --   --   --   ALBUMIN 3.1*  --   --   --   AST 54*  --   --   --   ALT 49*  --   --   --   ALKPHOS 61  --   --   --   BILITOT 0.1*  --   --   --   GFRNONAA >60 >60 >60 >60  GFRAA >60 >60 >60 >60  ANIONGAP 10 11 10 13      Hematology Recent Labs  Lab 01/20/19 0224 01/21/19 0220 01/22/19 0314  WBC 14.6* 15.8* 12.8*  RBC 3.58* 3.12* 3.52*  HGB 10.1* 8.9* 10.0*    HCT 33.1* 28.6* 31.8*  MCV 92.5 91.7 90.3  MCH 28.2 28.5 28.4  MCHC 30.5 31.1 31.4  RDW 13.5 13.4 13.7  PLT 204 244 235    BNPNo results for input(s): BNP, PROBNP in the last 168 hours.   DDimer No results for input(s): DDIMER in the last 168 hours.   Radiology    DG Chest 2 View  Result Date: 01/23/2019 CLINICAL DATA:  Status post CABG surgery, 01/18/2019. EXAM: CHEST - 2 VIEW COMPARISON:  01/22/2019 older exams. FINDINGS: Stable changes from the recent CABG surgery. Cardiac silhouette normal in size. No mediastinal widening. Retrocardiac left lung base atelectasis suspect small pleural effusions. Lungs otherwise clear. No pneumothorax. IMPRESSION: 1. No acute findings or evidence of an operative complication. 2. Persistent opacity in retrocardiac lower lung zone consistent with atelectasis. Probable small pleural effusions. Electronically Signed   By: 01/25/2019 M.D.   On: 01/23/2019 08:00   DG Chest Port 1 View In am  Result Date: 01/22/2019 CLINICAL DATA:  CABG.  Shortness of breath EXAM: PORTABLE CHEST 1 VIEW COMPARISON:  Two days ago FINDINGS: Cardiomegaly and CABG. Stable mediastinal contours. Mild streaky opacity behind the heart that is stable. No edema, effusion, or pneumothorax. IMPRESSION: 1. Retrocardiac atelectasis. 2. Stable cardiac enlargement without failure. Electronically Signed   By: Amie Portland M.D.   On: 01/22/2019 07:21    Cardiac Studies   TTE: 01/16/19  IMPRESSIONS    1. Left ventricular ejection fraction, by visual estimation, is 65 to 70%. The left ventricle has hyperdynamic function. There is moderately increased left ventricular hypertrophy.  2. Left ventricular diastolic parameters are consistent with Grade I diastolic dysfunction (impaired relaxation).  3. The left ventricle has no regional wall motion abnormalities.  4. Global right ventricle has normal systolic function.The right ventricular size is normal. No increase in right ventricular  wall thickness.  5. Left atrial size was normal.  6. Right atrial size was normal.  7. The mitral valve is normal in structure. No evidence of mitral valve regurgitation. No evidence of mitral stenosis.  8. The tricuspid valve is normal in structure. Tricuspid  valve regurgitation is not demonstrated.  9. The aortic valve was not well visualized. Aortic valve regurgitation is not visualized. No evidence of aortic valve sclerosis or stenosis. 10. TR signal is inadequate for assessing pulmonary artery systolic pressure.   Patient Profile     53 y.o. female  with history of bipolar disorder, CAD s/p remote stent of RCA and LCx in 2012, ongoing tobacco and Etoh abuse, adverse social determinants of health preventing adequate follow-up, who presented with unstable angina and had coronary bypass grafting x4 performed this admission.  Assessment & Plan    1. CAD: s/p CABG x4. Progressing well with CR. Shortness of breath much improved today. Continues to ambulate well. On ASA (full dose), statin, BB and lasix. Did have elevated troponin on admission. Consider plavix at follow up appt.  -- sternal wound vac in place. Possible removal today or tomorrow per TCTS.   2. HL: had not been on statin prior to admission. Now on crestor 40mg  daily.  -- FLP/LFTs 8 weeks  3. HTN: controlled with BB therapy  4. Possible OSA: has not followed up in the office in the past. Can plan to address in outpatient follow up.   5. Noncompliance: stressed to importance of medication compliance and maintaining follow up visits.   CARDIOLOGY RECOMMENDATIONS:  Discharge is anticipated in the next 48 hours. Recommendations for medications and follow up:  Discharge Medications: Continue medications as they are currently listed in the [facility]. Exceptions to the above:  Noted above.  Follow Up: The patient's Primary Cardiologist is Sinclair Grooms, MD  Follow up in the office in 2 weeks  For questions or updates,  please contact [place] Please consult [email or web address] for contact info under   Signed, Reino Bellis, NP  01/23/2019, 9:19 AM

## 2019-01-23 NOTE — Progress Notes (Signed)
Physical Therapy Treatment Patient Details Name: [name] MRN: [id number] DOB: Oct 08, 1965 Today's Date: 01/23/2019    History of Present Illness 53 y.o. female with known CAD and history of RCA stenting and left circumflex stenting in 2012, presenting with non-STEMI.  Patient with history of alcohol and tobacco abuse as well as medical noncompliance. 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 She is now s/p CABG 01/18/19.    PT Comments    Pt has been refusing to amb with PT and cardiac rehab however participated without hesitation today. Pt assisted to the bathroom, re-educated on sternal precautions, and amb 175' on RA with RW. Pt progressing towards all goals. Acute PT to cont to follow.    Follow Up Recommendations  Other (comment)(cardiac rehab when appropriate)     Equipment Recommendations       Recommendations for Other Services       Precautions / Restrictions Precautions Precautions: Sternal Precaution Comments: pt educated on sternal precautions, pt with verbal understanding however doesn't adhere functionally Restrictions Weight Bearing Restrictions: Yes Other Position/Activity Restrictions: sternal precautions    Mobility  Bed Mobility Overal bed mobility: Modified Independent             General bed mobility comments: HOB up, used rail  Transfers Overall transfer level: Needs assistance Equipment used: Rolling walker (2 wheeled) Transfers: Sit to/from Stand Sit to Stand: Supervision         General transfer comment: verbal cues to not push up with hands to keep hands on knees and push up with legs to adhere to sternal precautions  Ambulation/Gait Ambulation/Gait assistance: Min guard Gait Distance (Feet): 175 Feet Assistive device: Rolling walker (2 wheeled) Gait Pattern/deviations: Step-through pattern;Decreased stride length Gait velocity: decreased Gait velocity interpretation: 1.31 - 2.62 ft/sec, indicative of limited community  ambulator General Gait Details: verbal cues to stay in walker and not push down through UEs to only rest hands on walker to use for balance. pt with 2 standing rest breaks. pt diaphoretic but unable to capture pulse ox reading on RA as it wouldn't read, upon return to the room pt was at >94% on RA   Stairs             Wheelchair Mobility    Modified Rankin (Stroke Patients Only)       Balance Overall balance assessment: Mild deficits observed, not formally tested                                          Cognition Arousal/Alertness: Awake/alert Behavior During Therapy: Flat affect Overall Cognitive Status: Within Functional Limits for tasks assessed                                 General Comments: pt delayed but feel this is baseline personality      Exercises      General Comments General comments (skin integrity, edema, etc.): pt assisted to commode, pt indep with hygiene however required verbal cues to wipe from the front and not reach around back due to sternal precautions      Pertinent Vitals/Pain Pain Assessment: 0-10 Pain Score: 2  Pain Location: chest incision Pain Descriptors / Indicators: Sore Pain Intervention(s): Monitored during session    Home Living  Prior Function            PT Goals (current goals can now be found in the care plan section) Progress towards PT goals: Progressing toward goals    Frequency    Min 3X/week      PT Plan Current plan remains appropriate    Co-evaluation              AM-PAC PT "6 Clicks" Mobility   Outcome Measure  Help needed turning from your back to your side while in a flat bed without using bedrails?: None Help needed moving from lying on your back to sitting on the side of a flat bed without using bedrails?: None Help needed moving to and from a bed to a chair (including a wheelchair)?: A Little Help needed standing up from a  chair using your arms (e.g., wheelchair or bedside chair)?: A Little Help needed to walk in hospital room?: A Little Help needed climbing 3-5 steps with a railing? : A Little 6 Click Score: 20    End of Session Equipment Utilized During Treatment: Gait belt Activity Tolerance: Patient tolerated treatment well Patient left: in bed;with call bell/phone within reach Nurse Communication: Mobility status PT Visit Diagnosis: Difficulty in walking, not elsewhere classified (R26.2)     Time: 1610-9604 PT Time Calculation (min) (ACUTE ONLY): 19 min  Charges:  $Gait Training: 8-22 mins                     Lewis Shock, PT, DPT Acute Rehabilitation Services Pager #: [phone] Office #: [phone]    Iona Hansen 01/23/2019, 1:54 PM

## 2019-01-23 NOTE — Progress Notes (Signed)
      [address]       [place], [address]             [phone]        5 Days Post-Op Procedure(s) (LRB): CORONARY ARTERY BYPASS GRAFTING (CABG) X4 ON PUMP USING LEFT INTERNAL MAMMARY ARTERY AND LEFT GREATER SAPHENOUS VEIN ENDOSCOPICALLY HARVESTED GRAFTS (N/A) Transesophageal Echocardiogram (Tee)  Subjective: Patient states "Lasix helped with my breathing".  Objective: Vital signs in last 24 hours: Temp:  [97.8 F (36.6 C)-100.7 F (38.2 C)] 98 F (36.7 C) (12/15 0426) Pulse Rate:  [81-104] 87 (12/15 0426) Cardiac Rhythm: Normal sinus rhythm (12/14 1900) Resp:  [18-33] 23 (12/15 0426) BP: (107-149)/(68-84) 116/71 (12/15 0426) SpO2:  [91 %-98 %] 95 % (12/15 0426) FiO2 (%):  [21 %-100 %] 21 % (12/14 1300) Weight:  [87.2 kg] 87.2 kg (12/15 0426)  Pre op weight 86.2 kg Current Weight  01/23/19 87.2 kg      Intake/Output from previous day: 12/14 0701 - 12/15 0700 In: 451.3 [P.O.:400; I.V.:51.3] Out: 1 [Stool:1]   Physical Exam:  Cardiovascular: RRR Pulmonary: Slightly diminished bibasilar breath sounds Abdomen: Soft, non tender, bowel sounds present. Extremities: Mild bilateral lower extremity edema. Wounds: LLE wounds are clean and dry.  No erythema or signs of infection. Pravena wound VAC in place on sternal incision.  Lab Results: CBC: Recent Labs    01/21/19 0220 01/22/19 0314  WBC 15.8* 12.8*  HGB 8.9* 10.0*  HCT 28.6* 31.8*  PLT 244 235   BMET:  Recent Labs    01/21/19 0220 01/22/19 0314  NA 139 139  K 4.6 4.2  CL 102 103  CO2 27 23  GLUCOSE 105* 89  BUN 10 11  CREATININE 0.92 0.98  CALCIUM 8.5* 8.7*    PT/INR:  Lab Results  Component Value Date   INR 1.4 (H) 01/18/2019   INR 1.0 01/17/2019   INR 1.0 01/13/2019   ABG:  INR: Will add last result for INR, ABG once components are confirmed Will add last 4 CBG results once components are confirmed  Assessment/Plan:  1. CV - SR. On Lopressor 25 mg bid. 2.   Pulmonary - On room air this am.  Xopenex tid.  PA/LAT CXR this am appears stable this am. Encourage incentive spirometer and flutter valve. On Lovenox for DVT prophylaxis. Of note, likely has OSA but she has not followed up on evaluation and management of this. 3. Volume Overload - On Lasix 40 mg daily 4.  Acute blood loss anemia - Last H and H  stable at 10 and 31.8 5. She has a Praveena wound VAC on sternal wound. Hope to remove either today or in am. 6. Likely discharge in am  Keone Kamer M ZimmermanPA-C 01/23/2019,7:09 AM

## 2019-01-24 LAB — URINALYSIS, ROUTINE W REFLEX MICROSCOPIC
Bilirubin Urine: NEGATIVE
Glucose, UA: NEGATIVE mg/dL
Hgb urine dipstick: NEGATIVE
Ketones, ur: NEGATIVE mg/dL
Nitrite: NEGATIVE
Protein, ur: 30 mg/dL — AB
Specific Gravity, Urine: 1.015 (ref 1.005–1.030)
pH: 6 (ref 5.0–8.0)

## 2019-01-24 LAB — URINALYSIS, MICROSCOPIC (REFLEX)

## 2019-01-24 LAB — CBC
HCT: 27.9 % — ABNORMAL LOW (ref 36.0–46.0)
Hemoglobin: 8.9 g/dL — ABNORMAL LOW (ref 12.0–15.0)
MCH: 28 pg (ref 26.0–34.0)
MCHC: 31.9 g/dL (ref 30.0–36.0)
MCV: 87.7 fL (ref 80.0–100.0)
Platelets: 388 10*3/uL (ref 150–400)
RBC: 3.18 MIL/uL — ABNORMAL LOW (ref 3.87–5.11)
RDW: 13.6 % (ref 11.5–15.5)
WBC: 11.3 10*3/uL — ABNORMAL HIGH (ref 4.0–10.5)
nRBC: 0.6 % — ABNORMAL HIGH (ref 0.0–0.2)

## 2019-01-24 MED ORDER — ENSURE ENLIVE PO LIQD: 237.0000 mL | Freq: Two times a day (BID) | ORAL | Status: DC

## 2019-01-24 MED ORDER — FERROUS SULFATE 325 (65 FE) MG PO TABS
325.0000 mg | ORAL_TABLET | Freq: Every day | ORAL | Status: DC
  Administered 2019-01-25: 325 mg via ORAL
  Filled 2019-01-24: qty 1

## 2019-01-24 MED ORDER — FUROSEMIDE 40 MG PO TABS
40.0000 mg | ORAL_TABLET | Freq: Every day | ORAL | Status: DC
  Administered 2019-01-25: 09:00:00 40 mg via ORAL
  Filled 2019-01-24: qty 1

## 2019-01-24 MED ORDER — FUROSEMIDE 10 MG/ML IJ SOLN
40.0000 mg | Freq: Once | INTRAMUSCULAR | Status: AC
  Administered 2019-01-24: 40 mg via INTRAVENOUS
  Filled 2019-01-24: qty 4

## 2019-01-24 NOTE — Progress Notes (Signed)
Initial Nutrition Assessment  DOCUMENTATION CODES:   Obesity unspecified  INTERVENTION:    Ensure Enlive po BID, each supplement provides 350 kcal and 20 grams of protein  MVI daily   NUTRITION DIAGNOSIS:   Increased nutrient needs related to post-op healing as evidenced by estimated needs.  GOAL:   Patient will meet greater than or equal to 90% of their needs  MONITOR:   PO intake, Supplement acceptance, Weight trends, I & O's, Labs, Skin  REASON FOR ASSESSMENT:   Consult Diet education  ASSESSMENT:   Patient with PMH significant for CAD s/p stenting, HTN, HLD, ETOH abuse, and bipolar disorder. Presents this admission with ACS history of CAD.  12/10- CABG x4  Unable to reach pt via phone. Education was attempted on 12/8 but pt did not answer phone. Originally scheduled to d/c today but was febrile over night. Has Praveena wound VAC on er sternal wound. Meal completions charted as 25-75% fore her last eight meals. RD to send supplementation to promote post-op healing.   Admission weight: 84.8 kg Current weight: 88.4 kg   Medications: dulcolax, colace, folic acid, 40 mg lasix daily, MVI with minerals, 40 mEq KCl daily, thiamine   Labs: CBG 100-130   Diet Order:   Diet Order            Diet Carb Modified Fluid consistency: Thin; Room service appropriate? Yes  Diet effective now              EDUCATION NEEDS:   Not appropriate for education at this time  Skin:  Skin Assessment: Skin Integrity Issues: Skin Integrity Issues:: Incisions, Wound VAC Wound Vac: sternal wound Incisions: L leg, chest  Last BM:  12/14  Height:   Ht Readings from Last 1 Encounters:  01/18/19 5\' 1"  (1.549 m)    Weight:   Wt Readings from Last 1 Encounters:  01/24/19 88.4 kg    Ideal Body Weight:  47.7 kg  BMI:  Body mass index is 36.82 kg/m.  Estimated Nutritional Needs:   Kcal:  1600-1800 kcal  Protein:  80-95 grams  Fluid:  >/= 1.6 L/day   Mariana Single  RD, LDN Clinical Nutrition Pager # - [phone]

## 2019-01-24 NOTE — Progress Notes (Signed)
CARDIAC REHAB PHASE I   PRE:  Rate/Rhythm: 84 SR  BP:  Sitting: 118/73      SaO2: 100 RA  MODE:  Ambulation: 400 ft   POST:  Rate/Rhythm: 100 ST  BP:  Sitting: 146/75    SaO2: 97 RA  Pt ambulated 430ft in hallway standby assist with steady gait. Pt returned to recliner. Encouraged another walk before bed and IS use. Will continue to follow and encourage ambulation.  [phone] Rufina Falco, RN BSN 01/24/2019 3:03 PM

## 2019-01-24 NOTE — Progress Notes (Signed)
   No rhythm disturbance.  Vital signs stable.  No new recommendations

## 2019-01-24 NOTE — Progress Notes (Addendum)
      [address]       [place] [address]             [phone]        6 Days Post-Op Procedure(s) (LRB): CORONARY ARTERY BYPASS GRAFTING (CABG) X4 ON PUMP USING LEFT INTERNAL MAMMARY ARTERY AND LEFT GREATER SAPHENOUS VEIN ENDOSCOPICALLY HARVESTED GRAFTS (N/A) Transesophageal Echocardiogram (Tee)  Subjective: Patient tired this am. She is concerned about her fever, infection.  Objective: Vital signs in last 24 hours: Temp:  [98.5 F (36.9 C)-101.3 F (38.5 C)] 101.3 F (38.5 C) (12/16 0621) Pulse Rate:  [87-94] 88 (12/16 0621) Cardiac Rhythm: Normal sinus rhythm (12/15 1902) Resp:  [22-25] 25 (12/16 0621) BP: (108-131)/(69-83) 108/75 (12/16 0621) SpO2:  [93 %-98 %] 97 % (12/16 0621) Weight:  [88.4 kg] 88.4 kg (12/16 0621)  Pre op weight 86.2 kg Current Weight  01/24/19 88.4 kg      Intake/Output from previous day: No intake/output data recorded.   Physical Exam:  Cardiovascular: RRR Pulmonary: Slightly diminished bibasilar breath sounds Abdomen: Soft, non tender, bowel sounds present. Extremities: Mild bilateral lower extremity edema. Wounds: LLE wounds are clean and dry.  No erythema or signs of infection. Pravena wound VAC in place on sternal incision.  Lab Results: CBC: Recent Labs    01/22/19 0314 01/24/19 0341  WBC 12.8* 11.3*  HGB 10.0* 8.9*  HCT 31.8* 27.9*  PLT 235 388   BMET:  Recent Labs    01/22/19 0314  NA 139  K 4.2  CL 103  CO2 23  GLUCOSE 89  BUN 11  CREATININE 0.98  CALCIUM 8.7*    PT/INR:  Lab Results  Component Value Date   INR 1.4 (H) 01/18/2019   INR 1.0 01/17/2019   INR 1.0 01/13/2019   ABG:  INR: Will add last result for INR, ABG once components are confirmed Will add last 4 CBG results once components are confirmed  Assessment/Plan:  1. CV - SR. On Lopressor 25 mg bid. 2.  Pulmonary - Intermittently on 2 liters of oxygen via [place];she was on room air this am. Xopenex tid. Encourage  incentive spirometer and flutter valve. On Lovenox for DVT prophylaxis. Of note, likely has OSA but she has not followed up on evaluation and management of this. 3. Volume Overload - On Lasix 40 mg daily but will give IV this am 4.  Acute blood loss anemia - H and H this am decreased to 8.9 and 27.9. Start oral Ferrous 5. Fever to 101.3. WBC this am 11,300 (was 12,800 on 12/14). UA ordered yesterday, but not done yet. CXR done yesterday showed atelectasis . She has a Praveena wound VAC on sternal wound. Hope to remove later this am to make sure no sign of wound infection. There is no sign of infection of LLE wounds.  Donielle M ZimmermanPA-C 01/24/2019,7:12 AM   Agree with above Febrile overnight Likely atelectasis Encouraged more ambulation Prevena removed  Ari Bernabei O Haru Shaff

## 2019-01-25 MED ORDER — BUSPIRONE HCL 10 MG PO TABS: 10.0000 mg | ORAL_TABLET | Freq: Three times a day (TID) | ORAL | Status: DC

## 2019-01-25 MED ORDER — SULFAMETHOXAZOLE-TRIMETHOPRIM 800-160 MG PO TABS: 1.0000 | ORAL_TABLET | Freq: Two times a day (BID) | ORAL | Status: DC

## 2019-01-25 MED ORDER — NICOTINE 14 MG/24HR TD PT24: 14.0000 mg | MEDICATED_PATCH | Freq: Every day | TRANSDERMAL | 0 refills | Status: DC

## 2019-01-25 MED ORDER — METOPROLOL TARTRATE 25 MG PO TABS: 25.0000 mg | ORAL_TABLET | Freq: Two times a day (BID) | ORAL | 1 refills | Status: DC

## 2019-01-25 MED ORDER — FERROUS SULFATE 325 (65 FE) MG PO TABS: 325.0000 mg | ORAL_TABLET | Freq: Every day | ORAL | 3 refills | Status: DC

## 2019-01-25 MED ORDER — ASPIRIN 325 MG PO TBEC: 325.0000 mg | DELAYED_RELEASE_TABLET | Freq: Every day | ORAL | 0 refills | Status: DC

## 2019-01-25 MED ORDER — TRAMADOL HCL 50 MG PO TABS: 50.0000 mg | ORAL_TABLET | Freq: Four times a day (QID) | ORAL | 0 refills | Status: DC | PRN

## 2019-01-25 MED ORDER — AMOXICILLIN-POT CLAVULANATE 875-125 MG PO TABS: 1.0000 | ORAL_TABLET | Freq: Two times a day (BID) | ORAL | 0 refills | Status: DC

## 2019-01-25 MED ORDER — POTASSIUM CHLORIDE CRYS ER 20 MEQ PO TBCR: 20.0000 meq | EXTENDED_RELEASE_TABLET | Freq: Every day | ORAL | 0 refills | Status: DC

## 2019-01-25 MED ORDER — FUROSEMIDE 40 MG PO TABS: 40.0000 mg | ORAL_TABLET | Freq: Every day | ORAL | 0 refills | Status: DC

## 2019-01-25 MED ORDER — ROSUVASTATIN CALCIUM 40 MG PO TABS: 40.0000 mg | ORAL_TABLET | Freq: Every day | ORAL | 1 refills | Status: DC

## 2019-01-25 MED ORDER — AMOXICILLIN-POT CLAVULANATE 875-125 MG PO TABS
1.0000 | ORAL_TABLET | Freq: Two times a day (BID) | ORAL | Status: DC
  Administered 2019-01-25: 09:00:00 1 via ORAL
  Filled 2019-01-25: qty 1

## 2019-01-25 MED FILL — traMADol HCL 50 MG TABS: 50 | 7 days supply | Qty: 30 | Fill #0

## 2019-01-25 MED FILL — ROSUVASTATIN CALCIUM 40 MG: 40 | 30 days supply | Qty: 30 | Fill #0

## 2019-01-25 MED FILL — AMOX-CLAV 875-125 MG TABLET: 875-125 | 5 days supply | Qty: 9 | Fill #0

## 2019-01-25 MED FILL — METOPROLOL TARTRATE 25 MG T: 25 | 30 days supply | Qty: 60 | Fill #0

## 2019-01-25 MED FILL — POTASSIUM CHLORIDE 20meq ER: 20 | 7 days supply | Qty: 7 | Fill #0

## 2019-01-25 MED FILL — FUROSEMIDE 40 MG TABLET: 40 | 7 days supply | Qty: 7 | Fill #0

## 2019-01-25 NOTE — TOC Progression Note (Signed)
Transition of Care [facility]) - Progression Note    Patient Details  Name: [name] MRN: [id number] Date of Birth: February 19, 1965  [facility] of [facility] CM/SW [place], [place] Phone Number: 01/25/2019, 11:36 AM  Clinical Narrative:     Patient was seen on 12/6 by CSW Vallery [place] and was offered SA resources.   Clinical Social Worker signing off.   Thurmond Butts, MSW, [facility] Clinical Social Worker [phone]    Expected Discharge Plan: Home/Self Care Barriers to Discharge: No Barriers Identified, Barriers Resolved  Expected Discharge Plan and Services Expected Discharge Plan: Home/Self Care In-house Referral: NA Discharge Planning Services: CM Consult   Living arrangements for the past 2 months: Single Family Home Expected Discharge Date: 01/25/19               DME Arranged: N/A DME Agency: NA       HH Arranged: NA HH Agency: NA         Social Determinants of Health (SDOH) Interventions    Readmission Risk Interventions Readmission Risk Prevention Plan 01/25/2019 01/17/2019  Transportation Screening Complete Complete  PCP or Specialist Appt within 3-5 Days - Complete  HRI or [place] - Complete  Social Work Consult for [place] Planning/Counseling - Complete  Palliative Care Screening - Not Applicable  Medication Review Press photographer) Complete Complete  PCP or Specialist appointment within 3-5 days of discharge Complete -  [place] or Home Care Consult Complete -  SW Recovery Care/Counseling Consult Complete -  Palliative Care Screening Not Applicable -  [place] Not Applicable -  Some recent data might be hidden

## 2019-01-25 NOTE — Progress Notes (Signed)
   On beta-blocker therapy, statin, and in future may consider adding ARB for CV protection.  We will be sure that she has a follow-up cardiology visit in 10 to 14 days.  Encourage compliance with medication and appointments.  This is been difficult in the past.

## 2019-01-25 NOTE — Progress Notes (Addendum)
      [address]       [place] [address]             [phone]        7 Days Post-Op Procedure(s) (LRB): CORONARY ARTERY BYPASS GRAFTING (CABG) X4 ON PUMP USING LEFT INTERNAL MAMMARY ARTERY AND LEFT GREATER SAPHENOUS VEIN ENDOSCOPICALLY HARVESTED GRAFTS (N/A) Transesophageal Echocardiogram (Tee)  Subjective: Patient states her breathing is a little "hard" when she first wakes up. She does want to go home.  Objective: Vital signs in last 24 hours: Temp:  [99.6 F (37.6 C)-100.7 F (38.2 C)] 99.8 F (37.7 C) (12/17 0539) Pulse Rate:  [85-93] 88 (12/17 0539) Cardiac Rhythm: Normal sinus rhythm (12/16 1901) Resp:  [17-24] 24 (12/17 0539) BP: (105-146)/(61-82) 120/76 (12/17 0539) SpO2:  [97 %-100 %] 100 % (12/17 0539) Weight:  [88.6 kg] 88.6 kg (12/17 0539)  Pre op weight 86.2 kg Current Weight  01/25/19 88.6 kg      Intake/Output from previous day: No intake/output data recorded.   Physical Exam:  Cardiovascular: RRR Pulmonary: Expiratory wheezing Abdomen: Soft, non tender, bowel sounds present. Extremities: Mild bilateral lower extremity edema. Wounds: LLE wounds are clean and dry.  No erythema or signs of infection. Sternal wound is clean, dry, and no sign of infection.  Lab Results: CBC: Recent Labs    01/24/19 0341  WBC 11.3*  HGB 8.9*  HCT 27.9*  PLT 388   BMET:  No results for input(s): NA, K, CL, CO2, GLUCOSE, BUN, CREATININE, CALCIUM in the last 72 hours.  PT/INR:  Lab Results  Component Value Date   INR 1.4 (H) 01/18/2019   INR 1.0 01/17/2019   INR 1.0 01/13/2019   ABG:  INR: Will add last result for INR, ABG once components are confirmed Will add last 4 CBG results once components are confirmed  Assessment/Plan:  1. CV - SR. On Lopressor 25 mg bid. 2.  Pulmonary - On room air. Xopenex tid. Encourage incentive spirometer and flutter valve. On Lovenox for DVT prophylaxis. Of note, likely has OSA but she has not  followed up on evaluation and management of this.  3. Volume Overload - On Lasix 40 mg daily but will give IV this am 4.  Acute blood loss anemia - H and H this am decreased to 8.9 and 27.9. Continue oral Ferrous 5. Fever to 101.3 12/15 . She has a low grade fever to 99.8 since then. WBC yesterday 11,300 (was 12,800 on 12/14). UA ordered 12/15 and done yesterday. UA showed many bacteria and large leukocytes. Will treat with Augmentin. 6. Discharge. LLE staples will be removed at office appointment  Mizuki Hoel M ZimmermanPA-C 01/25/2019,7:08 AM

## 2019-01-25 NOTE — TOC Transition Note (Addendum)
Transition of Care [facility]) - CM/SW Discharge Note Marvetta Gibbons RN, BSN Transitions of Care Unit 4E- RN Case Manager [phone]    Patient Details  Name: [name] MRN: [id number] Date of Birth: [birth year]  Transition of Care [facility]) CM/SW Contact:  Dawayne Patricia, RN Phone Number: 01/25/2019, 10:11 AM   Clinical Narrative:    Pt stable for transition home today, has been weaned to RA, incisional wound VAC off- son to come and transport home this afternoon. Meds have been sent to [place], pt eligible for [facility] if needed- per [name] at [place] pt has stated she can pay for her meds. TOC will deliver meds to bedside prior to discharge. PCP is [name] at the [facility]- call made to the [facility] to see about making f/u appointment for pt- msg had to be left- pt also has # to call on AVS for appointment. Pt uses the [facility] for low cost med needs. CSW to see pt regarding substance use resources.     Final next level of care: Home/Self Care Barriers to Discharge: No Barriers Identified, Barriers Resolved   Patient Goals and CMS Choice Patient states their goals for this hospitalization and ongoing recovery are:: "to get better and return home"   Choice offered to / list presented to : NA  Discharge Placement               home/ self care        Discharge Plan and Services In-house Referral: NA Discharge Planning Services: CM Consult            DME Arranged: N/A DME Agency: NA       HH Arranged: NA HH Agency: NA        Social Determinants of Health (SDOH) Interventions     Readmission Risk Interventions Readmission Risk Prevention Plan 01/25/2019 01/17/2019  Transportation Screening Complete Complete  PCP or Specialist Appt within 3-5 Days - Complete  HRI or [place] - Complete  Social Work Consult for [place] Planning/Counseling - Complete  Palliative Care Screening - Not Applicable  Medication Review Press photographer) Complete  Complete  PCP or Specialist appointment within 3-5 days of discharge Complete -  [place] or Home Care Consult Complete -  SW Recovery Care/Counseling Consult Complete -  Palliative Care Screening Not Applicable -  [place] Not Applicable -  Some recent data might be hidden

## 2019-01-25 NOTE — Progress Notes (Signed)
Physical Therapy Treatment Patient Details Name: [name] MRN: [id number] DOB: 24-Jan-1966 Today's Date: 01/25/2019    History of Present Illness 53 y.o. female with known CAD and history of RCA stenting and left circumflex stenting in 2012, presenting with non-STEMI.  Patient with history of alcohol and tobacco abuse as well as medical noncompliance. 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 She is now s/p CABG 01/18/19.    PT Comments    Pt OOB at sink upon PT arrival washing up, agreeable to PT session with focus on progressing mobility. The pt was able to demo good standing balance while working on self-care at the sink, and then continued to ambulate during session without use of AD for stability indicating progression in stability with mobility. The pt was able to ambulate in the hallway without an AD, but continued to use railing and wall for intermittent support and demos increased work of breathing while reporting no increased fatigue or shortness of breath. The pt will continue to benefit from skilled PT to address functional endurance and mobility.    Follow Up Recommendations  Other (comment)(cardiac rehab)     Equipment Recommendations  None recommended by PT    Recommendations for Other Services       Precautions / Restrictions Precautions Precautions: Sternal Precaution Comments: pt educated on sternal precautions, pt with verbal understanding however doesn't adhere functionally Restrictions Weight Bearing Restrictions: Yes RUE Weight Bearing: Non weight bearing LUE Weight Bearing: Non weight bearing Other Position/Activity Restrictions: sternal precautions    Mobility  Bed Mobility Overal bed mobility: Modified Independent             General bed mobility comments: HOB up, used rail  Transfers Overall transfer level: Needs assistance Equipment used: None Transfers: Sit to/from Stand Sit to Stand: Supervision         General transfer comment: pt  able to complete multiple sit-stands from chair in room without AD and no LOB  Ambulation/Gait Ambulation/Gait assistance: Supervision Gait Distance (Feet): 200 Feet Assistive device: None Gait Pattern/deviations: Step-through pattern;Decreased stride length Gait velocity: 0.33 m/s Gait velocity interpretation: 1.31 - 2.62 ft/sec, indicative of limited community ambulator General Gait Details: Pt able to ambulate without AD, and declines to use AD, but is consitently reaching for hand rails in the hallway. The pt took 4 standing rest breaks through the session, but reports she is not fatigued or out of breath despite visible increase in work of breathing.   Stairs             Wheelchair Mobility    Modified Rankin (Stroke Patients Only)       Balance Overall balance assessment: Mild deficits observed, not formally tested                                          Cognition Arousal/Alertness: Awake/alert Behavior During Therapy: Flat affect;Impulsive Overall Cognitive Status: Within Functional Limits for tasks assessed                                 General Comments: pt delayed but feel this is baseline personality, pt also demos poor safety understanding through session, appears to become frustrated with assist/suggestions through session, but pt moves without adherence to sternal precautions      Exercises      General Comments  Pertinent Vitals/Pain Pain Assessment: Faces Faces Pain Scale: Hurts a little bit Pain Location: chest incision Pain Descriptors / Indicators: Sore Pain Intervention(s): Limited activity within patient's tolerance;Monitored during session    Home Living                      Prior Function            PT Goals (current goals can now be found in the care plan section) Acute Rehab PT Goals Patient Stated Goal: return to school at [employer] PT Goal Formulation: With patient Time For Goal  Achievement: 02/03/19 Potential to Achieve Goals: Good Progress towards PT goals: Progressing toward goals    Frequency    Min 3X/week      PT Plan Current plan remains appropriate    Co-evaluation              AM-PAC PT "6 Clicks" Mobility   Outcome Measure  Help needed turning from your back to your side while in a flat bed without using bedrails?: None Help needed moving from lying on your back to sitting on the side of a flat bed without using bedrails?: None Help needed moving to and from a bed to a chair (including a wheelchair)?: A Little Help needed standing up from a chair using your arms (e.g., wheelchair or bedside chair)?: None Help needed to walk in hospital room?: A Little Help needed climbing 3-5 steps with a railing? : A Little 6 Click Score: 21    End of Session Equipment Utilized During Treatment: Gait belt Activity Tolerance: Patient tolerated treatment well Patient left: in bed;with call bell/phone within reach;with nursing/sitter in room Nurse Communication: Mobility status PT Visit Diagnosis: Difficulty in walking, not elsewhere classified (R26.2)     Time: 5188-4166 PT Time Calculation (min) (ACUTE ONLY): 21 min  Charges:  $Gait Training: 8-22 mins                     Karma Ganja, PT, DPT   Acute Rehabilitation Department [phone]   Otho Bellows 01/25/2019, 1:40 PM

## 2019-01-25 NOTE — Progress Notes (Signed)
CARDIAC REHAB PHASE I   D/c education completed with pt. Pt educated on importance of site care and monitoring incision daily. Encouraged continued IS use, walks, and sternal precautions. Pt given in-the-tube sheet along with heart healthy and diabetic diets. Encouraged smoking cessation, and stressed importance of not smoking while wearing the nicotine patch. Reviewed restrictions, site care, and exercise guidelines. Will refer to CRP II [place]. Pt is interested in participating in Virtual Cardiac and Pulmonary Rehab. Pt advised that Virtual Cardiac and Pulmonary Rehab is provided at no cost to the patient.  Checklist:  1. Pt has smart device  ie smartphone and/or ipad for downloading an app  Yes 2. Reliable internet/wifi service    Yes 3. Understands how to use their smartphone and navigate within an app.  Yes  Pt verbalized understanding and is in agreement.   [id number] Rufina Falco, RN BSN 01/25/2019 9:54 AM

## 2019-01-26 ENCOUNTER — Ambulatory Visit: Payer: Self-pay | Admitting: Thoracic Surgery (Cardiothoracic Vascular Surgery)

## 2019-01-30 ENCOUNTER — Emergency Department (HOSPITAL_COMMUNITY)
Admission: EM | Admit: 2019-01-30 | Discharge: 2019-01-30 | Disposition: A | Discharge status: Home / Self Care | Payer: Self-pay | Attending: Emergency Medicine | Admitting: Emergency Medicine

## 2019-01-30 ENCOUNTER — Emergency Department (HOSPITAL_COMMUNITY): Payer: Self-pay

## 2019-01-30 ENCOUNTER — Encounter (HOSPITAL_COMMUNITY): Payer: Self-pay | Admitting: *Deleted

## 2019-01-30 ENCOUNTER — Other Ambulatory Visit: Payer: Self-pay

## 2019-01-30 DIAGNOSIS — Z955 Presence of coronary angioplasty implant and graft: Secondary | ICD-10-CM | POA: Insufficient documentation

## 2019-01-30 DIAGNOSIS — F17210 Nicotine dependence, cigarettes, uncomplicated: Secondary | ICD-10-CM | POA: Insufficient documentation

## 2019-01-30 DIAGNOSIS — Z951 Presence of aortocoronary bypass graft: Secondary | ICD-10-CM | POA: Insufficient documentation

## 2019-01-30 DIAGNOSIS — I110 Hypertensive heart disease with heart failure: Secondary | ICD-10-CM | POA: Insufficient documentation

## 2019-01-30 DIAGNOSIS — M546 Pain in thoracic spine: Secondary | ICD-10-CM | POA: Insufficient documentation

## 2019-01-30 DIAGNOSIS — I259 Chronic ischemic heart disease, unspecified: Secondary | ICD-10-CM | POA: Insufficient documentation

## 2019-01-30 DIAGNOSIS — Z79899 Other long term (current) drug therapy: Secondary | ICD-10-CM | POA: Insufficient documentation

## 2019-01-30 DIAGNOSIS — I5032 Chronic diastolic (congestive) heart failure: Secondary | ICD-10-CM | POA: Insufficient documentation

## 2019-01-30 DIAGNOSIS — I252 Old myocardial infarction: Secondary | ICD-10-CM | POA: Insufficient documentation

## 2019-01-30 DIAGNOSIS — R1032 Left lower quadrant pain: Secondary | ICD-10-CM | POA: Insufficient documentation

## 2019-01-30 LAB — URINALYSIS, ROUTINE W REFLEX MICROSCOPIC
Bilirubin Urine: NEGATIVE
Glucose, UA: NEGATIVE mg/dL
Hgb urine dipstick: NEGATIVE
Ketones, ur: NEGATIVE mg/dL
Nitrite: NEGATIVE
Protein, ur: 30 mg/dL — AB
Specific Gravity, Urine: 1.027 (ref 1.005–1.030)
pH: 5 (ref 5.0–8.0)

## 2019-01-30 LAB — BASIC METABOLIC PANEL
Anion gap: 14 (ref 5–15)
BUN: 5 mg/dL — ABNORMAL LOW (ref 6–20)
CO2: 20 mmol/L — ABNORMAL LOW (ref 22–32)
Calcium: 9.3 mg/dL (ref 8.9–10.3)
Chloride: 107 mmol/L (ref 98–111)
Creatinine, Ser: 0.85 mg/dL (ref 0.44–1.00)
GFR calc Af Amer: >60 mL/min (ref >60)
GFR calc non Af Amer: >60 mL/min (ref >60)
Glucose, Bld: 112 mg/dL — ABNORMAL HIGH (ref 70–99)
Potassium: 4.1 mmol/L (ref 3.5–5.1)
Sodium: 141 mmol/L (ref 135–145)

## 2019-01-30 LAB — CBC
HCT: 31.4 % — ABNORMAL LOW (ref 36.0–46.0)
Hemoglobin: 9.5 g/dL — ABNORMAL LOW (ref 12.0–15.0)
MCH: 27.6 pg (ref 26.0–34.0)
MCHC: 30.3 g/dL (ref 30.0–36.0)
MCV: 91.3 fL (ref 80.0–100.0)
Platelets: 536 10*3/uL — ABNORMAL HIGH (ref 150–400)
RBC: 3.44 MIL/uL — ABNORMAL LOW (ref 3.87–5.11)
RDW: 13.6 % (ref 11.5–15.5)
WBC: 11.9 10*3/uL — ABNORMAL HIGH (ref 4.0–10.5)
nRBC: 0.3 % — ABNORMAL HIGH (ref 0.0–0.2)

## 2019-01-30 LAB — HEPATIC FUNCTION PANEL
ALT: 104 U/L — ABNORMAL HIGH (ref 0–44)
AST: 58 U/L — ABNORMAL HIGH (ref 15–41)
Albumin: 2.6 g/dL — ABNORMAL LOW (ref 3.5–5.0)
Alkaline Phosphatase: 100 U/L (ref 38–126)
Bilirubin, Direct: <0.1 mg/dL (ref 0.0–0.2)
Total Bilirubin: 0.4 mg/dL (ref 0.3–1.2)
Total Protein: 6.7 g/dL (ref 6.5–8.1)

## 2019-01-30 LAB — TROPONIN I (HIGH SENSITIVITY)
Troponin I (High Sensitivity): 25 ng/L — ABNORMAL HIGH (ref <18)
Troponin I (High Sensitivity): 26 ng/L — ABNORMAL HIGH (ref <18)

## 2019-01-30 LAB — LIPASE, BLOOD: Lipase: 152 U/L — ABNORMAL HIGH (ref 11–51)

## 2019-01-30 MED ORDER — SODIUM CHLORIDE 0.9% FLUSH: 3.0000 mL | Freq: Once | INTRAVENOUS | Status: DC

## 2019-01-30 MED ORDER — FENTANYL CITRATE (PF) 100 MCG/2ML IJ SOLN
75.0000 ug | INTRAMUSCULAR | Status: DC | PRN
  Administered 2019-01-30: 06:00:00 75 ug via INTRAVENOUS
  Filled 2019-01-30: qty 2

## 2019-01-30 MED ORDER — ALBUTEROL SULFATE HFA 108 (90 BASE) MCG/ACT IN AERS: 2.0000 | INHALATION_SPRAY | Freq: Four times a day (QID) | RESPIRATORY_TRACT | 1 refills | Status: DC | PRN

## 2019-01-30 MED ORDER — LIDOCAINE 5 % EX PTCH: 1.0000 | MEDICATED_PATCH | CUTANEOUS | 0 refills | Status: DC

## 2019-01-30 MED ORDER — IOHEXOL 300 MG/ML  SOLN
100.0000 mL | Freq: Once | INTRAMUSCULAR | Status: AC | PRN
  Administered 2019-01-30: 100 mL via INTRAVENOUS

## 2019-01-30 MED ORDER — FENTANYL CITRATE (PF) 100 MCG/2ML IJ SOLN
75.0000 ug | Freq: Once | INTRAMUSCULAR | Status: AC
  Administered 2019-01-30: 03:00:00 75 ug via INTRAVENOUS
  Filled 2019-01-30: qty 2

## 2019-01-30 NOTE — ED Triage Notes (Signed)
Pt arrives via [facility] with c/o right sided mid thoracic back pain, radiates across the stomach to the left side of her back for about 3 hours, worse with lying down. Hx of CABG Dec 10. 12 lead WNL. 114/78, hr 90, spo2 97%, 97.5, 324 ASA and 1 nitroglycerin. No n/v. Started having some drainage from incision today.

## 2019-01-30 NOTE — ED Notes (Signed)
Patient aware that we need urine sample for testing, unable at this time. Pt given instruction on providing urine sample when able to do so.   

## 2019-01-30 NOTE — Discharge Instructions (Addendum)
Thank you for allowing me to care for you today in the Emergency Department.   Your work-up today was reassuring.  Follow-up with cardiac rehab.  Follow closely with exercise guidelines from cardiac rehab.  Make sure not to overdo it as you just recently had a laser surgery.  I have given you a refill of your home albuterol inhaler.  You can take Tylenol for pain.  I have also given you a prescription for lidocaine patches.  Your urine has been sent for culture.  You will receive a call from the hospital if it is positive.  If your symptoms persist, follow-up with primary care or with cardiology as needed.  Return to the emergency department if you develop uncontrollable vomiting, respiratory distress, if you pass out, if your fingers or lips turn blue, or other new concerning symptoms.

## 2019-01-30 NOTE — ED Notes (Signed)
Pt discharge instructions and prescriptions reviewed with the patient. The patient verbalized understanding of both. Pt discharged. 
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
## 2019-01-31 ENCOUNTER — Ambulatory Visit (INDEPENDENT_AMBULATORY_CARE_PROVIDER_SITE_OTHER): Payer: Self-pay | Admitting: Thoracic Surgery (Cardiothoracic Vascular Surgery)

## 2019-01-31 ENCOUNTER — Encounter: Payer: Self-pay | Admitting: Thoracic Surgery (Cardiothoracic Vascular Surgery)

## 2019-01-31 ENCOUNTER — Telehealth (HOSPITAL_COMMUNITY): Payer: Self-pay

## 2019-01-31 VITALS — BP 149/87 | HR 85 | Temp 97.8°F | Resp 20 | Ht 61.0 in | Wt 181.0 lb

## 2019-01-31 DIAGNOSIS — Z951 Presence of aortocoronary bypass graft: Secondary | ICD-10-CM

## 2019-01-31 LAB — URINE CULTURE: Culture: <10000 — AB

## 2019-01-31 MED ORDER — ALBUTEROL SULFATE HFA 108 (90 BASE) MCG/ACT IN AERS: 2.0000 | INHALATION_SPRAY | Freq: Four times a day (QID) | RESPIRATORY_TRACT | 1 refills | Status: DC | PRN

## 2019-01-31 MED ORDER — TRAMADOL HCL 50 MG PO TABS: 50.0000 mg | ORAL_TABLET | Freq: Four times a day (QID) | ORAL | 0 refills | Status: DC | PRN

## 2019-01-31 NOTE — Telephone Encounter (Signed)
Attempted to contact pt in regards to CR, pt voicemail not set up. Unable to leave VM.

## 2019-01-31 NOTE — Progress Notes (Signed)
      [address]       [place] [address]             [phone]        Enrique [place] Medical Record #[id number] Date of Birth: 1966-01-03  Referring: Belva Crome, MD Primary Care: Synthia Innocent Audrea Muscat, NP Primary Cardiologist:Henry Carlye Grippe, MD  Reason for visit:   follow-up  History of Present Illness:     Ms. Eppolito presents for her 1st appointment.  Overall she is doing well.  She continues to have some pain issues, and went to the ED yesterday with chest wall pain complaint.  She has not been using her pain medication.  Physical Exam: BP (!) 149/87   Pulse 85   Temp 97.8 F (36.6 C) (Skin)   Resp 20   Ht 5\' 1"  (1.549 m)   Wt 181 lb (82.1 kg)   LMP  (LMP Unknown)   SpO2 96% Comment: RA  BMI 34.20 kg/m   Alert NAD Incision clean.  Sternum stable Abdomen soft, ND no peripheral edema   Diagnostic Studies & Laboratory data: CXR: clear    Assessment / Plan:   S/p CABG.  Doing well Rx for albuterol and tramadol refilled RTC in 1 month w/ CXR   Lajuana Matte 01/31/2019 4:19 PM

## 2019-02-04 ENCOUNTER — Encounter (HOSPITAL_COMMUNITY): Payer: Self-pay | Admitting: Emergency Medicine

## 2019-02-04 ENCOUNTER — Other Ambulatory Visit: Payer: Self-pay

## 2019-02-04 ENCOUNTER — Emergency Department (HOSPITAL_COMMUNITY)
Admission: EM | Admit: 2019-02-04 | Discharge: 2019-02-04 | Disposition: A | Discharge status: Home / Self Care | Payer: Self-pay | Attending: Emergency Medicine | Admitting: Emergency Medicine

## 2019-02-04 DIAGNOSIS — I2510 Atherosclerotic heart disease of native coronary artery without angina pectoris: Secondary | ICD-10-CM | POA: Insufficient documentation

## 2019-02-04 DIAGNOSIS — I252 Old myocardial infarction: Secondary | ICD-10-CM | POA: Insufficient documentation

## 2019-02-04 DIAGNOSIS — Z91040 Latex allergy status: Secondary | ICD-10-CM | POA: Insufficient documentation

## 2019-02-04 DIAGNOSIS — I10 Essential (primary) hypertension: Secondary | ICD-10-CM | POA: Insufficient documentation

## 2019-02-04 DIAGNOSIS — Z7982 Long term (current) use of aspirin: Secondary | ICD-10-CM | POA: Insufficient documentation

## 2019-02-04 DIAGNOSIS — Z79899 Other long term (current) drug therapy: Secondary | ICD-10-CM | POA: Insufficient documentation

## 2019-02-04 DIAGNOSIS — F1110 Opioid abuse, uncomplicated: Secondary | ICD-10-CM | POA: Insufficient documentation

## 2019-02-04 DIAGNOSIS — Z951 Presence of aortocoronary bypass graft: Secondary | ICD-10-CM | POA: Insufficient documentation

## 2019-02-04 DIAGNOSIS — F17210 Nicotine dependence, cigarettes, uncomplicated: Secondary | ICD-10-CM | POA: Insufficient documentation

## 2019-02-04 DIAGNOSIS — F1310 Sedative, hypnotic or anxiolytic abuse, uncomplicated: Secondary | ICD-10-CM | POA: Insufficient documentation

## 2019-02-04 DIAGNOSIS — F419 Anxiety disorder, unspecified: Secondary | ICD-10-CM | POA: Insufficient documentation

## 2019-02-04 LAB — TROPONIN I (HIGH SENSITIVITY): Troponin I (High Sensitivity): 11 ng/L

## 2019-02-04 LAB — BASIC METABOLIC PANEL WITH GFR
Anion gap: 11 (ref 5–15)
BUN: 6 mg/dL (ref 6–20)
CO2: 24 mmol/L (ref 22–32)
Calcium: 9.4 mg/dL (ref 8.9–10.3)
Chloride: 104 mmol/L (ref 98–111)
Creatinine, Ser: 0.82 mg/dL (ref 0.44–1.00)
GFR calc Af Amer: >60 mL/min
GFR calc non Af Amer: >60 mL/min
Glucose, Bld: 105 mg/dL — ABNORMAL HIGH (ref 70–99)
Potassium: 3.8 mmol/L (ref 3.5–5.1)
Sodium: 139 mmol/L (ref 135–145)

## 2019-02-04 LAB — CBC
HCT: 37.3 % (ref 36.0–46.0)
Hemoglobin: 11.3 g/dL — ABNORMAL LOW (ref 12.0–15.0)
MCH: 27 pg (ref 26.0–34.0)
MCHC: 30.3 g/dL (ref 30.0–36.0)
MCV: 89.2 fL (ref 80.0–100.0)
Platelets: 710 K/uL — ABNORMAL HIGH (ref 150–400)
RBC: 4.18 MIL/uL (ref 3.87–5.11)
RDW: 13.1 % (ref 11.5–15.5)
WBC: 8 K/uL (ref 4.0–10.5)
nRBC: 0 % (ref 0.0–0.2)

## 2019-02-04 MED ORDER — SODIUM CHLORIDE 0.9% FLUSH: 3.0000 mL | Freq: Once | INTRAVENOUS | Status: DC

## 2019-02-04 NOTE — ED Triage Notes (Signed)
Pt arrived to triage very anxious and c/o palpitations.  States she dropped her medication and got it confused and doesn't know when she is supposed to take it again.  Pt has bottle of Metoprolol that doesn't have a lid on it.  Pt given specimen bag to put medication in.  Informed pt that it states on bottle to take twice per day.  She states she took 1 today around 5:30-6am and she needed to know what time to take the next one.  Explained that medication is twice per day so is generally every 12 hours unless different instructions per her doctor.  Pt states ok I am leaving then because I got what I needed.  Encouraged pt to stay to be seen since she is reporting palpitations and is extremely anxious.  CABG on 12/10.

## 2019-02-04 NOTE — Discharge Instructions (Addendum)
Follow up with your heart doctor as scheduled.  Return to the ER with any new, worsening, or concerning symptoms.
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
## 2019-02-05 ENCOUNTER — Encounter: Payer: Self-pay | Admitting: *Deleted
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
## 2019-02-14 ENCOUNTER — Ambulatory Visit (INDEPENDENT_AMBULATORY_CARE_PROVIDER_SITE_OTHER): Payer: Self-pay | Admitting: Physician Assistant

## 2019-02-14 ENCOUNTER — Encounter: Payer: Self-pay | Admitting: Physician Assistant

## 2019-02-14 ENCOUNTER — Other Ambulatory Visit: Payer: Self-pay

## 2019-02-14 VITALS — BP 140/78 | HR 82 | Ht 61.0 in | Wt 177.4 lb

## 2019-02-14 DIAGNOSIS — R7989 Other specified abnormal findings of blood chemistry: Secondary | ICD-10-CM

## 2019-02-14 DIAGNOSIS — M7989 Other specified soft tissue disorders: Secondary | ICD-10-CM

## 2019-02-14 DIAGNOSIS — I214 Non-ST elevation (NSTEMI) myocardial infarction: Secondary | ICD-10-CM

## 2019-02-14 DIAGNOSIS — I10 Essential (primary) hypertension: Secondary | ICD-10-CM

## 2019-02-14 DIAGNOSIS — E782 Mixed hyperlipidemia: Secondary | ICD-10-CM

## 2019-02-14 MED ORDER — METOPROLOL TARTRATE 25 MG PO TABS: 25.0000 mg | ORAL_TABLET | Freq: Two times a day (BID) | ORAL | 3 refills | Status: DC

## 2019-02-14 MED ORDER — LOSARTAN POTASSIUM 25 MG PO TABS: 25.0000 mg | ORAL_TABLET | Freq: Every day | ORAL | 3 refills | Status: DC

## 2019-02-14 MED ORDER — FUROSEMIDE 20 MG PO TABS: 20.0000 mg | ORAL_TABLET | ORAL | 6 refills | Status: DC | PRN

## 2019-02-14 MED ORDER — ROSUVASTATIN CALCIUM 40 MG PO TABS: 40.0000 mg | ORAL_TABLET | Freq: Every day | ORAL | 3 refills | Status: DC

## 2019-02-14 NOTE — Patient Instructions (Addendum)
Medication Instructions:   Your physician has recommended you make the following change in your medication:   1) Start Losartan 25MG , 1 tablet by mouth once a day 2) Start Furosemide 20MG , 1 tablet by mouth as needed for swelling  *If you need a refill on your cardiac medications before your next appointment, please call your pharmacy*  Lab Work:  Your physician recommends that you return for lab work on 02/28/19 at 9:00AM  If you have labs (blood work) drawn today and your tests are completely normal, you will receive your results only by: MyChart Message (if you have MyChart) OR . A paper copy in the mail If you have any lab test that is abnormal or we need to change your treatment, we will call you to review the results.  Testing/Procedures:  None ordered today  Follow-Up: At [facility], you and your health needs are our priority.  As part of our continuing mission to provide you with exceptional heart care, we have created designated Provider Care Teams.  These Care Teams include your primary Cardiologist (physician) and Advanced Practice Providers (APPs -  Physician Assistants and Nurse Practitioners) who all work together to provide you with the care you need, when you need it.  Your next appointment:  In 6-8 weeks with Dr. Marland Kitchen, I will message his nurse for an appointment and I will call you back to set the appointment up.   Other Instructions  If you find yourself taking your Furosemide daily let [facility] know. I have started a referral to Cardiac Rehab, they will be calling you to set up an appointment.     Heart-Healthy Eating Plan Heart-healthy meal planning includes:  Eating less unhealthy fats.  Eating more healthy fats.  Making other changes in your diet. Talk with your doctor or a diet specialist (dietitian) to create an eating plan that is right for you. What is my plan? Your doctor may recommend an eating plan that includes:  Total fat: ______% or  less of total calories a day.  Saturated fat: ______% or less of total calories a day.  Cholesterol: less than _________mg a day. What are tips for following this plan? Cooking Avoid frying your food. Try to bake, boil, grill, or broil it instead. You can also reduce fat by:  Removing the skin from poultry.  Removing all visible fats from meats.  Steaming vegetables in water or broth. Meal planning   At meals, divide your plate into four equal parts: ? Fill one-half of your plate with vegetables and green salads. ? Fill one-fourth of your plate with whole grains. ? Fill one-fourth of your plate with lean protein foods.  Eat 4-5 servings of vegetables per day. A serving of vegetables is: ? 1 cup of raw or cooked vegetables. ? 2 cups of raw leafy greens.  Eat 4-5 servings of fruit per day. A serving of fruit is: ? 1 medium whole fruit. ?  cup of dried fruit. ?  cup of fresh, frozen, or canned fruit. ?  cup of 100% fruit juice.  Eat more foods that have soluble fiber. These are apples, broccoli, carrots, beans, peas, and barley. Try to get 20-30 g of fiber per day.  Eat 4-5 servings of nuts, legumes, and seeds per week: ? 1 serving of dried beans or legumes equals  cup after being cooked. ? 1 serving of nuts is  cup. ? 1 serving of seeds equals 1 tablespoon. General information  Eat more home-cooked  food. Eat less restaurant, buffet, and fast food.  Limit or avoid alcohol.  Limit foods that are high in starch and sugar.  Avoid fried foods.  Lose weight if you are overweight.  Keep track of how much salt (sodium) you eat. This is important if you have high blood pressure. Ask your doctor to tell you more about this.  Try to add vegetarian meals each week. Fats  Choose healthy fats. These include olive oil and canola oil, flaxseeds, walnuts, almonds, and seeds.  Eat more omega-3 fats. These include salmon, mackerel, sardines, tuna, flaxseed oil, and ground  flaxseeds. Try to eat fish at least 2 times each week.  Check food labels. Avoid foods with trans fats or high amounts of saturated fat.  Limit saturated fats. ? These are often found in animal products, such as meats, butter, and cream. ? These are also found in plant foods, such as palm oil, palm kernel oil, and coconut oil.  Avoid foods with partially hydrogenated oils in them. These have trans fats. Examples are stick margarine, some tub margarines, cookies, crackers, and other baked goods. What foods can I eat? Fruits All fresh, canned (in natural juice), or frozen fruits. Vegetables Fresh or frozen vegetables (raw, steamed, roasted, or grilled). Green salads. Grains Most grains. Choose whole wheat and whole grains most of the time. Rice and pasta, including brown rice and pastas made with whole wheat. Meats and other proteins Lean, well-trimmed beef, veal, pork, and lamb. Chicken and [place] without skin. All fish and shellfish. Wild duck, rabbit, pheasant, and venison. Egg whites or low-cholesterol egg substitutes. Dried beans, peas, lentils, and tofu. Seeds and most nuts. Dairy Low-fat or nonfat cheeses, including ricotta and mozzarella. Skim or 1% milk that is liquid, powdered, or evaporated. Buttermilk that is made with low-fat milk. Nonfat or low-fat yogurt. Fats and oils Non-hydrogenated (trans-free) margarines. Vegetable oils, including soybean, sesame, sunflower, olive, peanut, safflower, corn, canola, and cottonseed. Salad dressings or mayonnaise made with a vegetable oil. Beverages Mineral water. Coffee and tea. Diet carbonated beverages. Sweets and desserts Sherbet, gelatin, and fruit ice. Small amounts of dark chocolate. Limit all sweets and desserts. Seasonings and condiments All seasonings and condiments. The items listed above may not be a complete list of foods and drinks you can eat. Contact a dietitian for more options. What foods should I avoid? Fruits Canned  fruit in heavy syrup. Fruit in cream or butter sauce. Fried fruit. Limit coconut. Vegetables Vegetables cooked in cheese, cream, or butter sauce. Fried vegetables. Grains Breads that are made with saturated or trans fats, oils, or whole milk. Croissants. Sweet rolls. Donuts. High-fat crackers, such as cheese crackers. Meats and other proteins Fatty meats, such as hot dogs, ribs, sausage, bacon, rib-eye roast or steak. High-fat deli meats, such as salami and bologna. Caviar. Domestic duck and goose. Organ meats, such as liver. Dairy Cream, sour cream, cream cheese, and creamed cottage cheese. Whole-milk cheeses. Whole or 2% milk that is liquid, evaporated, or condensed. Whole buttermilk. Cream sauce or high-fat cheese sauce. Yogurt that is made from whole milk. Fats and oils Meat fat, or shortening. Cocoa butter, hydrogenated oils, palm oil, coconut oil, palm kernel oil. Solid fats and shortenings, including bacon fat, salt pork, lard, and butter. Nondairy cream substitutes. Salad dressings with cheese or sour cream. Beverages Regular sodas and juice drinks with added sugar. Sweets and desserts Frosting. Pudding. Cookies. Cakes. Pies. Milk chocolate or white chocolate. Buttered syrups. Full-fat ice cream or ice cream drinks.  The items listed above may not be a complete list of foods and drinks to avoid. Contact a dietitian for more information. Summary  Heart-healthy meal planning includes eating less unhealthy fats, eating more healthy fats, and making other changes in your diet.  Eat a balanced diet. This includes fruits and vegetables, low-fat or nonfat dairy, lean protein, nuts and legumes, whole grains, and heart-healthy oils and fats. This information is not intended to replace advice given to you by your health care provider. Make sure you discuss any questions you have with your health care provider. Document Revised: 03/31/2017 Document Reviewed: 03/04/2017 Elsevier Patient Education   2020 [employer].

## 2019-02-15 ENCOUNTER — Telehealth: Payer: Self-pay

## 2019-02-15 NOTE — Telephone Encounter (Signed)
I tried to call patient about a question she had yesterday at her appointment with Lorin Picket. And to also schedule a follow up with Dr. Katrinka Blazing. There was no answer and patients voicemail is not set up.

## 2019-02-15 NOTE — Telephone Encounter (Signed)
-----   Message from Julio Sicks, RN sent at 02/15/2019  7:50 AM EST ----- Regarding: RE: follow up 2/24 at 10A or 3/3 at 9:20A.  Thanks! ----- Message ----- From: Lajoyce Corners, CMA Sent: 02/14/2019  10:37 AM EST To: Julio Sicks, RN Subject: follow up                                      Patient saw Tereso Newcomer today, she needs a 6-8 week follow up with Dr. Katrinka Blazing with fasting lipids. If you want to let me know what morning I can work her in, I will call her. Thank you

## 2019-02-16 ENCOUNTER — Telehealth: Payer: Self-pay | Admitting: Physician Assistant

## 2019-02-16 NOTE — Telephone Encounter (Signed)
I doubt the jaw pain is related to Losartan.  This is not a typical side effect.  She may want to see primary care (? Dental issue). She can hold the Losartan for a few days then resume it. If her symptoms resolve and return when she resumes the Losartan, she should call. Chest pain is probably from incision.  If anything worsens, call or go to ED.   Tereso Newcomer, PA-C    02/16/2019 1:17 PM

## 2019-02-16 NOTE — Telephone Encounter (Signed)
Pt started on Losartan 25mg  QD on 1/6.  She picked it up that day and went ahead and started it.  States over the last three days she has noticed some pain in her lower jaw bilaterally and has some chest discomfort.  This occurs when she first gets up and lasts about 2 hours.  States symptoms are not similar to when she had her MI.  Denies SOB, dizziness, lightheadedness, blurred vision or other issues.  Left foot swollen this morning but is the same as it was when she seen Scott.  She took Furosemide yesterday and swelling resolved but was back this morning.  Advised her to go ahead and take another Furosemide.  No vitals available as pt does not have a cuff.  Advised pt I will get information over to , PA-C to review.  Pt appreciative for call.

## 2019-02-16 NOTE — Telephone Encounter (Signed)
Spoke with pt and made her aware of recommendations.  Pt verbalized understanding and was in agreement with plan.  

## 2019-02-16 NOTE — Telephone Encounter (Signed)
I called and spoke with patient, she is aware that her follow up with Dr. Katrinka Blazing is 04/04/19 at 10AM and that she can have decaffeinated coffee.

## 2019-02-16 NOTE — Telephone Encounter (Signed)
New message:    Patient calling concerning some medication issue. Patient states every since she stared Losartan 25 mg. Patient has some pain in her lower jaw and her foot is swollen. Please call patient. Patient states she needs to come.

## 2019-02-19 ENCOUNTER — Telehealth: Payer: Self-pay | Admitting: Physician Assistant

## 2019-02-19 NOTE — Telephone Encounter (Signed)
Try resuming Losartan at 12.5 mg once daily (1/2 tab daily). If symptoms return, stop and call us for alternate drug. Tereso Newcomer, PA-C    02/19/2019 2:36 PM

## 2019-02-19 NOTE — Telephone Encounter (Signed)
Patient is calling stating when she last spoke with Alben Spittle they took her off of  Pt c/o medication issue:  1. Name of Medication: losartan (COZAAR) 25 MG tablet  2. How are you currently taking this medication (dosage and times per day)? Not currently taking medications   3. Are you having a reaction (difficulty breathing--STAT)? No   4. What is your medication issue? Patient is calling stating after stopping the medication over the weekend as advised by Tereso Newcomer the problems that were previously occurring have stopped. She would like a callback in regards to what new medication she will be prescribed due to no longer taking losartan (COZAAR) 25 MG tablet.

## 2019-02-20 NOTE — Telephone Encounter (Signed)
I tried to call patient, there was no answer and no voicemail has been set up yet.

## 2019-02-20 NOTE — Telephone Encounter (Signed)
I called and spoke with patient, she is aware per Lorin Picket to resume Losartan 12.5MG  once a day (0.5 tablet). If her symptoms return, she is aware to call us and let us know.

## 2019-02-23 ENCOUNTER — Telehealth (HOSPITAL_COMMUNITY): Payer: Self-pay | Admitting: Pharmacist

## 2019-02-23 ENCOUNTER — Telehealth (HOSPITAL_COMMUNITY): Payer: Self-pay

## 2019-02-23 NOTE — Telephone Encounter (Signed)
Called to advise pt that given the surge/increase in Covid-19 cases and hospitalizations our [facility] Senior leadership has asked [place] to halt onsite Cardiac and Pulmonary rehab exercise sessions and move patients to the Virtual app we have with [facility]. The reason for this is so that department staff can be deployed to the Inpatient Nursing floors to assist those teams in need.  Leadership anticipates this need will be 30-60 days however it could be extended if needed. Patient expressed interest, pt will come in for orientation 02/27/19 @ 11AM.  Pt verbalized understanding and is in agreement.

## 2019-02-25 NOTE — Telephone Encounter (Signed)
Cardiac Rehab - Pharmacy Resident Documentation   Patient unable to be reached after three call attempts. Please complete allergy verification and medication review during patient's cardiac rehab appointment.     

## 2019-02-26 ENCOUNTER — Telehealth (HOSPITAL_COMMUNITY): Payer: Self-pay

## 2019-02-26 NOTE — Telephone Encounter (Signed)
Cardiac Rehab Note:  Unsuccessful telephone encounter to [name] to confirm her Cardiac Rehab Orientation appointment 02/27/19 at 11:00. Unfortunately "voice mailbox has not been set up".  Plan: Will call back later today  Alcee Sipos E. Vaughan Basta, BSN

## 2019-02-26 NOTE — Telephone Encounter (Signed)
Cardiac Rehab Note:  Second unsuccessful telephone encounter to [name] to confirm her Cardiac Rehab Orientation appointment 02/27/19 at 11:00. Unfortunately "voice mailbox has not been set up".  Plan: Will attempt to contact in the morning prior to orientation appointmen  Serenity Fortner E. Vaughan Basta, BSN

## 2019-02-27 ENCOUNTER — Ambulatory Visit (HOSPITAL_COMMUNITY): Payer: Self-pay

## 2019-02-27 ENCOUNTER — Telehealth (HOSPITAL_COMMUNITY): Payer: Self-pay | Admitting: *Deleted

## 2019-02-28 ENCOUNTER — Other Ambulatory Visit: Payer: Self-pay | Admitting: Thoracic Surgery (Cardiothoracic Vascular Surgery)

## 2019-02-28 ENCOUNTER — Telehealth: Payer: Self-pay

## 2019-02-28 ENCOUNTER — Other Ambulatory Visit: Payer: Self-pay

## 2019-02-28 ENCOUNTER — Other Ambulatory Visit: Payer: Self-pay | Admitting: *Deleted

## 2019-02-28 DIAGNOSIS — I214 Non-ST elevation (NSTEMI) myocardial infarction: Secondary | ICD-10-CM

## 2019-02-28 DIAGNOSIS — M7989 Other specified soft tissue disorders: Secondary | ICD-10-CM

## 2019-02-28 DIAGNOSIS — E782 Mixed hyperlipidemia: Secondary | ICD-10-CM

## 2019-02-28 DIAGNOSIS — I10 Essential (primary) hypertension: Secondary | ICD-10-CM

## 2019-02-28 DIAGNOSIS — R7989 Other specified abnormal findings of blood chemistry: Secondary | ICD-10-CM

## 2019-02-28 DIAGNOSIS — I25119 Atherosclerotic heart disease of native coronary artery with unspecified angina pectoris: Secondary | ICD-10-CM

## 2019-02-28 NOTE — Telephone Encounter (Signed)
I tried calling the patient.  She does not have a VM set up. 1/20

## 2019-02-28 NOTE — Telephone Encounter (Signed)
I tried to call the patient again, but she does not have a VM set up.  She walked in earlier today 1/20 wanting to inform Lorin Picket that her BP has been high (160/78).  Recently, 1/12, her Losartan was reduced to 12.5 mg Daily, as she was thinking this caused jaw and CP.  I tried to reach her to gather more information, but no VM.

## 2019-03-01 ENCOUNTER — Telehealth: Payer: Self-pay

## 2019-03-01 LAB — COMPREHENSIVE METABOLIC PANEL
ALT: 12 IU/L (ref 0–32)
AST: 21 IU/L (ref 0–40)
Albumin/Globulin Ratio: 1.6 (ref 1.2–2.2)
Albumin: 4 g/dL (ref 3.8–4.9)
Alkaline Phosphatase: 81 IU/L (ref 39–117)
BUN/Creatinine Ratio: 4 — ABNORMAL LOW (ref 9–23)
BUN: 4 mg/dL — ABNORMAL LOW (ref 6–24)
Bilirubin Total: 0.4 mg/dL (ref 0.0–1.2)
CO2: 25 mmol/L (ref 20–29)
Calcium: 9.7 mg/dL (ref 8.7–10.2)
Chloride: 103 mmol/L (ref 96–106)
Creatinine, Ser: 0.96 mg/dL (ref 0.57–1.00)
GFR calc Af Amer: 79 mL/min/{1.73_m2} (ref >59)
GFR calc non Af Amer: 68 mL/min/{1.73_m2} (ref >59)
Globulin, Total: 2.5 g/dL (ref 1.5–4.5)
Glucose: 90 mg/dL (ref 65–99)
Potassium: 3.5 mmol/L (ref 3.5–5.2)
Sodium: 141 mmol/L (ref 134–144)
Total Protein: 6.5 g/dL (ref 6.0–8.5)

## 2019-03-01 MED ORDER — POTASSIUM CHLORIDE CRYS ER 20 MEQ PO TBCR: 20.0000 meq | EXTENDED_RELEASE_TABLET | Freq: Every day | ORAL | 6 refills | Status: DC | PRN

## 2019-03-01 NOTE — Telephone Encounter (Signed)
Patient aware of lab results and to take Potassium as needed when she takes her Lasix for swelling.

## 2019-03-02 ENCOUNTER — Ambulatory Visit
Admission: RE | Admit: 2019-03-02 | Discharge: 2019-03-02 | Disposition: A | Discharge status: Home / Self Care | Payer: Self-pay | Source: Ambulatory Visit | Attending: Thoracic Surgery (Cardiothoracic Vascular Surgery) | Admitting: Thoracic Surgery (Cardiothoracic Vascular Surgery)

## 2019-03-02 ENCOUNTER — Ambulatory Visit (INDEPENDENT_AMBULATORY_CARE_PROVIDER_SITE_OTHER): Payer: Self-pay | Admitting: Thoracic Surgery (Cardiothoracic Vascular Surgery)

## 2019-03-02 ENCOUNTER — Encounter: Payer: Self-pay | Admitting: Thoracic Surgery (Cardiothoracic Vascular Surgery)

## 2019-03-02 ENCOUNTER — Other Ambulatory Visit: Payer: Self-pay

## 2019-03-02 VITALS — BP 148/93 | HR 61 | Temp 97.5°F | Resp 16 | Ht 61.0 in | Wt 178.0 lb

## 2019-03-02 DIAGNOSIS — I25119 Atherosclerotic heart disease of native coronary artery with unspecified angina pectoris: Secondary | ICD-10-CM

## 2019-03-02 DIAGNOSIS — J90 Pleural effusion, not elsewhere classified: Secondary | ICD-10-CM

## 2019-03-02 DIAGNOSIS — Z951 Presence of aortocoronary bypass graft: Secondary | ICD-10-CM

## 2019-03-02 NOTE — Progress Notes (Signed)
      [address]       [place] [address]             [phone]        [facility] Medical Record #[id number] Date of Birth: 02/27/1965  Referring: Lyn Records, MD Primary Care: Hilbert Corrigan Chales Abrahams, NP Primary Cardiologist:Henry Jamey Reas, MD  Reason for visit:   follow-up  History of Present Illness:     Mr. Noori presents today for her 2nd follow-up appointment.  She complains of some shortness of breath and incision pain  Physical Exam: BP (!) 148/93 (BP Location: Left Arm, Patient Position: Sitting, Cuff Size: Large)   Pulse 61   Temp (!) 97.5 F (36.4 C)   Resp 16   Ht 5\' 1"  (1.549 m)   Wt 178 lb (80.7 kg)   SpO2 95% Comment: RA  BMI 33.63 kg/m   Alert NAD Incision clean, well healed.  Sternum stable Decreased on the L Abdomen soft, ND no peripheral edema   Diagnostic Studies & Laboratory data: CXR: L effusion     Assessment / Plan:   54 yo female s/p CABG, now with worsening L effusion Thoracentesis ordered Will follow-up in 1 month w cxr   44 03/02/2019 3:06 PM
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
## 2019-03-06 ENCOUNTER — Ambulatory Visit: Payer: Self-pay | Admitting: Physician Assistant

## 2019-03-07 ENCOUNTER — Telehealth (HOSPITAL_COMMUNITY): Payer: Self-pay

## 2019-03-07 NOTE — Telephone Encounter (Signed)
Cardiac Rehab Note:  Telephone call to TCTS provider office to discuss patients need for thoracentesis to facilitate care coordination. Per telephone conversation with Ms. Masur 03/06/19, she is under the impression that she needed to be covid tested prior to scheduling procedure. During conversation with patient, she admits to taking it upon herself to be tested however she is not self quarantining. This RN spoke with Sandi Raveling RN (TCTS) who confirmed patients misunderstanding of process for outpatient procedures as it relates to need for covid test. TCTS RN will call patient to discuss further.  Ms. Gundrum is scheduled for Cardiac Rehab orientation 03/08/19 however it is decided that patients orientation will be cancelled until she can receive thoracentesis. Patient verbalizes understanding.  Plan: CR orientation appointment cancelled. Will follow via EMR for medical stability and readiness for CR.  Saran Laviolette E. Suzie Portela RN, BSN [place]. [facility] Cardiac and Pulmonary Rehab [phone] Dial Direct: [phone]

## 2019-03-08 ENCOUNTER — Ambulatory Visit (HOSPITAL_COMMUNITY)
Admission: RE | Admit: 2019-03-08 | Discharge: 2019-03-08 | Disposition: A | Discharge status: Home / Self Care | Payer: Self-pay | Source: Ambulatory Visit | Attending: Thoracic Surgery (Cardiothoracic Vascular Surgery) | Admitting: Thoracic Surgery (Cardiothoracic Vascular Surgery)

## 2019-03-08 ENCOUNTER — Encounter (HOSPITAL_COMMUNITY): Payer: Self-pay

## 2019-03-08 ENCOUNTER — Other Ambulatory Visit: Payer: Self-pay

## 2019-03-08 ENCOUNTER — Ambulatory Visit (HOSPITAL_COMMUNITY)
Admission: RE | Admit: 2019-03-08 | Discharge: 2019-03-08 | Disposition: A | Discharge status: Home / Self Care | Payer: Self-pay | Source: Ambulatory Visit | Attending: Student | Admitting: Student

## 2019-03-08 DIAGNOSIS — Z9889 Other specified postprocedural states: Secondary | ICD-10-CM

## 2019-03-08 DIAGNOSIS — J90 Pleural effusion, not elsewhere classified: Secondary | ICD-10-CM | POA: Insufficient documentation

## 2019-03-08 MED ORDER — LIDOCAINE HCL 1 % IJ SOLN
INTRAMUSCULAR | Status: AC
  Filled 2019-03-08: qty 20

## 2019-03-08 NOTE — Procedures (Signed)
PROCEDURE SUMMARY:  Successful image-guided left thoracentesis. Yielded 975 milliliters of hazy gold fluid. Patient tolerated procedure well. No immediate complications. EBL < 5 mL.  Specimen was not sent for labs. CXR ordered.  Please see imaging section of Epic for full dictation.   Gordy Councilman Rakwon Letourneau PA-C 03/08/2019 11:55 AM

## 2019-03-09 ENCOUNTER — Telehealth: Payer: Self-pay

## 2019-03-09 NOTE — Telephone Encounter (Signed)
Pt called office to report that the area where she had a L thoracentesis yesterday is "very sore." She denies any sharp pain or shortness of breath. States she is now pain-free after taking Tramadol. Informed pt that it is normal for the area to be sore for several days and advised her to continue taking pain meds as directed by MD. Instructed to notify the office of any s/s of infection and to seek urgent medical attention for sharp chest pain and/or shortness of breath.

## 2019-03-12 ENCOUNTER — Telehealth: Payer: Self-pay

## 2019-03-12 NOTE — Telephone Encounter (Signed)
Pt calls office with c/o occasional "shooting pain" to L lower back. She states the post-thoracentesis pain she called about on 1/29 has improved and she denies shortness of breath. Advised her to call her PCP regarding the lower back pain. Instructed her to notify TCTS of any s/s of infection and to seek urgent medical care for sharp chest pain and/or acute shortness of breath.

## 2019-03-15 ENCOUNTER — Telehealth (HOSPITAL_COMMUNITY): Payer: Self-pay | Admitting: *Deleted

## 2019-03-21 ENCOUNTER — Telehealth: Payer: Self-pay | Admitting: Interventional Cardiology

## 2019-03-21 ENCOUNTER — Telehealth: Payer: Self-pay

## 2019-03-21 ENCOUNTER — Emergency Department (HOSPITAL_COMMUNITY): Payer: Self-pay

## 2019-03-21 ENCOUNTER — Telehealth (HOSPITAL_COMMUNITY): Payer: Self-pay | Admitting: *Deleted

## 2019-03-21 ENCOUNTER — Emergency Department (HOSPITAL_COMMUNITY)
Admission: EM | Admit: 2019-03-21 | Discharge: 2019-03-21 | Disposition: A | Discharge status: Home / Self Care | Payer: Self-pay | Attending: Emergency Medicine | Admitting: Emergency Medicine

## 2019-03-21 ENCOUNTER — Encounter (HOSPITAL_COMMUNITY): Payer: Self-pay | Admitting: Emergency Medicine

## 2019-03-21 DIAGNOSIS — I110 Hypertensive heart disease with heart failure: Secondary | ICD-10-CM | POA: Insufficient documentation

## 2019-03-21 DIAGNOSIS — I10 Essential (primary) hypertension: Secondary | ICD-10-CM | POA: Insufficient documentation

## 2019-03-21 DIAGNOSIS — Z91040 Latex allergy status: Secondary | ICD-10-CM | POA: Insufficient documentation

## 2019-03-21 DIAGNOSIS — Z87891 Personal history of nicotine dependence: Secondary | ICD-10-CM | POA: Insufficient documentation

## 2019-03-21 DIAGNOSIS — Z79899 Other long term (current) drug therapy: Secondary | ICD-10-CM | POA: Insufficient documentation

## 2019-03-21 DIAGNOSIS — Z951 Presence of aortocoronary bypass graft: Secondary | ICD-10-CM | POA: Insufficient documentation

## 2019-03-21 DIAGNOSIS — I5032 Chronic diastolic (congestive) heart failure: Secondary | ICD-10-CM | POA: Insufficient documentation

## 2019-03-21 DIAGNOSIS — I252 Old myocardial infarction: Secondary | ICD-10-CM | POA: Insufficient documentation

## 2019-03-21 DIAGNOSIS — I2510 Atherosclerotic heart disease of native coronary artery without angina pectoris: Secondary | ICD-10-CM | POA: Insufficient documentation

## 2019-03-21 DIAGNOSIS — Z7982 Long term (current) use of aspirin: Secondary | ICD-10-CM | POA: Insufficient documentation

## 2019-03-21 LAB — CBC
HCT: 43.9 % (ref 36.0–46.0)
Hemoglobin: 13.2 g/dL (ref 12.0–15.0)
MCH: 25.7 pg — ABNORMAL LOW (ref 26.0–34.0)
MCHC: 30.1 g/dL (ref 30.0–36.0)
MCV: 85.6 fL (ref 80.0–100.0)
Platelets: 400 10*3/uL (ref 150–400)
RBC: 5.13 MIL/uL — ABNORMAL HIGH (ref 3.87–5.11)
RDW: 14.1 % (ref 11.5–15.5)
WBC: 5.4 10*3/uL (ref 4.0–10.5)
nRBC: 0 % (ref 0.0–0.2)

## 2019-03-21 LAB — BASIC METABOLIC PANEL
Anion gap: 10 (ref 5–15)
BUN: 12 mg/dL (ref 6–20)
CO2: 23 mmol/L (ref 22–32)
Calcium: 9.9 mg/dL (ref 8.9–10.3)
Chloride: 108 mmol/L (ref 98–111)
Creatinine, Ser: 0.99 mg/dL (ref 0.44–1.00)
GFR calc Af Amer: >60 mL/min (ref >60)
GFR calc non Af Amer: >60 mL/min (ref >60)
Glucose, Bld: 98 mg/dL (ref 70–99)
Potassium: 4.2 mmol/L (ref 3.5–5.1)
Sodium: 141 mmol/L (ref 135–145)

## 2019-03-21 LAB — TROPONIN I (HIGH SENSITIVITY): Troponin I (High Sensitivity): 6 ng/L (ref <18)

## 2019-03-21 LAB — I-STAT BETA HCG BLOOD, ED (MC, WL, AP ONLY): I-stat hCG, quantitative: 7 m[IU]/mL — ABNORMAL HIGH (ref <5)

## 2019-03-21 MED ORDER — SODIUM CHLORIDE 0.9% FLUSH: 3.0000 mL | Freq: Once | INTRAVENOUS | Status: DC

## 2019-03-21 MED ORDER — ACETAMINOPHEN 325 MG PO TABS
650.0000 mg | ORAL_TABLET | Freq: Once | ORAL | Status: AC
  Administered 2019-03-21: 650 mg via ORAL
  Filled 2019-03-21: qty 2

## 2019-03-21 MED ORDER — ESCITALOPRAM OXALATE 20 MG PO TABS: 20.0000 mg | ORAL_TABLET | Freq: Every day | ORAL | 0 refills | Status: DC

## 2019-03-21 NOTE — Telephone Encounter (Signed)
Returned call to pt from message left earlier today.  Noted that pt was in the ER today with chest pain.  Reminded pt that she was scheduled for cardiac rehab orientation but this was put on hold because she needed to have a thoracentesis completed.  Pt did have this procedure completed and initially her follow up appt with Dr. Cliffton Asters was on 2/26.  Noted that she  Now has an appt on 2/12 requested by pt with APP to talk about her chest discomfort. Advised her that after she has been seen and cleared we can reschedule her for her initial assessment for virtual cardiac rehab.  Pt stated that she was eager to continue with exercising and she had already done some at [employer].  Asked pt what she had done and she said she has been on the treadmill no incline and lifting weights.  Reminded pt of weight lifting restriction and pt was "shocked" and stated she had never been told she needed to avoid heavy lifting.  Asked her what type of lifting she has been doing she said she has been lifting her granddaughter who is over 20 pounds. Certainly could be the cause of her discomfort since she says movement makes it worse. Asked her to abstain from heavy lifting until seen on Friday to see if any difference noted.   Pt stated that she was also going to see if she could get her cardiologist appt with Dr. Katrinka Blazing moved up.  Right now, she scheduled for the 24th. Advised pt that I will follow up with her on 2/12 after the completion of her CV surgical appt. Alanson Aly, BSN Cardiac and Emergency planning/management officer

## 2019-03-21 NOTE — Telephone Encounter (Signed)
The patient walked into our office with CP, elevated BP (173/113) and nervousness.  While I was discussing symptoms and suggesting the ED for further evaluation, she walked away without acknowledgement.  I asked her to f/u, once evaluated.

## 2019-03-21 NOTE — ED Notes (Signed)
Pt brought to room from xray.

## 2019-03-21 NOTE — ED Notes (Signed)
Pt verbalized understanding of discharge instructions. Prescriptions and follow up care reviewed, pt had no further questions. 

## 2019-03-21 NOTE — Telephone Encounter (Signed)
Pt c/o BP issue: STAT if pt c/o blurred vision, one-sided weakness or slurred speech  1. What are your last 5 BP readings?  173/113  2. Are you having any other symptoms (ex. Dizziness, headache, blurred vision, passed out)?  Blurry vision  3. What is your BP issue? Pt was at another Doctors appt today and it was high, and she got nervous.
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
## 2019-03-21 NOTE — Telephone Encounter (Signed)
Pt calls office earlier today to report persistent chest/sternal/shoulder pain since CABG on 01/18/19. She says "it hasn't improved the way it should have by now." She denies sharp pain or shortness of breath. Pt states she has not been taking any medication for pain management. Advised her to take OTC pain medication as directed. Pt reports that her cardiologist sent her to the ED this morning d/t elevated BP, and she was checking in at the time of phone call. She states she wants to see a provider at [facility] as soon as possible to discuss concerns. OV scheduled for 03/23/19.

## 2019-03-21 NOTE — ED Notes (Signed)
[name] PA at bedside 

## 2019-03-21 NOTE — ED Triage Notes (Signed)
Pt arrives to ED with CP and palpations also concerned due to HTN.Pt states she has bypass surgery 01/18/19. Pt states pain started 2 days ago.

## 2019-03-21 NOTE — Discharge Instructions (Signed)
Your work-up today was reassuring.  Please follow-up with your surgeon, as planned. Follow-up with your cardiologist regarding your blood pressure. Return to the emergency department for fever, increased chest pain, shortness of breath, passing out, dizziness, abdominal pain, or any other major concerns.

## 2019-03-22 ENCOUNTER — Other Ambulatory Visit: Payer: Self-pay | Admitting: Physician Assistant

## 2019-03-22 MED ORDER — TRAMADOL HCL 50 MG PO TABS: 50.0000 mg | ORAL_TABLET | Freq: Four times a day (QID) | ORAL | 0 refills | Status: DC | PRN

## 2019-03-22 NOTE — Progress Notes (Signed)
[name] is s/p CABG x 4 on 01/18/2019. She has been having incisional sternal soreness/pain and shoulder soreness/ pain for some time post op. She did admit to lifting grandchild fairly readily who is 20 pounds. She was instructed and on the discharge paperwork, it recommended not to lift more than 10 pounds. She was in the ED yesterday, at cardiology's recommendation, for high blood pressure, palpitations, and above symptoms. Chest xray showed small left pleural effusion , sternal wires appear intact. She contacted [facility] office about chest/shoulder soreness/pain. Nurse advised her to take Tylenol and patient requested to be seen. I spoke with [facility] on her cell phone this am. She states Tylenol is helping. I offered to call a refill (less so than initial prescription) in for Ultram. She states it makes her sleepy so I suggested she take one at night PRN and Tylenol PRN (as directed) during the day. I also re iterated not lifting more than a gallon of milk for the next few weeks. She will contact us next week if symptoms do not subside or worsen and we will evaluate her in the office then.
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
## 2019-03-23 ENCOUNTER — Ambulatory Visit: Payer: Self-pay

## 2019-03-26 ENCOUNTER — Ambulatory Visit (INDEPENDENT_AMBULATORY_CARE_PROVIDER_SITE_OTHER): Payer: Self-pay | Admitting: Nurse Practitioner

## 2019-03-26 ENCOUNTER — Encounter (HOSPITAL_COMMUNITY): Payer: Self-pay | Admitting: *Deleted

## 2019-03-26 ENCOUNTER — Emergency Department (HOSPITAL_COMMUNITY)
Admission: EM | Admit: 2019-03-26 | Discharge: 2019-03-26 | Disposition: A | Discharge status: Home / Self Care | Payer: Self-pay | Attending: Emergency Medicine | Admitting: Emergency Medicine

## 2019-03-26 ENCOUNTER — Encounter: Payer: Self-pay | Admitting: Nurse Practitioner

## 2019-03-26 ENCOUNTER — Other Ambulatory Visit: Payer: Self-pay

## 2019-03-26 ENCOUNTER — Emergency Department (HOSPITAL_COMMUNITY): Payer: Self-pay

## 2019-03-26 VITALS — BP 148/82 | HR 64 | Ht 61.0 in | Wt 175.0 lb

## 2019-03-26 DIAGNOSIS — Z87891 Personal history of nicotine dependence: Secondary | ICD-10-CM | POA: Insufficient documentation

## 2019-03-26 DIAGNOSIS — Z91040 Latex allergy status: Secondary | ICD-10-CM | POA: Insufficient documentation

## 2019-03-26 DIAGNOSIS — R0789 Other chest pain: Secondary | ICD-10-CM | POA: Insufficient documentation

## 2019-03-26 DIAGNOSIS — I10 Essential (primary) hypertension: Secondary | ICD-10-CM

## 2019-03-26 DIAGNOSIS — I214 Non-ST elevation (NSTEMI) myocardial infarction: Secondary | ICD-10-CM

## 2019-03-26 DIAGNOSIS — I2510 Atherosclerotic heart disease of native coronary artery without angina pectoris: Secondary | ICD-10-CM | POA: Insufficient documentation

## 2019-03-26 DIAGNOSIS — Z951 Presence of aortocoronary bypass graft: Secondary | ICD-10-CM | POA: Insufficient documentation

## 2019-03-26 DIAGNOSIS — Z79899 Other long term (current) drug therapy: Secondary | ICD-10-CM | POA: Insufficient documentation

## 2019-03-26 DIAGNOSIS — I5032 Chronic diastolic (congestive) heart failure: Secondary | ICD-10-CM | POA: Insufficient documentation

## 2019-03-26 DIAGNOSIS — F419 Anxiety disorder, unspecified: Secondary | ICD-10-CM | POA: Insufficient documentation

## 2019-03-26 DIAGNOSIS — I110 Hypertensive heart disease with heart failure: Secondary | ICD-10-CM | POA: Insufficient documentation

## 2019-03-26 DIAGNOSIS — E782 Mixed hyperlipidemia: Secondary | ICD-10-CM

## 2019-03-26 DIAGNOSIS — Z7982 Long term (current) use of aspirin: Secondary | ICD-10-CM | POA: Insufficient documentation

## 2019-03-26 DIAGNOSIS — I259 Chronic ischemic heart disease, unspecified: Secondary | ICD-10-CM

## 2019-03-26 LAB — CBC WITH DIFFERENTIAL/PLATELET
Abs Immature Granulocytes: 0.01 10*3/uL (ref 0.00–0.07)
Basophils Absolute: 0.1 10*3/uL (ref 0.0–0.1)
Basophils Relative: 1 %
Eosinophils Absolute: 0.5 10*3/uL (ref 0.0–0.5)
Eosinophils Relative: 8 %
HCT: 42.6 % (ref 36.0–46.0)
Hemoglobin: 13 g/dL (ref 12.0–15.0)
Immature Granulocytes: 0 %
Lymphocytes Relative: 21 %
Lymphs Abs: 1.5 10*3/uL (ref 0.7–4.0)
MCH: 25.8 pg — ABNORMAL LOW (ref 26.0–34.0)
MCHC: 30.5 g/dL (ref 30.0–36.0)
MCV: 84.5 fL (ref 80.0–100.0)
Monocytes Absolute: 0.4 10*3/uL (ref 0.1–1.0)
Monocytes Relative: 6 %
Neutro Abs: 4.5 10*3/uL (ref 1.7–7.7)
Neutrophils Relative %: 64 %
Platelets: 360 10*3/uL (ref 150–400)
RBC: 5.04 MIL/uL (ref 3.87–5.11)
RDW: 14.4 % (ref 11.5–15.5)
WBC: 7 10*3/uL (ref 4.0–10.5)
nRBC: 0 % (ref 0.0–0.2)

## 2019-03-26 LAB — LIPID PANEL
Chol/HDL Ratio: 2.2 ratio (ref 0.0–4.4)
Cholesterol, Total: 103 mg/dL (ref 100–199)
HDL: 46 mg/dL (ref >39)
LDL Chol Calc (NIH): 36 mg/dL (ref 0–99)
Triglycerides: 118 mg/dL (ref 0–149)
VLDL Cholesterol Cal: 21 mg/dL (ref 5–40)

## 2019-03-26 LAB — TROPONIN I (HIGH SENSITIVITY): Troponin I (High Sensitivity): 19 ng/L — ABNORMAL HIGH (ref <18)

## 2019-03-26 LAB — BASIC METABOLIC PANEL
Anion gap: 12 (ref 5–15)
BUN: 6 mg/dL (ref 6–20)
CO2: 21 mmol/L — ABNORMAL LOW (ref 22–32)
Calcium: 9.5 mg/dL (ref 8.9–10.3)
Chloride: 108 mmol/L (ref 98–111)
Creatinine, Ser: 0.76 mg/dL (ref 0.44–1.00)
GFR calc Af Amer: >60 mL/min (ref >60)
GFR calc non Af Amer: >60 mL/min (ref >60)
Glucose, Bld: 118 mg/dL — ABNORMAL HIGH (ref 70–99)
Potassium: 3.8 mmol/L (ref 3.5–5.1)
Sodium: 141 mmol/L (ref 135–145)

## 2019-03-26 MED ORDER — ROSUVASTATIN CALCIUM 40 MG PO TABS: 40.0000 mg | ORAL_TABLET | Freq: Every day | ORAL | 3 refills | Status: DC

## 2019-03-26 MED ORDER — ONDANSETRON HCL 4 MG/2ML IJ SOLN
4.0000 mg | Freq: Once | INTRAMUSCULAR | Status: AC
  Administered 2019-03-26: 4 mg via INTRAVENOUS
  Filled 2019-03-26: qty 2

## 2019-03-26 MED ORDER — AMLODIPINE BESYLATE 5 MG PO TABS: 5.0000 mg | ORAL_TABLET | Freq: Every day | ORAL | 3 refills | Status: DC

## 2019-03-26 MED ORDER — HYDROMORPHONE HCL 1 MG/ML IJ SOLN
1.0000 mg | Freq: Once | INTRAMUSCULAR | Status: AC
  Administered 2019-03-26: 1 mg via INTRAVENOUS
  Filled 2019-03-26: qty 1

## 2019-03-26 NOTE — ED Notes (Signed)
Waiting for son to come and pick her up
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
## 2019-03-26 NOTE — Patient Instructions (Addendum)
After Visit Summary:  We will be checking the following labs today - Lipids   Medication Instructions:    Continue with your current medicines. BUT  I am adding Norvasc 5mg  to take each morning - this is for your BP - this is at your pharmacy - start today.    If you need a refill on your cardiac medications before your next appointment, please call your pharmacy.     Testing/Procedures To Be Arranged:  N/A  Follow-Up:   See back in about 3 to 4 weeks.     At [facility], you and your health needs are our priority.  As part of our continuing mission to provide you with exceptional heart care, we have created designated Provider Care Teams.  These Care Teams include your primary Cardiologist (physician) and Advanced Practice Providers (APPs -  Physician Assistants and Nurse Practitioners) who all work together to provide you with the care you need, when you need it.  Special Instructions:  . Stay safe, stay home, wash your hands for at least 20 seconds and wear a mask when out in public. [facility] to return to work tomorrow . Keep up the good work with walking.      Call the [facility] office at [phone] if you have any questions, problems or concerns.

## 2019-03-27 ENCOUNTER — Telehealth (HOSPITAL_COMMUNITY): Payer: Self-pay | Admitting: *Deleted

## 2019-03-27 NOTE — Telephone Encounter (Signed)
Attempted to call pt in regards to scheduling her initial assessment for virtual cardiac rehab.  Unable to reach pt and unable to leave message for pt.  Voicemail box has not been set up.  Plan to see how her next appt with Dr. Cliffton Asters on 2/26 in regards to her chest wall discomfort and hypertension. Alanson Aly, BSN Cardiac and Emergency planning/management officer

## 2019-04-04 ENCOUNTER — Ambulatory Visit: Payer: Self-pay | Admitting: Interventional Cardiology

## 2019-04-05 ENCOUNTER — Other Ambulatory Visit: Payer: Self-pay | Admitting: Thoracic Surgery (Cardiothoracic Vascular Surgery)

## 2019-04-05 DIAGNOSIS — Z951 Presence of aortocoronary bypass graft: Secondary | ICD-10-CM

## 2019-04-06 ENCOUNTER — Encounter: Payer: Self-pay | Admitting: Thoracic Surgery (Cardiothoracic Vascular Surgery)

## 2019-04-06 ENCOUNTER — Ambulatory Visit
Admission: RE | Admit: 2019-04-06 | Discharge: 2019-04-06 | Disposition: A | Discharge status: Home / Self Care | Payer: Self-pay | Source: Ambulatory Visit | Attending: Thoracic Surgery (Cardiothoracic Vascular Surgery) | Admitting: Thoracic Surgery (Cardiothoracic Vascular Surgery)

## 2019-04-06 ENCOUNTER — Ambulatory Visit: Payer: Self-pay | Admitting: Thoracic Surgery (Cardiothoracic Vascular Surgery)

## 2019-04-06 ENCOUNTER — Ambulatory Visit (INDEPENDENT_AMBULATORY_CARE_PROVIDER_SITE_OTHER): Payer: Self-pay | Admitting: Thoracic Surgery (Cardiothoracic Vascular Surgery)

## 2019-04-06 ENCOUNTER — Other Ambulatory Visit: Payer: Self-pay

## 2019-04-06 VITALS — BP 182/105 | HR 73 | Temp 97.5°F | Resp 20 | Ht 61.0 in | Wt 175.0 lb

## 2019-04-06 DIAGNOSIS — I25119 Atherosclerotic heart disease of native coronary artery with unspecified angina pectoris: Secondary | ICD-10-CM

## 2019-04-06 DIAGNOSIS — Z951 Presence of aortocoronary bypass graft: Secondary | ICD-10-CM
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
## 2019-04-11 ENCOUNTER — Telehealth (HOSPITAL_COMMUNITY): Payer: Self-pay

## 2019-04-11 ENCOUNTER — Encounter (HOSPITAL_COMMUNITY): Payer: Self-pay

## 2019-04-11 NOTE — Telephone Encounter (Signed)
Attempted to call patient in regards to Cardiac Rehab, unable to leave a VM.  Mailed letter

## 2019-04-12 ENCOUNTER — Telehealth: Payer: Self-pay | Admitting: Nurse Practitioner

## 2019-04-12 NOTE — Telephone Encounter (Signed)
Pt c/o medication issue:  1. Name of Medication: ondansetron hcl 4 mg   2. How are you currently taking this medication (dosage and times per day)? Pt has not started taking this medication yet   3. Are you having a reaction (difficulty breathing--STAT)? no  4. What is your medication issue? Pt was prescribed this medication by her PCP and wanted to make sure it did not interact with any of her cardiac medications before she took it

## 2019-04-12 NOTE — Telephone Encounter (Signed)
Pt called and verbalized understanding about and will only use it once or twice since she is struggling with a "stomach virus".

## 2019-04-12 NOTE — Telephone Encounter (Signed)
Ok to use Zofran on an as needed basis. Would avoid chronic use if possible as Zofran can cause QTc prolongation and she already takes Lexapro and Strattera which can also prolong QTc. Recent EKGs show normal QTc. Zofran can also increase risk of serotonin syndrome since she also takes Lexapro and buspirone - this is rare but can present with excess sweating, tremor, and altered mental status.

## 2019-04-12 NOTE — Telephone Encounter (Signed)
LM for pt to call back.,. will forward to the [facility] for review prior to the pt calling back.

## 2019-04-24 ENCOUNTER — Other Ambulatory Visit: Payer: Self-pay

## 2019-04-24 ENCOUNTER — Emergency Department (HOSPITAL_COMMUNITY): Payer: Self-pay

## 2019-04-24 ENCOUNTER — Encounter (HOSPITAL_COMMUNITY): Payer: Self-pay | Admitting: Emergency Medicine

## 2019-04-24 ENCOUNTER — Inpatient Hospital Stay (HOSPITAL_COMMUNITY)
Admission: EM | Admit: 2019-04-24 | Discharge: 2019-04-29 | DRG: 247 | Disposition: A | Discharge status: Home / Self Care | Payer: Self-pay | Attending: Cardiology | Admitting: Cardiology

## 2019-04-24 ENCOUNTER — Encounter (HOSPITAL_COMMUNITY)
Admission: EM | Disposition: A | Discharge status: Home / Self Care | Payer: Self-pay | Source: Home / Self Care | Attending: Cardiology

## 2019-04-24 DIAGNOSIS — Z20822 Contact with and (suspected) exposure to covid-19: Secondary | ICD-10-CM | POA: Diagnosis present

## 2019-04-24 DIAGNOSIS — I10 Essential (primary) hypertension: Secondary | ICD-10-CM | POA: Diagnosis present

## 2019-04-24 DIAGNOSIS — I110 Hypertensive heart disease with heart failure: Secondary | ICD-10-CM | POA: Diagnosis present

## 2019-04-24 DIAGNOSIS — Z8674 Personal history of sudden cardiac arrest: Secondary | ICD-10-CM

## 2019-04-24 DIAGNOSIS — Z87442 Personal history of urinary calculi: Secondary | ICD-10-CM

## 2019-04-24 DIAGNOSIS — Z955 Presence of coronary angioplasty implant and graft: Secondary | ICD-10-CM

## 2019-04-24 DIAGNOSIS — I252 Old myocardial infarction: Secondary | ICD-10-CM

## 2019-04-24 DIAGNOSIS — Z87440 Personal history of urinary (tract) infections: Secondary | ICD-10-CM

## 2019-04-24 DIAGNOSIS — E785 Hyperlipidemia, unspecified: Secondary | ICD-10-CM | POA: Diagnosis present

## 2019-04-24 DIAGNOSIS — I2510 Atherosclerotic heart disease of native coronary artery without angina pectoris: Secondary | ICD-10-CM | POA: Diagnosis present

## 2019-04-24 DIAGNOSIS — T82855A Stenosis of coronary artery stent, initial encounter: Secondary | ICD-10-CM | POA: Diagnosis present

## 2019-04-24 DIAGNOSIS — Z7982 Long term (current) use of aspirin: Secondary | ICD-10-CM

## 2019-04-24 DIAGNOSIS — F319 Bipolar disorder, unspecified: Secondary | ICD-10-CM | POA: Diagnosis present

## 2019-04-24 DIAGNOSIS — I214 Non-ST elevation (NSTEMI) myocardial infarction: Principal | ICD-10-CM

## 2019-04-24 DIAGNOSIS — Z91040 Latex allergy status: Secondary | ICD-10-CM

## 2019-04-24 DIAGNOSIS — G4733 Obstructive sleep apnea (adult) (pediatric): Secondary | ICD-10-CM | POA: Diagnosis present

## 2019-04-24 DIAGNOSIS — Z720 Tobacco use: Secondary | ICD-10-CM | POA: Diagnosis present

## 2019-04-24 DIAGNOSIS — F1110 Opioid abuse, uncomplicated: Secondary | ICD-10-CM | POA: Diagnosis present

## 2019-04-24 DIAGNOSIS — Z888 Allergy status to other drugs, medicaments and biological substances status: Secondary | ICD-10-CM

## 2019-04-24 DIAGNOSIS — K219 Gastro-esophageal reflux disease without esophagitis: Secondary | ICD-10-CM | POA: Diagnosis present

## 2019-04-24 DIAGNOSIS — F419 Anxiety disorder, unspecified: Secondary | ICD-10-CM | POA: Diagnosis present

## 2019-04-24 DIAGNOSIS — I2582 Chronic total occlusion of coronary artery: Secondary | ICD-10-CM | POA: Diagnosis present

## 2019-04-24 DIAGNOSIS — F1010 Alcohol abuse, uncomplicated: Secondary | ICD-10-CM | POA: Diagnosis present

## 2019-04-24 DIAGNOSIS — Z885 Allergy status to narcotic agent status: Secondary | ICD-10-CM

## 2019-04-24 DIAGNOSIS — I5032 Chronic diastolic (congestive) heart failure: Secondary | ICD-10-CM | POA: Diagnosis present

## 2019-04-24 DIAGNOSIS — Z981 Arthrodesis status: Secondary | ICD-10-CM

## 2019-04-24 DIAGNOSIS — Z915 Personal history of self-harm: Secondary | ICD-10-CM

## 2019-04-24 DIAGNOSIS — Z884 Allergy status to anesthetic agent status: Secondary | ICD-10-CM

## 2019-04-24 DIAGNOSIS — Z79899 Other long term (current) drug therapy: Secondary | ICD-10-CM

## 2019-04-24 DIAGNOSIS — Z87892 Personal history of anaphylaxis: Secondary | ICD-10-CM

## 2019-04-24 DIAGNOSIS — I25810 Atherosclerosis of coronary artery bypass graft(s) without angina pectoris: Secondary | ICD-10-CM | POA: Diagnosis present

## 2019-04-24 DIAGNOSIS — Z87891 Personal history of nicotine dependence: Secondary | ICD-10-CM

## 2019-04-24 DIAGNOSIS — Z951 Presence of aortocoronary bypass graft: Secondary | ICD-10-CM

## 2019-04-24 DIAGNOSIS — Z8249 Family history of ischemic heart disease and other diseases of the circulatory system: Secondary | ICD-10-CM

## 2019-04-24 DIAGNOSIS — Z818 Family history of other mental and behavioral disorders: Secondary | ICD-10-CM

## 2019-04-24 HISTORY — PX: LEFT HEART CATH AND CORS/GRAFTS ANGIOGRAPHY: CATH118250

## 2019-04-24 LAB — HEPATIC FUNCTION PANEL
ALT: 17 U/L (ref 0–44)
AST: 40 U/L (ref 15–41)
Albumin: 3.6 g/dL (ref 3.5–5.0)
Alkaline Phosphatase: 68 U/L (ref 38–126)
Bilirubin, Direct: 0.2 mg/dL (ref 0.0–0.2)
Indirect Bilirubin: 0.4 mg/dL (ref 0.3–0.9)
Total Bilirubin: 0.6 mg/dL (ref 0.3–1.2)
Total Protein: 7 g/dL (ref 6.5–8.1)

## 2019-04-24 LAB — CBC
HCT: 40.6 % (ref 36.0–46.0)
Hemoglobin: 12.2 g/dL (ref 12.0–15.0)
MCH: 26.3 pg (ref 26.0–34.0)
MCHC: 30 g/dL (ref 30.0–36.0)
MCV: 87.5 fL (ref 80.0–100.0)
Platelets: 283 10*3/uL (ref 150–400)
RBC: 4.64 MIL/uL (ref 3.87–5.11)
RDW: 16.8 % — ABNORMAL HIGH (ref 11.5–15.5)
WBC: 10.5 10*3/uL (ref 4.0–10.5)
nRBC: 0 % (ref 0.0–0.2)

## 2019-04-24 LAB — HEMOGLOBIN A1C
Hgb A1c MFr Bld: 5.9 % — ABNORMAL HIGH (ref 4.8–5.6)
Mean Plasma Glucose: 122.63 mg/dL

## 2019-04-24 LAB — CBG MONITORING, ED: Glucose-Capillary: 98 mg/dL (ref 70–99)

## 2019-04-24 LAB — BASIC METABOLIC PANEL
Anion gap: 9 (ref 5–15)
BUN: 7 mg/dL (ref 6–20)
CO2: 25 mmol/L (ref 22–32)
Calcium: 9.2 mg/dL (ref 8.9–10.3)
Chloride: 106 mmol/L (ref 98–111)
Creatinine, Ser: 0.81 mg/dL (ref 0.44–1.00)
GFR calc Af Amer: >60 mL/min (ref >60)
GFR calc non Af Amer: >60 mL/min (ref >60)
Glucose, Bld: 100 mg/dL — ABNORMAL HIGH (ref 70–99)
Potassium: 4.4 mmol/L (ref 3.5–5.1)
Sodium: 140 mmol/L (ref 135–145)

## 2019-04-24 LAB — APTT: aPTT: 30 seconds (ref 24–36)

## 2019-04-24 LAB — TROPONIN I (HIGH SENSITIVITY)
Troponin I (High Sensitivity): 3582 ng/L (ref <18)
Troponin I (High Sensitivity): 4127 ng/L (ref <18)
Troponin I (High Sensitivity): 3418 ng/L (ref <18)

## 2019-04-24 LAB — RESPIRATORY PANEL BY RT PCR (FLU A&B, COVID)
Influenza A by PCR: NEGATIVE
Influenza B by PCR: NEGATIVE
SARS Coronavirus 2 by RT PCR: NEGATIVE

## 2019-04-24 LAB — SARS CORONAVIRUS 2 (TAT 6-24 HRS): SARS Coronavirus 2: NEGATIVE

## 2019-04-24 LAB — I-STAT BETA HCG BLOOD, ED (MC, WL, AP ONLY): I-stat hCG, quantitative: <5 m[IU]/mL (ref <5)

## 2019-04-24 LAB — PROTIME-INR
INR: 1.2 (ref 0.8–1.2)
Prothrombin Time: 14.8 seconds (ref 11.4–15.2)

## 2019-04-24 SURGERY — LEFT HEART CATH AND CORS/GRAFTS ANGIOGRAPHY
Anesthesia: LOCAL

## 2019-04-24 MED ORDER — OXYCODONE HCL 5 MG PO TABS
5.0000 mg | ORAL_TABLET | ORAL | Status: DC | PRN
  Administered 2019-04-25: 10 mg via ORAL
  Administered 2019-04-25 – 2019-04-26 (×2): 5 mg via ORAL
  Administered 2019-04-26: 10 mg via ORAL
  Administered 2019-04-26: 08:00:00 5 mg via ORAL
  Administered 2019-04-27: 10 mg via ORAL
  Administered 2019-04-27: 13:00:00 5 mg via ORAL
  Administered 2019-04-28 (×3): 10 mg via ORAL
  Filled 2019-04-24 (×3): qty 2
  Filled 2019-04-24: qty 1
  Filled 2019-04-24: qty 2
  Filled 2019-04-24: qty 1
  Filled 2019-04-24: qty 2
  Filled 2019-04-24 (×3): qty 1
  Filled 2019-04-24: qty 2
  Filled 2019-04-24: qty 1

## 2019-04-24 MED ORDER — SODIUM CHLORIDE 0.9% FLUSH: 3.0000 mL | INTRAVENOUS | Status: DC | PRN

## 2019-04-24 MED ORDER — SODIUM CHLORIDE 0.9 % IV SOLN: 250.0000 mL | INTRAVENOUS | Status: DC | PRN

## 2019-04-24 MED ORDER — NITROGLYCERIN 1 MG/10 ML FOR IR/CATH LAB
INTRA_ARTERIAL | Status: AC
  Filled 2019-04-24: qty 10

## 2019-04-24 MED ORDER — HEPARIN (PORCINE) IN NACL 1000-0.9 UT/500ML-% IV SOLN
INTRAVENOUS | Status: DC | PRN
  Administered 2019-04-24 (×2): 500 mL

## 2019-04-24 MED ORDER — FENTANYL CITRATE (PF) 100 MCG/2ML IJ SOLN
INTRAMUSCULAR | Status: DC | PRN
  Administered 2019-04-24: 25 ug via INTRAVENOUS

## 2019-04-24 MED ORDER — AMLODIPINE BESYLATE 5 MG PO TABS
5.0000 mg | ORAL_TABLET | Freq: Every day | ORAL | Status: DC
  Administered 2019-04-24 – 2019-04-29 (×6): 5 mg via ORAL
  Filled 2019-04-24 (×6): qty 1

## 2019-04-24 MED ORDER — HEPARIN SODIUM (PORCINE) 1000 UNIT/ML IJ SOLN
INTRAMUSCULAR | Status: AC
  Filled 2019-04-24: qty 1

## 2019-04-24 MED ORDER — SODIUM CHLORIDE 0.9 % WEIGHT BASED INFUSION: 1.0000 mL/kg/h | INTRAVENOUS | Status: DC

## 2019-04-24 MED ORDER — HEPARIN SODIUM (PORCINE) 1000 UNIT/ML IJ SOLN
INTRAMUSCULAR | Status: DC | PRN
  Administered 2019-04-24: 4000 [IU] via INTRAVENOUS

## 2019-04-24 MED ORDER — SODIUM CHLORIDE 0.9% FLUSH
3.0000 mL | Freq: Two times a day (BID) | INTRAVENOUS | Status: DC
  Administered 2019-04-25: 3 mL via INTRAVENOUS

## 2019-04-24 MED ORDER — LORAZEPAM 2 MG/ML IJ SOLN
0.5000 mg | Freq: Once | INTRAMUSCULAR | Status: AC
  Administered 2019-04-24: 0.5 mg via INTRAVENOUS
  Filled 2019-04-24: qty 1

## 2019-04-24 MED ORDER — SODIUM CHLORIDE 0.9 % WEIGHT BASED INFUSION
3.0000 mL/kg/h | INTRAVENOUS | Status: DC
  Administered 2019-04-25: 3 mL/kg/h via INTRAVENOUS

## 2019-04-24 MED ORDER — NITROGLYCERIN 2 % TD OINT: 0.5000 [in_us] | TOPICAL_OINTMENT | Freq: Four times a day (QID) | TRANSDERMAL | Status: DC

## 2019-04-24 MED ORDER — QUETIAPINE FUMARATE 25 MG PO TABS
25.0000 mg | ORAL_TABLET | Freq: Every day | ORAL | Status: DC
  Administered 2019-04-24 – 2019-04-28 (×5): 25 mg via ORAL
  Filled 2019-04-24 (×6): qty 1

## 2019-04-24 MED ORDER — TICAGRELOR 90 MG PO TABS: 90.0000 mg | ORAL_TABLET | Freq: Two times a day (BID) | ORAL | Status: DC

## 2019-04-24 MED ORDER — ESCITALOPRAM OXALATE 10 MG PO TABS
20.0000 mg | ORAL_TABLET | Freq: Every day | ORAL | Status: DC
  Administered 2019-04-24 – 2019-04-29 (×6): 20 mg via ORAL
  Filled 2019-04-24: qty 2
  Filled 2019-04-24: qty 1
  Filled 2019-04-24 (×2): qty 2
  Filled 2019-04-24: qty 1
  Filled 2019-04-24 (×3): qty 2

## 2019-04-24 MED ORDER — IOHEXOL 350 MG/ML SOLN
INTRAVENOUS | Status: AC
  Filled 2019-04-24: qty 1

## 2019-04-24 MED ORDER — TICAGRELOR 90 MG PO TABS
180.0000 mg | ORAL_TABLET | Freq: Once | ORAL | Status: DC
  Filled 2019-04-24: qty 2

## 2019-04-24 MED ORDER — HEPARIN (PORCINE) IN NACL 1000-0.9 UT/500ML-% IV SOLN
INTRAVENOUS | Status: AC
  Filled 2019-04-24: qty 1000

## 2019-04-24 MED ORDER — SODIUM CHLORIDE 0.9% FLUSH: 3.0000 mL | Freq: Two times a day (BID) | INTRAVENOUS | Status: DC

## 2019-04-24 MED ORDER — MIDAZOLAM HCL 2 MG/2ML IJ SOLN
INTRAMUSCULAR | Status: DC | PRN
  Administered 2019-04-24 (×2): 1 mg via INTRAVENOUS

## 2019-04-24 MED ORDER — ACETAMINOPHEN 325 MG PO TABS
650.0000 mg | ORAL_TABLET | ORAL | Status: DC | PRN
  Administered 2019-04-24 – 2019-04-25 (×2): 650 mg via ORAL
  Filled 2019-04-24 (×2): qty 2

## 2019-04-24 MED ORDER — ASPIRIN EC 325 MG PO TBEC
325.0000 mg | DELAYED_RELEASE_TABLET | Freq: Every day | ORAL | Status: DC
  Administered 2019-04-24: 18:00:00 325 mg via ORAL
  Filled 2019-04-24: qty 1

## 2019-04-24 MED ORDER — SODIUM CHLORIDE 0.9 % IV SOLN: INTRAVENOUS | Status: AC

## 2019-04-24 MED ORDER — SODIUM CHLORIDE 0.9 % WEIGHT BASED INFUSION: 3.0000 mL/kg/h | INTRAVENOUS | Status: AC

## 2019-04-24 MED ORDER — BUSPIRONE HCL 5 MG PO TABS
10.0000 mg | ORAL_TABLET | Freq: Three times a day (TID) | ORAL | Status: DC
  Administered 2019-04-24 – 2019-04-29 (×15): 10 mg via ORAL
  Filled 2019-04-24: qty 1
  Filled 2019-04-24 (×3): qty 2
  Filled 2019-04-24: qty 1
  Filled 2019-04-24 (×4): qty 2
  Filled 2019-04-24: qty 1
  Filled 2019-04-24 (×8): qty 2

## 2019-04-24 MED ORDER — HEPARIN (PORCINE) 25000 UT/250ML-% IV SOLN
800.0000 [IU]/h | INTRAVENOUS | Status: DC
  Administered 2019-04-25: 800 [IU]/h via INTRAVENOUS

## 2019-04-24 MED ORDER — ONDANSETRON HCL 4 MG/2ML IJ SOLN: 4.0000 mg | Freq: Four times a day (QID) | INTRAMUSCULAR | Status: DC | PRN

## 2019-04-24 MED ORDER — HEPARIN BOLUS VIA INFUSION
4000.0000 [IU] | Freq: Once | INTRAVENOUS | Status: AC
  Administered 2019-04-24: 14:00:00 4000 [IU] via INTRAVENOUS
  Filled 2019-04-24: qty 4000

## 2019-04-24 MED ORDER — HYDRALAZINE HCL 20 MG/ML IJ SOLN: 10.0000 mg | INTRAMUSCULAR | Status: AC | PRN

## 2019-04-24 MED ORDER — NITROGLYCERIN 2 % TD OINT
TOPICAL_OINTMENT | TRANSDERMAL | Status: AC
  Filled 2019-04-24: qty 1

## 2019-04-24 MED ORDER — GABAPENTIN 300 MG PO CAPS
300.0000 mg | ORAL_CAPSULE | Freq: Three times a day (TID) | ORAL | Status: DC
  Administered 2019-04-24 – 2019-04-29 (×15): 300 mg via ORAL
  Filled 2019-04-24 (×15): qty 1

## 2019-04-24 MED ORDER — METOPROLOL TARTRATE 25 MG PO TABS
25.0000 mg | ORAL_TABLET | Freq: Two times a day (BID) | ORAL | Status: DC
  Administered 2019-04-24 – 2019-04-29 (×10): 25 mg via ORAL
  Filled 2019-04-24 (×12): qty 1

## 2019-04-24 MED ORDER — PANTOPRAZOLE SODIUM 40 MG PO TBEC
40.0000 mg | DELAYED_RELEASE_TABLET | Freq: Every day | ORAL | Status: DC
  Administered 2019-04-24 – 2019-04-29 (×6): 40 mg via ORAL
  Filled 2019-04-24 (×6): qty 1

## 2019-04-24 MED ORDER — ALPRAZOLAM 0.25 MG PO TABS
0.2500 mg | ORAL_TABLET | Freq: Two times a day (BID) | ORAL | Status: DC | PRN
  Administered 2019-04-24 – 2019-04-25 (×2): 0.25 mg via ORAL
  Filled 2019-04-24 (×3): qty 1

## 2019-04-24 MED ORDER — SODIUM CHLORIDE 0.9 % IV BOLUS
500.0000 mL | Freq: Once | INTRAVENOUS | Status: AC
  Administered 2019-04-24: 500 mL via INTRAVENOUS

## 2019-04-24 MED ORDER — ASPIRIN 81 MG PO CHEW
324.0000 mg | CHEWABLE_TABLET | Freq: Once | ORAL | Status: AC
  Administered 2019-04-24: 324 mg via ORAL
  Filled 2019-04-24: qty 4

## 2019-04-24 MED ORDER — MIDAZOLAM HCL 2 MG/2ML IJ SOLN
INTRAMUSCULAR | Status: AC
  Filled 2019-04-24: qty 2

## 2019-04-24 MED ORDER — ZOLPIDEM TARTRATE 5 MG PO TABS: 5.0000 mg | ORAL_TABLET | Freq: Every evening | ORAL | Status: DC | PRN

## 2019-04-24 MED ORDER — ALBUTEROL SULFATE (2.5 MG/3ML) 0.083% IN NEBU
3.0000 mL | INHALATION_SOLUTION | Freq: Four times a day (QID) | RESPIRATORY_TRACT | Status: DC | PRN
  Administered 2019-04-26 – 2019-04-29 (×3): 3 mL via RESPIRATORY_TRACT
  Filled 2019-04-24 (×4): qty 3

## 2019-04-24 MED ORDER — MORPHINE SULFATE (PF) 4 MG/ML IV SOLN
4.0000 mg | Freq: Once | INTRAVENOUS | Status: AC
  Administered 2019-04-24: 12:00:00 4 mg via INTRAVENOUS
  Filled 2019-04-24: qty 1

## 2019-04-24 MED ORDER — NITROGLYCERIN IN D5W 200-5 MCG/ML-% IV SOLN
0.0000 ug/min | INTRAVENOUS | Status: DC
  Administered 2019-04-24: 13:00:00 5 ug/min via INTRAVENOUS
  Filled 2019-04-24: qty 250

## 2019-04-24 MED ORDER — ACETAMINOPHEN 325 MG PO TABS: 650.0000 mg | ORAL_TABLET | ORAL | Status: DC | PRN

## 2019-04-24 MED ORDER — ASPIRIN 81 MG PO CHEW: 81.0000 mg | CHEWABLE_TABLET | Freq: Every day | ORAL | Status: DC

## 2019-04-24 MED ORDER — TICAGRELOR 90 MG PO TABS
180.0000 mg | ORAL_TABLET | Freq: Once | ORAL | Status: AC
  Administered 2019-04-24: 23:00:00 180 mg via ORAL
  Filled 2019-04-24: qty 2

## 2019-04-24 MED ORDER — LABETALOL HCL 5 MG/ML IV SOLN: 10.0000 mg | INTRAVENOUS | Status: AC | PRN

## 2019-04-24 MED ORDER — NITROGLYCERIN 0.4 MG SL SUBL: 0.4000 mg | SUBLINGUAL_TABLET | SUBLINGUAL | Status: DC | PRN

## 2019-04-24 MED ORDER — CHLOROPROCAINE HCL 2 % IJ SOLN
INTRAMUSCULAR | Status: DC | PRN
  Administered 2019-04-24: 2 mL

## 2019-04-24 MED ORDER — ATOMOXETINE HCL 40 MG PO CAPS
40.0000 mg | ORAL_CAPSULE | Freq: Every morning | ORAL | Status: DC
  Administered 2019-04-27 – 2019-04-28 (×2): 40 mg via ORAL
  Filled 2019-04-24 (×5): qty 1

## 2019-04-24 MED ORDER — VERAPAMIL HCL 2.5 MG/ML IV SOLN
INTRAVENOUS | Status: AC
  Filled 2019-04-24: qty 2

## 2019-04-24 MED ORDER — FENTANYL CITRATE (PF) 100 MCG/2ML IJ SOLN
INTRAMUSCULAR | Status: AC
  Filled 2019-04-24: qty 2

## 2019-04-24 MED ORDER — SODIUM CHLORIDE 0.9% FLUSH
3.0000 mL | Freq: Two times a day (BID) | INTRAVENOUS | Status: DC
  Administered 2019-04-25 – 2019-04-26 (×3): 3 mL via INTRAVENOUS

## 2019-04-24 MED ORDER — LOSARTAN POTASSIUM 25 MG PO TABS
12.5000 mg | ORAL_TABLET | Freq: Every day | ORAL | Status: DC
  Administered 2019-04-24 – 2019-04-29 (×6): 12.5 mg via ORAL
  Filled 2019-04-24 (×6): qty 1

## 2019-04-24 MED ORDER — HEPARIN (PORCINE) 25000 UT/250ML-% IV SOLN
800.0000 [IU]/h | INTRAVENOUS | Status: DC
  Administered 2019-04-24: 14:00:00 800 [IU]/h via INTRAVENOUS
  Filled 2019-04-24: qty 250

## 2019-04-24 MED ORDER — CHLOROPROCAINE HCL 2 % IJ SOLN
30.0000 mL | INTRAMUSCULAR | Status: AC
  Filled 2019-04-24 (×4): qty 30

## 2019-04-24 MED ORDER — VERAPAMIL HCL 2.5 MG/ML IV SOLN
INTRAVENOUS | Status: DC | PRN
  Administered 2019-04-24: 10 mL via INTRA_ARTERIAL

## 2019-04-24 MED ORDER — TICAGRELOR 90 MG PO TABS
90.0000 mg | ORAL_TABLET | Freq: Two times a day (BID) | ORAL | Status: DC
  Administered 2019-04-25 – 2019-04-29 (×9): 90 mg via ORAL
  Filled 2019-04-24 (×9): qty 1

## 2019-04-24 MED ORDER — ASPIRIN 81 MG PO CHEW
81.0000 mg | CHEWABLE_TABLET | ORAL | Status: AC
  Administered 2019-04-25: 06:00:00 81 mg via ORAL
  Filled 2019-04-24: qty 1

## 2019-04-24 MED ORDER — CLOPIDOGREL BISULFATE 75 MG PO TABS: 75.0000 mg | ORAL_TABLET | Freq: Every day | ORAL | Status: DC

## 2019-04-24 MED ORDER — ROSUVASTATIN CALCIUM 20 MG PO TABS
40.0000 mg | ORAL_TABLET | Freq: Every day | ORAL | Status: DC
  Administered 2019-04-24 – 2019-04-29 (×6): 40 mg via ORAL
  Filled 2019-04-24 (×6): qty 2

## 2019-04-24 MED ORDER — ASPIRIN 81 MG PO CHEW
81.0000 mg | CHEWABLE_TABLET | Freq: Every day | ORAL | Status: DC
  Administered 2019-04-26 – 2019-04-29 (×4): 81 mg via ORAL
  Filled 2019-04-24 (×4): qty 1

## 2019-04-24 MED ORDER — IOHEXOL 350 MG/ML SOLN
INTRAVENOUS | Status: DC | PRN
  Administered 2019-04-24: 175 mL via INTRA_ARTERIAL

## 2019-04-24 SURGICAL SUPPLY — 14 items
CATH EXPO 5F MPA-1 (CATHETERS) ×1 IMPLANT
CATH INFINITI 5 FR IM (CATHETERS) ×1 IMPLANT
CATH INFINITI 5 FR LCB (CATHETERS) ×2 IMPLANT
CATH INFINITI 5FR MPB2 (CATHETERS) ×1 IMPLANT
CATH INFINITI 5FR MULTPACK ANG (CATHETERS) ×1 IMPLANT
DEVICE RAD COMP TR BAND LRG (VASCULAR PRODUCTS) ×1 IMPLANT
GLIDESHEATH SLEND A-KIT 6F 22G (SHEATH) ×1 IMPLANT
GUIDEWIRE INQWIRE 1.5J.035X260 (WIRE) IMPLANT
INQWIRE 1.5J .035X260CM (WIRE) ×2
KIT HEART LEFT (KITS) ×2 IMPLANT
PACK CARDIAC CATHETERIZATION (CUSTOM PROCEDURE TRAY) ×2 IMPLANT
SHEATH PROBE COVER 6X72 (BAG) ×1 IMPLANT
TRANSDUCER W/STOPCOCK (MISCELLANEOUS) ×2 IMPLANT
TUBING CIL FLEX 10 FLL-RA (TUBING) ×2 IMPLANT

## 2019-04-24 NOTE — Interval H&P Note (Signed)
Cath Lab Visit (complete for each Cath Lab visit)  Clinical Evaluation Leading to the Procedure:   ACS: Yes.    Non-ACS:    Anginal Classification: CCS III  Anti-ischemic medical therapy: Maximal Therapy (2 or more classes of medications)  Non-Invasive Test Results: No non-invasive testing performed  Prior CABG: Previous CABG      History and Physical Interval Note:  04/24/2019 3:11 PM  [name]  has presented today for surgery, with the diagnosis of urgent.  The various methods of treatment have been discussed with the patient and family. After consideration of risks, benefits and other options for treatment, the patient has consented to  Procedure(s): LEFT HEART CATH AND CORS/GRAFTS ANGIOGRAPHY (N/A) as a surgical intervention.  The patient's history has been reviewed, patient examined, no change in status, stable for surgery.  I have reviewed the patient's chart and labs.  Questions were answered to the patient's satisfaction.     Lyn Records III
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
## 2019-04-24 NOTE — H&P (View-Only) (Signed)
   I reviewed the cath findings with Dr. Katrinka Blazing and with Dr. Kirke Corin.  We discussed the need for PCI.  I also discussed the procedure in depth and went over her graft anatomy.  She had a lot of questions about why her grafts failed so soon after surgery.  All questions were answered for the patient and her son.  We will load the patient with Brilinta.  Precath orders have been placed.  N.p.o. past midnight.  Per the patient's request, I also informed Dr. Cliffton Asters about the patient being in the hospital.  Corky Crafts, MD

## 2019-04-24 NOTE — Interval H&P Note (Signed)
  CABG X 4.  LIMA LAD, RSVG PDA, OM3, D1  History and Physical Interval Note:  04/24/2019 3:12 PM  [name]  has presented today for surgery, with the diagnosis of urgent.  The various methods of treatment have been discussed with the patient and family. After consideration of risks, benefits and other options for treatment, the patient has consented to  Procedure(s): LEFT HEART CATH AND CORS/GRAFTS ANGIOGRAPHY (N/A) as a surgical intervention.  The patient's history has been reviewed, patient examined, no change in status, stable for surgery.  I have reviewed the patient's chart and labs.  Questions were answered to the patient's satisfaction.     Lyn Records III
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
## 2019-04-24 NOTE — ED Notes (Signed)
Dr. Jens Som bedside

## 2019-04-24 NOTE — CV Procedure (Signed)
   Presentation with non-ST elevation myocardial infarction 3 to 4 months after coronary bypass surgery.  Left radial approach using real-time vascular ultrasound for access.  Findings include patent LIMA to LAD with post distal LIMA anastomosis 80% stenosis and 95% stenosis in the LAD beyond the bypass graft insertion site.  Occlusion of the saphenous vein graft to the obtuse marginal.  Occlusion of the saphenous vein graft to the PL  Saphenous vein graft to diagonal is small, contains ostial narrowing, but is patent.  Left main is widely patent  LAD receives competitive flow in the mid segment.  First diagonal also receives competitive flow.  Circumflex patent with large first obtuse marginal widely patent.  The second obtuse marginal contains in-stent restenosis ostially and then is totally occluded at the site of previous bypass graft insertion.  The continuation of the circumflex also has severe stent diffuse disease.  Right coronary is severely diseased with tandem 80% distal stenoses before the PDA, segmental 70% PDA proximal, diffusely diseased in the continuation of the RCA and total occlusion of the used to be a large posterolateral branch.  Impression: Non-ST elevation MI possibly related to occlusion of the saphenous vein graft to the obtuse marginal versus angina related to high-grade stenosis distal to the LIMA insertion site.  Plan: If/when repeat cath is done it should be femoral.  Additional attempts should be made to verify obtuse marginal and right coronary graft occlusion.  Current anatomy is going to be difficult to treat with PCI and if attempted should be from femoral approach.  Will need full interventional team attention and discussion to map a game plan.

## 2019-04-24 NOTE — ED Triage Notes (Signed)
Pt c/o central chest pains that started last night that radiates to her left arm and jaw. Reports hx CABG in December last year.

## 2019-04-24 NOTE — ED Notes (Signed)
Patient currently resting with eyes closed

## 2019-04-24 NOTE — ED Notes (Signed)
Rhonda Barrett, PA turned Nitroglycerin from 7.5 mL/hr to 3 mL/hr due to blood pressure 65/45. New orders to follow. Pt A&Ox4.
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
## 2019-04-24 NOTE — ED Notes (Signed)
Solon Augusta, PA and Dr. Criss Alvine aware patients troponin is 3, 582. Orders to follow.

## 2019-04-24 NOTE — ED Notes (Signed)
Date and time results received: 04/24/19 2:22 PM   Test: troponin Critical Value: 4,127 Name of Provider Notified: [email or web address]  Orders Received? Or Actions Taken?: notified MD

## 2019-04-24 NOTE — Progress Notes (Signed)
   I reviewed the cath findings with Dr. Smith and with Dr. Arida.  We discussed the need for PCI.  I also discussed the procedure in depth and went over her graft anatomy.  She had a lot of questions about why her grafts failed so soon after surgery.  All questions were answered for the patient and her son.  We will load the patient with Brilinta.  Precath orders have been placed.  N.p.o. past midnight.  Per the patient's request, I also informed Dr. Lightfoot about the patient being in the hospital.  Elvy Mclarty S, MD  

## 2019-04-24 NOTE — ED Notes (Signed)
EMS on the way, [facility] not available, pt going to cath lab, Dr. Criss Alvine asked me to call EMS

## 2019-04-25 ENCOUNTER — Inpatient Hospital Stay (HOSPITAL_COMMUNITY): Payer: Self-pay

## 2019-04-25 ENCOUNTER — Inpatient Hospital Stay (HOSPITAL_COMMUNITY)
Admission: EM | Disposition: A | Discharge status: Home / Self Care | Payer: Self-pay | Source: Home / Self Care | Attending: Cardiology

## 2019-04-25 DIAGNOSIS — I214 Non-ST elevation (NSTEMI) myocardial infarction: Secondary | ICD-10-CM

## 2019-04-25 DIAGNOSIS — E782 Mixed hyperlipidemia: Secondary | ICD-10-CM

## 2019-04-25 DIAGNOSIS — I10 Essential (primary) hypertension: Secondary | ICD-10-CM

## 2019-04-25 HISTORY — PX: CORONARY STENT INTERVENTION: CATH118234

## 2019-04-25 LAB — CBC
HCT: 38.9 % (ref 36.0–46.0)
Hemoglobin: 11.7 g/dL — ABNORMAL LOW (ref 12.0–15.0)
MCH: 25.4 pg — ABNORMAL LOW (ref 26.0–34.0)
MCHC: 30.1 g/dL (ref 30.0–36.0)
MCV: 84.6 fL (ref 80.0–100.0)
Platelets: 304 10*3/uL (ref 150–400)
RBC: 4.6 MIL/uL (ref 3.87–5.11)
RDW: 17 % — ABNORMAL HIGH (ref 11.5–15.5)
WBC: 9.8 10*3/uL (ref 4.0–10.5)
nRBC: 0 % (ref 0.0–0.2)

## 2019-04-25 LAB — HEPARIN LEVEL (UNFRACTIONATED): Heparin Unfractionated: <0.1 IU/mL — ABNORMAL LOW (ref 0.30–0.70)

## 2019-04-25 LAB — BASIC METABOLIC PANEL
Anion gap: 11 (ref 5–15)
BUN: 5 mg/dL — ABNORMAL LOW (ref 6–20)
CO2: 20 mmol/L — ABNORMAL LOW (ref 22–32)
Calcium: 8.7 mg/dL — ABNORMAL LOW (ref 8.9–10.3)
Chloride: 108 mmol/L (ref 98–111)
Creatinine, Ser: 0.83 mg/dL (ref 0.44–1.00)
GFR calc Af Amer: >60 mL/min (ref >60)
GFR calc non Af Amer: >60 mL/min (ref >60)
Glucose, Bld: 92 mg/dL (ref 70–99)
Potassium: 4.8 mmol/L (ref 3.5–5.1)
Sodium: 139 mmol/L (ref 135–145)

## 2019-04-25 LAB — TROPONIN I (HIGH SENSITIVITY): Troponin I (High Sensitivity): 3106 ng/L (ref <18)

## 2019-04-25 LAB — ECHOCARDIOGRAM COMPLETE
Height: 61 in
Weight: 2910.4 oz

## 2019-04-25 LAB — POCT ACTIVATED CLOTTING TIME: Activated Clotting Time: 450 seconds

## 2019-04-25 LAB — HIV ANTIBODY (ROUTINE TESTING W REFLEX): HIV Screen 4th Generation wRfx: NONREACTIVE

## 2019-04-25 SURGERY — CORONARY STENT INTERVENTION
Anesthesia: LOCAL

## 2019-04-25 MED ORDER — FENTANYL CITRATE (PF) 100 MCG/2ML IJ SOLN
INTRAMUSCULAR | Status: DC | PRN
  Administered 2019-04-25: 50 ug via INTRAVENOUS
  Administered 2019-04-25: 25 ug via INTRAVENOUS

## 2019-04-25 MED ORDER — NITROGLYCERIN 1 MG/10 ML FOR IR/CATH LAB
INTRA_ARTERIAL | Status: DC | PRN
  Administered 2019-04-25: 200 ug via INTRACORONARY
  Administered 2019-04-25: 100 ug via INTRACORONARY
  Administered 2019-04-25: 200 ug via INTRACORONARY

## 2019-04-25 MED ORDER — BIVALIRUDIN TRIFLUOROACETATE 250 MG IV SOLR
INTRAVENOUS | Status: AC
  Filled 2019-04-25: qty 250

## 2019-04-25 MED ORDER — MIDAZOLAM HCL 2 MG/2ML IJ SOLN
INTRAMUSCULAR | Status: DC | PRN
  Administered 2019-04-25 (×7): 1 mg via INTRAVENOUS

## 2019-04-25 MED ORDER — SODIUM CHLORIDE 0.9% FLUSH: 3.0000 mL | INTRAVENOUS | Status: DC | PRN

## 2019-04-25 MED ORDER — CHLOROPROCAINE HCL 2 % IJ SOLN
INTRAMUSCULAR | Status: DC | PRN
  Administered 2019-04-25: 20 mL

## 2019-04-25 MED ORDER — ALUM & MAG HYDROXIDE-SIMETH 200-200-20 MG/5ML PO SUSP
30.0000 mL | Freq: Once | ORAL | Status: AC
  Administered 2019-04-25: 22:00:00 30 mL via ORAL
  Filled 2019-04-25: qty 30

## 2019-04-25 MED ORDER — BIVALIRUDIN BOLUS VIA INFUSION - CUPID
INTRAVENOUS | Status: DC | PRN
  Administered 2019-04-25: 61.875 mg via INTRAVENOUS

## 2019-04-25 MED ORDER — SODIUM CHLORIDE 0.9 % WEIGHT BASED INFUSION
1.0000 mL/kg/h | INTRAVENOUS | Status: AC
  Administered 2019-04-25: 12:00:00 1 mL/kg/h via INTRAVENOUS

## 2019-04-25 MED ORDER — IOHEXOL 350 MG/ML SOLN
INTRAVENOUS | Status: DC | PRN
  Administered 2019-04-25: 245 mL via INTRACARDIAC

## 2019-04-25 MED ORDER — SODIUM CHLORIDE 0.9% FLUSH
3.0000 mL | Freq: Two times a day (BID) | INTRAVENOUS | Status: DC
  Administered 2019-04-26 – 2019-04-27 (×2): 3 mL via INTRAVENOUS

## 2019-04-25 MED ORDER — NITROGLYCERIN 1 MG/10 ML FOR IR/CATH LAB
INTRA_ARTERIAL | Status: AC
  Filled 2019-04-25: qty 10

## 2019-04-25 MED ORDER — LIDOCAINE HCL (PF) 1 % IJ SOLN
INTRAMUSCULAR | Status: AC
  Filled 2019-04-25: qty 30

## 2019-04-25 MED ORDER — SODIUM CHLORIDE 0.9 % IV SOLN
INTRAVENOUS | Status: AC | PRN
  Administered 2019-04-25: 250 mL via INTRAVENOUS

## 2019-04-25 MED ORDER — SODIUM CHLORIDE 0.9 % IV SOLN
INTRAVENOUS | Status: DC | PRN
  Administered 2019-04-25 (×2): 1.75 mg/kg/h via INTRAVENOUS

## 2019-04-25 MED ORDER — MIDAZOLAM HCL 2 MG/2ML IJ SOLN
INTRAMUSCULAR | Status: AC
  Filled 2019-04-25: qty 2

## 2019-04-25 MED ORDER — SODIUM CHLORIDE 0.9 % IV SOLN: 250.0000 mL | INTRAVENOUS | Status: DC | PRN

## 2019-04-25 MED ORDER — ENOXAPARIN SODIUM 40 MG/0.4ML ~~LOC~~ SOLN
40.0000 mg | SUBCUTANEOUS | Status: DC
  Administered 2019-04-26: 08:00:00 40 mg via SUBCUTANEOUS
  Filled 2019-04-25 (×2): qty 0.4

## 2019-04-25 MED ORDER — HEPARIN (PORCINE) IN NACL 1000-0.9 UT/500ML-% IV SOLN
INTRAVENOUS | Status: AC
  Filled 2019-04-25: qty 1000

## 2019-04-25 MED ORDER — HEPARIN (PORCINE) IN NACL 1000-0.9 UT/500ML-% IV SOLN
INTRAVENOUS | Status: DC | PRN
  Administered 2019-04-25 (×2): 500 mL

## 2019-04-25 MED ORDER — FENTANYL CITRATE (PF) 100 MCG/2ML IJ SOLN
INTRAMUSCULAR | Status: AC
  Filled 2019-04-25: qty 2

## 2019-04-25 SURGICAL SUPPLY — 21 items
BALLN SAPPHIRE 2.0X15 (BALLOONS) ×2
BALLN SAPPHIRE ~~LOC~~ 2.5X15 (BALLOONS) ×1 IMPLANT
BALLN SAPPHIRE ~~LOC~~ 3.25X10 (BALLOONS) ×1 IMPLANT
BALLOON SAPPHIRE 2.0X15 (BALLOONS) IMPLANT
CATH EXPO 5F MPA-1 (CATHETERS) ×1 IMPLANT
CATH INFINITI 5FR AL1 (CATHETERS) ×1 IMPLANT
CATH VISTA GUIDE 6FR IM 90 CM (CATHETERS) ×1 IMPLANT
CATH VISTA GUIDE 6FR XBLAD4 (CATHETERS) ×1 IMPLANT
CLOSURE MYNX CONTROL 6F/7F (Vascular Products) ×1 IMPLANT
KIT ENCORE 26 ADVANTAGE (KITS) ×1 IMPLANT
KIT HEART LEFT (KITS) ×2 IMPLANT
KIT MICROPUNCTURE NIT STIFF (SHEATH) ×1 IMPLANT
PACK CARDIAC CATHETERIZATION (CUSTOM PROCEDURE TRAY) ×2 IMPLANT
SHEATH PINNACLE 6F 10CM (SHEATH) ×1 IMPLANT
SHEATH PROBE COVER 6X72 (BAG) ×1 IMPLANT
STENT RESOLUTE ONYX 2.25X22 (Permanent Stent) ×1 IMPLANT
STENT RESOLUTE ONYX 3.0X12 (Permanent Stent) ×1 IMPLANT
TRANSDUCER W/STOPCOCK (MISCELLANEOUS) ×2 IMPLANT
TUBING CIL FLEX 10 FLL-RA (TUBING) ×2 IMPLANT
WIRE EMERALD 3MM-J .035X150CM (WIRE) ×1 IMPLANT
WIRE RUNTHROUGH .014X180CM (WIRE) ×1 IMPLANT

## 2019-04-25 NOTE — Progress Notes (Signed)
  Echocardiogram 2D Echocardiogram has been performed.  Leta Jungling M 04/25/2019, 2:20 PM

## 2019-04-25 NOTE — Progress Notes (Addendum)
Progress Note  Patient Name: [name] Date of Encounter: 04/25/2019  Primary Cardiologist: Lesleigh Noe, MD   Subjective   Feels well post procedure.  Inpatient Medications    Scheduled Meds: . amLODipine  5 mg Oral Daily  . [START ON 04/26/2019] aspirin  81 mg Oral Daily  . atomoxetine  40 mg Oral q morning - 10a  . busPIRone  10 mg Oral TID  . [START ON 04/26/2019] enoxaparin (LOVENOX) injection  40 mg Subcutaneous Q24H  . escitalopram  20 mg Oral Daily  . gabapentin  300 mg Oral TID  . losartan  12.5 mg Oral Daily  . metoprolol tartrate  25 mg Oral BID  . pantoprazole  40 mg Oral Daily  . QUEtiapine  25 mg Oral QHS  . rosuvastatin  40 mg Oral Daily  . sodium chloride flush  3 mL Intravenous Q12H  . sodium chloride flush  3 mL Intravenous Q12H  . sodium chloride flush  3 mL Intravenous Q12H  . sodium chloride flush  3 mL Intravenous Q12H  . ticagrelor  90 mg Oral BID   Continuous Infusions: . sodium chloride    . sodium chloride    . sodium chloride    . sodium chloride 1 mL/kg/hr (04/25/19 1133)  . nitroGLYCERIN Stopped (04/25/19 0403)   PRN Meds: sodium chloride, sodium chloride, sodium chloride, acetaminophen, albuterol, ALPRAZolam, nitroGLYCERIN, ondansetron (ZOFRAN) IV, oxyCODONE, sodium chloride flush, sodium chloride flush, sodium chloride flush, zolpidem   Vital Signs    Vitals:   04/25/19 1121 04/25/19 1136 04/25/19 1151 04/25/19 1449  BP: (!) 142/81 (!) 152/72 138/81 108/74  Pulse:      Resp: (!) 27 20 (!) 22 20  Temp:    (!) 97.5 F (36.4 C)  TempSrc:    Axillary  SpO2: 98% 100% 100% 99%  Weight:      Height:        Intake/Output Summary (Last 24 hours) at 04/25/2019 1745 Last data filed at 04/25/2019 1636 Gross per 24 hour  Intake 600 ml  Output 1650 ml  Net -1050 ml   Last 3 Weights 04/25/2019 04/24/2019 04/24/2019  Weight (lbs) 181 lb 14.4 oz 180 lb 4.8 oz 179 lb 8 oz  Weight (kg) 82.509 kg 81.784 kg 81.421 kg  Some encounter  information is confidential and restricted. Go to Review Flowsheets activity to see all data.      Telemetry    NSR - Personally Reviewed  ECG    NSR, lateral T wave inversions - Personally Reviewed  Physical Exam   GEN: No acute distress.   Neck: No JVD Cardiac: RRR, no murmurs, rubs, or gallops.  Respiratory: Clear to auscultation bilaterally. GI: Soft, nontender, non-distended  MS: No edema; No deformity. Right groin site intact.  No hematoma. Neuro:  Nonfocal  Psych: Normal affect   Labs    High Sensitivity Troponin:   Recent Labs  Lab 04/24/19 0919 04/24/19 1333 04/24/19 1841 04/24/19 2255  TROPONINIHS 3,582* 4,127* 3,418* 3,106*      Chemistry Recent Labs  Lab 04/24/19 0919 04/24/19 1841 04/25/19 0628  NA 140  --  139  K 4.4  --  4.8  CL 106  --  108  CO2 25  --  20*  GLUCOSE 100*  --  92  BUN 7  --  5*  CREATININE 0.81  --  0.83  CALCIUM 9.2  --  8.7*  PROT  --  7.0  --  ALBUMIN  --  3.6  --   AST  --  40  --   ALT  --  17  --   ALKPHOS  --  68  --   BILITOT  --  0.6  --   GFRNONAA >60  --  >60  GFRAA >60  --  >60  ANIONGAP 9  --  11     Hematology Recent Labs  Lab 04/24/19 0919 04/25/19 0628  WBC 10.5 9.8  RBC 4.64 4.60  HGB 12.2 11.7*  HCT 40.6 38.9  MCV 87.5 84.6  MCH 26.3 25.4*  MCHC 30.0 30.1  RDW 16.8* 17.0*  PLT 283 304    BNPNo results for input(s): BNP, PROBNP in the last 168 hours.   DDimer No results for input(s): DDIMER in the last 168 hours.   Radiology    DG Chest 2 View  Result Date: 04/24/2019 CLINICAL DATA:  Central chest pain since last night radiating to LEFT arm and jaw, coronary artery disease post CABG EXAM: CHEST - 2 VIEW COMPARISON:  04/06/2019 FINDINGS: Upper normal size of cardiac silhouette post CABG. Mediastinal contours and pulmonary vascularity normal. Lungs clear. No pulmonary infiltrate, pleural effusion or pneumothorax. Osseous structures unremarkable. IMPRESSION: No acute abnormalities.  Electronically Signed   By: Lavonia Dana M.D.   On: 04/24/2019 10:02   CARDIAC CATHETERIZATION  Result Date: 04/25/2019  Prox LAD lesion is 50% stenosed.  1st Diag lesion is 80% stenosed.  Dist LAD-2 lesion is 70% stenosed.  Dist LAD-1 lesion is 95% stenosed.  1st Mrg lesion is 50% stenosed.  3rd Mrg lesion is 80% stenosed.  Mid Cx to Dist Cx lesion is 90% stenosed.  Dist Cx lesion is 100% stenosed.  Ost RCA to Prox RCA lesion is 5% stenosed.  RPDA lesion is 95% stenosed.  Dist RCA lesion is 100% stenosed.  Mid RCA-1 lesion is 80% stenosed.  Prox RCA lesion is 70% stenosed.  Mid RCA-2 lesion is 90% stenosed.  Mid RCA to Dist RCA lesion is 40% stenosed.  Origin lesion is 100% stenosed.  And is small.  Origin lesion is 100% stenosed.  Dist Graft to Insertion lesion is 95% stenosed.  Mid LAD-1 lesion is 50% stenosed.  Post intervention, there is a 0% residual stenosis.  Mid LAD-2 lesion is 40% stenosed.  Dist LAD-3 lesion is 40% stenosed.  A drug-eluting stent was successfully placed using a STENT RESOLUTE ONYX 2.25X22.  Post intervention, there is a 0% residual stenosis.  Post intervention, there is a 0% residual stenosis.  A drug-eluting stent was successfully placed using a STENT RESOLUTE ONYX 3.0X12.  1.  Occluded SVG to OM and SVG to RCA. 2.  Successful angioplasty and 2 drug-eluting stent placement to the proximal and mid/distal LAD.  Very difficult procedure due to significant mid vessel tortuosity that caused obstructive flow every time I advanced the wire. Initially, I started working on the LIMA to LAD but the vessel was extremely tortuous.  There was loss of flow every time I advanced the wire to the LAD due to pseudolesions.  Thus, I elected to switch to treating the native vessel. Recommendations: This was a very difficult procedure.  I exceeded contrast limit and thus did not proceed with RCA PCI. In addition, sedating the patient was almost impossible and required 7 mg of  Versed and 75 mcg of fentanyl.  General anesthesia might need to be required with next PCI. Continue dual antiplatelet therapy for at least 1 year and  preferably indefinitely. Staged RCA PCI can be considered in the outpatient setting or inpatient if the patient continues to have chest pain.   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  Patent very small diameter saphenous vein graft to the first diagonal.  Patent LIMA to the LAD but with distal anastomosis 90% stenosis in the very distal LIMA and beyond the graft insertion site in the LAD there is 99% stenosis that is also focal.  Left main is widely patent  LAD is patent into the mid vessel where there is competitive flow with the LIMA.  The first diagonal is patent with competitive flow from the bypass graft.  The second obtuse marginal of the circumflex is totally occluded in its mid segment and tiny distal bifurcation branches are seen to fill by left to right collaterals.  This appearance strongly suggest that the saphenous vein graft to the marginal is totally occluded.  Right coronary is severely and diffusely diseased involving diffuse mild in-stent restenosis in the proximal stent, tandem 80% distal stenoses before the origin of the PDA, 60 to 70% stenosis in the proximal PDA, and total occlusion of the continuation of the right coronary before what used to be a large left ventricular branch.  We were unable to demonstrate patency of the graft to the LV branch despite our best effort and multiple catheters.  Overall normal LV function.  Possible mild inferobasal hypokinesis.  EF 70%.  LVEDP is normal. RECOMMENDATIONS:  Difficult situation in this young female.  Unfortunately the left internal mammary distal anastomosis and the native vessel beyond the graft anastomosis is severely stenotic.  Antegrade  approach for PCI would be complicated due to LIMA tortuosity.  Perhaps an antegrade LAD approach could be attempted with transient balloon occlusion of the LIMA to allow visualization of antegrade flow.  I am not absolutely sure that the obtuse marginal graft and the PL graft are totally occluded but significant time was spent attempting to visualize these grafts and there was no evidence of flow.  Oddly, I was never able to confidently feel that I was in a stop for either of these grafts.  Management strategy will need full attention of the interventional team with reference to ideas concerning percutaneous revascularization.  I do not think that she would be a candidate for repeat surgery.   Cardiac Studies   I personally reviewed the cath films and discussed the case with Dr. Kirke Corin.    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  Flow was compromised when the tortuous LIMA was straightened with a wire.  Treat medically.  If angina persists, could bring back for PCI of RCA. I suggested that shwe would be ready for discharge tomorrow, but she was not convinced and feels she may want to stay longer.  SHe also needs a note to be excused from jury duty which was today.    For questions or updates, please contact [facility] Please consult [email or web address] for contact info under        Signed, Lance Muss, MD  04/25/2019, 5:45 PM

## 2019-04-25 NOTE — Interval H&P Note (Signed)
Cath Lab Visit (complete for each Cath Lab visit)  Clinical Evaluation Leading to the Procedure:   ACS: Yes.    Non-ACS:  n/a    History and Physical Interval Note:  04/25/2019 8:54 AM  [name]  has presented today for surgery, with the diagnosis of cad.  The various methods of treatment have been discussed with the patient and family. After consideration of risks, benefits and other options for treatment, the patient has consented to  Procedure(s): CORONARY STENT INTERVENTION (N/A) as a surgical intervention.  The patient's history has been reviewed, patient examined, no change in status, stable for surgery.  I have reviewed the patient's chart and labs.  Questions were answered to the patient's satisfaction.     Lorine Bears

## 2019-04-26 ENCOUNTER — Ambulatory Visit: Payer: Self-pay | Admitting: Interventional Cardiology

## 2019-04-26 LAB — CBC
HCT: 31.8 % — ABNORMAL LOW (ref 36.0–46.0)
Hemoglobin: 10 g/dL — ABNORMAL LOW (ref 12.0–15.0)
MCH: 26.8 pg (ref 26.0–34.0)
MCHC: 31.4 g/dL (ref 30.0–36.0)
MCV: 85.3 fL (ref 80.0–100.0)
Platelets: 264 10*3/uL (ref 150–400)
RBC: 3.73 MIL/uL — ABNORMAL LOW (ref 3.87–5.11)
RDW: 16.9 % — ABNORMAL HIGH (ref 11.5–15.5)
WBC: 14.7 10*3/uL — ABNORMAL HIGH (ref 4.0–10.5)
nRBC: 0 % (ref 0.0–0.2)

## 2019-04-26 LAB — BASIC METABOLIC PANEL
Anion gap: 12 (ref 5–15)
BUN: 5 mg/dL — ABNORMAL LOW (ref 6–20)
CO2: 21 mmol/L — ABNORMAL LOW (ref 22–32)
Calcium: 8.7 mg/dL — ABNORMAL LOW (ref 8.9–10.3)
Chloride: 108 mmol/L (ref 98–111)
Creatinine, Ser: 0.83 mg/dL (ref 0.44–1.00)
GFR calc Af Amer: >60 mL/min (ref >60)
GFR calc non Af Amer: >60 mL/min (ref >60)
Glucose, Bld: 102 mg/dL — ABNORMAL HIGH (ref 70–99)
Potassium: 3.9 mmol/L (ref 3.5–5.1)
Sodium: 141 mmol/L (ref 135–145)

## 2019-04-26 MED ORDER — EZETIMIBE 10 MG PO TABS
10.0000 mg | ORAL_TABLET | Freq: Every day | ORAL | Status: DC
  Administered 2019-04-26 – 2019-04-29 (×4): 10 mg via ORAL
  Filled 2019-04-26 (×4): qty 1

## 2019-04-26 MED ORDER — ISOSORBIDE MONONITRATE ER 30 MG PO TB24
30.0000 mg | ORAL_TABLET | Freq: Every day | ORAL | Status: DC
  Administered 2019-04-26 – 2019-04-29 (×4): 30 mg via ORAL
  Filled 2019-04-26 (×4): qty 1

## 2019-04-26 MED ORDER — LORAZEPAM 0.5 MG PO TABS
0.5000 mg | ORAL_TABLET | Freq: Three times a day (TID) | ORAL | Status: DC | PRN
  Administered 2019-04-26 – 2019-04-29 (×8): 0.5 mg via ORAL
  Filled 2019-04-26 (×8): qty 1

## 2019-04-26 MED ORDER — ALUM & MAG HYDROXIDE-SIMETH 200-200-20 MG/5ML PO SUSP: 15.0000 mL | Freq: Four times a day (QID) | ORAL | Status: DC | PRN

## 2019-04-26 MED FILL — Lidocaine HCl Local Preservative Free (PF) Inj 1%: INTRAMUSCULAR | Qty: 30 | Status: AC

## 2019-04-26 NOTE — Telephone Encounter (Signed)
Another referral was place for pt, will close this referral.

## 2019-04-26 NOTE — Progress Notes (Signed)
CARDIAC REHAB PHASE I   PRE:  Rate/Rhythm: 63 SR    BP: sitting 110/62    SaO2:   MODE:  Ambulation: 400 ft   POST:  Rate/Rhythm: 88 SR    BP: sitting 127/61     SaO2:   Ambulated at quick pace. Began having left chest ache on return trip. Resolved with rest. Began ed, has many questions. Explained arteries and need to ambulate slower next time to not cause angina. Gave her heart healthy diet as she was not watching her diet PTA. She was able to quit smoking and drinking, congratulated her. She was also walking on TM. Will f/u tomorrow for rest of education. She is tangential. [id number]   [name] CES, ACSM 04/26/2019 3:00 PM
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
## 2019-04-27 ENCOUNTER — Encounter (HOSPITAL_COMMUNITY)
Admission: EM | Disposition: A | Discharge status: Home / Self Care | Payer: Self-pay | Source: Home / Self Care | Attending: Cardiology

## 2019-04-27 HISTORY — PX: CORONARY STENT INTERVENTION: CATH118234

## 2019-04-27 LAB — CBC
HCT: 32.7 % — ABNORMAL LOW (ref 36.0–46.0)
Hemoglobin: 9.9 g/dL — ABNORMAL LOW (ref 12.0–15.0)
MCH: 26.3 pg (ref 26.0–34.0)
MCHC: 30.3 g/dL (ref 30.0–36.0)
MCV: 86.7 fL (ref 80.0–100.0)
Platelets: 244 10*3/uL (ref 150–400)
RBC: 3.77 MIL/uL — ABNORMAL LOW (ref 3.87–5.11)
RDW: 17 % — ABNORMAL HIGH (ref 11.5–15.5)
WBC: 12.3 10*3/uL — ABNORMAL HIGH (ref 4.0–10.5)
nRBC: 0 % (ref 0.0–0.2)

## 2019-04-27 LAB — BASIC METABOLIC PANEL
Anion gap: 10 (ref 5–15)
BUN: 6 mg/dL (ref 6–20)
CO2: 22 mmol/L (ref 22–32)
Calcium: 8.4 mg/dL — ABNORMAL LOW (ref 8.9–10.3)
Chloride: 104 mmol/L (ref 98–111)
Creatinine, Ser: 0.93 mg/dL (ref 0.44–1.00)
GFR calc Af Amer: >60 mL/min (ref >60)
GFR calc non Af Amer: >60 mL/min (ref >60)
Glucose, Bld: 114 mg/dL — ABNORMAL HIGH (ref 70–99)
Potassium: 3.8 mmol/L (ref 3.5–5.1)
Sodium: 136 mmol/L (ref 135–145)

## 2019-04-27 LAB — POCT ACTIVATED CLOTTING TIME: Activated Clotting Time: 301 seconds

## 2019-04-27 SURGERY — CORONARY STENT INTERVENTION
Anesthesia: LOCAL

## 2019-04-27 MED ORDER — SODIUM CHLORIDE 0.9% FLUSH: 3.0000 mL | INTRAVENOUS | Status: DC | PRN

## 2019-04-27 MED ORDER — VERAPAMIL HCL 2.5 MG/ML IV SOLN
INTRAVENOUS | Status: AC
  Filled 2019-04-27: qty 2

## 2019-04-27 MED ORDER — BIVALIRUDIN BOLUS VIA INFUSION - CUPID
INTRAVENOUS | Status: DC | PRN
  Administered 2019-04-27: 63.45 mg via INTRAVENOUS

## 2019-04-27 MED ORDER — SODIUM CHLORIDE 0.9 % IV SOLN: 250.0000 mL | INTRAVENOUS | Status: DC | PRN

## 2019-04-27 MED ORDER — MIDAZOLAM HCL 2 MG/2ML IJ SOLN
INTRAMUSCULAR | Status: DC | PRN
  Administered 2019-04-27: 1 mg via INTRAVENOUS
  Administered 2019-04-27: 2 mg via INTRAVENOUS
  Administered 2019-04-27: 3 mg via INTRAVENOUS

## 2019-04-27 MED ORDER — MIDAZOLAM HCL 2 MG/2ML IJ SOLN
INTRAMUSCULAR | Status: AC
  Filled 2019-04-27: qty 2

## 2019-04-27 MED ORDER — NITROGLYCERIN 1 MG/10 ML FOR IR/CATH LAB
INTRA_ARTERIAL | Status: DC | PRN
  Administered 2019-04-27: 150 ug via INTRACORONARY

## 2019-04-27 MED ORDER — IOHEXOL 350 MG/ML SOLN
INTRAVENOUS | Status: DC | PRN
  Administered 2019-04-27: 65 mL via INTRACARDIAC

## 2019-04-27 MED ORDER — BUPIVACAINE HCL (PF) 0.25 % IJ SOLN
INTRAMUSCULAR | Status: AC
  Filled 2019-04-27: qty 30

## 2019-04-27 MED ORDER — BUPIVACAINE HCL (PF) 0.25 % IJ SOLN
INTRAMUSCULAR | Status: DC | PRN
  Administered 2019-04-27: 15 mL

## 2019-04-27 MED ORDER — SODIUM CHLORIDE 0.9% FLUSH
3.0000 mL | Freq: Two times a day (BID) | INTRAVENOUS | Status: DC
  Administered 2019-04-28 (×2): 3 mL via INTRAVENOUS

## 2019-04-27 MED ORDER — SODIUM CHLORIDE 0.9 % WEIGHT BASED INFUSION
1.0000 mL/kg/h | INTRAVENOUS | Status: DC
  Administered 2019-04-27: 13:00:00 1 mL/kg/h via INTRAVENOUS

## 2019-04-27 MED ORDER — ONDANSETRON HCL 4 MG/2ML IJ SOLN: 4.0000 mg | Freq: Four times a day (QID) | INTRAMUSCULAR | Status: DC | PRN

## 2019-04-27 MED ORDER — FENTANYL CITRATE (PF) 100 MCG/2ML IJ SOLN
INTRAMUSCULAR | Status: DC | PRN
  Administered 2019-04-27 (×3): 50 ug via INTRAVENOUS

## 2019-04-27 MED ORDER — LIDOCAINE HCL (PF) 1 % IJ SOLN
INTRAMUSCULAR | Status: AC
  Filled 2019-04-27: qty 30

## 2019-04-27 MED ORDER — HEPARIN (PORCINE) IN NACL 1000-0.9 UT/500ML-% IV SOLN
INTRAVENOUS | Status: DC | PRN
  Administered 2019-04-27 (×2): 500 mL

## 2019-04-27 MED ORDER — HEPARIN (PORCINE) IN NACL 1000-0.9 UT/500ML-% IV SOLN
INTRAVENOUS | Status: AC
  Filled 2019-04-27: qty 1000

## 2019-04-27 MED ORDER — ASPIRIN 81 MG PO CHEW: 81.0000 mg | CHEWABLE_TABLET | ORAL | Status: DC

## 2019-04-27 MED ORDER — TICAGRELOR 90 MG PO TABS: 90.0000 mg | ORAL_TABLET | Freq: Two times a day (BID) | ORAL | 11 refills | Status: DC

## 2019-04-27 MED ORDER — FENTANYL CITRATE (PF) 100 MCG/2ML IJ SOLN
INTRAMUSCULAR | Status: AC
  Filled 2019-04-27: qty 2

## 2019-04-27 MED ORDER — HEPARIN SODIUM (PORCINE) 1000 UNIT/ML IJ SOLN
INTRAMUSCULAR | Status: AC
  Filled 2019-04-27: qty 1

## 2019-04-27 MED ORDER — SODIUM CHLORIDE 0.9 % IV SOLN: INTRAVENOUS | Status: DC | PRN

## 2019-04-27 MED ORDER — HYDRALAZINE HCL 20 MG/ML IJ SOLN: 10.0000 mg | INTRAMUSCULAR | Status: AC | PRN

## 2019-04-27 MED ORDER — SODIUM CHLORIDE 0.9 % WEIGHT BASED INFUSION
1.0000 mL/kg/h | INTRAVENOUS | Status: AC
  Administered 2019-04-27: 19:00:00 1 mL/kg/h via INTRAVENOUS

## 2019-04-27 MED ORDER — SODIUM CHLORIDE 0.9 % WEIGHT BASED INFUSION
3.0000 mL/kg/h | INTRAVENOUS | Status: AC
  Administered 2019-04-27: 13:00:00 3 mL/kg/h via INTRAVENOUS

## 2019-04-27 MED ORDER — BIVALIRUDIN TRIFLUOROACETATE 250 MG IV SOLR
INTRAVENOUS | Status: AC
  Filled 2019-04-27: qty 250

## 2019-04-27 MED ORDER — NITROGLYCERIN 1 MG/10 ML FOR IR/CATH LAB
INTRA_ARTERIAL | Status: AC
  Filled 2019-04-27: qty 10

## 2019-04-27 MED ORDER — IOHEXOL 350 MG/ML SOLN
INTRAVENOUS | Status: AC
  Filled 2019-04-27: qty 1

## 2019-04-27 MED ORDER — SODIUM CHLORIDE 0.9% FLUSH
3.0000 mL | Freq: Two times a day (BID) | INTRAVENOUS | Status: DC
  Administered 2019-04-27 – 2019-04-28 (×2): 3 mL via INTRAVENOUS

## 2019-04-27 MED ORDER — SODIUM CHLORIDE 0.9 % IV SOLN
INTRAVENOUS | Status: AC | PRN
  Administered 2019-04-27: 1.75 mg/kg/h via INTRAVENOUS

## 2019-04-27 MED ORDER — LABETALOL HCL 5 MG/ML IV SOLN: 10.0000 mg | INTRAVENOUS | Status: AC | PRN

## 2019-04-27 MED FILL — BRILINTA 90 MG TABLET: 90 | 30 days supply | Qty: 60 | Fill #0

## 2019-04-27 SURGICAL SUPPLY — 15 items
BALLN SAPPHIRE 2.5X15 (BALLOONS) ×2
BALLN SAPPHIRE ~~LOC~~ 2.75X15 (BALLOONS) ×1 IMPLANT
BALLOON SAPPHIRE 2.5X15 (BALLOONS) IMPLANT
CATH VISTA GUIDE 6FR JR4 (CATHETERS) ×1 IMPLANT
DEVICE CLOSURE PERCLS PRGLD 6F (VASCULAR PRODUCTS) IMPLANT
KIT ENCORE 26 ADVANTAGE (KITS) ×1 IMPLANT
KIT HEART LEFT (KITS) ×2 IMPLANT
PACK CARDIAC CATHETERIZATION (CUSTOM PROCEDURE TRAY) ×2 IMPLANT
PERCLOSE PROGLIDE 6F (VASCULAR PRODUCTS) ×2
SHEATH PINNACLE 6F 10CM (SHEATH) ×1 IMPLANT
SHEATH PROBE COVER 6X72 (BAG) ×1 IMPLANT
STENT RESOLUTE ONYX 2.75X38 (Permanent Stent) ×1 IMPLANT
TRANSDUCER W/STOPCOCK (MISCELLANEOUS) ×2 IMPLANT
TUBING CIL FLEX 10 FLL-RA (TUBING) ×2 IMPLANT
WIRE COUGAR XT STRL 190CM (WIRE) ×1 IMPLANT

## 2019-04-27 NOTE — H&P (View-Only) (Signed)
Progress Note  Patient Name: [name] Date of Encounter: 04/27/2019  Primary Cardiologist: Lesleigh Noe, MD   Subjective   Pt reports symptoms are more controlled after initiation of Imdur. States she would rather stay for intervention next week. Dr. Katrinka Blazing appointment was cancelled for yesterday. Denies current chest pain, SOB  Inpatient Medications    Scheduled Meds: . amLODipine  5 mg Oral Daily  . aspirin  81 mg Oral Daily  . atomoxetine  40 mg Oral q morning - 10a  . busPIRone  10 mg Oral TID  . enoxaparin (LOVENOX) injection  40 mg Subcutaneous Q24H  . escitalopram  20 mg Oral Daily  . ezetimibe  10 mg Oral Daily  . gabapentin  300 mg Oral TID  . isosorbide mononitrate  30 mg Oral Daily  . losartan  12.5 mg Oral Daily  . metoprolol tartrate  25 mg Oral BID  . pantoprazole  40 mg Oral Daily  . QUEtiapine  25 mg Oral QHS  . rosuvastatin  40 mg Oral Daily  . sodium chloride flush  3 mL Intravenous Q12H  . sodium chloride flush  3 mL Intravenous Q12H  . sodium chloride flush  3 mL Intravenous Q12H  . sodium chloride flush  3 mL Intravenous Q12H  . ticagrelor  90 mg Oral BID   Continuous Infusions: . sodium chloride    . sodium chloride    . sodium chloride    . nitroGLYCERIN Stopped (04/25/19 0403)   PRN Meds: sodium chloride, sodium chloride, sodium chloride, acetaminophen, albuterol, alum & mag hydroxide-simeth, LORazepam, nitroGLYCERIN, ondansetron (ZOFRAN) IV, oxyCODONE, sodium chloride flush, sodium chloride flush, sodium chloride flush, zolpidem   Vital Signs    Vitals:   04/26/19 1506 04/26/19 2050 04/26/19 2326 04/27/19 0644  BP: 113/62 (!) 109/52  113/62  Pulse: 65 73  68  Resp: Temp: 98.7 F (37.1 C) 100.1 F (37.8 C)  98.1 F (36.7 C)  TempSrc: Oral Oral  Oral  SpO2: 98% 98% 98% 95%  Weight:      Height:        Intake/Output Summary (Last 24 hours) at 04/27/2019 0701 Last data filed at 04/27/2019 0651 Gross per 24 hour   Intake 1403 ml  Output --  Net 1403 ml   Filed Weights   04/24/19 1712 04/25/19 0650 04/26/19 0600  Weight: 81.8 kg 82.5 kg 67.5 kg    Physical Exam   General: Well developed, well nourished, NAD Skin: Warm, dry, intact  Head: Normocephalic, atraumatic, sclera non-icteric, no xanthomas, clear, moist mucus membranes. Neck: Negative for carotid bruits. No JVD Lungs:Clear to ausculation bilaterally. No wheezes, rales, or rhonchi. Breathing is unlabored. Cardiovascular: RRR with S1 S2. No murmurs, rubs, gallops, or LV heave appreciated. Extremities: No edema. Radial/DP/PT pulses 2+ bilaterally; cath site stable  Neuro: Alert and oriented. No focal deficits. No facial asymmetry. MAE spontaneously. Psych: Responds to questions appropriately with normal affect.    Labs    Chemistry Recent Labs  Lab 04/24/19 0919 04/24/19 1841 04/25/19 0628 04/26/19 0404  NA 140  --  139 141  K 4.4  --  4.8 3.9  CL 106  --  108 108  CO2 25  --  20* 21*  GLUCOSE 100*  --  92 102*  BUN 7  --  5* 5*  CREATININE 0.81  --  0.83 0.83  CALCIUM 9.2  --  8.7* 8.7*  PROT  --  7.0  --   --   ALBUMIN  --  3.6  --   --   AST  --  40  --   --   ALT  --  17  --   --   ALKPHOS  --  68  --   --   BILITOT  --  0.6  --   --   GFRNONAA >60  --  >60 >60  GFRAA >60  --  >60 >60  ANIONGAP 9  --  11 12     Hematology Recent Labs  Lab 04/25/19 0628 04/26/19 0404 04/27/19 0205  WBC 9.8 14.7* 12.3*  RBC 4.60 3.73* 3.77*  HGB 11.7* 10.0* 9.9*  HCT 38.9 31.8* 32.7*  MCV 84.6 85.3 86.7  MCH 25.4* 26.8 26.3  MCHC 30.1 31.4 30.3  RDW 17.0* 16.9* 17.0*  PLT 304 264 244    Cardiac EnzymesNo results for input(s): TROPONINI in the last 168 hours. No results for input(s): TROPIPOC in the last 168 hours.   BNPNo results for input(s): BNP, PROBNP in the last 168 hours.   DDimer No results for input(s): DDIMER in the last 168 hours.   Radiology    CARDIAC CATHETERIZATION  Result Date: 04/25/2019  Prox  LAD lesion is 50% stenosed.  1st Diag lesion is 80% stenosed.  Dist LAD-2 lesion is 70% stenosed.  Dist LAD-1 lesion is 95% stenosed.  1st Mrg lesion is 50% stenosed.  3rd Mrg lesion is 80% stenosed.  Mid Cx to Dist Cx lesion is 90% stenosed.  Dist Cx lesion is 100% stenosed.  Ost RCA to Prox RCA lesion is 5% stenosed.  RPDA lesion is 95% stenosed.  Dist RCA lesion is 100% stenosed.  Mid RCA-1 lesion is 80% stenosed.  Prox RCA lesion is 70% stenosed.  Mid RCA-2 lesion is 90% stenosed.  Mid RCA to Dist RCA lesion is 40% stenosed.  Origin lesion is 100% stenosed.  And is small.  Origin lesion is 100% stenosed.  Dist Graft to Insertion lesion is 95% stenosed.  Mid LAD-1 lesion is 50% stenosed.  Post intervention, there is a 0% residual stenosis.  Mid LAD-2 lesion is 40% stenosed.  Dist LAD-3 lesion is 40% stenosed.  A drug-eluting stent was successfully placed using a STENT RESOLUTE ONYX 2.25X22.  Post intervention, there is a 0% residual stenosis.  Post intervention, there is a 0% residual stenosis.  A drug-eluting stent was successfully placed using a STENT RESOLUTE ONYX 3.0X12.  1.  Occluded SVG to OM and SVG to RCA. 2.  Successful angioplasty and 2 drug-eluting stent placement to the proximal and mid/distal LAD.  Very difficult procedure due to significant mid vessel tortuosity that caused obstructive flow every time I advanced the wire. Initially, I started working on the LIMA to LAD but the vessel was extremely tortuous.  There was loss of flow every time I advanced the wire to the LAD due to pseudolesions.  Thus, I elected to switch to treating the native vessel. Recommendations: This was a very difficult procedure.  I exceeded contrast limit and thus did not proceed with RCA PCI. In addition, sedating the patient was almost impossible and required 7 mg of Versed and 75 mcg of fentanyl.  General anesthesia might need to be required with next PCI. Continue dual antiplatelet therapy for  at least 1 year and preferably indefinitely. Staged RCA PCI can be considered in the outpatient setting or inpatient if the patient continues to have chest pain.  ECHOCARDIOGRAM COMPLETE  Result Date: 04/25/2019    ECHOCARDIOGRAM REPORT   Patient Name:   [name] Date of Exam: 04/25/2019 Medical Rec #:  [id number]    Height:       61.0 in Accession #:    [id number]   Weight:       181.9 lb Date of Birth:  [birth year]    BSA:          1.814 m Patient Age:    53 years     BP:           138/81 mmHg Patient Gender: F            HR:           67 bpm. Exam Location:  Inpatient Procedure: 2D Echo Indications:    NSTEMI I21.4  History:        Patient has prior history of Echocardiogram examinations, most                 recent 01/18/2019. CHF, CAD and Previous Myocardial Infarction,                 Prior CABG; Risk Factors:Hypertension, Dyslipidemia, Sleep Apnea                 and Former Smoker. Anemia. GERD. 04/25/19 two stents placed in                 the mid/distal LAD.  Sonographer:    Leta Jungling RDCS Referring Phys: [id number] RHONDA G BARRETT IMPRESSIONS  1. Left ventricular ejection fraction, by estimation, is 60 to 65%. The left ventricle has normal global function. Basal inferior hypokinesis. There is mild left ventricular hypertrophy. Left ventricular diastolic parameters were normal.  2. Right ventricular systolic function is normal. The right ventricular size is normal. There is normal pulmonary artery systolic pressure.  3. The mitral valve is normal in structure. No evidence of mitral valve regurgitation.  4. The aortic valve was not well visualized. Aortic valve regurgitation is not visualized. No aortic stenosis is present. FINDINGS  Left Ventricle: Left ventricular ejection fraction, by estimation, is 60 to 65%. The left ventricle has normal function. The left ventricle demonstrates regional wall motion abnormalities. The left ventricular internal cavity size was normal in size. There is mild left  ventricular hypertrophy. Left ventricular diastolic parameters were normal. Right Ventricle: The right ventricular size is normal. No increase in right ventricular wall thickness. Right ventricular systolic function is normal. There is normal pulmonary artery systolic pressure. The tricuspid regurgitant velocity is 2.30 m/s, and  with an assumed right atrial pressure of 3 mmHg, the estimated right ventricular systolic pressure is 24.2 mmHg. Left Atrium: Left atrial size was normal in size. Right Atrium: Right atrial size was normal in size. Pericardium: Trivial pericardial effusion is present. Mitral Valve: The mitral valve is normal in structure. No evidence of mitral valve regurgitation. Tricuspid Valve: The tricuspid valve is normal in structure. Tricuspid valve regurgitation is trivial. Aortic Valve: The aortic valve was not well visualized. Aortic valve regurgitation is not visualized. No aortic stenosis is present. Pulmonic Valve: The pulmonic valve was not well visualized. Pulmonic valve regurgitation is not visualized. Aorta: The aortic root and ascending aorta are structurally normal, with no evidence of dilitation. IAS/Shunts: The interatrial septum was not well visualized.  LEFT VENTRICLE PLAX 2D LVIDd:         3.35 cm      Diastology LVIDs:  2.45 cm      LV e' lateral:   13.10 cm/s LV PW:         0.95 cm      LV E/e' lateral: 8.2 LV IVS:        1.15 cm      LV e' medial:    8.49 cm/s LVOT diam:     2.00 cm      LV E/e' medial:  12.7 LV SV:         59 LV SV Index:   32 LVOT Area:     3.14 cm  LV Volumes (MOD) LV vol d, MOD A2C: 109.0 ml LV vol d, MOD A4C: 116.0 ml LV vol s, MOD A2C: 49.5 ml LV vol s, MOD A4C: 51.0 ml LV SV MOD A2C:     59.5 ml LV SV MOD A4C:     116.0 ml LV SV MOD BP:      63.3 ml LEFT ATRIUM             Index       RIGHT ATRIUM           Index LA diam:        3.80 cm 2.09 cm/m  RA Area:     16.00 cm LA Vol (A2C):   44.9 ml 24.75 ml/m RA Volume:   40.20 ml  22.16 ml/m LA Vol  (A4C):   44.0 ml 24.25 ml/m LA Biplane Vol: 46.4 ml 25.58 ml/m  AORTIC VALVE LVOT Vmax:   102.00 cm/s LVOT Vmean:  78.100 cm/s LVOT VTI:    0.187 m  AORTA Ao Root diam: 2.40 cm Ao Asc diam:  2.60 cm MITRAL VALVE                TRICUSPID VALVE MV Area (PHT): 4.49 cm     TR Peak grad:   21.2 mmHg MV Decel Time: 169 msec     TR Vmax:        230.00 cm/s MV E velocity: 108.00 cm/s MV A velocity: 88.80 cm/s   SHUNTS MV E/A ratio:  1.22         Systemic VTI:  0.19 m                             Systemic Diam: 2.00 cm Epifanio Lesches MD Electronically signed by Epifanio Lesches MD Signature Date/Time: 04/25/2019/7:50:41 PM    Final    Telemetry    04/27/19 NSR HR 80-90's - Personally Reviewed  ECG    No new tracing as of 04/27/2019- Personally Reviewed  Cardiac Studies   Cath: 04/24/19   Bypass graft failure with total occlusion of the SVG to the obtuse marginal, and total occlusion of the SVG to the posterolateral branch.  Patent very small diameter saphenous vein graft to the first diagonal.  Patent LIMA to the LAD but with distal anastomosis 90% stenosis in the very distal LIMA and beyond the graft insertion site in the LAD there is 99% stenosis that is also focal.  Left main is widely patent  LAD is patent into the mid vessel where there is competitive flow with the LIMA. The first diagonal is patent with competitive flow from the bypass graft.  The second obtuse marginal of the circumflex is totally occluded in its mid segment and tiny distal bifurcation branches are seen to fill by left to right collaterals. This appearance strongly suggest that the saphenous  vein graft to the marginal is totally occluded.  Right coronary is severely and diffusely diseased involving diffuse mild in-stent restenosis in the proximal stent, tandem 80% distal stenoses before the origin of the PDA, 60 to 70% stenosis in the proximal PDA, and total occlusion of the continuation of the right coronary  before what used to be a large left ventricular branch. We were unable to demonstrate patency of the graft to the LV branch despite our best effort and multiple catheters.  Overall normal LV function. Possible mild inferobasal hypokinesis. EF 70%. LVEDP is normal.  RECOMMENDATIONS:   Difficult situation in this young female. Unfortunately the left internal mammary distal anastomosis and the native vessel beyond the graft anastomosis is severely stenotic. Antegrade approach for PCI would be complicated due to LIMA tortuosity. Perhaps an antegrade LAD approach could be attempted with transient balloon occlusion of the LIMA to allow visualization of antegrade flow.  I am not absolutely sure that the obtuse marginal graft and the PL graft are totally occluded but significant time was spent attempting to visualize these grafts and there was no evidence of flow. Oddly, I was never able to confidently feel that I was in a stop for either of these grafts.  Management strategy will need full attention of the interventional team with reference to ideas concerning percutaneous revascularization. I do not think that she would be a candidate for repeat surgery.  Diagnostic Dominance: Right  Cath: 04/25/19  1. Occluded SVG to OM and SVG to RCA. 2. Successful angioplasty and 2 drug-eluting stent placement to the proximal and mid/distal LAD. Very difficult procedure due to significant mid vessel tortuosity that caused obstructive flow every time I advanced the wire. Initially, I started working on the LIMA to LAD but the vessel was extremely tortuous. There was loss of flow every time I advanced the wire to the LAD due to pseudolesions. Thus, I elected to switch to treating the native vessel.  Recommendations: This was a very difficult procedure. I exceeded contrast limit and thus did not proceed with RCA PCI. In addition, sedating the patient was almost impossible and required 7 mg of  Versed and 75 mcg of fentanyl. General anesthesia might need to be required with next PCI. Continue dual antiplatelet therapy for at least 1 year and preferably indefinitely. Staged RCA PCI can be considered in the outpatient setting or inpatient if the patient continues to have chest pain.  Diagnostic Dominance: Right  Intervention     Patient Profile     54 y.o. 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 Cliffton AstersLightfoot with LIMA to LAD, SVG to PDA, SVG to OM3 and SVG to D1, HTN, HLD, GERD, tobacco abuse, alcohol abuse, and bipolar d/o with prior suicide attempt.  Assessment & Plan    1. CAD/NSTEMI: -Underwent cardiac cath 04/25/19 with Dr. Kirke CorinArida as noted above with successful but complex angioplasty with DESx2 to the m/dLAD. Unable to intervene on LIMA-LAD given tortuosity.  -Placed on DAPT with ASA/Brilinta for at least one year, likely indefinitely.  -Has known native RCA disease with occluded SVG-RPDA, unable to work on 04/25/19 as contrast time was exceeded the limit.   -Started on Imdur 3/18/2021with improved symptoms  -Continue ASA, Brilinta, beta-blocker, ARB and statin -Feels her anxiety is contributing>> Xanax transition to Ativan -Plan was for discharge today, for appointment with Dr. Katrinka BlazingSmith however it appears the appointment  was 04/26/19 not today therefore she has no close follow up with him planned at this time   2. HLD:  -Last LDL, 46  -Continue crestor 40mg  daily, LDL 46.  -Zetia added to her HLD regimen -Repeat lab work in 6 to 8 weeks  3. HTN:  -Stable, 113/62>109/52>113/62 -Continue current regimen   Signed, NP-[facility] Pager: [phone] 04/27/2019, 7:01 AM    I have examined the patient and reviewed assessment and plan and discussed with patient.  Agree with above as stated.  She has had more angina with walking.  She does not want to go home until she has  her second procedure to fix the RCA.  I again reassured her that the police will not come and arrest her since she missed her jury duty a couple of days ago as she was in the hospital.  She has a valid excuse.  We will make her n.p.o. at this point.  Her case will likely be done this afternoon.  Hopefully, things will go well and she will be agreeable to discharge tomorrow.  04/29/2019    For questions or updates, please contact   Please consult [email or web address] for contact info under Cardiology/STEMI.

## 2019-04-27 NOTE — Progress Notes (Addendum)
   Progress Note  Patient Name: [name] Date of Encounter: 04/27/2019  Primary Cardiologist: Henry W Smith III, MD   Subjective   Pt reports symptoms are more controlled after initiation of Imdur. States she would rather stay for intervention next week. Dr. Smith appointment was cancelled for yesterday. Denies current chest pain, SOB  Inpatient Medications    Scheduled Meds: . amLODipine  5 mg Oral Daily  . aspirin  81 mg Oral Daily  . atomoxetine  40 mg Oral q morning - 10a  . busPIRone  10 mg Oral TID  . enoxaparin (LOVENOX) injection  40 mg Subcutaneous Q24H  . escitalopram  20 mg Oral Daily  . ezetimibe  10 mg Oral Daily  . gabapentin  300 mg Oral TID  . isosorbide mononitrate  30 mg Oral Daily  . losartan  12.5 mg Oral Daily  . metoprolol tartrate  25 mg Oral BID  . pantoprazole  40 mg Oral Daily  . QUEtiapine  25 mg Oral QHS  . rosuvastatin  40 mg Oral Daily  . sodium chloride flush  3 mL Intravenous Q12H  . sodium chloride flush  3 mL Intravenous Q12H  . sodium chloride flush  3 mL Intravenous Q12H  . sodium chloride flush  3 mL Intravenous Q12H  . ticagrelor  90 mg Oral BID   Continuous Infusions: . sodium chloride    . sodium chloride    . sodium chloride    . nitroGLYCERIN Stopped (04/25/19 0403)   PRN Meds: sodium chloride, sodium chloride, sodium chloride, acetaminophen, albuterol, alum & mag hydroxide-simeth, LORazepam, nitroGLYCERIN, ondansetron (ZOFRAN) IV, oxyCODONE, sodium chloride flush, sodium chloride flush, sodium chloride flush, zolpidem   Vital Signs    Vitals:   04/26/19 1506 04/26/19 2050 04/26/19 2326 04/27/19 0644  BP: 113/62 (!) 109/52  113/62  Pulse: 65 73  68  Resp: 18 20  18  Temp: 98.7 F (37.1 C) 100.1 F (37.8 C)  98.1 F (36.7 C)  TempSrc: Oral Oral  Oral  SpO2: 98% 98% 98% 95%  Weight:      Height:        Intake/Output Summary (Last 24 hours) at 04/27/2019 0701 Last data filed at 04/27/2019 0651 Gross per 24 hour   Intake 1403 ml  Output --  Net 1403 ml   Filed Weights   04/24/19 1712 04/25/19 0650 04/26/19 0600  Weight: 81.8 kg 82.5 kg 67.5 kg    Physical Exam   General: Well developed, well nourished, NAD Skin: Warm, dry, intact  Head: Normocephalic, atraumatic, sclera non-icteric, no xanthomas, clear, moist mucus membranes. Neck: Negative for carotid bruits. No JVD Lungs:Clear to ausculation bilaterally. No wheezes, rales, or rhonchi. Breathing is unlabored. Cardiovascular: RRR with S1 S2. No murmurs, rubs, gallops, or LV heave appreciated. Extremities: No edema. Radial/DP/PT pulses 2+ bilaterally; cath site stable  Neuro: Alert and oriented. No focal deficits. No facial asymmetry. MAE spontaneously. Psych: Responds to questions appropriately with normal affect.    Labs    Chemistry Recent Labs  Lab 04/24/19 0919 04/24/19 1841 04/25/19 0628 04/26/19 0404  NA 140  --  139 141  K 4.4  --  4.8 3.9  CL 106  --  108 108  CO2 25  --  20* 21*  GLUCOSE 100*  --  92 102*  BUN 7  --  5* 5*  CREATININE 0.81  --  0.83 0.83  CALCIUM 9.2  --  8.7* 8.7*  PROT  --    7.0  --   --   ALBUMIN  --  3.6  --   --   AST  --  40  --   --   ALT  --  17  --   --   ALKPHOS  --  68  --   --   BILITOT  --  0.6  --   --   GFRNONAA >60  --  >60 >60  GFRAA >60  --  >60 >60  ANIONGAP 9  --  11 12     Hematology Recent Labs  Lab 04/25/19 0628 04/26/19 0404 04/27/19 0205  WBC 9.8 14.7* 12.3*  RBC 4.60 3.73* 3.77*  HGB 11.7* 10.0* 9.9*  HCT 38.9 31.8* 32.7*  MCV 84.6 85.3 86.7  MCH 25.4* 26.8 26.3  MCHC 30.1 31.4 30.3  RDW 17.0* 16.9* 17.0*  PLT 304 264 244    Cardiac EnzymesNo results for input(s): TROPONINI in the last 168 hours. No results for input(s): TROPIPOC in the last 168 hours.   BNPNo results for input(s): BNP, PROBNP in the last 168 hours.   DDimer No results for input(s): DDIMER in the last 168 hours.   Radiology    CARDIAC CATHETERIZATION  Result Date: 04/25/2019  Prox  LAD lesion is 50% stenosed.  1st Diag lesion is 80% stenosed.  Dist LAD-2 lesion is 70% stenosed.  Dist LAD-1 lesion is 95% stenosed.  1st Mrg lesion is 50% stenosed.  3rd Mrg lesion is 80% stenosed.  Mid Cx to Dist Cx lesion is 90% stenosed.  Dist Cx lesion is 100% stenosed.  Ost RCA to Prox RCA lesion is 5% stenosed.  RPDA lesion is 95% stenosed.  Dist RCA lesion is 100% stenosed.  Mid RCA-1 lesion is 80% stenosed.  Prox RCA lesion is 70% stenosed.  Mid RCA-2 lesion is 90% stenosed.  Mid RCA to Dist RCA lesion is 40% stenosed.  Origin lesion is 100% stenosed.  And is small.  Origin lesion is 100% stenosed.  Dist Graft to Insertion lesion is 95% stenosed.  Mid LAD-1 lesion is 50% stenosed.  Post intervention, there is a 0% residual stenosis.  Mid LAD-2 lesion is 40% stenosed.  Dist LAD-3 lesion is 40% stenosed.  A drug-eluting stent was successfully placed using a STENT RESOLUTE ONYX 2.25X22.  Post intervention, there is a 0% residual stenosis.  Post intervention, there is a 0% residual stenosis.  A drug-eluting stent was successfully placed using a STENT RESOLUTE ONYX 3.0X12.  1.  Occluded SVG to OM and SVG to RCA. 2.  Successful angioplasty and 2 drug-eluting stent placement to the proximal and mid/distal LAD.  Very difficult procedure due to significant mid vessel tortuosity that caused obstructive flow every time I advanced the wire. Initially, I started working on the LIMA to LAD but the vessel was extremely tortuous.  There was loss of flow every time I advanced the wire to the LAD due to pseudolesions.  Thus, I elected to switch to treating the native vessel. Recommendations: This was a very difficult procedure.  I exceeded contrast limit and thus did not proceed with RCA PCI. In addition, sedating the patient was almost impossible and required 7 mg of Versed and 75 mcg of fentanyl.  General anesthesia might need to be required with next PCI. Continue dual antiplatelet therapy for  at least 1 year and preferably indefinitely. Staged RCA PCI can be considered in the outpatient setting or inpatient if the patient continues to have chest pain.  ECHOCARDIOGRAM COMPLETE  Result Date: 04/25/2019    ECHOCARDIOGRAM REPORT   Patient Name:   Denora Heid Date of Exam: 04/25/2019 Medical Rec #:  [id number]    Height:       61.0 in Accession #:    [id number]   Weight:       181.9 lb Date of Birth:  [birth year]    BSA:          1.814 m Patient Age:    53 years     BP:           138/81 mmHg Patient Gender: F            HR:           67 bpm. Exam Location:  Inpatient Procedure: 2D Echo Indications:    NSTEMI I21.4  History:        Patient has prior history of Echocardiogram examinations, most                 recent 01/18/2019. CHF, CAD and Previous Myocardial Infarction,                 Prior CABG; Risk Factors:Hypertension, Dyslipidemia, Sleep Apnea                 and Former Smoker. Anemia. GERD. 04/25/19 two stents placed in                 the mid/distal LAD.  Sonographer:    Leta Jungling RDCS Referring Phys: [id number] RHONDA G BARRETT IMPRESSIONS  1. Left ventricular ejection fraction, by estimation, is 60 to 65%. The left ventricle has normal global function. Basal inferior hypokinesis. There is mild left ventricular hypertrophy. Left ventricular diastolic parameters were normal.  2. Right ventricular systolic function is normal. The right ventricular size is normal. There is normal pulmonary artery systolic pressure.  3. The mitral valve is normal in structure. No evidence of mitral valve regurgitation.  4. The aortic valve was not well visualized. Aortic valve regurgitation is not visualized. No aortic stenosis is present. FINDINGS  Left Ventricle: Left ventricular ejection fraction, by estimation, is 60 to 65%. The left ventricle has normal function. The left ventricle demonstrates regional wall motion abnormalities. The left ventricular internal cavity size was normal in size. There is mild left  ventricular hypertrophy. Left ventricular diastolic parameters were normal. Right Ventricle: The right ventricular size is normal. No increase in right ventricular wall thickness. Right ventricular systolic function is normal. There is normal pulmonary artery systolic pressure. The tricuspid regurgitant velocity is 2.30 m/s, and  with an assumed right atrial pressure of 3 mmHg, the estimated right ventricular systolic pressure is 24.2 mmHg. Left Atrium: Left atrial size was normal in size. Right Atrium: Right atrial size was normal in size. Pericardium: Trivial pericardial effusion is present. Mitral Valve: The mitral valve is normal in structure. No evidence of mitral valve regurgitation. Tricuspid Valve: The tricuspid valve is normal in structure. Tricuspid valve regurgitation is trivial. Aortic Valve: The aortic valve was not well visualized. Aortic valve regurgitation is not visualized. No aortic stenosis is present. Pulmonic Valve: The pulmonic valve was not well visualized. Pulmonic valve regurgitation is not visualized. Aorta: The aortic root and ascending aorta are structurally normal, with no evidence of dilitation. IAS/Shunts: The interatrial septum was not well visualized.  LEFT VENTRICLE PLAX 2D LVIDd:         3.35 cm      Diastology LVIDs:  2.45 cm      LV e' lateral:   13.10 cm/s LV PW:         0.95 cm      LV E/e' lateral: 8.2 LV IVS:        1.15 cm      LV e' medial:    8.49 cm/s LVOT diam:     2.00 cm      LV E/e' medial:  12.7 LV SV:         59 LV SV Index:   32 LVOT Area:     3.14 cm  LV Volumes (MOD) LV vol d, MOD A2C: 109.0 ml LV vol d, MOD A4C: 116.0 ml LV vol s, MOD A2C: 49.5 ml LV vol s, MOD A4C: 51.0 ml LV SV MOD A2C:     59.5 ml LV SV MOD A4C:     116.0 ml LV SV MOD BP:      63.3 ml LEFT ATRIUM             Index       RIGHT ATRIUM           Index LA diam:        3.80 cm 2.09 cm/m  RA Area:     16.00 cm LA Vol (A2C):   44.9 ml 24.75 ml/m RA Volume:   40.20 ml  22.16 ml/m LA Vol  (A4C):   44.0 ml 24.25 ml/m LA Biplane Vol: 46.4 ml 25.58 ml/m  AORTIC VALVE LVOT Vmax:   102.00 cm/s LVOT Vmean:  78.100 cm/s LVOT VTI:    0.187 m  AORTA Ao Root diam: 2.40 cm Ao Asc diam:  2.60 cm MITRAL VALVE                TRICUSPID VALVE MV Area (PHT): 4.49 cm     TR Peak grad:   21.2 mmHg MV Decel Time: 169 msec     TR Vmax:        230.00 cm/s MV E velocity: 108.00 cm/s MV A velocity: 88.80 cm/s   SHUNTS MV E/A ratio:  1.22         Systemic VTI:  0.19 m                             Systemic Diam: 2.00 cm Epifanio Lesches MD Electronically signed by Epifanio Lesches MD Signature Date/Time: 04/25/2019/7:50:41 PM    Final    Telemetry    04/27/19 NSR HR 80-90's - Personally Reviewed  ECG    No new tracing as of 04/27/2019- Personally Reviewed  Cardiac Studies   Cath: 04/24/19   Bypass graft failure with total occlusion of the SVG to the obtuse marginal, and total occlusion of the SVG to the posterolateral branch.  Patent very small diameter saphenous vein graft to the first diagonal.  Patent LIMA to the LAD but with distal anastomosis 90% stenosis in the very distal LIMA and beyond the graft insertion site in the LAD there is 99% stenosis that is also focal.  Left main is widely patent  LAD is patent into the mid vessel where there is competitive flow with the LIMA. The first diagonal is patent with competitive flow from the bypass graft.  The second obtuse marginal of the circumflex is totally occluded in its mid segment and tiny distal bifurcation branches are seen to fill by left to right collaterals. This appearance strongly suggest that the saphenous  vein graft to the marginal is totally occluded.  Right coronary is severely and diffusely diseased involving diffuse mild in-stent restenosis in the proximal stent, tandem 80% distal stenoses before the origin of the PDA, 60 to 70% stenosis in the proximal PDA, and total occlusion of the continuation of the right coronary  before what used to be a large left ventricular branch. We were unable to demonstrate patency of the graft to the LV branch despite our best effort and multiple catheters.  Overall normal LV function. Possible mild inferobasal hypokinesis. EF 70%. LVEDP is normal.  RECOMMENDATIONS:   Difficult situation in this young female. Unfortunately the left internal mammary distal anastomosis and the native vessel beyond the graft anastomosis is severely stenotic. Antegrade approach for PCI would be complicated due to LIMA tortuosity. Perhaps an antegrade LAD approach could be attempted with transient balloon occlusion of the LIMA to allow visualization of antegrade flow.  I am not absolutely sure that the obtuse marginal graft and the PL graft are totally occluded but significant time was spent attempting to visualize these grafts and there was no evidence of flow. Oddly, I was never able to confidently feel that I was in a stop for either of these grafts.  Management strategy will need full attention of the interventional team with reference to ideas concerning percutaneous revascularization. I do not think that she would be a candidate for repeat surgery.  Diagnostic Dominance: Right  Cath: 04/25/19  1. Occluded SVG to OM and SVG to RCA. 2. Successful angioplasty and 2 drug-eluting stent placement to the proximal and mid/distal LAD. Very difficult procedure due to significant mid vessel tortuosity that caused obstructive flow every time I advanced the wire. Initially, I started working on the LIMA to LAD but the vessel was extremely tortuous. There was loss of flow every time I advanced the wire to the LAD due to pseudolesions. Thus, I elected to switch to treating the native vessel.  Recommendations: This was a very difficult procedure. I exceeded contrast limit and thus did not proceed with RCA PCI. In addition, sedating the patient was almost impossible and required 7 mg of  Versed and 75 mcg of fentanyl. General anesthesia might need to be required with next PCI. Continue dual antiplatelet therapy for at least 1 year and preferably indefinitely. Staged RCA PCI can be considered in the outpatient setting or inpatient if the patient continues to have chest pain.  Diagnostic Dominance: Right  Intervention     Patient Profile     54 y.o. 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 Cliffton AstersLightfoot with LIMA to LAD, SVG to PDA, SVG to OM3 and SVG to D1, HTN, HLD, GERD, tobacco abuse, alcohol abuse, and bipolar d/o with prior suicide attempt.  Assessment & Plan    1. CAD/NSTEMI: -Underwent cardiac cath 04/25/19 with Dr. Kirke CorinArida as noted above with successful but complex angioplasty with DESx2 to the m/dLAD. Unable to intervene on LIMA-LAD given tortuosity.  -Placed on DAPT with ASA/Brilinta for at least one year, likely indefinitely.  -Has known native RCA disease with occluded SVG-RPDA, unable to work on 04/25/19 as contrast time was exceeded the limit.   -Started on Imdur 3/18/2021with improved symptoms  -Continue ASA, Brilinta, beta-blocker, ARB and statin -Feels her anxiety is contributing>> Xanax transition to Ativan -Plan was for discharge today, for appointment with Dr. Katrinka BlazingSmith however it appears the appointment  was 04/26/19 not today therefore she has no close follow up with him planned at this time   2. HLD:  -Last LDL, 46  -Continue crestor 40mg  daily, LDL 46.  -Zetia added to her HLD regimen -Repeat lab work in 6 to 8 weeks  3. HTN:  -Stable, 113/62>109/52>113/62 -Continue current regimen   Signed, NP-[facility] Pager: [phone] 04/27/2019, 7:01 AM    I have examined the patient and reviewed assessment and plan and discussed with patient.  Agree with above as stated.  She has had more angina with walking.  She does not want to go home until she has  her second procedure to fix the RCA.  I again reassured her that the police will not come and arrest her since she missed her jury duty a couple of days ago as she was in the hospital.  She has a valid excuse.  We will make her n.p.o. at this point.  Her case will likely be done this afternoon.  Hopefully, things will go well and she will be agreeable to discharge tomorrow.  04/29/2019    For questions or updates, please contact   Please consult [email or web address] for contact info under Cardiology/STEMI.

## 2019-04-27 NOTE — Care Management (Addendum)
Patient is uninsured.  Brilinta out of pocket is $470 a month. First month can be covered with coupon for free. Patient can also apply to the patient assistance program through [employer] at follow up for copay reduction. Please consider AS/ Plavix as this would be more afforable for patient if samples cannot be provided at hospital follow up appointments, or if patient doesn't qualify for patient assistance.   Patient provided with 30 day Brilinta coupon, as well as Patient assistance papers. Spoke w Chales Abrahams Placey of [facility], patient can go to clinic as walk-in for help w papers. Added to AVS Explained to patient

## 2019-04-27 NOTE — Interval H&P Note (Signed)
Cath Lab Visit (complete for each Cath Lab visit)  Clinical Evaluation Leading to the Procedure:   ACS: No.  Non-ACS:    Anginal Classification: CCS III  Anti-ischemic medical therapy: Minimal Therapy (1 class of medications)  Non-Invasive Test Results: No non-invasive testing performed  Prior CABG: Previous CABG      History and Physical Interval Note:  04/27/2019 3:43 PM  [name]  has presented today for surgery, with the diagnosis of CAD.  The various methods of treatment have been discussed with the patient and family. After consideration of risks, benefits and other options for treatment, the patient has consented to  Procedure(s): CORONARY STENT INTERVENTION (N/A) as a surgical intervention.  The patient's history has been reviewed, patient examined, no change in status, stable for surgery.  I have reviewed the patient's chart and labs.  Questions were answered to the patient's satisfaction.     Tonny Bollman

## 2019-04-27 NOTE — Progress Notes (Signed)
CARDIAC REHAB PHASE I   PRE:  Rate/Rhythm: 67 SR    BP: sitting 117/59    SaO2:   MODE:  Ambulation: 400 ft   POST:  Rate/Rhythm: 96 SR    BP: sitting 96/72     SaO2:   Pt ambulated slower but still had left chest sensation toward end of walk. Sts it resolved with rest. BP lower after walking but denied dizziness. Discussed Brilinta, restrictions, diet in depth, exercise, NTG and CRPII. Pt receptive, asking appropriate questions. She does need reiteration. Will refer to [place] CRPII. Pt is eager to start.  Pt is interested in participating in Virtual Cardiac and Pulmonary Rehab. Pt advised that Virtual Cardiac and Pulmonary Rehab is provided at no cost to the patient.  Checklist:  1. Pt has smart device  ie smartphone and/or ipad for downloading an app  Yes 2. Reliable internet/wifi service    Yes 3. Understands how to use their smartphone and navigate within an app.  Yes Pt verbalized understanding and is in agreement.  [id number]  Harriet Masson CES, ACSM 04/27/2019 10:01 AM

## 2019-04-28 ENCOUNTER — Other Ambulatory Visit: Payer: Self-pay

## 2019-04-28 LAB — BASIC METABOLIC PANEL
Anion gap: 10 (ref 5–15)
BUN: 6 mg/dL (ref 6–20)
CO2: 22 mmol/L (ref 22–32)
Calcium: 8.7 mg/dL — ABNORMAL LOW (ref 8.9–10.3)
Chloride: 104 mmol/L (ref 98–111)
Creatinine, Ser: 0.94 mg/dL (ref 0.44–1.00)
GFR calc Af Amer: >60 mL/min (ref >60)
GFR calc non Af Amer: >60 mL/min (ref >60)
Glucose, Bld: 120 mg/dL — ABNORMAL HIGH (ref 70–99)
Potassium: 3.9 mmol/L (ref 3.5–5.1)
Sodium: 136 mmol/L (ref 135–145)

## 2019-04-28 LAB — CBC
HCT: 32.9 % — ABNORMAL LOW (ref 36.0–46.0)
Hemoglobin: 10 g/dL — ABNORMAL LOW (ref 12.0–15.0)
MCH: 26.2 pg (ref 26.0–34.0)
MCHC: 30.4 g/dL (ref 30.0–36.0)
MCV: 86.1 fL (ref 80.0–100.0)
Platelets: 264 10*3/uL (ref 150–400)
RBC: 3.82 MIL/uL — ABNORMAL LOW (ref 3.87–5.11)
RDW: 17 % — ABNORMAL HIGH (ref 11.5–15.5)
WBC: 8.7 10*3/uL (ref 4.0–10.5)
nRBC: 0 % (ref 0.0–0.2)

## 2019-04-28 NOTE — Progress Notes (Addendum)
Progress Note  Patient Name: [name] Date of Encounter: 04/28/2019  Primary Cardiologist: Sinclair Grooms, MD   Subjective   Successful PCI/DES to RCA yesterday. Patient has not had recurrent chest pain. Has some tenderness/pressure in her right groin cath site when ambulating. Possible discharge.   Inpatient Medications    Scheduled Meds: . amLODipine  5 mg Oral Daily  . aspirin  81 mg Oral Daily  . atomoxetine  40 mg Oral q morning - 10a  . busPIRone  10 mg Oral TID  . escitalopram  20 mg Oral Daily  . ezetimibe  10 mg Oral Daily  . gabapentin  300 mg Oral TID  . isosorbide mononitrate  30 mg Oral Daily  . losartan  12.5 mg Oral Daily  . metoprolol tartrate  25 mg Oral BID  . pantoprazole  40 mg Oral Daily  . QUEtiapine  25 mg Oral QHS  . rosuvastatin  40 mg Oral Daily  . sodium chloride flush  3 mL Intravenous Q12H  . sodium chloride flush  3 mL Intravenous Q12H  . sodium chloride flush  3 mL Intravenous Q12H  . sodium chloride flush  3 mL Intravenous Q12H  . sodium chloride flush  3 mL Intravenous Q12H  . sodium chloride flush  3 mL Intravenous Q12H  . ticagrelor  90 mg Oral BID   Continuous Infusions: . sodium chloride    . sodium chloride    . sodium chloride    . sodium chloride     PRN Meds: sodium chloride, sodium chloride, sodium chloride, sodium chloride, acetaminophen, albuterol, alum & mag hydroxide-simeth, LORazepam, nitroGLYCERIN, ondansetron (ZOFRAN) IV, oxyCODONE, sodium chloride flush, sodium chloride flush, sodium chloride flush, sodium chloride flush, zolpidem   Vital Signs    Vitals:   04/27/19 2116 04/28/19 0100 04/28/19 0413 04/28/19 0959  BP:   (!) 97/57 124/63  Pulse: 78  72 66  Resp:   (!) 24   Temp:   98.9 F (37.2 C)   TempSrc:   Oral   SpO2:  100% 99%   Weight:   85.1 kg   Height:        Intake/Output Summary (Last 24 hours) at 04/28/2019 1111 Last data filed at 04/28/2019 0618 Gross per 24 hour  Intake 2240.21 ml    Output 1 ml  Net 2239.21 ml   Last 3 Weights 04/28/2019 04/27/2019 04/26/2019  Weight (lbs) 187 lb 11.2 oz 186 lb 8 oz 148 lb 12.8 oz  Weight (kg) 85.14 kg 84.596 kg 67.495 kg  Some encounter information is confidential and restricted. Go to Review Flowsheets activity to see all data.      Telemetry    NSR, HR 80-90s, no other arrhythmias noted - Personally Reviewed  ECG    NSR, HR 66bpm, TWI V4-V6 - Personally Reviewed  Physical Exam   GEN: No acute distress.   Neck: No JVD Cardiac: RRR, no murmurs, rubs, or gallops.  Respiratory: Clear to auscultation bilaterally. GI: Soft, nontender, non-distended  MS: No edema; No deformity; right groin site without hematoma or bleeding Neuro:  Nonfocal  Psych: Normal affect   Labs    High Sensitivity Troponin:   Recent Labs  Lab 04/24/19 0919 04/24/19 1333 04/24/19 1841 04/24/19 2255  TROPONINIHS 3,582* 4,127* 3,418* 3,106*      Chemistry Recent Labs  Lab 04/24/19 1841 04/25/19 0628 04/26/19 0404 04/27/19 0859 04/28/19 0346  NA  --    < > 141 136 136  K  --    < > 3.9 3.8 3.9  CL  --    < > 108 104 104  CO2  --    < > 21* 22 22  GLUCOSE  --    < > 102* 114* 120*  BUN  --    < > 5* 6 6  CREATININE  --    < > 0.83 0.93 0.94  CALCIUM  --    < > 8.7* 8.4* 8.7*  PROT 7.0  --   --   --   --   ALBUMIN 3.6  --   --   --   --   AST 40  --   --   --   --   ALT 17  --   --   --   --   ALKPHOS 68  --   --   --   --   BILITOT 0.6  --   --   --   --   GFRNONAA  --    < > >60 >60 >60  GFRAA  --    < > >60 >60 >60  ANIONGAP  --    < > 12 10 10    < > = values in this interval not displayed.     Hematology Recent Labs  Lab 04/26/19 0404 04/27/19 0205 04/28/19 0346  WBC 14.7* 12.3* 8.7  RBC 3.73* 3.77* 3.82*  HGB 10.0* 9.9* 10.0*  HCT 31.8* 32.7* 32.9*  MCV 85.3 86.7 86.1  MCH 26.8 26.3 26.2  MCHC 31.4 30.3 30.4  RDW 16.9* 17.0* 17.0*  PLT 264 244 264    BNPNo results for input(s): BNP, PROBNP in the last 168  hours.   DDimer No results for input(s): DDIMER in the last 168 hours.   Radiology    CARDIAC CATHETERIZATION  Result Date: 04/27/2019 Successful PCI of severe tandem stenoses in the distal RCA using a 2.75x38 mm Resolute Onyx DES   Cardiac Studies   Cath: 04/24/19   Bypass graft failure with total occlusion of the SVG to the obtuse marginal, and total occlusion of the SVG to the posterolateral branch.  Patent very small diameter saphenous vein graft to the first diagonal.  Patent LIMA to the LAD but with distal anastomosis 90% stenosis in the very distal LIMA and beyond the graft insertion site in the LAD there is 99% stenosis that is also focal.  Left main is widely patent  LAD is patent into the mid vessel where there is competitive flow with the LIMA. The first diagonal is patent with competitive flow from the bypass graft.  The second obtuse marginal of the circumflex is totally occluded in its mid segment and tiny distal bifurcation branches are seen to fill by left to right collaterals. This appearance strongly suggest that the saphenous vein graft to the marginal is totally occluded.  Right coronary is severely and diffusely diseased involving diffuse mild in-stent restenosis in the proximal stent, tandem 80% distal stenoses before the origin of the PDA, 60 to 70% stenosis in the proximal PDA, and total occlusion of the continuation of the right coronary before what used to be a large left ventricular branch. We were unable to demonstrate patency of the graft to the LV branch despite our best effort and multiple catheters.  Overall normal LV function. Possible mild inferobasal hypokinesis. EF 70%. LVEDP is normal.  RECOMMENDATIONS:   Difficult situation in this young female. Unfortunately the left internal mammary distal anastomosis  and the native vessel beyond the graft anastomosis is severely stenotic. Antegrade approach for PCI would be complicated due to LIMA  tortuosity. Perhaps an antegrade LAD approach could be attempted with transient balloon occlusion of the LIMA to allow visualization of antegrade flow.  I am not absolutely sure that the obtuse marginal graft and the PL graft are totally occluded but significant time was spent attempting to visualize these grafts and there was no evidence of flow. Oddly, I was never able to confidently feel that I was in a stop for either of these grafts.  Management strategy will need full attention of the interventional team with reference to ideas concerning percutaneous revascularization. I do not think that she would be a candidate for repeat surgery.  Diagnostic Dominance: Right  Cath: 04/25/19  1. Occluded SVG to OM and SVG to RCA. 2. Successful angioplasty and 2 drug-eluting stent placement to the proximal and mid/distal LAD. Very difficult procedure due to significant mid vessel tortuosity that caused obstructive flow every time I advanced the wire. Initially, I started working on the LIMA to LAD but the vessel was extremely tortuous. There was loss of flow every time I advanced the wire to the LAD due to pseudolesions. Thus, I elected to switch to treating the native vessel.  Recommendations: This was a very difficult procedure. I exceeded contrast limit and thus did not proceed with RCA PCI. In addition, sedating the patient was almost impossible and required 7 mg of Versed and 75 mcg of fentanyl. General anesthesia might need to be required with next PCI. Continue dual antiplatelet therapy for at least 1 year and preferably indefinitely. Staged RCA PCI can be considered in the outpatient setting or inpatient if the patient continues to have chest pain.  Diagnostic Dominance: Right  Intervention      Patient Profile     54 y.o. 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 Cliffton Asters with LIMA to LAD,  SVG to PDA, SVG to OM# and SVG to D1,  HTN, HLD, GERD, tobacco abuse, alcohol abuse, bipolar disorder, and prior suicide attempt who is being seen for   Assessment & Plan    CAD/NSTEMI - Underwent cath 04/25/19 with Dr. Kirke Corin as noted with successful but complex angioplasty with DESx2 to the m/d LAD and unable to intervene on LIMA-LAD given tortuosity. Has known RCA disease with occluded SVG-RPDA but unable to intervene 04/25/19 due to contrast. - Placed on DAPT with ASA and Brilinta for 1 year, likely indefinitely - Continue ASA, Brilinta, BB, ARB, statin - Imdur 30 mg daily started - Xanax changed to Ativan for anxiety - Plan was for discharge but she chose to stay for RCA intervention. She underwent successful PCI/DES of severe tandem stenosis on the distal RCA yesterday - Patinet ambulating without chest pain or sob.  - Labs today with creatinine 0.94, Hgb 10.0 - Rt groin cath site is stable with some tenderness when ambulating - Patient will need excuse for Jury duty - Meds have already been sent to [facility] pharmacy  HLD - LDL 46 - continue crestor 40mg  daily - Zetia added  HTN - some soft pressures, most recent 124/63 - continue current regimen  For questions or updates, please contact [facility] Please consult [email or web address] for contact info under        Signed, Cadence , PA-C  04/28/2019, 11:11 AM    Patient  seen and examined with Cadence Furth PAC.  Agree as above, with the following exceptions and changes as noted below. No recurrent chest pain, some mild chest wall soreness. Gen: NAD, CV: RRR, no murmurs, Lungs: clear, Abd: soft, Extrem: Warm, well perfused, no edema, Neuro/Psych: alert and oriented x 3, normal mood and affect. All available labs, radiology testing, previous records reviewed. Patient would like to stay an additional night due to no support at home today. We reviewed medications in detail. Will plan for early AM discharge.   Parke Poisson 04/28/19 12:02 PM

## 2019-04-28 NOTE — Discharge Summary (Signed)
Discharge Summary    Patient ID: [name] MRN: [id number]; DOB: [birth year]  Admit date: 04/24/2019 Discharge date: 04/29/2019  Primary Care Provider: Lavinia Sharps, NP  Primary Cardiologist: Lesleigh Noe, MD  Primary Electrophysiologist:  None   Discharge Diagnoses    Principal Problem:   CAD (coronary artery disease) Active Problems:   Hypertension   Tobacco abuse   Opiate abuse, episodic (HCC)   OSA (obstructive sleep apnea)   Bipolar disorder (HCC)   Chronic diastolic CHF (congestive heart failure) (HCC)   HLD (hyperlipidemia)   NSTEMI (non-ST elevated myocardial infarction) (HCC)   S/P CABG x 4    Diagnostic Studies/Procedures    Cath: 04/24/19   Bypass graft failure with total occlusion of the SVG to the obtuse marginal, and total occlusion of the SVG to the posterolateral branch.  Patent very small diameter saphenous vein graft to the first diagonal.  Patent LIMA to the LAD but with distal anastomosis 90% stenosis in the very distal LIMA and beyond the graft insertion site in the LAD there is 99% stenosis that is also focal.  Left main is widely patent  LAD is patent into the mid vessel where there is competitive flow with the LIMA. The first diagonal is patent with competitive flow from the bypass graft.  The second obtuse marginal of the circumflex is totally occluded in its mid segment and tiny distal bifurcation branches are seen to fill by left to right collaterals. This appearance strongly suggest that the saphenous vein graft to the marginal is totally occluded.  Right coronary is severely and diffusely diseased involving diffuse mild in-stent restenosis in the proximal stent, tandem 80% distal stenoses before the origin of the PDA, 60 to 70% stenosis in the proximal PDA, and total occlusion of the continuation of the right coronary before what used to be a large left ventricular branch. We were unable to demonstrate patency of the graft to the  LV branch despite our best effort and multiple catheters.  Overall normal LV function. Possible mild inferobasal hypokinesis. EF 70%. LVEDP is normal.  RECOMMENDATIONS:   Difficult situation in this young female. Unfortunately the left internal mammary distal anastomosis and the native vessel beyond the graft anastomosis is severely stenotic. Antegrade approach for PCI would be complicated due to LIMA tortuosity. Perhaps an antegrade LAD approach could be attempted with transient balloon occlusion of the LIMA to allow visualization of antegrade flow.  I am not absolutely sure that the obtuse marginal graft and the PL graft are totally occluded but significant time was spent attempting to visualize these grafts and there was no evidence of flow. Oddly, I was never able to confidently feel that I was in a stop for either of these grafts.  Management strategy will need full attention of the interventional team with reference to ideas concerning percutaneous revascularization. I do not think that she would be a candidate for repeat surgery.  Diagnostic Dominance: Right  Cath: 04/25/19  1. Occluded SVG to OM and SVG to RCA. 2. Successful angioplasty and 2 drug-eluting stent placement to the proximal and mid/distal LAD. Very difficult procedure due to significant mid vessel tortuosity that caused obstructive flow every time I advanced the wire. Initially, I started working on the LIMA to LAD but the vessel was extremely tortuous. There was loss of flow every time I advanced the wire to the LAD due to pseudolesions. Thus, I elected to switch to treating the native vessel.  Recommendations: This was a  very difficult procedure. I exceeded contrast limit and thus did not proceed with RCA PCI. In addition, sedating the patient was almost impossible and required 7 mg of Versed and 75 mcg of fentanyl. General anesthesia might need to be required with next PCI. Continue dual  antiplatelet therapy for at least 1 year and preferably indefinitely. Staged RCA PCI can be considered in the outpatient setting or inpatient if the patient continues to have chest pain.  Diagnostic Dominance: Right  Intervention    Echo 04/25/19 1. Left ventricular ejection fraction, by estimation, is 60 to 65%. The  left ventricle has normal global function. Basal inferior hypokinesis.  There is mild left ventricular hypertrophy. Left ventricular diastolic  parameters were normal.  2. Right ventricular systolic function is normal. The right ventricular  size is normal. There is normal pulmonary artery systolic pressure.  3. The mitral valve is normal in structure. No evidence of mitral valve  regurgitation.  4. The aortic valve was not well visualized. Aortic valve regurgitation  is not visualized. No aortic stenosis is present.   __________   History of Present Illness     [name] is a 54 y.o. female with history of NSTEMI in 2012 with DES to LCX and DES to the RCA, recurrent NSTEMI in 01/2019 with CABG per Dr. Cliffton Asters with LIMA to LAD, SCG to PDA, SVG to OM3 and SVG to D1, HTN, HLD, GERD, previous alcohol/tobacco abuse, bipolar disorder, and prior suicide attempt who was evaluated for chest pain. She has had multiple ER visits for chest pain since her CABG. She is also a very anxious person. She had recently seen cardiology 03/26/19 for chest wall pain and was recommended to take Tylenol or contact [facility]. Her chest pain had started a little over a week prior. She also felt decreased energy and was unable to walk to same distance without feeling very tired. They day before she developed exertional chest pain that reminded her of her previous angina. It was in the middle of her chest and was 10/10. She took 2 Advil which mildly improved the pain. The pain started to get worse and radiated into her shoulder and up into her neck and decided to go to the ER. Denied lower leg  edema, orthopnea, PND.   Hospital Course     Consultants: None  The patient presented to the ED at [facility] 04/24/19 for unstable angina. In the ER she was given  Aspirin, 4000 units of heparin,  of morphine, and0.5mg  Ativan. Cardiology saw her and was still complaining of 7/10 chest pain. She was started on IV NTG but her systolic pressures dropped in the to 60s and she had be given IVF bolus and IV NTG was decreased. EKG showed sinus with heart rate 61, lateral T wave changes seen on previous EKG, no ST elevation or depression. Hs troponin peaked at 4127. The patient was started on IV heparin and transferred to [facility] cardiac cath. Right groin site access was established. Cath showed occluded SVG to OM and SVG to RCA. She was treated with DES x 2 to the proximal and mid/distal LAD. It was a very difficult procedure due to significant mid vessel tortuosity that causes obstructive flow every time the wire was advanced. LIMA had disease but unable to perform intervention due to compromised flow when straightened with a wire. The contrast limit was exceeded and thus did not proceed with PCI to RCA. Sedation was also very challenging. Patient tolerated  the procedure well. Post procedure recommendations for DAPT with Aspirin and Brilinta for 1 year. Other recommendations for possible staged PCI to the RCA. Plan to treat LIMA medically. Echo showed EF 60-65%, basal inferior hypokinesis, mild LVH. Patient had minimal chest pain and was started on Imdur. The plan was discharge with close follow-up with Dr. Katrinka Blazing but the patient chose to stay for staged PCI RCA. The patient was brought back down to the cath lab and again right groin access was established. She underwent successful PCI to the RCA. Plan to continue with DAPT for at least 12 months. The patient remained stable and did not have recurrent chest pain. She did report some tenderness in the groin on ambulation. Cardiac  rehab saw patient as well. Labs showed creatinine 0.94 and Hgb 10.0. Zetia was added to cholesterol management. Will arrange follow-up. Will send in new prescriptions for Imdur 30 mg daily, Losartan 12.5mg  BID, Aspirin 81 mg daily, Brilinta 90mg  BID, and SL NTG.  The patient was evaluated by Dr. on 04/29/19 and felt to be stable for discharge.   GEN:No acute distress.   Neck:No JVD Cardiac:RRR, no murmurs, rubs, or gallops.  Respiratory:mild wheezing. 05/01/19, nontender, non-distended  MS:No edema; No deformity; right groin site without hematoma or bleeding Neuro:Nonfocal  Psych: Normal affect   Did the patient have an acute coronary syndrome (MI, NSTEMI, STEMI, etc) this admission?:  Yes                               AHA/ACC Clinical Performance & Quality Measures: 1. Aspirin prescribed? - Yes 2. ADP Receptor Inhibitor (Plavix/Clopidogrel, Brilinta/Ticagrelor or Effient/Prasugrel) prescribed (includes medically managed patients)? - Yes 3. Beta Blocker prescribed? - Yes 4. High Intensity Statin (Lipitor 40-80mg  or Crestor 20-40mg ) prescribed? - Yes 5. EF assessed during THIS hospitalization? - Yes 6. For EF <40%, was ACEI/ARB prescribed? - Not Applicable (EF >/= 40%) 7. For EF <40%, Aldosterone Antagonist (Spironolactone or Eplerenone) prescribed? - Not Applicable (EF >/= 40%) 8. Cardiac Rehab Phase II ordered (Included Medically managed Patients)? - Yes   _____________  Discharge Vitals Blood pressure 101/63, pulse 70, temperature 98.3 F (36.8 C), temperature source Oral, resp. rate 16, height 5\' 1"  (1.549 m), weight 86.1 kg, SpO2 97 %.  Filed Weights   04/27/19 0644 04/28/19 0413 04/29/19 0523  Weight: 84.6 kg 85.1 kg 86.1 kg    Labs & Radiologic Studies    CBC Recent Labs    04/28/19 0346 04/29/19 0323  WBC 8.7 8.0  HGB 10.0* 9.7*  HCT 32.9* 31.9*  MCV 86.1 84.8  PLT 264 276   Basic Metabolic Panel Recent Labs    04/30/19 0859 04/28/19 0346  NA  136 136  K 3.8 3.9  CL 104 104  CO2 22 22  GLUCOSE 114* 120*  BUN 6 6  CREATININE 0.93 0.94  CALCIUM 8.4* 8.7*   Liver Function Tests No results for input(s): AST, ALT, ALKPHOS, BILITOT, PROT, ALBUMIN in the last 72 hours. No results for input(s): LIPASE, AMYLASE in the last 72 hours. High Sensitivity Troponin:   Recent Labs  Lab 04/24/19 0919 04/24/19 1333 04/24/19 1841 04/24/19 2255  TROPONINIHS 3,582* 4,127* 3,418* 3,106*    BNP Invalid input(s): POCBNP D-Dimer No results for input(s): DDIMER in the last 72 hours. Hemoglobin A1C No results for input(s): HGBA1C in the last 72 hours. Fasting Lipid Panel No results for input(s): CHOL, HDL, LDLCALC, TRIG, CHOLHDL,  LDLDIRECT in the last 72 hours. Thyroid Function Tests No results for input(s): TSH, T4TOTAL, T3FREE, THYROIDAB in the last 72 hours.  Invalid input(s): FREET3 _____________  DG Chest 2 View  Result Date: 04/24/2019 CLINICAL DATA:  Central chest pain since last night radiating to LEFT arm and jaw, coronary artery disease post CABG EXAM: CHEST - 2 VIEW COMPARISON:  04/06/2019 FINDINGS: Upper normal size of cardiac silhouette post CABG. Mediastinal contours and pulmonary vascularity normal. Lungs clear. No pulmonary infiltrate, pleural effusion or pneumothorax. Osseous structures unremarkable. IMPRESSION: No acute abnormalities. Electronically Signed   By: Ulyses Southward M.D.   On: 04/24/2019 10:02   DG Chest 2 View  Result Date: 04/06/2019 CLINICAL DATA:  Chest pain. Previous coronary artery bypass grafting EXAM: CHEST - 2 VIEW COMPARISON:  March 26, 2019 FINDINGS: Previous left pleural effusion and left base consolidation have resolved. Lungs are clear. Heart is upper normal in size with pulmonary vascularity normal. No adenopathy. Patient is status post coronary artery bypass grafting. No pneumothorax. There is mild degenerative change in the thoracic spine. IMPRESSION: Interval resolution of left base consolidation  and pleural effusion compared to prior study. Currently lungs clear. Stable cardiac silhouette. Postoperative changes noted. Electronically Signed   By: Bretta Bang III M.D.   On: 04/06/2019 14:57   CARDIAC CATHETERIZATION  Result Date: 04/27/2019 Successful PCI of severe tandem stenoses in the distal RCA using a 2.75x38 mm Resolute Onyx DES  CARDIAC CATHETERIZATION  Result Date: 04/25/2019  Prox LAD lesion is 50% stenosed.  1st Diag lesion is 80% stenosed.  Dist LAD-2 lesion is 70% stenosed.  Dist LAD-1 lesion is 95% stenosed.  1st Mrg lesion is 50% stenosed.  3rd Mrg lesion is 80% stenosed.  Mid Cx to Dist Cx lesion is 90% stenosed.  Dist Cx lesion is 100% stenosed.  Ost RCA to Prox RCA lesion is 5% stenosed.  RPDA lesion is 95% stenosed.  Dist RCA lesion is 100% stenosed.  Mid RCA-1 lesion is 80% stenosed.  Prox RCA lesion is 70% stenosed.  Mid RCA-2 lesion is 90% stenosed.  Mid RCA to Dist RCA lesion is 40% stenosed.  Origin lesion is 100% stenosed.  And is small.  Origin lesion is 100% stenosed.  Dist Graft to Insertion lesion is 95% stenosed.  Mid LAD-1 lesion is 50% stenosed.  Post intervention, there is a 0% residual stenosis.  Mid LAD-2 lesion is 40% stenosed.  Dist LAD-3 lesion is 40% stenosed.  A drug-eluting stent was successfully placed using a STENT RESOLUTE ONYX 2.25X22.  Post intervention, there is a 0% residual stenosis.  Post intervention, there is a 0% residual stenosis.  A drug-eluting stent was successfully placed using a STENT RESOLUTE ONYX 3.0X12.  1.  Occluded SVG to OM and SVG to RCA. 2.  Successful angioplasty and 2 drug-eluting stent placement to the proximal and mid/distal LAD.  Very difficult procedure due to significant mid vessel tortuosity that caused obstructive flow every time I advanced the wire. Initially, I started working on the LIMA to LAD but the vessel was extremely tortuous.  There was loss of flow every time I advanced the wire to  the LAD due to pseudolesions.  Thus, I elected to switch to treating the native vessel. Recommendations: This was a very difficult procedure.  I exceeded contrast limit and thus did not proceed with RCA PCI. In addition, sedating the patient was almost impossible and required 7 mg of Versed and 75 mcg of fentanyl.  General anesthesia  might need to be required with next PCI. Continue dual antiplatelet therapy for at least 1 year and preferably indefinitely. Staged RCA PCI can be considered in the outpatient setting or inpatient if the patient continues to have chest pain.   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  Patent very small diameter saphenous vein graft to the first diagonal.  Patent LIMA to the LAD but with distal anastomosis 90% stenosis in the very distal LIMA and beyond the graft insertion site in the LAD there is 99% stenosis that is also focal.  Left main is widely patent  LAD is patent into the mid vessel where there is competitive flow with the LIMA.  The first diagonal is patent with competitive flow from the bypass graft.  The second obtuse marginal of the circumflex is totally occluded in its mid segment and tiny distal bifurcation branches are seen to fill by left to right collaterals.  This appearance strongly suggest that the saphenous vein graft to the marginal is totally occluded.  Right coronary is severely and diffusely diseased involving diffuse mild in-stent restenosis in the proximal stent, tandem 80% distal stenoses before the origin of the PDA, 60 to 70% stenosis in the proximal PDA, and total occlusion of the continuation of the right coronary before what used to be a large left ventricular branch.  We were unable to demonstrate patency of the graft to the LV branch despite our best effort and multiple catheters.  Overall normal  LV function.  Possible mild inferobasal hypokinesis.  EF 70%.  LVEDP is normal. RECOMMENDATIONS:  Difficult situation in this young female.  Unfortunately the left internal mammary distal anastomosis and the native vessel beyond the graft anastomosis is severely stenotic.  Antegrade approach for PCI would be complicated due to LIMA tortuosity.  Perhaps an antegrade LAD approach could be attempted with transient balloon occlusion of the LIMA to allow visualization of antegrade flow.  I am not absolutely sure that the obtuse marginal graft and the PL graft are totally occluded but significant time was spent attempting to visualize these grafts and there was no evidence of flow.  Oddly, I was never able to confidently feel that I was in a stop for either of these grafts.  Management strategy will need full attention of the interventional team with reference to ideas concerning percutaneous revascularization.  I do not think that she would be a candidate for repeat surgery.  ECHOCARDIOGRAM COMPLETE  Result Date: 04/25/2019    ECHOCARDIOGRAM REPORT   Patient Name:   [name] Date of Exam: 04/25/2019 Medical Rec #:  [id number]    Height:       61.0 in Accession #:    [id number]   Weight:       181.9 lb Date of Birth:  Apr 17, 1965    BSA:          1.814 m Patient Age:    53 years     BP:           138/81 mmHg Patient Gender: F            HR:           67 bpm. Exam Location:  Inpatient Procedure: 2D Echo Indications:    NSTEMI I21.4  History:        Patient has prior history of Echocardiogram examinations, most  recent 01/18/2019. CHF, CAD and Previous Myocardial Infarction,                 Prior CABG; Risk Factors:Hypertension, Dyslipidemia, Sleep Apnea                 and Former Smoker. Anemia. GERD. 04/25/19 two stents placed in                 the mid/distal LAD.  Sonographer:    Darlina Sicilian RDCS Referring Phys: [place]  1. Left ventricular ejection fraction, by  estimation, is 60 to 65%. The left ventricle has normal global function. Basal inferior hypokinesis. There is mild left ventricular hypertrophy. Left ventricular diastolic parameters were normal.  2. Right ventricular systolic function is normal. The right ventricular size is normal. There is normal pulmonary artery systolic pressure.  3. The mitral valve is normal in structure. No evidence of mitral valve regurgitation.  4. The aortic valve was not well visualized. Aortic valve regurgitation is not visualized. No aortic stenosis is present. FINDINGS  Left Ventricle: Left ventricular ejection fraction, by estimation, is 60 to 65%. The left ventricle has normal function. The left ventricle demonstrates regional wall motion abnormalities. The left ventricular internal cavity size was normal in size. There is mild left ventricular hypertrophy. Left ventricular diastolic parameters were normal. Right Ventricle: The right ventricular size is normal. No increase in right ventricular wall thickness. Right ventricular systolic function is normal. There is normal pulmonary artery systolic pressure. The tricuspid regurgitant velocity is 2.30 m/s, and  with an assumed right atrial pressure of 3 mmHg, the estimated right ventricular systolic pressure is 13.2 mmHg. Left Atrium: Left atrial size was normal in size. Right Atrium: Right atrial size was normal in size. Pericardium: Trivial pericardial effusion is present. Mitral Valve: The mitral valve is normal in structure. No evidence of mitral valve regurgitation. Tricuspid Valve: The tricuspid valve is normal in structure. Tricuspid valve regurgitation is trivial. Aortic Valve: The aortic valve was not well visualized. Aortic valve regurgitation is not visualized. No aortic stenosis is present. Pulmonic Valve: The pulmonic valve was not well visualized. Pulmonic valve regurgitation is not visualized. Aorta: The aortic root and ascending aorta are structurally normal, with no  evidence of dilitation. IAS/Shunts: The interatrial septum was not well visualized.  LEFT VENTRICLE PLAX 2D LVIDd:         3.35 cm      Diastology LVIDs:         2.45 cm      LV e' lateral:   13.10 cm/s LV PW:         0.95 cm      LV E/e' lateral: 8.2 LV IVS:        1.15 cm      LV e' medial:    8.49 cm/s LVOT diam:     2.00 cm      LV E/e' medial:  12.7 LV SV:         59 LV SV Index:   32 LVOT Area:     3.14 cm  LV Volumes (MOD) LV vol d, MOD A2C: 109.0 ml LV vol d, MOD A4C: 116.0 ml LV vol s, MOD A2C: 49.5 ml LV vol s, MOD A4C: 51.0 ml LV SV MOD A2C:     59.5 ml LV SV MOD A4C:     116.0 ml LV SV MOD BP:      63.3 ml LEFT ATRIUM  Index       RIGHT ATRIUM           Index LA diam:        3.80 cm 2.09 cm/m  RA Area:     16.00 cm LA Vol (A2C):   44.9 ml 24.75 ml/m RA Volume:   40.20 ml  22.16 ml/m LA Vol (A4C):   44.0 ml 24.25 ml/m LA Biplane Vol: 46.4 ml 25.58 ml/m  AORTIC VALVE LVOT Vmax:   102.00 cm/s LVOT Vmean:  78.100 cm/s LVOT VTI:    0.187 m  AORTA Ao Root diam: 2.40 cm Ao Asc diam:  2.60 cm MITRAL VALVE                TRICUSPID VALVE MV Area (PHT): 4.49 cm     TR Peak grad:   21.2 mmHg MV Decel Time: 169 msec     TR Vmax:        230.00 cm/s MV E velocity: 108.00 cm/s MV A velocity: 88.80 cm/s   SHUNTS MV E/A ratio:  1.22         Systemic VTI:  0.19 m                             Systemic Diam: 2.00 cm Epifanio Lesches MD Electronically signed by Epifanio Lesches MD Signature Date/Time: 04/25/2019/7:50:41 PM    Final    Disposition   Pt is being discharged home today in good condition.  Follow-up Plans & Appointments    Follow-up Information    Placey, Chales Abrahams, NP Follow up.   Why: [facility] COME AS A WALK-IN ON MONDAY WEDNESDAY OR THURSDAY for assistance with Brilinta paperwork. Chales Abrahams is emailing the staff so they are aware you are coming in Contact information: [address] [place] [address] [phone]        Lyn Records, MD Follow up.   Specialty:  Cardiology Why: The office will call to arrange follow-up.  Contact information: [address] [place] [address] [phone]          Discharge Instructions    Amb Referral to Cardiac Rehabilitation   Complete by: As directed    Diagnosis:  Coronary Stents PTCA NSTEMI     After initial evaluation and assessments completed: Virtual Based Care may be provided alone or in conjunction with Phase 2 Cardiac Rehab based on patient barriers.: Yes      Discharge Medications   Allergies as of 04/29/2019      Reactions   Lidocaine Anaphylaxis, Itching   Codeine Nausea And Vomiting   Percocet [oxycodone-acetaminophen] Nausea And Vomiting   Vicodin [hydrocodone-acetaminophen] Nausea And Vomiting   Atorvastatin Other (See Comments)   "This made me jittery and I didn't like the way it made me feel"   Latex Itching   Reaction to powder in latex gloves      Medication List    STOP taking these medications   aspirin 325 MG EC tablet Replaced by: aspirin 81 MG chewable tablet     TAKE these medications   albuterol 108 (90 Base) MCG/ACT inhaler Commonly known as: VENTOLIN HFA Inhale 2 puffs into the lungs every 6 (six) hours as needed for wheezing or shortness of breath.   amLODipine 5 MG tablet Commonly known as: NORVASC Take 1 tablet (5 mg total) by mouth daily.   aspirin 81 MG chewable tablet Chew 1 tablet (81 mg total)  by mouth daily. Replaces: aspirin 325 MG EC tablet   busPIRone 10 MG tablet Commonly known as: BUSPAR Take 1 tablet (10 mg total) by mouth 3 (three) times daily.   escitalopram 20 MG tablet Commonly known as: LEXAPRO Take 20 mg by mouth daily.   escitalopram 20 MG tablet Commonly known as: Lexapro Take 1 tablet (20 mg total) by mouth daily for 15 days.   ezetimibe 10 MG tablet Commonly known as: ZETIA Take 1 tablet (10 mg total) by mouth daily.   furosemide 20 MG tablet Commonly known as: LASIX Take 1 tablet (20 mg total)  by mouth as needed for fluid or edema.   gabapentin 300 MG capsule Commonly known as: NEURONTIN Take 1 capsule (300 mg total) by mouth 3 (three) times daily.   isosorbide mononitrate 30 MG 24 hr tablet Commonly known as: IMDUR Take 1 tablet (30 mg total) by mouth daily.   LORazepam 0.5 MG tablet Commonly known as: ATIVAN Take 1 tablet (0.5 mg total) by mouth daily.   losartan 25 MG tablet Commonly known as: COZAAR Take 0.5 tablets (12.5 mg total) by mouth daily.   metoprolol tartrate 25 MG tablet Commonly known as: LOPRESSOR Take 1 tablet (25 mg total) by mouth 2 (two) times daily.   nitroGLYCERIN 0.4 MG SL tablet Commonly known as: NITROSTAT Place 1 tablet (0.4 mg total) under the tongue every 5 (five) minutes x 3 doses as needed for chest pain.   pantoprazole 40 MG tablet Commonly known as: PROTONIX Take 40 mg by mouth daily.   rosuvastatin 40 MG tablet Commonly known as: CRESTOR Take 1 tablet (40 mg total) by mouth daily.   SEROquel 100 MG tablet Generic drug: QUEtiapine Take 25 mg by mouth at bedtime.   Strattera 40 MG capsule Generic drug: atomoxetine Take 40 mg by mouth every morning.   ticagrelor 90 MG Tabs tablet Commonly known as: BRILINTA Take 1 tablet (90 mg total) by mouth 2 (two) times daily.   traMADol 50 MG tablet Commonly known as: Ultram Take 1 tablet (50 mg total) by mouth every 6 (six) hours as needed for severe pain.          Outstanding Labs/Studies   Duration of Discharge Encounter   Greater than 30 minutes including physician time.  Signed, Cataleia Gade David Stall, PA-C 04/29/2019, 8:49 AM  Patient seen and examined.  Agree as above, with the following exceptions and changes as noted below. Reviewed medications thoroughly, encouraged patient to review non cardiac medications with PCP. Still sore in groin site, no acute findings. Gen: NAD, CV: RRR, no murmurs, Lungs: clear, Abd: soft, Extrem: Warm, well perfused, no edema, Neuro/Psych:  alert and oriented x 3, normal mood and affect. All available labs, radiology testing, previous records reviewed. Patient feeling well and ready to go home. Reviewed DAPT, I have instructed the patient that dual antiplatelet therapy should be taken for 1 year without interruption.  We have discussed the consequences of interrupted dual antiplatelet therapy and the risk for in-stent thrombosis.  Follow up in office in 1-2 weeks, with PCP within 7 days.  Jefrey Raburn David Stall, MD 04/29/19 8:49 AM

## 2019-04-28 NOTE — Progress Notes (Signed)
CARDIAC REHAB PHASE I   PRE:  Rate/Rhythm: 66 SR  BP:  Supine:   Sitting: 106/61  Standing:    SaO2: 99% RA  MODE:  Ambulation: 470 ft   POST:  Rate/Rhythm: 94 SR  BP:  Supine:   Sitting: 130/74  Standing:    SaO2: 100% RA  [id number] Patient tolerated ambulation well, gait steady. Patient denies chest pain or SOB with ambulation. Patient c/o groin swelling and pain, informed patient's nurse. To bed after walk, vital signs within normal limits. Call bell within reach.  Artist Pais, MS, ACSM CEP

## 2019-04-29 DIAGNOSIS — I5032 Chronic diastolic (congestive) heart failure: Secondary | ICD-10-CM

## 2019-04-29 LAB — CBC
HCT: 31.9 % — ABNORMAL LOW (ref 36.0–46.0)
Hemoglobin: 9.7 g/dL — ABNORMAL LOW (ref 12.0–15.0)
MCH: 25.8 pg — ABNORMAL LOW (ref 26.0–34.0)
MCHC: 30.4 g/dL (ref 30.0–36.0)
MCV: 84.8 fL (ref 80.0–100.0)
Platelets: 276 10*3/uL (ref 150–400)
RBC: 3.76 MIL/uL — ABNORMAL LOW (ref 3.87–5.11)
RDW: 16.5 % — ABNORMAL HIGH (ref 11.5–15.5)
WBC: 8 10*3/uL (ref 4.0–10.5)
nRBC: 0 % (ref 0.0–0.2)

## 2019-04-29 MED ORDER — EZETIMIBE 10 MG PO TABS: 10.0000 mg | ORAL_TABLET | Freq: Every day | ORAL | 6 refills | Status: DC

## 2019-04-29 MED ORDER — ASPIRIN 81 MG PO CHEW: 81.0000 mg | CHEWABLE_TABLET | Freq: Every day | ORAL | 3 refills | Status: DC

## 2019-04-29 MED ORDER — NITROGLYCERIN 0.4 MG SL SUBL: 0.4000 mg | SUBLINGUAL_TABLET | SUBLINGUAL | 1 refills | Status: DC | PRN

## 2019-04-29 MED ORDER — LORAZEPAM 0.5 MG PO TABS: 0.5000 mg | ORAL_TABLET | Freq: Every day | ORAL | 0 refills | Status: DC

## 2019-04-29 MED ORDER — LOSARTAN POTASSIUM 25 MG PO TABS: 12.5000 mg | ORAL_TABLET | Freq: Every day | ORAL | 6 refills | Status: DC

## 2019-04-29 MED ORDER — ISOSORBIDE MONONITRATE ER 30 MG PO TB24: 30.0000 mg | ORAL_TABLET | Freq: Every day | ORAL | 6 refills | Status: DC

## 2019-04-30 ENCOUNTER — Other Ambulatory Visit: Payer: Self-pay

## 2019-04-30 ENCOUNTER — Emergency Department (HOSPITAL_COMMUNITY)
Admission: EM | Admit: 2019-04-30 | Discharge: 2019-05-01 | Disposition: A | Discharge status: Home / Self Care | Payer: Self-pay | Attending: Emergency Medicine | Admitting: Emergency Medicine

## 2019-04-30 DIAGNOSIS — Z5321 Procedure and treatment not carried out due to patient leaving prior to being seen by health care provider: Secondary | ICD-10-CM | POA: Insufficient documentation

## 2019-04-30 DIAGNOSIS — R040 Epistaxis: Secondary | ICD-10-CM | POA: Insufficient documentation

## 2019-04-30 LAB — CBC WITH DIFFERENTIAL/PLATELET
Abs Immature Granulocytes: 0.02 10*3/uL (ref 0.00–0.07)
Basophils Absolute: 0.1 10*3/uL (ref 0.0–0.1)
Basophils Relative: 1 %
Eosinophils Absolute: 0.3 10*3/uL (ref 0.0–0.5)
Eosinophils Relative: 5 %
HCT: 37.5 % (ref 36.0–46.0)
Hemoglobin: 11.2 g/dL — ABNORMAL LOW (ref 12.0–15.0)
Immature Granulocytes: 0 %
Lymphocytes Relative: 17 %
Lymphs Abs: 1.1 10*3/uL (ref 0.7–4.0)
MCH: 25.6 pg — ABNORMAL LOW (ref 26.0–34.0)
MCHC: 29.9 g/dL — ABNORMAL LOW (ref 30.0–36.0)
MCV: 85.6 fL (ref 80.0–100.0)
Monocytes Absolute: 0.8 10*3/uL (ref 0.1–1.0)
Monocytes Relative: 13 %
Neutro Abs: 4 10*3/uL (ref 1.7–7.7)
Neutrophils Relative %: 64 %
Platelets: 357 10*3/uL (ref 150–400)
RBC: 4.38 MIL/uL (ref 3.87–5.11)
RDW: 16.1 % — ABNORMAL HIGH (ref 11.5–15.5)
WBC: 6.2 10*3/uL (ref 4.0–10.5)
nRBC: 0 % (ref 0.0–0.2)

## 2019-04-30 LAB — COMPREHENSIVE METABOLIC PANEL
ALT: 13 U/L (ref 0–44)
AST: 21 U/L (ref 15–41)
Albumin: 3.4 g/dL — ABNORMAL LOW (ref 3.5–5.0)
Alkaline Phosphatase: 51 U/L (ref 38–126)
Anion gap: 12 (ref 5–15)
BUN: 5 mg/dL — ABNORMAL LOW (ref 6–20)
CO2: 22 mmol/L (ref 22–32)
Calcium: 9.4 mg/dL (ref 8.9–10.3)
Chloride: 107 mmol/L (ref 98–111)
Creatinine, Ser: 0.79 mg/dL (ref 0.44–1.00)
GFR calc Af Amer: >60 mL/min (ref >60)
GFR calc non Af Amer: >60 mL/min (ref >60)
Glucose, Bld: 77 mg/dL (ref 70–99)
Potassium: 3.7 mmol/L (ref 3.5–5.1)
Sodium: 141 mmol/L (ref 135–145)
Total Bilirubin: 0.7 mg/dL (ref 0.3–1.2)
Total Protein: 7.3 g/dL (ref 6.5–8.1)

## 2019-04-30 LAB — PROTIME-INR
INR: 1.1 (ref 0.8–1.2)
Prothrombin Time: 14 seconds (ref 11.4–15.2)

## 2019-04-30 LAB — I-STAT BETA HCG BLOOD, ED (MC, WL, AP ONLY): I-stat hCG, quantitative: <5 m[IU]/mL (ref <5)

## 2019-04-30 MED FILL — Lidocaine HCl Local Preservative Free (PF) Inj 1%: INTRAMUSCULAR | Qty: 30 | Status: AC

## 2019-04-30 NOTE — ED Triage Notes (Signed)
Patient reports left epistaxis this evening , denies nasal injury , respirations unlabored , no fever or chills .

## 2019-05-01 ENCOUNTER — Ambulatory Visit: Payer: Self-pay | Admitting: *Deleted

## 2019-05-01 NOTE — Telephone Encounter (Signed)
Patient called communitiy line due to having nose bleeds after starting a new blood thinner. As I began to triage/ask questions I believe she realized she did not call her provider she then hung up immediately.

## 2019-05-02 ENCOUNTER — Ambulatory Visit: Payer: Self-pay | Admitting: Interventional Cardiology

## 2019-05-04 ENCOUNTER — Telehealth (HOSPITAL_COMMUNITY): Payer: Self-pay

## 2019-05-04 NOTE — Telephone Encounter (Signed)
Pt Medicaid is pending, no insurance coverage at this time.

## 2019-05-04 NOTE — Telephone Encounter (Signed)
Attempted to contact pt in regards to CR, unable to leave voicemail. Voicemail not set up.
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
## 2019-05-17 ENCOUNTER — Other Ambulatory Visit: Payer: Self-pay

## 2019-05-17 ENCOUNTER — Encounter: Payer: Self-pay | Admitting: Physician Assistant

## 2019-05-17 ENCOUNTER — Ambulatory Visit (INDEPENDENT_AMBULATORY_CARE_PROVIDER_SITE_OTHER): Payer: Self-pay | Admitting: Physician Assistant

## 2019-05-17 VITALS — BP 140/70 | HR 89 | Ht 61.0 in | Wt 180.8 lb

## 2019-05-17 DIAGNOSIS — E782 Mixed hyperlipidemia: Secondary | ICD-10-CM

## 2019-05-17 DIAGNOSIS — Z951 Presence of aortocoronary bypass graft: Secondary | ICD-10-CM

## 2019-05-17 DIAGNOSIS — I25700 Atherosclerosis of coronary artery bypass graft(s), unspecified, with unstable angina pectoris: Secondary | ICD-10-CM

## 2019-05-17 DIAGNOSIS — I10 Essential (primary) hypertension: Secondary | ICD-10-CM

## 2019-05-17 MED ORDER — TICAGRELOR 90 MG PO TABS: 90.0000 mg | ORAL_TABLET | Freq: Two times a day (BID) | ORAL | 11 refills | Status: DC

## 2019-05-17 MED ORDER — FUROSEMIDE 20 MG PO TABS: 20.0000 mg | ORAL_TABLET | ORAL | 1 refills | Status: DC | PRN

## 2019-05-17 MED ORDER — LOSARTAN POTASSIUM 25 MG PO TABS: 12.5000 mg | ORAL_TABLET | Freq: Every day | ORAL | 6 refills | Status: DC

## 2019-05-17 MED ORDER — METOPROLOL TARTRATE 25 MG PO TABS: 25.0000 mg | ORAL_TABLET | Freq: Two times a day (BID) | ORAL | 1 refills | Status: DC

## 2019-05-17 MED ORDER — EZETIMIBE 10 MG PO TABS: 10.0000 mg | ORAL_TABLET | Freq: Every day | ORAL | 6 refills | Status: DC

## 2019-05-17 MED ORDER — AMLODIPINE BESYLATE 5 MG PO TABS: 5.0000 mg | ORAL_TABLET | Freq: Every day | ORAL | 3 refills | Status: DC

## 2019-05-17 MED ORDER — NITROGLYCERIN 0.4 MG SL SUBL: 0.4000 mg | SUBLINGUAL_TABLET | SUBLINGUAL | 1 refills | Status: DC | PRN

## 2019-05-17 MED ORDER — ROSUVASTATIN CALCIUM 40 MG PO TABS: 40.0000 mg | ORAL_TABLET | Freq: Every day | ORAL | 1 refills | Status: DC

## 2019-05-17 MED ORDER — ISOSORBIDE MONONITRATE ER 30 MG PO TB24: 30.0000 mg | ORAL_TABLET | Freq: Every day | ORAL | 3 refills | Status: DC

## 2019-05-17 NOTE — Patient Instructions (Addendum)
Medication Instructions:  Your physician has recommended you make the following change in your medication:  1.  RESTART Metoprolol 25 mg taking 1 twice a day We [facility]   *If you need a refill on your cardiac medications before your next appointment, please call your pharmacy*   Lab Work: None ordered  If you have labs (blood work) drawn today and your tests are completely normal, you will receive your results only by: Marland Kitchen MyChart Message (if you have MyChart) OR . A paper copy in the mail If you have any lab test that is abnormal or we need to change your treatment, we will call you to review the results.   Testing/Procedures: None ordered   Follow-Up: At [facility], you and your health needs are our priority.  As part of our continuing mission to provide you with exceptional heart care, we have created designated Provider Care Teams.  These Care Teams include your primary Cardiologist (physician) and Advanced Practice Providers (APPs -  Physician Assistants and Nurse Practitioners) who all work together to provide you with the care you need, when you need it.  We recommend signing up for the patient portal called "MyChart".  Sign up information is provided on this After Visit Summary.  MyChart is used to connect with patients for Virtual Visits (Telemedicine).  Patients are able to view lab/test results, encounter notes, upcoming appointments, etc.  Non-urgent messages can be sent to your provider as well.   To learn more about what you can do with MyChart, go to [email or web address].    Your next appointment:   3 month(s)  The format for your next appointment:   In Person  Provider:   You may see Lesleigh Noe, MD or one of the following Advanced Practice Providers on your designated Care Team:    Norma Fredrickson, NP  Nada Boozer, NP  Georgie Chard, NP    Other Instructions

## 2019-05-21 ENCOUNTER — Encounter (HOSPITAL_COMMUNITY): Payer: Self-pay

## 2019-05-21 ENCOUNTER — Other Ambulatory Visit: Payer: Self-pay

## 2019-05-21 ENCOUNTER — Telehealth (HOSPITAL_COMMUNITY): Payer: Self-pay

## 2019-05-21 ENCOUNTER — Encounter (HOSPITAL_COMMUNITY): Payer: Self-pay | Admitting: Emergency Medicine

## 2019-05-21 ENCOUNTER — Emergency Department (HOSPITAL_COMMUNITY)
Admission: EM | Admit: 2019-05-21 | Discharge: 2019-05-22 | Disposition: A | Discharge status: Home / Self Care | Payer: Self-pay | Attending: Emergency Medicine | Admitting: Emergency Medicine

## 2019-05-21 DIAGNOSIS — Z7289 Other problems related to lifestyle: Secondary | ICD-10-CM

## 2019-05-21 DIAGNOSIS — I252 Old myocardial infarction: Secondary | ICD-10-CM | POA: Insufficient documentation

## 2019-05-21 DIAGNOSIS — Z91040 Latex allergy status: Secondary | ICD-10-CM | POA: Insufficient documentation

## 2019-05-21 DIAGNOSIS — Z87891 Personal history of nicotine dependence: Secondary | ICD-10-CM | POA: Insufficient documentation

## 2019-05-21 DIAGNOSIS — Z955 Presence of coronary angioplasty implant and graft: Secondary | ICD-10-CM | POA: Insufficient documentation

## 2019-05-21 DIAGNOSIS — R45851 Suicidal ideations: Secondary | ICD-10-CM | POA: Insufficient documentation

## 2019-05-21 DIAGNOSIS — Z20822 Contact with and (suspected) exposure to covid-19: Secondary | ICD-10-CM | POA: Insufficient documentation

## 2019-05-21 DIAGNOSIS — I2510 Atherosclerotic heart disease of native coronary artery without angina pectoris: Secondary | ICD-10-CM | POA: Insufficient documentation

## 2019-05-21 DIAGNOSIS — Z789 Other specified health status: Secondary | ICD-10-CM

## 2019-05-21 DIAGNOSIS — Z79899 Other long term (current) drug therapy: Secondary | ICD-10-CM | POA: Insufficient documentation

## 2019-05-21 DIAGNOSIS — I509 Heart failure, unspecified: Secondary | ICD-10-CM | POA: Insufficient documentation

## 2019-05-21 DIAGNOSIS — F319 Bipolar disorder, unspecified: Secondary | ICD-10-CM | POA: Insufficient documentation

## 2019-05-21 DIAGNOSIS — I110 Hypertensive heart disease with heart failure: Secondary | ICD-10-CM | POA: Insufficient documentation

## 2019-05-21 DIAGNOSIS — Z87820 Personal history of traumatic brain injury: Secondary | ICD-10-CM | POA: Insufficient documentation

## 2019-05-21 LAB — CBC
HCT: 41.1 % (ref 36.0–46.0)
Hemoglobin: 12.5 g/dL (ref 12.0–15.0)
MCH: 25.9 pg — ABNORMAL LOW (ref 26.0–34.0)
MCHC: 30.4 g/dL (ref 30.0–36.0)
MCV: 85.3 fL (ref 80.0–100.0)
Platelets: 373 10*3/uL (ref 150–400)
RBC: 4.82 MIL/uL (ref 3.87–5.11)
RDW: 17.7 % — ABNORMAL HIGH (ref 11.5–15.5)
WBC: 6.8 10*3/uL (ref 4.0–10.5)
nRBC: 0 % (ref 0.0–0.2)

## 2019-05-21 LAB — SALICYLATE LEVEL: Salicylate Lvl: <7 mg/dL — ABNORMAL LOW (ref 7.0–30.0)

## 2019-05-21 LAB — COMPREHENSIVE METABOLIC PANEL
ALT: 13 U/L (ref 0–44)
AST: 17 U/L (ref 15–41)
Albumin: 3.9 g/dL (ref 3.5–5.0)
Alkaline Phosphatase: 81 U/L (ref 38–126)
Anion gap: 14 (ref 5–15)
BUN: 14 mg/dL (ref 6–20)
CO2: 20 mmol/L — ABNORMAL LOW (ref 22–32)
Calcium: 8.8 mg/dL — ABNORMAL LOW (ref 8.9–10.3)
Chloride: 108 mmol/L (ref 98–111)
Creatinine, Ser: 1.03 mg/dL — ABNORMAL HIGH (ref 0.44–1.00)
GFR calc Af Amer: >60 mL/min (ref >60)
GFR calc non Af Amer: >60 mL/min (ref >60)
Glucose, Bld: 92 mg/dL (ref 70–99)
Potassium: 3.8 mmol/L (ref 3.5–5.1)
Sodium: 142 mmol/L (ref 135–145)
Total Bilirubin: 0.1 mg/dL — ABNORMAL LOW (ref 0.3–1.2)
Total Protein: 7.8 g/dL (ref 6.5–8.1)

## 2019-05-21 LAB — RAPID URINE DRUG SCREEN, HOSP PERFORMED
Amphetamines: NOT DETECTED
Barbiturates: NOT DETECTED
Benzodiazepines: NOT DETECTED
Cocaine: NOT DETECTED
Opiates: NOT DETECTED
Tetrahydrocannabinol: NOT DETECTED

## 2019-05-21 LAB — HCG, SERUM, QUALITATIVE: Preg, Serum: NEGATIVE

## 2019-05-21 LAB — ACETAMINOPHEN LEVEL: Acetaminophen (Tylenol), Serum: <10 ug/mL — ABNORMAL LOW (ref 10–30)

## 2019-05-21 LAB — ETHANOL: Alcohol, Ethyl (B): <10 mg/dL (ref <10)

## 2019-05-21 LAB — I-STAT BETA HCG BLOOD, ED (MC, WL, AP ONLY): I-stat hCG, quantitative: 10.5 m[IU]/mL — ABNORMAL HIGH (ref <5)

## 2019-05-21 MED ORDER — ROSUVASTATIN CALCIUM 20 MG PO TABS
40.0000 mg | ORAL_TABLET | Freq: Every day | ORAL | Status: DC
  Administered 2019-05-22: 40 mg via ORAL
  Filled 2019-05-21: qty 2

## 2019-05-21 MED ORDER — ASPIRIN 81 MG PO CHEW
81.0000 mg | CHEWABLE_TABLET | Freq: Every day | ORAL | Status: DC
  Administered 2019-05-21 – 2019-05-22 (×2): 81 mg via ORAL
  Filled 2019-05-21 (×2): qty 1

## 2019-05-21 MED ORDER — LORAZEPAM 2 MG/ML IJ SOLN: 0.0000 mg | Freq: Two times a day (BID) | INTRAMUSCULAR | Status: DC

## 2019-05-21 MED ORDER — THIAMINE HCL 100 MG/ML IJ SOLN: 100.0000 mg | Freq: Every day | INTRAMUSCULAR | Status: DC

## 2019-05-21 MED ORDER — METOPROLOL TARTRATE 25 MG PO TABS
25.0000 mg | ORAL_TABLET | Freq: Two times a day (BID) | ORAL | Status: DC
  Administered 2019-05-22: 25 mg via ORAL
  Filled 2019-05-21: qty 1

## 2019-05-21 MED ORDER — LOSARTAN POTASSIUM 25 MG PO TABS
12.5000 mg | ORAL_TABLET | Freq: Every day | ORAL | Status: DC
  Administered 2019-05-22: 12.5 mg via ORAL
  Filled 2019-05-21: qty 0.5

## 2019-05-21 MED ORDER — FUROSEMIDE 20 MG PO TABS
20.0000 mg | ORAL_TABLET | ORAL | Status: DC | PRN
  Filled 2019-05-21: qty 1

## 2019-05-21 MED ORDER — ESCITALOPRAM OXALATE 10 MG PO TABS
20.0000 mg | ORAL_TABLET | Freq: Every day | ORAL | Status: DC
  Administered 2019-05-21 – 2019-05-22 (×2): 20 mg via ORAL
  Filled 2019-05-21 (×2): qty 2

## 2019-05-21 MED ORDER — GABAPENTIN 300 MG PO CAPS
300.0000 mg | ORAL_CAPSULE | Freq: Three times a day (TID) | ORAL | Status: DC
  Administered 2019-05-21 – 2019-05-22 (×2): 300 mg via ORAL
  Filled 2019-05-21 (×2): qty 1

## 2019-05-21 MED ORDER — QUETIAPINE FUMARATE 100 MG PO TABS
100.0000 mg | ORAL_TABLET | Freq: Every day | ORAL | Status: DC
  Administered 2019-05-21: 100 mg via ORAL
  Filled 2019-05-21: qty 1

## 2019-05-21 MED ORDER — PANTOPRAZOLE SODIUM 40 MG PO TBEC
40.0000 mg | DELAYED_RELEASE_TABLET | Freq: Every day | ORAL | Status: DC
  Administered 2019-05-21 – 2019-05-22 (×2): 40 mg via ORAL
  Filled 2019-05-21 (×2): qty 1

## 2019-05-21 MED ORDER — LORAZEPAM 2 MG/ML IJ SOLN: 0.0000 mg | Freq: Four times a day (QID) | INTRAMUSCULAR | Status: DC

## 2019-05-21 MED ORDER — LORAZEPAM 0.5 MG PO TABS
0.5000 mg | ORAL_TABLET | Freq: Every day | ORAL | Status: DC | PRN
  Administered 2019-05-21: 0.5 mg via ORAL
  Filled 2019-05-21: qty 1

## 2019-05-21 MED ORDER — AMLODIPINE BESYLATE 5 MG PO TABS
5.0000 mg | ORAL_TABLET | Freq: Every day | ORAL | Status: DC
  Administered 2019-05-21 – 2019-05-22 (×2): 5 mg via ORAL
  Filled 2019-05-21 (×2): qty 1

## 2019-05-21 MED ORDER — TICAGRELOR 90 MG PO TABS
90.0000 mg | ORAL_TABLET | Freq: Two times a day (BID) | ORAL | Status: DC
  Administered 2019-05-21 – 2019-05-22 (×2): 90 mg via ORAL
  Filled 2019-05-21 (×4): qty 1

## 2019-05-21 MED ORDER — ATOMOXETINE HCL 40 MG PO CAPS
40.0000 mg | ORAL_CAPSULE | Freq: Every morning | ORAL | Status: DC
  Administered 2019-05-22: 40 mg via ORAL
  Filled 2019-05-21: qty 1

## 2019-05-21 MED ORDER — EZETIMIBE 10 MG PO TABS
10.0000 mg | ORAL_TABLET | Freq: Every day | ORAL | Status: DC
  Administered 2019-05-22: 10 mg via ORAL
  Filled 2019-05-21: qty 1

## 2019-05-21 MED ORDER — ALBUTEROL SULFATE HFA 108 (90 BASE) MCG/ACT IN AERS: 2.0000 | INHALATION_SPRAY | Freq: Four times a day (QID) | RESPIRATORY_TRACT | Status: DC | PRN

## 2019-05-21 MED ORDER — ISOSORBIDE MONONITRATE ER 30 MG PO TB24
30.0000 mg | ORAL_TABLET | Freq: Every day | ORAL | Status: DC
  Administered 2019-05-21 – 2019-05-22 (×2): 30 mg via ORAL
  Filled 2019-05-21 (×3): qty 1

## 2019-05-21 MED ORDER — LORAZEPAM 1 MG PO TABS: 0.0000 mg | ORAL_TABLET | Freq: Four times a day (QID) | ORAL | Status: DC

## 2019-05-21 MED ORDER — THIAMINE HCL 100 MG PO TABS
100.0000 mg | ORAL_TABLET | Freq: Every day | ORAL | Status: DC
  Administered 2019-05-21 – 2019-05-22 (×2): 100 mg via ORAL
  Filled 2019-05-21 (×2): qty 1

## 2019-05-21 MED ORDER — BUSPIRONE HCL 10 MG PO TABS
10.0000 mg | ORAL_TABLET | Freq: Three times a day (TID) | ORAL | Status: DC
  Administered 2019-05-21 – 2019-05-22 (×2): 10 mg via ORAL
  Filled 2019-05-21 (×2): qty 1

## 2019-05-21 MED ORDER — LORAZEPAM 1 MG PO TABS: 0.0000 mg | ORAL_TABLET | Freq: Two times a day (BID) | ORAL | Status: DC

## 2019-05-21 NOTE — ED Triage Notes (Addendum)
Patient here from home reporting suicidal ideation x1 week. Reports that she has been drinking alcohol constantly x1 week. Does not have a plan but "I just want to drink, drink, drink and hurt myself. States that she has a hx of alcohol abuse and bipolar disorder. Last drink ago.

## 2019-05-21 NOTE — Telephone Encounter (Signed)
Attempted to call patient in regards to Cardiac Rehab - LM on VM Mailed letter 

## 2019-05-21 NOTE — ED Notes (Signed)
Asked phlebotomy to redraw pt's labs.
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
## 2019-05-22 LAB — RESPIRATORY PANEL BY RT PCR (FLU A&B, COVID)
Influenza A by PCR: NEGATIVE
Influenza B by PCR: NEGATIVE
SARS Coronavirus 2 by RT PCR: NEGATIVE

## 2019-05-22 MED ORDER — QUETIAPINE FUMARATE 25 MG PO TABS
25.0000 mg | ORAL_TABLET | Freq: Two times a day (BID) | ORAL | Status: DC
  Administered 2019-05-22: 25 mg via ORAL
  Filled 2019-05-22: qty 1

## 2019-05-22 MED ORDER — QUETIAPINE FUMARATE 25 MG PO TABS: 25.0000 mg | ORAL_TABLET | Freq: Two times a day (BID) | ORAL | 0 refills | Status: DC

## 2019-05-22 NOTE — BH Assessment (Signed)
[facility] Assessment Progress Note  Per Shuvon Rankin, FNP, this pt does not require psychiatric hospitalization at this time.  Pt presents under IVC initiated by EDP Arby Barrette, MD, which has been rescinded by Nelly Rout, MD.  Pt is to be discharged from [facility] with recommendation to continue with pt's current provider at [facility].  This has been included in pt's discharge instructions.  Pt's nurse, Waynetta Sandy, has been notified.  Doylene Canning, MA Triage Specialist [phone]

## 2019-05-22 NOTE — ED Notes (Signed)
DCd off unit to home per provider. Pt alert, calm, cooperative, no s.s of distress. DC information given to and reviewed with pt, pt acknowledge understanding. Pt ambulatory off unit, escorted by [facility]. Pt has car for transportation.,
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
## 2019-05-22 NOTE — Discharge Instructions (Signed)
For your behavioral health needs, you are advised to continue treatment with your current outpatient provider.  Plan to keep your next scheduled appointment:       [facility]      [address]      [place], [place] [address]      [phone]

## 2019-06-05 ENCOUNTER — Telehealth (HOSPITAL_COMMUNITY): Payer: Self-pay

## 2019-06-05 NOTE — Telephone Encounter (Signed)
Unable to reach pt in regards to cardiac rehab. °Closed referral. °

## 2019-06-25 ENCOUNTER — Other Ambulatory Visit: Payer: Self-pay

## 2019-06-25 ENCOUNTER — Emergency Department (HOSPITAL_COMMUNITY)
Admission: EM | Admit: 2019-06-25 | Discharge: 2019-06-25 | Disposition: A | Discharge status: Home / Self Care | Payer: Self-pay | Attending: Emergency Medicine | Admitting: Emergency Medicine

## 2019-06-25 ENCOUNTER — Encounter (HOSPITAL_COMMUNITY): Payer: Self-pay | Admitting: Emergency Medicine

## 2019-06-25 ENCOUNTER — Emergency Department (HOSPITAL_COMMUNITY): Payer: Self-pay

## 2019-06-25 DIAGNOSIS — I2510 Atherosclerotic heart disease of native coronary artery without angina pectoris: Secondary | ICD-10-CM | POA: Insufficient documentation

## 2019-06-25 DIAGNOSIS — Z79899 Other long term (current) drug therapy: Secondary | ICD-10-CM | POA: Insufficient documentation

## 2019-06-25 DIAGNOSIS — Z7902 Long term (current) use of antithrombotics/antiplatelets: Secondary | ICD-10-CM | POA: Insufficient documentation

## 2019-06-25 DIAGNOSIS — R079 Chest pain, unspecified: Secondary | ICD-10-CM

## 2019-06-25 DIAGNOSIS — Z951 Presence of aortocoronary bypass graft: Secondary | ICD-10-CM | POA: Insufficient documentation

## 2019-06-25 DIAGNOSIS — I5032 Chronic diastolic (congestive) heart failure: Secondary | ICD-10-CM | POA: Insufficient documentation

## 2019-06-25 DIAGNOSIS — R0789 Other chest pain: Secondary | ICD-10-CM | POA: Insufficient documentation

## 2019-06-25 DIAGNOSIS — I110 Hypertensive heart disease with heart failure: Secondary | ICD-10-CM | POA: Insufficient documentation

## 2019-06-25 LAB — CBC
HCT: 46.6 % — ABNORMAL HIGH (ref 36.0–46.0)
Hemoglobin: 14.4 g/dL (ref 12.0–15.0)
MCH: 26.2 pg (ref 26.0–34.0)
MCHC: 30.9 g/dL (ref 30.0–36.0)
MCV: 84.9 fL (ref 80.0–100.0)
Platelets: 327 10*3/uL (ref 150–400)
RBC: 5.49 MIL/uL — ABNORMAL HIGH (ref 3.87–5.11)
RDW: 17 % — ABNORMAL HIGH (ref 11.5–15.5)
WBC: 4.5 10*3/uL (ref 4.0–10.5)
nRBC: 0 % (ref 0.0–0.2)

## 2019-06-25 LAB — BASIC METABOLIC PANEL
Anion gap: 16 — ABNORMAL HIGH (ref 5–15)
BUN: 9 mg/dL (ref 6–20)
CO2: 22 mmol/L (ref 22–32)
Calcium: 10 mg/dL (ref 8.9–10.3)
Chloride: 104 mmol/L (ref 98–111)
Creatinine, Ser: 0.78 mg/dL (ref 0.44–1.00)
GFR calc Af Amer: >60 mL/min (ref >60)
GFR calc non Af Amer: >60 mL/min (ref >60)
Glucose, Bld: 89 mg/dL (ref 70–99)
Potassium: 4.1 mmol/L (ref 3.5–5.1)
Sodium: 142 mmol/L (ref 135–145)

## 2019-06-25 LAB — TROPONIN I (HIGH SENSITIVITY)
Troponin I (High Sensitivity): 19 ng/L — ABNORMAL HIGH (ref <18)
Troponin I (High Sensitivity): 20 ng/L — ABNORMAL HIGH (ref <18)

## 2019-06-25 LAB — HEPATIC FUNCTION PANEL
ALT: 15 U/L (ref 0–44)
AST: 20 U/L (ref 15–41)
Albumin: 3.5 g/dL (ref 3.5–5.0)
Alkaline Phosphatase: 81 U/L (ref 38–126)
Bilirubin, Direct: 0.1 mg/dL (ref 0.0–0.2)
Indirect Bilirubin: 0.4 mg/dL (ref 0.3–0.9)
Total Bilirubin: 0.5 mg/dL (ref 0.3–1.2)
Total Protein: 7.2 g/dL (ref 6.5–8.1)

## 2019-06-25 LAB — LIPASE, BLOOD: Lipase: 33 U/L (ref 11–51)

## 2019-06-25 MED ORDER — NITROGLYCERIN 0.4 MG SL SUBL
0.4000 mg | SUBLINGUAL_TABLET | SUBLINGUAL | Status: DC | PRN
  Administered 2019-06-25: 0.4 mg via SUBLINGUAL
  Filled 2019-06-25: qty 1

## 2019-06-25 MED ORDER — SODIUM CHLORIDE 0.9% FLUSH: 3.0000 mL | Freq: Once | INTRAVENOUS | Status: DC

## 2019-06-25 MED ORDER — ASPIRIN 81 MG PO CHEW
324.0000 mg | CHEWABLE_TABLET | Freq: Once | ORAL | Status: AC
  Administered 2019-06-25: 324 mg via ORAL
  Filled 2019-06-25: qty 4

## 2019-06-25 MED ORDER — ISOSORBIDE MONONITRATE ER 30 MG PO TB24: 60.0000 mg | ORAL_TABLET | Freq: Every day | ORAL | 3 refills | Status: DC
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
## 2019-06-25 NOTE — ED Notes (Signed)
Patient given discharge instructions patient verbalizes understanding. 

## 2019-06-25 NOTE — Discharge Instructions (Signed)
Increase your isosorbide mononitrate (IMDUR) to 60 mg daily.  (2 tablets of the 30 mg ) follow-up with your doctor in the cardiology office to be rechecked.  They should call to schedule an appointment.  Return as needed for worsening symptoms

## 2019-07-06 ENCOUNTER — Other Ambulatory Visit: Payer: Self-pay

## 2019-07-06 ENCOUNTER — Encounter (HOSPITAL_COMMUNITY)
Admission: EM | Disposition: A | Discharge status: Home / Self Care | Payer: Self-pay | Source: Home / Self Care | Attending: Cardiovascular Disease

## 2019-07-06 ENCOUNTER — Encounter (HOSPITAL_COMMUNITY): Payer: Self-pay | Admitting: Emergency Medicine

## 2019-07-06 ENCOUNTER — Emergency Department (HOSPITAL_COMMUNITY): Payer: Self-pay

## 2019-07-06 ENCOUNTER — Telehealth: Payer: Self-pay | Admitting: Interventional Cardiology

## 2019-07-06 ENCOUNTER — Inpatient Hospital Stay (HOSPITAL_COMMUNITY)
Admission: EM | Admit: 2019-07-06 | Discharge: 2019-07-08 | DRG: 247 | Disposition: A | Discharge status: Home / Self Care | Payer: Self-pay | Attending: Cardiology | Admitting: Cardiology

## 2019-07-06 DIAGNOSIS — S069X9A Unspecified intracranial injury with loss of consciousness of unspecified duration, initial encounter: Secondary | ICD-10-CM | POA: Diagnosis present

## 2019-07-06 DIAGNOSIS — Z720 Tobacco use: Secondary | ICD-10-CM | POA: Diagnosis present

## 2019-07-06 DIAGNOSIS — Z801 Family history of malignant neoplasm of trachea, bronchus and lung: Secondary | ICD-10-CM

## 2019-07-06 DIAGNOSIS — I2510 Atherosclerotic heart disease of native coronary artery without angina pectoris: Secondary | ICD-10-CM | POA: Diagnosis present

## 2019-07-06 DIAGNOSIS — I25810 Atherosclerosis of coronary artery bypass graft(s) without angina pectoris: Secondary | ICD-10-CM

## 2019-07-06 DIAGNOSIS — Z951 Presence of aortocoronary bypass graft: Secondary | ICD-10-CM

## 2019-07-06 DIAGNOSIS — Z91120 Patient's intentional underdosing of medication regimen due to financial hardship: Secondary | ICD-10-CM

## 2019-07-06 DIAGNOSIS — Z87891 Personal history of nicotine dependence: Secondary | ICD-10-CM

## 2019-07-06 DIAGNOSIS — Z87442 Personal history of urinary calculi: Secondary | ICD-10-CM

## 2019-07-06 DIAGNOSIS — F1110 Opioid abuse, uncomplicated: Secondary | ICD-10-CM | POA: Diagnosis present

## 2019-07-06 DIAGNOSIS — I5032 Chronic diastolic (congestive) heart failure: Secondary | ICD-10-CM | POA: Diagnosis present

## 2019-07-06 DIAGNOSIS — F1310 Sedative, hypnotic or anxiolytic abuse, uncomplicated: Secondary | ICD-10-CM | POA: Diagnosis present

## 2019-07-06 DIAGNOSIS — Z8674 Personal history of sudden cardiac arrest: Secondary | ICD-10-CM

## 2019-07-06 DIAGNOSIS — Y92009 Unspecified place in unspecified non-institutional (private) residence as the place of occurrence of the external cause: Secondary | ICD-10-CM

## 2019-07-06 DIAGNOSIS — Z981 Arthrodesis status: Secondary | ICD-10-CM

## 2019-07-06 DIAGNOSIS — I214 Non-ST elevation (NSTEMI) myocardial infarction: Principal | ICD-10-CM

## 2019-07-06 DIAGNOSIS — E8881 Metabolic syndrome: Secondary | ICD-10-CM | POA: Diagnosis present

## 2019-07-06 DIAGNOSIS — Z79899 Other long term (current) drug therapy: Secondary | ICD-10-CM

## 2019-07-06 DIAGNOSIS — Z6835 Body mass index (BMI) 35.0-35.9, adult: Secondary | ICD-10-CM

## 2019-07-06 DIAGNOSIS — F419 Anxiety disorder, unspecified: Secondary | ICD-10-CM | POA: Diagnosis present

## 2019-07-06 DIAGNOSIS — E669 Obesity, unspecified: Secondary | ICD-10-CM | POA: Diagnosis present

## 2019-07-06 DIAGNOSIS — F319 Bipolar disorder, unspecified: Secondary | ICD-10-CM | POA: Diagnosis present

## 2019-07-06 DIAGNOSIS — Z955 Presence of coronary angioplasty implant and graft: Secondary | ICD-10-CM

## 2019-07-06 DIAGNOSIS — Z885 Allergy status to narcotic agent status: Secondary | ICD-10-CM

## 2019-07-06 DIAGNOSIS — I252 Old myocardial infarction: Secondary | ICD-10-CM

## 2019-07-06 DIAGNOSIS — Z7982 Long term (current) use of aspirin: Secondary | ICD-10-CM

## 2019-07-06 DIAGNOSIS — E785 Hyperlipidemia, unspecified: Secondary | ICD-10-CM | POA: Diagnosis present

## 2019-07-06 DIAGNOSIS — K219 Gastro-esophageal reflux disease without esophagitis: Secondary | ICD-10-CM | POA: Diagnosis present

## 2019-07-06 DIAGNOSIS — I25110 Atherosclerotic heart disease of native coronary artery with unstable angina pectoris: Secondary | ICD-10-CM

## 2019-07-06 DIAGNOSIS — Z888 Allergy status to other drugs, medicaments and biological substances status: Secondary | ICD-10-CM

## 2019-07-06 DIAGNOSIS — Z91040 Latex allergy status: Secondary | ICD-10-CM

## 2019-07-06 DIAGNOSIS — Z20822 Contact with and (suspected) exposure to covid-19: Secondary | ICD-10-CM | POA: Diagnosis present

## 2019-07-06 DIAGNOSIS — Z803 Family history of malignant neoplasm of breast: Secondary | ICD-10-CM

## 2019-07-06 DIAGNOSIS — R079 Chest pain, unspecified: Secondary | ICD-10-CM | POA: Diagnosis present

## 2019-07-06 DIAGNOSIS — F1010 Alcohol abuse, uncomplicated: Secondary | ICD-10-CM | POA: Diagnosis present

## 2019-07-06 DIAGNOSIS — Z8249 Family history of ischemic heart disease and other diseases of the circulatory system: Secondary | ICD-10-CM

## 2019-07-06 DIAGNOSIS — Z818 Family history of other mental and behavioral disorders: Secondary | ICD-10-CM

## 2019-07-06 DIAGNOSIS — I10 Essential (primary) hypertension: Secondary | ICD-10-CM | POA: Diagnosis present

## 2019-07-06 DIAGNOSIS — F329 Major depressive disorder, single episode, unspecified: Secondary | ICD-10-CM | POA: Diagnosis present

## 2019-07-06 DIAGNOSIS — E782 Mixed hyperlipidemia: Secondary | ICD-10-CM | POA: Diagnosis present

## 2019-07-06 DIAGNOSIS — I110 Hypertensive heart disease with heart failure: Secondary | ICD-10-CM | POA: Diagnosis present

## 2019-07-06 DIAGNOSIS — G4733 Obstructive sleep apnea (adult) (pediatric): Secondary | ICD-10-CM | POA: Diagnosis present

## 2019-07-06 DIAGNOSIS — Z9114 Patient's other noncompliance with medication regimen: Secondary | ICD-10-CM

## 2019-07-06 DIAGNOSIS — I493 Ventricular premature depolarization: Secondary | ICD-10-CM | POA: Diagnosis present

## 2019-07-06 DIAGNOSIS — T50996A Underdosing of other drugs, medicaments and biological substances, initial encounter: Secondary | ICD-10-CM | POA: Diagnosis present

## 2019-07-06 HISTORY — PX: LEFT HEART CATH AND CORS/GRAFTS ANGIOGRAPHY: CATH118250

## 2019-07-06 HISTORY — PX: CORONARY BALLOON ANGIOPLASTY: CATH118233

## 2019-07-06 HISTORY — PX: CORONARY STENT INTERVENTION: CATH118234

## 2019-07-06 LAB — BASIC METABOLIC PANEL
Anion gap: 10 (ref 5–15)
BUN: 9 mg/dL (ref 6–20)
CO2: 23 mmol/L (ref 22–32)
Calcium: 9.2 mg/dL (ref 8.9–10.3)
Chloride: 106 mmol/L (ref 98–111)
Creatinine, Ser: 0.79 mg/dL (ref 0.44–1.00)
GFR calc Af Amer: >60 mL/min (ref >60)
GFR calc non Af Amer: >60 mL/min (ref >60)
Glucose, Bld: 111 mg/dL — ABNORMAL HIGH (ref 70–99)
Potassium: 3.7 mmol/L (ref 3.5–5.1)
Sodium: 139 mmol/L (ref 135–145)

## 2019-07-06 LAB — CBC
HCT: 39.7 % (ref 36.0–46.0)
Hemoglobin: 12.2 g/dL (ref 12.0–15.0)
MCH: 26.3 pg (ref 26.0–34.0)
MCHC: 30.7 g/dL (ref 30.0–36.0)
MCV: 85.6 fL (ref 80.0–100.0)
Platelets: 276 10*3/uL (ref 150–400)
RBC: 4.64 MIL/uL (ref 3.87–5.11)
RDW: 16.6 % — ABNORMAL HIGH (ref 11.5–15.5)
WBC: 8.8 10*3/uL (ref 4.0–10.5)
nRBC: 0 % (ref 0.0–0.2)

## 2019-07-06 LAB — POCT ACTIVATED CLOTTING TIME
Activated Clotting Time: 318 seconds
Activated Clotting Time: 219 seconds
Activated Clotting Time: 219 seconds

## 2019-07-06 LAB — I-STAT BETA HCG BLOOD, ED (MC, WL, AP ONLY): I-stat hCG, quantitative: 5.1 m[IU]/mL — ABNORMAL HIGH (ref <5)

## 2019-07-06 LAB — SARS CORONAVIRUS 2 BY RT PCR (HOSPITAL ORDER, PERFORMED IN ~~LOC~~ HOSPITAL LAB): SARS Coronavirus 2: NEGATIVE

## 2019-07-06 LAB — HEMOGLOBIN A1C
Hgb A1c MFr Bld: 6.3 % — ABNORMAL HIGH (ref 4.8–5.6)
Mean Plasma Glucose: 134.11 mg/dL

## 2019-07-06 LAB — TROPONIN I (HIGH SENSITIVITY)
Troponin I (High Sensitivity): 1155 ng/L (ref <18)
Troponin I (High Sensitivity): 1323 ng/L (ref <18)

## 2019-07-06 SURGERY — LEFT HEART CATH AND CORS/GRAFTS ANGIOGRAPHY
Anesthesia: LOCAL

## 2019-07-06 MED ORDER — SODIUM CHLORIDE 0.9% FLUSH: 3.0000 mL | Freq: Once | INTRAVENOUS | Status: DC

## 2019-07-06 MED ORDER — LORAZEPAM 2 MG/ML IJ SOLN: 1.0000 mg | INTRAMUSCULAR | Status: DC | PRN

## 2019-07-06 MED ORDER — MORPHINE SULFATE (PF) 2 MG/ML IV SOLN
2.0000 mg | INTRAVENOUS | Status: DC | PRN
  Administered 2019-07-07: 2 mg via INTRAVENOUS
  Filled 2019-07-06: qty 1

## 2019-07-06 MED ORDER — TICAGRELOR 90 MG PO TABS
90.0000 mg | ORAL_TABLET | Freq: Two times a day (BID) | ORAL | Status: DC
  Administered 2019-07-07 – 2019-07-08 (×3): 90 mg via ORAL
  Filled 2019-07-06 (×3): qty 1

## 2019-07-06 MED ORDER — EZETIMIBE 10 MG PO TABS
10.0000 mg | ORAL_TABLET | Freq: Every day | ORAL | Status: DC
  Administered 2019-07-07 – 2019-07-08 (×2): 10 mg via ORAL
  Filled 2019-07-06 (×2): qty 1

## 2019-07-06 MED ORDER — LOSARTAN POTASSIUM 25 MG PO TABS
12.5000 mg | ORAL_TABLET | Freq: Every day | ORAL | Status: DC
  Administered 2019-07-07 – 2019-07-08 (×2): 12.5 mg via ORAL
  Filled 2019-07-06 (×2): qty 1

## 2019-07-06 MED ORDER — NITROGLYCERIN 1 MG/10 ML FOR IR/CATH LAB
INTRA_ARTERIAL | Status: DC | PRN
  Administered 2019-07-06 (×4): 200 ug via INTRACORONARY

## 2019-07-06 MED ORDER — IOHEXOL 350 MG/ML SOLN
INTRAVENOUS | Status: DC | PRN
  Administered 2019-07-06: 240 mL

## 2019-07-06 MED ORDER — ASPIRIN 300 MG RE SUPP: 300.0000 mg | RECTAL | Status: DC

## 2019-07-06 MED ORDER — MIDAZOLAM HCL 2 MG/2ML IJ SOLN
INTRAMUSCULAR | Status: AC
  Filled 2019-07-06: qty 2

## 2019-07-06 MED ORDER — ONDANSETRON HCL 4 MG/2ML IJ SOLN: 4.0000 mg | Freq: Four times a day (QID) | INTRAMUSCULAR | Status: DC | PRN

## 2019-07-06 MED ORDER — ASPIRIN 81 MG PO CHEW
81.0000 mg | CHEWABLE_TABLET | Freq: Every day | ORAL | Status: DC
  Administered 2019-07-07 – 2019-07-08 (×2): 81 mg via ORAL
  Filled 2019-07-06 (×2): qty 1

## 2019-07-06 MED ORDER — HEPARIN (PORCINE) IN NACL 1000-0.9 UT/500ML-% IV SOLN
INTRAVENOUS | Status: DC | PRN
  Administered 2019-07-06 (×2): 500 mL

## 2019-07-06 MED ORDER — FENTANYL CITRATE (PF) 100 MCG/2ML IJ SOLN
INTRAMUSCULAR | Status: DC | PRN
  Administered 2019-07-06 (×5): 25 ug via INTRAVENOUS

## 2019-07-06 MED ORDER — ISOSORBIDE MONONITRATE ER 60 MG PO TB24
60.0000 mg | ORAL_TABLET | Freq: Every day | ORAL | Status: DC
  Administered 2019-07-07 – 2019-07-08 (×2): 60 mg via ORAL
  Filled 2019-07-06 (×2): qty 1

## 2019-07-06 MED ORDER — PANTOPRAZOLE SODIUM 40 MG PO TBEC
40.0000 mg | DELAYED_RELEASE_TABLET | Freq: Every day | ORAL | Status: DC
  Administered 2019-07-06 – 2019-07-08 (×3): 40 mg via ORAL
  Filled 2019-07-06 (×3): qty 1

## 2019-07-06 MED ORDER — TETRACAINE HCL 1 % IJ SOLN
INTRAMUSCULAR | Status: DC | PRN
  Administered 2019-07-06: 10 mg

## 2019-07-06 MED ORDER — DIVALPROEX SODIUM ER 500 MG PO TB24
500.0000 mg | ORAL_TABLET | Freq: Three times a day (TID) | ORAL | Status: DC
  Administered 2019-07-06 – 2019-07-08 (×5): 500 mg via ORAL
  Filled 2019-07-06 (×7): qty 1

## 2019-07-06 MED ORDER — METOPROLOL TARTRATE 25 MG PO TABS
25.0000 mg | ORAL_TABLET | Freq: Two times a day (BID) | ORAL | Status: DC
  Administered 2019-07-06 – 2019-07-08 (×4): 25 mg via ORAL
  Filled 2019-07-06 (×4): qty 1

## 2019-07-06 MED ORDER — ASPIRIN 81 MG PO CHEW: 324.0000 mg | CHEWABLE_TABLET | Freq: Once | ORAL | Status: DC

## 2019-07-06 MED ORDER — MIDAZOLAM HCL 2 MG/2ML IJ SOLN
INTRAMUSCULAR | Status: DC | PRN
  Administered 2019-07-06 (×5): 1 mg via INTRAVENOUS

## 2019-07-06 MED ORDER — BUSPIRONE HCL 5 MG PO TABS
10.0000 mg | ORAL_TABLET | Freq: Three times a day (TID) | ORAL | Status: DC
  Administered 2019-07-06 – 2019-07-08 (×5): 10 mg via ORAL
  Filled 2019-07-06 (×5): qty 2

## 2019-07-06 MED ORDER — SODIUM CHLORIDE 0.9 % IV SOLN: 250.0000 mL | INTRAVENOUS | Status: DC | PRN

## 2019-07-06 MED ORDER — TICAGRELOR 90 MG PO TABS
ORAL_TABLET | ORAL | Status: DC | PRN
  Administered 2019-07-06: 180 mg via ORAL

## 2019-07-06 MED ORDER — SODIUM CHLORIDE 0.9% FLUSH: 3.0000 mL | INTRAVENOUS | Status: DC | PRN

## 2019-07-06 MED ORDER — LABETALOL HCL 5 MG/ML IV SOLN: 10.0000 mg | INTRAVENOUS | Status: AC | PRN

## 2019-07-06 MED ORDER — ACETAMINOPHEN 325 MG PO TABS
650.0000 mg | ORAL_TABLET | ORAL | Status: DC | PRN
  Administered 2019-07-07: 650 mg via ORAL
  Filled 2019-07-06: qty 2

## 2019-07-06 MED ORDER — NITROGLYCERIN 0.4 MG SL SUBL: 0.4000 mg | SUBLINGUAL_TABLET | SUBLINGUAL | Status: DC | PRN

## 2019-07-06 MED ORDER — FENTANYL CITRATE (PF) 100 MCG/2ML IJ SOLN
INTRAMUSCULAR | Status: AC
  Filled 2019-07-06: qty 2

## 2019-07-06 MED ORDER — ASPIRIN EC 81 MG PO TBEC: 81.0000 mg | DELAYED_RELEASE_TABLET | Freq: Every day | ORAL | Status: DC

## 2019-07-06 MED ORDER — ASPIRIN 81 MG PO CHEW: 324.0000 mg | CHEWABLE_TABLET | ORAL | Status: DC

## 2019-07-06 MED ORDER — HEPARIN SODIUM (PORCINE) 1000 UNIT/ML IJ SOLN
INTRAMUSCULAR | Status: AC
  Filled 2019-07-06: qty 1

## 2019-07-06 MED ORDER — NITROGLYCERIN 1 MG/10 ML FOR IR/CATH LAB
INTRA_ARTERIAL | Status: AC
  Filled 2019-07-06: qty 10

## 2019-07-06 MED ORDER — ATROPINE SULFATE 1 MG/10ML IJ SOSY
PREFILLED_SYRINGE | INTRAMUSCULAR | Status: AC
  Filled 2019-07-06: qty 10

## 2019-07-06 MED ORDER — HEPARIN SODIUM (PORCINE) 1000 UNIT/ML IJ SOLN
INTRAMUSCULAR | Status: DC | PRN
  Administered 2019-07-06: 8000 [IU] via INTRAVENOUS
  Administered 2019-07-06: 2500 [IU] via INTRAVENOUS

## 2019-07-06 MED ORDER — FENTANYL CITRATE (PF) 100 MCG/2ML IJ SOLN
50.0000 ug | Freq: Once | INTRAMUSCULAR | Status: AC
  Administered 2019-07-06: 50 ug via INTRAVENOUS
  Filled 2019-07-06: qty 2

## 2019-07-06 MED ORDER — SODIUM CHLORIDE 0.9% FLUSH
3.0000 mL | Freq: Two times a day (BID) | INTRAVENOUS | Status: DC
  Administered 2019-07-06 – 2019-07-08 (×3): 3 mL via INTRAVENOUS

## 2019-07-06 MED ORDER — TICAGRELOR 90 MG PO TABS
ORAL_TABLET | ORAL | Status: AC
  Filled 2019-07-06: qty 2

## 2019-07-06 MED ORDER — SODIUM CHLORIDE 0.9 % IV SOLN: INTRAVENOUS | Status: AC

## 2019-07-06 MED ORDER — LORAZEPAM 1 MG PO TABS
1.0000 mg | ORAL_TABLET | ORAL | Status: DC | PRN
  Administered 2019-07-06: 2 mg via ORAL
  Administered 2019-07-07: 1 mg via ORAL
  Administered 2019-07-07 – 2019-07-08 (×3): 2 mg via ORAL
  Filled 2019-07-06 (×2): qty 1
  Filled 2019-07-06 (×3): qty 2
  Filled 2019-07-06: qty 1

## 2019-07-06 MED ORDER — SODIUM CHLORIDE 0.9 % IV SOLN
INTRAVENOUS | Status: AC | PRN
  Administered 2019-07-06: 50 mL/h via INTRAVENOUS

## 2019-07-06 MED ORDER — ALBUTEROL SULFATE (2.5 MG/3ML) 0.083% IN NEBU: 2.5000 mg | INHALATION_SOLUTION | Freq: Four times a day (QID) | RESPIRATORY_TRACT | Status: DC | PRN

## 2019-07-06 MED ORDER — ESCITALOPRAM OXALATE 10 MG PO TABS
20.0000 mg | ORAL_TABLET | Freq: Every day | ORAL | Status: DC
  Administered 2019-07-06 – 2019-07-08 (×3): 20 mg via ORAL
  Filled 2019-07-06 (×3): qty 2

## 2019-07-06 MED ORDER — HEPARIN (PORCINE) 25000 UT/250ML-% IV SOLN
800.0000 [IU]/h | INTRAVENOUS | Status: DC
  Administered 2019-07-06: 800 [IU]/h via INTRAVENOUS
  Filled 2019-07-06: qty 250

## 2019-07-06 MED ORDER — HYDRALAZINE HCL 20 MG/ML IJ SOLN: 10.0000 mg | INTRAMUSCULAR | Status: AC | PRN

## 2019-07-06 MED ORDER — AMLODIPINE BESYLATE 5 MG PO TABS
5.0000 mg | ORAL_TABLET | Freq: Every day | ORAL | Status: DC
  Administered 2019-07-07 – 2019-07-08 (×2): 5 mg via ORAL
  Filled 2019-07-06 (×2): qty 1

## 2019-07-06 MED ORDER — HEPARIN (PORCINE) IN NACL 1000-0.9 UT/500ML-% IV SOLN
INTRAVENOUS | Status: AC
  Filled 2019-07-06: qty 1000

## 2019-07-06 MED ORDER — HEPARIN BOLUS VIA INFUSION
4000.0000 [IU] | Freq: Once | INTRAVENOUS | Status: AC
  Administered 2019-07-06: 4000 [IU] via INTRAVENOUS
  Filled 2019-07-06: qty 4000

## 2019-07-06 MED ORDER — LORAZEPAM 0.5 MG PO TABS: 0.5000 mg | ORAL_TABLET | Freq: Every day | ORAL | Status: DC | PRN

## 2019-07-06 SURGICAL SUPPLY — 21 items
BALLN SAPPHIRE 1.25X15 (BALLOONS) ×2
BALLN SAPPHIRE 2.0X15 (BALLOONS) ×2
BALLN SAPPHIRE ~~LOC~~ 2.5X15 (BALLOONS) ×1 IMPLANT
BALLOON SAPPHIRE 1.25X15 (BALLOONS) IMPLANT
BALLOON SAPPHIRE 2.0X15 (BALLOONS) IMPLANT
CATH INFINITI 5FR MULTPACK ANG (CATHETERS) ×1 IMPLANT
CATH VISTA GUIDE 6FR XB3.5 (CATHETERS) ×1 IMPLANT
ELECT DEFIB PAD ADLT CADENCE (PAD) ×1 IMPLANT
KIT ENCORE 26 ADVANTAGE (KITS) ×1 IMPLANT
KIT HEART LEFT (KITS) ×2 IMPLANT
PACK CARDIAC CATHETERIZATION (CUSTOM PROCEDURE TRAY) ×2 IMPLANT
SHEATH PINNACLE 5F 10CM (SHEATH) ×1 IMPLANT
SHEATH PINNACLE 6F 10CM (SHEATH) ×1 IMPLANT
SHEATH PROBE COVER 6X72 (BAG) ×1 IMPLANT
STENT RESOLUTE ONYX 2.0X18 (Permanent Stent) ×1 IMPLANT
STENT RESOLUTE ONYX 2.5X18 (Permanent Stent) ×1 IMPLANT
TRANSDUCER W/STOPCOCK (MISCELLANEOUS) ×2 IMPLANT
TUBING CIL FLEX 10 FLL-RA (TUBING) ×2 IMPLANT
WIRE EMERALD 3MM-J .035X150CM (WIRE) ×1 IMPLANT
WIRE PT2 MS 185 (WIRE) ×1 IMPLANT
WIRE RUNTHROUGH .014X180CM (WIRE) ×1 IMPLANT

## 2019-07-06 NOTE — Telephone Encounter (Signed)
Pt c/o of Chest Pain: STAT if CP now or developed within 24 hours  1. Are you having CP right now? Yes  2. Are you experiencing any other symptoms (ex. SOB, nausea, vomiting, sweating)? Headache   3. How long have you been experiencing CP? 2 hours  4. Is your CP continuous or coming and going? Continuous  5. Have you taken Nitroglycerin? Yes ?

## 2019-07-06 NOTE — ED Triage Notes (Signed)
Pt arrives via [facility] from home with c/o chest pain that started yesterday, reports pain is centrally located with no radiation, had 3 81mg  asa and 3 sl nitro with no relief of pain. Received 1 more sl nitro and 1 81 mg asa via ems. Reports she is supposed to be on brilinta but ran out of her rx. 20g L hand, EMS VSS 156/78, rr17, 61hr 95% ra.

## 2019-07-06 NOTE — Interval H&P Note (Signed)
History and Physical Interval Note:  07/06/2019 5:32 PM  [name]  has presented today for surgery, with the diagnosis of non-STEMI.  The various methods of treatment have been discussed with the patient and family. After consideration of risks, benefits and other options for treatment, the patient has consented to  Procedure(s): LEFT HEART CATH AND CORS/GRAFTS ANGIOGRAPHY (N/A)  PERCUTANEOUS CORONARY INTERVENTION   as a surgical intervention.  The patient's history has been reviewed, patient examined, no change in status, stable for surgery.  I have reviewed the patient's chart and labs.  Questions were answered to the patient's satisfaction.    This patient is medically noncompliant, has had significant amount of PCI done over the last 2 months, and now presents with yet again another non-ST elevation MI.  I discussed with her my concerns with the Brilinta nonadherence.  The idea of doing PCI in this situation is less than favorable.   Bryan Lemma

## 2019-07-06 NOTE — Telephone Encounter (Signed)
Pt has been having intermittent CP 2 days but has worsened today in the last two hours.  Has been out of Brilinta for a week and a half.  Denies SOB, blurred vision, nausea or sweating.  Does c/o a HA.  No vitals.  Advised pt to go to ER for eval.  Pt agreeable to plan.
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
## 2019-07-06 NOTE — H&P (Signed)
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 clinical deterioration. Furthermore, it is not anticipated that the patient will be medically stable for discharge from the hospital within 2 midnights of admission. The following factors support the patient status of inpatient.   " The patient's presenting symptoms include chest pain . " The worrisome physical exam findings as above. " The initial radiographic and laboratory data are worrisome because of elevated troponin. " The chronic co-morbidities include CAD, HTN, HLD.   * I certify that at the point of admission it is my clinical judgment that the patient will require inpatient hospital care spanning beyond 2 midnights from the point of admission due to high intensity of service, high risk for further deterioration and high frequency of surveillance required.*      For questions or updates, please contact [facility] Please consult [email or web address] for contact info under     Signed, Corrin ParkerCallie E Marilea Gwynne, PA-C  07/06/2019 3:25 PM
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
## 2019-07-07 ENCOUNTER — Other Ambulatory Visit: Payer: Self-pay

## 2019-07-07 LAB — LIPID PANEL
Cholesterol: 151 mg/dL (ref 0–200)
HDL: 54 mg/dL (ref >40)
LDL Cholesterol: 80 mg/dL (ref 0–99)
Total CHOL/HDL Ratio: 2.8 RATIO
Triglycerides: 83 mg/dL (ref <150)
VLDL: 17 mg/dL (ref 0–40)

## 2019-07-07 LAB — BASIC METABOLIC PANEL
Anion gap: 11 (ref 5–15)
BUN: 8 mg/dL (ref 6–20)
CO2: 22 mmol/L (ref 22–32)
Calcium: 9 mg/dL (ref 8.9–10.3)
Chloride: 106 mmol/L (ref 98–111)
Creatinine, Ser: 0.86 mg/dL (ref 0.44–1.00)
GFR calc Af Amer: >60 mL/min (ref >60)
GFR calc non Af Amer: >60 mL/min (ref >60)
Glucose, Bld: 125 mg/dL — ABNORMAL HIGH (ref 70–99)
Potassium: 4.1 mmol/L (ref 3.5–5.1)
Sodium: 139 mmol/L (ref 135–145)

## 2019-07-07 LAB — CBC
HCT: 40.6 % (ref 36.0–46.0)
Hemoglobin: 12.4 g/dL (ref 12.0–15.0)
MCH: 26.3 pg (ref 26.0–34.0)
MCHC: 30.5 g/dL (ref 30.0–36.0)
MCV: 86 fL (ref 80.0–100.0)
Platelets: 264 10*3/uL (ref 150–400)
RBC: 4.72 MIL/uL (ref 3.87–5.11)
RDW: 16.9 % — ABNORMAL HIGH (ref 11.5–15.5)
WBC: 10.2 10*3/uL (ref 4.0–10.5)
nRBC: 0 % (ref 0.0–0.2)

## 2019-07-07 LAB — URINALYSIS, ROUTINE W REFLEX MICROSCOPIC
Bilirubin Urine: NEGATIVE
Glucose, UA: NEGATIVE mg/dL
Hgb urine dipstick: NEGATIVE
Ketones, ur: 20 mg/dL — AB
Nitrite: NEGATIVE
Protein, ur: NEGATIVE mg/dL
Specific Gravity, Urine: >1.046 — ABNORMAL HIGH (ref 1.005–1.030)
pH: 6 (ref 5.0–8.0)

## 2019-07-07 LAB — POCT ACTIVATED CLOTTING TIME: Activated Clotting Time: 147 seconds

## 2019-07-07 MED ORDER — ROSUVASTATIN CALCIUM 20 MG PO TABS
40.0000 mg | ORAL_TABLET | Freq: Every day | ORAL | Status: DC
  Administered 2019-07-07 – 2019-07-08 (×2): 40 mg via ORAL
  Filled 2019-07-07 (×2): qty 2

## 2019-07-07 MED ORDER — ENOXAPARIN SODIUM 40 MG/0.4ML ~~LOC~~ SOLN
40.0000 mg | SUBCUTANEOUS | Status: DC
  Administered 2019-07-07: 40 mg via SUBCUTANEOUS
  Filled 2019-07-07: qty 0.4

## 2019-07-07 NOTE — Progress Notes (Signed)
CARDIAC REHAB PHASE I   PRE:  Rate/Rhythm: 85 NSR    BP: sitting na    SaO2: na  MODE:  Ambulation: refused   POST:  Rate/Rhythm: na    BP: sitting na     SaO2: na  [id number] Patient sleeping upon room entry. Attempted to ambulate patient however refused secondary to wanting breakfast prior. Agreed to discharge education however patient unable to stay awake. CWIA protocol noted. MI booklet and stent cards provided. Patient has had multiple educations sessions as she often presents with MI/cornary occlusions. Cardiology provider at bedside. Patient insist on remaining on brillinta although unable to recently afford. Provider discussed plavix with patient however patient refused stating brillinta is best. Noncompliance with self health management documented. Referral placed to CR per patient request however have had unsuccessful attempts with enrollment in past. Patient ready for D/C from CR standpoint.  Kortni Hasten Hoover Brunette RN, BSN

## 2019-07-07 NOTE — Progress Notes (Signed)
Progress Note  Patient Name: [name] Date of Encounter: 07/07/2019  Primary Cardiologist: Lesleigh Noe, MD   Subjective   No CP or dyspnea  Inpatient Medications    Scheduled Meds: . amLODipine  5 mg Oral Daily  . aspirin  324 mg Oral NOW   Or  . aspirin  300 mg Rectal NOW  . aspirin  81 mg Oral Daily  . busPIRone  10 mg Oral TID  . divalproex  500 mg Oral TID  . escitalopram  20 mg Oral Daily  . ezetimibe  10 mg Oral Daily  . isosorbide mononitrate  60 mg Oral Daily  . losartan  12.5 mg Oral Daily  . metoprolol tartrate  25 mg Oral BID  . pantoprazole  40 mg Oral Daily  . sodium chloride flush  3 mL Intravenous Once  . sodium chloride flush  3 mL Intravenous Q12H  . ticagrelor  90 mg Oral BID   Continuous Infusions: . sodium chloride     PRN Meds: sodium chloride, acetaminophen, albuterol, LORazepam **OR** LORazepam, morphine injection, nitroGLYCERIN, ondansetron (ZOFRAN) IV, sodium chloride flush   Vital Signs    Vitals:   07/07/19 0310 07/07/19 0340 07/07/19 0407 07/07/19 0610  BP: 118/63 123/61 118/68 107/77  Pulse:      Resp: (!) 25 (!) 24 (!) 24 (!) 21  Temp:   98.8 F (37.1 C)   TempSrc:   Oral   SpO2: 96% 96% 96% 95%  Weight:      Height:        Intake/Output Summary (Last 24 hours) at 07/07/2019 0842 Last data filed at 07/07/2019 0500 Gross per 24 hour  Intake 300 ml  Output 900 ml  Net -600 ml   Last 3 Weights 07/06/2019 06/25/2019 05/17/2019  Weight (lbs) 186 lb 180 lb 180 lb 12.8 oz  Weight (kg) 84.369 kg 81.647 kg 82.01 kg  Some encounter information is confidential and restricted. Go to Review Flowsheets activity to see all data.      Telemetry    Sinus- Personally Reviewed  Physical Exam   GEN: No acute distress.   Neck: No JVD Cardiac: RRR, no murmurs, rubs, or gallops.  Respiratory: Clear to auscultation bilaterally. GI: Soft, nontender, non-distended  MS: No edema; right groin with no hematoma and no bruit Neuro:   Nonfocal  Psych: Normal affect   Labs    High Sensitivity Troponin:   Recent Labs  Lab 06/25/19 1436 06/25/19 1618 07/06/19 1048 07/06/19 1514  TROPONINIHS 19* 20* 1,155* 1,323*      Chemistry Recent Labs  Lab 07/06/19 1048 07/07/19 0214  NA 139 139  K 3.7 4.1  CL 106 106  CO2 23 22  GLUCOSE 111* 125*  BUN 9 8  CREATININE 0.79 0.86  CALCIUM 9.2 9.0  GFRNONAA >60 >60  GFRAA >60 >60  ANIONGAP 10 11     Hematology Recent Labs  Lab 07/06/19 1048 07/07/19 0214  WBC 8.8 10.2  RBC 4.64 4.72  HGB 12.2 12.4  HCT 39.7 40.6  MCV 85.6 86.0  MCH 26.3 26.3  MCHC 30.7 30.5  RDW 16.6* 16.9*  PLT 276 264     Radiology    DG Chest 2 View  Result Date: 07/06/2019 CLINICAL DATA:  Chest pain, central chest pain and shortness of breath EXAM: CHEST - 2 VIEW COMPARISON:  06/25/2019 FINDINGS: Cardiomediastinal contours and hilar structures are stable. Heart size remains enlarged. Lung volumes slightly diminished compared to previous exam.  Findings of median sternotomy and CABG. Obscured partially LEFT hemidiaphragmatic contour of the medial aspect. No sign of pleural effusion. Visualized skeletal structures are unremarkable the extent evaluated. IMPRESSION: Low lung volumes with streaky opacities in the medial LEFT lung base, atelectasis versus infiltrate. Electronically Signed   By: Zetta Bills M.D.   On: 07/06/2019 11:03   CARDIAC CATHETERIZATION  Result Date: 07/06/2019  CULPRIT LESION LPAV-1 lesion is 100% stenosed -at the ostium involving the stent in distal LCx-OM2.  A drug-eluting stent was successfully placed (from the LCx stent through stent struts into the AV groove circumflex, using a STENT RESOLUTE ONYX 2.5X18 -postdilated 2.6 mm  Post intervention, there is a 0% residual stenosis.  -------  LPAV-2ND lesion is 85% stenosed-noted after stent placement at the ostial lesion.  A drug-eluting stent was successfully placed overlapping the initial stent, using a STENT  RESOLUTE ONYX 2.0X18 -postdilated in tapered fashion from 2.6 to 2.2 mm  Post intervention, there is a 0% residual stenosis  -----------  Mid Cx-OM2 lesion is 95% stenosed within the stent at the takeoff of AV groove circumflex.  Balloon angioplasty was performed using a BALLOON SAPPHIRE 2.0X15 -prior to stent placement through existing stent into AV groove circumflex.  Post intervention, there is a 100% residual stenosis -Unable to rewire. (Was relatively small distribution OM 2 previously grafted)  ------------------------------------------------  Prox LAD lesion is 50% stenosed.  1st Diag lesion is 80% stenosed - & Mid LAD-2 lesion is 40% stenosed at this side branch..  SVG-Diag graft was visualized by angiography and is small. Origin lesion is 40% stenosed.  Mid LAD DES stent is widely patent.  LIMA graft was visualized by angiography and is moderate in size, and very tortuous. There is competitive flow. Dist Graft to Insertion lesion is 95% stenosed.  Dist LAD DES stent is widely patent -crosses LIMA insertion site.  Dist LAD-3 lesion is 65% stenosed -beyond stent  1st Mrg lesion is 50% stenosed. Large bifurcating vessel  Dist Cx/OM 2 lesion is 100% stenosed-at graft anastomosis site  Ost RCA to Prox RCA stent is 5% stenosed. Mid RCA to Dist RCA lesion is 40% stenosed.  RPDA lesion is 95% stenosed. Dist RCA lesion is 100% stenosed.  Previously placed Prox RCA drug eluting stent is widely patent.  Previously placed Mid RCA-1 drug eluting stent is widely patent.  Previously placed Mid RCA-2 drug eluting stent is widely patent.  KNOWN OCCLUDED SVG-rPDA & OM2  --------------------  The left ventricular systolic function is normal. The left ventricular ejection fraction is 50-55% by visual estimate.  LV end diastolic pressure is normal.  Prox Cx to Mid Cx lesion is 50% stenosed.     Patient Profile     54 y.o. female with past medical history of coronary artery disease status post  coronary artery bypass and graft December 2020, follow-up catheterization due to non-ST elevation myocardial infarction in March with drug-eluting stents to the proximal and mid/distal LAD as well as PCI of RCA, hypertension, hyperlipidemia, bipolar disorder admitted with non-ST elevation myocardial infarction.  Patient has been noncompliant with Brilinta.  Assessment & Plan    1 non-ST elevation myocardial infarction-patient is now status post PCI of LPAV1, LPAV2 with DESs and PTCA of Lcx (unsuccessful).  We will continue aspirin, Brilinta, metoprolol and resume statin.  Long discussion today concerning importance of compliance with dual antiplatelet therapy.  I explained the risk of recurrent myocardial infarction and death if she misses these medications.  She would prefer  to stay on Brilinta and states that she will be able to obtain following discharge.  I explained that Plavix would also be an option.  She will discuss this further with her family and we will make a decision at time of discharge likely tomorrow.  2 hypertension-continue present medications and follow.  3 hyperlipidemia-resume Crestor 40 mg daily.  Check lipids and liver in 12 weeks.  4 noncompliance-we discussed the importance of compliant with all medications and follow-up.  For questions or updates, please contact [facility] Please consult [email or web address] for contact info under        Signed, Olga Millers, MD  07/07/2019, 8:42 AM

## 2019-07-07 NOTE — Progress Notes (Signed)
Patient's vital signs monitored s/p cath ,post sheath remova..Rt groin site level 0,c/o feeling cold , temp 102.9 -101.4 deg F (rechecked both  orally and axillary)  Dr Mackie Pai notified , with orders, tylenol 650 mg given, BC requested , urine for UA and C/S to be collected.Continue to monitor patient

## 2019-07-07 NOTE — Progress Notes (Signed)
Removal of R fem Arterial Sheath at bedside with pt nurse at 2335. .ACT=147.Pt instructed and verbalized understanding of removal procedure. Pt VS remained stable throughout procedure. Distal pulses strong and palpable throughout. Pressure held for 25 min at site. Pt had to be reminded continuously to not move the restricted RLE. No hematoma noted at site post removal, pressure dressing placed. See flowsheet for VS.

## 2019-07-08 LAB — URINE CULTURE: Culture: >=100000 — AB

## 2019-07-08 MED ORDER — TICAGRELOR 90 MG PO TABS: 90.0000 mg | ORAL_TABLET | Freq: Two times a day (BID) | ORAL | 11 refills | Status: DC

## 2019-07-08 MED ORDER — ASPIRIN EC 81 MG PO TBEC: 81.0000 mg | DELAYED_RELEASE_TABLET | Freq: Every day | ORAL | 2 refills | Status: AC

## 2019-07-08 MED ORDER — ISOSORBIDE MONONITRATE ER 60 MG PO TB24: 60.0000 mg | ORAL_TABLET | Freq: Every day | ORAL | 3 refills | Status: DC

## 2019-07-08 NOTE — Discharge Summary (Signed)
Discharge Summary    Patient ID: [name] MRN: [id number]; DOB: Aug 21, 1965  Admit date: 07/06/2019 Discharge date: 07/08/2019  Primary Care Provider: Lavinia Sharps, NP  Primary Cardiologist: Lesleigh Noe, MD  Primary Electrophysiologist:  None   Discharge Diagnoses    Principal Problem:   NSTEMI (non-ST elevated myocardial infarction) [facility]) Active Problems:   CAD (coronary artery disease)   Hypertension   Tobacco abuse   Benzodiazepine abuse (HCC)   Opiate abuse, episodic (HCC)   Mixed hyperlipidemia   Noncompliance with medication regimen   Chest pain   OSA (obstructive sleep apnea)   Metabolic syndrome   Bipolar disorder (HCC)   Chronic diastolic CHF (congestive heart failure) (HCC)   Alcohol abuse   HLD (hyperlipidemia)   Obesity, Class II, BMI 35-39.9   S/P CABG x 4   Diagnostic Studies/Procedures    Left heart cath 07/06/19:  CULPRIT LESION LPAV-1 lesion is 100% stenosed -at the ostium involving the stent in distal LCx-OM2.  A drug-eluting stent was successfully placed (from the LCx stent through stent struts into the AV groove circumflex, using a STENT RESOLUTE ONYX 2.5X18 -postdilated 2.6 mm  Post intervention, there is a 0% residual stenosis.  -------  LPAV-2ND lesion is 85% stenosed-noted after stent placement at the ostial lesion.  A drug-eluting stent was successfully placed overlapping the initial stent, using a STENT RESOLUTE ONYX 2.0X18 -postdilated in tapered fashion from 2.6 to 2.2 mm  Post intervention, there is a 0% residual stenosis  -----------  Mid Cx-OM2 lesion is 95% stenosed within the stent at the takeoff of AV groove circumflex.  Balloon angioplasty was performed using a BALLOON SAPPHIRE 2.0X15 -prior to stent placement through existing stent into AV groove circumflex.  Post intervention, there is a 100% residual stenosis -Unable to rewire. (Was relatively small distribution OM 2 previously  grafted)  ------------------------------------------------  Prox LAD lesion is 50% stenosed.  1st Diag lesion is 80% stenosed - & Mid LAD-2 lesion is 40% stenosed at this side branch..  SVG-Diag graft was visualized by angiography and is small. Origin lesion is 40% stenosed.  Mid LAD DES stent is widely patent.  LIMA graft was visualized by angiography and is moderate in size, and very tortuous. There is competitive flow. Dist Graft to Insertion lesion is 95% stenosed.  Dist LAD DES stent is widely patent -crosses LIMA insertion site.  Dist LAD-3 lesion is 65% stenosed -beyond stent  1st Mrg lesion is 50% stenosed. Large bifurcating vessel  Dist Cx/OM 2 lesion is 100% stenosed-at graft anastomosis site  Ost RCA to Prox RCA stent is 5% stenosed. Mid RCA to Dist RCA lesion is 40% stenosed.  RPDA lesion is 95% stenosed. Dist RCA lesion is 100% stenosed.  Previously placed Prox RCA drug eluting stent is widely patent.  Previously placed Mid RCA-1 drug eluting stent is widely patent.  Previously placed Mid RCA-2 drug eluting stent is widely patent.  KNOWN OCCLUDED SVG-rPDA & OM2  --------------------  The left ventricular systolic function is normal. The left ventricular ejection fraction is 50-55% by visual estimate.  LV end diastolic pressure is normal.  Prox Cx to Mid Cx lesion is 50% stenosed.   Diagnostic Dominance: Right  Intervention    _____________   History of Present Illness     [name] is a 54 y.o. female with a history of CAD s/p multiple PCI and CABG in 01/2019, hypertension, hyperlipidemia, GERD, prior alcohol/tobacco abuse, bipolar disorder with prior suicide ideation who is  being seen today for the evaluation of NSTEMI.  [name] is a 54 year old female with the above history. Patient has a long history of CAD. She had NSTEMI in 2012 which was treated with DES to LCX and RCA. She had another NSTEMI in 01/2019 and ultimately underwent CABG x4  with LIMA to LAD, SVG to PDA, SVG to OM3, and SVG to D1. She has had multiple ED visit for chest pain since her CABG.   She was admitted in 04/2019 with unstable angina. High-sensitivity troponin peaked at 4,127. She underwent cardiac catheterization on 04/25/2019 which showed occluded SVG to OM and SVG to RCA. She was treated with DES x2 to the proximal and mid/distal LAD. LIMA also had disease but intervention was unable to be performed due to compromised flow when straightened with a wire. She then underwent staged PCI to the RCA on 04/27/2019. Echo showed LVEF of 60-65% with basal inferior hypokinesis and mild LVH. She was started on Imdur 30mg  daily.   Patient was seen in the ED on 05/21/2019 with suicidal ideation and alcohol intoxication. She reported being very depressed and drinking approximately 1 bottle of Chardonnay daily. With her increased depression, she reported thought of suicide with plan to harm herself with a knife.She was initially IVC'd but then was not felt to required psychiatric hospitalization and was discharged with instructions to follow-up with primary provider.   She was most recently seen in the ED on 06/25/2019 for chest pain radiating up to her neck and left shoulder. High-sensitivity troponin borderline elevated and flat at 19 >> 20. Imdur was increased to 60mg  at the advise of Cardiology and patient was discharged with instructions to follow-up with Cardiology; however, this has not happened yet.  Patient called our office today with reports of intermittent chest pain x2 days. She also reported being out of Brilinta for the past 1.5 weeks. She was advised to go to the ED for further evaluation. Patient states she was doing well with no recurrent chest pain since increased dose Imdur until she stopped taking her Brilinta. Patient reports intermittent substernal/left sided chest pain that she ranks as as 7-8/10 on the pain scale. No radiation to neck or left arm but she does  reported some jaw pain the last 2 days and some abdominal pain. When pain first start 2 days ago, it was intermittent but has been almost constant since this morning. She also notes some atypical symptoms such as pleuritic pain and chest wall tenderness. She has had some shortness of breath. She denies any palpitations, lightheadedness, dizziness, or syncope. No orthopnea but she does describing waking up suddenly short of breath. She also reports snoring so I think this sounds more like sleep apnea than PND. She notes mild lower extremity edema since going back to work last week. 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 She was having nose bleeds on the Brilinta but no abnormal bleeding in urine or stools.   In the ED, vitals stable. EKG showed no acute ischemic changes compared to prior tracings. High-sensitivity troponin elevated at 1,155. Repeat pending. WBC 8.8, Hgb 12.2, Plts 276. Na 139, K 3.7, Glucose 111, BUN 9, Cr 0.79.   At the time of this evaluation, patient pain free after receiving Fentanyl. She quit smoking in 01/2019 at the time of her CABG. She reports drinking 2 glasses of Chardonnay on a daily basis. No recreational drug  use.   Hospital Course     Consultants:   NSTEMI CAD s/p CABG (01/2019) with subsequent intervention (04/2019) Noncompliance with brilinta Pt presented with chest pain following noncompliance with brilinta for 1.5 weeks. She states she couldn't afford the medication (medicaid pending). She has a history of noncompliance, including not reporting to cardiac rehab. Pt was admitted and underwent repeat angiography. LHC revealed a culprit lesion as 100% stenosis of the LPAV-1 at the ostium involving the stent in the distal LCx-OM2, treated with DES. A second lesion of 85% stenosis LPAV noted after stent placement at the ostial lesion, treated wit overlapping DES. Dr. Herbie Baltimore was able to balloon a MidCx-OM2 lesion  that was 95% stenosed, but was unable to re-wire to place a stent. She tolerated the procedure well. She was adamant that she would obtain brilinta. She did not want to transition to plavix at this time. Long discussion with Dr. Jens Som regarding the importance of compliance and possible transition to plavix if needed. Will continue brilinta at this time. Needs to start patient assistance.   Hypertension Continue home medications.    Hyperlipidemia 07/07/2019: Cholesterol 151; HDL 54; LDL Cholesterol 80; Triglycerides 83; VLDL 17 Continue 40 mg crestor and zetia. Recheck in 12 weeks. May require lipid clinic.    Former smoker Quit at the time of her CABG   Alcohol abuse Denies recent drinking. Seen with hand tremors, treated with ativan. CIWA protocol while hospitalized.   Pt seen and examined by Dr. Jens Som and felt stable for discharge. Cardiology follow up has been made.    Did the patient have an acute coronary syndrome (MI, NSTEMI, STEMI, etc) this admission?:  Yes                               AHA/ACC Clinical Performance & Quality Measures: 1. Aspirin prescribed? - Yes 2. ADP Receptor Inhibitor (Plavix/Clopidogrel, Brilinta/Ticagrelor or Effient/Prasugrel) prescribed (includes medically managed patients)? - Yes 3. Beta Blocker prescribed? - Yes 4. High Intensity Statin (Lipitor 40-80mg  or Crestor 20-40mg ) prescribed? - Yes 5. EF assessed during THIS hospitalization? - No - already checked 6. For EF <40%, was ACEI/ARB prescribed? - Yes 7. For EF <40%, Aldosterone Antagonist (Spironolactone or Eplerenone) prescribed? - Not Applicable (EF >/= 40%) 8. Cardiac Rehab Phase II ordered (including medically managed patients)? - Yes   _____________  Discharge Vitals Blood pressure (!) 144/86, pulse 82, temperature 98.7 F (37.1 C), temperature source Oral, resp. rate 20, height  (1.549 m), weight 81.2 kg, SpO2 96 %.  Filed Weights   07/06/19 1038 07/08/19 0342  Weight:  84.4 kg 81.2 kg    Labs & Radiologic Studies    CBC Recent Labs    07/06/19 1048 07/07/19 0214  WBC 8.8 10.2  HGB 12.2 12.4  HCT 39.7 40.6  MCV 85.6 86.0  PLT 276 264   Basic Metabolic Panel Recent Labs    16/10/96 1048 07/07/19 0214  NA 139 139  K 3.7 4.1  CL 106 106  CO2 23 22  GLUCOSE 111* 125*  BUN 9 8  CREATININE 0.79 0.86  CALCIUM 9.2 9.0   Liver Function Tests No results for input(s): AST, ALT, ALKPHOS, BILITOT, PROT, ALBUMIN in the last 72 hours. No results for input(s): LIPASE, AMYLASE in the last 72 hours. High Sensitivity Troponin:   Recent Labs  Lab 06/25/19 1436 06/25/19 1618 07/06/19 1048 07/06/19 1514  TROPONINIHS 19* 20* 1,155* 1,323*  BNP Invalid input(s): POCBNP D-Dimer No results for input(s): DDIMER in the last 72 hours. Hemoglobin A1C Recent Labs    07/06/19 2135  HGBA1C 6.3*   Fasting Lipid Panel Recent Labs    07/07/19 0214  CHOL 151  HDL 54  LDLCALC 80  TRIG 83  CHOLHDL 2.8   Thyroid Function Tests No results for input(s): TSH, T4TOTAL, T3FREE, THYROIDAB in the last 72 hours.  Invalid input(s): FREET3 _____________  DG Chest 2 View  Result Date: 07/06/2019 CLINICAL DATA:  Chest pain, central chest pain and shortness of breath EXAM: CHEST - 2 VIEW COMPARISON:  06/25/2019 FINDINGS: Cardiomediastinal contours and hilar structures are stable. Heart size remains enlarged. Lung volumes slightly diminished compared to previous exam. Findings of median sternotomy and CABG. Obscured partially LEFT hemidiaphragmatic contour of the medial aspect. No sign of pleural effusion. Visualized skeletal structures are unremarkable the extent evaluated. IMPRESSION: Low lung volumes with streaky opacities in the medial LEFT lung base, atelectasis versus infiltrate. Electronically Signed   By: Donzetta Kohut M.D.   On: 07/06/2019 11:03   DG Chest 2 View  Result Date: 06/25/2019 CLINICAL DATA:  Chest pain. Additional provided: Shortness of  breath. EXAM: CHEST - 2 VIEW COMPARISON:  Prior chest radiographs 04/24/2019 and earlier FINDINGS: Prior median sternotomy. Heart size at the upper limits of normal, unchanged. No appreciable airspace consolidation within the lungs. No evidence of pleural effusion or pneumothorax. No acute bony abnormality identified. IMPRESSION: No evidence of acute cardiopulmonary abnormality. Electronically Signed   By: Jackey Loge DO   On: 06/25/2019 15:01   CARDIAC CATHETERIZATION  Result Date: 07/06/2019  CULPRIT LESION LPAV-1 lesion is 100% stenosed -at the ostium involving the stent in distal LCx-OM2.  A drug-eluting stent was successfully placed (from the LCx stent through stent struts into the AV groove circumflex, using a STENT RESOLUTE ONYX 2.5X18 -postdilated 2.6 mm  Post intervention, there is a 0% residual stenosis.  -------  LPAV-2ND lesion is 85% stenosed-noted after stent placement at the ostial lesion.  A drug-eluting stent was successfully placed overlapping the initial stent, using a STENT RESOLUTE ONYX 2.0X18 -postdilated in tapered fashion from 2.6 to 2.2 mm  Post intervention, there is a 0% residual stenosis  -----------  Mid Cx-OM2 lesion is 95% stenosed within the stent at the takeoff of AV groove circumflex.  Balloon angioplasty was performed using a BALLOON SAPPHIRE 2.0X15 -prior to stent placement through existing stent into AV groove circumflex.  Post intervention, there is a 100% residual stenosis -Unable to rewire. (Was relatively small distribution OM 2 previously grafted)  ------------------------------------------------  Prox LAD lesion is 50% stenosed.  1st Diag lesion is 80% stenosed - & Mid LAD-2 lesion is 40% stenosed at this side branch..  SVG-Diag graft was visualized by angiography and is small. Origin lesion is 40% stenosed.  Mid LAD DES stent is widely patent.  LIMA graft was visualized by angiography and is moderate in size, and very tortuous. There is competitive  flow. Dist Graft to Insertion lesion is 95% stenosed.  Dist LAD DES stent is widely patent -crosses LIMA insertion site.  Dist LAD-3 lesion is 65% stenosed -beyond stent  1st Mrg lesion is 50% stenosed. Large bifurcating vessel  Dist Cx/OM 2 lesion is 100% stenosed-at graft anastomosis site  Ost RCA to Prox RCA stent is 5% stenosed. Mid RCA to Dist RCA lesion is 40% stenosed.  RPDA lesion is 95% stenosed. Dist RCA lesion is 100% stenosed.  Previously placed Prox RCA  drug eluting stent is widely patent.  Previously placed Mid RCA-1 drug eluting stent is widely patent.  Previously placed Mid RCA-2 drug eluting stent is widely patent.  KNOWN OCCLUDED SVG-rPDA & OM2  --------------------  The left ventricular systolic function is normal. The left ventricular ejection fraction is 50-55% by visual estimate.  LV end diastolic pressure is normal.  Prox Cx to Mid Cx lesion is 50% stenosed.    Disposition   Pt is being discharged home today in good condition.  Follow-up Plans & Appointments    Follow-up Information    Filbert Schilder, NP Follow up on 07/16/2019.   Specialty: Cardiology Why: 2:45 pm Contact information: [address] [place] [address] [phone]          Discharge Instructions    Amb Referral to Cardiac Rehabilitation   Complete by: As directed    Diagnosis:  Coronary Stents NSTEMI     After initial evaluation and assessments completed: Virtual Based Care may be provided alone or in conjunction with Phase 2 Cardiac Rehab based on patient barriers.: Yes   Diet - low sodium heart healthy   Complete by: As directed    Discharge instructions   Complete by: As directed    No driving for 2 days. No lifting over 5 lbs for 1 week. No sexual activity for 1 week. Keep procedure site clean & dry. If you notice increased pain, swelling, bleeding or pus, call/return!  You may shower, but no soaking baths/hot tubs/pools for 1 week.   Increase activity slowly    Complete by: As directed       Discharge Medications   Allergies as of 07/08/2019      Reactions   Lidocaine Anaphylaxis, Itching   Codeine Nausea And Vomiting   Percocet [oxycodone-acetaminophen] Nausea And Vomiting   Vicodin [hydrocodone-acetaminophen] Nausea And Vomiting   5-alpha Reductase Inhibitors    Atorvastatin Other (See Comments)   "This made me jittery and I didn't like the way it made me feel"   Latex Itching   Reaction to powder in latex gloves      Medication List    STOP taking these medications   aspirin 81 MG chewable tablet Replaced by: aspirin EC 81 MG tablet     TAKE these medications   albuterol 108 (90 Base) MCG/ACT inhaler Commonly known as: VENTOLIN HFA Inhale 2 puffs into the lungs every 6 (six) hours as needed for wheezing or shortness of breath.   amLODipine 5 MG tablet Commonly known as: NORVASC Take 1 tablet (5 mg total) by mouth daily.   aspirin EC 81 MG tablet Take 1 tablet (81 mg total) by mouth daily. Replaces: aspirin 81 MG chewable tablet   busPIRone 10 MG tablet Commonly known as: BUSPAR Take 1 tablet (10 mg total) by mouth 3 (three) times daily.   Depakote ER 500 MG 24 hr tablet Generic drug: divalproex Take 1 tablet by mouth See admin instructions. Take 500 mg daily before bedtime for 3 days, then 500 mg twice daily for 3 days, then 500 mg three times daily for the rest of the month   escitalopram 20 MG tablet Commonly known as: Lexapro Take 1 tablet (20 mg total) by mouth daily for 15 days.   ezetimibe 10 MG tablet Commonly known as: ZETIA Take 1 tablet (10 mg total) by mouth daily.   furosemide 20 MG tablet Commonly known as: LASIX Take 1 tablet (20 mg total) by mouth as needed  for fluid or edema.   gabapentin 300 MG capsule Commonly known as: NEURONTIN Take 1 capsule (300 mg total) by mouth 3 (three) times daily.   isosorbide mononitrate 60 MG 24 hr tablet Commonly known as: IMDUR Take 1 tablet (60 mg total) by  mouth daily. What changed: Another medication with the same name was removed. Continue taking this medication, and follow the directions you see here.   LORazepam 0.5 MG tablet Commonly known as: ATIVAN Take 0.5 mg by mouth daily as needed for anxiety.   losartan 25 MG tablet Commonly known as: COZAAR Take 0.5 tablets (12.5 mg total) by mouth daily.   metoprolol tartrate 25 MG tablet Commonly known as: LOPRESSOR Take 1 tablet (25 mg total) by mouth 2 (two) times daily.   nitroGLYCERIN 0.4 MG SL tablet Commonly known as: NITROSTAT Place 1 tablet (0.4 mg total) under the tongue every 5 (five) minutes x 3 doses as needed for chest pain.   pantoprazole 40 MG tablet Commonly known as: PROTONIX Take 40 mg by mouth daily.   QUEtiapine 25 MG tablet Commonly known as: SEROQUEL Take 1 tablet (25 mg total) by mouth 2 (two) times daily.   rosuvastatin 40 MG tablet Commonly known as: CRESTOR Take 1 tablet (40 mg total) by mouth daily.   ticagrelor 90 MG Tabs tablet Commonly known as: BRILINTA Take 1 tablet (90 mg total) by mouth 2 (two) times daily.          Outstanding Labs/Studies   none  Duration of Discharge Encounter   Greater than 30 minutes including physician time.  Signed, [place], [place] 07/08/2019, 9:52 AM

## 2019-07-08 NOTE — Discharge Instructions (Signed)
  _________________________________________________________________________________________________________________  If you are unable to afford or obtain brilinta, please call our office as soon as possible to obtain a prescription for plavix. It is very important that you continue either brilinta or plavix uninterrupted. Please call [facility] as soon as they open on Tues to establish care and help with brilinta.  _________________________________________________________________________________________________________________

## 2019-07-08 NOTE — Discharge Summary (Signed)
Discharge instructions given to patient. Patient verbalizes understanding. No change since am assessment. Patient stable at time of discharge. Verified prescription pickup of Brilinta at discharge.

## 2019-07-08 NOTE — Plan of Care (Signed)
  Problem: Clinical Measurements: Goal: Respiratory complications will improve Outcome: Progressing Goal: Cardiovascular complication will be avoided Outcome: Progressing   Problem: Pain Managment: Goal: General experience of comfort will improve Outcome: Progressing   Problem: Cardiovascular: Goal: Vascular access site(s) Level 0-1 will be maintained Outcome: Progressing

## 2019-07-08 NOTE — Progress Notes (Signed)
Somewhat anxious, sitting in bed, pulled off heart monitor, and spilled coffee on tray. Mild tremors in finger tips of both hands noted. Pt asking for ativan which she says she takes at home. When asked if she drinks alcohol, states "No", and states "Well, I did three days ago". Reorientation offered. Call bell in reach. Bed Alarm set. Ativan 2 mg po given at this time.

## 2019-07-08 NOTE — Care Management (Addendum)
Consult received to assist with Brilinta refill.  Patient has been non-compliant with Brilinta.  Patient has been given less expensive options that she has declined, stating that her family can pay for her to fill Brilinta today.  Patient has used free 30 day card in the past and has received MATCH assistance in the last year.   Addendum 1300: Spoke with patient regarding Brilinta.  She states her daughter has already been to the pharmacy and filled the Brilinta.  She states she actually has 2 months worth of medicine at her home.  Patient states she has plenty of money to fill script normally and had hit a rough spot just PTA (when medicines were stopped). Patient advised to f/u with PCP ASAP to access/resume the patient assistance program through the [employer].

## 2019-07-08 NOTE — Progress Notes (Signed)
Progress Note  Patient Name: [name] Date of Encounter: 07/08/2019  Primary Cardiologist: Lesleigh Noe, MD   Subjective   Pt denies CP or dyspnea  Inpatient Medications    Scheduled Meds: . amLODipine  5 mg Oral Daily  . aspirin  81 mg Oral Daily  . busPIRone  10 mg Oral TID  . divalproex  500 mg Oral TID  . enoxaparin (LOVENOX) injection  40 mg Subcutaneous Q24H  . escitalopram  20 mg Oral Daily  . ezetimibe  10 mg Oral Daily  . isosorbide mononitrate  60 mg Oral Daily  . losartan  12.5 mg Oral Daily  . metoprolol tartrate  25 mg Oral BID  . pantoprazole  40 mg Oral Daily  . rosuvastatin  40 mg Oral Daily  . sodium chloride flush  3 mL Intravenous Once  . sodium chloride flush  3 mL Intravenous Q12H  . ticagrelor  90 mg Oral BID   Continuous Infusions: . sodium chloride     PRN Meds: sodium chloride, acetaminophen, albuterol, LORazepam **OR** LORazepam, morphine injection, nitroGLYCERIN, ondansetron (ZOFRAN) IV, sodium chloride flush   Vital Signs    Vitals:   07/07/19 2051 07/07/19 2140 07/08/19 0104 07/08/19 0342  BP: 111/88  132/73 133/85  Pulse:  80 89 84  Resp: 17  16 18   Temp:   98.4 F (36.9 C) 98.2 F (36.8 C)  TempSrc:   Oral Oral  SpO2:   96% 96%  Weight:    81.2 kg  Height:        Intake/Output Summary (Last 24 hours) at 07/08/2019 0722 Last data filed at 07/07/2019 2120 Gross per 24 hour  Intake 960 ml  Output -  Net 960 ml   Last 3 Weights 07/08/2019 07/06/2019 06/25/2019  Weight (lbs) 179 lb 1.6 oz 186 lb 180 lb  Weight (kg) 81.239 kg 84.369 kg 81.647 kg  Some encounter information is confidential and restricted. Go to Review Flowsheets activity to see all data.      Telemetry    Sinus with pacs and pvcs; couplet- Personally Reviewed  Physical Exam   GEN: No acute distress. Obese  Neck: No JVD, supple Cardiac: RRR Respiratory: CTA; no wheeze GI: Soft, NT/ND MS: No edema Neuro:  Grossly intact Psych: Normal affect    Labs    High Sensitivity Troponin:   Recent Labs  Lab 06/25/19 1436 06/25/19 1618 07/06/19 1048 07/06/19 1514  TROPONINIHS 19* 20* 1,155* 1,323*      Chemistry Recent Labs  Lab 07/06/19 1048 07/07/19 0214  NA 139 139  K 3.7 4.1  CL 106 106  CO2 23 22  GLUCOSE 111* 125*  BUN 9 8  CREATININE 0.79 0.86  CALCIUM 9.2 9.0  GFRNONAA >60 >60  GFRAA >60 >60  ANIONGAP 10 11     Hematology Recent Labs  Lab 07/06/19 1048 07/07/19 0214  WBC 8.8 10.2  RBC 4.64 4.72  HGB 12.2 12.4  HCT 39.7 40.6  MCV 85.6 86.0  MCH 26.3 26.3  MCHC 30.7 30.5  RDW 16.6* 16.9*  PLT 276 264     Radiology    DG Chest 2 View  Result Date: 07/06/2019 CLINICAL DATA:  Chest pain, central chest pain and shortness of breath EXAM: CHEST - 2 VIEW COMPARISON:  06/25/2019 FINDINGS: Cardiomediastinal contours and hilar structures are stable. Heart size remains enlarged. Lung volumes slightly diminished compared to previous exam. Findings of median sternotomy and CABG. Obscured partially LEFT hemidiaphragmatic contour of  the medial aspect. No sign of pleural effusion. Visualized skeletal structures are unremarkable the extent evaluated. IMPRESSION: Low lung volumes with streaky opacities in the medial LEFT lung base, atelectasis versus infiltrate. Electronically Signed   By: Donzetta Kohut M.D.   On: 07/06/2019 11:03   CARDIAC CATHETERIZATION  Result Date: 07/06/2019  CULPRIT LESION LPAV-1 lesion is 100% stenosed -at the ostium involving the stent in distal LCx-OM2.  A drug-eluting stent was successfully placed (from the LCx stent through stent struts into the AV groove circumflex, using a STENT RESOLUTE ONYX 2.5X18 -postdilated 2.6 mm  Post intervention, there is a 0% residual stenosis.  -------  LPAV-2ND lesion is 85% stenosed-noted after stent placement at the ostial lesion.  A drug-eluting stent was successfully placed overlapping the initial stent, using a STENT RESOLUTE ONYX 2.0X18 -postdilated in  tapered fashion from 2.6 to 2.2 mm  Post intervention, there is a 0% residual stenosis  -----------  Mid Cx-OM2 lesion is 95% stenosed within the stent at the takeoff of AV groove circumflex.  Balloon angioplasty was performed using a BALLOON SAPPHIRE 2.0X15 -prior to stent placement through existing stent into AV groove circumflex.  Post intervention, there is a 100% residual stenosis -Unable to rewire. (Was relatively small distribution OM 2 previously grafted)  ------------------------------------------------  Prox LAD lesion is 50% stenosed.  1st Diag lesion is 80% stenosed - & Mid LAD-2 lesion is 40% stenosed at this side branch..  SVG-Diag graft was visualized by angiography and is small. Origin lesion is 40% stenosed.  Mid LAD DES stent is widely patent.  LIMA graft was visualized by angiography and is moderate in size, and very tortuous. There is competitive flow. Dist Graft to Insertion lesion is 95% stenosed.  Dist LAD DES stent is widely patent -crosses LIMA insertion site.  Dist LAD-3 lesion is 65% stenosed -beyond stent  1st Mrg lesion is 50% stenosed. Large bifurcating vessel  Dist Cx/OM 2 lesion is 100% stenosed-at graft anastomosis site  Ost RCA to Prox RCA stent is 5% stenosed. Mid RCA to Dist RCA lesion is 40% stenosed.  RPDA lesion is 95% stenosed. Dist RCA lesion is 100% stenosed.  Previously placed Prox RCA drug eluting stent is widely patent.  Previously placed Mid RCA-1 drug eluting stent is widely patent.  Previously placed Mid RCA-2 drug eluting stent is widely patent.  KNOWN OCCLUDED SVG-rPDA & OM2  --------------------  The left ventricular systolic function is normal. The left ventricular ejection fraction is 50-55% by visual estimate.  LV end diastolic pressure is normal.  Prox Cx to Mid Cx lesion is 50% stenosed.     Patient Profile     54 y.o. female with past medical history of coronary artery disease status post coronary artery bypass and graft December  2020, follow-up catheterization due to non-ST elevation myocardial infarction in March with drug-eluting stents to the proximal and mid/distal LAD as well as PCI of RCA, hypertension, hyperlipidemia, bipolar disorder admitted with non-ST elevation myocardial infarction.  Patient has been noncompliant with Brilinta.  Assessment & Plan    1 non-ST elevation myocardial infarction-patient is now status post PCI of LPAV1, LPAV2 with DESs and PTCA of Lcx (unsuccessful).  Continue aspirin, Brilinta, metoprolol and resume statin.  I again discussed dual antiplatelet therapy with patient.  I explained the risk of stent thrombosis, myocardial infarction and death if she does not take her medications.  She states she can now obtain Brilinta.  If this becomes an issue as an outpatient could  change to Plavix.   2 hypertension-continue present medications and follow.  3 hyperlipidemia-continue Crestor 40 mg daily.  Check lipids and liver in 12 weeks.  4 noncompliance-we discussed the importance of compliant with all medications and follow-up.  Plan discharge today.  She has an appointment with Dr. Tamala Julian on June 7 which she will keep.  Greater than 30 minutes p.m. physician time. D2  For questions or updates, please contact [facility] Please consult [email or web address] for contact info under        Signed, Kirk Ruths, MD  07/08/2019, 7:22 AM

## 2019-07-10 ENCOUNTER — Telehealth: Payer: Self-pay | Admitting: Interventional Cardiology

## 2019-07-10 NOTE — Telephone Encounter (Signed)
Attempted to contact pt but no answer and VM was full.  Spoke with Dr. Katrinka Blazing and he said pt cannot return to work at this time.  She will need to keep appt scheduled 6/7 with Georgie Chard, NP and then we can decide when she can return to work.

## 2019-07-10 NOTE — Telephone Encounter (Signed)
Patient is requesting a note, to be signed by Dr. Katrinka Blazing, stating that she is eligible to return to work. She states she is requesting to speka with Dr. Michaelle Copas nurse, Victorino Dike. Please call.

## 2019-07-11 NOTE — Telephone Encounter (Signed)
Attempted to contact pt again.  Still no answer and VM is full.

## 2019-07-12 ENCOUNTER — Telehealth (HOSPITAL_COMMUNITY): Payer: Self-pay

## 2019-07-12 LAB — CULTURE, BLOOD (ROUTINE X 2)
Culture: NO GROWTH
Culture: NO GROWTH
Special Requests: ADEQUATE
Special Requests: ADEQUATE

## 2019-07-12 NOTE — Telephone Encounter (Signed)
Attempted to call patient in regards to Cardiac Rehab - VM is full unable to leave VM.

## 2019-07-12 NOTE — Telephone Encounter (Signed)
Pt Medicaid is pending. 
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
## 2019-07-16 ENCOUNTER — Ambulatory Visit: Payer: Self-pay | Admitting: Cardiology

## 2019-07-22 ENCOUNTER — Emergency Department (HOSPITAL_COMMUNITY)
Admission: EM | Admit: 2019-07-22 | Discharge: 2019-07-22 | Disposition: A | Discharge status: Home / Self Care | Payer: Self-pay | Attending: Emergency Medicine | Admitting: Emergency Medicine

## 2019-07-22 ENCOUNTER — Emergency Department (HOSPITAL_BASED_OUTPATIENT_CLINIC_OR_DEPARTMENT_OTHER): Payer: Self-pay

## 2019-07-22 ENCOUNTER — Emergency Department (HOSPITAL_COMMUNITY): Payer: Self-pay

## 2019-07-22 ENCOUNTER — Other Ambulatory Visit: Payer: Self-pay

## 2019-07-22 DIAGNOSIS — Z87891 Personal history of nicotine dependence: Secondary | ICD-10-CM | POA: Insufficient documentation

## 2019-07-22 DIAGNOSIS — I110 Hypertensive heart disease with heart failure: Secondary | ICD-10-CM | POA: Insufficient documentation

## 2019-07-22 DIAGNOSIS — I2510 Atherosclerotic heart disease of native coronary artery without angina pectoris: Secondary | ICD-10-CM | POA: Insufficient documentation

## 2019-07-22 DIAGNOSIS — Z951 Presence of aortocoronary bypass graft: Secondary | ICD-10-CM | POA: Insufficient documentation

## 2019-07-22 DIAGNOSIS — Z79899 Other long term (current) drug therapy: Secondary | ICD-10-CM | POA: Insufficient documentation

## 2019-07-22 DIAGNOSIS — R2242 Localized swelling, mass and lump, left lower limb: Secondary | ICD-10-CM | POA: Insufficient documentation

## 2019-07-22 DIAGNOSIS — M7989 Other specified soft tissue disorders: Secondary | ICD-10-CM

## 2019-07-22 DIAGNOSIS — M25472 Effusion, left ankle: Secondary | ICD-10-CM

## 2019-07-22 DIAGNOSIS — I5032 Chronic diastolic (congestive) heart failure: Secondary | ICD-10-CM | POA: Insufficient documentation

## 2019-07-22 DIAGNOSIS — Z91040 Latex allergy status: Secondary | ICD-10-CM | POA: Insufficient documentation

## 2019-07-22 DIAGNOSIS — M79609 Pain in unspecified limb: Secondary | ICD-10-CM

## 2019-07-22 NOTE — ED Triage Notes (Signed)
Patient reports left ankle pain with mild swelling onset 3 days ago , denies injury/ambulatory , respirations unlabored.

## 2019-07-22 NOTE — ED Notes (Addendum)
Pt transported to US
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
## 2019-07-22 NOTE — ED Provider Notes (Signed)
54yo female with left ankle pain/swelling, recent bipass surgery with stenting of graft, swelling to left medial ankle (same leg as graft site). XR negative, awaiting DVT study, if negative, can dc home. Physical Exam  BP (!) 149/75   Pulse 77   Temp 97.7 F (36.5 C) (Oral)   Resp 18   Ht 5\' 1"  (1.549 m)   Wt 90 kg   SpO2 97%   BMI 37.49 kg/m   Physical Exam  ED Course/Procedures     Procedures  MDM  Doppler study is negative for DVT.  Patient may follow-up with PCP.       , PA-C 07/22/19 07/24/19    [id number], MD 07/24/19 [phone]

## 2019-07-22 NOTE — Progress Notes (Signed)
VASCULAR LAB PRELIMINARY  PRELIMINARY  PRELIMINARY  PRELIMINARY  Left lower extremity venous duplex completed.    Preliminary report:  See CV proc for preliminary results.  Called report to Dr. Trude Mcburney, [facility], RVT 07/22/2019, 7:49 AM

## 2019-08-17 ENCOUNTER — Telehealth: Payer: Self-pay | Admitting: Interventional Cardiology

## 2019-08-17 NOTE — Telephone Encounter (Signed)
Hey Lynn, LPN, can you please advise on this matter? Thanks  ?

## 2019-08-17 NOTE — Telephone Encounter (Signed)
Patient calling the office for samples of medication:   1.  What medication and dosage are you requesting samples for? ticagrelor (BRILINTA) 90 MG TABS tablet   2.  Are you currently out of this medication? Yes    

## 2019-08-17 NOTE — Telephone Encounter (Signed)
**Note De-Identified  Obfuscation** Per [facility] the cost for Brilinta to the pt is $381.42 for a 30 day supply and that the pt does not have ins coverage for her medications.  I called the pt and she states that she has applied for help with her medication costs through the [facility] and they told her it will be 2 to 3 weeks until she is approved/denied for the program. She is out of Brilinta and is requesting samples.  I advised her that we are leaving her 4 bottles with 8 tablets each of Brilinta 90 mg samples for her to pick up today in the front office at Dr Lonn [place] office.  She is aware to call us in 2 weeks if she needs more samples as we do not want her to run out of Brilinta.  She verbalized understanding and thanked me for our help.

## 2019-10-18 ENCOUNTER — Emergency Department (HOSPITAL_COMMUNITY): Payer: Self-pay

## 2019-10-18 ENCOUNTER — Encounter (HOSPITAL_COMMUNITY): Payer: Self-pay

## 2019-10-18 ENCOUNTER — Other Ambulatory Visit: Payer: Self-pay

## 2019-10-18 ENCOUNTER — Emergency Department (HOSPITAL_COMMUNITY)
Admission: EM | Admit: 2019-10-18 | Discharge: 2019-10-18 | Disposition: A | Discharge status: Home / Self Care | Payer: Self-pay | Attending: Emergency Medicine | Admitting: Emergency Medicine

## 2019-10-18 DIAGNOSIS — I25721 Atherosclerosis of autologous artery coronary artery bypass graft(s) with angina pectoris with documented spasm: Secondary | ICD-10-CM | POA: Insufficient documentation

## 2019-10-18 DIAGNOSIS — Z7982 Long term (current) use of aspirin: Secondary | ICD-10-CM | POA: Insufficient documentation

## 2019-10-18 DIAGNOSIS — M79651 Pain in right thigh: Secondary | ICD-10-CM | POA: Insufficient documentation

## 2019-10-18 DIAGNOSIS — Z79899 Other long term (current) drug therapy: Secondary | ICD-10-CM | POA: Insufficient documentation

## 2019-10-18 DIAGNOSIS — R350 Frequency of micturition: Secondary | ICD-10-CM

## 2019-10-18 DIAGNOSIS — I5032 Chronic diastolic (congestive) heart failure: Secondary | ICD-10-CM | POA: Insufficient documentation

## 2019-10-18 DIAGNOSIS — Z87891 Personal history of nicotine dependence: Secondary | ICD-10-CM | POA: Insufficient documentation

## 2019-10-18 DIAGNOSIS — M25551 Pain in right hip: Secondary | ICD-10-CM

## 2019-10-18 DIAGNOSIS — I110 Hypertensive heart disease with heart failure: Secondary | ICD-10-CM | POA: Insufficient documentation

## 2019-10-18 DIAGNOSIS — Z91040 Latex allergy status: Secondary | ICD-10-CM | POA: Insufficient documentation

## 2019-10-18 LAB — URINALYSIS, ROUTINE W REFLEX MICROSCOPIC
Bilirubin Urine: NEGATIVE
Glucose, UA: NEGATIVE mg/dL
Hgb urine dipstick: NEGATIVE
Ketones, ur: 5 mg/dL — AB
Nitrite: NEGATIVE
Protein, ur: NEGATIVE mg/dL
Specific Gravity, Urine: 1.016 (ref 1.005–1.030)
pH: 7 (ref 5.0–8.0)

## 2019-10-18 MED ORDER — CEPHALEXIN 250 MG PO CAPS
250.0000 mg | ORAL_CAPSULE | Freq: Four times a day (QID) | ORAL | 0 refills | Status: AC
Start: 2019-10-18 — End: 2019-10-23
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
## 2019-10-18 NOTE — Discharge Instructions (Addendum)
Please return to the ER with new or worsening symptoms.  It was a pleasure to meet you.  I would recommend taking Tylenol as needed for management of your pain.  Please follow the instructions on the bottle.  Also, please consider topical pain relieving creams such as Voltaran Gel, BioFreeze, or Icy Hot. There is also a pain relieving cream made by [employer]. You should be able to find all of these at your local pharmacy.   I am prescribing you Keflex which is an antibiotic for your urinary symptoms.  Please take this 4 times a day for the next 5 days.

## 2019-10-18 NOTE — ED Notes (Signed)
Patient has a urine culture in the main lab 
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
## 2019-10-21 LAB — URINE CULTURE: Culture: 70000 — AB

## 2019-10-22 ENCOUNTER — Telehealth: Payer: Self-pay | Admitting: Emergency Medicine
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
## 2019-10-22 NOTE — Telephone Encounter (Signed)
Post ED Visit - Positive Culture Follow-up  Culture report reviewed by antimicrobial stewardship pharmacist: [facility] []  , Pharm.D. []  Enzo Bi, Pharm.D., BCPS AQ-ID []  , Pharm.D., BCPS []  Celedonio Miyamoto, Pharm.D., BCPS []  [place], Garvin Fila.D., BCPS, AAHIVP []  , Pharm.D., BCPS, AAHIVP []  Georgina Pillion, PharmD, BCPS []  , PharmD, BCPS []  [place], PharmD, BCPS []  [address], PharmD []  , PharmD, BCPS []  Estella Husk, PharmD  Pharmacy Team []  Lysle Pearl, PharmD []  , PharmD []  Phillips Climes, PharmD []  , Rph []  Agapito Games) , PharmD []  Verlan Friends, PharmD []  , PharmD [x]  Mervyn Gay, PharmD []  , PharmD []  Vinnie Level, PharmD []  [facility], PharmD []  , PharmD []  Len Childs, PharmD   Positive urine culture Treated with cephalexin, organism sensitive to the same and no further patient follow-up is required at this time.  10/22/2019, 11:09 AM

## 2019-10-26 ENCOUNTER — Encounter (HOSPITAL_COMMUNITY): Payer: Self-pay

## 2019-10-26 ENCOUNTER — Other Ambulatory Visit: Payer: Self-pay

## 2019-10-26 ENCOUNTER — Emergency Department (HOSPITAL_COMMUNITY): Payer: Self-pay

## 2019-10-26 ENCOUNTER — Emergency Department (HOSPITAL_COMMUNITY)
Admission: EM | Admit: 2019-10-26 | Discharge: 2019-10-27 | Disposition: A | Discharge status: Home / Self Care | Payer: Self-pay | Attending: Emergency Medicine | Admitting: Emergency Medicine

## 2019-10-26 DIAGNOSIS — I5032 Chronic diastolic (congestive) heart failure: Secondary | ICD-10-CM | POA: Insufficient documentation

## 2019-10-26 DIAGNOSIS — I214 Non-ST elevation (NSTEMI) myocardial infarction: Secondary | ICD-10-CM | POA: Insufficient documentation

## 2019-10-26 DIAGNOSIS — I25110 Atherosclerotic heart disease of native coronary artery with unstable angina pectoris: Secondary | ICD-10-CM | POA: Insufficient documentation

## 2019-10-26 DIAGNOSIS — Z7982 Long term (current) use of aspirin: Secondary | ICD-10-CM | POA: Insufficient documentation

## 2019-10-26 DIAGNOSIS — Z91040 Latex allergy status: Secondary | ICD-10-CM | POA: Insufficient documentation

## 2019-10-26 DIAGNOSIS — I130 Hypertensive heart and chronic kidney disease with heart failure and stage 1 through stage 4 chronic kidney disease, or unspecified chronic kidney disease: Secondary | ICD-10-CM | POA: Insufficient documentation

## 2019-10-26 DIAGNOSIS — R079 Chest pain, unspecified: Secondary | ICD-10-CM | POA: Insufficient documentation

## 2019-10-26 DIAGNOSIS — Z87891 Personal history of nicotine dependence: Secondary | ICD-10-CM | POA: Insufficient documentation

## 2019-10-26 DIAGNOSIS — Z951 Presence of aortocoronary bypass graft: Secondary | ICD-10-CM | POA: Insufficient documentation

## 2019-10-26 DIAGNOSIS — N189 Chronic kidney disease, unspecified: Secondary | ICD-10-CM | POA: Insufficient documentation

## 2019-10-26 DIAGNOSIS — Z79899 Other long term (current) drug therapy: Secondary | ICD-10-CM | POA: Insufficient documentation

## 2019-10-26 LAB — BASIC METABOLIC PANEL
Anion gap: 10 (ref 5–15)
BUN: 9 mg/dL (ref 6–20)
CO2: 23 mmol/L (ref 22–32)
Calcium: 9 mg/dL (ref 8.9–10.3)
Chloride: 106 mmol/L (ref 98–111)
Creatinine, Ser: 0.77 mg/dL (ref 0.44–1.00)
GFR calc Af Amer: >60 mL/min (ref >60)
GFR calc non Af Amer: >60 mL/min (ref >60)
Glucose, Bld: 103 mg/dL — ABNORMAL HIGH (ref 70–99)
Potassium: 3.7 mmol/L (ref 3.5–5.1)
Sodium: 139 mmol/L (ref 135–145)

## 2019-10-26 LAB — CBC
HCT: 41.2 % (ref 36.0–46.0)
Hemoglobin: 13.2 g/dL (ref 12.0–15.0)
MCH: 28.4 pg (ref 26.0–34.0)
MCHC: 32 g/dL (ref 30.0–36.0)
MCV: 88.6 fL (ref 80.0–100.0)
Platelets: 223 10*3/uL (ref 150–400)
RBC: 4.65 MIL/uL (ref 3.87–5.11)
RDW: 14.4 % (ref 11.5–15.5)
WBC: 5 10*3/uL (ref 4.0–10.5)
nRBC: 0 % (ref 0.0–0.2)

## 2019-10-26 LAB — TROPONIN I (HIGH SENSITIVITY): Troponin I (High Sensitivity): 6 ng/L (ref <18)

## 2019-10-26 NOTE — ED Triage Notes (Signed)
Pt reports intermittent sternal chest pain since 2100 tonight. Pain radiates down left arm and fingertip numbness. Hx of MI and heart surgery

## 2019-10-27 ENCOUNTER — Other Ambulatory Visit: Payer: Self-pay

## 2019-10-27 LAB — TROPONIN I (HIGH SENSITIVITY): Troponin I (High Sensitivity): 8 ng/L (ref <18)

## 2019-10-27 NOTE — ED Notes (Signed)
Patient refused COVID swab. Notified ED provider

## 2019-10-27 NOTE — Discharge Instructions (Addendum)
Please follow up with your cardiologist as scheduled for Monday 10/29/19. Please notify them that you were seen in the ER for chest pain and had a reassuring medical work up.   You should also follow up with your PCP in the next week regarding your nausea and diarrhea.  You should return to the emergency department immediately should your chest pain return or you develop any new or worsening chest pain, shortness of breath, difficulty breathing, or abdominal pain at any point in the next 3 days.
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
## 2019-10-29 ENCOUNTER — Ambulatory Visit: Payer: Self-pay | Admitting: Nurse Practitioner
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
## 2019-10-31 ENCOUNTER — Ambulatory Visit: Payer: Self-pay | Admitting: Nurse Practitioner
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
## 2019-11-01 ENCOUNTER — Other Ambulatory Visit: Payer: Self-pay

## 2019-11-01 ENCOUNTER — Ambulatory Visit (INDEPENDENT_AMBULATORY_CARE_PROVIDER_SITE_OTHER): Payer: Self-pay | Admitting: Interventional Cardiology

## 2019-11-01 ENCOUNTER — Encounter: Payer: Self-pay | Admitting: Interventional Cardiology

## 2019-11-01 VITALS — BP 138/68 | HR 60 | Ht 61.0 in | Wt 176.8 lb

## 2019-11-01 DIAGNOSIS — Z7189 Other specified counseling: Secondary | ICD-10-CM

## 2019-11-01 DIAGNOSIS — Z951 Presence of aortocoronary bypass graft: Secondary | ICD-10-CM

## 2019-11-01 DIAGNOSIS — E782 Mixed hyperlipidemia: Secondary | ICD-10-CM

## 2019-11-01 DIAGNOSIS — R7989 Other specified abnormal findings of blood chemistry: Secondary | ICD-10-CM

## 2019-11-01 DIAGNOSIS — I10 Essential (primary) hypertension: Secondary | ICD-10-CM

## 2019-11-01 MED ORDER — ROSUVASTATIN CALCIUM 40 MG PO TABS: 40.0000 mg | ORAL_TABLET | Freq: Every day | ORAL | 1 refills | Status: DC

## 2019-11-01 NOTE — Patient Instructions (Signed)
Medication Instructions:  1) START Rosuvastatin 40mg  once daily  *If you need a refill on your cardiac medications before your next appointment, please call your pharmacy*   Lab Work: Lipid and Liver at the end of November.  You will need to be fasting for these labs (nothing to eat or drink after midnight except water and black coffee).  If you have labs (blood work) drawn today and your tests are completely normal, you will receive your results only by: December MyChart Message (if you have MyChart) OR . A paper copy in the mail If you have any lab test that is abnormal or we need to change your treatment, we will call you to review the results.   Testing/Procedures: None   Follow-Up: At [facility], you and your health needs are our priority.  As part of our continuing mission to provide you with exceptional heart care, we have created designated Provider Care Teams.  These Care Teams include your primary Cardiologist (physician) and Advanced Practice Providers (APPs -  Physician Assistants and Nurse Practitioners) who all work together to provide you with the care you need, when you need it.  We recommend signing up for the patient portal called "MyChart".  Sign up information is provided on this After Visit Summary.  MyChart is used to connect with patients for Virtual Visits (Telemedicine).  Patients are able to view lab/test results, encounter notes, upcoming appointments, etc.  Non-urgent messages can be sent to your provider as well.   To learn more about what you can do with MyChart, go to [facility].    Your next appointment:   6 month(s)  The format for your next appointment:   In Person  Provider:   You may see [email or web address], MD or one of the following Advanced Practice Providers on your designated Care Team:    Lesleigh Noe, NP  Norma Fredrickson, NP  Nada Boozer, NP    Other Instructions

## 2019-12-25 ENCOUNTER — Telehealth: Payer: Self-pay

## 2019-12-25 ENCOUNTER — Emergency Department (HOSPITAL_COMMUNITY)
Admission: EM | Admit: 2019-12-25 | Discharge: 2019-12-25 | Disposition: A | Discharge status: Home / Self Care | Payer: Self-pay | Attending: Emergency Medicine | Admitting: Emergency Medicine

## 2019-12-25 ENCOUNTER — Emergency Department (HOSPITAL_COMMUNITY): Payer: Self-pay

## 2019-12-25 ENCOUNTER — Other Ambulatory Visit: Payer: Self-pay

## 2019-12-25 ENCOUNTER — Encounter (HOSPITAL_COMMUNITY): Payer: Self-pay

## 2019-12-25 DIAGNOSIS — R200 Anesthesia of skin: Secondary | ICD-10-CM | POA: Insufficient documentation

## 2019-12-25 DIAGNOSIS — I110 Hypertensive heart disease with heart failure: Secondary | ICD-10-CM | POA: Insufficient documentation

## 2019-12-25 DIAGNOSIS — Z955 Presence of coronary angioplasty implant and graft: Secondary | ICD-10-CM | POA: Insufficient documentation

## 2019-12-25 DIAGNOSIS — Z91040 Latex allergy status: Secondary | ICD-10-CM | POA: Insufficient documentation

## 2019-12-25 DIAGNOSIS — I5032 Chronic diastolic (congestive) heart failure: Secondary | ICD-10-CM | POA: Insufficient documentation

## 2019-12-25 DIAGNOSIS — I2510 Atherosclerotic heart disease of native coronary artery without angina pectoris: Secondary | ICD-10-CM | POA: Insufficient documentation

## 2019-12-25 DIAGNOSIS — F1010 Alcohol abuse, uncomplicated: Secondary | ICD-10-CM | POA: Insufficient documentation

## 2019-12-25 DIAGNOSIS — Z79899 Other long term (current) drug therapy: Secondary | ICD-10-CM | POA: Insufficient documentation

## 2019-12-25 DIAGNOSIS — Z7982 Long term (current) use of aspirin: Secondary | ICD-10-CM | POA: Insufficient documentation

## 2019-12-25 DIAGNOSIS — Z87891 Personal history of nicotine dependence: Secondary | ICD-10-CM | POA: Insufficient documentation

## 2019-12-25 LAB — PROTIME-INR
INR: 1 (ref 0.8–1.2)
Prothrombin Time: 13 seconds (ref 11.4–15.2)

## 2019-12-25 LAB — DIFFERENTIAL
Abs Immature Granulocytes: 0.01 10*3/uL (ref 0.00–0.07)
Basophils Absolute: 0 10*3/uL (ref 0.0–0.1)
Basophils Relative: 1 %
Eosinophils Absolute: 0.2 10*3/uL (ref 0.0–0.5)
Eosinophils Relative: 4 %
Immature Granulocytes: 0 %
Lymphocytes Relative: 28 %
Lymphs Abs: 1.1 10*3/uL (ref 0.7–4.0)
Monocytes Absolute: 0.4 10*3/uL (ref 0.1–1.0)
Monocytes Relative: 11 %
Neutro Abs: 2.3 10*3/uL (ref 1.7–7.7)
Neutrophils Relative %: 56 %

## 2019-12-25 LAB — CBC
HCT: 40.8 % (ref 36.0–46.0)
Hemoglobin: 12.7 g/dL (ref 12.0–15.0)
MCH: 28.3 pg (ref 26.0–34.0)
MCHC: 31.1 g/dL (ref 30.0–36.0)
MCV: 90.9 fL (ref 80.0–100.0)
Platelets: 238 10*3/uL (ref 150–400)
RBC: 4.49 MIL/uL (ref 3.87–5.11)
RDW: 14.3 % (ref 11.5–15.5)
WBC: 4 10*3/uL (ref 4.0–10.5)
nRBC: 0 % (ref 0.0–0.2)

## 2019-12-25 LAB — COMPREHENSIVE METABOLIC PANEL
ALT: 14 U/L (ref 0–44)
AST: 19 U/L (ref 15–41)
Albumin: 3.5 g/dL (ref 3.5–5.0)
Alkaline Phosphatase: 54 U/L (ref 38–126)
Anion gap: 10 (ref 5–15)
BUN: 9 mg/dL (ref 6–20)
CO2: 21 mmol/L — ABNORMAL LOW (ref 22–32)
Calcium: 9.2 mg/dL (ref 8.9–10.3)
Chloride: 109 mmol/L (ref 98–111)
Creatinine, Ser: 0.69 mg/dL (ref 0.44–1.00)
GFR, Estimated: >60 mL/min (ref >60)
Glucose, Bld: 113 mg/dL — ABNORMAL HIGH (ref 70–99)
Potassium: 3.5 mmol/L (ref 3.5–5.1)
Sodium: 140 mmol/L (ref 135–145)
Total Bilirubin: 0.4 mg/dL (ref 0.3–1.2)
Total Protein: 6.6 g/dL (ref 6.5–8.1)

## 2019-12-25 LAB — APTT: aPTT: 28 seconds (ref 24–36)

## 2019-12-25 LAB — I-STAT BETA HCG BLOOD, ED (MC, WL, AP ONLY): I-stat hCG, quantitative: 5.4 m[IU]/mL — ABNORMAL HIGH (ref <5)

## 2019-12-25 LAB — ETHANOL: Alcohol, Ethyl (B): <10 mg/dL (ref <10)

## 2019-12-25 LAB — CBG MONITORING, ED: Glucose-Capillary: 81 mg/dL (ref 70–99)

## 2019-12-25 MED ORDER — THIAMINE HCL 100 MG PO TABS: 100.0000 mg | ORAL_TABLET | Freq: Every day | ORAL | 1 refills | Status: AC

## 2019-12-25 MED ORDER — THIAMINE HCL 100 MG PO TABS
100.0000 mg | ORAL_TABLET | Freq: Once | ORAL | Status: AC
  Administered 2019-12-25: 100 mg via ORAL
  Filled 2019-12-25: qty 1

## 2019-12-25 MED ORDER — LORAZEPAM 2 MG/ML IJ SOLN
1.0000 mg | Freq: Once | INTRAMUSCULAR | Status: AC
  Administered 2019-12-25: 1 mg via INTRAVENOUS
  Filled 2019-12-25: qty 1

## 2019-12-25 MED ORDER — CHLORDIAZEPOXIDE HCL 25 MG PO CAPS: ORAL_CAPSULE | ORAL | 0 refills | Status: DC

## 2019-12-25 MED ORDER — THIAMINE HCL 100 MG/ML IJ SOLN: 100.0000 mg | Freq: Once | INTRAMUSCULAR | Status: DC

## 2019-12-25 MED ORDER — GADOBUTROL 1 MMOL/ML IV SOLN
8.5000 mL | Freq: Once | INTRAVENOUS | Status: AC | PRN
  Administered 2019-12-25: 8.5 mL via INTRAVENOUS

## 2019-12-25 MED ORDER — SODIUM CHLORIDE 0.9% FLUSH
3.0000 mL | Freq: Once | INTRAVENOUS | Status: AC
  Administered 2019-12-25: 3 mL via INTRAVENOUS

## 2019-12-25 NOTE — ED Notes (Signed)
MRI called to alert that pt is ready for scan

## 2019-12-25 NOTE — ED Notes (Signed)
E-signature pad unavailable at time of pt discharge. This RN discussed discharge materials with pt and answered all pt questions. Pt stated understanding of discharge material. ? ?

## 2019-12-25 NOTE — ED Notes (Signed)
Pt brought back from MRI and placed back on monitor
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
## 2019-12-25 NOTE — ED Notes (Signed)
Pt c/o "tingeling to both hands" and "I have to think about what I'm trying to say"

## 2019-12-25 NOTE — ED Triage Notes (Signed)
Pt reports about  A week ago she started having numbness in her fingers of her left hand, bilateral blurred vision and problems walking (stumbling). Pt also reports memory issues as well. No facial droop, slurred speech or weakness noted but decreased sensory noted on the entire left side. Pt a.o

## 2019-12-25 NOTE — Telephone Encounter (Signed)
Pt calling requesting samples of Brilinta 90 mg. I informed pt that I could leave her a week supply of samples of brilinta 90 mg for pt to pick up, Lot# [id number], Exp: 03-2022, at the front desk. Pt verbalized understanding.

## 2019-12-25 NOTE — Telephone Encounter (Signed)
Patient come in to pick up her Brilinta samples. Patient requested to talk to a nurse. Went to lobby to see patient. Patient complained of tingling in her left fingers and seeing spots/floaters in her eyes. Patient has miss doses of her Brilinta. Informed patient that our office does not take walk-ins. Encouraged patient to go to ED to be evaluated or at least go to urgent care. Informed patient of signs and symptoms of a stroke. Patient stated she would go get checked out.

## 2019-12-25 NOTE — ED Notes (Signed)
Pt transported to CT ?

## 2020-01-07 ENCOUNTER — Other Ambulatory Visit: Payer: Self-pay

## 2020-02-25 ENCOUNTER — Telehealth: Payer: Self-pay | Admitting: Interventional Cardiology

## 2020-02-25 NOTE — Telephone Encounter (Signed)
Spoke with pt and advised [facility] is recommending the Covid Vaccine for all of our pt's unless they are allergic to a component of the vaccine.  Pt should follow up with PCP re: any additional questions re: vaccine.  Pt verbalizes understanding and thanked Charity fundraiser for call.

## 2020-02-25 NOTE — Telephone Encounter (Signed)
Patient calling to find out if she can take the moderna covid vaccine. Informed her the office is encouraging all patients to take the covid vaccine. She states she still wants to speak with a nurse to make sure. She states her appointment is tomorrow.
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
## 2020-03-12 ENCOUNTER — Ambulatory Visit: Payer: Self-pay | Admitting: Neurology

## 2020-03-12 ENCOUNTER — Telehealth: Payer: Self-pay | Admitting: Neurology

## 2020-03-12 ENCOUNTER — Telehealth: Payer: Self-pay | Admitting: *Deleted

## 2020-03-12 NOTE — Telephone Encounter (Signed)
Pt no showed new patient appt today. This is her first no show.  

## 2020-03-12 NOTE — Telephone Encounter (Signed)
Patient no-showed new appointment in neurology

## 2020-03-13 ENCOUNTER — Encounter: Payer: Self-pay | Admitting: Neurology
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
## 2020-04-21 ENCOUNTER — Ambulatory Visit: Payer: Self-pay | Admitting: Interventional Cardiology

## 2020-04-21 DIAGNOSIS — E782 Mixed hyperlipidemia: Secondary | ICD-10-CM

## 2020-04-21 DIAGNOSIS — Z951 Presence of aortocoronary bypass graft: Secondary | ICD-10-CM

## 2020-04-21 DIAGNOSIS — I5032 Chronic diastolic (congestive) heart failure: Secondary | ICD-10-CM

## 2020-04-21 DIAGNOSIS — G4733 Obstructive sleep apnea (adult) (pediatric): Secondary | ICD-10-CM

## 2020-04-21 DIAGNOSIS — Z720 Tobacco use: Secondary | ICD-10-CM

## 2020-04-21 DIAGNOSIS — I10 Essential (primary) hypertension: Secondary | ICD-10-CM

## 2020-04-21 DIAGNOSIS — F3131 Bipolar disorder, current episode depressed, mild: Secondary | ICD-10-CM

## 2020-08-02 ENCOUNTER — Encounter (HOSPITAL_COMMUNITY): Payer: Self-pay | Admitting: Emergency Medicine

## 2020-08-02 ENCOUNTER — Emergency Department (HOSPITAL_COMMUNITY)
Admission: EM | Admit: 2020-08-02 | Discharge: 2020-08-02 | Disposition: A | Discharge status: Home / Self Care | Payer: Self-pay | Attending: Emergency Medicine | Admitting: Emergency Medicine

## 2020-08-02 ENCOUNTER — Other Ambulatory Visit: Payer: Self-pay

## 2020-08-02 DIAGNOSIS — Z760 Encounter for issue of repeat prescription: Secondary | ICD-10-CM | POA: Insufficient documentation

## 2020-08-02 DIAGNOSIS — I110 Hypertensive heart disease with heart failure: Secondary | ICD-10-CM | POA: Insufficient documentation

## 2020-08-02 DIAGNOSIS — Z951 Presence of aortocoronary bypass graft: Secondary | ICD-10-CM | POA: Insufficient documentation

## 2020-08-02 DIAGNOSIS — I5032 Chronic diastolic (congestive) heart failure: Secondary | ICD-10-CM | POA: Insufficient documentation

## 2020-08-02 DIAGNOSIS — R202 Paresthesia of skin: Secondary | ICD-10-CM | POA: Insufficient documentation

## 2020-08-02 DIAGNOSIS — I2510 Atherosclerotic heart disease of native coronary artery without angina pectoris: Secondary | ICD-10-CM | POA: Insufficient documentation

## 2020-08-02 DIAGNOSIS — Z79899 Other long term (current) drug therapy: Secondary | ICD-10-CM | POA: Insufficient documentation

## 2020-08-02 DIAGNOSIS — Z91040 Latex allergy status: Secondary | ICD-10-CM | POA: Insufficient documentation

## 2020-08-02 DIAGNOSIS — Z87891 Personal history of nicotine dependence: Secondary | ICD-10-CM | POA: Insufficient documentation

## 2020-08-02 DIAGNOSIS — I10 Essential (primary) hypertension: Secondary | ICD-10-CM

## 2020-08-02 DIAGNOSIS — R109 Unspecified abdominal pain: Secondary | ICD-10-CM | POA: Insufficient documentation

## 2020-08-02 MED ORDER — AMLODIPINE BESYLATE 5 MG PO TABS
10.0000 mg | ORAL_TABLET | Freq: Every day | ORAL | 0 refills | Status: DC
Start: 2020-08-02 — End: 2021-10-26

## 2020-08-02 MED ORDER — ESCITALOPRAM OXALATE 20 MG PO TABS: 20.0000 mg | ORAL_TABLET | Freq: Every day | ORAL | 0 refills | Status: DC

## 2020-08-02 NOTE — Discharge Instructions (Addendum)
Followup with your doctor as needed
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
## 2020-08-02 NOTE — ED Triage Notes (Addendum)
Pt states that she has been out of her lexapro x 2 weeks and needs a refill before she can get in with her doc. Pt also endorses tingling in hands, abdominal pain and is hypertensive at 224/105. States she has been out of her BP meds but had a heart attack 2 years ago and 'feels the same as I did then'. Denies SI/HI. Alert and oriented.

## 2020-08-02 NOTE — ED Notes (Addendum)
Attempted IV start, stuck x 1. Pt insisted for RN to take out, "phobia with needles and I dont like digging around". States only here for anti depression/ anxiety meds and endorses ran out of BP medicine, last taken 2 days ago.

## 2021-03-29 ENCOUNTER — Encounter (HOSPITAL_COMMUNITY): Payer: Self-pay | Admitting: Emergency Medicine

## 2021-03-29 ENCOUNTER — Emergency Department (HOSPITAL_COMMUNITY)
Admission: EM | Admit: 2021-03-29 | Discharge: 2021-03-29 | Disposition: A | Discharge status: Home / Self Care | Payer: Self-pay | Attending: Emergency Medicine | Admitting: Emergency Medicine

## 2021-03-29 ENCOUNTER — Other Ambulatory Visit: Payer: Self-pay

## 2021-03-29 DIAGNOSIS — Z7982 Long term (current) use of aspirin: Secondary | ICD-10-CM | POA: Insufficient documentation

## 2021-03-29 DIAGNOSIS — J101 Influenza due to other identified influenza virus with other respiratory manifestations: Secondary | ICD-10-CM | POA: Insufficient documentation

## 2021-03-29 DIAGNOSIS — Z91040 Latex allergy status: Secondary | ICD-10-CM | POA: Insufficient documentation

## 2021-03-29 DIAGNOSIS — Z79899 Other long term (current) drug therapy: Secondary | ICD-10-CM | POA: Insufficient documentation

## 2021-03-29 DIAGNOSIS — Z20822 Contact with and (suspected) exposure to covid-19: Secondary | ICD-10-CM | POA: Insufficient documentation

## 2021-03-29 DIAGNOSIS — N9489 Other specified conditions associated with female genital organs and menstrual cycle: Secondary | ICD-10-CM | POA: Insufficient documentation

## 2021-03-29 LAB — CBC
HCT: 47.6 % — ABNORMAL HIGH (ref 36.0–46.0)
Hemoglobin: 15.2 g/dL — ABNORMAL HIGH (ref 12.0–15.0)
MCH: 28.8 pg (ref 26.0–34.0)
MCHC: 31.9 g/dL (ref 30.0–36.0)
MCV: 90.3 fL (ref 80.0–100.0)
Platelets: 216 10*3/uL (ref 150–400)
RBC: 5.27 MIL/uL — ABNORMAL HIGH (ref 3.87–5.11)
RDW: 13.1 % (ref 11.5–15.5)
WBC: 3.2 10*3/uL — ABNORMAL LOW (ref 4.0–10.5)
nRBC: 0 % (ref 0.0–0.2)

## 2021-03-29 LAB — BASIC METABOLIC PANEL
Anion gap: 10 (ref 5–15)
BUN: 9 mg/dL (ref 6–20)
CO2: 22 mmol/L (ref 22–32)
Calcium: 9.2 mg/dL (ref 8.9–10.3)
Chloride: 104 mmol/L (ref 98–111)
Creatinine, Ser: 0.84 mg/dL (ref 0.44–1.00)
GFR, Estimated: >60 mL/min (ref >60)
Glucose, Bld: 133 mg/dL — ABNORMAL HIGH (ref 70–99)
Potassium: 4.1 mmol/L (ref 3.5–5.1)
Sodium: 136 mmol/L (ref 135–145)

## 2021-03-29 LAB — I-STAT BETA HCG BLOOD, ED (MC, WL, AP ONLY): I-stat hCG, quantitative: 5.3 m[IU]/mL — ABNORMAL HIGH (ref <5)

## 2021-03-29 LAB — RESP PANEL BY RT-PCR (FLU A&B, COVID) ARPGX2
Influenza A by PCR: POSITIVE — AB
Influenza B by PCR: NEGATIVE
SARS Coronavirus 2 by RT PCR: NEGATIVE

## 2021-03-29 MED ORDER — SODIUM CHLORIDE 0.9% FLUSH: 3.0000 mL | Freq: Once | INTRAVENOUS | Status: DC

## 2021-03-29 NOTE — ED Triage Notes (Signed)
Pt reports cough and body aches on Wednesday.  Started taking alka seltzer and ZInc and felt better on Thursday but having diarrhea.  Diarrhea has now resolved.  Reports dizziness and feeling "foggy" x 2 days.  No neuro deficits noted.

## 2021-03-29 NOTE — ED Provider Notes (Signed)
[place] EMERGENCY DEPARTMENT Provider Note   CSN: [id number] Arrival date & time: 03/29/21  1521     History  Chief Complaint  Patient presents with   Dizziness    [name] is a 56 y.o. female presenting from home with 5 days of flulike symptoms.  She reports that began with a cough on Wednesday, subsequently body aches, headache, and for the past 24 hours feeling dizzy.  She is also had loose bowel movements and loss of appetite for the past 2 to 3 days.  She feels that she is in a fog.  No sick contacts in the house, but she does live alone.  HPI     Home Medications Prior to Admission medications   Medication Sig Start Date End Date Taking? Authorizing Provider  albuterol (VENTOLIN HFA) 108 (90 Base) MCG/ACT inhaler Inhale 2 puffs into the lungs every 6 (six) hours as needed for wheezing or shortness of breath. 01/31/19   Lightfoot, Lucile Crater, MD  amLODipine (NORVASC) 5 MG tablet Take 2 tablets (10 mg total) by mouth daily. 08/02/20   Lacretia Leigh, MD  aspirin 81 MG chewable tablet Chew 81 mg by mouth daily.    [provider]  busPIRone (BUSPAR) 10 MG tablet Take 1 tablet (10 mg total) by mouth 3 (three) times daily. 01/25/19   Nani Skillern, PA-C  chlordiazePOXIDE (LIBRIUM) 25 MG capsule 50mg  PO TID x 1D, then 50mg  PO BID X 1D, then 25 mg PO QD X 1D Patient not taking: No sig reported 12/25/19   Curatolo, Adam, DO  escitalopram (LEXAPRO) 20 MG tablet Take 1 tablet (20 mg total) by mouth daily for 15 days. 08/02/20 08/17/20  Lacretia Leigh, MD  ezetimibe (ZETIA) 10 MG tablet Take 1 tablet (10 mg total) by mouth daily. Patient not taking: Reported on 08/02/2020 05/17/19   Leanor Kail, PA  furosemide (LASIX) 20 MG tablet Take 1 tablet (20 mg total) by mouth as needed for fluid or edema. Patient not taking: Reported on 08/02/2020 05/17/19 12/25/19  Leanor Kail, PA  isosorbide mononitrate (IMDUR) 60 MG 24 hr tablet Take 1 tablet  (60 mg total) by mouth daily. Patient not taking: Reported on 08/02/2020 07/08/19   Ledora Bottcher, PA  losartan (COZAAR) 25 MG tablet Take 0.5 tablets (12.5 mg total) by mouth daily. 05/17/19   Bhagat, Crista Luria, PA  metoprolol tartrate (LOPRESSOR) 25 MG tablet Take 1 tablet (25 mg total) by mouth 2 (two) times daily. 05/17/19 08/02/20  Leanor Kail, PA  nitroGLYCERIN (NITROSTAT) 0.4 MG SL tablet Place 1 tablet (0.4 mg total) under the tongue every 5 (five) minutes x 3 doses as needed for chest pain. 05/17/19   Bhagat, Crista Luria, PA  rosuvastatin (CRESTOR) 40 MG tablet Take 1 tablet (40 mg total) by mouth daily. Patient not taking: Reported on 08/02/2020 11/01/19 01/30/20  Belva Crome, MD  ticagrelor (BRILINTA) 90 MG TABS tablet Take 1 tablet (90 mg total) by mouth 2 (two) times daily. Patient not taking: Reported on 08/02/2020 07/08/19   Ledora Bottcher, PA      Allergies    Lidocaine, Codeine, Percocet [oxycodone-acetaminophen], Vicodin [hydrocodone-acetaminophen], 5-alpha reductase inhibitors, Atorvastatin, and Latex    Review of Systems   Review of Systems  Physical Exam Updated Vital Signs BP (!) 170/88    Pulse 64    Temp 98 F (36.7 C) (Oral)    Resp 17    Ht 5\' 1"  (1.549 m)    Wt 84.4  kg    SpO2 99%    BMI 35.14 kg/m  Physical Exam Constitutional:      General: She is not in acute distress. HENT:     Head: Normocephalic and atraumatic.  Eyes:     Conjunctiva/sclera: Conjunctivae normal.     Pupils: Pupils are equal, round, and reactive to light.  Cardiovascular:     Rate and Rhythm: Normal rate and regular rhythm.  Pulmonary:     Effort: Pulmonary effort is normal. No respiratory distress.     Comments: No hypoxia, stable on room air, wet cough Skin:    General: Skin is warm and dry.  Neurological:     General: No focal deficit present.     Mental Status: She is alert and oriented to person, place, and time. Mental status is at baseline.  Psychiatric:         Mood and Affect: Mood normal.        Behavior: Behavior normal.    ED Results / Procedures / Treatments   Labs (all labs ordered are listed, but only abnormal results are displayed) Labs Reviewed  RESP PANEL BY RT-PCR (FLU A&B, COVID) ARPGX2 - Abnormal; Notable for the following components:      Result Value   Influenza A by PCR POSITIVE (*)    All other components within normal limits  BASIC METABOLIC PANEL - Abnormal; Notable for the following components:   Glucose, Bld 133 (*)    All other components within normal limits  CBC - Abnormal; Notable for the following components:   WBC 3.2 (*)    RBC 5.27 (*)    Hemoglobin 15.2 (*)    HCT 47.6 (*)    All other components within normal limits  I-STAT BETA HCG BLOOD, ED ([facility], [facility], [facility] ONLY) - Abnormal; Notable for the following components:   I-stat hCG, quantitative 5.3 (*)    All other components within normal limits  URINALYSIS, ROUTINE W REFLEX MICROSCOPIC  CBG MONITORING, ED    EKG EKG Interpretation  Date/Time:  Sunday March 29 2021 15:28:00 EST Ventricular Rate:  71 PR Interval:  120 QRS Duration: 102 QT Interval:  378 QTC Calculation: 410 R Axis:   10 Text Interpretation: Normal sinus rhythm T wave inversions inferior/lateral leads noted on June 2022 tracing, no acute ischemic changes on this ecg Abnormal ECG When compared with ECG of 02-Aug-2020 10:53, PREVIOUS ECG IS PRESENT Confirmed by Octaviano Glow [phone]) on 03/29/2021 4:24:39 PM  Radiology No results found.  Procedures Procedures    Medications Ordered in ED Medications  sodium chloride flush (NS) 0.9 % injection 3 mL (has no administration in time range)    ED Course/ Medical Decision Making/ A&P                           Medical Decision Making Amount and/or Complexity of Data Reviewed Labs: ordered.   Patient is here with flulike symptoms, found to be influenza positive on her testing.  This is consistent with all of her symptoms.  Have a  lower suspicion for sepsis or bacterial pneumonia.  Do not believe that we need x-ray imaging of the chest at this time.  She is afebrile and does not have a leukocytosis to suggest sepsis.  I personally reviewed and interpreted her labs.  She is very mild leukopenia.  Electrolytes within normal limits.  EKG shows normal sinus rhythm without acute ischemic findings.  I doubt this is  ACS.  Patient is stable for discharge.  Recommended continued conservative measures at home, quarantining for 7 full days, work note was provided.  She verbalized understanding and agreement with the plan        Final Clinical Impression(s) / ED Diagnoses Final diagnoses:  Influenza A    Rx / DC Orders ED Discharge Orders     None         Wyvonnia Dusky, MD 03/29/21 1900

## 2021-05-15 ENCOUNTER — Emergency Department (HOSPITAL_COMMUNITY): Payer: No Typology Code available for payment source

## 2021-05-15 ENCOUNTER — Other Ambulatory Visit: Payer: Self-pay

## 2021-05-15 ENCOUNTER — Encounter (HOSPITAL_COMMUNITY): Payer: Self-pay | Admitting: Emergency Medicine

## 2021-05-15 ENCOUNTER — Emergency Department (HOSPITAL_COMMUNITY)
Admission: EM | Admit: 2021-05-15 | Discharge: 2021-05-16 | Disposition: A | Discharge status: Home / Self Care | Payer: No Typology Code available for payment source | Attending: Emergency Medicine | Admitting: Emergency Medicine

## 2021-05-15 DIAGNOSIS — R079 Chest pain, unspecified: Secondary | ICD-10-CM

## 2021-05-15 DIAGNOSIS — M79662 Pain in left lower leg: Secondary | ICD-10-CM | POA: Diagnosis not present

## 2021-05-15 DIAGNOSIS — Z951 Presence of aortocoronary bypass graft: Secondary | ICD-10-CM | POA: Insufficient documentation

## 2021-05-15 DIAGNOSIS — R112 Nausea with vomiting, unspecified: Secondary | ICD-10-CM | POA: Diagnosis not present

## 2021-05-15 DIAGNOSIS — I5032 Chronic diastolic (congestive) heart failure: Secondary | ICD-10-CM | POA: Insufficient documentation

## 2021-05-15 DIAGNOSIS — I110 Hypertensive heart disease with heart failure: Secondary | ICD-10-CM | POA: Insufficient documentation

## 2021-05-15 DIAGNOSIS — I2510 Atherosclerotic heart disease of native coronary artery without angina pectoris: Secondary | ICD-10-CM | POA: Insufficient documentation

## 2021-05-15 DIAGNOSIS — R197 Diarrhea, unspecified: Secondary | ICD-10-CM | POA: Diagnosis not present

## 2021-05-15 DIAGNOSIS — M79605 Pain in left leg: Secondary | ICD-10-CM

## 2021-05-15 DIAGNOSIS — Z87442 Personal history of urinary calculi: Secondary | ICD-10-CM | POA: Insufficient documentation

## 2021-05-15 DIAGNOSIS — F17210 Nicotine dependence, cigarettes, uncomplicated: Secondary | ICD-10-CM | POA: Diagnosis not present

## 2021-05-15 LAB — COMPREHENSIVE METABOLIC PANEL
ALT: 13 U/L (ref 0–44)
AST: 21 U/L (ref 15–41)
Albumin: 4 g/dL (ref 3.5–5.0)
Alkaline Phosphatase: 60 U/L (ref 38–126)
Anion gap: 7 (ref 5–15)
BUN: 6 mg/dL (ref 6–20)
CO2: 26 mmol/L (ref 22–32)
Calcium: 9.6 mg/dL (ref 8.9–10.3)
Chloride: 106 mmol/L (ref 98–111)
Creatinine, Ser: 0.78 mg/dL (ref 0.44–1.00)
GFR, Estimated: >60 mL/min (ref >60)
Glucose, Bld: 125 mg/dL — ABNORMAL HIGH (ref 70–99)
Potassium: 4.5 mmol/L (ref 3.5–5.1)
Sodium: 139 mmol/L (ref 135–145)
Total Bilirubin: 0.7 mg/dL (ref 0.3–1.2)
Total Protein: 7.2 g/dL (ref 6.5–8.1)

## 2021-05-15 LAB — CBC WITH DIFFERENTIAL/PLATELET
Abs Immature Granulocytes: 0.01 10*3/uL (ref 0.00–0.07)
Basophils Absolute: 0 10*3/uL (ref 0.0–0.1)
Basophils Relative: 1 %
Eosinophils Absolute: 0.1 10*3/uL (ref 0.0–0.5)
Eosinophils Relative: 2 %
HCT: 46.8 % — ABNORMAL HIGH (ref 36.0–46.0)
Hemoglobin: 15 g/dL (ref 12.0–15.0)
Immature Granulocytes: 0 %
Lymphocytes Relative: 21 %
Lymphs Abs: 1.2 10*3/uL (ref 0.7–4.0)
MCH: 28.7 pg (ref 26.0–34.0)
MCHC: 32.1 g/dL (ref 30.0–36.0)
MCV: 89.7 fL (ref 80.0–100.0)
Monocytes Absolute: 0.3 10*3/uL (ref 0.1–1.0)
Monocytes Relative: 5 %
Neutro Abs: 3.9 10*3/uL (ref 1.7–7.7)
Neutrophils Relative %: 71 %
Platelets: 275 10*3/uL (ref 150–400)
RBC: 5.22 MIL/uL — ABNORMAL HIGH (ref 3.87–5.11)
RDW: 13.2 % (ref 11.5–15.5)
WBC: 5.4 10*3/uL (ref 4.0–10.5)
nRBC: 0 % (ref 0.0–0.2)

## 2021-05-15 LAB — D-DIMER, QUANTITATIVE: D-Dimer, Quant: 0.55 ug/mL-FEU — ABNORMAL HIGH (ref 0.00–0.50)

## 2021-05-15 LAB — MAGNESIUM: Magnesium: 2 mg/dL (ref 1.7–2.4)

## 2021-05-15 LAB — TROPONIN I (HIGH SENSITIVITY): Troponin I (High Sensitivity): 7 ng/L (ref <18)

## 2021-05-15 MED ORDER — LORAZEPAM 2 MG/ML IJ SOLN
1.0000 mg | Freq: Once | INTRAMUSCULAR | Status: AC
  Administered 2021-05-15: 1 mg via INTRAVENOUS
  Filled 2021-05-15: qty 1
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
## 2021-05-15 NOTE — ED Triage Notes (Signed)
Pt states she started having pain on her calf a week ago and today she started having mid cp with nausea and vomiting, denies SOB. ?
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
## 2021-05-16 LAB — TROPONIN I (HIGH SENSITIVITY): Troponin I (High Sensitivity): 8 ng/L (ref <18)

## 2021-05-16 MED ORDER — IOHEXOL 350 MG/ML SOLN: 50.0000 mL | Freq: Once | INTRAVENOUS | Status: DC | PRN

## 2021-05-16 MED ORDER — IOHEXOL 350 MG/ML SOLN
50.0000 mL | Freq: Once | INTRAVENOUS | Status: AC | PRN
  Administered 2021-05-16: 50 mL via INTRAVENOUS

## 2021-05-16 NOTE — Discharge Instructions (Signed)
You were evaluated in the Emergency Department and after careful evaluation, we did not find any emergent condition requiring admission or further testing in the hospital. ? ?Your exam/testing today is overall reassuring.  No signs of heart damage or blood clots.  Recommend close follow-up with your regular doctors. ? ?We discussed the adrenal gland abnormality on your CT scan, recommend informing your primary care doctors so they can arrange any future imaging necessary. ? ?Please return to the Emergency Department if you experience any worsening of your condition.   Thank you for allowing [place] to be a part of your care. ?

## 2021-10-05 ENCOUNTER — Encounter (HOSPITAL_COMMUNITY): Payer: Self-pay | Admitting: Registered Nurse

## 2021-10-05 ENCOUNTER — Ambulatory Visit (HOSPITAL_COMMUNITY)
Admission: EM | Admit: 2021-10-05 | Discharge: 2021-10-05 | Disposition: A | Discharge status: Home / Self Care | Payer: No Typology Code available for payment source | Attending: Registered Nurse | Admitting: Registered Nurse

## 2021-10-05 DIAGNOSIS — F1020 Alcohol dependence, uncomplicated: Secondary | ICD-10-CM | POA: Diagnosis present

## 2021-10-05 DIAGNOSIS — F314 Bipolar disorder, current episode depressed, severe, without psychotic features: Secondary | ICD-10-CM | POA: Diagnosis not present

## 2021-10-05 MED ORDER — DIVALPROEX SODIUM ER 500 MG PO TB24
500.0000 mg | ORAL_TABLET | Freq: Three times a day (TID) | ORAL | Status: DC
  Filled 2021-10-05 (×3): qty 21

## 2021-10-05 NOTE — BH Assessment (Signed)
LCSW Progress Note   Per Shuvon Rankin, NP, this pt does not require psychiatric hospitalization at this time.  Pt is psychiatrically cleared.  Discharge instructions include several outpatient resources for therapy, medication management, trauma therapist, and the [facility] [place].  The mobile crisis number and 211 were provided.  EDP Shuvon Rankin, NP, has been notified.  Hansel Starling, MSW, LCSW [facility] [phone] or [phone]

## 2021-10-05 NOTE — ED Provider Notes (Signed)
Behavioral Health Urgent Care Medical Screening Exam  Patient Name: [name] MRN: [id number] Date of Evaluation: 10/05/21 Chief Complaint:   Diagnosis:  Final diagnoses:  Severe bipolar I disorder, current or most recent episode depressed (HCC)  Alcohol use disorder, moderate, in controlled environment [facility])    History of Present illness: [name] is a 56 y.o. female patient presented to [facility] as a walk in with complaints of worsening depression, alcohol use disorder and being out of her medication  [name], 56 y.o., female patient seen face to face by this provider, consulted with Dr. Nelly Rout; and chart reviewed on 10/05/21.  On evaluation [name] reports She has been off of her medication (Depakote ER 500 mg Tid) for at least a month.  States that her prescription was called in last week but she was unable to get it filled because she did not have the money and won't have the money until the end of this week (Friday).  States her depression has been worsening and there was an episode this weekend after a conversation with her brother.  Reports she feels as if everything is her fault.  "After the conversation with my brother I felt like everything was my fault.  I feel like because of my mental situation I escalated everything and I just felt pathetic.  After that I drank more."  Patient states that she usually drinks about 3 glasses of wine daily but "This weekend I think I drank 4 bottles a wine a day."  Patient states that she does have alcohol problem but "I'm able to go week or so with out drinking anything.  But that's what I do after work I go home drink wine and look at TV."  States she has children but they blame me for everything and are not supportive.  Patient denies suicidal/self-harm/homicidal ideation, psychosis, and paranoia.  States that her medications are prescriped by Mee Hives, NP at the [facility] but she would like to get psychiatric  services "I need to talk to someone that is good with trauma, substance abuse, depression, and bipolar."   During evaluation [name] is sitting upright in chair with no noted distress.  She is alert/oriented x 4; calm/cooperative; and mood congruent with affect.  She is speaking in a clear tone at moderate volume, and normal pace; with good eye contact.  Her thought process is coherent and relevant; There is no indication that she is currently responding to internal/external stimuli or experiencing delusional thought content.  She denies suicidal/self-harm/homicidal ideation, psychosis, and paranoia.    [name] is educated and verbalizes understanding of mental health resources and other crisis services in the community. She is instructed to call 911 and present to the nearest emergency room should she experience any suicidal/homicidal ideation, auditory/visual/hallucinations, or detrimental worsening of her mental health condition.  She was a also advised by Clinical research associate that she could call the toll-free phone on insurance card to assist with identifying in network counselors and agencies for outpatient psychiatric services.  Resources also given.    Psychiatric Specialty Exam  Presentation  General Appearance:Appropriate for Environment; Casual  Eye Contact:Good  Speech:Clear and Coherent; Normal Rate  Speech Volume:Normal  Handedness:Right   Mood and Affect  Mood:Dysphoric  Affect:Appropriate; Congruent   Thought Process  Thought Processes:Coherent; Goal Directed  Descriptions of Associations:Intact  Orientation:Full (Time, Place and Person)  Thought Content:Logical    Hallucinations:None  Ideas of Reference:None  Suicidal Thoughts:No  Homicidal Thoughts:No  Sensorium  Memory:Immediate Good; Recent Good; Remote Good  Judgment:Intact  Insight:Present   Executive Functions  Concentration:Good  Attention Span:Good  Recall:Good  Fund of  Knowledge:Good  Language:Good   Psychomotor Activity  Psychomotor Activity:Normal   Assets  Assets:Communication Skills; Desire for Improvement; Housing; Health and safety inspector; Resilience; Transportation   Sleep  Sleep:Good  Number of hours: No data recorded  Nutritional Assessment (For OBS and FBC admissions only) Has the patient had a weight loss or gain of 10 pounds or more in the last 3 months?: No Has the patient had a decrease in food intake/or appetite?: No Does the patient have dental problems?: No Does the patient have eating habits or behaviors that may be indicators of an eating disorder including binging or inducing vomiting?: No Has the patient recently lost weight without trying?: 0 Has the patient been eating poorly because of a decreased appetite?: 0 Malnutrition Screening Tool Score: 0    Physical Exam: Physical Exam Vitals and nursing note reviewed.  Constitutional:      General: She is not in acute distress.    Appearance: Normal appearance. She is not ill-appearing.  Cardiovascular:     Rate and Rhythm: Normal rate.  Pulmonary:     Effort: Pulmonary effort is normal.  Skin:    General: Skin is warm and dry.  Neurological:     Mental Status: She is alert and oriented to person, place, and time.  Psychiatric:        Attention and Perception: Attention and perception normal. She does not perceive auditory or visual hallucinations.        Mood and Affect: Affect normal. Mood is depressed.        Speech: Speech normal.        Behavior: Behavior normal. Behavior is cooperative.        Thought Content: Thought content normal. Thought content is not paranoid or delusional. Thought content does not include homicidal or suicidal ideation.        Cognition and Memory: Cognition and memory normal.        Judgment: Judgment is impulsive.    Review of Systems  Constitutional: Negative.   Respiratory: Negative.    Cardiovascular: Negative.    Gastrointestinal: Negative.   Musculoskeletal: Negative.   Skin: Negative.   Neurological: Negative.  Negative for tremors, seizures and headaches.  Endo/Heme/Allergies: Negative.   Psychiatric/Behavioral:  Positive for depression (Worsening depression) and suicidal ideas (Denies). Hallucinations: Denies. Substance abuse: Alcohol almost daily but has been worse past weekend.The patient is nervous/anxious (Stable). The patient does not have insomnia.    Blood pressure (!) 183/95, pulse 87, temperature 98.2 F (36.8 C), temperature source Oral, resp. rate 19, SpO2 100 %. There is no height or weight on file to calculate BMI.  Musculoskeletal: Strength & Muscle Tone: within normal limits Gait & Station: normal Patient leans: N/A   BHUC MSE Discharge Disposition for Follow up and Recommendations: Based on my evaluation the patient does not appear to have an emergency medical condition and can be discharged with resources and follow up care in outpatient services for Medication Management, Substance Abuse Intensive Outpatient Program, and Individual Therapy    Discharge Instructions      You were given a 7 day sample of Depakote ER 500 mg to be taken three times a day.  Please follow up with pre scriber and resources given for outpatient psychiatric services listed below  It is imperative that you follow through with treatment recommendations within 7-10 days  from the day of discharge to mitigate further risk to your safety and overall mental well-being.  A list of outpatient therapy and psychiatric providers for medication management has been provided below to get you started in finding the right provider for you.  You have a right to provider choice.  You can also call your insurance carrier and find what providers are covered under your policy. In case of an urgent emergency, you have the option of contacting the Mobile Crisis Unit with [facility] at  [phone].  You can also dial 211 or go to www.nc211.org for additional community resources in [place] and around [place].  One of the most recommended in [facility] for dealing with trauma in a non-invasive manner along with therapy and medication management is:  [facility] [address], [address] [place], [place], [address] [phone] phone ([address], [place], [address] [employer], [place], [place], [place], [employer], [employer], and certain Express Scripts)        You will need to ask them if they can accept your insurance.  The ones listed above are a few that they do accept.     [facility] [employer] [facility] [employer] [address] [place], [place], [address] [phone] phone  Offers Marketing executive, Women to Work Program, [employer], [employer], and Support Groups.   [facility] is women helping women to navigate life's hurdles, to  access community services, to develop new skill sets, to move lives forward.  [facility] is your connection point to a vast network of services available in our community.  Whatever the challenge, find the help you need - whether you are dealing with divorce, returning to work, starting a business, dealing with financial difficulties, or coping with family or health problems.    [facility] [address] [place], [place], [address] [phone] phone [email or web address].[email or web address]/   [facility] [address] [place], [place], [address] [phone] phone (Medicare, Private insurance except Tricare, [facility], and [facility])  [facility] [address] [place], [place], [address] [phone] phone (Call to confirm insurance coverage) Consultation & Support Services     o Drop-In Hours: 1:00 PM to 5:00 PM     o Days: Monday -  Thursday  Crisis Services (24/7)    [facility] [address], [address] [place], [place], [address] [phone] phone [email or web address]  (to complete the intake form and upload ID and insurance cards)  [facility] [address] [place], [place], [address] [phone] phone [phone] fax (Medicaid, Medicare, Self-pay, call about other insurance coverage)  [facility] [place] [address], [place], [address] [phone] phone (Medicare, Medicaid, [place], [place], call about other insurance coverage)  [facility] [address] [place], [place], [address] [phone] phone [phone] fax (Call [phone] to see what insurance is accepted) Archer Asa, MD specializes in geropsych)  [facility] [address] [place], [place], [address] [phone] phone (Call about insurance coverage)  [facility] [address] [place], [place], [address] [phone] phone [phone] fax (Call about insurance coverage)  [facility] [address] [place], [place], [address] [phone] phone [phone] fax (Call about insurance coverage)  [facility] [address] [place], [place], [address] [phone] phone (Medicaid, Medicare, call about other insurance coverage)  [facility] [address] [place], [place], [address] [phone] phone [phone] fax (Medicaid, Medicare, Tricare, call about other insurance coverage)   [email or web address]  - for additional providers who specialize in trauma therapy.       Orlena Garmon, NP 10/05/2021, 11:35 AM

## 2021-10-05 NOTE — Discharge Instructions (Addendum)
You were given a 7 day sample of Depakote ER 500 mg to be taken three times a day.  Please follow up with pre scriber and resources given for outpatient psychiatric services listed below  It is imperative that you follow through with treatment recommendations within 7-10 days from the day of discharge to mitigate further risk to your safety and overall mental well-being.  A list of outpatient therapy and psychiatric providers for medication management has been provided below to get you started in finding the right provider for you.  You have a right to provider choice.  You can also call your insurance carrier and find what providers are covered under your policy. In case of an urgent emergency, you have the option of contacting the Mobile Crisis Unit with [facility] at [phone].  You can also dial 211 or go to www.nc211.org for additional community resources in [place] and around [place].  One of the most recommended in [facility] for dealing with trauma in a non-invasive manner along with therapy and medication management is:  [facility] [address] [place], [place], [address] [phone] phone ([address], [address], [place], [address] [facility], [place], [place], [place], U[employer], [place], and certain Express Scripts)        You will need to ask them if they can accept your insurance.  The ones listed above are a few that they do accept.     [facility] [employer] [facility] [employer] [address] [place], [place], [address] [phone] phone  Offers Marketing executive, Women to Work Program, [employer], [employer], and Support Groups.   [facility] is women helping women to navigate life's hurdles, to  access community services, to develop new skill sets, to move lives forward.  [facility] is your connection point to a vast network of services available in our  community.  Whatever the challenge, find the help you need - whether you are dealing with divorce, returning to work, starting a business, dealing with financial difficulties, or coping with family or health problems.    [facility] [address] [place], [place], [address] [phone] phone [email or web address]   [facility] [address] [place], [place], [address] [phone] phone (Medicare, Private insurance except Tricare, [facility], and [facility])  [facility] [address] [place], [place], [address] [phone] phone (Call to confirm insurance coverage) Consultation & Support Services     o Drop-In Hours: 1:00 PM to 5:00 PM     o Days: Monday - Thursday  Crisis Services (24/7)    [facility] [address] [place], [place], [address] [phone] phone [email or web address]  (to complete the intake form and upload ID and insurance cards)  [facility] [address] [place], [place], [address] [phone] phone [phone] fax (Medicaid, Medicare, Self-pay, call about other insurance coverage)  [facility] [place], [place] [address], [place], [address] [phone] phone (Medicare, Medicaid, [place], [place], call about other insurance coverage)  [facility] [address] [place], [place], [address] [phone] phone [phone] fax (Call [phone] to see what insurance is accepted) Archer Asa, MD specializes in geropsych)  [facility] [address] [place], [place], [address] [phone] phone (Call about insurance coverage)  [facility] [address] [place], [place], [address] [phone] phone [phone] fax (Call  about insurance  coverage)  [facility] [address] [place], [place], [address] [phone] phone [phone] fax (Call about insurance coverage)  [facility] [address] [place], [place], [address] [phone] phone (Medicaid, Medicare, call about other insurance coverage)  [facility] [address] [place], [place], [address] [phone] phone [phone] fax (Medicaid, Medicare, Tricare, call about other insurance coverage)   [email or web address]  - for additional providers who specialize in trauma therapy.

## 2021-10-05 NOTE — ED Notes (Signed)
Patient discharged to home by provider with written and verbal instructions. Patient denies SI/HI and AVH at the time of discharge. Sample medication given with written and verbal instructions.

## 2021-10-05 NOTE — BH Assessment (Signed)
Pt reports not feeling well mentally. Pt has been off bipolar medicaitons for over a month and reports drinking a bottle of wine a day and 4 bottles of wine on the weekend. Pt reports having a conversation with her brother yesterday and today she has alot of guilt. Pt denies SI,HI,AVH.
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
## 2021-10-26 ENCOUNTER — Encounter: Payer: Self-pay | Admitting: Nurse Practitioner

## 2021-10-26 ENCOUNTER — Ambulatory Visit: Payer: Self-pay | Attending: Nurse Practitioner | Admitting: Nurse Practitioner

## 2021-10-26 VITALS — BP 148/80 | HR 68 | Ht 61.0 in | Wt 164.0 lb

## 2021-10-26 DIAGNOSIS — I2510 Atherosclerotic heart disease of native coronary artery without angina pectoris: Secondary | ICD-10-CM

## 2021-10-26 DIAGNOSIS — I10 Essential (primary) hypertension: Secondary | ICD-10-CM

## 2021-10-26 DIAGNOSIS — E782 Mixed hyperlipidemia: Secondary | ICD-10-CM

## 2021-10-26 DIAGNOSIS — F1020 Alcohol dependence, uncomplicated: Secondary | ICD-10-CM

## 2021-10-26 LAB — COMPREHENSIVE METABOLIC PANEL
ALT: 12 IU/L (ref 0–32)
AST: 21 IU/L (ref 0–40)
Albumin/Globulin Ratio: 1.7 (ref 1.2–2.2)
Albumin: 4.4 g/dL (ref 3.8–4.9)
Alkaline Phosphatase: 88 IU/L (ref 44–121)
BUN/Creatinine Ratio: 15 (ref 9–23)
BUN: 10 mg/dL (ref 6–24)
Bilirubin Total: <0.2 mg/dL (ref 0.0–1.2)
CO2: 22 mmol/L (ref 20–29)
Calcium: 10.2 mg/dL (ref 8.7–10.2)
Chloride: 103 mmol/L (ref 96–106)
Creatinine, Ser: 0.68 mg/dL (ref 0.57–1.00)
Globulin, Total: 2.6 g/dL (ref 1.5–4.5)
Glucose: 103 mg/dL — ABNORMAL HIGH (ref 70–99)
Potassium: 4.4 mmol/L (ref 3.5–5.2)
Sodium: 138 mmol/L (ref 134–144)
Total Protein: 7 g/dL (ref 6.0–8.5)
eGFR: 103 mL/min/{1.73_m2} (ref >59)

## 2021-10-26 LAB — LIPID PANEL
Chol/HDL Ratio: 4.1 ratio (ref 0.0–4.4)
Cholesterol, Total: 205 mg/dL — ABNORMAL HIGH (ref 100–199)
HDL: 50 mg/dL (ref >39)
LDL Chol Calc (NIH): 122 mg/dL — ABNORMAL HIGH (ref 0–99)
Triglycerides: 188 mg/dL — ABNORMAL HIGH (ref 0–149)
VLDL Cholesterol Cal: 33 mg/dL (ref 5–40)

## 2021-10-26 MED ORDER — CARVEDILOL 3.125 MG PO TABS: 3.1250 mg | ORAL_TABLET | Freq: Two times a day (BID) | ORAL | 1 refills | Status: DC

## 2021-10-26 MED ORDER — CLOPIDOGREL BISULFATE 75 MG PO TABS: ORAL_TABLET | ORAL | 3 refills | Status: DC

## 2021-10-26 NOTE — Patient Instructions (Signed)
Medication Instructions:  Your physician has recommended you make the following change in your medication:   START Plavix (Clopidogrel) 75 mg taking 4 tablets on day 1 then 1 tablet daily after that  START Carvedilol 3.125 taking 1 tablet twice a day   STOP Lopressor(Metoprolol)  *If you need a refill on your cardiac medications before your next appointment, please call your pharmacy*   Lab Work: TODAY:  CMET & LIPID  If you have labs (blood work) drawn today and your tests are completely normal, you will receive your results only by: [place] (if you have MyChart) OR A paper copy in the mail If you have any lab test that is abnormal or we need to change your treatment, we will call you to review the results.   Testing/Procedures: None ordered   Follow-Up: At [facility], you and your health needs are our priority.  As part of our continuing mission to provide you with exceptional heart care, we have created designated Provider Care Teams.  These Care Teams include your primary Cardiologist (physician) and Advanced Practice Providers (APPs -  Physician Assistants and Nurse Practitioners) who all work together to provide you with the care you need, when you need it.  We recommend signing up for the patient portal called "MyChart".  Sign up information is provided on this After Visit Summary.  MyChart is used to connect with patients for Virtual Visits (Telemedicine).  Patients are able to view lab/test results, encounter notes, upcoming appointments, etc.  Non-urgent messages can be sent to your provider as well.   To learn more about what you can do with MyChart, go to [email or web address].    Your next appointment:   1 month(s)  The format for your next appointment:   In Person  Provider:   Ambrose Pancoast, NP         Other Instructions   Important Information About Sugar

## 2021-10-28 ENCOUNTER — Telehealth: Payer: Self-pay | Admitting: *Deleted

## 2021-10-28 DIAGNOSIS — E785 Hyperlipidemia, unspecified: Secondary | ICD-10-CM

## 2021-10-28 DIAGNOSIS — E782 Mixed hyperlipidemia: Secondary | ICD-10-CM

## 2021-10-28 DIAGNOSIS — Z79899 Other long term (current) drug therapy: Secondary | ICD-10-CM

## 2021-10-28 MED ORDER — CARVEDILOL 3.125 MG PO TABS: 3.1250 mg | ORAL_TABLET | Freq: Two times a day (BID) | ORAL | 3 refills | Status: DC

## 2021-10-28 MED ORDER — ROSUVASTATIN CALCIUM 40 MG PO TABS: 40.0000 mg | ORAL_TABLET | Freq: Every day | ORAL | 3 refills | Status: DC

## 2021-10-28 MED ORDER — AMLODIPINE BESYLATE 10 MG PO TABS: 10.0000 mg | ORAL_TABLET | Freq: Every day | ORAL | 3 refills | Status: DC

## 2021-10-28 NOTE — Telephone Encounter (Signed)
-----   Message from Marylu Lund., NP sent at 10/26/2021  8:13 PM EDT ----- Liver function is normal however your cholesterol is not at goal.  Triglycerides are 188 and LDL is 122.  Plan: -Please order Crestor 40 mg daily for patient -Repeat LFTs and lipids in 6-8 weeks -Please decrease carbohydrates and sweets, increase consumption of fiber, cold water fish such as salmon, and olive oil to increase HDL levels.  Ambrose Pancoast, NP
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
## 2021-11-23 ENCOUNTER — Ambulatory Visit: Payer: Self-pay | Admitting: Nurse Practitioner

## 2021-11-23 DIAGNOSIS — I2510 Atherosclerotic heart disease of native coronary artery without angina pectoris: Secondary | ICD-10-CM

## 2021-11-25 ENCOUNTER — Ambulatory Visit: Payer: Self-pay | Admitting: Nurse Practitioner
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
## 2021-11-30 ENCOUNTER — Encounter: Payer: Self-pay | Admitting: Nurse Practitioner

## 2021-11-30 ENCOUNTER — Ambulatory Visit: Payer: Commercial Managed Care - HMO | Attending: Nurse Practitioner | Admitting: Nurse Practitioner

## 2021-11-30 VITALS — BP 134/70 | HR 81 | Ht 61.0 in | Wt 164.6 lb

## 2021-11-30 DIAGNOSIS — E782 Mixed hyperlipidemia: Secondary | ICD-10-CM | POA: Diagnosis not present

## 2021-11-30 DIAGNOSIS — I10 Essential (primary) hypertension: Secondary | ICD-10-CM

## 2021-11-30 DIAGNOSIS — F1010 Alcohol abuse, uncomplicated: Secondary | ICD-10-CM | POA: Diagnosis not present

## 2021-11-30 DIAGNOSIS — Z951 Presence of aortocoronary bypass graft: Secondary | ICD-10-CM | POA: Diagnosis not present

## 2021-11-30 MED ORDER — FUROSEMIDE 20 MG PO TABS: 20.0000 mg | ORAL_TABLET | Freq: Every day | ORAL | 2 refills | Status: DC

## 2021-11-30 NOTE — Patient Instructions (Addendum)
Medication Instructions:    START TAKING:  LASIX 20 MG ONCE A DAY   *If you need a refill on your cardiac medications before your next appointment, please call your pharmacy*   Lab Work: NONE ORDERED  TODAY   If you have labs (blood work) drawn today and your tests are completely normal, you will receive your results only by: MyChart Message (if you have MyChart) OR A paper copy in the mail If you have any lab test that is abnormal or we need to change your treatment, we will call you to review the results.   Testing/Procedures: NONE ORDERED  TODAY    Follow-Up: At [facility], you and your health needs are our priority.  As part of our continuing mission to provide you with exceptional heart care, we have created designated Provider Care Teams.  These Care Teams include your primary Cardiologist (physician) and Advanced Practice Providers (APPs -  Physician Assistants and Nurse Practitioners) who all work together to provide you with the care you need, when you need it.  We recommend signing up for the patient portal called "MyChart".  Sign up information is provided on this After Visit Summary.  MyChart is used to connect with patients for Virtual Visits (Telemedicine).  Patients are able to view lab/test results, encounter notes, upcoming appointments, etc.  Non-urgent messages can be sent to your provider as well.   To learn more about what you can do with MyChart, go to [email or web address].    Your next appointment:   6 month(s)  The format for your next appointment:   In Person  Provider:    Robin Searing, NP      Other Instructions:   Low-Sodium Eating Plan Sodium, which is an element that makes up salt, helps you maintain a healthy balance of fluids in your body. Too much sodium can increase your blood pressure and cause fluid and waste to be held in your body. Your health care provider or dietitian may recommend following this plan if you have high blood  pressure (hypertension), kidney disease, liver disease, or heart failure. Eating less sodium can help lower your blood pressure, reduce swelling, and protect your heart, liver, and kidneys. What are tips for following this plan? Reading food labels The Nutrition Facts label lists the amount of sodium in one serving of the food. If you eat more than one serving, you must multiply the listed amount of sodium by the number of servings. Choose foods with less than 140 mg of sodium per serving. Avoid foods with 300 mg of sodium or more per serving. Shopping  Look for lower-sodium products, often labeled as "low-sodium" or "no salt added." Always check the sodium content, even if foods are labeled as "unsalted" or "no salt added." Buy fresh foods. Avoid canned foods and pre-made or frozen meals. Avoid canned, cured, or processed meats. Buy breads that have less than 80 mg of sodium per slice. Cooking  Eat more home-cooked food and less restaurant, buffet, and fast food. Avoid adding salt when cooking. Use salt-free seasonings or herbs instead of table salt or sea salt. Check with your health care provider or pharmacist before using salt substitutes. Cook with plant-based oils, such as canola, sunflower, or olive oil. Meal planning When eating at a restaurant, ask that your food be prepared with less salt or no salt, if possible. Avoid dishes labeled as brined, pickled, cured, smoked, or made with soy sauce, miso, or teriyaki sauce. Avoid foods that contain  MSG (monosodium glutamate). MSG is sometimes added to [place] food, bouillon, and some canned foods. Make meals that can be grilled, baked, poached, roasted, or steamed. These are generally made with less sodium. General information Most people on this plan should limit their sodium intake to 1,500-2,000 mg (milligrams) of sodium each day. What foods should I eat? Fruits Fresh, frozen, or canned fruit. Fruit juice. Vegetables Fresh or frozen  vegetables. "No salt added" canned vegetables. "No salt added" tomato sauce and paste. Low-sodium or reduced-sodium tomato and vegetable juice. Grains Low-sodium cereals, including oats, puffed wheat and rice, and shredded wheat. Low-sodium crackers. Unsalted rice. Unsalted pasta. Low-sodium bread. Whole-grain breads and whole-grain pasta. Meats and other proteins Fresh or frozen (no salt added) meat, poultry, seafood, and fish. Low-sodium canned tuna and salmon. Unsalted nuts. Dried peas, beans, and lentils without added salt. Unsalted canned beans. Eggs. Unsalted nut butters. Dairy Milk. Soy milk. Cheese that is naturally low in sodium, such as ricotta cheese, fresh mozzarella, or Swiss cheese. Low-sodium or reduced-sodium cheese. Cream cheese. Yogurt. Seasonings and condiments Fresh and dried herbs and spices. Salt-free seasonings. Low-sodium mustard and ketchup. Sodium-free salad dressing. Sodium-free light mayonnaise. Fresh or refrigerated horseradish. Lemon juice. Vinegar. Other foods Homemade, reduced-sodium, or low-sodium soups. Unsalted popcorn and pretzels. Low-salt or salt-free chips. The items listed above may not be a complete list of foods and beverages you can eat. Contact a dietitian for more information. What foods should I avoid? Vegetables Sauerkraut, pickled vegetables, and relishes. Olives. [place] fries. Onion rings. Regular canned vegetables (not low-sodium or reduced-sodium). Regular canned tomato sauce and paste (not low-sodium or reduced-sodium). Regular tomato and vegetable juice (not low-sodium or reduced-sodium). Frozen vegetables in sauces. Grains Instant hot cereals. Bread stuffing, pancake, and biscuit mixes. Croutons. Seasoned rice or pasta mixes. Noodle soup cups. Boxed or frozen macaroni and cheese. Regular salted crackers. Self-rising flour. Meats and other proteins Meat or fish that is salted, canned, smoked, spiced, or pickled. Precooked or cured meat, such as  sausages or meat loaves. Berniece Salines. Ham. Pepperoni. Hot dogs. Corned beef. Chipped beef. Salt pork. Jerky. Pickled herring. Anchovies and sardines. Regular canned tuna. Salted nuts. Dairy Processed cheese and cheese spreads. Hard cheeses. Cheese curds. Blue cheese. Feta cheese. String cheese. Regular cottage cheese. Buttermilk. Canned milk. Fats and oils Salted butter. Regular margarine. Ghee. Bacon fat. Seasonings and condiments Onion salt, garlic salt, seasoned salt, table salt, and sea salt. Canned and packaged gravies. Worcestershire sauce. Tartar sauce. Barbecue sauce. Teriyaki sauce. Soy sauce, including reduced-sodium. Steak sauce. Fish sauce. Oyster sauce. Cocktail sauce. Horseradish that you find on the shelf. Regular ketchup and mustard. Meat flavorings and tenderizers. Bouillon cubes. Hot sauce. Pre-made or packaged marinades. Pre-made or packaged taco seasonings. Relishes. Regular salad dressings. Salsa. Other foods Salted popcorn and pretzels. Corn chips and puffs. Potato and tortilla chips. Canned or dried soups. Pizza. Frozen entrees and pot pies. The items listed above may not be a complete list of foods and beverages you should avoid. Contact a dietitian for more information. Summary Eating less sodium can help lower your blood pressure, reduce swelling, and protect your heart, liver, and kidneys. Most people on this plan should limit their sodium intake to 1,500-2,000 mg (milligrams) of sodium each day. Canned, boxed, and frozen foods are high in sodium. Restaurant foods, fast foods, and pizza are also very high in sodium. You also get sodium by adding salt to food. Try to cook at home, eat more fresh fruits and vegetables, and eat  less fast food and canned, processed, or prepared foods. This information is not intended to replace advice given to you by your health care provider. Make sure you discuss any questions you have with your health care provider. Document Revised: 03/02/2019  Document Reviewed: 12/27/2018 Elsevier Patient Education  2023 [employer] Inc.  Important Information About Sugar

## 2021-12-21 ENCOUNTER — Emergency Department (HOSPITAL_COMMUNITY)
Admission: EM | Admit: 2021-12-21 | Discharge: 2021-12-21 | Disposition: A | Discharge status: Home / Self Care | Payer: Commercial Managed Care - HMO | Attending: Emergency Medicine | Admitting: Emergency Medicine

## 2021-12-21 ENCOUNTER — Other Ambulatory Visit: Payer: Self-pay

## 2021-12-21 ENCOUNTER — Encounter (HOSPITAL_COMMUNITY): Payer: Self-pay

## 2021-12-21 DIAGNOSIS — Z720 Tobacco use: Secondary | ICD-10-CM | POA: Diagnosis not present

## 2021-12-21 DIAGNOSIS — Z91040 Latex allergy status: Secondary | ICD-10-CM | POA: Diagnosis not present

## 2021-12-21 DIAGNOSIS — Z79899 Other long term (current) drug therapy: Secondary | ICD-10-CM | POA: Insufficient documentation

## 2021-12-21 DIAGNOSIS — Z7982 Long term (current) use of aspirin: Secondary | ICD-10-CM | POA: Diagnosis not present

## 2021-12-21 DIAGNOSIS — I2510 Atherosclerotic heart disease of native coronary artery without angina pectoris: Secondary | ICD-10-CM | POA: Insufficient documentation

## 2021-12-21 DIAGNOSIS — Z7902 Long term (current) use of antithrombotics/antiplatelets: Secondary | ICD-10-CM | POA: Diagnosis not present

## 2021-12-21 DIAGNOSIS — Z760 Encounter for issue of repeat prescription: Secondary | ICD-10-CM | POA: Diagnosis not present

## 2021-12-21 DIAGNOSIS — I509 Heart failure, unspecified: Secondary | ICD-10-CM | POA: Diagnosis not present

## 2021-12-21 MED ORDER — ESCITALOPRAM OXALATE 20 MG PO TABS: 20.0000 mg | ORAL_TABLET | Freq: Every day | ORAL | 0 refills | Status: DC

## 2021-12-21 MED ORDER — DIVALPROEX SODIUM ER 500 MG PO TB24: 500.0000 mg | ORAL_TABLET | Freq: Three times a day (TID) | ORAL | 0 refills | Status: DC

## 2021-12-21 NOTE — ED Provider Notes (Signed)
[facility]-EMERGENCY DEPT Provider Note   CSN: [id number] Arrival date & time: 12/21/21  1019     History  Chief Complaint  Patient presents with   Medication Refill    [name] is a 56 y.o. female.  Patient is a 56 year old Fe female with a history of CAD, bipolar disease, tobacco use, MI, CHF who is presenting today requesting a refill of her psychiatric medications.  She ran out of her Lexapro 5 days ago and reports she is just feeling a numb tingly sensation which she reports is happened in the past when she has run out of her Lexapro and had none.  She is also low on her Depakote and reports she attempted to contact her psychiatrist but they cannot see her for another 2 weeks.  She reports feeling irritable because she cannot get seen but she denies any SI, HI.  She has no other complaints at this time.  The history is provided by the patient.  Medication Refill      Home Medications Prior to Admission medications   Medication Sig Start Date End Date Taking? Authorizing Provider  albuterol (VENTOLIN HFA) 108 (90 Base) MCG/ACT inhaler Inhale 2 puffs into the lungs every 6 (six) hours as needed for wheezing or shortness of breath. 01/31/19   Lightfoot, Eliezer Lofts, MD  amLODipine (NORVASC) 10 MG tablet Take 1 tablet (10 mg total) by mouth daily. 10/28/21   Gaston Islam., NP  aspirin 81 MG chewable tablet Chew 81 mg by mouth daily.    [provider]  busPIRone (BUSPAR) 10 MG tablet Take 1 tablet (10 mg total) by mouth 3 (three) times daily. 01/25/19   Ardelle Balls, PA-C  carvedilol (COREG) 3.125 MG tablet Take 1 tablet (3.125 mg total) by mouth 2 (two) times daily. 10/28/21   Gaston Islam., NP  chlordiazePOXIDE (LIBRIUM) 25 MG capsule 50mg  PO TID x 1D, then 50mg  PO BID X 1D, then 25 mg PO QD X 1D Patient not taking: Reported on 11/30/2021 12/25/19   12/02/2021, DO  clopidogrel (PLAVIX) 75 MG tablet Take 4 tablets by mouth on  day 1 then take 1 tablet by mouth daily thereafter 10/26/21   Virgina Norfolk., NP  divalproex (DEPAKOTE ER) 500 MG 24 hr tablet Take 1 tablet (500 mg total) by mouth 3 (three) times daily. 12/21/21   Gaston Islam, MD  escitalopram (LEXAPRO) 20 MG tablet Take 1 tablet (20 mg total) by mouth daily for 15 days. 12/21/21 01/05/22  12/23/21, MD  ezetimibe (ZETIA) 10 MG tablet Take 1 tablet (10 mg total) by mouth daily. 05/17/19   Bhagat, Gwyneth Sprout, PA  furosemide (LASIX) 20 MG tablet Take 1 tablet (20 mg total) by mouth daily. 11/30/21 02/28/22  12/02/21., NP  isosorbide mononitrate (IMDUR) 60 MG 24 hr tablet Take 1 tablet (60 mg total) by mouth daily. 07/08/19   Duke, Gaston Islam, PA  losartan (COZAAR) 25 MG tablet Take 0.5 tablets (12.5 mg total) by mouth daily. 05/17/19   Bhagat, Roe Rutherford, PA  nitroGLYCERIN (NITROSTAT) 0.4 MG SL tablet Place 1 tablet (0.4 mg total) under the tongue every 5 (five) minutes x 3 doses as needed for chest pain. 05/17/19   Bhagat, Sharrell Ku, PA  rosuvastatin (CRESTOR) 40 MG tablet Take 1 tablet (40 mg total) by mouth daily. 10/28/21   Sharrell Ku., NP      Allergies    Lidocaine, Codeine, Percocet [oxycodone-acetaminophen], Vicodin [  hydrocodone-acetaminophen], 5-alpha reductase inhibitors, Atorvastatin, and Latex    Review of Systems   Review of Systems  Physical Exam Updated Vital Signs BP (!) 160/86   Pulse 71   Temp 98.2 F (36.8 C) (Oral)   Resp 18   Ht 5\' 1"  (1.549 m)   Wt 74.8 kg   SpO2 100%   BMI 31.18 kg/m  Physical Exam Vitals and nursing note reviewed.  Constitutional:      General: She is not in acute distress.    Appearance: She is well-developed.  HENT:     Head: Normocephalic and atraumatic.  Eyes:     Pupils: Pupils are equal, round, and reactive to light.  Cardiovascular:     Rate and Rhythm: Normal rate and regular rhythm.     Heart sounds: Normal heart sounds. No murmur heard.    No friction rub.   Pulmonary:     Effort: Pulmonary effort is normal.     Breath sounds: Normal breath sounds. No wheezing or rales.  Abdominal:     General: Bowel sounds are normal. There is no distension.     Palpations: Abdomen is soft.     Tenderness: There is no abdominal tenderness. There is no guarding or rebound.  Musculoskeletal:        General: No tenderness. Normal range of motion.     Comments: No edema  Skin:    General: Skin is warm and dry.     Findings: No rash.  Neurological:     Mental Status: She is alert and oriented to person, place, and time. Mental status is at baseline.     Cranial Nerves: No cranial nerve deficit.  Psychiatric:        Mood and Affect: Mood normal.        Behavior: Behavior normal.     Comments: No SI, HI.  She is responding appropriately.  Not responding to internal stimuli.     ED Results / Procedures / Treatments   Labs (all labs ordered are listed, but only abnormal results are displayed) Labs Reviewed - No data to display  EKG None  Radiology No results found.  Procedures Procedures    Medications Ordered in ED Medications - No data to display  ED Course/ Medical Decision Making/ A&P                           Medical Decision Making Risk Prescription drug management.   Patient here today because she has ran out of her Lexapro and is requesting a refill.  She cannot see her [facility] psychiatrist for another 2 weeks and also reports she is about ready to run out of her Depakote.  Patient denies any other acute symptoms at this time.  She does not appear to have a decompensation of her mental health and is not complaining of any acute medical issues at this time.  Other than being hypertensive at 160/86 vital signs are otherwise normal.  Patient was given refill of her prescriptions and discharged home.        Final Clinical Impression(s) / ED Diagnoses Final diagnoses:  Medication refill    Rx / DC Orders ED Discharge Orders           Ordered    divalproex (DEPAKOTE ER) 500 MG 24 hr tablet  3 times daily        12/21/21 1231    escitalopram (LEXAPRO) 20 MG tablet  Daily  12/21/21 1231              Blanchie Dessert, MD 12/21/21 1234

## 2021-12-21 NOTE — Discharge Instructions (Signed)
Your prescriptions were sent to your pharmacy

## 2021-12-21 NOTE — ED Triage Notes (Signed)
Patient states she is out of her medications (Depakote and Lexapro). Patient states she has not had x 1 week. Patient states her psychiatrist can not see her for another 2 weeks.  Patient states she is feeling irritable.

## 2021-12-28 ENCOUNTER — Ambulatory Visit: Payer: Commercial Managed Care - HMO | Attending: Interventional Cardiology

## 2021-12-28 DIAGNOSIS — Z79899 Other long term (current) drug therapy: Secondary | ICD-10-CM

## 2021-12-28 DIAGNOSIS — E782 Mixed hyperlipidemia: Secondary | ICD-10-CM

## 2021-12-29 ENCOUNTER — Telehealth: Payer: Self-pay | Admitting: Nurse Practitioner

## 2021-12-29 LAB — HEPATIC FUNCTION PANEL
ALT: 8 IU/L (ref 0–32)
AST: 15 IU/L (ref 0–40)
Albumin: 4.6 g/dL (ref 3.8–4.9)
Alkaline Phosphatase: 67 IU/L (ref 44–121)
Bilirubin Total: <0.2 mg/dL (ref 0.0–1.2)
Bilirubin, Direct: <0.1 mg/dL (ref 0.00–0.40)
Total Protein: 7.1 g/dL (ref 6.0–8.5)

## 2021-12-29 LAB — LIPID PANEL
Chol/HDL Ratio: 2.5 ratio (ref 0.0–4.4)
Cholesterol, Total: 118 mg/dL (ref 100–199)
HDL: 47 mg/dL (ref >39)
LDL Chol Calc (NIH): 56 mg/dL (ref 0–99)
Triglycerides: 73 mg/dL (ref 0–149)
VLDL Cholesterol Cal: 15 mg/dL (ref 5–40)

## 2021-12-29 NOTE — Telephone Encounter (Signed)
Call placed to patient but mailbox is full.  Unable to leave a message

## 2021-12-29 NOTE — Telephone Encounter (Signed)
Patient returning call for lab results. 

## 2021-12-30 NOTE — Telephone Encounter (Signed)
Pt called back however she states she she listened the VM she received yesterday and was informed that everything was normal

## 2021-12-30 NOTE — Telephone Encounter (Signed)
Attempted to reach the patient but no answer and unable to leave a message.

## 2021-12-30 NOTE — Telephone Encounter (Signed)
Attempted to contact patient, no answer and unable to leave a message. Voicemail is full.  Unsure if patient needs callback as phone note states patient listened to voicemail stating lab results were normal.  Results also shared with patient via MyChart.

## 2022-01-04 ENCOUNTER — Other Ambulatory Visit: Payer: Self-pay

## 2022-01-04 MED ORDER — LOSARTAN POTASSIUM 25 MG PO TABS: 12.5000 mg | ORAL_TABLET | Freq: Every day | ORAL | 2 refills | Status: DC

## 2022-01-04 MED ORDER — EZETIMIBE 10 MG PO TABS: 10.0000 mg | ORAL_TABLET | Freq: Every day | ORAL | 2 refills | Status: DC

## 2022-01-04 MED ORDER — NITROGLYCERIN 0.4 MG SL SUBL: 0.4000 mg | SUBLINGUAL_TABLET | SUBLINGUAL | 1 refills | Status: DC | PRN

## 2022-01-04 MED ORDER — ISOSORBIDE MONONITRATE ER 60 MG PO TB24: 60.0000 mg | ORAL_TABLET | Freq: Every day | ORAL | 2 refills | Status: DC

## 2022-01-04 NOTE — Telephone Encounter (Signed)
Spoke with the patient today and gave her lab results. She voiced understanding.  (See lab result on 12/28/21)

## 2022-01-26 ENCOUNTER — Emergency Department (HOSPITAL_COMMUNITY)
Admission: EM | Admit: 2022-01-26 | Discharge: 2022-01-26 | Disposition: A | Discharge status: Home / Self Care | Payer: Commercial Managed Care - HMO

## 2022-01-26 ENCOUNTER — Emergency Department (HOSPITAL_COMMUNITY): Payer: Commercial Managed Care - HMO

## 2022-01-26 ENCOUNTER — Encounter (HOSPITAL_COMMUNITY): Payer: Self-pay | Admitting: *Deleted

## 2022-01-26 ENCOUNTER — Telehealth: Payer: Self-pay

## 2022-01-26 ENCOUNTER — Other Ambulatory Visit: Payer: Self-pay

## 2022-01-26 ENCOUNTER — Emergency Department (HOSPITAL_COMMUNITY)
Admission: EM | Admit: 2022-01-26 | Discharge: 2022-01-26 | Discharge status: Against Medical Advice | Payer: Commercial Managed Care - HMO | Attending: Emergency Medicine | Admitting: Emergency Medicine

## 2022-01-26 DIAGNOSIS — Z5321 Procedure and treatment not carried out due to patient leaving prior to being seen by health care provider: Secondary | ICD-10-CM | POA: Insufficient documentation

## 2022-01-26 DIAGNOSIS — R209 Unspecified disturbances of skin sensation: Secondary | ICD-10-CM | POA: Insufficient documentation

## 2022-01-26 DIAGNOSIS — M546 Pain in thoracic spine: Secondary | ICD-10-CM | POA: Insufficient documentation

## 2022-01-26 DIAGNOSIS — R079 Chest pain, unspecified: Secondary | ICD-10-CM | POA: Diagnosis present

## 2022-01-26 LAB — BASIC METABOLIC PANEL
Anion gap: 9 (ref 5–15)
BUN: 9 mg/dL (ref 6–20)
CO2: 23 mmol/L (ref 22–32)
Calcium: 9.5 mg/dL (ref 8.9–10.3)
Chloride: 108 mmol/L (ref 98–111)
Creatinine, Ser: 0.86 mg/dL (ref 0.44–1.00)
GFR, Estimated: >60 mL/min (ref >60)
Glucose, Bld: 118 mg/dL — ABNORMAL HIGH (ref 70–99)
Potassium: 4.4 mmol/L (ref 3.5–5.1)
Sodium: 140 mmol/L (ref 135–145)

## 2022-01-26 LAB — CBC
HCT: 44.7 % (ref 36.0–46.0)
Hemoglobin: 13.9 g/dL (ref 12.0–15.0)
MCH: 29 pg (ref 26.0–34.0)
MCHC: 31.1 g/dL (ref 30.0–36.0)
MCV: 93.1 fL (ref 80.0–100.0)
Platelets: 271 10*3/uL (ref 150–400)
RBC: 4.8 MIL/uL (ref 3.87–5.11)
RDW: 14.5 % (ref 11.5–15.5)
WBC: 3.6 10*3/uL — ABNORMAL LOW (ref 4.0–10.5)
nRBC: 0 % (ref 0.0–0.2)

## 2022-01-26 LAB — I-STAT BETA HCG BLOOD, ED (MC, WL, AP ONLY): I-stat hCG, quantitative: <5 m[IU]/mL (ref <5)

## 2022-01-26 LAB — TROPONIN I (HIGH SENSITIVITY): Troponin I (High Sensitivity): 5 ng/L (ref <18)

## 2022-01-26 NOTE — Telephone Encounter (Signed)
Pt walked into [facility] office after checking into [facility] ED and being told there was a 5 hour wait.  Pt complains of chest heaviness this morning as well as numbness in her fingers x 2 days.  Pt states she was diagnosed with the Flu on Saturday.  She denies SOB or dizziness at this time.  Pt states she drove herself to the office. Pt advised due to current symptoms she should be seen in the ED.  Recommended she have someone else drive her or call EMS.  Pt states she will drive herself and verbalizes understanding and agrees with plan to be seen in the ED and to not wait.

## 2022-01-26 NOTE — ED Triage Notes (Signed)
This morning pt woke with something sitting on her chest and a burning feeling, numbness in left finger tips. Mild back pain. No vomiting, nausea noted. Pt recovering from flu, started on antibiotics on Sat night.

## 2022-01-26 NOTE — ED Provider Triage Note (Signed)
Emergency Medicine Provider Triage Evaluation Note  [name] , a 56 y.o. female  was evaluated in triage.  History includes CABG x 4, obesity, CHF, OSA, bipolar, GERD, hyperlipidemia.   Patient presents to the ER for evaluation of central chest pressure ongoing x 2-3 days.  Pain will radiate to her left arm she reports some tingling in her left fingers.  She denies similar pain.  She does report diagnosis of influenza 4 days ago she reports she is simultaneously being treated with an antibiotic for a UTI.   Review of Systems  Positive: Chest pain, left finger tingling Negative: Fever, chills, cough/hemoptysis, shortness of breath, extremity swelling/color change or any additional concerns  Physical Exam  BP 135/84 (BP Location: Left Arm)   Pulse (!) 58   Temp 98.2 F (36.8 C) (Oral)   Resp 18   Ht 5\' 1"  (1.549 m)   Wt 73.9 kg   SpO2 98%   BMI 30.80 kg/m  Gen:   Awake, no distress   Resp:  Normal effort lungs clear MSK:   Moves extremities without difficulty, no TTP or swelling of the bilateral calves. Other:  Mild TTP of the left upper abdomen.  No tenderness of the chest.  Heart regular rate and rhythm.  Medical Decision Making  Medically screening exam initiated at 11:46 AM.  Appropriate orders placed.  [name] was informed that the remainder of the evaluation will be completed by another provider, this initial triage assessment does not replace that evaluation, and the importance of remaining in the ED until their evaluation is complete.     Note: Portions of this report may have been transcribed using voice recognition software. Every effort was made to ensure accuracy; however, inadvertent computerized transcription errors may still be present.    [name], PA-C 01/26/22 1152

## 2022-05-22 ENCOUNTER — Emergency Department (HOSPITAL_COMMUNITY)
Admission: EM | Admit: 2022-05-22 | Discharge: 2022-05-22 | Disposition: A | Discharge status: Home / Self Care | Payer: BLUE CROSS/BLUE SHIELD | Attending: Emergency Medicine | Admitting: Emergency Medicine

## 2022-05-22 ENCOUNTER — Emergency Department (HOSPITAL_COMMUNITY): Payer: BLUE CROSS/BLUE SHIELD

## 2022-05-22 ENCOUNTER — Other Ambulatory Visit: Payer: Self-pay

## 2022-05-22 DIAGNOSIS — I110 Hypertensive heart disease with heart failure: Secondary | ICD-10-CM | POA: Diagnosis not present

## 2022-05-22 DIAGNOSIS — R1031 Right lower quadrant pain: Secondary | ICD-10-CM | POA: Insufficient documentation

## 2022-05-22 DIAGNOSIS — R110 Nausea: Secondary | ICD-10-CM | POA: Insufficient documentation

## 2022-05-22 DIAGNOSIS — R197 Diarrhea, unspecified: Secondary | ICD-10-CM | POA: Insufficient documentation

## 2022-05-22 DIAGNOSIS — R531 Weakness: Secondary | ICD-10-CM | POA: Insufficient documentation

## 2022-05-22 DIAGNOSIS — Z91040 Latex allergy status: Secondary | ICD-10-CM | POA: Diagnosis not present

## 2022-05-22 DIAGNOSIS — Z79899 Other long term (current) drug therapy: Secondary | ICD-10-CM | POA: Insufficient documentation

## 2022-05-22 DIAGNOSIS — R2689 Other abnormalities of gait and mobility: Secondary | ICD-10-CM | POA: Diagnosis not present

## 2022-05-22 DIAGNOSIS — Z7982 Long term (current) use of aspirin: Secondary | ICD-10-CM | POA: Diagnosis not present

## 2022-05-22 DIAGNOSIS — I509 Heart failure, unspecified: Secondary | ICD-10-CM | POA: Insufficient documentation

## 2022-05-22 DIAGNOSIS — R2681 Unsteadiness on feet: Secondary | ICD-10-CM

## 2022-05-22 DIAGNOSIS — Z951 Presence of aortocoronary bypass graft: Secondary | ICD-10-CM | POA: Diagnosis not present

## 2022-05-22 LAB — URINALYSIS, ROUTINE W REFLEX MICROSCOPIC
Bilirubin Urine: NEGATIVE
Glucose, UA: NEGATIVE mg/dL
Hgb urine dipstick: NEGATIVE
Ketones, ur: NEGATIVE mg/dL
Leukocytes,Ua: NEGATIVE
Nitrite: NEGATIVE
Protein, ur: NEGATIVE mg/dL
Specific Gravity, Urine: 1.01 (ref 1.005–1.030)
pH: 7 (ref 5.0–8.0)

## 2022-05-22 LAB — COMPREHENSIVE METABOLIC PANEL
ALT: 18 U/L (ref 0–44)
AST: 23 U/L (ref 15–41)
Albumin: 3.7 g/dL (ref 3.5–5.0)
Alkaline Phosphatase: 63 U/L (ref 38–126)
Anion gap: 13 (ref 5–15)
BUN: 8 mg/dL (ref 6–20)
CO2: 23 mmol/L (ref 22–32)
Calcium: 9.4 mg/dL (ref 8.9–10.3)
Chloride: 104 mmol/L (ref 98–111)
Creatinine, Ser: 0.66 mg/dL (ref 0.44–1.00)
GFR, Estimated: >60 mL/min (ref >60)
Glucose, Bld: 99 mg/dL (ref 70–99)
Potassium: 4.1 mmol/L (ref 3.5–5.1)
Sodium: 140 mmol/L (ref 135–145)
Total Bilirubin: 0.5 mg/dL (ref 0.3–1.2)
Total Protein: 7.2 g/dL (ref 6.5–8.1)

## 2022-05-22 LAB — CBC
HCT: 41 % (ref 36.0–46.0)
Hemoglobin: 13.1 g/dL (ref 12.0–15.0)
MCH: 28.9 pg (ref 26.0–34.0)
MCHC: 32 g/dL (ref 30.0–36.0)
MCV: 90.3 fL (ref 80.0–100.0)
Platelets: 291 10*3/uL (ref 150–400)
RBC: 4.54 MIL/uL (ref 3.87–5.11)
RDW: 13 % (ref 11.5–15.5)
WBC: 4.2 10*3/uL (ref 4.0–10.5)
nRBC: 0 % (ref 0.0–0.2)

## 2022-05-22 LAB — TROPONIN I (HIGH SENSITIVITY): Troponin I (High Sensitivity): 6 ng/L (ref <18)

## 2022-05-22 LAB — TSH: TSH: 1.384 u[IU]/mL (ref 0.350–4.500)

## 2022-05-22 LAB — VITAMIN B12: Vitamin B-12: 639 pg/mL (ref 180–914)

## 2022-05-22 LAB — LIPASE, BLOOD: Lipase: 43 U/L (ref 11–51)

## 2022-05-22 MED ORDER — DICYCLOMINE HCL 10 MG PO CAPS
10.0000 mg | ORAL_CAPSULE | Freq: Once | ORAL | Status: AC
  Administered 2022-05-22: 10 mg via ORAL
  Filled 2022-05-22: qty 1

## 2022-05-22 MED ORDER — DICYCLOMINE HCL 20 MG PO TABS: 20.0000 mg | ORAL_TABLET | Freq: Two times a day (BID) | ORAL | 0 refills | Status: DC

## 2022-05-22 MED ORDER — LACTATED RINGERS IV BOLUS
1000.0000 mL | Freq: Once | INTRAVENOUS | Status: AC
  Administered 2022-05-22: 1000 mL via INTRAVENOUS

## 2022-05-22 MED ORDER — ONDANSETRON 4 MG PO TBDP: 4.0000 mg | ORAL_TABLET | Freq: Three times a day (TID) | ORAL | 0 refills | Status: DC | PRN

## 2022-05-22 MED ORDER — ONDANSETRON HCL 4 MG/2ML IJ SOLN
4.0000 mg | Freq: Once | INTRAMUSCULAR | Status: DC
  Filled 2022-05-22: qty 2

## 2022-05-22 MED ORDER — LORAZEPAM 1 MG PO TABS
1.0000 mg | ORAL_TABLET | ORAL | Status: AC | PRN
  Administered 2022-05-22: 1 mg via ORAL
  Filled 2022-05-22: qty 1

## 2022-05-22 NOTE — Discharge Instructions (Addendum)
Your CT imaging of the head and MRI of the brain was negative for acute stroke.  Did show evidence of old stroke.  Recommend outpatient follow-up with your primary care provider.  Regarding your diarrhea, recommend continued oral rehydration, Bentyl for abdominal cramping and Zofran for nausea.

## 2022-05-22 NOTE — ED Triage Notes (Signed)
Pt reports lower abdominal pain x 1 week and nausea without vomiting, diarrhea, weakness, and fatigue x 2-3 days. States every time she eats or drinks anything she immediately has diarrhea. 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

## 2022-05-22 NOTE — ED Notes (Signed)
Patient transported to MRI 

## 2022-05-22 NOTE — ED Notes (Signed)
Patient transported to CT 

## 2022-05-22 NOTE — ED Provider Notes (Signed)
[place] EMERGENCY DEPARTMENT AT [facility] Provider Note   CSN: [id number] Arrival date & time: 05/22/22  7564     History  Chief Complaint  Patient presents with   Diarrhea   Weakness    [name] is a 57 y.o. female.   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  The patient states that she has been feeling fatigued with associated nausea and a diarrheal illness with multiple episodes of watery diarrhea for the last 2 to 3 days.  Yesterday around 10 AM she noticed a sensation of feeling off balance, feeling like she was going to fall to the left side.  She also endorsed some left facial numbness.  The symptoms have persisted since 10 AM yesterday.  She denies any facial droop, weakness or numbness in her arms or legs.  She has had intermittent abdominal cramping and discomfort (for months) and endorses some crampy discomfort in the right lower quadrant today that is not new or severe.  She is not vomiting.  She denies any dysuria or increased urinary frequency.  No fevers or chills.  No vaginal bleeding or vaginal discharge.  She feels mildly dehydrated in the setting of her diarrheal illness.  Home Medications Prior to Admission medications   Medication Sig Start Date End Date Taking? Authorizing Provider  dicyclomine (BENTYL) 20 MG tablet Take 1 tablet (20 mg total) by mouth 2 (two) times daily. 05/22/22  Yes Ernie Avena, MD  ondansetron (ZOFRAN-ODT) 4 MG disintegrating tablet Take 1 tablet (4 mg total) by mouth every 8 (eight) hours as needed for nausea or vomiting. 05/22/22  Yes Ernie Avena, MD  albuterol (VENTOLIN HFA) 108  (90 Base) MCG/ACT inhaler Inhale 2 puffs into the lungs every 6 (six) hours as needed for wheezing or shortness of breath. 01/31/19   Lightfoot, Eliezer Lofts, MD  amLODipine (NORVASC) 10 MG tablet Take 1 tablet (10 mg total) by mouth daily. 10/28/21   Gaston Islam., NP  aspirin 81 MG chewable tablet Chew 81 mg by mouth daily.    [provider]  busPIRone (BUSPAR) 10 MG tablet Take 1 tablet (10 mg total) by mouth 3 (three) times daily. 01/25/19   Ardelle Balls, PA-C  carvedilol (COREG) 3.125 MG tablet Take 1 tablet (3.125 mg total) by mouth 2 (two) times daily. 10/28/21   Gaston Islam., NP  chlordiazePOXIDE (LIBRIUM) 25 MG capsule 50mg  PO TID x 1D, then 50mg  PO BID X 1D, then 25 mg PO QD X 1D Patient not taking: Reported on 11/30/2021 12/25/19   Virgina Norfolk, DO  clopidogrel (PLAVIX) 75 MG tablet Take 4 tablets by mouth on day 1 then take 1 tablet by mouth daily thereafter 10/26/21   Gaston Islam., NP  divalproex (DEPAKOTE ER) 500 MG 24 hr tablet Take 1 tablet (500 mg total) by mouth 3 (three) times daily. 12/21/21   Gwyneth Sprout, MD  escitalopram (LEXAPRO) 20 MG tablet Take 1 tablet (20 mg total) by mouth daily for 15 days. 12/21/21 01/05/22  Gwyneth Sprout, MD  ezetimibe (ZETIA) 10 MG tablet Take 1 tablet (10 mg total) by mouth daily. 01/04/22   Gaston Islam., NP  furosemide Delorse Limber)  20 MG tablet Take 1 tablet (20 mg total) by mouth daily. 11/30/21 02/28/22  Gaston Islam., NP  isosorbide mononitrate (IMDUR) 60 MG 24 hr tablet Take 1 tablet (60 mg total) by mouth daily. 01/04/22   Gaston Islam., NP  losartan (COZAAR) 25 MG tablet Take 0.5 tablets (12.5 mg total) by mouth daily. 01/04/22   Gaston Islam., NP  nitroGLYCERIN (NITROSTAT) 0.4 MG SL tablet Place 1 tablet (0.4 mg total) under the tongue every 5 (five) minutes x 3 doses as needed for chest pain. 01/04/22   Gaston Islam., NP  rosuvastatin (CRESTOR) 40 MG tablet Take 1 tablet (40 mg  total) by mouth daily. 10/28/21   Gaston Islam., NP      Allergies    Lidocaine, Codeine, Percocet [oxycodone-acetaminophen], Vicodin [hydrocodone-acetaminophen], 5-alpha reductase inhibitors, Atorvastatin, and Latex    Review of Systems   Review of Systems  Gastrointestinal:  Positive for abdominal pain, diarrhea and nausea.  Neurological:  Positive for weakness.  All other systems reviewed and are negative.   Physical Exam Updated Vital Signs BP (!) 159/86   Pulse 71   Temp 98.1 F (36.7 C) (Oral)   Resp 12   LMP  (LMP Unknown)   SpO2 98%  Physical Exam Vitals and nursing note reviewed.  Constitutional:      General: She is not in acute distress.    Appearance: She is well-developed.  HENT:     Head: Normocephalic and atraumatic.  Eyes:     Conjunctiva/sclera: Conjunctivae normal.  Cardiovascular:     Rate and Rhythm: Normal rate and regular rhythm.     Pulses: Normal pulses.  Pulmonary:     Effort: Pulmonary effort is normal. No respiratory distress.     Breath sounds: Normal breath sounds.  Abdominal:     Palpations: Abdomen is soft.     Tenderness: There is no abdominal tenderness.     Comments: Abdomen soft, nontender, nondistended, no rebound or guarding  Musculoskeletal:        General: No swelling.     Cervical back: Neck supple.  Skin:    General: Skin is warm and dry.     Capillary Refill: Capillary refill takes less than 2 seconds.  Neurological:     Mental Status: She is alert.     Comments: MENTAL STATUS EXAM:    Orientation: Alert and oriented to person, place and time.  Memory: Cooperative, follows commands well.  Language: Speech is clear and language is normal.   CRANIAL NERVES:    CN 2 (Optic): Visual fields intact to confrontation.  CN 3,4,6 (EOM): Pupils equal and reactive to light. Full extraocular eye movement without nystagmus.  CN 5 (Trigeminal): Facial sensation is normal slightly decreased on the left, no weakness of masticatory  muscles.  CN 7 (Facial): No facial weakness or asymmetry.  CN 8 (Auditory): Auditory acuity grossly normal.  CN 9,10 (Glossophar): The uvula is midline, the palate elevates symmetrically.  CN 11 (spinal access): Normal sternocleidomastoid and trapezius strength.  CN 12 (Hypoglossal): The tongue is midline. No atrophy or fasciculations.Marland Kitchen   MOTOR:  Muscle Strength: 5/5RUE, 5/5LUE, 5/5RLE, 5/5LLE.   COORDINATION:   Intact finger-to-nose, slower on the left compared to right, no tremor, no pronator drift.   SENSATION:   Intact to light touch all four extremities.  GAIT: Gait normal without clear ataxia although pt states she has been falling to the left   Psychiatric:  Mood and Affect: Mood normal.     ED Results / Procedures / Treatments   Labs (all labs ordered are listed, but only abnormal results are displayed) Labs Reviewed  URINALYSIS, ROUTINE W REFLEX MICROSCOPIC - Abnormal; Notable for the following components:      Result Value   Color, Urine STRAW (*)    All other components within normal limits  LIPASE, BLOOD  COMPREHENSIVE METABOLIC PANEL  CBC  TSH  VITAMIN B12  VITAMIN B1  TROPONIN I (HIGH SENSITIVITY)    EKG EKG Interpretation  Date/Time:  Saturday May 22 2022 12:45:11 EDT Ventricular Rate:  54 PR Interval:  131 QRS Duration: 110 QT Interval:  452 QTC Calculation: 429 R Axis:   -13 Text Interpretation: Sinus bradycardia Left ventricular hypertrophy Inferior infarct, age indeterminate Lateral leads are also involved Reconfirmed by Ernie Avena ([id number]) on 05/22/2022 3:17:02 PM  Radiology MR BRAIN WO CONTRAST  Result Date: 05/22/2022 CLINICAL DATA:  Neuro deficit, acute, stroke suspected EXAM: MRI HEAD WITHOUT CONTRAST TECHNIQUE: Multiplanar, multiecho pulse sequences of the brain and surrounding structures were obtained without intravenous contrast. COMPARISON:  CT head from the same day.  MRI head December 25, 2019. FINDINGS: Brain: No acute  infarction, hemorrhage, hydrocephalus, extra-axial collection or mass lesion. Mild for age scattered T2/FLAIR hyperintensities in the white matter, compatible with chronic microvascular ischemic disease. Remote lacunar infarcts in the thalami. Vascular: Major arterial flow voids are maintained skull base. Skull and upper cervical spine: Normal marrow signal. Sinuses/Orbits: Clear sinuses.  No acute orbital findings. Other: No sizable mastoid effusions. IMPRESSION: 1. No acute abnormality. 2. Small remote thalamic lacunar infarcts. Electronically Signed   By: Feliberto Harts M.D.   On: 05/22/2022 14:48   CT HEAD WO CONTRAST ( )  Result Date: 05/22/2022 CLINICAL DATA:  Neuro deficit with acute stroke suspected EXAM: CT HEAD WITHOUT CONTRAST TECHNIQUE: Contiguous axial images were obtained from the base of the skull through the vertex without intravenous contrast. RADIATION DOSE REDUCTION: This exam was performed according to the departmental dose-optimization program which includes automated exposure control, adjustment of the mA and/or kV according to patient size and/or use of iterative reconstruction technique. COMPARISON:  12/25/2019 FINDINGS: Brain: No evidence of acute infarction, hemorrhage, hydrocephalus, extra-axial collection or mass lesion/mass effect. High-density at the obex is stable from 2021 head CT and enhancing by 12/25/2019 brain MRI, likely a vascular structure. Vascular: No hyperdense vessel or unexpected calcification. Skull: Normal. Negative for fracture or focal lesion. Sinuses/Orbits: Unremarkable IMPRESSION: No acute finding or change from 2021. Electronically Signed   By: Tiburcio Pea M.D.   On: 05/22/2022 12:18    Procedures Procedures    Medications Ordered in ED Medications  ondansetron (ZOFRAN) injection 4 mg (4 mg Intravenous Not Given 05/22/22 1157)  lactated ringers bolus 1,000 mL (0 mLs Intravenous Stopped 05/22/22 1342)  dicyclomine (BENTYL) capsule 10 mg (10 mg  Oral Given 05/22/22 1154)  LORazepam (ATIVAN) tablet 1 mg (1 mg Oral Given 05/22/22 1342)    ED Course/ Medical Decision Making/ A&P                             Medical Decision Making Amount and/or Complexity of Data Reviewed Labs: ordered. Radiology: ordered.  Risk Prescription drug management.    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  The patient states that she has been feeling fatigued with associated nausea and a diarrheal illness with multiple episodes of watery diarrhea for the last 2 to 3 days.  Yesterday around 10 AM she noticed a sensation of feeling off balance, feeling like she was going to fall to the left side.  She also endorsed some left facial numbness.  The symptoms have persisted since 10 AM yesterday.  She denies any facial droop, weakness or numbness in her arms or legs.  She has had intermittent abdominal cramping and discomfort (for months) and endorses some crampy discomfort in the right lower quadrant today that is not new or severe.  She is not vomiting.  She denies any dysuria or increased urinary frequency.  No fevers or chills.  No vaginal bleeding or vaginal discharge.  She feels mildly dehydrated in the setting of her diarrheal illness.  On arrival, the patient was afebrile, not tachycardic or tachypneic, BP 154/89, saturating 100% on room air.  Sinus rhythm noted on cardiac telemetry.  Physical exam significant for an abdomen that was soft, nontender, nondistended, no rebound or guarding.  Patient endorses acute on chronic abdominal discomfort with no focal area of tenderness on exam.  Reassuring abdominal exam, low concern for acute intra-abdominal abnormality such as appendicitis, cholecystitis, diverticulitis, small bowel obstruction,  pancreatitis, hernia, perforated bowel, perforated ulcer.  No genitourinary symptoms.  Patient presenting with concern for slightly decreased facial sensation on the left, some unsteady gait with falling to the left.  No head trauma or loss of consciousness.  Will obtain stroke workup.  Given the patient's weakness, lower concern for ACS.  Initial EKG revealed sinus bradycardia, ventricular rate 54, actopic atrial rhythm present, LVH present, no acute ischemic changes.  Laboratory evaluation significant for initial troponin 6, B12 normal, TSH normal, urinalysis without hematuria or evidence of UTI, CMP without significant electrolyte abnormality, normal renal and liver function, lipase normal, CBC without a leukocytosis or anemia.  Vitamin B1 level pending.  She was administered an LR bolus and Bentyl in the setting of her recent diarrheal illness, and generalized weakness and fatigue.   CT Head: IMPRESSION:  No acute finding or change from 2021.    MRI Brain: IMPRESSION:  1. No acute abnormality.  2. Small remote thalamic lacunar infarcts.   No evidence of acute infarct, old lacunar infarcts noted. Tolerating oral intake, reassuring labs and exam.  Will discharge on a course of Bentyl for abdominal cramping and Zofran for nausea.  Recommended continued outpatient rehydration.  Recommended outpatient PCP follow-up.    Final Clinical Impression(s) / ED Diagnoses Final diagnoses:  Diarrhea, unspecified type  Generalized weakness  Gait instability    Rx / DC Orders ED Discharge Orders          Ordered    dicyclomine (BENTYL) 20 MG tablet  2 times daily        05/22/22 1503    ondansetron (ZOFRAN-ODT) 4 MG disintegrating tablet  Every 8 hours PRN        05/22/22 1503              Ernie Avena, MD 05/22/22 1517

## 2022-05-23 NOTE — Progress Notes (Signed)
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 Graft to Insertion lesion is 95% stenosed.  Intervention  Prox RCA lesion Stent (Also treats lesions: Mid RCA-1, and Mid RCA-2) CATH VISTA GUIDE 6FR JR4 guide catheter was inserted. Lesion crossed with guidewire using a WIRE COUGAR XT STRL 190CM. Pre-stent angioplasty was performed using a BALLOON SAPPHIRE 2.5X15. A drug-eluting stent was successfully placed using a STENT RESOLUTE ONYX [id number]. Post-stent angioplasty was performed using a BALLOON SAPPHIRE [place] [id number]. Maximum pressure:  18 atm. Post-Intervention Lesion Assessment The intervention was successful. Pre-interventional TIMI flow is 3. Post-intervention TIMI flow is 3. No complications occurred at this lesion. There is a 0% residual stenosis post intervention.  Mid RCA-1 lesion Stent (Also treats lesions: Prox RCA, and Mid RCA-2) See details in Prox RCA lesion. Post-Intervention Lesion Assessment The intervention was successful. Pre-interventional TIMI flow is 3. Post-intervention TIMI flow is 3. No complications occurred at this lesion. There is a 0% residual stenosis post  intervention.  Mid RCA-2 lesion Stent (Also treats lesions: Prox RCA, and Mid RCA-1) See details in Prox RCA lesion. Post-Intervention Lesion Assessment The intervention was successful. Pre-interventional TIMI flow is 3. Post-intervention TIMI flow is 3. No complications occurred at this lesion. There is a 0% residual stenosis post intervention.   STRESS TESTS  NM MYOCAR MULTI W/SPECT W 01/31/2018  Narrative CLINICAL DATA:  57 year old female history of coronary disease with prior percutaneous angioplasty and stenting to the circumflex and RCA.  EXAM: MYOCARDIAL IMAGING WITH SPECT (REST AND PHARMACOLOGIC-STRESS)  GATED LEFT VENTRICULAR WALL MOTION STUDY  LEFT VENTRICULAR EJECTION FRACTION  TECHNIQUE: Standard myocardial SPECT imaging was performed after resting intravenous injection of 10 mCi Tc-50m tetrofosmin. Subsequently, intravenous infusion of Lexiscan was performed under the supervision of the Cardiology staff. At peak effect of the drug, 30 mCi Tc-75m tetrofosmin was injected intravenously and standard myocardial SPECT imaging was performed. Quantitative gated imaging was also performed to evaluate left ventricular wall motion, and estimate left ventricular ejection fraction.  COMPARISON:  None.  FINDINGS: EKG interpretation: No ST changes during pharmacological stress.  Perfusion: There is decreased radiotracer activity within the basilar segment of the lateral wall which improves from stress to rest. This is a moderate size defect of moderate to high severity. No additional decrease counts in the LEFT ventricle.  Wall Motion: Normal left ventricular wall motion. No left ventricular dilation.  Left Ventricular Ejection Fraction: 43 %  End diastolic volume 100 ml  End systolic volume 57 ml  IMPRESSION: 1. Concern for reversible ischemia in the basilar segment of the LEFT lateral wall. Moderate size defect of moderate to high severity.  2. Normal left  ventricular wall motion.  3. Left ventricular ejection fraction 43%  4. Non invasive risk stratification*: Intermediate  *2012 Appropriate Use Criteria for Coronary Revascularization Focused Update: J Am Coll Cardiol. 2012;59(9):857-881. http://content.dementiazones.com.aspx?articleid=1201161  These results will be called to the ordering clinician or representative by the Radiologist Assistant, and communication documented in the PACS or zVision Dashboard.   Electronically Signed By: Genevive Bi M.D. On: 01/31/2018 15:43   ECHOCARDIOGRAM  ECHOCARDIOGRAM COMPLETE 04/25/2019  Narrative ECHOCARDIOGRAM REPORT    Patient Name:   [name] Date of Exam: 04/25/2019 Medical Rec #:  [id number]    Height:       61.0 in Accession #:    [id number]   Weight:       181.9 lb Date of Birth:  December 13, 1965    BSA:  1.814 m Patient Age:    53 years     BP:           138/81 mmHg Patient Gender: F            HR:           67 bpm. Exam Location:  Inpatient  Procedure: 2D Echo  Indications:    NSTEMI I21.4  History:        Patient has prior history of Echocardiogram examinations, most recent 01/18/2019. CHF, CAD and Previous Myocardial Infarction, Prior CABG; Risk Factors:Hypertension, Dyslipidemia, Sleep Apnea and Former Smoker. Anemia. GERD. 04/25/19 two stents placed in the mid/distal LAD.  Sonographer:    Leta Jungling RDCS Referring Phys: 33 RHONDA G BARRETT  IMPRESSIONS   1. Left ventricular ejection fraction, by estimation, is 60 to 65%. The left ventricle has normal global function. Basal inferior hypokinesis. There is mild left ventricular hypertrophy. Left ventricular diastolic parameters were normal. 2. Right ventricular systolic function is normal. The right ventricular size is normal. There is normal pulmonary artery systolic pressure. 3. The mitral valve is normal in structure. No evidence of mitral valve regurgitation. 4. The aortic valve was not well  visualized. Aortic valve regurgitation is not visualized. No aortic stenosis is present.  FINDINGS Left Ventricle: Left ventricular ejection fraction, by estimation, is 60 to 65%. The left ventricle has normal function. The left ventricle demonstrates regional wall motion abnormalities. The left ventricular internal cavity size was normal in size. There is mild left ventricular hypertrophy. Left ventricular diastolic parameters were normal.  Right Ventricle: The right ventricular size is normal. No increase in right ventricular wall thickness. Right ventricular systolic function is normal. There is normal pulmonary artery systolic pressure. The tricuspid regurgitant velocity is 2.30 m/s, and with an assumed right atrial pressure of 3 mmHg, the estimated right ventricular systolic pressure is 24.2 mmHg.  Left Atrium: Left atrial size was normal in size.  Right Atrium: Right atrial size was normal in size.  Pericardium: Trivial pericardial effusion is present.  Mitral Valve: The mitral valve is normal in structure. No evidence of mitral valve regurgitation.  Tricuspid Valve: The tricuspid valve is normal in structure. Tricuspid valve regurgitation is trivial.  Aortic Valve: The aortic valve was not well visualized. Aortic valve regurgitation is not visualized. No aortic stenosis is present.  Pulmonic Valve: The pulmonic valve was not well visualized. Pulmonic valve regurgitation is not visualized.  Aorta: The aortic root and ascending aorta are structurally normal, with no evidence of dilitation.  IAS/Shunts: The interatrial septum was not well visualized.   LEFT VENTRICLE PLAX 2D LVIDd:         3.35 cm      Diastology LVIDs:         2.45 cm      LV e' lateral:   13.10 cm/s LV PW:         0.95 cm      LV E/e' lateral: 8.2 LV IVS:        1.15 cm      LV e' medial:    8.49 cm/s LVOT diam:     2.00 cm      LV E/e' medial:  12.7 LV SV:         59 LV SV Index:   32 LVOT Area:     3.14  cm  LV Volumes (MOD) LV vol d, MOD A2C: 109.0 ml LV vol d, MOD A4C: 116.0 ml LV vol s, MOD  A2C: 49.5 ml LV vol s, MOD A4C: 51.0 ml LV SV MOD A2C:     59.5 ml LV SV MOD A4C:     116.0 ml LV SV MOD BP:      63.3 ml  LEFT ATRIUM             Index       RIGHT ATRIUM           Index LA diam:        3.80 cm 2.09 cm/m  RA Area:     16.00 cm LA Vol (A2C):   44.9 ml 24.75 ml/m RA Volume:   40.20 ml  22.16 ml/m LA Vol (A4C):   44.0 ml 24.25 ml/m LA Biplane Vol: 46.4 ml 25.58 ml/m AORTIC VALVE LVOT Vmax:   102.00 cm/s LVOT Vmean:  78.100 cm/s LVOT VTI:    0.187 m  AORTA Ao Root diam: 2.40 cm Ao Asc diam:  2.60 cm  MITRAL VALVE                TRICUSPID VALVE MV Area (PHT): 4.49 cm     TR Peak grad:   21.2 mmHg MV Decel Time: 169 msec     TR Vmax:        230.00 cm/s MV E velocity: 108.00 cm/s MV A velocity: 88.80 cm/s   SHUNTS MV E/A ratio:  1.22         Systemic VTI:  0.19 m Systemic Diam: 2.00 cm  Epifanio Lesches MD Electronically signed by Epifanio Lesches MD Signature Date/Time: 04/25/2019/7:50:41 PM    Final   TEE  ECHO INTRAOPERATIVE TEE 01/18/2019  Narrative *INTRAOPERATIVE TRANSESOPHAGEAL REPORT *    Patient Name:   [name]   Date of Exam: 01/18/2019 Medical Rec #:  [id number]      Height:       61.0 in Accession #:    [id number]     Weight:       190.0 lb Date of Birth:  03/02/1965      BSA:          1.85 m Patient Age:    53 years       BP:           130/80 mmHg Patient Gender: F              HR:           80 bpm. Exam Location:  Anesthesiology  Transesophogeal exam was perform intraoperatively during surgical procedure. Patient was closely monitored under general anesthesia during the entirety of examination.  Indications:     Coronary artery disease Sonographer:     Tonia Ghent RDCS Performing Phys: [id number] Eliezer Lofts LIGHTFOOT Diagnosing Phys: Arta Bruce MD  Complications: No known complications during this procedure. POST-OP  IMPRESSIONS - Left Ventricle: The left ventricle is unchanged from pre-bypass. - Aorta: The aorta appears unchanged from pre-bypass. - Aortic Valve: The aortic valve appears unchanged from pre-bypass. - Mitral Valve: There is mild regurgitation.  PRE-OP FINDINGS Left Ventricle: The left ventricle has normal systolic function, with an ejection fraction of 55-60%. The cavity size was normal. There is concentric left ventricular hypertrophy.  Right Ventricle: The right ventricle has normal systolic function. The cavity was normal. There is no increase in right ventricular wall thickness.  Left Atrium: Left atrial size was normal in size. The left atrial appendage is well visualized and there is no evidence of thrombus present.  Right Atrium: Right atrial size  was normal in size. Right atrial pressure is estimated at 10 mmHg.  Interatrial Septum: No atrial level shunt detected by color flow Doppler.  Pericardium: There is no evidence of pericardial effusion.  Mitral Valve: The mitral valve is normal in structure. No thickening of the mitral valve leaflet. No calcification of the mitral valve leaflet. Mitral valve regurgitation is not visualized by color flow Doppler.  Tricuspid Valve: The tricuspid valve was normal in structure. Tricuspid valve regurgitation was not visualized by color flow Doppler.  Aortic Valve: The aortic valve is normal in structure. Aortic valve regurgitation was not visualized by color flow Doppler. There is no stenosis of the aortic valve.  Pulmonic Valve: The pulmonic valve was normal in structure. Pulmonic valve regurgitation is not visualized by color flow Doppler.    Arta Bruce MD Electronically signed by Arta Bruce MD Signature Date/Time: 01/18/2019/3:01:26 PM    Final            Assessment & Plan    1.  History of CAD s/p CABG: - s/p CABG times 05/2018 and recent DES and 2021 for subsequent NSTEMI -Today patient reports no chest pain or shortness  of breath. -She reported some questionable compliance with her Plavix 75 mg and is planning to contact our office when she gets home to verify medications. -She reports no angina but did have 1 isolated episode that required nitroglycerin.    2.  Hypertension: -HYPERTENSION CONTROL Vitals:   05/24/22 0852 05/24/22 0938  BP: (!) 154/82 (!) 152/74    The patient's blood pressure is elevated above target today.  In order to address the patient's elevated BP: Blood pressure will be monitored at home to determine if medication changes need to be made.; A current anti-hypertensive medication was adjusted today.     -We will increase carvedilol to 6.25 mg daily -Patient will monitor blood pressures and contact office with readings in 2 weeks.  -She will return in 1 month for follow-up for blood pressure.    3.  Hyperlipidemia: -Patient's last lipid check was over 3 years ago. -Lipids and LFTs today -Based on cholesterol level we will resume Crestor medication as well as Zetia   4.  Alcohol abuse: -During today's visit patient reports being 90 days sober with a sponsor. -She was congratulated for this feet and advised to continue her treatment at this time.  5.  History of chronic combined CHF: -2D echo completed 2021 with EF of 60 to 65% -Patient is euvolemic today and denies any complaints of shortness of breath. -We will repeat 2D echo  -Continue carvedilol 6.25 mg twice daily, Lasix 20 mg daily losartan 12.5 mg -We will plan to add SGLT2 inhibitor following results of upcoming 2D echo.  Disposition: Follow-up with None or APP in 1 months    Medication Adjustments/Labs and Tests Ordered: Current medicines are reviewed at length with the patient today.  Concerns regarding medicines are outlined above.   Signed, Napoleon Form, Leodis Rains, NP 05/24/2022, 10:03 AM [facility]  Note:  This document was prepared using Dragon voice recognition software and may  include unintentional dictation errors.

## 2022-05-24 ENCOUNTER — Ambulatory Visit: Payer: BLUE CROSS/BLUE SHIELD | Attending: Nurse Practitioner | Admitting: Nurse Practitioner

## 2022-05-24 ENCOUNTER — Encounter: Payer: Self-pay | Admitting: Nurse Practitioner

## 2022-05-24 VITALS — BP 152/74 | HR 73 | Ht 61.0 in | Wt 167.8 lb

## 2022-05-24 DIAGNOSIS — F1010 Alcohol abuse, uncomplicated: Secondary | ICD-10-CM

## 2022-05-24 DIAGNOSIS — I5042 Chronic combined systolic (congestive) and diastolic (congestive) heart failure: Secondary | ICD-10-CM

## 2022-05-24 DIAGNOSIS — I2510 Atherosclerotic heart disease of native coronary artery without angina pectoris: Secondary | ICD-10-CM | POA: Diagnosis not present

## 2022-05-24 DIAGNOSIS — I5040 Unspecified combined systolic (congestive) and diastolic (congestive) heart failure: Secondary | ICD-10-CM

## 2022-05-24 DIAGNOSIS — I5032 Chronic diastolic (congestive) heart failure: Secondary | ICD-10-CM

## 2022-05-24 DIAGNOSIS — E782 Mixed hyperlipidemia: Secondary | ICD-10-CM | POA: Diagnosis not present

## 2022-05-24 DIAGNOSIS — I10 Essential (primary) hypertension: Secondary | ICD-10-CM | POA: Diagnosis not present

## 2022-05-24 LAB — VITAMIN B1: Vitamin B1 (Thiamine): 119.7 nmol/L (ref 66.5–200.0)

## 2022-05-24 MED ORDER — CARVEDILOL 6.25 MG PO TABS: 6.2500 mg | ORAL_TABLET | Freq: Two times a day (BID) | ORAL | 1 refills | Status: DC

## 2022-05-24 NOTE — Patient Instructions (Addendum)
Medication Instructions:  INCREASE Coreg to 6.25mg  take 1 tablet twice a day  BRING ALL OF YOUR MEDICATIONS TO YOUR NEXT APPOINTMENT  *If you need a refill on your cardiac medications before your next appointment, please call your pharmacy*   Lab Work: NONE ORDERED If you have labs (blood work) drawn today and your tests are completely normal, you will receive your results only by: MyChart Message (if you have MyChart) OR A paper copy in the mail If you have any lab test that is abnormal or we need to change your treatment, we will call you to review the results.   Testing/Procedures: Your physician has requested that you have an echocardiogram. Echocardiography is a painless test that uses sound waves to create images of your heart. It provides your doctor with information about the size and shape of your heart and how well your heart's chambers and valves are working. This procedure takes approximately one hour. There are no restrictions for this procedure. Please do NOT wear cologne, perfume, aftershave, or lotions (deodorant is allowed). Please arrive 15 minutes prior to your appointment time.   Follow-Up: At [facility], you and your health needs are our priority.  As part of our continuing mission to provide you with exceptional heart care, we have created designated Provider Care Teams.  These Care Teams include your primary Cardiologist (physician) and Advanced Practice Providers (APPs -  Physician Assistants and Nurse Practitioners) who all work together to provide you with the care you need, when you need it.  We recommend signing up for the patient portal called "MyChart".  Sign up information is provided on this After Visit Summary.  MyChart is used to connect with patients for Virtual Visits (Telemedicine).  Patients are able to view lab/test results, encounter notes, upcoming appointments, etc.  Non-urgent messages can be sent to your provider as well.   To learn more  about what you can do with MyChart, go to [email or web address].    Your next appointment:   1 month(s)  Provider:   Robin Searing, NP      Then, Dr Shari Prows, Dr Izora Ribas or Dr Lynnette Caffey  will plan to see you again in 6 month(s).  (Schedule with who ever have first available)  Other Instructions  Please monitor blood pressures and keep a log of your readings.  BRING READINGS TO YOUR NEXT APPOINTMENT.  Make sure to check 2 hours after your medications.   AVOID these things for 30 minutes before checking your blood pressure: No Drinking caffeine. No Drinking alcohol. No Eating. No Smoking. No Exercising.  Five minutes before checking your blood pressure: Pee. Sit in a dining chair. Avoid sitting in a soft couch or armchair. Be quiet. Do not talk.

## 2022-05-27 ENCOUNTER — Other Ambulatory Visit: Payer: Self-pay

## 2022-05-27 ENCOUNTER — Telehealth: Payer: Self-pay

## 2022-05-27 MED ORDER — ROSUVASTATIN CALCIUM 40 MG PO TABS: 40.0000 mg | ORAL_TABLET | Freq: Every day | ORAL | 3 refills | Status: DC

## 2022-05-27 MED ORDER — AMLODIPINE BESYLATE 10 MG PO TABS: 10.0000 mg | ORAL_TABLET | Freq: Every day | ORAL | 3 refills | Status: DC

## 2022-05-27 MED ORDER — LOSARTAN POTASSIUM 25 MG PO TABS: 12.5000 mg | ORAL_TABLET | Freq: Every day | ORAL | 3 refills | Status: DC

## 2022-05-27 MED ORDER — FUROSEMIDE 20 MG PO TABS: 20.0000 mg | ORAL_TABLET | Freq: Every day | ORAL | 3 refills | Status: DC

## 2022-05-27 MED ORDER — CLOPIDOGREL BISULFATE 75 MG PO TABS: 75.0000 mg | ORAL_TABLET | Freq: Every day | ORAL | 3 refills | Status: DC

## 2022-05-27 MED ORDER — CARVEDILOL 6.25 MG PO TABS: 6.2500 mg | ORAL_TABLET | Freq: Two times a day (BID) | ORAL | 3 refills | Status: DC

## 2022-05-27 MED ORDER — NITROGLYCERIN 0.4 MG SL SUBL: 0.4000 mg | SUBLINGUAL_TABLET | SUBLINGUAL | 3 refills | Status: AC | PRN

## 2022-05-27 NOTE — Telephone Encounter (Addendum)
Patient called the office requesting refills on her medication.   Patient requested I give her a call back to discuss her blood pressure medications.   Left message for the patient to contact the office.

## 2022-06-21 ENCOUNTER — Ambulatory Visit (HOSPITAL_COMMUNITY): Payer: BLUE CROSS/BLUE SHIELD | Attending: Nurse Practitioner

## 2022-06-21 ENCOUNTER — Encounter (HOSPITAL_COMMUNITY): Payer: Self-pay | Admitting: Nurse Practitioner

## 2022-08-02 ENCOUNTER — Ambulatory Visit (HOSPITAL_COMMUNITY): Payer: BLUE CROSS/BLUE SHIELD

## 2022-08-14 ENCOUNTER — Other Ambulatory Visit: Payer: Self-pay

## 2022-08-14 ENCOUNTER — Emergency Department (HOSPITAL_COMMUNITY)
Admission: EM | Admit: 2022-08-14 | Discharge: 2022-08-14 | Disposition: A | Discharge status: Home / Self Care | Payer: BLUE CROSS/BLUE SHIELD | Attending: Emergency Medicine | Admitting: Emergency Medicine

## 2022-08-14 DIAGNOSIS — Z7982 Long term (current) use of aspirin: Secondary | ICD-10-CM | POA: Diagnosis not present

## 2022-08-14 DIAGNOSIS — Z91040 Latex allergy status: Secondary | ICD-10-CM | POA: Diagnosis not present

## 2022-08-14 DIAGNOSIS — Z760 Encounter for issue of repeat prescription: Secondary | ICD-10-CM | POA: Diagnosis present

## 2022-08-14 DIAGNOSIS — F419 Anxiety disorder, unspecified: Secondary | ICD-10-CM | POA: Diagnosis not present

## 2022-08-14 MED ORDER — DIVALPROEX SODIUM ER 500 MG PO TB24: 500.0000 mg | ORAL_TABLET | Freq: Three times a day (TID) | ORAL | 0 refills | Status: DC

## 2022-08-14 MED ORDER — ESCITALOPRAM OXALATE 20 MG PO TABS: 20.0000 mg | ORAL_TABLET | Freq: Every day | ORAL | 0 refills | Status: DC

## 2022-08-14 NOTE — ED Provider Triage Note (Signed)
Emergency Medicine Provider Triage Evaluation Note  [name] , a 57 y.o. female  was evaluated in triage.  Pt complains of medication refill.  States she has been out of her Lexapro for a week.  She only has 4 pills of Depakote left.  Her primary care provider is on vacation and she does not have an appointment until the end of the month.  She feels more sad and anxious than usual.  She denies any suicidal or homicidal ideations.  No chest pain, shortness of breath, fevers or chills.  She has not taken her antihypertensives today.  Review of Systems  Positive: As above Negative: As above  Physical Exam  BP (!) 185/96   Pulse 67   Temp 98.3 F (36.8 C) (Oral)   Resp 16   Ht 5\' 1"  (1.549 m)   Wt 76.2 kg   LMP  (LMP Unknown)   SpO2 99%   BMI 31.74 kg/m  Gen:   Awake, no distress   Resp:  Normal effort  MSK:   Moves extremities without difficulty  Other:    Medical Decision Making  Medically screening exam initiated at 1:39 PM.  Appropriate orders placed.  Kalene Abdo was informed that the remainder of the evaluation will be completed by another provider, this initial triage assessment does not replace that evaluation, and the importance of remaining in the ED until their evaluation is complete.     Michelle Piper, [place] 08/14/22 1340

## 2022-08-14 NOTE — ED Triage Notes (Signed)
Pt. Stated, I need a refill my medication, she is on vacation and I dont see her til 1st week in August. Ive used more than normal because of my job obligations. Im feeling a lot anxious.

## 2022-08-14 NOTE — ED Provider Notes (Signed)
[place] EMERGENCY DEPARTMENT AT [facility] Provider Note   CSN: [id number] Arrival date & time: 08/14/22  1230     History  Chief Complaint  Patient presents with   Medication Refill   Anxiety    [name] is a 57 y.o. female with past medical history significant for bipolar, depression, anxiety who presents with concern for need for medication refill.  Patient reports that her PCP is on vacation, does not return to the first week of August.  Patient reports that she is feeling quite anxious.  She requests refills of her Depakote and Lexapro.  She denies any SI, HI, AVH.  She is able to show me her medication bottles, she does still have some Depakote left but her Lexapro is empty   Medication Refill Anxiety       Home Medications Prior to Admission medications   Medication Sig Start Date End Date Taking? Authorizing Provider  amLODipine (NORVASC) 10 MG tablet Take 1 tablet (10 mg total) by mouth daily. 05/27/22   Gaston Islam., NP  aspirin 81 MG chewable tablet Chew 81 mg by mouth daily.    [provider]  busPIRone (BUSPAR) 10 MG tablet Take 1 tablet (10 mg total) by mouth 3 (three) times daily. 01/25/19   Ardelle Balls, PA-C  carvedilol (COREG) 6.25 MG tablet Take 1 tablet (6.25 mg total) by mouth 2 (two) times daily. 05/27/22   Gaston Islam., NP  clopidogrel (PLAVIX) 75 MG tablet Take 1 tablet (75 mg total) by mouth daily. 05/27/22   Gaston Islam., NP  divalproex (DEPAKOTE ER) 500 MG 24 hr tablet Take 1 tablet (500 mg total) by mouth 3 (three) times daily. 08/14/22   Odel Schmid H, PA-C  escitalopram (LEXAPRO) 20 MG tablet Take 1 tablet (20 mg total) by mouth daily. 08/14/22 09/13/22  Massimiliano Rohleder H, PA-C  furosemide (LASIX) 20 MG tablet Take 1 tablet (20 mg total) by mouth daily. 05/27/22 05/22/23  Gaston Islam., NP  losartan (COZAAR) 25 MG tablet Take 0.5 tablets (12.5 mg total) by mouth daily. 05/27/22   Gaston Islam., NP  nitroGLYCERIN (NITROSTAT) 0.4 MG SL tablet Place 1 tablet (0.4 mg total) under the tongue every 5 (five) minutes x 3 doses as needed for chest pain. 05/27/22   Gaston Islam., NP  rosuvastatin (CRESTOR) 40 MG tablet Take 1 tablet (40 mg total) by mouth daily. 05/27/22   Gaston Islam., NP      Allergies    Lidocaine, Codeine, Percocet [oxycodone-acetaminophen], Vicodin [hydrocodone-acetaminophen], 5-alpha reductase inhibitors, Atorvastatin, and Latex    Review of Systems   Review of Systems  All other systems reviewed and are negative.   Physical Exam Updated Vital Signs BP (!) 175/84 (BP Location: Right Arm)   Pulse 68   Temp 98.3 F (36.8 C) (Oral)   Resp 16   Ht 5\' 1"  (1.549 m)   Wt 76.2 kg   LMP  (LMP Unknown)   SpO2 100%   BMI 31.74 kg/m  Physical Exam Vitals and nursing note reviewed.  Constitutional:      General: She is not in acute distress.    Appearance: Normal appearance.  HENT:     Head: Normocephalic and atraumatic.  Eyes:     General:        Right eye: No discharge.        Left eye: No discharge.  Cardiovascular:  Rate and Rhythm: Normal rate and regular rhythm.     Heart sounds: No murmur heard.    No friction rub. No gallop.  Pulmonary:     Effort: Pulmonary effort is normal.     Breath sounds: Normal breath sounds.  Abdominal:     General: Bowel sounds are normal.     Palpations: Abdomen is soft.  Skin:    General: Skin is warm and dry.     Capillary Refill: Capillary refill takes less than 2 seconds.  Neurological:     Mental Status: She is alert and oriented to person, place, and time.  Psychiatric:        Mood and Affect: Mood normal.        Behavior: Behavior normal.     ED Results / Procedures / Treatments   Labs (all labs ordered are listed, but only abnormal results are displayed) Labs Reviewed - No data to display  EKG None  Radiology No results found.  Procedures Procedures    Medications Ordered  in ED Medications - No data to display  ED Course/ Medical Decision Making/ A&P                             Medical Decision Making  This an overall well-appearing 57 year old female who presents with concern for medication refill.  She has history of bipolar, anxiety, and is requesting a refill of her Depakote, Lexapro.  She does have some elevated blood pressure, BP 175/84, otherwise vital signs stable.  She seems to be responding questions appropriately.  She denies any SI, HI, AVH.  She is able to show me her prescription bottles, and I think it is reasonable to refill these prescriptions at this time as she has a history of being prescribed of these medications.  Medications refilled and patient discharged in stable condition. Final Clinical Impression(s) / ED Diagnoses Final diagnoses:  Medication refill    Rx / DC Orders ED Discharge Orders          Ordered    divalproex (DEPAKOTE ER) 500 MG 24 hr tablet  3 times daily        08/14/22 1532    escitalopram (LEXAPRO) 20 MG tablet  Daily        08/14/22 1532              Olene Floss, PA-C 08/14/22 1539    Wynetta Fines, MD 08/15/22 [id number]

## 2022-08-30 ENCOUNTER — Ambulatory Visit (HOSPITAL_COMMUNITY): Payer: BLUE CROSS/BLUE SHIELD | Attending: Nurse Practitioner

## 2022-11-06 ENCOUNTER — Other Ambulatory Visit: Payer: Self-pay

## 2022-11-06 ENCOUNTER — Emergency Department (HOSPITAL_COMMUNITY): Payer: BLUE CROSS/BLUE SHIELD

## 2022-11-06 ENCOUNTER — Emergency Department (HOSPITAL_COMMUNITY)
Admission: EM | Admit: 2022-11-06 | Discharge: 2022-11-06 | Disposition: A | Discharge status: Home / Self Care | Payer: BLUE CROSS/BLUE SHIELD | Attending: Emergency Medicine | Admitting: Emergency Medicine

## 2022-11-06 ENCOUNTER — Encounter (HOSPITAL_COMMUNITY): Payer: Self-pay

## 2022-11-06 DIAGNOSIS — Z91040 Latex allergy status: Secondary | ICD-10-CM | POA: Diagnosis not present

## 2022-11-06 DIAGNOSIS — I2510 Atherosclerotic heart disease of native coronary artery without angina pectoris: Secondary | ICD-10-CM

## 2022-11-06 DIAGNOSIS — Z955 Presence of coronary angioplasty implant and graft: Secondary | ICD-10-CM | POA: Diagnosis not present

## 2022-11-06 DIAGNOSIS — I25119 Atherosclerotic heart disease of native coronary artery with unspecified angina pectoris: Secondary | ICD-10-CM | POA: Insufficient documentation

## 2022-11-06 DIAGNOSIS — I209 Angina pectoris, unspecified: Secondary | ICD-10-CM

## 2022-11-06 DIAGNOSIS — Z7982 Long term (current) use of aspirin: Secondary | ICD-10-CM | POA: Diagnosis not present

## 2022-11-06 DIAGNOSIS — R079 Chest pain, unspecified: Secondary | ICD-10-CM | POA: Diagnosis present

## 2022-11-06 LAB — CBC
HCT: 40.9 % (ref 36.0–46.0)
Hemoglobin: 13.3 g/dL (ref 12.0–15.0)
MCH: 28.5 pg (ref 26.0–34.0)
MCHC: 32.5 g/dL (ref 30.0–36.0)
MCV: 87.8 fL (ref 80.0–100.0)
Platelets: 295 10*3/uL (ref 150–400)
RBC: 4.66 MIL/uL (ref 3.87–5.11)
RDW: 13.3 % (ref 11.5–15.5)
WBC: 5.7 10*3/uL (ref 4.0–10.5)
nRBC: 0 % (ref 0.0–0.2)

## 2022-11-06 LAB — TROPONIN I (HIGH SENSITIVITY)
Troponin I (High Sensitivity): 5 ng/L (ref <18)
Troponin I (High Sensitivity): 5 ng/L (ref <18)

## 2022-11-06 LAB — BASIC METABOLIC PANEL
Anion gap: 10 (ref 5–15)
BUN: 14 mg/dL (ref 6–20)
CO2: 24 mmol/L (ref 22–32)
Calcium: 9.4 mg/dL (ref 8.9–10.3)
Chloride: 103 mmol/L (ref 98–111)
Creatinine, Ser: 0.77 mg/dL (ref 0.44–1.00)
GFR, Estimated: >60 mL/min (ref >60)
Glucose, Bld: 111 mg/dL — ABNORMAL HIGH (ref 70–99)
Potassium: 3.9 mmol/L (ref 3.5–5.1)
Sodium: 137 mmol/L (ref 135–145)

## 2022-11-06 NOTE — ED Notes (Signed)
PT c/o of intermittent chest pain rated at 7 in the middle of her chest and a feeling of having to burp.

## 2022-11-06 NOTE — ED Triage Notes (Signed)
Pt came in via POV d/t central CP that feels like pressure for the past 2 days, she does report it radiating into her Lt side of jaw. A/Ox4, rates pain 5/10 while in triage.

## 2022-11-06 NOTE — ED Notes (Signed)
PT told nursing staff that she was leaving because she had something to do. I did not witness her leaving

## 2022-11-06 NOTE — ED Provider Notes (Signed)
[place] EMERGENCY DEPARTMENT AT [facility] Provider Note   CSN: [id number] Arrival date & time: 11/06/22  1447     History  Chief Complaint  Patient presents with   Chest Pain    [name] is a 57 y.o. female.  HPI    57 y/o female comes in with chief complaint of chest pain. Patient has history of CAD status post multiple PCI and CABG.  She had a PCI placed after previous CABG.  She comes in with chief complaint of chest pain that is central for the last 2 days.  Pain is coming and going and would last just for few seconds.  She has also felt some discomfort radiating to the front of her jaw.  Symptoms are not constant.  Symptoms are provoked with any exertion.  Symptoms are different than her prior ACS.  However given multiple PCI, she decided to come to the ER.  Previously, she states that her chest pain would be pressure type pain, fairly constant, that radiates to the left upper extremity.  Currently the pain is not as severe and it will last just for few seconds.  Patient denies any associated nausea, diaphoresis, shortness of breath.  Additionally, patient feels like she is bloated and occasionally burping.  She did not have those symptoms with her CAD.  Home Medications Prior to Admission medications   Medication Sig Start Date End Date Taking? Authorizing Provider  amLODipine (NORVASC) 10 MG tablet Take 1 tablet (10 mg total) by mouth daily. 05/27/22   Gaston Islam., NP  aspirin 81 MG chewable tablet Chew 81 mg by mouth daily.    [provider]  busPIRone (BUSPAR) 10 MG tablet Take 1 tablet (10 mg total) by mouth 3 (three) times daily. 01/25/19   Ardelle Balls, PA-C  carvedilol (COREG) 6.25 MG tablet Take 1 tablet (6.25 mg total) by mouth 2 (two) times daily. 05/27/22   Gaston Islam., NP  clopidogrel (PLAVIX) 75 MG tablet Take 1 tablet (75 mg total) by mouth daily. 05/27/22   Gaston Islam., NP  divalproex (DEPAKOTE ER) 500 MG 24  hr tablet Take 1 tablet (500 mg total) by mouth 3 (three) times daily. 08/14/22   Prosperi, Christian H, PA-C  escitalopram (LEXAPRO) 20 MG tablet Take 1 tablet (20 mg total) by mouth daily. 08/14/22 09/13/22  Prosperi, Christian H, PA-C  furosemide (LASIX) 20 MG tablet Take 1 tablet (20 mg total) by mouth daily. 05/27/22 05/22/23  Gaston Islam., NP  losartan (COZAAR) 25 MG tablet Take 0.5 tablets (12.5 mg total) by mouth daily. 05/27/22   Gaston Islam., NP  nitroGLYCERIN (NITROSTAT) 0.4 MG SL tablet Place 1 tablet (0.4 mg total) under the tongue every 5 (five) minutes x 3 doses as needed for chest pain. 05/27/22   Gaston Islam., NP  rosuvastatin (CRESTOR) 40 MG tablet Take 1 tablet (40 mg total) by mouth daily. 05/27/22   Gaston Islam., NP      Allergies    Lidocaine, Codeine, Percocet [oxycodone-acetaminophen], Vicodin [hydrocodone-acetaminophen], 5-alpha reductase inhibitors, Atorvastatin, and Latex    Review of Systems   Review of Systems  All other systems reviewed and are negative.   Physical Exam Updated Vital Signs BP (!) 189/82   Pulse (!) 50   Temp 97.9 F (36.6 C) (Oral)   Resp 16   Ht 5\' 1"  (1.549 m)   Wt 76.2 kg   LMP  (  LMP Unknown)   SpO2 100%   BMI 31.74 kg/m  Physical Exam Vitals and nursing note reviewed.  Constitutional:      Appearance: She is well-developed.  HENT:     Head: Atraumatic.  Cardiovascular:     Rate and Rhythm: Normal rate.  Pulmonary:     Effort: Pulmonary effort is normal.     Breath sounds: Normal breath sounds.  Musculoskeletal:     Cervical back: Normal range of motion and neck supple.  Skin:    General: Skin is warm and dry.  Neurological:     Mental Status: She is alert and oriented to person, place, and time.     ED Results / Procedures / Treatments   Labs (all labs ordered are listed, but only abnormal results are displayed) Labs Reviewed  BASIC METABOLIC PANEL - Abnormal; Notable for the following components:       Result Value   Glucose, Bld 111 (*)    All other components within normal limits  CBC  TROPONIN I (HIGH SENSITIVITY)  TROPONIN I (HIGH SENSITIVITY)    EKG EKG Interpretation Date/Time:  Saturday November 06 2022 14:57:18 EDT Ventricular Rate:  68 PR Interval:  138 QRS Duration:  102 QT Interval:  400 QTC Calculation: 425 R Axis:   16  Text Interpretation: Normal sinus rhythm Possible Left atrial enlargement Minimal voltage criteria for LVH, may be normal variant ( R in aVL ) Inferior infarct , age undetermined Anterior infarct , age undetermined Abnormal ECG When compared with ECG of 22-May-2022 12:45, PREVIOUS ECG IS PRESENT Confirmed by Derwood Kaplan [phone]) on 11/06/2022 5:28:30 PM  Radiology DG Chest 2 View  Result Date: 11/06/2022 CLINICAL DATA:  Chest pain EXAM: CHEST - 2 VIEW COMPARISON:  01/26/2022 FINDINGS: The lungs are clear without focal pneumonia, edema, pneumothorax or pleural effusion. The cardiopericardial silhouette is within normal limits for size. Status post CABG. No acute bony abnormality. IMPRESSION: No active cardiopulmonary disease. Electronically Signed   By: Kennith Center M.D.   On: 11/06/2022 15:55    Procedures Procedures    Medications Ordered in ED Medications - No data to display  ED Course/ Medical Decision Making/ A&P                                 Medical Decision Making Amount and/or Complexity of Data Reviewed Labs: ordered. Radiology: ordered.    The differential diagnosis includes  This patient presents to the ED with chief complaint(s) of intermittent episodes of chest pain over the last 2 days with typical and atypical features with pertinent past medical history of CAD status post CABG and multiple PCI.The complaint involves an extensive differential diagnosis and also carries with it a high risk of complications and morbidity.    The differential diagnosis considered for this patient includes  ACS syndrome Aortic  dissection Myocarditis PE Pneumothorax Musculoskeletal pain PUD / Gastritis / Esophagitis Esophageal spasm  Initial EKG is reassuring. The initial plan is to get basic labs including delta troponin.   Additional history obtained: Records reviewed  previous discharge summary for, previous cardiac cath from 2021 where she required PCI.  Also reviewed patient's recent cardiology visit from 2023 and ED visit from earlier this year where she had delta troponin that were normal.  Independent labs interpretation:  The following labs were independently interpreted: Patient's high sensitive troponins are normal.  Independent visualization and interpretation of imaging: - I  independently visualized the following imaging with scope of interpretation limited to determining acute life threatening conditions related to emergency care: X-ray of the chest, which revealed no evidence of pneumothorax  Treatment and Reassessment: Patient reassessed.  She indicates that she continues to have intermittent episodes of chest pain.  She has taken aspirin already at home.   Consultation: - Consulted or discussed management/test interpretation with external professional: I consulted cardiology given patient's moderate to high risk features along with typical and atypical component to the chest pain.  Unfortunately, prior to cardiology calling me back, patient indicated that she had an emergency and she had to leave. She feels well right now.  Strict ER return precautions have been discussed, and patient is agreeing with the plan and is comfortable with the workup done and the recommendations from the ER.  I will still put in cardiology outpatient referral. Admission to the hospital was considered, was pending cardiology recommendation.  Final Clinical Impression(s) / ED Diagnoses Final diagnoses:  Angina pectoris (HCC)  Coronary artery disease involving native coronary artery of native heart without  angina pectoris    Rx / DC Orders ED Discharge Orders          Ordered    Ambulatory referral to Cardiology       Comments: If you have not heard from the Cardiology office within the next 72 hours please call [phone].   11/06/22 2031              Derwood Kaplan, MD 11/06/22 2128

## 2022-12-06 ENCOUNTER — Ambulatory Visit (HOSPITAL_COMMUNITY): Payer: BLUE CROSS/BLUE SHIELD
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
## 2022-12-13 ENCOUNTER — Ambulatory Visit: Payer: BLUE CROSS/BLUE SHIELD | Attending: Nurse Practitioner | Admitting: Nurse Practitioner

## 2022-12-13 DIAGNOSIS — I5040 Unspecified combined systolic (congestive) and diastolic (congestive) heart failure: Secondary | ICD-10-CM

## 2022-12-13 DIAGNOSIS — F1010 Alcohol abuse, uncomplicated: Secondary | ICD-10-CM

## 2022-12-13 DIAGNOSIS — I2510 Atherosclerotic heart disease of native coronary artery without angina pectoris: Secondary | ICD-10-CM

## 2022-12-13 DIAGNOSIS — E782 Mixed hyperlipidemia: Secondary | ICD-10-CM

## 2022-12-13 DIAGNOSIS — I10 Essential (primary) hypertension: Secondary | ICD-10-CM

## 2023-01-10 ENCOUNTER — Other Ambulatory Visit: Payer: Self-pay

## 2023-01-10 ENCOUNTER — Encounter (HOSPITAL_COMMUNITY): Payer: Self-pay

## 2023-01-10 ENCOUNTER — Emergency Department (HOSPITAL_COMMUNITY)
Admission: EM | Admit: 2023-01-10 | Discharge: 2023-01-10 | Disposition: A | Discharge status: Home / Self Care | Payer: BLUE CROSS/BLUE SHIELD | Attending: Emergency Medicine | Admitting: Emergency Medicine

## 2023-01-10 DIAGNOSIS — I10 Essential (primary) hypertension: Secondary | ICD-10-CM | POA: Insufficient documentation

## 2023-01-10 DIAGNOSIS — Z7982 Long term (current) use of aspirin: Secondary | ICD-10-CM | POA: Insufficient documentation

## 2023-01-10 DIAGNOSIS — I2510 Atherosclerotic heart disease of native coronary artery without angina pectoris: Secondary | ICD-10-CM | POA: Insufficient documentation

## 2023-01-10 DIAGNOSIS — Z7901 Long term (current) use of anticoagulants: Secondary | ICD-10-CM | POA: Insufficient documentation

## 2023-01-10 DIAGNOSIS — Z91199 Patient's noncompliance with other medical treatment and regimen due to unspecified reason: Secondary | ICD-10-CM | POA: Insufficient documentation

## 2023-01-10 DIAGNOSIS — Z79899 Other long term (current) drug therapy: Secondary | ICD-10-CM | POA: Diagnosis not present

## 2023-01-10 DIAGNOSIS — Z91040 Latex allergy status: Secondary | ICD-10-CM | POA: Diagnosis not present

## 2023-01-10 DIAGNOSIS — Z87442 Personal history of urinary calculi: Secondary | ICD-10-CM | POA: Diagnosis not present

## 2023-01-10 DIAGNOSIS — Z760 Encounter for issue of repeat prescription: Secondary | ICD-10-CM | POA: Diagnosis present

## 2023-01-10 MED ORDER — DIVALPROEX SODIUM ER 500 MG PO TB24: 500.0000 mg | ORAL_TABLET | Freq: Three times a day (TID) | ORAL | 0 refills | Status: DC

## 2023-01-10 MED ORDER — ESCITALOPRAM OXALATE 20 MG PO TABS: 20.0000 mg | ORAL_TABLET | Freq: Every day | ORAL | 0 refills | Status: DC

## 2023-01-10 NOTE — ED Triage Notes (Addendum)
Pt coming in today asking for a medication refills for her blood pressure and antidepressants. Denies any S/S at this time. PCP unable to see pt until next month.

## 2023-01-10 NOTE — ED Provider Notes (Signed)
[place] EMERGENCY DEPARTMENT AT [facility] Provider Note   CSN: [id number] Arrival date & time: 01/10/23  1051     History  Chief Complaint  Patient presents with   Medication Refill    [name] is a 57 y.o. female.  Pt is a 57 yo female with pmhx significant for htn, depression, cad, bipolar d/o kidney stones, and arthritis.  Pt has been out of her depakote and lexapro and has also not been taking her other meds.  She said when she gets depressed, she does not take care of herself.   She is here wanting a refill of her depression meds.  She has an appt with her pcp next month.  Pt has all her other meds, so does not need a refill.  She denies any cp or sob.  She does have some tingling in 2 of her fingers on the left side.  No weakness.       Home Medications Prior to Admission medications   Medication Sig Start Date End Date Taking? Authorizing Provider  amLODipine (NORVASC) 10 MG tablet Take 1 tablet (10 mg total) by mouth daily. 05/27/22   Gaston Islam., NP  aspirin 81 MG chewable tablet Chew 81 mg by mouth daily.    [provider]  busPIRone (BUSPAR) 10 MG tablet Take 1 tablet (10 mg total) by mouth 3 (three) times daily. 01/25/19   Ardelle Balls, PA-C  carvedilol (COREG) 6.25 MG tablet Take 1 tablet (6.25 mg total) by mouth 2 (two) times daily. 05/27/22   Gaston Islam., NP  clopidogrel (PLAVIX) 75 MG tablet Take 1 tablet (75 mg total) by mouth daily. 05/27/22   Gaston Islam., NP  divalproex (DEPAKOTE ER) 500 MG 24 hr tablet Take 1 tablet (500 mg total) by mouth 3 (three) times daily. 01/10/23   Jacalyn Lefevre, MD  escitalopram (LEXAPRO) 20 MG tablet Take 1 tablet (20 mg total) by mouth daily. 01/10/23 02/09/23  Jacalyn Lefevre, MD  furosemide (LASIX) 20 MG tablet Take 1 tablet (20 mg total) by mouth daily. 05/27/22 05/22/23  Gaston Islam., NP  losartan (COZAAR) 25 MG tablet Take 0.5 tablets (12.5 mg total) by mouth daily.  05/27/22   Gaston Islam., NP  nitroGLYCERIN (NITROSTAT) 0.4 MG SL tablet Place 1 tablet (0.4 mg total) under the tongue every 5 (five) minutes x 3 doses as needed for chest pain. 05/27/22   Gaston Islam., NP  rosuvastatin (CRESTOR) 40 MG tablet Take 1 tablet (40 mg total) by mouth daily. 05/27/22   Gaston Islam., NP      Allergies    Lidocaine, Codeine, Percocet [oxycodone-acetaminophen], Vicodin [hydrocodone-acetaminophen], 5-alpha reductase inhibitors, Atorvastatin, and Latex    Review of Systems   Review of Systems  All other systems reviewed and are negative.   Physical Exam Updated Vital Signs BP (!) 189/109 (BP Location: Right Arm) Comment: informed the nurse of b/p  Pulse 74   Temp 98.2 F (36.8 C) (Oral)   Resp 18   Ht 5\' 1"  (1.549 m)   Wt 74.8 kg   LMP  (LMP Unknown)   SpO2 100%   BMI 31.18 kg/m  Physical Exam Vitals and nursing note reviewed.  Constitutional:      Appearance: Normal appearance.  HENT:     Head: Normocephalic and atraumatic.     Right Ear: External ear normal.     Left Ear: External ear normal.  Nose: Nose normal.     Mouth/Throat:     Mouth: Mucous membranes are moist.     Pharynx: Oropharynx is clear.  Eyes:     Extraocular Movements: Extraocular movements intact.     Conjunctiva/sclera: Conjunctivae normal.     Pupils: Pupils are equal, round, and reactive to light.  Cardiovascular:     Rate and Rhythm: Normal rate.     Pulses: Normal pulses.     Heart sounds: Normal heart sounds.  Pulmonary:     Effort: Pulmonary effort is normal.  Abdominal:     General: Abdomen is flat. Bowel sounds are normal.     Palpations: Abdomen is soft.  Musculoskeletal:        General: Normal range of motion.     Cervical back: Normal range of motion and neck supple.  Skin:    General: Skin is warm.     Capillary Refill: Capillary refill takes less than 2 seconds.  Neurological:     General: No focal deficit present.     Mental Status:  She is alert and oriented to person, place, and time.  Psychiatric:        Mood and Affect: Mood normal.        Behavior: Behavior normal.        Thought Content: Thought content normal.        Judgment: Judgment normal.     ED Results / Procedures / Treatments   Labs (all labs ordered are listed, but only abnormal results are displayed) Labs Reviewed - No data to display  EKG None  Radiology No results found.  Procedures Procedures    Medications Ordered in ED Medications - No data to display  ED Course/ Medical Decision Making/ A&P                                 Medical Decision Making Risk Prescription drug management.   Pt is given a refill of her meds.  We talked about appropriate foods to eat when one has htn and cad. Tingling in fingers is likely from cervical radiculopathy.  No other sx c/w cva.   She is to talk with pcp about that.  She may need a dietary consult by pcp as she has no idea what food she should eat.  She is making an appt with her cardiologist now.  She is encouraged to take all her meds as directed.  She is stable for d/c.  Return if worse.         Final Clinical Impression(s) / ED Diagnoses Final diagnoses:  Medication refill  Hypertension, unspecified type  Medically noncompliant    Rx / DC Orders ED Discharge Orders          Ordered    divalproex (DEPAKOTE ER) 500 MG 24 hr tablet  3 times daily        01/10/23 1107    escitalopram (LEXAPRO) 20 MG tablet  Daily        01/10/23 1107              Jacalyn Lefevre, MD 01/10/23 1117

## 2023-01-10 NOTE — Discharge Instructions (Addendum)
Make sure you take your medications as directed by your doctor.

## 2023-01-20 ENCOUNTER — Telehealth: Payer: Self-pay | Admitting: Nurse Practitioner

## 2023-01-20 NOTE — Telephone Encounter (Signed)
Called patient to schedule 6 month follow up. Patient stated her insurance is now out of network, so she can not continue heart care with [facility].   She states she is going to find a new office in her network and will callback to request a referral.

## 2023-03-11 ENCOUNTER — Ambulatory Visit: Payer: BLUE CROSS/BLUE SHIELD | Admitting: Nurse Practitioner

## 2023-03-16 ENCOUNTER — Emergency Department (HOSPITAL_COMMUNITY)
Admission: EM | Admit: 2023-03-16 | Discharge: 2023-03-16 | Disposition: A | Discharge status: Home / Self Care | Payer: BLUE CROSS/BLUE SHIELD | Attending: Emergency Medicine | Admitting: Emergency Medicine

## 2023-03-16 ENCOUNTER — Other Ambulatory Visit: Payer: Self-pay

## 2023-03-16 ENCOUNTER — Encounter (HOSPITAL_COMMUNITY): Payer: Self-pay

## 2023-03-16 DIAGNOSIS — Z79899 Other long term (current) drug therapy: Secondary | ICD-10-CM | POA: Diagnosis not present

## 2023-03-16 DIAGNOSIS — Z760 Encounter for issue of repeat prescription: Secondary | ICD-10-CM | POA: Insufficient documentation

## 2023-03-16 DIAGNOSIS — Z91040 Latex allergy status: Secondary | ICD-10-CM | POA: Diagnosis not present

## 2023-03-16 DIAGNOSIS — Z7982 Long term (current) use of aspirin: Secondary | ICD-10-CM | POA: Diagnosis not present

## 2023-03-16 MED ORDER — BUSPIRONE HCL 10 MG PO TABS: 10.0000 mg | ORAL_TABLET | Freq: Three times a day (TID) | ORAL | 1 refills | Status: DC

## 2023-03-16 MED ORDER — ESCITALOPRAM OXALATE 20 MG PO TABS: 20.0000 mg | ORAL_TABLET | Freq: Every day | ORAL | 1 refills | Status: DC

## 2023-03-16 MED ORDER — DIVALPROEX SODIUM ER 500 MG PO TB24: 500.0000 mg | ORAL_TABLET | Freq: Three times a day (TID) | ORAL | 1 refills | Status: DC

## 2023-03-16 NOTE — ED Provider Notes (Signed)
 [place] EMERGENCY DEPARTMENT AT [facility] Provider Note   CSN: [id number] Arrival date & time: 03/16/23  9056     History  Chief Complaint  Patient presents with   Medication Refill    [name] is a 58 y.o. female.  HPI 58 year old female presents requesting a refill of her psychiatric medicines.  She states she has been out of them for about 2 weeks and there has been a gap in her care as she has had to get a new behavioral health specialist.  She has an appointment on March 12.  She is requesting refill of her BuSpar , Lexapro , Depakote .  She has her other medications prescribed to her such as her hypertension meds.  She denies any acute complaints such as hallucinations, SI, etc.  Home Medications Prior to Admission medications   Medication Sig Start Date End Date Taking? Authorizing Provider  amLODipine  (NORVASC ) 10 MG tablet Take 1 tablet (10 mg total) by mouth daily. 05/27/22   Wyn Jackee VEAR Mickey., NP  aspirin  81 MG chewable tablet Chew 81 mg by mouth daily.    [provider]  busPIRone  (BUSPAR ) 10 MG tablet Take 1 tablet (10 mg total) by mouth 3 (three) times daily. 03/16/23   Freddi Hamilton, MD  carvedilol  (COREG ) 6.25 MG tablet Take 1 tablet (6.25 mg total) by mouth 2 (two) times daily. 05/27/22   Wyn Jackee VEAR Mickey., NP  clopidogrel  (PLAVIX ) 75 MG tablet Take 1 tablet (75 mg total) by mouth daily. 05/27/22   Wyn Jackee VEAR Mickey., NP  divalproex  (DEPAKOTE  ER) 500 MG 24 hr tablet Take 1 tablet (500 mg total) by mouth 3 (three) times daily. 03/16/23   Freddi Hamilton, MD  escitalopram  (LEXAPRO ) 20 MG tablet Take 1 tablet (20 mg total) by mouth daily. 03/16/23 05/15/23  Freddi Hamilton, MD  furosemide  (LASIX ) 20 MG tablet Take 1 tablet (20 mg total) by mouth daily. 05/27/22 05/22/23  Wyn Jackee VEAR Mickey., NP  losartan  (COZAAR ) 25 MG tablet Take 0.5 tablets (12.5 mg total) by mouth daily. 05/27/22   Wyn Jackee VEAR Mickey., NP  nitroGLYCERIN  (NITROSTAT ) 0.4 MG SL tablet Place  1 tablet (0.4 mg total) under the tongue every 5 (five) minutes x 3 doses as needed for chest pain. 05/27/22   Wyn Jackee VEAR Mickey., NP  rosuvastatin  (CRESTOR ) 40 MG tablet Take 1 tablet (40 mg total) by mouth daily. 05/27/22   Wyn Jackee VEAR Mickey., NP      Allergies    Lidocaine , Codeine, Percocet [oxycodone -acetaminophen ], Vicodin [hydrocodone -acetaminophen ], 5-alpha reductase inhibitors, Atorvastatin , and Latex    Review of Systems   Review of Systems  Constitutional:  Negative for fever.  Respiratory:  Negative for cough.   Psychiatric/Behavioral:  Negative for suicidal ideas.     Physical Exam Updated Vital Signs BP (!) 177/83 (BP Location: Right Arm)   Pulse 68   Temp 98 F (36.7 C) (Oral)   Resp 16   Ht 5' 1 (1.549 m)   Wt 74.8 kg   LMP  (LMP Unknown)   SpO2 99%   BMI 31.16 kg/m  Physical Exam Vitals and nursing note reviewed.  Constitutional:      Appearance: She is well-developed.  HENT:     Head: Normocephalic and atraumatic.  Cardiovascular:     Rate and Rhythm: Normal rate and regular rhythm.     Heart sounds: Normal heart sounds.  Pulmonary:     Effort: Pulmonary effort is normal.  Abdominal:  General: There is no distension.  Skin:    General: Skin is warm and dry.  Neurological:     Mental Status: She is alert.  Psychiatric:        Attention and Perception: She does not perceive auditory or visual hallucinations.        Behavior: Behavior is not agitated or aggressive.        Thought Content: Thought content does not include suicidal ideation.     ED Results / Procedures / Treatments   Labs (all labs ordered are listed, but only abnormal results are displayed) Labs Reviewed - No data to display  EKG None  Radiology No results found.  Procedures Procedures    Medications Ordered in ED Medications - No data to display  ED Course/ Medical Decision Making/ A&P                                 Medical Decision Making Risk Prescription  drug management.   Patient presents requesting a medication refill.  Will give a refill and she is due to see her behavioral health specialist in a little over a month.  She otherwise has no acute complaints.  No acute psychiatric emergency present.  Will discharge home with return precautions.        Final Clinical Impression(s) / ED Diagnoses Final diagnoses:  Medication refill    Rx / DC Orders ED Discharge Orders          Ordered    busPIRone  (BUSPAR ) 10 MG tablet  3 times daily        03/16/23 1120    divalproex  (DEPAKOTE  ER) 500 MG 24 hr tablet  3 times daily        03/16/23 1120    escitalopram  (LEXAPRO ) 20 MG tablet  Daily        03/16/23 1120              Freddi Hamilton, MD 03/16/23 1123

## 2023-03-16 NOTE — ED Triage Notes (Addendum)
 Patient reports that she is here for a refill on her bipolar medications. States she has an appointment with a new doctor but cannot get in to see them until 3/12 and is in need of her meds now. Pt reports that she has been out of her medications for about 2 weeks and is starting to feel like the paranoia is coming back, and wants to get back on her medications. No other complaints.

## 2023-05-29 ENCOUNTER — Other Ambulatory Visit: Payer: Self-pay | Admitting: Nurse Practitioner

## 2023-08-06 ENCOUNTER — Other Ambulatory Visit: Payer: Self-pay

## 2023-08-06 ENCOUNTER — Emergency Department (HOSPITAL_COMMUNITY)
Admission: EM | Admit: 2023-08-06 | Discharge: 2023-08-06 | Disposition: A | Discharge status: Home / Self Care | Payer: Self-pay | Attending: Emergency Medicine | Admitting: Emergency Medicine

## 2023-08-06 ENCOUNTER — Encounter (HOSPITAL_COMMUNITY): Payer: Self-pay

## 2023-08-06 DIAGNOSIS — Z7982 Long term (current) use of aspirin: Secondary | ICD-10-CM | POA: Insufficient documentation

## 2023-08-06 DIAGNOSIS — I10 Essential (primary) hypertension: Secondary | ICD-10-CM | POA: Insufficient documentation

## 2023-08-06 DIAGNOSIS — Z760 Encounter for issue of repeat prescription: Secondary | ICD-10-CM | POA: Insufficient documentation

## 2023-08-06 DIAGNOSIS — Z951 Presence of aortocoronary bypass graft: Secondary | ICD-10-CM | POA: Insufficient documentation

## 2023-08-06 DIAGNOSIS — Z91040 Latex allergy status: Secondary | ICD-10-CM | POA: Insufficient documentation

## 2023-08-06 DIAGNOSIS — Z79899 Other long term (current) drug therapy: Secondary | ICD-10-CM | POA: Insufficient documentation

## 2023-08-06 LAB — CBC WITH DIFFERENTIAL/PLATELET
Abs Immature Granulocytes: 0.01 10*3/uL (ref 0.00–0.07)
Basophils Absolute: 0.1 10*3/uL (ref 0.0–0.1)
Basophils Relative: 1 %
Eosinophils Absolute: 0.1 10*3/uL (ref 0.0–0.5)
Eosinophils Relative: 2 %
HCT: 41.1 % (ref 36.0–46.0)
Hemoglobin: 13 g/dL (ref 12.0–15.0)
Immature Granulocytes: 0 %
Lymphocytes Relative: 28 %
Lymphs Abs: 1.5 10*3/uL (ref 0.7–4.0)
MCH: 27.5 pg (ref 26.0–34.0)
MCHC: 31.6 g/dL (ref 30.0–36.0)
MCV: 87.1 fL (ref 80.0–100.0)
Monocytes Absolute: 0.5 10*3/uL (ref 0.1–1.0)
Monocytes Relative: 10 %
Neutro Abs: 3.1 10*3/uL (ref 1.7–7.7)
Neutrophils Relative %: 59 %
Platelets: 313 10*3/uL (ref 150–400)
RBC: 4.72 MIL/uL (ref 3.87–5.11)
RDW: 12.9 % (ref 11.5–15.5)
WBC: 5.3 10*3/uL (ref 4.0–10.5)
nRBC: 0 % (ref 0.0–0.2)

## 2023-08-06 LAB — COMPREHENSIVE METABOLIC PANEL WITH GFR
ALT: 14 U/L (ref 0–44)
AST: 20 U/L (ref 15–41)
Albumin: 3.9 g/dL (ref 3.5–5.0)
Alkaline Phosphatase: 59 U/L (ref 38–126)
Anion gap: 9 (ref 5–15)
BUN: 8 mg/dL (ref 6–20)
CO2: 24 mmol/L (ref 22–32)
Calcium: 9.1 mg/dL (ref 8.9–10.3)
Chloride: 105 mmol/L (ref 98–111)
Creatinine, Ser: 0.56 mg/dL (ref 0.44–1.00)
GFR, Estimated: >60 mL/min (ref >60)
Glucose, Bld: 101 mg/dL — ABNORMAL HIGH (ref 70–99)
Potassium: 3.5 mmol/L (ref 3.5–5.1)
Sodium: 138 mmol/L (ref 135–145)
Total Bilirubin: 0.8 mg/dL (ref 0.0–1.2)
Total Protein: 7.3 g/dL (ref 6.5–8.1)

## 2023-08-06 LAB — TROPONIN I (HIGH SENSITIVITY): Troponin I (High Sensitivity): 6 ng/L (ref <18)

## 2023-08-06 MED ORDER — CARVEDILOL 6.25 MG PO TABS: 6.2500 mg | ORAL_TABLET | Freq: Two times a day (BID) | ORAL | 0 refills | Status: DC

## 2023-08-06 MED ORDER — DIVALPROEX SODIUM 500 MG PO DR TAB
500.0000 mg | DELAYED_RELEASE_TABLET | Freq: Once | ORAL | Status: AC
  Administered 2023-08-06: 500 mg via ORAL
  Filled 2023-08-06: qty 1

## 2023-08-06 MED ORDER — HYDRALAZINE HCL 20 MG/ML IJ SOLN
5.0000 mg | Freq: Once | INTRAMUSCULAR | Status: AC
  Administered 2023-08-06: 5 mg via INTRAVENOUS
  Filled 2023-08-06: qty 1

## 2023-08-06 MED ORDER — DIVALPROEX SODIUM ER 500 MG PO TB24: 500.0000 mg | ORAL_TABLET | Freq: Three times a day (TID) | ORAL | 0 refills | Status: DC

## 2023-08-06 MED ORDER — SODIUM CHLORIDE 0.9 % IV BOLUS
1000.0000 mL | Freq: Once | INTRAVENOUS | Status: AC
  Administered 2023-08-06: 1000 mL via INTRAVENOUS

## 2023-08-06 MED ORDER — LOSARTAN POTASSIUM 25 MG PO TABS
12.5000 mg | ORAL_TABLET | Freq: Once | ORAL | Status: AC
  Administered 2023-08-06: 12.5 mg via ORAL
  Filled 2023-08-06: qty 1

## 2023-08-06 MED ORDER — AMLODIPINE BESYLATE 10 MG PO TABS: 10.0000 mg | ORAL_TABLET | Freq: Every day | ORAL | 0 refills | Status: DC

## 2023-08-06 MED ORDER — AMLODIPINE BESYLATE 5 MG PO TABS
10.0000 mg | ORAL_TABLET | Freq: Once | ORAL | Status: AC
  Administered 2023-08-06: 10 mg via ORAL
  Filled 2023-08-06: qty 2

## 2023-08-06 MED ORDER — CLOPIDOGREL BISULFATE 75 MG PO TABS: 75.0000 mg | ORAL_TABLET | Freq: Every day | ORAL | 0 refills | Status: AC

## 2023-08-06 MED ORDER — LOSARTAN POTASSIUM 25 MG PO TABS: 12.5000 mg | ORAL_TABLET | Freq: Every day | ORAL | 0 refills | Status: DC

## 2023-08-06 MED ORDER — ESCITALOPRAM OXALATE 20 MG PO TABS: 20.0000 mg | ORAL_TABLET | Freq: Every day | ORAL | 1 refills | Status: DC

## 2023-08-06 NOTE — ED Notes (Signed)
 Sent a request for IV team

## 2023-08-06 NOTE — ED Triage Notes (Signed)
 PT arrives via POV. Pt reports she is here because she has recently ran out of her depakote , lexapro  and her bp meds. PT arrives AxOx4. She is hypertensive, with a BP of 230/103. PT denies any associated symptoms in regards to her BP.

## 2023-08-06 NOTE — Discharge Instructions (Signed)
 As discussed, your labs are reassuring.  Follow-up with your PCP and cardiologist for further refills and further management of your diagnoses.  Get help right away if you: Develop a severe headache or confusion. Have unusual weakness or numbness. Feel faint. Have severe pain in your chest or abdomen. Vomit repeatedly. Have trouble breathing.

## 2023-08-06 NOTE — ED Provider Notes (Signed)
 [place] EMERGENCY DEPARTMENT AT [facility] Provider Note   CSN: [id number] Arrival date & time: 08/06/23  9068     Patient presents with: Hypertension and Medication Refill   [name] is a 58 y.o. female with a history of MI, cardiac arrest, CABG, and bipolar disorder who presents the ED today for a medication refill.  Patient reports that she has been on her Depakote , Lexapro , and her blood pressure medications for the past several weeks.  States that she is in between primary care providers due to an insurance change at her job.  In triage her blood pressure was 234/98.  Patient denies any associated chest pain, shortness of breath, headaches, or vision changes at this time.  States that symptoms at work when she is lifting things she will get intermittent episodes of chest tightness that are self-limiting.  Reports that she has been busy at work as well s and not eating or drinking a lot during the day.  She reports some lower abdominal cramping pains right before having episodes of diarrhea.  This been going on for the past week.  She is concerned she may be dehydrated.  No associated nausea, vomiting, or fevers.  No additional complaints or concerns at this time.    Prior to Admission medications   Medication Sig Start Date End Date Taking? Authorizing Provider  amLODipine  (NORVASC ) 10 MG tablet Take 1 tablet (10 mg total) by mouth daily. 08/06/23 09/05/23  Waddell Sluder, PA-C  aspirin  81 MG chewable tablet Chew 81 mg by mouth daily.    [provider]  busPIRone  (BUSPAR ) 10 MG tablet Take 1 tablet (10 mg total) by mouth 3 (three) times daily. 03/16/23   Freddi Hamilton, MD  carvedilol  (COREG ) 6.25 MG tablet Take 1 tablet (6.25 mg total) by mouth 2 (two) times daily. 08/06/23 09/05/23  Waddell Sluder, PA-C  clopidogrel  (PLAVIX ) 75 MG tablet Take 1 tablet (75 mg total) by mouth daily. 08/06/23 09/05/23  Waddell Sluder, PA-C  divalproex  (DEPAKOTE  ER) 500 MG 24 hr tablet Take  1 tablet (500 mg total) by mouth 3 (three) times daily. 08/06/23 09/05/23  Waddell Sluder, PA-C  escitalopram  (LEXAPRO ) 20 MG tablet Take 1 tablet (20 mg total) by mouth daily. 08/06/23 10/05/23  Waddell Sluder, PA-C  furosemide  (LASIX ) 20 MG tablet TAKE 1 TABLET BY MOUTH EVERY DAY 05/31/23   Wyn Jackee VEAR Mickey., NP  losartan  (COZAAR ) 25 MG tablet Take 0.5 tablets (12.5 mg total) by mouth daily. 08/06/23 09/05/23  Waddell Sluder, PA-C  nitroGLYCERIN  (NITROSTAT ) 0.4 MG SL tablet Place 1 tablet (0.4 mg total) under the tongue every 5 (five) minutes x 3 doses as needed for chest pain. 05/27/22   Wyn Jackee VEAR Mickey., NP  rosuvastatin  (CRESTOR ) 40 MG tablet TAKE 1 TABLET BY MOUTH EVERY DAY 05/31/23   Wyn Jackee VEAR Mickey., NP    Allergies: Lidocaine , Codeine, Percocet [oxycodone -acetaminophen ], Vicodin [hydrocodone -acetaminophen ], 5-alpha reductase inhibitors, Atorvastatin , and Latex    Review of Systems  Gastrointestinal:  Positive for diarrhea.  All other systems reviewed and are negative.   Updated Vital Signs BP (!) 142/100   Pulse 65   Temp 98.6 F (37 C) (Oral)   Resp 18   LMP  (LMP Unknown)   SpO2 100%   Physical Exam Vitals and nursing note reviewed.  Constitutional:      General: She is not in acute distress.    Appearance: Normal appearance.  HENT:     Head: Normocephalic and atraumatic.  Mouth/Throat:     Mouth: Mucous membranes are moist.   Eyes:     Conjunctiva/sclera: Conjunctivae normal.     Pupils: Pupils are equal, round, and reactive to light.    Cardiovascular:     Rate and Rhythm: Normal rate and regular rhythm.     Pulses: Normal pulses.     Heart sounds: Normal heart sounds.  Pulmonary:     Effort: Pulmonary effort is normal.     Breath sounds: Normal breath sounds.  Abdominal:     Palpations: Abdomen is soft.     Tenderness: There is no abdominal tenderness.   Musculoskeletal:        General: Normal range of motion.     Cervical back: Normal range of motion.      Right lower leg: No edema.     Left lower leg: No edema.   Skin:    General: Skin is warm and dry.     Findings: No rash.   Neurological:     General: No focal deficit present.     Mental Status: She is alert.     Sensory: No sensory deficit.     Motor: No weakness.   Psychiatric:        Mood and Affect: Mood normal.        Behavior: Behavior normal.    (all labs ordered are listed, but only abnormal results are displayed) Labs Reviewed  COMPREHENSIVE METABOLIC PANEL WITH GFR - Abnormal; Notable for the following components:      Result Value   Glucose, Bld 101 (*)    All other components within normal limits  CBC WITH DIFFERENTIAL/PLATELET  TROPONIN I (HIGH SENSITIVITY)    EKG: EKG Interpretation Date/Time:  Saturday August 06 2023 10:55:01 EDT Ventricular Rate:  51 PR Interval:  140 QRS Duration:  125 QT Interval:  461 QTC Calculation: 425 R Axis:   0  Text Interpretation: Sinus rhythm Probable left atrial enlargement Left ventricular hypertrophy Inferior infarct, age indeterminate Anterior Q waves, possibly due to LVH Lateral leads are also involved Confirmed by Lenor Hollering [id number]) on 08/06/2023 11:43:07 AM  Radiology: No results found.   Procedures   Medications Ordered in the ED  hydrALAZINE  (APRESOLINE ) injection 5 mg (has no administration in time range)  divalproex  (DEPAKOTE ) DR tablet 500 mg (500 mg Oral Given 08/06/23 1018)  amLODipine  (NORVASC ) tablet 10 mg (10 mg Oral Given 08/06/23 1019)  losartan  (COZAAR ) tablet 12.5 mg (12.5 mg Oral Given by Other 08/06/23 1057)  sodium chloride  0.9 % bolus 1,000 mL (0 mLs Intravenous Stopped 08/06/23 1540)                                    Medical Decision Making Amount and/or Complexity of Data Reviewed Labs: ordered.  Risk Prescription drug management.   This patient presents to the ED for concern of medication refill/hypertension, this involves an extensive number of treatment options, and is a  complaint that carries with it a high risk of complications and morbidity.   Differential diagnosis includes: hypertensive emergency, hypertensive urgency, gastroenteritis, IBS, IBD, dehydration, AKI, electrolyte abnormality, etc.   Comorbidities  See HPI above   Additional History  Additional history obtained from prior records   Cardiac Monitoring / EKG  The patient was maintained on a cardiac monitor.  I personally viewed and interpreted the cardiac monitored which showed: sinus rhythm with a heart rate of  51 bpm.   Lab Tests  I ordered and personally interpreted labs.  The pertinent results include:   Negative troponin CMP and CBC are unremarkable   Problem List / ED Course / Critical Interventions / Medication Management  Patient came in initially today for medication refill and ended up having elevated blood pressure.  Blood pressure in triage was 234/98.  No associated chest pain, shortness of breath, headaches, or vision changes.  No weakness. States that she has not been taking her blood pressure medications for the past several months.  Has been out of her Depakote  for the past several days and is requesting refill at this time. I ordered medications including: Losartan  and Amlodipine  for hypertension Depakote  for bipolar disorder Reevaluation of the patient after these medicines showed that the patient improved to 142/100. Fluids given per patient's request. BP was in the 170s prior to discharge. Hydralazine  given prior to discharge as well. Blood pressure medications and Depakote  sent to the pharmacy. Patient to monitor blood pressures at home and to follow-up with her primary care provider for further evaluation and management of her blood pressure.   Social Determinants of Health  Access to healthcare   Test / Admission - Considered  Patient is stable and safe for discharge home. Return precautions given.    Final diagnoses:  Encounter for medication  refill  Hypertension, unspecified type    ED Discharge Orders          Ordered    amLODipine  (NORVASC ) 10 MG tablet  Daily       Note to Pharmacy: Pt must schedule an overdue followup appt with Cardiology for any more refills. [phone] 1st attempt Thank You   08/06/23 1314    carvedilol  (COREG ) 6.25 MG tablet  2 times daily       Note to Pharmacy: Pt must schedule an overdue followup appt with Cardiology for any more refills. [phone] 1st attempt Thank You   08/06/23 1314    clopidogrel  (PLAVIX ) 75 MG tablet  Daily        08/06/23 1314    divalproex  (DEPAKOTE  ER) 500 MG 24 hr tablet  3 times daily        08/06/23 1314    escitalopram  (LEXAPRO ) 20 MG tablet  Daily        08/06/23 1314    losartan  (COZAAR ) 25 MG tablet  Daily       Note to Pharmacy: Pt must schedule an overdue followup appt with Cardiology for any more refills. [phone] 1st attempt Thank You   08/06/23 1314               Waddell Sluder, PA-C 08/06/23 1543    Lenor Hollering, MD 08/07/23 [phone]

## 2023-10-17 ENCOUNTER — Emergency Department (HOSPITAL_COMMUNITY)
Admission: EM | Admit: 2023-10-17 | Discharge: 2023-10-17 | Disposition: A | Discharge status: Home / Self Care | Payer: Self-pay | Attending: Emergency Medicine | Admitting: Emergency Medicine

## 2023-10-17 ENCOUNTER — Encounter (HOSPITAL_COMMUNITY): Payer: Self-pay

## 2023-10-17 ENCOUNTER — Other Ambulatory Visit: Payer: Self-pay

## 2023-10-17 DIAGNOSIS — M25512 Pain in left shoulder: Secondary | ICD-10-CM | POA: Insufficient documentation

## 2023-10-17 DIAGNOSIS — Z7982 Long term (current) use of aspirin: Secondary | ICD-10-CM | POA: Insufficient documentation

## 2023-10-17 DIAGNOSIS — Z91040 Latex allergy status: Secondary | ICD-10-CM | POA: Insufficient documentation

## 2023-10-17 DIAGNOSIS — Z760 Encounter for issue of repeat prescription: Secondary | ICD-10-CM | POA: Insufficient documentation

## 2023-10-17 DIAGNOSIS — Z79899 Other long term (current) drug therapy: Secondary | ICD-10-CM | POA: Insufficient documentation

## 2023-10-17 DIAGNOSIS — I10 Essential (primary) hypertension: Secondary | ICD-10-CM | POA: Insufficient documentation

## 2023-10-17 MED ORDER — AMLODIPINE BESYLATE 10 MG PO TABS: 10.0000 mg | ORAL_TABLET | Freq: Every day | ORAL | 0 refills | Status: DC

## 2023-10-17 MED ORDER — LOSARTAN POTASSIUM 25 MG PO TABS: 12.5000 mg | ORAL_TABLET | Freq: Every day | ORAL | 0 refills | Status: AC

## 2023-10-17 MED ORDER — IPRATROPIUM-ALBUTEROL 0.5-2.5 (3) MG/3ML IN SOLN: 3.0000 mL | Freq: Once | RESPIRATORY_TRACT | Status: DC

## 2023-10-17 MED ORDER — BUSPIRONE HCL 10 MG PO TABS: 10.0000 mg | ORAL_TABLET | Freq: Three times a day (TID) | ORAL | 1 refills | Status: DC

## 2023-10-17 MED ORDER — CARVEDILOL 6.25 MG PO TABS: 6.2500 mg | ORAL_TABLET | Freq: Two times a day (BID) | ORAL | 0 refills | Status: DC

## 2023-10-17 MED ORDER — ESCITALOPRAM OXALATE 20 MG PO TABS: 20.0000 mg | ORAL_TABLET | Freq: Every day | ORAL | 1 refills | Status: DC

## 2023-10-17 MED ORDER — DIVALPROEX SODIUM ER 500 MG PO TB24: 500.0000 mg | ORAL_TABLET | Freq: Three times a day (TID) | ORAL | 0 refills | Status: DC

## 2023-10-17 NOTE — ED Notes (Signed)
Langston Masker, MD at bedside.
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
## 2023-10-17 NOTE — Discharge Instructions (Addendum)
 I represcribed for 30 days your medications including your blood pressure medicines.  Please follow-up with your new provider in 3 weeks for your first appointment, and let them know that you will need further refills for your medications.  Your primary care outpatient provider can continue to provide this.  You can call to make an appointment with an orthopedic clinic for your left shoulder pain.  You can either make an appointment with your prior orthopedic doctor, Dr Celena, or the on-call orthopedic surgeon, Dr. Jerri.  You should call their offices and schedule an appointment and discuss your insurance plan.

## 2023-10-17 NOTE — ED Triage Notes (Signed)
 States she needs her pysch meds refilled can't see her doctor until next week.

## 2023-12-24 ENCOUNTER — Emergency Department (HOSPITAL_COMMUNITY)
Admission: EM | Admit: 2023-12-24 | Discharge: 2023-12-24 | Disposition: A | Discharge status: Home / Self Care | Payer: Self-pay | Attending: Emergency Medicine | Admitting: Emergency Medicine

## 2023-12-24 ENCOUNTER — Other Ambulatory Visit: Payer: Self-pay

## 2023-12-24 DIAGNOSIS — Z720 Tobacco use: Secondary | ICD-10-CM | POA: Insufficient documentation

## 2023-12-24 DIAGNOSIS — M25512 Pain in left shoulder: Secondary | ICD-10-CM | POA: Insufficient documentation

## 2023-12-24 DIAGNOSIS — G8929 Other chronic pain: Secondary | ICD-10-CM | POA: Insufficient documentation

## 2023-12-24 DIAGNOSIS — Z91040 Latex allergy status: Secondary | ICD-10-CM | POA: Insufficient documentation

## 2023-12-24 DIAGNOSIS — I110 Hypertensive heart disease with heart failure: Secondary | ICD-10-CM | POA: Insufficient documentation

## 2023-12-24 DIAGNOSIS — Z760 Encounter for issue of repeat prescription: Secondary | ICD-10-CM | POA: Insufficient documentation

## 2023-12-24 DIAGNOSIS — Z7982 Long term (current) use of aspirin: Secondary | ICD-10-CM | POA: Insufficient documentation

## 2023-12-24 DIAGNOSIS — I5032 Chronic diastolic (congestive) heart failure: Secondary | ICD-10-CM | POA: Insufficient documentation

## 2023-12-24 DIAGNOSIS — Z79899 Other long term (current) drug therapy: Secondary | ICD-10-CM | POA: Insufficient documentation

## 2023-12-24 DIAGNOSIS — I2510 Atherosclerotic heart disease of native coronary artery without angina pectoris: Secondary | ICD-10-CM | POA: Insufficient documentation

## 2023-12-24 DIAGNOSIS — Z951 Presence of aortocoronary bypass graft: Secondary | ICD-10-CM | POA: Insufficient documentation

## 2023-12-24 MED ORDER — ESCITALOPRAM OXALATE 20 MG PO TABS: 20.0000 mg | ORAL_TABLET | Freq: Every day | ORAL | 1 refills | Status: DC

## 2023-12-24 MED ORDER — BUSPIRONE HCL 10 MG PO TABS: 10.0000 mg | ORAL_TABLET | Freq: Three times a day (TID) | ORAL | 1 refills | Status: AC

## 2023-12-24 MED ORDER — AMLODIPINE BESYLATE 10 MG PO TABS: 10.0000 mg | ORAL_TABLET | Freq: Every day | ORAL | 0 refills | Status: DC

## 2023-12-24 MED ORDER — DIVALPROEX SODIUM ER 500 MG PO TB24: 500.0000 mg | ORAL_TABLET | Freq: Three times a day (TID) | ORAL | 0 refills | Status: DC

## 2023-12-24 MED ORDER — CARVEDILOL 6.25 MG PO TABS: 6.2500 mg | ORAL_TABLET | Freq: Two times a day (BID) | ORAL | 0 refills | Status: AC

## 2023-12-24 NOTE — Discharge Instructions (Signed)
 You were evaluated in the emergency room for medication refill and left shoulder pain.  Refill of your medication was sent to your pharmacy.  Please call the number that sheet to follow-up with your primary care doctor.  You additionally provided a referral for orthopedics.  Please call make an appointment at your earliest convenience.
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
## 2024-02-13 ENCOUNTER — Emergency Department (HOSPITAL_COMMUNITY)
Admission: EM | Admit: 2024-02-13 | Discharge: 2024-02-13 | Disposition: A | Discharge status: Home / Self Care | Payer: Self-pay | Attending: Emergency Medicine | Admitting: Emergency Medicine

## 2024-02-13 DIAGNOSIS — Z79899 Other long term (current) drug therapy: Secondary | ICD-10-CM | POA: Insufficient documentation

## 2024-02-13 DIAGNOSIS — Z91040 Latex allergy status: Secondary | ICD-10-CM | POA: Insufficient documentation

## 2024-02-13 DIAGNOSIS — Z760 Encounter for issue of repeat prescription: Secondary | ICD-10-CM | POA: Insufficient documentation

## 2024-02-13 DIAGNOSIS — Z7982 Long term (current) use of aspirin: Secondary | ICD-10-CM | POA: Insufficient documentation

## 2024-02-13 DIAGNOSIS — I2510 Atherosclerotic heart disease of native coronary artery without angina pectoris: Secondary | ICD-10-CM | POA: Insufficient documentation

## 2024-02-13 DIAGNOSIS — I10 Essential (primary) hypertension: Secondary | ICD-10-CM | POA: Insufficient documentation

## 2024-02-13 MED ORDER — ESCITALOPRAM OXALATE 20 MG PO TABS: 20.0000 mg | ORAL_TABLET | Freq: Every day | ORAL | 1 refills | Status: AC

## 2024-02-13 MED ORDER — AMLODIPINE BESYLATE 10 MG PO TABS: 10.0000 mg | ORAL_TABLET | Freq: Every day | ORAL | 0 refills | Status: AC

## 2024-02-13 MED ORDER — DIVALPROEX SODIUM ER 500 MG PO TB24: 500.0000 mg | ORAL_TABLET | Freq: Three times a day (TID) | ORAL | 0 refills | Status: AC

## 2024-02-13 NOTE — ED Provider Notes (Signed)
 " [place] EMERGENCY DEPARTMENT AT [facility] Provider Note   CSN: [id number] Arrival date & time: 02/13/24  1321     Patient presents with: Medication Refill   [name] is a 59 y.o. female.  Patient is a 59 year old female with history of CAD, hypertension, bipolar disorder who presents to the ED requesting refills on Depakote , Lexapro , and amlodipine .  Notes she has been on this medication since approximately 2007.  She is in the process of getting a new doctor and does have an appointment but needs refills in the meantime.  States she has been on her medication for approximately 1 week.  Notes she has felt more agitated but denies SI/HI.  Denies chest pain or shortness of breath.  No further complaints.    Medication Refill      Prior to Admission medications  Medication Sig Start Date End Date Taking? Authorizing Provider  amLODipine  (NORVASC ) 10 MG tablet Take 1 tablet (10 mg total) by mouth daily. 02/13/24 03/14/24  Neysa Thersia RAMAN, PA-C  aspirin  81 MG chewable tablet Chew 81 mg by mouth daily.    [provider]  busPIRone  (BUSPAR ) 10 MG tablet Take 1 tablet (10 mg total) by mouth 3 (three) times daily. 12/24/23   Donnajean Lynwood DEL, PA-C  carvedilol  (COREG ) 6.25 MG tablet Take 1 tablet (6.25 mg total) by mouth 2 (two) times daily. 12/24/23 01/23/24  Donnajean Lynwood DEL, PA-C  divalproex  (DEPAKOTE  ER) 500 MG 24 hr tablet Take 1 tablet (500 mg total) by mouth 3 (three) times daily. 02/13/24 03/14/24  Neysa Thersia RAMAN, PA-C  escitalopram  (LEXAPRO ) 20 MG tablet Take 1 tablet (20 mg total) by mouth daily. 02/13/24 04/13/24  Neysa Thersia RAMAN, PA-C  furosemide  (LASIX ) 20 MG tablet TAKE 1 TABLET BY MOUTH EVERY DAY 05/31/23   Wyn Jackee DEL Mickey., NP  losartan  (COZAAR ) 25 MG tablet Take 0.5 tablets (12.5 mg total) by mouth daily. 10/17/23 11/16/23  Cottie Donnice PARAS, MD  nitroGLYCERIN  (NITROSTAT ) 0.4 MG SL tablet Place 1 tablet (0.4 mg total) under the tongue every 5 (five) minutes  x 3 doses as needed for chest pain. 05/27/22   Wyn Jackee DEL Mickey., NP  rosuvastatin  (CRESTOR ) 40 MG tablet TAKE 1 TABLET BY MOUTH EVERY DAY 05/31/23   Wyn Jackee DEL Mickey., NP    Allergies: Lidocaine , Codeine, Percocet [oxycodone -acetaminophen ], Vicodin [hydrocodone -acetaminophen ], 5-alpha reductase inhibitors, Atorvastatin , and Latex    Review of Systems  Respiratory:  Negative for shortness of breath.   Cardiovascular:  Negative for chest pain.  Psychiatric/Behavioral:  Negative for behavioral problems, confusion and suicidal ideas.   All other systems reviewed and are negative.   Updated Vital Signs BP (!) 140/87   Pulse 61   Temp 98.2 F (36.8 C) (Oral)   Resp 18   LMP  (LMP Unknown)   SpO2 100%   Physical Exam Constitutional:      Appearance: Normal appearance.  HENT:     Head: Normocephalic and atraumatic.     Nose: Nose normal.  Cardiovascular:     Rate and Rhythm: Normal rate.  Pulmonary:     Effort: Pulmonary effort is normal.  Skin:    General: Skin is warm and dry.  Neurological:     Mental Status: She is alert and oriented to person, place, and time.  Psychiatric:        Mood and Affect: Mood normal.        Behavior: Behavior normal.     (all labs  ordered are listed, but only abnormal results are displayed) Labs Reviewed - No data to display  EKG: None  Radiology: No results found.    Medications Ordered in the ED - No data to display                               Medical Decision Making Patient is a 59 year old female who presents to the ED requesting medication refills on Depakote , Lexapro , and amlodipine .  Notes has been on for several years and recently ran out a week ago.  She does have a new doctor appointment scheduled in the next month.  On exam patient is alert and well-appearing.  Physical exam as noted above.  No acute behavioral problems noted at this time including no SI/HI.  Stable for discharge.  Prescribed refills on Depakote , Lexapro ,  amlodipine .  Advised to continue taking as prescribed and follow-up for further medication management with PCP.  Return precautions provided for any symptoms.  Risk Prescription drug management.       Final diagnoses:  Medication refill    ED Discharge Orders          Ordered    amLODipine  (NORVASC ) 10 MG tablet  Daily       Note to Pharmacy: Pt must schedule an overdue followup appt with Cardiology for any more refills. [phone] 1st attempt Thank You   02/13/24 1345    divalproex  (DEPAKOTE  ER) 500 MG 24 hr tablet  3 times daily        02/13/24 1345    escitalopram  (LEXAPRO ) 20 MG tablet  Daily        02/13/24 1345               Neysa Thersia RAMAN, [place] 02/13/24 1407    Cottie Donnice PARAS, MD 02/13/24 1705  "

## 2024-02-13 NOTE — ED Triage Notes (Addendum)
 Patient reports she needs a refill of her depakote . Last taken 4 days ago. Patient denies any symptoms. Patient is alert and oriented x 4. Airway patent, respirations even and unlabored. Skin normal, warm and dry.

## 2024-02-13 NOTE — Discharge Instructions (Signed)
 Continue medications as prescribed.  Follow-up with doctor as scheduled for continued refills.  Return to ED for any worsening symptoms.
